# Patient Record
Sex: Male | Born: 1955
Health system: Southern US, Community
[De-identification: ages and names within clinical notes are randomized; demographics above are authoritative.]

## PROBLEM LIST (undated history)

## (undated) DIAGNOSIS — D369 Benign neoplasm, unspecified site: Secondary | ICD-10-CM

## (undated) DIAGNOSIS — I10 Essential (primary) hypertension: Secondary | ICD-10-CM

## (undated) DIAGNOSIS — Z8669 Personal history of other diseases of the nervous system and sense organs: Secondary | ICD-10-CM

## (undated) DIAGNOSIS — L259 Unspecified contact dermatitis, unspecified cause: Secondary | ICD-10-CM

## (undated) DIAGNOSIS — G4733 Obstructive sleep apnea (adult) (pediatric): Secondary | ICD-10-CM

## (undated) DIAGNOSIS — E739 Lactose intolerance, unspecified: Secondary | ICD-10-CM

## (undated) DIAGNOSIS — E538 Deficiency of other specified B group vitamins: Secondary | ICD-10-CM

## (undated) DIAGNOSIS — M5417 Radiculopathy, lumbosacral region: Secondary | ICD-10-CM

## (undated) DIAGNOSIS — Z9109 Other allergy status, other than to drugs and biological substances: Secondary | ICD-10-CM

## (undated) DIAGNOSIS — K760 Fatty (change of) liver, not elsewhere classified: Secondary | ICD-10-CM

## (undated) DIAGNOSIS — T4145XA Adverse effect of unspecified anesthetic, initial encounter: Secondary | ICD-10-CM

## (undated) DIAGNOSIS — H269 Unspecified cataract: Secondary | ICD-10-CM

## (undated) DIAGNOSIS — T8859XA Other complications of anesthesia, initial encounter: Secondary | ICD-10-CM

## (undated) DIAGNOSIS — I2510 Atherosclerotic heart disease of native coronary artery without angina pectoris: Secondary | ICD-10-CM

## (undated) DIAGNOSIS — R7303 Prediabetes: Secondary | ICD-10-CM

## (undated) DIAGNOSIS — I471 Supraventricular tachycardia: Secondary | ICD-10-CM

## (undated) DIAGNOSIS — J45909 Unspecified asthma, uncomplicated: Secondary | ICD-10-CM

## (undated) DIAGNOSIS — K5790 Diverticulosis of intestine, part unspecified, without perforation or abscess without bleeding: Secondary | ICD-10-CM

## (undated) DIAGNOSIS — E663 Overweight: Secondary | ICD-10-CM

## (undated) DIAGNOSIS — K225 Diverticulum of esophagus, acquired: Secondary | ICD-10-CM

## (undated) DIAGNOSIS — I714 Abdominal aortic aneurysm, without rupture: Secondary | ICD-10-CM

## (undated) DIAGNOSIS — M4800 Spinal stenosis, site unspecified: Secondary | ICD-10-CM

## (undated) DIAGNOSIS — J449 Chronic obstructive pulmonary disease, unspecified: Secondary | ICD-10-CM

## (undated) DIAGNOSIS — S8262XA Displaced fracture of lateral malleolus of left fibula, initial encounter for closed fracture: Secondary | ICD-10-CM

## (undated) DIAGNOSIS — Z789 Other specified health status: Secondary | ICD-10-CM

## (undated) DIAGNOSIS — C679 Malignant neoplasm of bladder, unspecified: Secondary | ICD-10-CM

## (undated) DIAGNOSIS — E118 Type 2 diabetes mellitus with unspecified complications: Secondary | ICD-10-CM

## (undated) DIAGNOSIS — R519 Headache, unspecified: Secondary | ICD-10-CM

## (undated) DIAGNOSIS — R51 Headache: Secondary | ICD-10-CM

## (undated) HISTORY — DX: Deficiency of other specified B group vitamins: E53.8

## (undated) HISTORY — DX: Type 2 diabetes mellitus with unspecified complications: E11.8

## (undated) HISTORY — DX: Diverticulum of esophagus, acquired: K22.5

## (undated) HISTORY — DX: Personal history of other diseases of the nervous system and sense organs: Z86.69

## (undated) HISTORY — DX: Displaced fracture of lateral malleolus of left fibula, initial encounter for closed fracture: S82.62XA

## (undated) HISTORY — DX: Unspecified asthma, uncomplicated: J45.909

## (undated) HISTORY — PX: VASECTOMY: SHX75

## (undated) HISTORY — DX: Unspecified contact dermatitis, unspecified cause: L25.9

## (undated) HISTORY — DX: Obstructive sleep apnea (adult) (pediatric): G47.33

## (undated) HISTORY — DX: Abdominal aortic aneurysm, without rupture: I71.4

## (undated) HISTORY — PX: CATARACT EXTRACTION W/ INTRAOCULAR LENS IMPLANT: SHX1309

## (undated) HISTORY — DX: Lactose intolerance, unspecified: E73.9

## (undated) HISTORY — DX: Other allergy status, other than to drugs and biological substances: Z91.09

## (undated) HISTORY — PX: EPIDURAL BLOCK INJECTION: SHX1516

## (undated) HISTORY — DX: Prediabetes: R73.03

## (undated) HISTORY — DX: Chronic obstructive pulmonary disease, unspecified: J44.9

## (undated) HISTORY — DX: Overweight: E66.3

## (undated) HISTORY — DX: Atherosclerotic heart disease of native coronary artery without angina pectoris: I25.10

## (undated) HISTORY — DX: Spinal stenosis, site unspecified: M48.00

## (undated) HISTORY — DX: Benign neoplasm, unspecified site: D36.9

## (undated) HISTORY — DX: Diverticulosis of intestine, part unspecified, without perforation or abscess without bleeding: K57.90

## (undated) HISTORY — DX: Supraventricular tachycardia: I47.1

## (undated) HISTORY — DX: Fatty (change of) liver, not elsewhere classified: K76.0

## (undated) HISTORY — DX: Radiculopathy, lumbosacral region: M54.17

## (undated) HISTORY — DX: Unspecified cataract: H26.9

## (undated) HISTORY — DX: Malignant neoplasm of bladder, unspecified: C67.9

## (undated) HISTORY — DX: Other specified health status: Z78.9

## (undated) HISTORY — PX: REPLACEMENT TOTAL HIP W/  RESURFACING IMPLANTS: SUR1222

---
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
## 2004-04-25 ENCOUNTER — Emergency Department: Payer: Self-pay | Admitting: Emergency Medicine

## 2008-07-26 ENCOUNTER — Ambulatory Visit: Payer: Self-pay | Admitting: Urology

## 2008-08-15 ENCOUNTER — Ambulatory Visit: Payer: Self-pay

## 2008-08-16 DIAGNOSIS — C679 Malignant neoplasm of bladder, unspecified: Secondary | ICD-10-CM

## 2008-08-16 HISTORY — PX: CYSTOSCOPY: SUR368

## 2008-08-16 HISTORY — DX: Malignant neoplasm of bladder, unspecified: C67.9

## 2009-05-15 ENCOUNTER — Encounter (INDEPENDENT_AMBULATORY_CARE_PROVIDER_SITE_OTHER): Payer: Self-pay | Admitting: *Deleted

## 2009-06-10 ENCOUNTER — Encounter (INDEPENDENT_AMBULATORY_CARE_PROVIDER_SITE_OTHER): Payer: Self-pay

## 2009-06-11 ENCOUNTER — Ambulatory Visit: Payer: Self-pay | Admitting: Gastroenterology

## 2009-06-11 DIAGNOSIS — D369 Benign neoplasm, unspecified site: Secondary | ICD-10-CM

## 2009-06-11 HISTORY — DX: Benign neoplasm, unspecified site: D36.9

## 2009-06-23 ENCOUNTER — Ambulatory Visit: Payer: Self-pay | Admitting: Gastroenterology

## 2009-06-25 ENCOUNTER — Encounter: Payer: Self-pay | Admitting: Gastroenterology

## 2011-01-15 ENCOUNTER — Other Ambulatory Visit: Payer: Self-pay | Admitting: Otolaryngology

## 2011-01-15 DIAGNOSIS — H9209 Otalgia, unspecified ear: Secondary | ICD-10-CM

## 2011-01-15 DIAGNOSIS — M542 Cervicalgia: Secondary | ICD-10-CM

## 2011-01-19 ENCOUNTER — Ambulatory Visit
Admission: RE | Admit: 2011-01-19 | Discharge: 2011-01-19 | Disposition: A | Discharge status: Home / Self Care | Payer: BC Managed Care – PPO | Source: Ambulatory Visit | Attending: Otolaryngology | Admitting: Otolaryngology

## 2011-01-19 DIAGNOSIS — H9209 Otalgia, unspecified ear: Secondary | ICD-10-CM

## 2011-01-19 DIAGNOSIS — M542 Cervicalgia: Secondary | ICD-10-CM

## 2011-01-19 MED ORDER — IOHEXOL 300 MG/ML  SOLN
75.0000 mL | Freq: Once | INTRAMUSCULAR | Status: AC | PRN
  Administered 2011-01-19: 75 mL via INTRAVENOUS

## 2011-02-03 ENCOUNTER — Other Ambulatory Visit: Payer: Self-pay | Admitting: Otolaryngology

## 2011-07-13 DIAGNOSIS — K5790 Diverticulosis of intestine, part unspecified, without perforation or abscess without bleeding: Secondary | ICD-10-CM

## 2011-07-13 HISTORY — DX: Diverticulosis of intestine, part unspecified, without perforation or abscess without bleeding: K57.90

## 2012-01-04 LAB — TSH: TSH: 1.591

## 2012-01-05 LAB — LIPID PANEL
Cholesterol, Total: 170
Direct LDL: 105
HDL: 37 mg/dL (ref 35–70)
Triglycerides: 141

## 2012-02-23 ENCOUNTER — Telehealth: Payer: Self-pay | Admitting: Gastroenterology

## 2012-02-23 NOTE — Telephone Encounter (Signed)
Pt has hemorrhoids and has been doing a lot of heavy lifting and now has BRB independent of stool.  No other GI symptoms.  Pt due for colon 06/2012.  Last colon 06/2009 Two colon polyps, both removed and sent to pathology (one was >1cm) 2) Moderate diverticulosis throughout colon, most significant in left colon 3) Otherwise normal examination No blood this morning last episode was last night.  Pt has been advised to do sitz baths as often as possible, use tucks pads, and prep H.  Pt was also advised to increase fiber and fluids.  He was given appt for 03/24/12 with Dr Christella Hartigan.  He will call if bleeding continues or worsens.  He will call if he develops dizziness,abd pain, or fever.  I will forward to Dr Christella Hartigan for further recommendations.

## 2012-02-23 NOTE — Telephone Encounter (Signed)
i agree, thanks 

## 2012-03-12 DIAGNOSIS — M5417 Radiculopathy, lumbosacral region: Secondary | ICD-10-CM

## 2012-03-12 HISTORY — DX: Radiculopathy, lumbosacral region: M54.17

## 2012-03-24 ENCOUNTER — Encounter: Payer: Self-pay | Admitting: Gastroenterology

## 2012-03-24 ENCOUNTER — Ambulatory Visit (INDEPENDENT_AMBULATORY_CARE_PROVIDER_SITE_OTHER): Payer: BC Managed Care – PPO | Admitting: Gastroenterology

## 2012-03-24 ENCOUNTER — Other Ambulatory Visit (INDEPENDENT_AMBULATORY_CARE_PROVIDER_SITE_OTHER): Payer: BC Managed Care – PPO

## 2012-03-24 ENCOUNTER — Inpatient Hospital Stay (HOSPITAL_COMMUNITY)
Admission: AD | Admit: 2012-03-24 | Discharge: 2012-03-26 | DRG: 111 | Disposition: A | Discharge status: Home / Self Care | Payer: BC Managed Care – PPO | Source: Ambulatory Visit | Attending: Internal Medicine | Admitting: Internal Medicine

## 2012-03-24 ENCOUNTER — Encounter (HOSPITAL_COMMUNITY): Payer: Self-pay | Admitting: Family Medicine

## 2012-03-24 ENCOUNTER — Ambulatory Visit (INDEPENDENT_AMBULATORY_CARE_PROVIDER_SITE_OTHER)
Admission: RE | Admit: 2012-03-24 | Discharge: 2012-03-24 | Disposition: A | Discharge status: Home / Self Care | Payer: BC Managed Care – PPO | Source: Ambulatory Visit | Attending: Gastroenterology | Admitting: Gastroenterology

## 2012-03-24 VITALS — BP 106/72 | HR 64 | Ht 70.0 in | Wt 186.0 lb

## 2012-03-24 DIAGNOSIS — I739 Peripheral vascular disease, unspecified: Secondary | ICD-10-CM | POA: Diagnosis present

## 2012-03-24 DIAGNOSIS — R109 Unspecified abdominal pain: Secondary | ICD-10-CM

## 2012-03-24 DIAGNOSIS — I719 Aortic aneurysm of unspecified site, without rupture: Secondary | ICD-10-CM

## 2012-03-24 DIAGNOSIS — Z8571 Personal history of Hodgkin lymphoma: Secondary | ICD-10-CM

## 2012-03-24 DIAGNOSIS — D369 Benign neoplasm, unspecified site: Secondary | ICD-10-CM

## 2012-03-24 DIAGNOSIS — K649 Unspecified hemorrhoids: Secondary | ICD-10-CM | POA: Diagnosis present

## 2012-03-24 DIAGNOSIS — K625 Hemorrhage of anus and rectum: Secondary | ICD-10-CM

## 2012-03-24 DIAGNOSIS — Z720 Tobacco use: Secondary | ICD-10-CM

## 2012-03-24 DIAGNOSIS — R634 Abnormal weight loss: Secondary | ICD-10-CM

## 2012-03-24 DIAGNOSIS — I714 Abdominal aortic aneurysm, without rupture, unspecified: Principal | ICD-10-CM | POA: Diagnosis present

## 2012-03-24 DIAGNOSIS — I10 Essential (primary) hypertension: Secondary | ICD-10-CM | POA: Diagnosis present

## 2012-03-24 DIAGNOSIS — Z87891 Personal history of nicotine dependence: Secondary | ICD-10-CM

## 2012-03-24 DIAGNOSIS — K5730 Diverticulosis of large intestine without perforation or abscess without bleeding: Secondary | ICD-10-CM | POA: Diagnosis present

## 2012-03-24 DIAGNOSIS — F17200 Nicotine dependence, unspecified, uncomplicated: Secondary | ICD-10-CM | POA: Diagnosis present

## 2012-03-24 DIAGNOSIS — Z8551 Personal history of malignant neoplasm of bladder: Secondary | ICD-10-CM

## 2012-03-24 DIAGNOSIS — R1013 Epigastric pain: Secondary | ICD-10-CM

## 2012-03-24 DIAGNOSIS — Z79899 Other long term (current) drug therapy: Secondary | ICD-10-CM

## 2012-03-24 HISTORY — DX: Essential (primary) hypertension: I10

## 2012-03-24 LAB — CBC WITH DIFFERENTIAL/PLATELET
Basophils Relative: 0.4 % (ref 0.0–3.0)
Eosinophils Absolute: 0.3 10*3/uL (ref 0.0–0.7)
Eosinophils Relative: 3.1 % (ref 0.0–5.0)
HCT: 47.4 % (ref 39.0–52.0)
Hemoglobin: 15.9 g/dL (ref 13.0–17.0)
Lymphs Abs: 2.8 10*3/uL (ref 0.7–4.0)
MCHC: 33.5 g/dL (ref 30.0–36.0)
MCV: 95.7 fl (ref 78.0–100.0)
Monocytes Absolute: 0.9 10*3/uL (ref 0.1–1.0)
Neutro Abs: 7.2 10*3/uL (ref 1.4–7.7)
RBC: 4.96 Mil/uL (ref 4.22–5.81)
WBC: 11.4 10*3/uL — ABNORMAL HIGH (ref 4.5–10.5)

## 2012-03-24 LAB — COMPREHENSIVE METABOLIC PANEL
AST: 17 U/L (ref 0–37)
Albumin: 4.1 g/dL (ref 3.5–5.2)
Alkaline Phosphatase: 82 U/L (ref 39–117)
BUN: 21 mg/dL (ref 6–23)
Creatinine, Ser: 0.7 mg/dL (ref 0.4–1.5)
Glucose, Bld: 97 mg/dL (ref 70–99)

## 2012-03-24 MED ORDER — ZOLPIDEM TARTRATE 5 MG PO TABS: 5.0000 mg | ORAL_TABLET | Freq: Every evening | ORAL | Status: DC | PRN

## 2012-03-24 MED ORDER — SODIUM CHLORIDE 0.9 % IJ SOLN
3.0000 mL | Freq: Two times a day (BID) | INTRAMUSCULAR | Status: DC
  Administered 2012-03-24 – 2012-03-26 (×3): 3 mL via INTRAVENOUS

## 2012-03-24 MED ORDER — HYDROCHLOROTHIAZIDE 25 MG PO TABS
25.0000 mg | ORAL_TABLET | Freq: Every day | ORAL | Status: DC
  Filled 2012-03-24 (×2): qty 1

## 2012-03-24 MED ORDER — ONDANSETRON HCL 4 MG/2ML IJ SOLN
4.0000 mg | Freq: Four times a day (QID) | INTRAMUSCULAR | Status: DC | PRN
  Administered 2012-03-26: 4 mg via INTRAVENOUS
  Filled 2012-03-24: qty 2

## 2012-03-24 MED ORDER — PANTOPRAZOLE SODIUM 40 MG PO TBEC
40.0000 mg | DELAYED_RELEASE_TABLET | Freq: Every day | ORAL | Status: DC
Start: 2012-03-25 — End: 2012-03-25
  Filled 2012-03-24: qty 1

## 2012-03-24 MED ORDER — SODIUM CHLORIDE 0.9 % IV SOLN
INTRAVENOUS | Status: DC
  Administered 2012-03-25 (×2): via INTRAVENOUS

## 2012-03-24 MED ORDER — SODIUM CHLORIDE 0.9 % IJ SOLN: 3.0000 mL | INTRAMUSCULAR | Status: DC | PRN

## 2012-03-24 MED ORDER — ALUM & MAG HYDROXIDE-SIMETH 200-200-20 MG/5ML PO SUSP
30.0000 mL | Freq: Four times a day (QID) | ORAL | Status: DC | PRN
  Administered 2012-03-25: 30 mL via ORAL
  Filled 2012-03-24: qty 30

## 2012-03-24 MED ORDER — SODIUM CHLORIDE 0.9 % IV SOLN: 250.0000 mL | INTRAVENOUS | Status: DC | PRN

## 2012-03-24 MED ORDER — SODIUM CHLORIDE 0.9 % IJ SOLN
3.0000 mL | Freq: Two times a day (BID) | INTRAMUSCULAR | Status: DC
  Administered 2012-03-24 – 2012-03-26 (×2): 3 mL via INTRAVENOUS

## 2012-03-24 MED ORDER — HYDROCODONE-ACETAMINOPHEN 5-325 MG PO TABS: 1.0000 | ORAL_TABLET | ORAL | Status: DC | PRN

## 2012-03-24 MED ORDER — DEXLANSOPRAZOLE 60 MG PO CPDR: 60.0000 mg | DELAYED_RELEASE_CAPSULE | Freq: Every day | ORAL | Status: DC

## 2012-03-24 MED ORDER — CEFAZOLIN SODIUM-DEXTROSE 2-3 GM-% IV SOLR
2.0000 g | INTRAVENOUS | Status: AC
  Administered 2012-03-25: 2 g via INTRAVENOUS
  Filled 2012-03-24: qty 50

## 2012-03-24 MED ORDER — ACETAMINOPHEN 650 MG RE SUPP: 650.0000 mg | Freq: Four times a day (QID) | RECTAL | Status: DC | PRN

## 2012-03-24 MED ORDER — ONDANSETRON HCL 4 MG PO TABS: 4.0000 mg | ORAL_TABLET | Freq: Four times a day (QID) | ORAL | Status: DC | PRN

## 2012-03-24 MED ORDER — MORPHINE SULFATE 2 MG/ML IJ SOLN: 1.0000 mg | INTRAMUSCULAR | Status: DC | PRN

## 2012-03-24 MED ORDER — LISINOPRIL 20 MG PO TABS
20.0000 mg | ORAL_TABLET | Freq: Every day | ORAL | Status: DC
  Filled 2012-03-24 (×2): qty 1

## 2012-03-24 MED ORDER — IOHEXOL 300 MG/ML  SOLN
100.0000 mL | Freq: Once | INTRAMUSCULAR | Status: AC | PRN
  Administered 2012-03-24: 100 mL via INTRAVENOUS

## 2012-03-24 MED ORDER — SENNOSIDES-DOCUSATE SODIUM 8.6-50 MG PO TABS
1.0000 | ORAL_TABLET | Freq: Every evening | ORAL | Status: DC | PRN
  Filled 2012-03-24: qty 1

## 2012-03-24 MED ORDER — LISINOPRIL-HYDROCHLOROTHIAZIDE 20-25 MG PO TABS: 1.0000 | ORAL_TABLET | Freq: Every day | ORAL | Status: DC

## 2012-03-24 MED ORDER — NICOTINE 14 MG/24HR TD PT24
14.0000 mg | MEDICATED_PATCH | Freq: Every day | TRANSDERMAL | Status: DC
  Filled 2012-03-24 (×3): qty 1

## 2012-03-24 MED ORDER — ACETAMINOPHEN 325 MG PO TABS: 650.0000 mg | ORAL_TABLET | Freq: Four times a day (QID) | ORAL | Status: DC | PRN

## 2012-03-24 NOTE — Progress Notes (Signed)
Review of pertinent gastrointestinal problems: 1. Adenomatous polyps removed colonoscopy 06/2009; one was 11mm, recommended repeat colonoscopy at 3 year interval  HPI: This is a  very pleasant 56 year old man who is here with his wife today. I last saw him about 2-1/2 years ago the time of a colonoscopy. See those results above.  He has hemorrhoids, externally.  These will bleeding periodically.  H  Has been having abdominal pains, epigastric to back and RUQ.  The pains interrupting sleep even. Has tried OTC H2 blocker, helped a bit but not very well.  Has lost 30 pounds in 2-3 months.    Started lisinopril 2-3 months ago.  Takes BCs 6-8 a day, for years but stopped a week ago.    Non etoh drinker.    Has not had GB surgery.   Past Medical History  Diagnosis Date  . Colon polyps   . Bladder cancer     Past Surgical History  Procedure Date  . Tonsillectomy and adenoidectomy   . Vasectomy   . Bladder cancer     Current Outpatient Prescriptions  Medication Sig Dispense Refill  . Aspirin-Salicylamide-Caffeine (BC HEADACHE POWDER PO) Take by mouth as needed.      [name] lisinopril-hydrochlorothiazide (PRINZIDE,ZESTORETIC) 20-25 MG per tablet Take 1 tablet by mouth daily.      . Multiple Vitamins-Minerals (CENTRUM SILVER ADULT 50+ PO) Take 1 capsule by mouth daily.      . Glucosamine-Chondroitin-Ca-D3 (TRIPLE FLEX 50+ PO) Take 1 capsule by mouth daily.        Allergies as of 03/24/2012 - Review Complete 03/24/2012  Allergen Reaction Noted  . Codeine  03/24/2012    Family History  Problem Relation Age of Onset  . Diabetes Mother   . Heart disease Mother   . COPD Mother   . Cancer Mother     Bladder  . Diabetes Father   . Heart disease Father   . Dementia Father     History   Social History  . Marital Status: Married    Spouse Name: N/A    Number of Children: 2  . Years of Education: N/A   Occupational History  . Self Employed    Social History Main Topics  .  Smoking status: Current Every Day Smoker  . Smokeless tobacco: Never Used  . Alcohol Use: Yes  . Drug Use: No  . Sexually Active: Not on file   Other Topics Concern  . Not on file   Social History Narrative  . No narrative on file      Physical Exam: BP 106/72  Pulse 64  Ht 5\' 10"  (1.778 m)  Wt 186 lb (84.369 kg)  BMI 26.69 kg/m2 Constitutional: generally well-appearing Psychiatric: alert and oriented x3 Abdomen: soft, nontender, nondistended, no obvious ascites, no peritoneal signs, normal bowel sounds     Assessment and plan: 56 y.o. male with minor rectal bleeding, significant abdominal pain that radiates to back, unintentional weight loss  I am most concerned about the abdominal pain and unintentional weight loss and the possibility that he has an underlying neoplasm. Other possibility is peptic ulcer disease given his very high NSAID use.   I am setting him up with an abdominal, pelvic CAT scan with IV and oral contrast. He will also get a basic set of labs today including a CBC and complete metabolic profile. I have given him samples of proton pump inhibitor in case this is simply peptic related. He'll take one pill once daily. If the  CT scan doesn't show a clear etiology of his abdominal pain, weight loss then I would proceed with EGD and at the same time a colonoscopy for polyp surveillance, minor rectal bleeding that really does sound hemorrhoidal.

## 2012-03-24 NOTE — Patient Instructions (Addendum)
One of your biggest health concerns is your smoking.  This increases your risk for most cancers and serious cardiovascular diseases such as strokes, heart attacks.  You should try your best to stop.  If you need assistance, please contact your PCP or Smoking Cessation Class at [facility] [phone]) or [facility] Quit-Line (1-800-QUIT-NOW). You will be set up for a CT scan of abdomen and pelvis with IV and oral contrast for abdominal pain, weight loss.   You have been scheduled for a CT scan of the abdomen and pelvis at [facility] ([address]---this is in the same building as Architectural technologist).   You are scheduled on TODAY at 430 pm. You should arrive 15 minutes prior to your appointment time for registration. Please follow the written instructions below on the day of your exam:  WARNING: IF YOU ARE ALLERGIC TO IODINE/X-RAY DYE, PLEASE NOTIFY RADIOLOGY IMMEDIATELY AT [phone]! YOU WILL BE GIVEN A 13 HOUR PREMEDICATION PREP.  1) Do not eat or drink anything after NOON (4 hours prior to your test) 2) You have been given 2 bottles of oral contrast to drink. The solution may taste better if refrigerated, but do NOT add ice or any other liquid to this solution. Shake well before drinking.    Drink 1 bottle of contrast @ 230 pm (2 hours prior to your exam)  Drink 1 bottle of contrast @ 330 pm  (1 hour prior to your exam)   The purpose of you drinking the oral contrast is to aid in the visualization of your intestinal tract. The contrast solution may cause some diarrhea. Before your exam is started, you will be given a small amount of fluid to drink. Depending on your individual set of symptoms, you may also receive an intravenous injection of x-ray contrast/dye. Plan on being at [facility] for 30 minutes or long, depending on the type of exam you are having performed.  If you have any questions regarding your exam or if you need to reschedule, you may call the CT  department at [phone] between the hours of 8:00 am and 5:00 pm, Monday-Friday.  ________________________________________________________________________  Bonita Quin will have labs checked today in the basement lab.  Please head down after you check out with the front desk  (cbc, cmet). If the CT scan is unrevealing then you will need EGD and at the same time would proceed with colonoscopy for rectal bleeding, polyp surveillance. Take tylenol 2 extra strength pills twice daily for headache and abdominal pains. Continue to avoid NSAIDs. Take one antiacid medicine once daily, samples given.  Best taken 20-30 min prior to a meal.

## 2012-03-24 NOTE — Consult Note (Signed)
[facility]  Consult Note  Patient name: Carl Lee MRN: [id number] DOB: 11/05/1955 Sex: male  Consulting Physician: Dr. [name]  Reason for Consult: No chief complaint on file.   HISTORY OF PRESENT ILLNESS:  This is a very pleasant 56-year-old gentleman that I was asked to evaluate for an abdominal aortic aneurysm. The patient states that he has been having abdominal pain which radiates to his back for the past several weeks. He has no exacerbating or alleviating factors. He is also had a 30 pound weight loss. He was sent for a CT scan earlier today to rule out the possibility of a underlying neoplasm given his symptoms. The CT scan revealed a 5.2 cm infrarenal abdominal aortic aneurysm without evidence of rupture. He was admitted to the hospital for further evaluation and management.  The patient is medically managed for his hypertension. He began taking an ACE inhibitor 2-3 months ago. He also has a history of bladder cancer. He continues to be a smoker, but is going to quit on his birthday in a few days. He has a grandfather that had a AAA.  Past Medical History   Diagnosis  Date   .  Colon polyps    .  Bladder cancer     Past Surgical History   Procedure  Date   .  Tonsillectomy and adenoidectomy    .  Vasectomy    .  Bladder cancer     History    Social History   .  Marital Status:  Married     Spouse Name:  N/A     Number of Children:  2   .  Years of Education:  N/A    Occupational History   .  Self Employed     Social History Main Topics   .  Smoking status:  Current Every Day Smoker   .  Smokeless tobacco:  Never Used   .  Alcohol Use:  Yes   .  Drug Use:  No   .  Sexually Active:  Not on file    Other Topics  Concern   .  Not on file    Social History Narrative   .  No narrative on file    Family History   Problem  Relation  Age of Onset   .  Diabetes  Mother    .  Heart disease  Mother    .  COPD  Mother    .  Cancer  Mother        Bladder    .  Diabetes  Father    .  Heart disease  Father    .  Dementia  Father     Allergies as of 03/24/2012 - Review Complete 03/24/2012   Allergen  Reaction  Noted   .  Codeine   03/24/2012    Current Facility-Administered Medications on File Prior to Encounter   Medication  Dose  Route  Frequency  Provider  Last Rate  Last Dose   .  iohexol (OMNIPAQUE) 300 MG/ML solution 100 mL  100 mL  Intravenous  Once PRN  Medication Radiologist, MD   100 mL at 03/24/12 1640    Current Outpatient Prescriptions on File Prior to Encounter   Medication  Sig  Dispense  Refill   .  Aspirin-Salicylamide-Caffeine (BC HEADACHE POWDER PO)  Take by mouth as needed.     .  dexlansoprazole (DEXILANT) 60 MG capsule  Take 1 capsule (  60 mg total) by mouth daily.  20 capsule  0   .  Glucosamine-Chondroitin-Ca-D3 (TRIPLE FLEX 50+ PO)  Take 1 capsule by mouth daily.     .  lisinopril-hydrochlorothiazide (PRINZIDE,ZESTORETIC) 20-25 MG per tablet  Take 1 tablet by mouth daily.     .  Multiple Vitamins-Minerals (CENTRUM SILVER ADULT 50+ PO)  Take 1 capsule by mouth daily.      REVIEW OF SYSTEMS:  Cardiovascular: No chest pain, chest pressure, palpitations, orthopnea, or dyspnea on exertion. No claudication or rest pain, No history of DVT or phlebitis.  Pulmonary: No productive cough, asthma or wheezing.  Neurologic: No weakness, paresthesias, aphasia, or amaurosis. No dizziness.  Hematologic: No bleeding problems or clotting disorders.  Musculoskeletal: No joint pain or joint swelling.  Gastrointestinal: Epigastric abdominal pain that radiates to his back and right upper quadrant. Periodic bleeding from external hemorrhoids  Genitourinary: No dysuria or hematuria.  Psychiatric:: No history of major depression.  Integumentary: No rashes or ulcers.  Constitutional: No fever or chills.  PHYSICAL EXAMINATION:  General: The patient appears their stated age. Vital signs are BP 150/89  Pulse 79  Temp 97.9  F (36.6 C) (Oral)  Resp 20  Ht 5' 10" (1.778 m)  Wt 184 lb 3.2 oz (83.553 kg)  BMI 26.43 kg/m2  SpO2 98%  Pulmonary: Respirations are non-labored  HEENT: No gross abnormalities  Abdomen: Soft and non-tender, but upon palpation of his aorta, he has significant pain  Musculoskeletal: There are no major deformities.  Neurologic: No focal weakness or paresthesias are detected,  Skin: There are no ulcer or rashes noted.  Psychiatric: The patient has normal affect.  Cardiovascular: There is a regular rate and rhythm without significant murmur appreciated. Palpable femoral pulses  Diagnostic Studies:  I have reviewed his CT scan which shows a 5.2 cm infrarenal abdominal aortic aneurysm  Assessment:  Likely Symptomatic abdominal aortic aneurysm  Plan:  I had a long discussion with the patient and family regarding his condition. While there is no way to know for sure that his pain is due to his aneurysm, given that he is very tender to palpation of his aneurysm, I have recommended endovascular repair to prevent rupture. I feel that this should be done urgently, and I have scheduled it for tomorrow morning. I discussed the risks which include but are not limited to bowel ischemia, lower extremity ischemia, bleeding, and cardio-pulmonary complications. I diagrammed out the repair, and all of their questions were answered. Total time spent reviewing the chart and CT scan and discussing it with the patient was > 90 min.    V. Wells Brabham IV, M.D.  [facility]  Office: [phone]  Pager: [phone]  

## 2012-03-24 NOTE — H&P (Signed)
PCP:   Raliegh Ip, MD   Chief Complaint:  Epigastric pain, abnormal CT abdomen  HPI: This is a 56 year old gentleman who has been having epigastric pain which radiates to her back for approximately 4 weeks. He denies any nausea, vomiting, or diarrhea. He does have some occasional bright red blood in the stool which has been attributed to hemorrhoids. Patient had a colonoscopy approximately 2-1/2 years ago. The patient does have frequent migraines and uses 6-8 BCG powder daily for the past 30 years. He reports approximately 30 pound weight loss over the past 3 months. The patient does have a history of bladder cancer, in remission, with a recent visits to his urologist. The patient saw his gastroenterologist Dr. Christella Hartigan who sent him for a CT abdomen and pelvis which revealed to A. aortic aneurysm, he was sent in for evaluation by the surgeon. History provided by the patient and family members at bedside.  Review of Systems:  The patient denies anorexia, fever, vision loss, decreased hearing, hoarseness, chest pain, syncope, dyspnea on exertion, peripheral edema, balance deficits, hemoptysis, melena, hematochezia, hematuria, incontinence, genital sores, muscle weakness, suspicious skin lesions, transient blindness, difficulty walking, depression, enlarged lymph nodes, angioedema, and breast masses.  Past Medical History: Past Medical History  Diagnosis Date  . Colon polyps   . Bladder cancer   . Hypertension    Past Surgical History  Procedure Date  . Tonsillectomy and adenoidectomy   . Vasectomy   . Bladder cancer     Medications: Prior to Admission medications   Medication Sig Start Date End Date Taking? Authorizing Provider  Aspirin-Salicylamide-Caffeine (BC HEADACHE POWDER PO) Take by mouth as needed.    Historical Provider, MD  dexlansoprazole (DEXILANT) 60 MG capsule Take 1 capsule (60 mg total) by mouth daily. 03/24/12 04/23/12  Rachael Fee, MD    Glucosamine-Chondroitin-Ca-D3 (TRIPLE FLEX 50+ PO) Take 1 capsule by mouth daily.    Historical Provider, MD  lisinopril-hydrochlorothiazide (PRINZIDE,ZESTORETIC) 20-25 MG per tablet Take 1 tablet by mouth daily.    Historical Provider, MD  Multiple Vitamins-Minerals (CENTRUM SILVER ADULT 50+ PO) Take 1 capsule by mouth daily.    Historical Provider, MD    Allergies:   Allergies  Allergen Reactions  . Codeine     Nightmares    Social History:  reports that he has been smoking.  He has never used smokeless tobacco. He reports that he drinks alcohol. He reports that he does not use illicit drugs.  Family History: Family History  Problem Relation Age of Onset  . Diabetes Mother   . Heart disease Mother   . COPD Mother   . Cancer Mother     Bladder  . Diabetes Father   . Heart disease Father   . Dementia Father     Physical Exam: Filed Vitals:   03/24/12 2002  BP: 150/89  Pulse: 79  Temp: 97.9 F (36.6 C)  TempSrc: Oral  Resp: 20  Height: 5\' 10"  (1.778 m)  Weight: 83.553 kg (184 lb 3.2 oz)  SpO2: 98%    General:  Alert and oriented times three, well developed and nourished, no acute distress Eyes: PERRLA, pink conjunctiva, no scleral icterus ENT: Moist oral mucosa, neck supple, no thyromegaly Lungs: clear to ascultation, no wheeze, no crackles, no use of accessory muscles Cardiovascular: regular rate and rhythm, no regurgitation, no gallops, no murmurs. No carotid bruits, no JVD Abdomen: soft, positive BS, positive tenderness to palpation epigastric region, non-distended, no organomegaly, not an acute abdomen GU:  not examined Neuro: CN II - XII grossly intact, sensation intact Musculoskeletal: strength 5/5 all extremities, no clubbing, cyanosis or edema Skin: no rash, no subcutaneous crepitation, no decubitus Psych: appropriate patient   Labs on Admission:   [facility] 03/24/12 1123  NA 137  K 3.8  CL 100  CO2 26  GLUCOSE 97  BUN 21  CREATININE 0.7   CALCIUM 9.4  MG --  PHOS --    Basename 03/24/12 1123  AST 17  ALT 11  ALKPHOS 82  BILITOT 0.9  PROT 7.0  ALBUMIN 4.1   No results found for this basename: LIPASE:2,AMYLASE:2 in the last 72 hours  Basename 03/24/12 1123  WBC 11.4*  NEUTROABS 7.2  HGB 15.9  HCT 47.4  MCV 95.7  PLT 323.0   No results found for this basename: CKTOTAL:3,CKMB:3,CKMBINDEX:3,TROPONINI:3 in the last 72 hours No components found with this basename: POCBNP:3 No results found for this basename: DDIMER:2 in the last 72 hours No results found for this basename: HGBA1C:2 in the last 72 hours No results found for this basename: CHOL:2,HDL:2,LDLCALC:2,TRIG:2,CHOLHDL:2,LDLDIRECT:2 in the last 72 hours No results found for this basename: TSH,T4TOTAL,FREET3,T3FREE,THYROIDAB in the last 72 hours No results found for this basename: VITAMINB12:2,FOLATE:2,FERRITIN:2,TIBC:2,IRON:2,RETICCTPCT:2 in the last 72 hours  Micro Results: No results found for this or any previous visit (from the past 240 hour(s)).   Radiological Exams on Admission: Ct Abdomen Pelvis W Contrast  03/24/2012  *RADIOLOGY REPORT*  Clinical Data: Abdominal pain.  30 pounds weight loss over past 2 months.  Lower GI bleeding.  Bladder carcinoma.  CT ABDOMEN AND PELVIS WITH CONTRAST  Technique:  Multidetector CT imaging of the abdomen and pelvis was performed following the standard protocol during bolus administration of intravenous contrast.  Contrast: OMNIPAQUE IOHEXOL 300 MG/ML  SOLN  Comparison: None.  Findings: The liver, gallbladder, pancreas, spleen, adrenal glands, and kidneys are normal in appearance, except for a tiny right renal cyst.  No evidence of hydronephrosis.  No soft tissue masses or lymphadenopathy identified within the abdomen or pelvis.  An infrarenal abdominal aortic aneurysm is seen which measures 5.2 cm in maximum diameter.  No evidence of aneurysm leak or rupture.  Severe diverticulosis is seen involving the descending  and sigmoid colon.  The distal colon is collapsed which makes evaluation of wall thickness difficult.  No definite peri colonic inflammatory changes identified.  No other inflammatory process or abnormal fluid collections are seen within the abdomen or pelvis.  No evidence of dilated bowel loops or hernia.  IMPRESSION:  1.  Severe sigmoid and descending colon diverticulosis.  No definite radiographic signs of diverticulitis or other acute findings. 2.  5.2 cm infrarenal abdominal aortic aneurysm.  No evidence of aneurysm leak or rupture.  Vascular surgery consultation is advised due to risk of increased risk of aneurysm rupture at this size.   Original Report Authenticated By: Danae Orleans, M.D.     Assessment/Plan Present on Admission:  . infrarenal Aortic aneurysm Admits to telemetry vascular surgeon Dr. Myra Gianotti consulted and aware N.p.o., IV fluid hydration Epigastric pain  Likely due to gastric ulcer. Patient is a PPI. Educated on using BCG powder  Consult GI in a.m. reposited EGD Adolph Pollack Tylenol when necessary pain Weight loss Tumor markers ordered, history of bladder cancer Tobacco abuse Nicotine patch ordered Hypertension Resume home medications Migraines For now will try Tylenol Diverticulosis with mild bright red blood per rectum Likely cause of patient's GI bleed, colonoscopy per GI.   Full code SCDs  for DVT prophylaxis    Matyas Baisley 03/24/2012, 9:22 PM

## 2012-03-25 ENCOUNTER — Inpatient Hospital Stay (HOSPITAL_COMMUNITY): Payer: BC Managed Care – PPO

## 2012-03-25 ENCOUNTER — Encounter (HOSPITAL_COMMUNITY): Payer: Self-pay | Admitting: Anesthesiology

## 2012-03-25 ENCOUNTER — Inpatient Hospital Stay (HOSPITAL_COMMUNITY): Payer: BC Managed Care – PPO | Admitting: Anesthesiology

## 2012-03-25 ENCOUNTER — Encounter (HOSPITAL_COMMUNITY)
Admission: AD | Disposition: A | Discharge status: Home / Self Care | Payer: Self-pay | Source: Ambulatory Visit | Attending: Internal Medicine

## 2012-03-25 DIAGNOSIS — I714 Abdominal aortic aneurysm, without rupture, unspecified: Secondary | ICD-10-CM

## 2012-03-25 HISTORY — PX: ABDOMINAL AORTIC ANEURYSM REPAIR: SUR1152

## 2012-03-25 HISTORY — DX: Abdominal aortic aneurysm, without rupture, unspecified: I71.40

## 2012-03-25 HISTORY — DX: Abdominal aortic aneurysm, without rupture: I71.4

## 2012-03-25 LAB — CBC
Hemoglobin: 14.8 g/dL (ref 13.0–17.0)
MCH: 32.3 pg (ref 26.0–34.0)
MCHC: 35.6 g/dL (ref 30.0–36.0)
MCV: 90.8 fL (ref 78.0–100.0)
Platelets: 220 10*3/uL (ref 150–400)
RBC: 4.58 MIL/uL (ref 4.22–5.81)
RBC: 4.16 MIL/uL — ABNORMAL LOW (ref 4.22–5.81)
RDW: 12.9 % (ref 11.5–15.5)
WBC: 10.2 10*3/uL (ref 4.0–10.5)

## 2012-03-25 LAB — COMPREHENSIVE METABOLIC PANEL
ALT: 5 U/L (ref 0–53)
AST: 11 U/L (ref 0–37)
Albumin: 3.5 g/dL (ref 3.5–5.2)
CO2: 29 mEq/L (ref 19–32)
Calcium: 9 mg/dL (ref 8.4–10.5)
Chloride: 102 mEq/L (ref 96–112)
Creatinine, Ser: 0.7 mg/dL (ref 0.50–1.35)
GFR calc non Af Amer: >90 mL/min (ref >90)
Sodium: 139 mEq/L (ref 135–145)

## 2012-03-25 LAB — PROTIME-INR
INR: 1.05 (ref 0.00–1.49)
Prothrombin Time: 13 seconds (ref 11.6–15.2)
Prothrombin Time: 13.9 seconds (ref 11.6–15.2)

## 2012-03-25 LAB — PREPARE RBC (CROSSMATCH)

## 2012-03-25 LAB — BASIC METABOLIC PANEL
Chloride: 106 mEq/L (ref 96–112)
GFR calc Af Amer: >90 mL/min (ref >90)
Potassium: 4.1 mEq/L (ref 3.5–5.1)

## 2012-03-25 LAB — SURGICAL PCR SCREEN
MRSA, PCR: POSITIVE — AB
Staphylococcus aureus: POSITIVE — AB

## 2012-03-25 LAB — ABO/RH: ABO/RH(D): A POS

## 2012-03-25 LAB — APTT: aPTT: 31 seconds (ref 24–37)

## 2012-03-25 LAB — PSA: PSA: 1.02 ng/mL (ref <=4.00)

## 2012-03-25 LAB — MAGNESIUM: Magnesium: 2 mg/dL (ref 1.5–2.5)

## 2012-03-25 LAB — CEA: CEA: 1.1 ng/mL (ref 0.0–5.0)

## 2012-03-25 SURGERY — INSERTION, ENDOVASCULAR STENT GRAFT, AORTA, ABDOMINAL
Anesthesia: General | Site: Abdomen | Wound class: Clean

## 2012-03-25 MED ORDER — PHENYLEPHRINE HCL 10 MG/ML IJ SOLN
10.0000 mg | INTRAVENOUS | Status: DC | PRN
  Administered 2012-03-25: 10 ug/min via INTRAVENOUS

## 2012-03-25 MED ORDER — SENNOSIDES-DOCUSATE SODIUM 8.6-50 MG PO TABS
1.0000 | ORAL_TABLET | Freq: Every evening | ORAL | Status: DC | PRN
  Filled 2012-03-25: qty 1

## 2012-03-25 MED ORDER — FENTANYL CITRATE 0.05 MG/ML IJ SOLN
INTRAMUSCULAR | Status: AC
  Administered 2012-03-25: 25 ug
  Filled 2012-03-25: qty 2

## 2012-03-25 MED ORDER — LACTATED RINGERS IV SOLN
INTRAVENOUS | Status: DC | PRN
  Administered 2012-03-25: 08:00:00 via INTRAVENOUS

## 2012-03-25 MED ORDER — SUFENTANIL CITRATE 50 MCG/ML IV SOLN
INTRAVENOUS | Status: DC | PRN
  Administered 2012-03-25: 10 ug via INTRAVENOUS
  Administered 2012-03-25: 5 ug via INTRAVENOUS
  Administered 2012-03-25: 15 ug via INTRAVENOUS
  Administered 2012-03-25 (×2): 10 ug via INTRAVENOUS

## 2012-03-25 MED ORDER — ALUM & MAG HYDROXIDE-SIMETH 200-200-20 MG/5ML PO SUSP: 15.0000 mL | ORAL | Status: DC | PRN

## 2012-03-25 MED ORDER — ACETAMINOPHEN 325 MG PO TABS
325.0000 mg | ORAL_TABLET | ORAL | Status: DC | PRN
  Administered 2012-03-26: 650 mg via ORAL
  Filled 2012-03-25: qty 2

## 2012-03-25 MED ORDER — PROPOFOL 10 MG/ML IV BOLUS
INTRAVENOUS | Status: DC | PRN
  Administered 2012-03-25: 130 mg via INTRAVENOUS

## 2012-03-25 MED ORDER — CHLORHEXIDINE GLUCONATE CLOTH 2 % EX PADS
6.0000 | MEDICATED_PAD | Freq: Every day | CUTANEOUS | Status: DC
  Administered 2012-03-25 – 2012-03-26 (×2): 6 via TOPICAL

## 2012-03-25 MED ORDER — PROTAMINE SULFATE 10 MG/ML IV SOLN
INTRAVENOUS | Status: DC | PRN
  Administered 2012-03-25: 50 mg via INTRAVENOUS

## 2012-03-25 MED ORDER — SODIUM CHLORIDE 0.9 % IV SOLN: 500.0000 mL | Freq: Once | INTRAVENOUS | Status: AC | PRN

## 2012-03-25 MED ORDER — ONDANSETRON HCL 4 MG/2ML IJ SOLN
INTRAMUSCULAR | Status: DC | PRN
  Administered 2012-03-25: 4 mg via INTRAVENOUS

## 2012-03-25 MED ORDER — SODIUM CHLORIDE 0.9 % IR SOLN
Status: DC | PRN
  Administered 2012-03-25: 11:00:00

## 2012-03-25 MED ORDER — OXYCODONE-ACETAMINOPHEN 5-325 MG PO TABS
1.0000 | ORAL_TABLET | ORAL | Status: DC | PRN
  Administered 2012-03-25: 2 via ORAL
  Administered 2012-03-26: 1 via ORAL
  Filled 2012-03-25: qty 2
  Filled 2012-03-25: qty 1

## 2012-03-25 MED ORDER — GUAIFENESIN-DM 100-10 MG/5ML PO SYRP: 15.0000 mL | ORAL_SOLUTION | ORAL | Status: DC | PRN

## 2012-03-25 MED ORDER — FENTANYL CITRATE 0.05 MG/ML IJ SOLN
25.0000 ug | INTRAMUSCULAR | Status: DC | PRN
  Administered 2012-03-26: 25 ug via INTRAVENOUS
  Filled 2012-03-25: qty 2

## 2012-03-25 MED ORDER — DOPAMINE-DEXTROSE 3.2-5 MG/ML-% IV SOLN: 3.0000 ug/kg/min | INTRAVENOUS | Status: DC

## 2012-03-25 MED ORDER — LACTATED RINGERS IV SOLN
INTRAVENOUS | Status: DC | PRN
  Administered 2012-03-25 (×2): via INTRAVENOUS

## 2012-03-25 MED ORDER — OXYCODONE HCL 5 MG/5ML PO SOLN: 5.0000 mg | Freq: Once | ORAL | Status: DC | PRN

## 2012-03-25 MED ORDER — BISACODYL 10 MG RE SUPP: 10.0000 mg | Freq: Every day | RECTAL | Status: DC | PRN

## 2012-03-25 MED ORDER — IODIXANOL 320 MG/ML IV SOLN
INTRAVENOUS | Status: DC | PRN
  Administered 2012-03-25: 151 mL via INTRAVENOUS

## 2012-03-25 MED ORDER — HYDROMORPHONE HCL PF 1 MG/ML IJ SOLN
0.2500 mg | INTRAMUSCULAR | Status: DC | PRN
  Administered 2012-03-25 (×4): 0.5 mg via INTRAVENOUS

## 2012-03-25 MED ORDER — MAGNESIUM SULFATE 40 MG/ML IJ SOLN
2.0000 g | Freq: Once | INTRAMUSCULAR | Status: AC | PRN
  Filled 2012-03-25: qty 50

## 2012-03-25 MED ORDER — ROCURONIUM BROMIDE 100 MG/10ML IV SOLN
INTRAVENOUS | Status: DC | PRN
  Administered 2012-03-25: 20 mg via INTRAVENOUS
  Administered 2012-03-25: 5 mg via INTRAVENOUS
  Administered 2012-03-25: 10 mg via INTRAVENOUS
  Administered 2012-03-25: 50 mg via INTRAVENOUS
  Administered 2012-03-25: 5 mg via INTRAVENOUS

## 2012-03-25 MED ORDER — DOCUSATE SODIUM 100 MG PO CAPS: 100.0000 mg | ORAL_CAPSULE | Freq: Every day | ORAL | Status: DC

## 2012-03-25 MED ORDER — PANTOPRAZOLE SODIUM 40 MG PO TBEC
40.0000 mg | DELAYED_RELEASE_TABLET | Freq: Every day | ORAL | Status: DC
  Administered 2012-03-25: 40 mg via ORAL
  Filled 2012-03-25: qty 1

## 2012-03-25 MED ORDER — FENTANYL BOLUS VIA INFUSION
25.0000 ug | INTRAVENOUS | Status: DC | PRN
Start: 2012-03-25 — End: 2012-03-25
  Filled 2012-03-25: qty 25

## 2012-03-25 MED ORDER — GLYCOPYRROLATE 0.2 MG/ML IJ SOLN
INTRAMUSCULAR | Status: DC | PRN
  Administered 2012-03-25: 0.2 mg via INTRAVENOUS
  Administered 2012-03-25: .2 mg via INTRAVENOUS
  Administered 2012-03-25: .6 mg via INTRAVENOUS

## 2012-03-25 MED ORDER — HYDRALAZINE HCL 20 MG/ML IJ SOLN: 10.0000 mg | INTRAMUSCULAR | Status: DC | PRN

## 2012-03-25 MED ORDER — HEPARIN SODIUM (PORCINE) 1000 UNIT/ML IJ SOLN
INTRAMUSCULAR | Status: DC | PRN
  Administered 2012-03-25: 1000 [IU] via INTRAVENOUS
  Administered 2012-03-25: 7000 [IU] via INTRAVENOUS

## 2012-03-25 MED ORDER — NEOSTIGMINE METHYLSULFATE 1 MG/ML IJ SOLN
INTRAMUSCULAR | Status: DC | PRN
  Administered 2012-03-25: 5 mg via INTRAVENOUS

## 2012-03-25 MED ORDER — ACETAMINOPHEN 650 MG RE SUPP: 325.0000 mg | RECTAL | Status: DC | PRN

## 2012-03-25 MED ORDER — MUPIROCIN 2 % EX OINT
1.0000 "application " | TOPICAL_OINTMENT | Freq: Two times a day (BID) | CUTANEOUS | Status: DC
  Administered 2012-03-25 – 2012-03-26 (×2): 1 via NASAL
  Filled 2012-03-25 (×2): qty 22

## 2012-03-25 MED ORDER — LABETALOL HCL 5 MG/ML IV SOLN: 10.0000 mg | INTRAVENOUS | Status: DC | PRN

## 2012-03-25 MED ORDER — FENTANYL CITRATE 0.05 MG/ML IJ SOLN
INTRAMUSCULAR | Status: DC | PRN
  Administered 2012-03-25: 100 ug via INTRAVENOUS

## 2012-03-25 MED ORDER — POTASSIUM CHLORIDE CRYS ER 20 MEQ PO TBCR: 20.0000 meq | EXTENDED_RELEASE_TABLET | Freq: Once | ORAL | Status: AC | PRN

## 2012-03-25 MED ORDER — MORPHINE SULFATE 2 MG/ML IJ SOLN
2.0000 mg | INTRAMUSCULAR | Status: DC | PRN
  Administered 2012-03-25 (×4): 2 mg via INTRAVENOUS
  Administered 2012-03-25 – 2012-03-26 (×4): 4 mg via INTRAVENOUS
  Filled 2012-03-25: qty 2
  Filled 2012-03-25: qty 1
  Filled 2012-03-25 (×5): qty 2
  Filled 2012-03-25: qty 1

## 2012-03-25 MED ORDER — OXYCODONE HCL 5 MG PO TABS: 5.0000 mg | ORAL_TABLET | Freq: Once | ORAL | Status: DC | PRN

## 2012-03-25 MED ORDER — DEXTROSE 5 % IV SOLN
1.5000 g | Freq: Two times a day (BID) | INTRAVENOUS | Status: AC
  Administered 2012-03-25 – 2012-03-26 (×2): 1.5 g via INTRAVENOUS
  Filled 2012-03-25 (×2): qty 1.5

## 2012-03-25 MED ORDER — METOPROLOL TARTRATE 1 MG/ML IV SOLN: 2.0000 mg | INTRAVENOUS | Status: DC | PRN

## 2012-03-25 MED ORDER — SODIUM CHLORIDE 0.9 % IV SOLN: INTRAVENOUS | Status: DC

## 2012-03-25 MED ORDER — LACTATED RINGERS IV SOLN
INTRAVENOUS | Status: DC | PRN
  Administered 2012-03-25 (×2): via INTRAVENOUS

## 2012-03-25 MED ORDER — MIDAZOLAM HCL 5 MG/5ML IJ SOLN
INTRAMUSCULAR | Status: DC | PRN
  Administered 2012-03-25: 2 mg via INTRAVENOUS

## 2012-03-25 MED ORDER — PHENOL 1.4 % MT LIQD: 1.0000 | OROMUCOSAL | Status: DC | PRN

## 2012-03-25 MED ORDER — ONDANSETRON HCL 4 MG/2ML IJ SOLN: 4.0000 mg | Freq: Four times a day (QID) | INTRAMUSCULAR | Status: DC | PRN

## 2012-03-25 MED ORDER — HYDROMORPHONE HCL PF 1 MG/ML IJ SOLN
INTRAMUSCULAR | Status: AC
  Filled 2012-03-25: qty 1

## 2012-03-25 SURGICAL SUPPLY — 79 items
BAG BANDED W/RUBBER/TAPE 36X54 (MISCELLANEOUS) ×2 IMPLANT
BAG DECANTER FOR FLEXI CONT (MISCELLANEOUS) IMPLANT
BAG ISOLATION DRAPE 18X18 (DRAPES) ×1 IMPLANT
BAG SNAP BAND KOVER 36X36 (MISCELLANEOUS) ×8 IMPLANT
BALLN CODA OCL 2-9.0-35-120-3 (BALLOONS) ×2
BALLOON COD OCL 2-9.0-35-120-3 (BALLOONS) ×1 IMPLANT
CANISTER SUCTION 2500CC (MISCELLANEOUS) ×2 IMPLANT
CATH BEACON 5.038 65CM KMP-01 (CATHETERS) ×2 IMPLANT
CATH OMNI FLUSH .035X70CM (CATHETERS) ×2 IMPLANT
CLIP TI MEDIUM 24 (CLIP) ×2 IMPLANT
CLIP TI WIDE RED SMALL 24 (CLIP) ×2 IMPLANT
CLOTH BEACON ORANGE TIMEOUT ST (SAFETY) ×2 IMPLANT
COVER DOME SNAP 22 D (MISCELLANEOUS) ×2 IMPLANT
COVER MAYO STAND STRL (DRAPES) ×2 IMPLANT
COVER SURGICAL LIGHT HANDLE (MISCELLANEOUS) ×2 IMPLANT
DERMABOND ADHESIVE PROPEN (GAUZE/BANDAGES/DRESSINGS) ×1
DERMABOND ADVANCED (GAUZE/BANDAGES/DRESSINGS) ×1
DERMABOND ADVANCED .7 DNX12 (GAUZE/BANDAGES/DRESSINGS) ×1 IMPLANT
DERMABOND ADVANCED .7 DNX6 (GAUZE/BANDAGES/DRESSINGS) ×1 IMPLANT
DEVICE CLOSURE PERCLS PRGLD 6F (VASCULAR PRODUCTS) ×4 IMPLANT
DEVICE TORQUE 50000 (MISCELLANEOUS) IMPLANT
DRAIN CHANNEL 10F 3/8 F FF (DRAIN) IMPLANT
DRAIN CHANNEL 10M FLAT 3/4 FLT (DRAIN) IMPLANT
DRAPE ISOLATION BAG 18X18 (DRAPES) ×1
DRAPE TABLE COVER HEAVY DUTY (DRAPES) ×2 IMPLANT
ELECT REM PT RETURN 9FT ADLT (ELECTROSURGICAL) ×4
ELECTRODE REM PT RTRN 9FT ADLT (ELECTROSURGICAL) ×2 IMPLANT
EVACUATOR 3/16  PVC DRAIN (DRAIN)
EVACUATOR 3/16 PVC DRAIN (DRAIN) IMPLANT
EVACUATOR SILICONE 100CC (DRAIN) IMPLANT
GLOVE BIOGEL PI IND STRL 7.5 (GLOVE) ×1 IMPLANT
GLOVE BIOGEL PI INDICATOR 7.5 (GLOVE) ×1
GLOVE SURG SS PI 7.5 STRL IVOR (GLOVE) ×4 IMPLANT
GOWN PREVENTION PLUS XXLARGE (GOWN DISPOSABLE) ×2 IMPLANT
GOWN STRL NON-REIN LRG LVL3 (GOWN DISPOSABLE) ×8 IMPLANT
GRAFT BALLN CATH 65CM (STENTS) IMPLANT
GRAFT FLEX Z TRAK TFFB-32-96 (Endovascular Graft) ×2 IMPLANT
GRAFT LEG ILIAC ZSLE-16-56-ZT (Endovascular Graft) ×2 IMPLANT
GRAFT LEG ILIAC ZSLE-20-56-ZT (Endovascular Graft) ×2 IMPLANT
GUIDING SHEATH PERFORM XLG 20F (SHEATH) ×2
HEMOSTAT SNOW SURGICEL 2X4 (HEMOSTASIS) IMPLANT
HEMOSTAT SURGICEL 2X14 (HEMOSTASIS) IMPLANT
INTRODUCER SET COOK 14FR (MISCELLANEOUS) ×2 IMPLANT
KIT BASIN OR (CUSTOM PROCEDURE TRAY) ×2 IMPLANT
KIT ROOM TURNOVER OR (KITS) ×2 IMPLANT
NEEDLE PERC 18GX7CM (NEEDLE) ×2 IMPLANT
NS IRRIG 1000ML POUR BTL (IV SOLUTION) ×2 IMPLANT
PACK AORTA (CUSTOM PROCEDURE TRAY) ×2 IMPLANT
PAD ARMBOARD 7.5X6 YLW CONV (MISCELLANEOUS) ×4 IMPLANT
PENCIL BUTTON HOLSTER BLD 10FT (ELECTRODE) IMPLANT
PERCLOSE PROGLIDE 6F (VASCULAR PRODUCTS) ×8
SHEATH AVANTI 11CM 8FR (MISCELLANEOUS) ×2 IMPLANT
SHEATH BRITE TIP 8FR 23CM (MISCELLANEOUS) ×4 IMPLANT
SHEATH GUIDING PERFORM XLG 20F (SHEATH) ×1 IMPLANT
STAPLER VISISTAT 35W (STAPLE) IMPLANT
STENT GRAFT BALLN CATH 65CM (STENTS)
STOPCOCK MORSE 400PSI 3WAY (MISCELLANEOUS) ×2 IMPLANT
SUT ETHILON 3 0 PS 1 (SUTURE) IMPLANT
SUT PROLENE 5 0 C 1 24 (SUTURE) IMPLANT
SUT SILK 3 0 (SUTURE) ×1
SUT SILK 3-0 18XBRD TIE 12 (SUTURE) ×1 IMPLANT
SUT VIC AB 2-0 CT1 36 (SUTURE) IMPLANT
SUT VIC AB 3-0 SH 27 (SUTURE)
SUT VIC AB 3-0 SH 27X BRD (SUTURE) IMPLANT
SUT VICRYL 4-0 PS2 18IN ABS (SUTURE) ×4 IMPLANT
SYR 20CC LL (SYRINGE) ×4 IMPLANT
SYR 30ML LL (SYRINGE) IMPLANT
SYR 5ML LL (SYRINGE) IMPLANT
SYR MEDRAD MARK V 150ML (SYRINGE) ×2 IMPLANT
SYRINGE 10CC LL (SYRINGE) ×6 IMPLANT
TOWEL OR 17X24 6PK STRL BLUE (TOWEL DISPOSABLE) ×4 IMPLANT
TOWEL OR 17X26 10 PK STRL BLUE (TOWEL DISPOSABLE) ×4 IMPLANT
TRAY FOLEY CATH 14FRSI W/METER (CATHETERS) ×2 IMPLANT
TUBING HIGH PRESSURE 120CM (CONNECTOR) ×4 IMPLANT
WATER STERILE IRR 1000ML POUR (IV SOLUTION) ×2 IMPLANT
WIRE AMPLATZ SS-J .035X260CM (WIRE) ×2 IMPLANT
WIRE BENTSON .035X145CM (WIRE) ×4 IMPLANT
WIRE LUNDERQUIST .035X180CM (WIRE) ×2 IMPLANT
WIRE STIFF LUNDERQUIST 260MM (WIRE) ×2 IMPLANT

## 2012-03-25 NOTE — Progress Notes (Signed)
Received pt from PACU, d/t repair and stent of AAA. PT has bilateral groin site incision that are clean dry and intact. Site are at a level zero. Pt's only complaint is pain in his scrotal area. Pt believes the pain is associated with his foley. Spoke with Dr. Brien Few about pt's stated pain, Dr. Brien Few then spoke directly with Dr. Myra Gianotti, who gave order to d/c foley.

## 2012-03-25 NOTE — Anesthesia Preprocedure Evaluation (Signed)
Anesthesia Evaluation  Patient identified by MRN, date of birth, ID band Patient awake    Reviewed: Allergy & Precautions, H&P , NPO status , Patient's Chart, lab work & pertinent test results  Airway Mallampati: II TM Distance: >3 FB Neck ROM: Full    Dental No notable dental hx. (+) Teeth Intact and Dental Advisory Given   Pulmonary neg pulmonary ROS,  breath sounds clear to auscultation  Pulmonary exam normal       Cardiovascular hypertension, On Medications + Peripheral Vascular Disease Rhythm:Regular Rate:Normal     Neuro/Psych negative neurological ROS  negative psych ROS   GI/Hepatic Neg liver ROS, Medicated and Controlled,  Endo/Other  negative endocrine ROS  Renal/GU negative Renal ROS  negative genitourinary   Musculoskeletal   Abdominal   Peds  Hematology negative hematology ROS (+)   Anesthesia Other Findings   Reproductive/Obstetrics negative OB ROS                           Anesthesia Physical Anesthesia Plan  ASA: III  Anesthesia Plan: General   Post-op Pain Management:    Induction: Intravenous  Airway Management Planned: Oral ETT  Additional Equipment: Arterial line and CVP  Intra-op Plan:   Post-operative Plan: Extubation in OR and Possible Post-op intubation/ventilation  Informed Consent: I have reviewed the patients History and Physical, chart, labs and discussed the procedure including the risks, benefits and alternatives for the proposed anesthesia with the patient or authorized representative who has indicated his/her understanding and acceptance.   Dental advisory given  Plan Discussed with: CRNA  Anesthesia Plan Comments:         Anesthesia Quick Evaluation

## 2012-03-25 NOTE — Addendum Note (Signed)
Addendum  created 03/25/12 1428 by Coralee Rud, CRNA   Modules edited:Anesthesia LDA

## 2012-03-25 NOTE — Progress Notes (Signed)
INITIAL ADULT NUTRITION ASSESSMENT Date: 03/25/2012   Time: 5:37 PM Reason for Assessment: MST  ASSESSMENT: Male 56 y.o.  Dx: abdominal pain  Hx:  Past Medical History  Diagnosis Date  . Colon polyps   . Bladder cancer   . Hypertension    Past Surgical History  Procedure Date  . Tonsillectomy and adenoidectomy   . Vasectomy   . Bladder cancer    Related Meds:  Scheduled Meds:   .  ceFAZolin (ANCEF) IV  2 g Intravenous On Call  . cefUROXime (ZINACEF)  IV  1.5 g Intravenous Q12H  . Chlorhexidine Gluconate Cloth  6 each Topical Q0600  . docusate sodium  100 mg Oral Daily  . hydrochlorothiazide  25 mg Oral Daily  . HYDROmorphone      . lisinopril  20 mg Oral Daily  . mupirocin ointment  1 application Nasal BID  . nicotine  14 mg Transdermal Daily  . pantoprazole  40 mg Oral Q1200  . sodium chloride  3 mL Intravenous Q12H  . sodium chloride  3 mL Intravenous Q12H  . DISCONTD: lisinopril-hydrochlorothiazide  1 tablet Oral Daily  . DISCONTD: pantoprazole  40 mg Oral Q1200   Continuous Infusions:   . sodium chloride 100 mL/hr at 03/25/12 1500  . sodium chloride    . DOPamine     PRN Meds:.sodium chloride, sodium chloride, acetaminophen, acetaminophen, alum & mag hydroxide-simeth, bisacodyl, guaiFENesin-dextromethorphan, hydrALAZINE, HYDROcodone-acetaminophen, labetalol, magnesium sulfate 1 - 4 g bolus IVPB, metoprolol, morphine injection, ondansetron (ZOFRAN) IV, ondansetron, oxyCODONE-acetaminophen, phenol, potassium chloride, senna-docusate, sodium chloride, zolpidem, DISCONTD: acetaminophen, DISCONTD: acetaminophen DISCONTD: alum & mag hydroxide-simeth, DISCONTD: heparin 6000 unit irrigation, DISCONTD:  HYDROmorphone (DILAUDID) injection, DISCONTD: iodixanol, DISCONTD:  morphine injection, DISCONTD: ondansetron, DISCONTD: oxyCODONE, DISCONTD: oxyCODONE, DISCONTD: senna-docusate   Ht: 5\' 9"  (175.3 cm)  Wt: 194 lb 12.8 oz (88.361 kg)  Ideal Wt: 72.7 kg % Ideal Wt:  121%  Usual Wt: 217 lbs % Usual Wt: 89.4%  Body mass index is 28.77 kg/(m^2).  Food/Nutrition Related Hx: 30 lbs wt loss in 1-2 months  Labs:  CMP     Component Value Date/Time   NA 139 03/25/2012 1209   K 4.1 03/25/2012 1209   CL 106 03/25/2012 1209   CO2 28 03/25/2012 1209   GLUCOSE 93 03/25/2012 1209   BUN 15 03/25/2012 1209   CREATININE 0.57 03/25/2012 1209   CALCIUM 8.3* 03/25/2012 1209   PROT 6.1 03/25/2012 0550   ALBUMIN 3.5 03/25/2012 0550   AST 11 03/25/2012 0550   ALT 5 03/25/2012 0550   ALKPHOS 87 03/25/2012 0550   BILITOT 0.4 03/25/2012 0550   GFRNONAA >90 03/25/2012 1209   GFRAA >90 03/25/2012 1209   Intake: sips Output:   Intake/Output Summary (Last 24 hours) at 03/25/12 1739 Last data filed at 03/25/12 1600  Gross per 24 hour  Intake   4180 ml  Output   1205 ml  Net   2975 ml   Last BM (9/13)  Diet Order: Clear Liquid  Supplements/Tube Feeding: none at this time  IVF:    sodium chloride Last Rate: 100 mL/hr at 03/25/12 1500  sodium chloride   DOPamine     Estimated Nutritional Needs:   Kcal: 2200-2500 kcal Protein: 88-105g protein Fluid: >2.2 L/day  Pt reports his usual wt as 217 lbs which he last weighed at a July physical.  Pt now weighs 194 lbs representing 10.5% loss in <2 months.  Pt states that the wt loss occurred prior to  the onset of abdominal pain.  He denies trying to lose wt, but attributed wt loss to working in the summer heat.   Pt denies concern for nutrition status stating that the wt loss was desirable and he would like to get down to 180 lbs.  RD discussed intentional vs. Unintentional wt loss and encouraged wt maintenance through acute illness.  Pt agrees stating 'I have lost muscle.'   Pt declines offer for nutrition supplements at this time due to decreased appetite and increased pain.  Will continue to monitor for diet adequacy and advancement.  Limited dietary recall obtained: Breakfast:  Toast, 2 eggs Lunch: Pack of  crackers Dinner:  Meat, vegetable sides, starch  Wife reports pt has been eating much less for about the last month, but did not notice initial wt loss. States that it recently became obvious when so much came off at one time.  Pt qualifies for severe malnutrition of chronic illness due to 10.5% wt loss in <3 months, and pt consuming <75% of estimated needs per wife's report and food recall.   NUTRITION DIAGNOSIS: Unintended wt loss  RELATED TO: abdominal pain  AS EVIDENCE BY: 30 lb wt loss in 1-2 months  MONITORING/EVALUATION(Goals): 1.  Food/Beverage; diet advancement with tolerance  EDUCATION NEEDS: -Education needs addressed with pt and wife  INTERVENTION: 1.  General healthful diet; pt declines nutrition supplements at this time. Discussed nutrition status and nutrition-related goals with pt and wife.  Continue to follow for appropriate interventions.   DOCUMENTATION CODES Per approved criteria  -Severe malnutrition in the context of chronic illness    Loyce Dys, MS RD LDN Clinical Inpatient Dietitian Pager: [phone] Weekend/After hours pager: [phone]

## 2012-03-25 NOTE — Progress Notes (Addendum)
[facility] PROGRESS NOTE  Carl Lee:[id number] DOB: Feb 04, 1956 DOA: 03/24/2012 PCP: Raliegh Ip, MD  Assessment/Plan: Active Problems:  Adenomatous polyps  Aortic aneurysm  Epigastric abdominal pain  Tobacco abuse  Hypertension  History of bladder cancer    1. Infrarenal Aortic aneurysm:   Patient presented with 4 weeks of abdominal pain, associated with weight loss. Imaging studies confirmed a 5.2 cm infrarenal AAA, which was tender on physical exam. Dr Durene Cal, provided vascular surgical consultation, and patent is status post endovascular stent graft today. Immediate post-operative condition is satisfactory. Manage per vascular surgeon's recommendations. Have discussed with Dr Myra Gianotti, and foley discontinued, in view of discomfort.  2. Epigastric pain:   This could well be due to his AAA, given radiation to back, however, against a background of prolonged NSAID use, peptic ulcer disease is also a consideration. Managing with PPI. Avoiding NSAID use. For GI consult, when stable, if still symptomatic.  3. Weight loss/Hematochezia:   Will probably benefit from endoscopic evaluation in due course. Tumor markers are pending. CT abdomen/pelvis, revealed severe diverticulosis. He does have a history of hemorrhoids, confirmed endoscopically, about 2 years or so, ago.  4. Tobacco abuse:   Counseled, and commenced on Nicotine patch.   5. Hypertension:  BP is controlled at this time.   6. Migraines:   For prn analgesics.  7. History of bladder cancer:  Reportedly in remission. Patient will continue to follow up with his primary urologist.    Code Status: Full Code.  Family Communication:  Disposition Plan: Per vascular surgery.    Brief narrative: This is a 56 year old gentleman,  With history of HTN, Migraine headaches, bladder cancer in remission on regular follow up with urologist, presenting with 4 weeks of epigastric pain, radiates to his back, as well as  approximately 30 pound weight loss over the past 3 months. He denies any nausea, vomiting, or diarrhea. He does have some occasional bright red blood in the stool which has been attributed to hemorrhoids. Patient had a colonoscopy approximately 2-1/2 years ago. He has utilized 6-8 BC powder daily for the past 30 years, for headaches. The patient saw his gastroenterologist Dr. Christella Hartigan who sent him for a CT abdomen and pelvis which revealed 5.2 cm infrarenal abdominal aortic aneurysm.  aortic aneurysm, he was sent in for evaluation by the surgeon.    Consultants:  Dr Durene Cal, vascular surgeon.   Procedures:  Abdominal/Pelvic CT scan.   Abdominal aortic endovascular stent graft , 03/25/12.  Antibiotics:  Cefuroxime 03/25/12>>>  HPI/Subjective: C/O Bladder discomfort and pain, from Foley catheter.   Objective: Vital signs in last 24 hours: Temp:  [97.7 F (36.5 C)-98.1 F (36.7 C)] 97.8 F (36.6 C) (09/14 1317) Pulse Rate:  [54-79] 60  (09/14 1331) Resp:  [7-21] 12  (09/14 1331) BP: (108-150)/(55-89) 125/55 mmHg (09/14 1322) SpO2:  [88 %-100 %] 98 % (09/14 1331) Arterial Line BP: (123-149)/(61-78) 129/66 mmHg (09/14 1331) Weight:  [83.553 kg (184 lb 3.2 oz)] 83.553 kg (184 lb 3.2 oz) (09/13 2002) Weight change:  Last BM Date: 03/24/12  Intake/Output from previous day:   Total I/O In: 3500 [I.V.:3500] Out: 405 [Urine:355; Blood:50]   Physical Exam: General: Not in acute pain, alert, communicative, fully oriented, not short of breath at rest. HEENT:  No clinical pallor, no jaundice, no conjunctival injection or discharge. Hydration is fair.  NECK:  Supple, JVP not seen, no carotid bruits, no palpable lymphadenopathy, no palpable goiter. CHEST:  Clinically clear to auscultation,  no wheezes, no crackles. HEART:  Sounds 1 and 2 heard, normal, regular, no murmurs. ABDOMEN:  Full, soft, still tender, no guarding, normal bowel sounds heard. GENITALIA:  Normal external  genitalia. Foley in situ, and urine output is good.  LOWER EXTREMITIES:  No pitting edema, palpable peripheral pulses. MUSCULOSKELETAL SYSTEM:  Generalized osteoarthritic changes, otherwise, normal. CENTRAL NERVOUS SYSTEM:  No focal neurologic deficit on gross examination.  Lab Results:  [facility] 03/25/12 1209 03/25/12 0550  WBC 10.2 12.0*  HGB 13.2 14.8  HCT 38.3* 41.6  PLT 220 301    Basename 03/25/12 1209 03/25/12 0550  NA 139 139  K 4.1 3.6  CL 106 102  CO2 28 29  GLUCOSE 93 105*  BUN 15 20  CREATININE 0.57 0.70  CALCIUM 8.3* 9.0   Recent Results (from the past 240 hour(s))  SURGICAL PCR SCREEN     Status: Abnormal   Collection Time   03/25/12 12:56 AM      Component Value Range Status Comment   MRSA, PCR POSITIVE (*) NEGATIVE Final    Staphylococcus aureus POSITIVE (*) NEGATIVE Final      Studies/Results: Ct Abdomen Pelvis W Contrast  03/24/2012  *RADIOLOGY REPORT*  Clinical Data: Abdominal pain.  30 pounds weight loss over past 2 months.  Lower GI bleeding.  Bladder carcinoma.  CT ABDOMEN AND PELVIS WITH CONTRAST  Technique:  Multidetector CT imaging of the abdomen and pelvis was performed following the standard protocol during bolus administration of intravenous contrast.  Contrast: OMNIPAQUE IOHEXOL 300 MG/ML  SOLN  Comparison: None.  Findings: The liver, gallbladder, pancreas, spleen, adrenal glands, and kidneys are normal in appearance, except for a tiny right renal cyst.  No evidence of hydronephrosis.  No soft tissue masses or lymphadenopathy identified within the abdomen or pelvis.  An infrarenal abdominal aortic aneurysm is seen which measures 5.2 cm in maximum diameter.  No evidence of aneurysm leak or rupture.  Severe diverticulosis is seen involving the descending and sigmoid colon.  The distal colon is collapsed which makes evaluation of wall thickness difficult.  No definite peri colonic inflammatory changes identified.  No other inflammatory process or  abnormal fluid collections are seen within the abdomen or pelvis.  No evidence of dilated bowel loops or hernia.  IMPRESSION:  1.  Severe sigmoid and descending colon diverticulosis.  No definite radiographic signs of diverticulitis or other acute findings. 2.  5.2 cm infrarenal abdominal aortic aneurysm.  No evidence of aneurysm leak or rupture.  Vascular surgery consultation is advised due to risk of increased risk of aneurysm rupture at this size.   Original Report Authenticated By: Danae Orleans, M.D.     Medications: Scheduled Meds:   .  ceFAZolin (ANCEF) IV  2 g Intravenous On Call  . cefUROXime (ZINACEF)  IV  1.5 g Intravenous Q12H  . Chlorhexidine Gluconate Cloth  6 each Topical Q0600  . docusate sodium  100 mg Oral Daily  . hydrochlorothiazide  25 mg Oral Daily  . HYDROmorphone      . lisinopril  20 mg Oral Daily  . mupirocin ointment  1 application Nasal BID  . nicotine  14 mg Transdermal Daily  . pantoprazole  40 mg Oral Q1200  . sodium chloride  3 mL Intravenous Q12H  . sodium chloride  3 mL Intravenous Q12H  . DISCONTD: lisinopril-hydrochlorothiazide  1 tablet Oral Daily  . DISCONTD: pantoprazole  40 mg Oral Q1200   Continuous Infusions:   . sodium chloride 100  mL/hr at 03/25/12 1401  . sodium chloride    . DOPamine     PRN Meds:.sodium chloride, sodium chloride, acetaminophen, acetaminophen, alum & mag hydroxide-simeth, bisacodyl, guaiFENesin-dextromethorphan, hydrALAZINE, HYDROcodone-acetaminophen, labetalol, magnesium sulfate 1 - 4 g bolus IVPB, metoprolol, morphine injection, morphine injection, ondansetron (ZOFRAN) IV, ondansetron, oxyCODONE-acetaminophen, phenol, potassium chloride, senna-docusate, senna-docusate, sodium chloride, zolpidem, DISCONTD: acetaminophen DISCONTD: acetaminophen, DISCONTD: alum & mag hydroxide-simeth, DISCONTD: heparin 6000 unit irrigation, DISCONTD:  HYDROmorphone (DILAUDID) injection, DISCONTD: iodixanol, DISCONTD: ondansetron, DISCONTD:  oxyCODONE, DISCONTD: oxyCODONE    LOS: 1 day   Nolyn Swab,CHRISTOPHER  [facility] Pager [phone]. If 8PM-8AM, please contact night-coverage at [email or web address], password [facility] 03/25/2012, 2:04 PM  LOS: 1 day

## 2012-03-25 NOTE — Progress Notes (Signed)
S/p EVAR C/o severe right groin pain radiating into back. Required i/o cath with out relief of pain  Right groin is soft Palpable pedal and femoral pulses  Suspect he has nerve irritation from the access.  Will treat with narcotics for now.  If he continues to have pain tomorrow and his renal function is stable, I will repeat the CT scan to rule out a dissection, which at this time I think is unlikely given his pulse exam and the  Final images from the case.  Durene Cal

## 2012-03-25 NOTE — H&P (Signed)
[facility] [place]  Consult Note  Patient name: Carl Lee MRN: [id number] DOB: [birth year] Sex: male  Consulting Physician: Dr. Christella Hartigan  Reason for Consult: No chief complaint on file.   HISTORY OF PRESENT ILLNESS:  This is a very pleasant 56 year old gentleman that I was asked to evaluate for an abdominal aortic aneurysm. The patient states that he has been having abdominal pain which radiates to his back for the past several weeks. He has no exacerbating or alleviating factors. He is also had a 30 pound weight loss. He was sent for a CT scan earlier today to rule out the possibility of a underlying neoplasm given his symptoms. The CT scan revealed a 5.2 cm infrarenal abdominal aortic aneurysm without evidence of rupture. He was admitted to the hospital for further evaluation and management.  The patient is medically managed for his hypertension. He began taking an ACE inhibitor 2-3 months ago. He also has a history of bladder cancer. He continues to be a smoker, but is going to quit on his birthday in a few days. He has a grandfather that had a AAA.  Past Medical History   Diagnosis  Date   .  Colon polyps    .  Bladder cancer     Past Surgical History   Procedure  Date   .  Tonsillectomy and adenoidectomy    .  Vasectomy    .  Bladder cancer     History    Social History   .  Marital Status:  Married     Spouse Name:  N/A     Number of Children:  2   .  Years of Education:  N/A    Occupational History   .  Self Employed     Social History Main Topics   .  Smoking status:  Current Every Day Smoker   .  Smokeless tobacco:  Never Used   .  Alcohol Use:  Yes   .  Drug Use:  No   .  Sexually Active:  Not on file    Other Topics  Concern   .  Not on file    Social History Narrative   .  No narrative on file    Family History   Problem  Relation  Age of Onset   .  Diabetes  Mother    .  Heart disease  Mother    .  COPD  Mother    .  Cancer  Mother        Bladder    .  Diabetes  Father    .  Heart disease  Father    .  Dementia  Father     Allergies as of 03/24/2012 - Review Complete 03/24/2012   Allergen  Reaction  Noted   .  Codeine   03/24/2012    Current Facility-Administered Medications on File Prior to Encounter   Medication  Dose  Route  Frequency  Provider  Last Rate  Last Dose   .  iohexol (OMNIPAQUE) 300 MG/ML solution 100 mL  100 mL  Intravenous  Once PRN  Medication Radiologist, MD   100 mL at 03/24/12 1640    Current Outpatient Prescriptions on File Prior to Encounter   Medication  Sig  Dispense  Refill   .  Aspirin-Salicylamide-Caffeine (BC HEADACHE POWDER PO)  Take by mouth as needed.     [name]  dexlansoprazole (DEXILANT) 60 MG capsule  Take 1 capsule (  60 mg total) by mouth daily.  20 capsule  0   .  Glucosamine-Chondroitin-Ca-D3 (TRIPLE FLEX 50+ PO)  Take 1 capsule by mouth daily.     [name]  lisinopril-hydrochlorothiazide (PRINZIDE,ZESTORETIC) 20-25 MG per tablet  Take 1 tablet by mouth daily.     .  Multiple Vitamins-Minerals (CENTRUM SILVER ADULT 50+ PO)  Take 1 capsule by mouth daily.      REVIEW OF SYSTEMS:  Cardiovascular: No chest pain, chest pressure, palpitations, orthopnea, or dyspnea on exertion. No claudication or rest pain, No history of DVT or phlebitis.  Pulmonary: No productive cough, asthma or wheezing.  Neurologic: No weakness, paresthesias, aphasia, or amaurosis. No dizziness.  Hematologic: No bleeding problems or clotting disorders.  Musculoskeletal: No joint pain or joint swelling.  Gastrointestinal: Epigastric abdominal pain that radiates to his back and right upper quadrant. Periodic bleeding from external hemorrhoids  Genitourinary: No dysuria or hematuria.  Psychiatric:: No history of major depression.  Integumentary: No rashes or ulcers.  Constitutional: No fever or chills.  PHYSICAL EXAMINATION:  General: The patient appears their stated age. Vital signs are BP 150/89  Pulse 79  Temp 97.9  F (36.6 C) (Oral)  Resp 20  Ht 5\' 10"  (1.778 m)  Wt 184 lb 3.2 oz (83.553 kg)  BMI 26.43 kg/m2  SpO2 98%  Pulmonary: Respirations are non-labored  HEENT: No gross abnormalities  Abdomen: Soft and non-tender, but upon palpation of his aorta, he has significant pain  Musculoskeletal: There are no major deformities.  Neurologic: No focal weakness or paresthesias are detected,  Skin: There are no ulcer or rashes noted.  Psychiatric: The patient has normal affect.  Cardiovascular: There is a regular rate and rhythm without significant murmur appreciated. Palpable femoral pulses  Diagnostic Studies:  I have reviewed his CT scan which shows a 5.2 cm infrarenal abdominal aortic aneurysm  Assessment:  Likely Symptomatic abdominal aortic aneurysm  Plan:  I had a long discussion with the patient and family regarding his condition. While there is no way to know for sure that his pain is due to his aneurysm, given that he is very tender to palpation of his aneurysm, I have recommended endovascular repair to prevent rupture. I feel that this should be done urgently, and I have scheduled it for tomorrow morning. I discussed the risks which include but are not limited to bowel ischemia, lower extremity ischemia, bleeding, and cardio-pulmonary complications. I diagrammed out the repair, and all of their questions were answered. Total time spent reviewing the chart and CT scan and discussing it with the patient was > 90 min.    Jorge Ny, M.D.  [facility] [place]  Office: [phone]  Pager: [phone]

## 2012-03-25 NOTE — Op Note (Signed)
[facility]  Patient name: Carl Lee MRN: [id number] DOB: 05-Dec-1955 Sex: male  03/24/2012 - 03/25/2012 Pre-operative Diagnosis: Symptomatic abdominal aortic aneurysm Post-operative diagnosis:  Same Surgeon:  Jorge Ny Assistants:  Narda Amber Procedure:   #1: Bilateral percutaneous ultrasound guided common femoral artery access   #2: Catheter in aorta x2   #3: Abdominal aortogram   #4: Endovascular repair of abdominal aortic aneurysm (2 docking limbs) Anesthesia:  Gen. Blood Loss:  See anesthesia record Specimens:  None  Findings:  Complete exclusion Devices used: Main body (primary right) Cook Zenith 32 x 96   Ipsilateral right Cook Zenith 20 x 56   Contralateral left Cook Zenith 16 x 56  Indications:  The patient reports several weeks history of abdominal pain with radiation to his back and right lower quadrant. He has been undergoing a GI workup. This led to a CT scan yesterday which showed a 5.2 cm infrarenal abdominal aortic aneurysm. By my physical exam, upon palpation of his aorta, he had significant pain. I therefore felt that he had a symptomatic aneurysm. I had an extensive discussion with his family. While I cannot guarantee that his pain is related to his aneurysm, with his physical exam I feel that the aneurysm needs to be fixed prior to proceeding with any additional workup. The risks and benefits of the procedure were discussed with the patient and his family. They wished to proceed.  Procedure:  The patient was identified in the holding area and taken to [facility] OR ROOM 16  The patient was then placed supine on the table. general anesthesia was administered.  The patient was prepped and draped in the usual sterile fashion.  A time out was called and antibiotics were administered.  Ultrasound was used to evaluate both common femoral arteries. They were widely patent without significant calcification. A digital ultrasound image was acquired  of both femoral arteries. I made a small skin incision with a #11 blade bilaterally. Both common femoral arteries were accessed under ultrasound guidance with an 18-gauge needle. An 035 wires were placed bilaterally. Pro-glide devices were deployed in the 11:00 and 1:00 position for pre-closure. The [place] sheaths were placed bilaterally. The patient was fully heparinized. I then placed a Omni flush catheter up the left side and performed an abdominal aortogram which delineated the location of the renal arteries. The image detector was placed in the appropriate orientation. Over a Lunderquist wire on the right side, the main body device was placed. This was a Marine scientist 32 x 96. Additional images were obtained to correctly identify the ostium of the lower right renal accessory artery. The device was then deployed down to the contralateral gate. The top Was then released. The contralateral gate was cannulated with a Kumpe catheter and a Benson wire. The Omni flush catheter was able to be freely rotated within the main body of the device, confirming successful cannulation. An Amplatz superstiff wire was then placed. The image detector was rotated to a right anterior oblique position and a retrograde injection was performed through the sheath, delineating the location of the left hypogastric artery. The contralateral left limb, a Cook Zenith 16 x 56 device was prepared on the back table. The 8 French sheath was removed and the left limb was inserted under direct vision. It was deployed landing at the level of the left hypogastric artery. The device was removed and a 14 French sheath was replaced. Next, the remaining portion of the ipsilateral  limb was deployed. The top Was retrieved and the delivery device was removed. A 20 French sheath was then inserted. The image detector was then rotated to a left anterior oblique position and a retrograde sheath injection was performed, locating the right hypogastric artery. The  ipsilateral extension, a Cook Zenith 20 x 56 device was prepared on the back table and then inserted through the sheath. It was deployed, landing at the level of the right hypogastric artery. Next, a Coda balloon was inserted to mold the proximal and distal portions of the graft as well as device overlap. A completion arteriogram was then performed which showed continued patency of the renal arteries. There was good flow down the left limb of the graft but I did not see good flow down the right. I pulled the pigtail catheter down and performed an additional arteriogram while aspirating on the sheath in the right external iliac artery. This showed good flow down the right side. I also performed a retrograde injection through the sheath and the left groin which confirmed patency of the left external iliac artery. At this point I was satisfied with our results. There was good exclusion of the aneurysm. I replaced the stiff wires with Teena Dunk wires. The sheaths were then sequentially removed while securing the pro-glide devices. Both groins were hemostatic. The patient's heparin was reversed with protamine. Once the groins were hemostatic the skin was closed with 4-0 Vicryl and Dermabond was placed. The patient continued to have palpable pedal pulses at the end of the case. There were no complications.   Disposition:  To PACU in stable condition.   Juleen China, M.D. [facility] [place] Office: [phone] Pager:  [phone]

## 2012-03-25 NOTE — Progress Notes (Signed)
Pt's surgical PCR screen came back positive for MRSA and Staph aureus. Orders placed for Bactroban ointment and CHG bath per protocol. Pt placed on orange contact isolation. Pt to receive first dose of Bactroban and first CHG bath this morning.  Alfonso Ellis, RN

## 2012-03-25 NOTE — Transfer of Care (Signed)
Immediate Anesthesia Transfer of Care Note  Patient: Carl Lee  Procedure(s) Performed: Procedure(s) (LRB) with comments: ABDOMINAL AORTIC ENDOVASCULAR STENT GRAFT (N/A) - Cook Repair  Patient Location: PACU  Anesthesia Type: General  Level of Consciousness: awake, alert  and oriented  Airway & Oxygen Therapy: Patient Spontanous Breathing, Patient connected to nasal cannula oxygen and Patient connected to face mask oxygen  Post-op Assessment: Report given to PACU RN, Post -op Vital signs reviewed and stable, Patient moving all extremities and Patient moving all extremities X 4  Post vital signs: Reviewed and stable  Complications: No apparent anesthesia complications

## 2012-03-25 NOTE — Anesthesia Postprocedure Evaluation (Signed)
  Anesthesia Post-op Note  Patient: Carl Lee  Procedure(s) Performed: Procedure(s) (LRB) with comments: ABDOMINAL AORTIC ENDOVASCULAR STENT GRAFT (N/A) - Cook Repair  Patient Location: PACU  Anesthesia Type: General  Level of Consciousness: awake  Airway and Oxygen Therapy: Patient Spontanous Breathing and Patient connected to nasal cannula oxygen  Post-op Pain: moderate  Post-op Assessment: Post-op Vital signs reviewed, Patient's Cardiovascular Status Stable, Respiratory Function Stable, Patent Airway and No signs of Nausea or vomiting  Post-op Vital Signs: Reviewed and stable  Complications: No apparent anesthesia complications

## 2012-03-25 NOTE — Addendum Note (Signed)
Addendum  created 03/25/12 1428 by Ashwika Freels, CRNA   Modules edited:Anesthesia LDA    

## 2012-03-25 NOTE — Progress Notes (Signed)
Pt continues to complain of increased pain in bilaterally groin pain with worse pain in the right side. Pain is un-relived by pain medications. Groin site remain at a level zero with bilateral pedal pulses palpable. Vital signs remain within defined limited with a slight increase in blood pressure. Bladder scan showed a total of 300. I&O cath produced a total of out. Dr. Myra Gianotti called and in to asses pt. New order received for 25mg  IV fentanyl.

## 2012-03-25 NOTE — Preoperative (Signed)
Beta Blockers   Reason not to administer Beta Blockers:Not Applicable 

## 2012-03-26 DIAGNOSIS — F17200 Nicotine dependence, unspecified, uncomplicated: Secondary | ICD-10-CM

## 2012-03-26 DIAGNOSIS — I10 Essential (primary) hypertension: Secondary | ICD-10-CM

## 2012-03-26 LAB — BASIC METABOLIC PANEL
BUN: 10 mg/dL (ref 6–23)
GFR calc Af Amer: >90 mL/min (ref >90)
GFR calc non Af Amer: >90 mL/min (ref >90)
Potassium: 3.7 mEq/L (ref 3.5–5.1)
Sodium: 138 mEq/L (ref 135–145)

## 2012-03-26 LAB — CBC
MCHC: 35.4 g/dL (ref 30.0–36.0)
Platelets: 232 10*3/uL (ref 150–400)
RDW: 13 % (ref 11.5–15.5)

## 2012-03-26 MED ORDER — WHITE PETROLATUM GEL
Status: AC
  Administered 2012-03-26: 01:00:00
  Filled 2012-03-26: qty 5

## 2012-03-26 MED ORDER — OXYCODONE-ACETAMINOPHEN 5-325 MG PO TABS: 1.0000 | ORAL_TABLET | ORAL | Status: AC | PRN

## 2012-03-26 NOTE — Progress Notes (Addendum)
[facility]  Post-op EVAR Date of Surgery: 03/24/2012 - 03/25/2012 Surgeon: Moishe Spice): Nada Libman, MD POD: 1 Day Post-Op Device:   History of Present Illness  Carl Lee is a 56 y.o. male who is s/p EVAR. The patient complains of back pain; denies abdominal pain; denies lower extremity pain.  He is Ambulating and taking PO well without nausea or vomiting. Pt. has voided with foley out  IMAGING: Ct Abdomen Pelvis W Contrast  03/24/2012  *RADIOLOGY REPORT*  Clinical Data: Abdominal pain.  30 pounds weight loss over past 2 months.  Lower GI bleeding.  Bladder carcinoma.  CT ABDOMEN AND PELVIS WITH CONTRAST  Technique:  Multidetector CT imaging of the abdomen and pelvis was performed following the standard protocol during bolus administration of intravenous contrast.  Contrast: OMNIPAQUE IOHEXOL 300 MG/ML  SOLN  Comparison: None.  Findings: The liver, gallbladder, pancreas, spleen, adrenal glands, and kidneys are normal in appearance, except for a tiny right renal cyst.  No evidence of hydronephrosis.  No soft tissue masses or lymphadenopathy identified within the abdomen or pelvis.  An infrarenal abdominal aortic aneurysm is seen which measures 5.2 cm in maximum diameter.  No evidence of aneurysm leak or rupture.  Severe diverticulosis is seen involving the descending and sigmoid colon.  The distal colon is collapsed which makes evaluation of wall thickness difficult.  No definite peri colonic inflammatory changes identified.  No other inflammatory process or abnormal fluid collections are seen within the abdomen or pelvis.  No evidence of dilated bowel loops or hernia.  IMPRESSION:  1.  Severe sigmoid and descending colon diverticulosis.  No definite radiographic signs of diverticulitis or other acute findings. 2.  5.2 cm infrarenal abdominal aortic aneurysm.  No evidence of aneurysm leak or rupture.  Vascular surgery consultation is advised due to risk of  increased risk of aneurysm rupture at this size.   Original Report Authenticated By: Danae Orleans, M.D.    Dg Chest Portable 1 View  03/25/2012  *RADIOLOGY REPORT*  Clinical Data: Stent graft placement  PORTABLE CHEST - 1 VIEW  Comparison: None.  Findings: Normal heart size.  Patchy densities are seen in the right perihilar region and right lung base.  Minimal subsegmental atelectasis at the left base.  Right IJ vein introducer sheath noted with its tip in the right innominate vein.  No pneumothorax and no pleural effusion.  IMPRESSION: Patchy airspace opacities in the right mid and lower lung zones. Follow-up studies until resolution are recommended.   Original Report Authenticated By: Donavan Burnet, M.D.    Dg Abd Portable 1v  03/25/2012  *RADIOLOGY REPORT*  Clinical Data: Stent graft placement  PORTABLE ABDOMEN - 1 VIEW  Comparison: 03/24/2012  Findings: Aortobi-iliac stent graft has been placed.  It extends from mid L1 to the S1 region.  No disproportionate dilatation of bowel.  Contrast is present throughout the colon.  No obvious free intraperitoneal gas.  IMPRESSION: Aortobi-iliac stent graft placement.  Nonobstructive bowel gas pattern.   Original Report Authenticated By: Donavan Burnet, M.D.     Significant Diagnostic Studies: CBC Lab Results  Component Value Date   WBC 21.1* 03/26/2012   HGB 12.8* 03/26/2012   HCT 36.2* 03/26/2012   MCV 92.3 03/26/2012   PLT 232 03/26/2012     BMET    Component Value Date/Time   NA 138 03/26/2012 0431   K 3.7 03/26/2012 0431   CL 102 03/26/2012 0431   CO2  27 03/26/2012 0431   GLUCOSE 121* 03/26/2012 0431   BUN 10 03/26/2012 0431   CREATININE 0.59 03/26/2012 0431   CALCIUM 8.4 03/26/2012 0431   GFRNONAA >90 03/26/2012 0431   GFRAA >90 03/26/2012 0431    COAG Lab Results  Component Value Date   INR 1.05 03/25/2012   INR 0.96 03/25/2012   No results found for this basename: PTT     I/O last 3 completed shifts: In: 6280 [P.O.:1080;  I.V.:5200] Out: 3605 [Urine:3555; Blood:50] Patient Vitals for the past 24 hrs:  Urine Occurrence  03/26/12 0129 1     Physical Examination  BP Readings from Last 3 Encounters:  03/26/12 117/65  03/26/12 117/65  03/24/12 106/72   Temp Readings from Last 3 Encounters:  03/26/12 99.3 F (37.4 C) Oral  03/26/12 99.3 F (37.4 C) Oral   SpO2 Readings from Last 3 Encounters:  03/26/12 92%  03/26/12 92%   Pulse Readings from Last 3 Encounters:  03/26/12 80  03/26/12 80  03/24/12 64    General: A&O x 3, WDWN male in NAD Gait: Normal Pulmonary: normal non-labored breathing  Cardiac: RRR Abdomen: soft, NT, NABS Bilateral groin wounds: clean, dry, intact, without hematoma Vascular Exam/Pulses:Palpable PT pulses bilateral  Extremities without ischemic changes, no Gangrene , no cellulitis; no open wounds;   Neurologic: A&O X 3; Appropriate Affect  Assessment: Carl Lee is a 56 y.o. male who is 1 Day Post-Op EVAR.  Pt is doing well with no complaints  Plan: Home  The importance of surveillance of the endograft was discussed with the patient  A CTA of abdomen and pelvis will be scheduled for one month to assess for endoleak.  The patient will follow up with [place] in one month with these studies. He has a history of nightmares with narcotic pain medication.  He is more comfortable this am than last night.  He wants to continue on the percocet for home.  SignedMosetta Pigeon [phone] 03/26/2012 7:52 AM.   Agree with the above Right groin pain is significantly improved. He no longer has tenderness over his aorta to deep palpation Almost likely discharge him later this afternoon, if his pain remains controlled on oral narcotics and he continues to be able to empty his bladder. I will check a postvoid residual after his next voiding. He'll followup with me in one month with a CT angiogram of the abdomen and pelvis [place] Deshun Sedivy

## 2012-03-26 NOTE — Progress Notes (Signed)
[facility] PROGRESS NOTE  KULLEN TOMASETTI WUJ:[id number] DOB: 1956/01/15 DOA: 03/24/2012 PCP: Raliegh Ip, MD  Assessment/Plan: Active Problems:  Adenomatous polyps  Aortic aneurysm  Epigastric abdominal pain  Tobacco abuse  Hypertension  History of bladder cancer    1. Infrarenal Aortic aneurysm:   Patient presented with 4 weeks of abdominal pain, associated with weight loss. Imaging studies confirmed a 5.2 cm infrarenal AAA, which was tender on physical exam. Dr Durene Cal, provided vascular surgical consultation, and patent is status post endovascular stent graft on 03/25/12. Stable on POD#1. Manage per Dr Myra Gianotti.  2. Epigastric pain:   This could well be due to his AAA, given radiation to back, however, against a background of prolonged NSAID use, peptic ulcer disease is also a consideration. Managing with PPI. Avoiding NSAID use. Now asymptomatic. To follow up with Dr Christella Hartigan, his primary gastroenterologist.  3. Weight loss/Hematochezia:   CT abdomen/pelvis, revealed severe diverticulosis, but no concerning lesions. Tumor markers, including AFP, CEA, PSA and Ca 27, 29, are all negative. He does have a history of hemorrhoids, confirmed endoscopically, about 2 years or so, ago, at which time he also had polypectomy. He is scheduled for repeat colonoscopy in 06/2013. Will defer timing of colonoscopy to Dr Christella Hartigan. .  4. Tobacco abuse:   Counseled.   5. Hypertension:  BP is controlled at this time.   6. Migraines:   Asymptomatic on  prn analgesics.  7. History of bladder cancer:  Reportedly in remission. Patient will continue to follow up with his primary urologist.    Code Status: Full Code.  Family Communication:  Disposition Plan: Per vascular surgery. Have discussed with Dr Myra Gianotti today. Patient is stable from medical viewpoint, and no further medical input is needed. Patient has been transferred to Dr Estanislado Spire service today, and he will determine disposition.     Brief narrative: This is a 56 year old gentleman,  With history of HTN, Migraine headaches, bladder cancer in remission on regular follow up with urologist, presenting with 4 weeks of epigastric pain, radiates to his back, as well as approximately 30 pound weight loss over the past 3 months. He denies any nausea, vomiting, or diarrhea. He does have some occasional bright red blood in the stool which has been attributed to hemorrhoids. Patient had a colonoscopy approximately 2-1/2 years ago. He has utilized 6-8 BC powder daily for the past 30 years, for headaches. The patient saw his gastroenterologist Dr. Christella Hartigan who sent him for a CT abdomen and pelvis which revealed 5.2 cm infrarenal abdominal aortic aneurysm.  aortic aneurysm, he was sent in for evaluation by the surgeon.    Consultants:  Dr Durene Cal, vascular surgeon.   Procedures:  Abdominal/Pelvic CT scan.   Abdominal aortic endovascular stent graft , 03/25/12.  Antibiotics:  Cefuroxime 03/25/12>>>  HPI/Subjective: C/O Bladder discomfort and pain, from Foley catheter.   Objective: Vital signs in last 24 hours: Temp:  [97.7 F (36.5 C)-99.3 F (37.4 C)] 99.3 F (37.4 C) (09/15 0700) Pulse Rate:  [54-90] 72  (09/15 0700) Resp:  [7-22] 17  (09/15 0700) BP: (105-148)/(55-76) 105/68 mmHg (09/15 0700) SpO2:  [88 %-100 %] 95 % (09/15 0700) Arterial Line BP: (98-149)/(47-78) 108/48 mmHg (09/15 0325) Weight:  [88.361 kg (194 lb 12.8 oz)] 88.361 kg (194 lb 12.8 oz) (09/14 1341) Weight change: 4.808 kg (10 lb 9.6 oz) Last BM Date: 03/24/12  Intake/Output from previous day: 09/14 0701 - 09/15 0700 In: 6280 [P.O.:1080; I.V.:5200] Out: 3605 [Urine:3555; Blood:50]  Physical Exam: General: Not in acute pain, alert, communicative, fully oriented, not short of breath at rest. HEENT:  No clinical pallor, no jaundice, no conjunctival injection or discharge. Hydration is fair.  NECK:  Supple, JVP not seen, no carotid  bruits, no palpable lymphadenopathy, no palpable goiter. CHEST:  Clinically clear to auscultation, no wheezes, no crackles. HEART:  Sounds 1 and 2 heard, normal, regular, no murmurs. ABDOMEN:  Full, soft, non-tender, no guarding, normal bowel sounds heard. GENITALIA:  Not examined.  LOWER EXTREMITIES:  No pitting edema, palpable peripheral pulses. MUSCULOSKELETAL SYSTEM:  Generalized osteoarthritic changes, otherwise, normal. CENTRAL NERVOUS SYSTEM:  No focal neurologic deficit on gross examination.  Lab Results:  Basename 03/26/12 0431 03/25/12 1209  WBC 21.1* 10.2  HGB 12.8* 13.2  HCT 36.2* 38.3*  PLT 232 220    Basename 03/26/12 0431 03/25/12 1209  NA 138 139  K 3.7 4.1  CL 102 106  CO2 27 28  GLUCOSE 121* 93  BUN 10 15  CREATININE 0.59 0.57  CALCIUM 8.4 8.3*   Recent Results (from the past 240 hour(s))  SURGICAL PCR SCREEN     Status: Abnormal   Collection Time   03/25/12 12:56 AM      Component Value Range Status Comment   MRSA, PCR POSITIVE (*) NEGATIVE Final    Staphylococcus aureus POSITIVE (*) NEGATIVE Final      Studies/Results: Ct Abdomen Pelvis W Contrast  03/24/2012  *RADIOLOGY REPORT*  Clinical Data: Abdominal pain.  30 pounds weight loss over past 2 months.  Lower GI bleeding.  Bladder carcinoma.  CT ABDOMEN AND PELVIS WITH CONTRAST  Technique:  Multidetector CT imaging of the abdomen and pelvis was performed following the standard protocol during bolus administration of intravenous contrast.  Contrast: OMNIPAQUE IOHEXOL 300 MG/ML  SOLN  Comparison: None.  Findings: The liver, gallbladder, pancreas, spleen, adrenal glands, and kidneys are normal in appearance, except for a tiny right renal cyst.  No evidence of hydronephrosis.  No soft tissue masses or lymphadenopathy identified within the abdomen or pelvis.  An infrarenal abdominal aortic aneurysm is seen which measures 5.2 cm in maximum diameter.  No evidence of aneurysm leak or rupture.  Severe  diverticulosis is seen involving the descending and sigmoid colon.  The distal colon is collapsed which makes evaluation of wall thickness difficult.  No definite peri colonic inflammatory changes identified.  No other inflammatory process or abnormal fluid collections are seen within the abdomen or pelvis.  No evidence of dilated bowel loops or hernia.  IMPRESSION:  1.  Severe sigmoid and descending colon diverticulosis.  No definite radiographic signs of diverticulitis or other acute findings. 2.  5.2 cm infrarenal abdominal aortic aneurysm.  No evidence of aneurysm leak or rupture.  Vascular surgery consultation is advised due to risk of increased risk of aneurysm rupture at this size.   Original Report Authenticated By: Danae Orleans, M.D.    Dg Chest Portable 1 View  03/25/2012  *RADIOLOGY REPORT*  Clinical Data: Stent graft placement  PORTABLE CHEST - 1 VIEW  Comparison: None.  Findings: Normal heart size.  Patchy densities are seen in the right perihilar region and right lung base.  Minimal subsegmental atelectasis at the left base.  Right IJ vein introducer sheath noted with its tip in the right innominate vein.  No pneumothorax and no pleural effusion.  IMPRESSION: Patchy airspace opacities in the right mid and lower lung zones. Follow-up studies until resolution are recommended.   Original Report  Authenticated By: Donavan Burnet, M.D.    Dg Abd Portable 1v  03/25/2012  *RADIOLOGY REPORT*  Clinical Data: Stent graft placement  PORTABLE ABDOMEN - 1 VIEW  Comparison: 03/24/2012  Findings: Aortobi-iliac stent graft has been placed.  It extends from mid L1 to the S1 region.  No disproportionate dilatation of bowel.  Contrast is present throughout the colon.  No obvious free intraperitoneal gas.  IMPRESSION: Aortobi-iliac stent graft placement.  Nonobstructive bowel gas pattern.   Original Report Authenticated By: Donavan Burnet, M.D.     Medications: Scheduled Meds:    . cefUROXime (ZINACEF)  IV   1.5 g Intravenous Q12H  . Chlorhexidine Gluconate Cloth  6 each Topical Q0600  . docusate sodium  100 mg Oral Daily  . fentaNYL      . fentaNYL      . hydrochlorothiazide  25 mg Oral Daily  . HYDROmorphone      . lisinopril  20 mg Oral Daily  . mupirocin ointment  1 application Nasal BID  . nicotine  14 mg Transdermal Daily  . pantoprazole  40 mg Oral Q1200  . sodium chloride  3 mL Intravenous Q12H  . sodium chloride  3 mL Intravenous Q12H  . white petrolatum      . DISCONTD: pantoprazole  40 mg Oral Q1200   Continuous Infusions:    . sodium chloride    . DOPamine    . DISCONTD: sodium chloride 100 mL/hr at 03/25/12 1800   PRN Meds:.sodium chloride, sodium chloride, acetaminophen, acetaminophen, alum & mag hydroxide-simeth, bisacodyl, fentaNYL, guaiFENesin-dextromethorphan, hydrALAZINE, HYDROcodone-acetaminophen, labetalol, magnesium sulfate 1 - 4 g bolus IVPB, metoprolol, morphine injection, ondansetron (ZOFRAN) IV, ondansetron, oxyCODONE-acetaminophen, phenol, potassium chloride, senna-docusate, sodium chloride, zolpidem, DISCONTD: acetaminophen, DISCONTD: acetaminophen DISCONTD: alum & mag hydroxide-simeth, DISCONTD: fentaNYL, DISCONTD: heparin 6000 unit irrigation, DISCONTD:  HYDROmorphone (DILAUDID) injection, DISCONTD: iodixanol, DISCONTD:  morphine injection, DISCONTD: ondansetron, DISCONTD: oxyCODONE, DISCONTD: oxyCODONE, DISCONTD: senna-docusate    LOS: 2 days   Gabi Mcfate,CHRISTOPHER  [facility] Pager [phone]. If 8PM-8AM, please contact night-coverage at [email or web address], password [facility] 03/26/2012, 10:33 AM  LOS: 2 days

## 2012-03-26 NOTE — Progress Notes (Signed)
MEDICATION RELATED CONSULT NOTE - INITIAL   Pharmacy Consult for Adjustment of antibiotics for renal function  Allergies  Allergen Reactions  . Codeine     Nightmares    Patient Measurements: Height: 5\' 9"  (175.3 cm) Weight: 194 lb 12.8 oz (88.361 kg) IBW/kg (Calculated) : 70.7   Vital Signs: Temp: 99.3 F (37.4 C) (09/15 0700) Temp src: Oral (09/15 0700) BP: 105/68 mmHg (09/15 0700) Pulse Rate: 72  (09/15 0700) Intake/Output from previous day: 09/14 0701 - 09/15 0700 In: 6280 [P.O.:1080; I.V.:5200] Out: 3605 [Urine:3555; Blood:50] Intake/Output from this shift:    Labs:  [facility] 03/26/12 0431 03/25/12 1209 03/25/12 0550 03/24/12 1123  WBC 21.1* 10.2 12.0* --  HGB 12.8* 13.2 14.8 --  HCT 36.2* 38.3* 41.6 --  PLT 232 220 301 --  APTT -- 31 -- --  CREATININE 0.59 0.57 0.70 --  LABCREA -- -- -- --  CREATININE 0.59 0.57 0.70 --  CREAT24HRUR -- -- -- --  MG -- 2.0 -- --  PHOS -- -- -- --  ALBUMIN -- -- 3.5 4.1  PROT -- -- 6.1 7.0  ALBUMIN -- -- 3.5 4.1  AST -- -- 11 17  ALT -- -- 5 11  ALKPHOS -- -- 87 82  BILITOT -- -- 0.4 0.9  BILIDIR -- -- -- --  IBILI -- -- -- --   Estimated Creatinine Clearance: 112.1 ml/min (by C-G formula based on Cr of 0.59).   Microbiology: Recent Results (from the past 720 hour(s))  SURGICAL PCR SCREEN     Status: Abnormal   Collection Time   03/25/12 12:56 AM      Component Value Range Status Comment   MRSA, PCR POSITIVE (*) NEGATIVE Final    Staphylococcus aureus POSITIVE (*) NEGATIVE Final     Medical History: Past Medical History  Diagnosis Date  . Colon polyps   . Bladder cancer   . Hypertension     Assessment: 46 YOM s/p AAA 9/14. Started on cefuroxime 1.5g IV Q12. SCr stable from yesterday to today (0.57> 0.59). Estimated CrCl >169mL/min. Slight drop in H/H today, but would suspect it is from acute blood loss.  Plan:  No need to adjust antibiotics. Current dose is appropriate for renal function. No limits on  outpatient antibiotics if discharged with a course of antibiotics.  Carl Lee 03/26/2012,10:20 AM

## 2012-03-26 NOTE — Discharge Summary (Signed)
[facility] Discharge Summary   Patient ID:  Carl Lee MRN: [id number] DOB/AGE: [birth year] 56 y.o.  Admit date: 03/24/2012 Discharge date: 03/26/2012 Date of Surgery: 03/24/2012 - 03/25/2012 Surgeon: Moishe Spice): Nada Libman, MD  Admission Diagnosis: AAA AAA  Discharge Diagnoses:  AAA AAA  Secondary Diagnoses: Past Medical History  Diagnosis Date  . Colon polyps   . Bladder cancer   . Hypertension     Procedure(s): ABDOMINAL AORTIC ENDOVASCULAR STENT GRAFT  Discharged Condition: good  HPI:   This is a very pleasant 56 year old gentleman that I was asked to evaluate for an abdominal aortic aneurysm. The patient states that he has been having abdominal pain which radiates to his back for the past several weeks. He has no exacerbating or alleviating factors. He is also had a 30 pound weight loss. He was sent for a CT scan earlier today to rule out the possibility of a underlying neoplasm given his symptoms. The CT scan revealed a 5.2 cm infrarenal abdominal aortic aneurysm without evidence of rupture. He was admitted to the hospital for further evaluation and management.  The patient is medically managed for his hypertension. He began taking an ACE inhibitor 2-3 months ago. He also has a history of bladder cancer. He continues to be a smoker, but is going to quit on his birthday in a few days. He has a grandfather that had a AAA.    Hospital Course:  Carl Lee is a 56 y.o. male is S/P Bilateral groin incisions Procedure(s): ABDOMINAL AORTIC ENDOVASCULAR STENT GRAFT Extubated: POD # 0 Post-op wounds clean, dry, intact or healing well Pt. Ambulating, voiding and taking PO diet without difficulty. Pt pain controlled with PO pain meds. Labs as below Complications: pain control issues.  Pain at groin area and in the back.    Consults:  Treatment Team:  Nada Libman, MD  Significant Diagnostic Studies: CBC Lab Results  Component Value Date   WBC 21.1* 03/26/2012   HGB 12.8* 03/26/2012   HCT 36.2* 03/26/2012   MCV 92.3 03/26/2012   PLT 232 03/26/2012    BMET    Component Value Date/Time   NA 138 03/26/2012 0431   K 3.7 03/26/2012 0431   CL 102 03/26/2012 0431   CO2 27 03/26/2012 0431   GLUCOSE 121* 03/26/2012 0431   BUN 10 03/26/2012 0431   CREATININE 0.59 03/26/2012 0431   CALCIUM 8.4 03/26/2012 0431   GFRNONAA >90 03/26/2012 0431   GFRAA >90 03/26/2012 0431   COAG Lab Results  Component Value Date   INR 1.05 03/25/2012   INR 0.96 03/25/2012     Disposition:  Discharge to :Home   [place], [place]  Home Medication Instructions [id number]   Printed on:03/26/12 0754  Medication Information                    lisinopril-hydrochlorothiazide (PRINZIDE,ZESTORETIC) 20-25 MG per tablet Take 1 tablet by mouth daily.           Glucosamine-Chondroitin-Ca-D3 (TRIPLE FLEX 50+ PO) Take 1 capsule by mouth daily.           Multiple Vitamins-Minerals (CENTRUM SILVER ADULT 50+ PO) Take 1 capsule by mouth daily.            Verbal and written Discharge instructions given to the patient. Wound care per Discharge AVS   Signed: Clinton Gallant [facility] 03/26/2012, 7:54 AM

## 2012-03-26 NOTE — Progress Notes (Signed)
Pt discharged home with wife, per MD order. Discharge instructions reviewed and all questions answered.

## 2012-03-27 LAB — TYPE AND SCREEN
Antibody Screen: NEGATIVE
Unit division: 0
Unit division: 0

## 2012-03-28 NOTE — OR Nursing (Signed)
Two supplies utilized intraoperatively not found in supply section.  Added both supplies to billing but listed as implant.  These items were not implanted 20Fr introducer and Lunderquist wire.  Changes made 03/28/2012 at 1950

## 2012-03-28 NOTE — Discharge Summary (Signed)
Agree.  The patients right groin pain has improved.  He is now voiding without a catheter.  He did have post void residuals of 300.  I have instructed him to call me if he has any problems voiding at home.  Durene Cal

## 2012-03-29 ENCOUNTER — Telehealth: Payer: Self-pay | Admitting: Surgery

## 2012-03-29 ENCOUNTER — Other Ambulatory Visit: Payer: Self-pay | Admitting: *Deleted

## 2012-03-29 ENCOUNTER — Telehealth: Payer: Self-pay

## 2012-03-29 DIAGNOSIS — Z48812 Encounter for surgical aftercare following surgery on the circulatory system: Secondary | ICD-10-CM

## 2012-03-29 DIAGNOSIS — I714 Abdominal aortic aneurysm, without rupture: Secondary | ICD-10-CM

## 2012-03-29 NOTE — Telephone Encounter (Signed)
Pt notified and letter sent.

## 2012-03-29 NOTE — Telephone Encounter (Signed)
Message copied by Phillips Odor on Wed Mar 29, 2012  5:12 PM ------      Message from: Shari Prows      Created: Wed Mar 29, 2012  3:51 PM      Regarding: triage phone call      Contact: [phone]       I called pt regarding his fu appt w/ [facility] and he asked for a nurse to call him. He has general questions regarding his recent surgery evar/aaa repair. Thanks, Drinda Butts

## 2012-03-29 NOTE — Telephone Encounter (Addendum)
Message copied by Shari Prows on Wed Mar 29, 2012  3:47 PM ------      Message from: Melene Plan      Created: Mon Mar 27, 2012 11:11 AM                   ----- Message -----         From: Nada Libman, MD         Sent: 03/25/2012   2:21 PM           To: Reuel Derby, Melene Plan, RN            03/25/2012, the patient had the following procedures:            Procedure:   #1: Bilateral percutaneous ultrasound guided common femoral artery access        #2: Catheter in aorta x2        #3: Abdominal aortogram        #4: Endovascular repair of abdominal aortic aneurysm (2 docking limbs)                  He will need a followup with me in one month with a CT angiogram of the abdomen and pelvis  The above pt is scheduled for a fu appt on 05/01/12 at 12:15pm w/ [facility] and a cta prior to that appt @ [facility] at 11am. He is aware of both appts and I also mailed an appt letter. awt

## 2012-03-29 NOTE — Telephone Encounter (Signed)
Message copied by Fredrich Birks on Wed Mar 29, 2012  3:52 PM ------      Message from: Melene Plan      Created: Mon Mar 27, 2012 11:11 AM                   ----- Message -----         From: Nada Libman, MD         Sent: 03/25/2012   2:21 PM           To: Reuel Derby, Melene Plan, RN            03/25/2012, the patient had the following procedures:            Procedure:   #1: Bilateral percutaneous ultrasound guided common femoral artery access        #2: Catheter in aorta x2        #3: Abdominal aortogram        #4: Endovascular repair of abdominal aortic aneurysm (2 docking limbs)                  He will need a followup with me in one month with a CT angiogram of the abdomen and pelvis

## 2012-03-29 NOTE — Telephone Encounter (Signed)
Phone call ret'd to pt.  States that he has a soreness/ pain in lower back on each side of spine, beneathe rib cage.  States he did not have this pain prior to surgery, and noticed this since getting home from hospital on Sunday.  Rates pain at "6" on 1-10 scale.  Also reports that his head is "sore to touch".  Denies fever/ chills/ nausea or vomiting.  Also states when he breathes he hurts in the lower stomach from center to the sides.  Denies shortness of breath.  States when he urinates, he starts stream like normal, but has difficulty emptying, stating it takes longer to empty.  Advised will discuss w/ MD in AM, and call pt. With recommendations.  Encour. to go to the ER if symptoms worsen tonight.  Agrees w/ plan.

## 2012-03-30 ENCOUNTER — Other Ambulatory Visit: Payer: Self-pay | Admitting: Vascular Surgery

## 2012-03-30 ENCOUNTER — Ambulatory Visit (INDEPENDENT_AMBULATORY_CARE_PROVIDER_SITE_OTHER): Payer: BC Managed Care – PPO | Admitting: Vascular Surgery

## 2012-03-30 ENCOUNTER — Encounter: Payer: Self-pay | Admitting: Vascular Surgery

## 2012-03-30 VITALS — BP 127/73 | HR 69 | Resp 16 | Ht 69.0 in | Wt 183.0 lb

## 2012-03-30 DIAGNOSIS — I714 Abdominal aortic aneurysm, without rupture: Secondary | ICD-10-CM

## 2012-03-30 LAB — URINALYSIS, ROUTINE W REFLEX MICROSCOPIC
Bilirubin Urine: NEGATIVE
Glucose, UA: NEGATIVE mg/dL
Hgb urine dipstick: NEGATIVE
Ketones, ur: NEGATIVE mg/dL
pH: 7 (ref 5.0–8.0)

## 2012-03-30 NOTE — Telephone Encounter (Signed)
Discussed pt's symptoms with Dr. Darrick Penna.  Rec'd v.o. To obtain UA, C & S  And schedule appt. Today for eval.  Pt. Advised of need to submit urine sample 1 hr. prior to office appt. At 1:45 pm.  Verb. Understanding.

## 2012-03-30 NOTE — Progress Notes (Addendum)
Patient is postoperative day #5 status post [facility] endovascular stent graft repair and abdominal aortic aneurysm. He states that since he was discharged from the hospital he has been having increasing pain bilateral flanks and inner thighs.  He states that he is unable to sleep at night anything that touches his skin makes him feel uncomfortable. He states that even his scalp feels tender. He has not been taking his pain medication because he states he does have nightmares. He denies any fever or chills. He states that the abdominal pain he had preoperatively has now resolved. A urinalysis was sent earlier today and is pending.  Physical exam: Filed Vitals:   03/30/12 1407  BP: 127/73  Pulse: 69  Resp: 16  Height: 5\' 9"  (1.753 m)  Weight: 183 lb (83.008 kg)  SpO2: 100%   2+ femoral pulses bilaterally without hematoma. There is some ecchymosis of the skin.  Abdomen is soft the patient complains of periumbilical and epigastric tenderness but he is easily distractible from this. There is no palpable mass. Lower extremities he has 2+ dorsalis pedis pulses bilaterally there is no significant lower extremity edema Chest is clear to auscultation bilaterally  Assessment: Diffuse aches and pains of unknown etiology post procedure. We will followup on the patient's urinalysis later today. He was switched from oxycodone to Vicodin. I'm not sure what the etiology of his aches and pains are this could potentially be inflammatory from the stent graft. However I believe expected management would be best for now. If his urinalysis is negative we will see how his symptoms progress before any further intervention is considered. I do not believe he has any evidence of infection at his groin puncture sites.  Plan: Followup urinalysis later today, Vicodin #40 dispensed, followup Dr. Myra Gianotti with his regular scheduled appointment  Fabienne Bruns, MD [facility] [place] Office:  [phone] Pager: [phone]   Urinalysis negative pt called and notified of results  Fabienne Bruns, MD [facility] [place] Office: [phone] Pager: [phone]

## 2012-04-11 ENCOUNTER — Telehealth: Payer: Self-pay | Admitting: Gastroenterology

## 2012-04-11 NOTE — Telephone Encounter (Signed)
Let him know I am happy to write a letter if needed.  I will need exact information about who to write it to, address information.

## 2012-04-11 NOTE — Telephone Encounter (Signed)
Pt was advised he would need to speak with the hospital regarding a letter for the admission and he would also like to thank Dr Christella Hartigan for all he did for him and that he is very grateful.

## 2012-04-11 NOTE — Telephone Encounter (Signed)
Pt will get the information together and call back

## 2012-04-25 ENCOUNTER — Telehealth: Payer: Self-pay | Admitting: Gastroenterology

## 2012-04-25 ENCOUNTER — Telehealth: Payer: Self-pay

## 2012-04-25 NOTE — Telephone Encounter (Signed)
Pt. Called with the following complaints; c/o tingling in left side and left leg, numbness from left thigh to knee, and pain/tenderness from left buttocks to left knee. Denies any swelling/ signs of inflammation.  Also, c/o pain across lower back.   Pt. states when he initially wakes up he feels fine, and then the above symptoms progress throughout the day.  States "I don't know if I am doing too much".  "I am very tired and my legs feel very tired."  Has appt. 10/21 with Dr. Myra Gianotti.  Will make him aware of pt's. symptoms.

## 2012-04-27 NOTE — Telephone Encounter (Signed)
RE: recommendations      Nada Libman, MD     Sent: Wed April 26, 2012  7:56 PM    To: Conley Simmonds Bitania Shankland, RN       Carl Lee    MRN: [id number] DOB: 09-22-1955    Pt Home: [id number]        Entered: [phone]            Message     Please move his appointment up to this Monday.  He needs a CTA abdomen and pelvis with bilateral lower extremity runoff prior

## 2012-04-28 ENCOUNTER — Encounter: Payer: Self-pay | Admitting: Surgery

## 2012-04-28 NOTE — Telephone Encounter (Signed)
Left message on machine to call back  

## 2012-05-01 ENCOUNTER — Ambulatory Visit
Admission: RE | Admit: 2012-05-01 | Discharge: 2012-05-01 | Disposition: A | Discharge status: Home / Self Care | Payer: BC Managed Care – PPO | Source: Ambulatory Visit | Attending: Surgery | Admitting: Surgery

## 2012-05-01 ENCOUNTER — Encounter: Payer: Self-pay | Admitting: Surgery

## 2012-05-01 ENCOUNTER — Ambulatory Visit (INDEPENDENT_AMBULATORY_CARE_PROVIDER_SITE_OTHER): Payer: BC Managed Care – PPO | Admitting: Surgery

## 2012-05-01 VITALS — BP 111/71 | HR 64 | Resp 16 | Ht 69.0 in | Wt 183.0 lb

## 2012-05-01 DIAGNOSIS — Z9889 Other specified postprocedural states: Secondary | ICD-10-CM | POA: Insufficient documentation

## 2012-05-01 DIAGNOSIS — I714 Abdominal aortic aneurysm, without rupture, unspecified: Secondary | ICD-10-CM

## 2012-05-01 DIAGNOSIS — Z48812 Encounter for surgical aftercare following surgery on the circulatory system: Secondary | ICD-10-CM

## 2012-05-01 MED ORDER — IOHEXOL 350 MG/ML SOLN
80.0000 mL | Freq: Once | INTRAVENOUS | Status: AC | PRN
  Administered 2012-05-01: 80 mL via INTRAVENOUS

## 2012-05-01 NOTE — Progress Notes (Signed)
The patient is back today for followup. He is status post endovascular repair of a symptomatic abdominal aortic aneurysm. This was done using a [employer] device. Date of surgery was 03/25/2012. The patient initially presented with abdominal pain to his GI doctor. A CT scan showed a large abdominal aortic aneurysm. On my examination he had a very tender aorta and therefore I decided to take him to the operating room for repair. This repair was uncomplicated. He came back to the office several days later with complaints of bilateral flank pain and hypersensitivity in both upper thighs as well as numbness in his thighs. He states that the hypersensitivity has gone but he is left with some numbness on each upper thigh and some pain in the lower back.  On examination he has palpable femoral pulses. Both incisions are well-healed.  I have reviewed his CT scan which showed successful exclusion of his aneurysm without evidence of endoleak.  I suspect he has had some form of I nerve contusion/injury from his access site. I suspect this will resolve however I told him he may experience permanent numbness in his thighs. The back pain could be from sitting on the table during his operation. I do not see any significant evidence of back problems on his CT scan. Hopefully his symptoms will continue to resolve over the next several months. I have cleared him to go back to work and have given him no restrictions. He will followup with me in 6 months with an abdominal ultrasound

## 2012-05-01 NOTE — Addendum Note (Signed)
Addended by: Sharee Pimple on: 05/01/2012 01:51 PM   Modules accepted: Orders

## 2012-05-01 NOTE — Telephone Encounter (Signed)
Unable to reach pt I will await a call from the pt

## 2012-05-01 NOTE — Telephone Encounter (Signed)
Left message on machine to call back  

## 2012-05-17 ENCOUNTER — Encounter: Payer: Self-pay | Admitting: Gastroenterology

## 2012-05-26 ENCOUNTER — Encounter: Payer: Self-pay | Admitting: Gastroenterology

## 2012-06-11 HISTORY — PX: COLONOSCOPY: SHX174

## 2012-06-19 ENCOUNTER — Telehealth: Payer: Self-pay | Admitting: *Deleted

## 2012-06-19 NOTE — Telephone Encounter (Signed)
Message copied by Inocente Salles on Mon Jun 19, 2012 10:06 AM ------      Message from: Rachael Fee      Created: Mon Jun 19, 2012  8:45 AM       He should be fine.  Anneurism was repaired endovasularly 2-3 months ago.                  ----- Message -----         From: Inocente Salles, RN         Sent: 06/19/2012   7:52 AM           To: Rachael Fee, MD            Dr. Christella Hartigan,      This pt is coming in for a PV tomorrow. His colonoscopy is 07-03-12.  I was getting his chart ready and noticed his recent hx of aneurysm surgery.  I know you are aware of this.  Is there any reason he should hold off on his colonoscopy or is it ok to proceed?  I just felt I should double check given his recent surgery.            Thank you,      Baxter Hire

## 2012-06-20 ENCOUNTER — Ambulatory Visit (AMBULATORY_SURGERY_CENTER): Payer: BC Managed Care – PPO

## 2012-06-20 VITALS — Ht 71.0 in | Wt 191.6 lb

## 2012-06-20 DIAGNOSIS — Z86010 Personal history of colonic polyps: Secondary | ICD-10-CM

## 2012-06-20 DIAGNOSIS — Z8551 Personal history of malignant neoplasm of bladder: Secondary | ICD-10-CM

## 2012-06-20 DIAGNOSIS — Z1211 Encounter for screening for malignant neoplasm of colon: Secondary | ICD-10-CM

## 2012-06-20 MED ORDER — MOVIPREP 100 G PO SOLR: ORAL | Status: DC

## 2012-07-03 ENCOUNTER — Ambulatory Visit (AMBULATORY_SURGERY_CENTER): Payer: BC Managed Care – PPO | Admitting: Gastroenterology

## 2012-07-03 ENCOUNTER — Encounter: Payer: Self-pay | Admitting: Gastroenterology

## 2012-07-03 VITALS — BP 118/65 | HR 71 | Temp 97.7°F | Resp 25 | Ht 71.0 in | Wt 191.0 lb

## 2012-07-03 DIAGNOSIS — Z8551 Personal history of malignant neoplasm of bladder: Secondary | ICD-10-CM

## 2012-07-03 DIAGNOSIS — Z1211 Encounter for screening for malignant neoplasm of colon: Secondary | ICD-10-CM

## 2012-07-03 DIAGNOSIS — D126 Benign neoplasm of colon, unspecified: Secondary | ICD-10-CM

## 2012-07-03 DIAGNOSIS — Z86010 Personal history of colonic polyps: Secondary | ICD-10-CM

## 2012-07-03 MED ORDER — SODIUM CHLORIDE 0.9 % IV SOLN: 500.0000 mL | INTRAVENOUS | Status: DC

## 2012-07-03 NOTE — Progress Notes (Signed)
Patient did not experience any of the following events: a burn prior to discharge; a fall within the facility; wrong site/side/patient/procedure/implant event; or a hospital transfer or hospital admission upon discharge from the facility. (G8907) Patient did not have preoperative order for IV antibiotic SSI prophylaxis. (G8918)  

## 2012-07-03 NOTE — Op Note (Signed)
[facility] [address] [place], [address]   COLONOSCOPY PROCEDURE REPORT  PATIENT: Carl Lee, Carl Lee.  MR#: [id number] BIRTHDATE: [birth year] , 56  yrs. old GENDER: Male ENDOSCOPIST: Rachael Fee, MD PROCEDURE DATE:  07/03/2012 PROCEDURE:   Colonoscopy with snare polypectomy ASA CLASS:   Class III INDICATIONS:Two adenomatous polyps 2010 (one was >1cm). MEDICATIONS: Fentanyl 50 mcg IV, Versed 5 mg IV, and These medications were titrated to patient response per physician's verbal order  DESCRIPTION OF PROCEDURE:   After the risks benefits and alternatives of the procedure were thoroughly explained, informed consent was obtained.  A digital rectal exam revealed no abnormalities of the rectum.   The LB CF-[id number]  endoscope was introduced through the anus and advanced to the cecum, which was identified by both the appendix and ileocecal valve. No adverse events experienced.   The quality of the prep was good, using MoviPrep  The instrument was then slowly withdrawn as the colon was fully examined.  COLON FINDINGS: One small polyp was found, removed and sent to pathology.  This was diminutive, located in ascending segment, removed with cold snare (path jar 1).  There were multiple diverticulum throughout left colon.The examination was otherwise normal.  Retroflexed views revealed no abnormalities. The time to cecum=2 minutes 52 seconds.  Withdrawal time=11 minutes 04 seconds. The scope was withdrawn and the procedure completed. COMPLICATIONS: There were no complications.  ENDOSCOPIC IMPRESSION: One small polyp was found, removed and sent to pathology. There were multiple diverticulum throughout left colon.The examination was otherwise normal.  RECOMMENDATIONS: Given your personal history of adenomatous (pre-cancerous) polyps, you will need a repeat colonoscopy in 5 years even if the polyp removed today is not adenomatous.  You will receive a letter  within 1-2 weeks with the results of your biopsy as well as final recommendations.  Please call my office if you have not received a letter after 3 weeks.   eSigned:  Rachael Fee, MD 07/03/2012 3:07 PM   cc: Adelfa Koh

## 2012-07-03 NOTE — Patient Instructions (Addendum)

## 2012-07-04 ENCOUNTER — Telehealth: Payer: Self-pay | Admitting: *Deleted

## 2012-07-04 NOTE — Telephone Encounter (Signed)
Left message on number given in admitting yesterday, ewm

## 2012-07-15 ENCOUNTER — Encounter: Payer: Self-pay | Admitting: Gastroenterology

## 2012-09-14 ENCOUNTER — Encounter: Payer: Self-pay | Admitting: Family Medicine

## 2012-09-14 ENCOUNTER — Ambulatory Visit (INDEPENDENT_AMBULATORY_CARE_PROVIDER_SITE_OTHER): Payer: BC Managed Care – PPO | Admitting: Family Medicine

## 2012-09-14 VITALS — BP 140/82 | HR 76 | Temp 98.0°F | Ht 71.0 in | Wt 194.0 lb

## 2012-09-14 DIAGNOSIS — M25552 Pain in left hip: Secondary | ICD-10-CM

## 2012-09-14 DIAGNOSIS — M1990 Unspecified osteoarthritis, unspecified site: Secondary | ICD-10-CM | POA: Insufficient documentation

## 2012-09-14 DIAGNOSIS — Z8551 Personal history of malignant neoplasm of bladder: Secondary | ICD-10-CM

## 2012-09-14 DIAGNOSIS — M129 Arthropathy, unspecified: Secondary | ICD-10-CM

## 2012-09-14 DIAGNOSIS — M5417 Radiculopathy, lumbosacral region: Secondary | ICD-10-CM | POA: Insufficient documentation

## 2012-09-14 DIAGNOSIS — G43909 Migraine, unspecified, not intractable, without status migrainosus: Secondary | ICD-10-CM | POA: Insufficient documentation

## 2012-09-14 DIAGNOSIS — M25559 Pain in unspecified hip: Secondary | ICD-10-CM

## 2012-09-14 DIAGNOSIS — I10 Essential (primary) hypertension: Secondary | ICD-10-CM

## 2012-09-14 DIAGNOSIS — Z8669 Personal history of other diseases of the nervous system and sense organs: Secondary | ICD-10-CM

## 2012-09-14 DIAGNOSIS — Z87891 Personal history of nicotine dependence: Secondary | ICD-10-CM

## 2012-09-14 DIAGNOSIS — I714 Abdominal aortic aneurysm, without rupture: Secondary | ICD-10-CM

## 2012-09-14 NOTE — Progress Notes (Signed)
Subjective:    Patient ID: Carl Lee, male    DOB: [birth year], 57 y.o.   MRN: [id number]  HPI CC: new pt to establish  Prior saw Dr. Vear Clock  HTN - over last year bp has been elevated.  Brings log of bp ranging 101-142/60-90.  Lisinopril/hctz 20/25 once daily started 1 year ago.  Occasional headache.  No low BP or dizziness.  H/o migraines in past - told has empty sella syndrome.  Decided against operation.  Headaches have actually improved.  No alleviating factors.  Prior was taking a lot of goody powders.  H/o AAA - s/p surgery 03/2012 (Dr. Myra Gianotti)   Bladder cancer - sees Dr. Orson Slick at [facility] - Q6 mo. Has hydrocele - followed by Dr. Orson Slick.  Left leg numbness that starts lateral thigh associated with lateral hip pain to touch.  No back pain.  No shooting pain down left leg.  No weakness.  No falls.  Worse with prolonged standing, better with rest.  3 yrs ago fell in tub and hit lateral hip.  No problems since then until recent surgery.  Preventative: Colonoscopy 06/2012 Due for CPE 01/2013.  Caffeine: 1 cup coffee/day Lives with wife, 1 dog grown children Occupation: Optician, dispensing - self employed Edu: 2 yrs college Activity: fishing, walking occasionally Diet: good water, fruits/vegetables daily  Medications and allergies reviewed and updated in chart.  Past histories reviewed and updated if relevant as below. Patient Active Problem List  Diagnosis  . Adenomatous polyps  . Aortic aneurysm  . Epigastric abdominal pain  . Tobacco abuse  . Hypertension  . History of bladder cancer  . Abdominal aneurysm without mention of rupture   Past Medical History  Diagnosis Date  . Colon polyps   . Bladder cancer 08/16/2008    Orson Slick)  . Hypertension   . AAA (abdominal aortic aneurysm) 03/25/12    s/p repair  . Hx of migraines   . Contact dermatitis     atypical Orson Aloe)  . Arthritis   . Childhood asthma   . Environmental allergies     improved as ages    Past Surgical History  Procedure Laterality Date  . Tonsillectomy and adenoidectomy  1973  . Vasectomy    . Bladder cancer      Orson Slick)  . Abdominal aortic aneurysm repair  03/25/12  . Colonoscopy  06/2012    hyperplastic polyp, diverticulosis (jacobs) rec rpt 5 yrs   History  Substance Use Topics  . Smoking status: Former Smoker -- 1.00 packs/day for 30 years    Types: Cigarettes    Quit date: 03/24/2012  . Smokeless tobacco: Never Used  . Alcohol Use: Yes     Comment: Rare   Family History  Problem Relation Age of Onset  . Diabetes Mother   . Arrhythmia Mother     pacemaker  . COPD Mother   . Cancer Mother     Bladder  . Diabetes Father   . CAD Father     CHF, MI  . Dementia Father   . Diabetes Sister   . Colon cancer Neg Hx   . Diabetes Brother    Allergies  Allergen Reactions  . Codeine Other (See Comments)    Nightmares   Current Outpatient Prescriptions on File Prior to Visit  Medication Sig Dispense Refill  . aspirin 81 MG tablet Take 81 mg by mouth daily.      [name] lisinopril-hydrochlorothiazide (PRINZIDE,ZESTORETIC) 20-25 MG per tablet Take 1 tablet by mouth daily.      [name]  Multiple Vitamins-Minerals (CENTRUM SILVER ADULT 50+ PO) Take 1 capsule by mouth daily.       No current facility-administered medications on file prior to visit.     Review of Systems  Constitutional: Negative for fever, chills, activity change, appetite change, fatigue and unexpected weight change.  HENT: Negative for hearing loss and neck pain.   Eyes: Negative for visual disturbance.  Respiratory: Negative for cough, chest tightness, shortness of breath and wheezing.   Gastrointestinal: Negative for nausea, vomiting, abdominal pain, diarrhea, constipation, blood in stool and abdominal distention.  Genitourinary: Negative for hematuria and difficulty urinating.  Musculoskeletal: Negative for myalgias and arthralgias.  Skin: Negative for rash.  Neurological: Positive for  headaches. Negative for dizziness, seizures and syncope.  Hematological: Does not bruise/bleed easily.  Psychiatric/Behavioral: Negative for dysphoric mood. The patient is not nervous/anxious.        Objective:   Physical Exam  Nursing note and vitals reviewed. Constitutional: He is oriented to person, place, and time. He appears well-developed and well-nourished. No distress.  HENT:  Head: Normocephalic and atraumatic.  Right Ear: Hearing, tympanic membrane, external ear and ear canal normal.  Left Ear: Hearing, tympanic membrane, external ear and ear canal normal.  Nose: Nose normal.  Mouth/Throat: Oropharynx is clear and moist. No oropharyngeal exudate.  Eyes: Conjunctivae and EOM are normal. Pupils are equal, round, and reactive to light. No scleral icterus.  Neck: Normal range of motion. Neck supple. Carotid bruit is not present. No thyromegaly present.  Cardiovascular: Normal rate, regular rhythm, normal heart sounds and intact distal pulses.   No murmur heard. Pulses:      Radial pulses are 2+ on the right side, and 2+ on the left side.  Pulmonary/Chest: Effort normal and breath sounds normal. No respiratory distress. He has no wheezes. He has no rales.  Abdominal: Soft. Bowel sounds are normal. He exhibits no distension and no mass. There is no tenderness. There is no rebound and no guarding.  Musculoskeletal: Normal range of motion. He exhibits no edema.  Tender to palpation at L GTB and inferior to joint  Lymphadenopathy:    He has no cervical adenopathy.  Neurological: He is alert and oriented to person, place, and time.  CN grossly intact, station and gait intact 5/5 strength BLE 2+ patellar DTRs   Skin: Skin is warm and dry. No rash noted.  Psychiatric: He has a normal mood and affect. His behavior is normal. Judgment and thought content normal.       Assessment & Plan:

## 2012-09-14 NOTE — Assessment & Plan Note (Signed)
Chronic, stable. Continue med. 

## 2012-09-14 NOTE — Assessment & Plan Note (Signed)
S/p repair Carl Lee.)

## 2012-09-14 NOTE — Assessment & Plan Note (Signed)
Encouraged continued abstinence. 

## 2012-09-14 NOTE — Assessment & Plan Note (Signed)
Followed by urology.   

## 2012-09-14 NOTE — Patient Instructions (Addendum)
Return in July for physical. Good to see you today.  Call us with questions. Low cholesterol diet handout provided.

## 2012-09-14 NOTE — Assessment & Plan Note (Signed)
Chronic.  Takes glucosamine and tylenol PRN

## 2012-09-14 NOTE — Assessment & Plan Note (Signed)
Strength intact, DTRs intact. Not consistent with meralgia. ?GTBursitis vs from remote trauma - consider xrays if persists vs referral for steroid injection. discussed stretching exercises.

## 2012-09-24 ENCOUNTER — Encounter: Payer: Self-pay | Admitting: Family Medicine

## 2012-10-03 ENCOUNTER — Encounter: Payer: Self-pay | Admitting: Family Medicine

## 2012-10-30 ENCOUNTER — Ambulatory Visit: Payer: BC Managed Care – PPO | Admitting: Surgery

## 2012-10-30 ENCOUNTER — Ambulatory Visit (INDEPENDENT_AMBULATORY_CARE_PROVIDER_SITE_OTHER): Payer: BC Managed Care – PPO | Admitting: Family Medicine

## 2012-10-30 ENCOUNTER — Encounter: Payer: Self-pay | Admitting: Family Medicine

## 2012-10-30 VITALS — BP 138/88 | HR 80 | Temp 98.1°F | Wt 191.8 lb

## 2012-10-30 DIAGNOSIS — M79609 Pain in unspecified limb: Secondary | ICD-10-CM

## 2012-10-30 DIAGNOSIS — M79605 Pain in left leg: Secondary | ICD-10-CM

## 2012-10-30 DIAGNOSIS — R1030 Lower abdominal pain, unspecified: Secondary | ICD-10-CM | POA: Insufficient documentation

## 2012-10-30 DIAGNOSIS — R109 Unspecified abdominal pain: Secondary | ICD-10-CM

## 2012-10-30 MED ORDER — NORCO 5-325 MG PO TABS: 1.0000 | ORAL_TABLET | Freq: Three times a day (TID) | ORAL | Status: DC | PRN

## 2012-10-30 MED ORDER — GABAPENTIN 100 MG PO CAPS: 100.0000 mg | ORAL_CAPSULE | Freq: Three times a day (TID) | ORAL | Status: DC

## 2012-10-30 MED ORDER — LISINOPRIL-HYDROCHLOROTHIAZIDE 20-25 MG PO TABS: 1.0000 | ORAL_TABLET | Freq: Every day | ORAL | Status: DC

## 2012-10-30 NOTE — Progress Notes (Signed)
Subjective:    Patient ID: Carl Lee, male    DOB: Mar 03, 1956, 57 y.o.   MRN: [id number]  HPI CC: left leg pain  Prior to aneurysm repair, had RLQ pain that radiated to right lower back.  This resolved after repair.  Started returning in last 3-4 weeks.  Denies BM changes.  Issues with L lower back pain and L leg pain since 03/2012.  Right after AAA repair surgery, started having left lower back and left leg pain/numbness.  Evaluated by [facility], told not due to surgery.  Endorses left lateral upper thigh burning that leads to numbness lower lateral thigh also associated with lateral hip pain to touch. Endorsing left lower back pain as well.  Keeping him up - unable to sleep 2/2 pain.  Occasional shooting pain from left hip to toes worse when prolonged standing.  Better with rest.  Weakness endorsed 2/2 pain.  No shooting pain down left leg. No inciting falls. 3 yrs ago fell in tub and hit lateral hip. No problems from then until recent surgery. Never skin rash.  No bowel/bladder incontinence, fevers/chills.  Doesn't like to take pain medication.  Cannot take hydrocodone generic - filler in tablets causes nightmares.  Does ok with brand norco 5/325. Unable to tolerate muscle relaxants.  Using E cig.  Quit smoking 03/2012.  H/o bladder cancer s/p surgery by Dr .Orson Slick.  Past Medical History  Diagnosis Date  . Bladder cancer 08/16/2008    Orson Slick)  . Hypertension   . AAA (abdominal aortic aneurysm) 03/25/12    s/p repair  . Hx of migraines   . Contact dermatitis     atypical Orson Aloe)  . Arthritis   . Childhood asthma   . Environmental allergies     improved as ages  . Diverticulosis 2013    by CT  . Adenomatous polyp 06/2009    Past Surgical History  Procedure Laterality Date  . Tonsillectomy and adenoidectomy  1973  . Vasectomy    . Bladder cancer      Orson Slick)  . Abdominal aortic aneurysm repair  03/25/12  . Colonoscopy  06/2012    hyperplastic polyp, diverticulosis  (jacobs) rec rpt 5 yrs    Review of Systems Per HPI    Objective:   Physical Exam  Nursing note and vitals reviewed. Constitutional: He is oriented to person, place, and time. He appears well-developed and well-nourished. No distress.  Abdominal: Soft. Bowel sounds are normal. He exhibits no distension and no mass. There is tenderness (RLQ, suprapubic, LLQ). There is guarding (mild). There is no rebound.  Musculoskeletal: He exhibits no edema.  No midline spine tenderness. Evident L paraspinous tightness but no pain Neg SLR bilaterally. Neg FABER No pain with palpation at SIJ, sciatic notch bilaterally Tender to palpation left lateral GTB.  Neurological: He is alert and oriented to person, place, and time. He has normal strength. A sensory deficit is present. He exhibits normal muscle tone. Coordination and gait normal.  Reflex Scores:      Patellar reflexes are 2+ on the right side and 2+ on the left side.      Achilles reflexes are 2+ on the right side and 2+ on the left side. Sensory loss one patch of skin left lateral upper thigh Able to heel/toe walk. 5/5 strength BLE  Skin: Skin is warm and dry. No rash noted.     Scattered residual small hyperpigmented marks on left upper lateral leg and one on left lateral side  Assessment & Plan:

## 2012-10-30 NOTE — Assessment & Plan Note (Signed)
R>L lower abd pain without associated BM changes or fever, persistent for last several weeks. Doubt diverticulitis.  Recommended blood work to further evaluate this.  Pt opts to wait for further evaluation of this after vascular appt.

## 2012-10-30 NOTE — Patient Instructions (Signed)
i've filled norco for you. Also start gabapentin 100mg  twice daily for 1 week then may increase to 3 times daily. I've refilled lisinopril. This could be post shingles pain, or coming from the spine.  We will await vascular surgery evaluation. Return in 3-4 weeks for follow up.

## 2012-10-30 NOTE — Assessment & Plan Note (Addendum)
With associated paresthesias, numbness and radiculopathy. Anticipate left lower lumbar radiculopathy vs post herpetic neuralgia given residual rash No weakness appreciated on exam. Discussed would want to obtain lumbar imaging today to eval for DDD and loss of disc space, eventually possible MRI. Pt desires to see vascular surgery first to eval abd aorta and ensure stable prior to pursuing further evaluation. Will treat with norco which pt tolerates well and start gabapentin for possible neuralgia component.  Discussed titration of gabapentin.

## 2012-11-10 ENCOUNTER — Encounter: Payer: Self-pay | Admitting: Surgery

## 2012-11-13 ENCOUNTER — Encounter (INDEPENDENT_AMBULATORY_CARE_PROVIDER_SITE_OTHER): Payer: BC Managed Care – PPO | Admitting: *Deleted

## 2012-11-13 ENCOUNTER — Ambulatory Visit (INDEPENDENT_AMBULATORY_CARE_PROVIDER_SITE_OTHER): Payer: BC Managed Care – PPO | Admitting: Surgery

## 2012-11-13 ENCOUNTER — Encounter: Payer: Self-pay | Admitting: Surgery

## 2012-11-13 VITALS — BP 110/77 | HR 63 | Resp 16 | Ht 71.0 in | Wt 191.0 lb

## 2012-11-13 DIAGNOSIS — I6529 Occlusion and stenosis of unspecified carotid artery: Secondary | ICD-10-CM

## 2012-11-13 DIAGNOSIS — M79609 Pain in unspecified limb: Secondary | ICD-10-CM

## 2012-11-13 DIAGNOSIS — Z48812 Encounter for surgical aftercare following surgery on the circulatory system: Secondary | ICD-10-CM

## 2012-11-13 DIAGNOSIS — I7411 Embolism and thrombosis of thoracic aorta: Secondary | ICD-10-CM

## 2012-11-13 DIAGNOSIS — I714 Abdominal aortic aneurysm, without rupture: Secondary | ICD-10-CM

## 2012-11-13 DIAGNOSIS — I6509 Occlusion and stenosis of unspecified vertebral artery: Secondary | ICD-10-CM

## 2012-11-13 NOTE — Addendum Note (Signed)
Addended by: Melodye Ped C on: 11/13/2012 10:56 AM   Modules accepted: Orders

## 2012-11-13 NOTE — Progress Notes (Signed)
Chief Complaint  Patient presents with  . AAA    6 month f/u     HISTORY OF PRESENT ILLNESS: The patient is back today for followup. On 03/24/2012, he underwent endovascular repair of a symptomatic 5.2 cm aneurysm. His postoperative CT scan showed a resolution of the aneurysm with no evidence of endoleak. He's back today for followup. He still complains of right lower quadrant pain which was similar to the pain he is experiencing around the time of his aneurysm repair, although it is not as severe. It was exacerbated today by his ultrasound exam. He also complains of some numbness on the lateral side of his left leg as well as pain going into his hip. He has been given a trial of Neurontin, but has not had a significant benefit.  Past Medical History  Diagnosis Date  . Reflux   . Chest pain   . Arthritis   . Joint pain   . Urination frequency   . Hay fever   . GERD (gastroesophageal reflux disease)     takes Zantac and Prilosec as needed  . Hemorrhoids   . History of colonic polyps   . Urinary frequency   . Nocturia   . Diabetes mellitus     takes Metformin bid  . AAA (abdominal aortic aneurysm)   . Carotid artery occlusion     Past Surgical History  Procedure Laterality Date  . Goiter removed  70's  . Goiter    . Thyroid surgery  1980    Goiter removed  . Joint replacement  2005    Left knee   . Joint replacement  2010    Left Hip  . Cataract surgery  2012    bilateral  . Colonoscopy    . Cholecystectomy  10/20/2011    Procedure: LAPAROSCOPIC CHOLECYSTECTOMY;  Surgeon: Clovis Pu. Cornett, MD;  Location: [facility] OR;  Service: General;  Laterality: N/A;    History   Social History  . Marital Status: Married    Spouse Name: N/A    Number of Children: N/A  . Years of Education: N/A   Occupational History  . Not on file.   Social History Main Topics  . Smoking status: Former Smoker    Types: Cigarettes    Quit date: 01/28/1981  . Smokeless tobacco: Not on file   . Alcohol Use: Yes     Comment: occasionally  . Drug Use: No  . Sexually Active: Not Currently   Other Topics Concern  . Not on file   Social History Narrative  . No narrative on file    Family History  Problem Relation Age of Onset  . Cancer Mother     throat  . Anesthesia problems Neg Hx   . Hypotension Neg Hx   . Malignant hyperthermia Neg Hx   . Pseudochol deficiency Neg Hx     Allergies as of 11/13/2012  . (No Known Allergies)    Current Outpatient Prescriptions on File Prior to Visit  Medication Sig Dispense Refill  . aspirin 325 MG tablet Take 325 mg by mouth daily.      . metFORMIN (GLUCOPHAGE) 500 MG tablet Take 500 mg by mouth 2 (two) times daily.       [name] omeprazole (PRILOSEC) 20 MG capsule Take 20 mg by mouth daily.      . ranitidine (ZANTAC) 150 MG capsule Take 150 mg by mouth daily as needed. For acid reflux  No current facility-administered medications on file prior to visit.     REVIEW OF SYSTEMS: Please see history of present illness, otherwise no changes  PHYSICAL EXAMINATION:   Vital signs are BP 164/88  Pulse 68  Ht 5\' 3"  (1.6 m)  Wt 167 lb 1.6 oz (75.796 kg)  BMI 29.61 kg/m2  SpO2 98% General: The patient appears their stated age. HEENT:  No gross abnormalities Pulmonary:  Non labored breathing Abdomen: Soft and non-tender Musculoskeletal: There are no major deformities. Neurologic: No focal weakness or paresthesias are detected, Skin: There are no ulcer or rashes noted. Psychiatric: The patient has normal affect. Cardiovascular: There is a regular rate and rhythm without significant murmur appreciated. No carotid bruit   Diagnostic Studies Ultrasound today shows no evidence of endoleak. The aneurysm has decreased in size. It measures 4.5 x 3.9 today  Assessment: Status post endovascular repair of a symptomatic abdominal aortic aneurysm Plan: The patient is going to have continued workup for his right lower quadrant pain by  his primary care physician. I do not feel that this has anything to do with his aneurysm repair. The numbness and pain in his left hip conceivably could be due to nerve injury at the time of his percutaneous access, however I would assume that this would resolve with time. Additionally this could be related to lower back degenerative disease versus worsening arthritis in his left hip.  From an aneurysm perspective the patient is doing very well. The aneurysm sac continues to decrease in size. I will schedule him to followup with me in one year. At that time I will also obtain carotid Doppler studies, as this was not done in the perioperative period  V. Charlena Cross, M.D. [facility] Office: [phone] Pager:  [phone]

## 2012-11-20 ENCOUNTER — Ambulatory Visit (INDEPENDENT_AMBULATORY_CARE_PROVIDER_SITE_OTHER): Payer: BC Managed Care – PPO | Admitting: Family Medicine

## 2012-11-20 ENCOUNTER — Ambulatory Visit (INDEPENDENT_AMBULATORY_CARE_PROVIDER_SITE_OTHER)
Admission: RE | Admit: 2012-11-20 | Discharge: 2012-11-20 | Disposition: A | Discharge status: Home / Self Care | Payer: BC Managed Care – PPO | Source: Ambulatory Visit | Attending: Family Medicine | Admitting: Family Medicine

## 2012-11-20 ENCOUNTER — Encounter: Payer: Self-pay | Admitting: Family Medicine

## 2012-11-20 DIAGNOSIS — M79605 Pain in left leg: Secondary | ICD-10-CM

## 2012-11-20 DIAGNOSIS — R109 Unspecified abdominal pain: Secondary | ICD-10-CM

## 2012-11-20 DIAGNOSIS — M79609 Pain in unspecified limb: Secondary | ICD-10-CM

## 2012-11-20 LAB — POCT URINALYSIS DIPSTICK
Blood, UA: NEGATIVE
Glucose, UA: NEGATIVE
Leukocytes, UA: NEGATIVE
Nitrite, UA: NEGATIVE
Urobilinogen, UA: 0.2

## 2012-11-20 LAB — CBC WITH DIFFERENTIAL/PLATELET
Basophils Absolute: 0 10*3/uL (ref 0.0–0.1)
Eosinophils Absolute: 0.3 10*3/uL (ref 0.0–0.7)
Lymphocytes Relative: 21.4 % (ref 12.0–46.0)
MCHC: 35.2 g/dL (ref 30.0–36.0)
Neutrophils Relative %: 67.8 % (ref 43.0–77.0)
RBC: 5.09 Mil/uL (ref 4.22–5.81)
RDW: 13.9 % (ref 11.5–14.6)

## 2012-11-20 LAB — COMPREHENSIVE METABOLIC PANEL
ALT: 10 U/L (ref 0–53)
AST: 17 U/L (ref 0–37)
Albumin: 4.1 g/dL (ref 3.5–5.2)
BUN: 22 mg/dL (ref 6–23)
Calcium: 9.2 mg/dL (ref 8.4–10.5)
Chloride: 101 mEq/L (ref 96–112)
Potassium: 4.5 mEq/L (ref 3.5–5.1)

## 2012-11-20 LAB — LIPASE: Lipase: 12 U/L (ref 11.0–59.0)

## 2012-11-20 MED ORDER — GABAPENTIN 300 MG PO CAPS: 300.0000 mg | ORAL_CAPSULE | Freq: Every day | ORAL | Status: DC

## 2012-11-20 NOTE — Assessment & Plan Note (Signed)
Anticipate L lumbar radiculopathy.  Check xray of lower spine today to eval for arthritis burden. Change gabapentin to 300mg  QHS as seems to be helping some, but pt forgets TID dosing. Pt agrees with plan.

## 2012-11-20 NOTE — Addendum Note (Signed)
Addended by: Sydell Axon C on: 11/20/2012 08:55 AM   Modules accepted: Orders

## 2012-11-20 NOTE — Patient Instructions (Addendum)
Change gabapentin to 300mg  at night. Let's check urine today as well as blood work and lower back xrays. We will call you with results.

## 2012-11-20 NOTE — Assessment & Plan Note (Addendum)
Not thought related to recent AAA endovascular repair. Will obtain blood work to further evaluate this pain - start with CMP, CBC, lipase and UA. Will call pt with results.

## 2012-11-20 NOTE — Progress Notes (Signed)
Subjective:    Patient ID: Carl Lee, male    DOB: Oct 28, 1955, 57 y.o.   MRN: [id number]  HPI CC: f/u L leg pain and abd pain  See prior note for details. From prior note: Endorses left lateral upper thigh burning that leads to numbness lower lateral thigh also associated with lateral hip pain to touch. Endorsing left lower back pain as well. Keeping him up - unable to sleep 2/2 pain. Occasional shooting pain from left hip to toes worse when prolonged standing. Better with rest. Weakness endorsed 2/2 pain.  No inciting falls or trauma. 3 yrs ago fell in tub and hit lateral hip. No problems from then until recent surgery. Leg pain started 03/2012. Never skin rash. No bowel/bladder incontinence, fevers/chills. Doesn't like to take pain medication. Cannot take hydrocodone generic - filler in tablets causes nightmares. Does ok with brand norco 5/325. Unable to tolerate muscle relaxants.  This pain was thought to be left lower lumbar radiculopathy vs post herpetic neuralgia given a residual rash that was present on left leg.  I started gabapentin for neuropathic pain component, which has helped him sleep but burning persists.  Noticing some somnolence on this med.  I also provided prescription for norco which he stopped taking 2/2 ineffective.  Also endorses continued R>L lower quadrant intermittent discomfort as well as bilateral lower back pain. Denies fevers/chills, nausea/vomiting, diarrhea.  Since last visit, saw Dr. Myra Gianotti, RLQ pain not thought AAA repair related.  Wt Readings from Last 3 Encounters:  11/20/12 192 lb 8 oz (87.317 kg)  11/13/12 191 lb (86.637 kg)  10/30/12 191 lb 12 oz (86.977 kg)  Body mass index is 26.86 kg/(m^2).  Quit smoking 03/2012.  Past Medical History  Diagnosis Date  . Bladder cancer 08/16/2008    Orson Slick)  . Hypertension   . AAA (abdominal aortic aneurysm) 03/25/12    s/p repair  . Hx of migraines   . Contact dermatitis     atypical Orson Aloe)  .  Arthritis   . Childhood asthma   . Environmental allergies     improved as ages  . Diverticulosis 2013    by CT  . Adenomatous polyp 06/2009   Past Surgical History  Procedure Laterality Date  . Tonsillectomy and adenoidectomy  1973  . Vasectomy    . Bladder cancer      Orson Slick)  . Abdominal aortic aneurysm repair  03/25/12  . Colonoscopy  06/2012    hyperplastic polyp, diverticulosis (jacobs) rec rpt 5 yrs  . Cholecystectomy  08/16/08    Bladder Cancer    Review of Systems Per HPI    Objective:   Physical Exam  Nursing note and vitals reviewed. Constitutional: He appears well-developed and well-nourished. No distress.  Neck: Normal range of motion. Neck supple.  Cardiovascular: Normal rate, regular rhythm, normal heart sounds and intact distal pulses.   No murmur heard. Pulmonary/Chest: Effort normal and breath sounds normal. No respiratory distress. He has no wheezes. He has no rales.  Abdominal: Soft. Normal appearance and bowel sounds are normal. He exhibits no distension and no mass. There is no hepatosplenomegaly. There is tenderness (mild) in the right upper quadrant and periumbilical area. There is no rigidity, no rebound, no guarding and negative Murphy's sign. No hernia.  Musculoskeletal:  No midline lower back pain or paraspinous mm tenderness. Neg SLR on left. Sore to palpation left lateral thigh.  Lymphadenopathy:    He has no cervical adenopathy.       Assessment &  Plan:

## 2012-11-21 ENCOUNTER — Other Ambulatory Visit: Payer: Self-pay | Admitting: *Deleted

## 2012-12-24 ENCOUNTER — Encounter: Payer: Self-pay | Admitting: Family Medicine

## 2012-12-24 ENCOUNTER — Other Ambulatory Visit: Payer: Self-pay | Admitting: Family Medicine

## 2012-12-24 DIAGNOSIS — I714 Abdominal aortic aneurysm, without rupture: Secondary | ICD-10-CM

## 2012-12-24 DIAGNOSIS — I10 Essential (primary) hypertension: Secondary | ICD-10-CM

## 2013-01-02 ENCOUNTER — Other Ambulatory Visit (INDEPENDENT_AMBULATORY_CARE_PROVIDER_SITE_OTHER): Payer: BC Managed Care – PPO

## 2013-01-02 DIAGNOSIS — I714 Abdominal aortic aneurysm, without rupture: Secondary | ICD-10-CM

## 2013-01-02 DIAGNOSIS — I10 Essential (primary) hypertension: Secondary | ICD-10-CM

## 2013-01-02 LAB — LIPID PANEL
Total CHOL/HDL Ratio: 5
Triglycerides: 104 mg/dL (ref 0.0–149.0)

## 2013-01-09 ENCOUNTER — Encounter: Payer: Self-pay | Admitting: Family Medicine

## 2013-01-09 ENCOUNTER — Ambulatory Visit (INDEPENDENT_AMBULATORY_CARE_PROVIDER_SITE_OTHER): Payer: BC Managed Care – PPO | Admitting: Family Medicine

## 2013-01-09 VITALS — BP 132/86 | HR 72 | Temp 98.2°F | Ht 71.0 in | Wt 193.5 lb

## 2013-01-09 DIAGNOSIS — M79605 Pain in left leg: Secondary | ICD-10-CM

## 2013-01-09 DIAGNOSIS — M79609 Pain in unspecified limb: Secondary | ICD-10-CM

## 2013-01-09 DIAGNOSIS — Z87891 Personal history of nicotine dependence: Secondary | ICD-10-CM

## 2013-01-09 DIAGNOSIS — E1169 Type 2 diabetes mellitus with other specified complication: Secondary | ICD-10-CM | POA: Insufficient documentation

## 2013-01-09 DIAGNOSIS — Z8551 Personal history of malignant neoplasm of bladder: Secondary | ICD-10-CM

## 2013-01-09 DIAGNOSIS — E785 Hyperlipidemia, unspecified: Secondary | ICD-10-CM

## 2013-01-09 DIAGNOSIS — I10 Essential (primary) hypertension: Secondary | ICD-10-CM

## 2013-01-09 DIAGNOSIS — Z0000 Encounter for general adult medical examination without abnormal findings: Secondary | ICD-10-CM

## 2013-01-09 NOTE — Assessment & Plan Note (Signed)
Preventative protocols reviewed and updated unless pt declined. Discussed healthy diet and lifestyle.  

## 2013-01-09 NOTE — Patient Instructions (Addendum)
Good to see you today, call us with questions. Let me know if worsening left leg pain or any weakness or loss of function. Continue gabapentin for now. Return as needed or in 1 year for follow up, sooner if leg pain worsening. Continue low cholesterol diet.  Get Dr. Estanislado Spire opinion about your cholesterol levels.

## 2013-01-09 NOTE — Assessment & Plan Note (Signed)
Chronic, stable 

## 2013-01-09 NOTE — Assessment & Plan Note (Signed)
Discussed low chol diet. Discussed possible need for even tighter control given h/o AAA with some continued plaque buildup on latest imaging.

## 2013-01-09 NOTE — Assessment & Plan Note (Signed)
L lumbar radiculopathy vs surgery related. Continue gabapentin. Pt declines further intervention currently.

## 2013-01-09 NOTE — Progress Notes (Signed)
Subjective:    Patient ID: Carl Lee, male    DOB: 1956-07-01, 57 y.o.   MRN: [id number]  HPI CC: CPE  L leg pain/numbness since 03/2013.  Numbness always present.  Pain in lateral leg as well as posterior calf.  No weakness.  Norco didn't help.  Gabapentin does help some.  No sedation with this medication.  H/o bladder cancer - sees uro yearly.  Has had PSA done by urology, not recently.  Smoking - 1/4 ppd.  Some days goes without smoking.  Preventative:  Colonoscopy 06/2012 hyperplastic polyp, diverticulosis (jacobs) rec rpt 5 yrs Prostate cancer screening - no fmhx.  Has had checked in past, normal.  Has had swelling of prostate in past, related to heavy lifting.  Declines screening currently, will reassess each year. [facility]. Flu shot - did not receive.  Caffeine: 1 cup coffee/day  Lives with wife, 1 dog  grown children  Occupation: Optician, dispensing - self employed  Edu: 2 yrs college  Activity: fishing, walking occasionally  Diet: good water, fruits/vegetables daily  Medications and allergies reviewed and updated in chart.  Past histories reviewed and updated if relevant as below. Patient Active Problem List   Diagnosis Date Noted  . Lower abdominal pain 10/30/2012  . Left leg pain 09/14/2012  . Hx of migraines   . Arthritis   . Abdominal aneurysm without mention of rupture 05/01/2012  . Adenomatous polyps 03/24/2012  . Ex-smoker 03/24/2012  . Hypertension 03/24/2012  . History of bladder cancer 03/24/2012   Past Medical History  Diagnosis Date  . Bladder cancer 08/16/2008    Orson Slick)  . Hypertension   . AAA (abdominal aortic aneurysm) 03/25/12    s/p endovascular repair  . Hx of migraines   . Contact dermatitis     atypical Orson Aloe)  . Arthritis   . Childhood asthma   . Environmental allergies     improved as ages  . Diverticulosis 2013    by CT  . Adenomatous polyp 06/2009   Past Surgical History  Procedure  Laterality Date  . Tonsillectomy and adenoidectomy  1973  . Vasectomy    . Bladder cancer      Orson Slick)  . Abdominal aortic aneurysm repair  03/25/12  . Colonoscopy  06/2012    hyperplastic polyp, diverticulosis (jacobs) rec rpt 5 yrs  . Cholecystectomy  08/16/08    Bladder Cancer   History  Substance Use Topics  . Smoking status: Current Every Day Smoker -- 0.25 packs/day for 30 years    Types: Cigarettes    Last Attempt to Quit: 03/24/2012  . Smokeless tobacco: Never Used  . Alcohol Use: Yes     Comment: Rare   Family History  Problem Relation Age of Onset  . Diabetes Mother   . Arrhythmia Mother     pacemaker  . Cancer Mother     Bladder  . COPD Mother   . Thyroid disease Mother   . Hyperlipidemia Mother   . Hypertension Mother   . Diabetes Father   . CAD Father     CHF, MI  . Dementia Father   . Diabetes Sister   . Colon cancer Neg Hx   . Diabetes Brother    Allergies  Allergen Reactions  . Codeine Other (See Comments)    Nightmares   Current Outpatient Prescriptions on File Prior to Visit  Medication Sig Dispense Refill  . acetaminophen (TYLENOL) 500 MG tablet Take 500 mg by mouth  every 6 (six) hours as needed for pain.      [name] aspirin 81 MG tablet Take 81 mg by mouth daily.      [name] gabapentin (NEURONTIN) 300 MG capsule Take 1 capsule (300 mg total) by mouth at bedtime.  30 capsule  6  . Glucosamine-Chondroit-Vit C-Mn (GLUCOSAMINE 1500 COMPLEX PO) Take 2 tablets by mouth daily.      [name] lisinopril-hydrochlorothiazide (PRINZIDE,ZESTORETIC) 20-25 MG per tablet Take 1 tablet by mouth daily.  90 tablet  3  . Multiple Vitamins-Minerals (CENTRUM SILVER ADULT 50+ PO) Take 1 capsule by mouth daily.       No current facility-administered medications on file prior to visit.   Review of Systems  Constitutional: Negative for fever, chills, activity change, appetite change, fatigue and unexpected weight change.  HENT: Negative for hearing loss and neck pain.   Eyes:  Negative for visual disturbance.  Respiratory: Negative for cough, chest tightness, shortness of breath and wheezing.   Cardiovascular: Negative for chest pain, palpitations and leg swelling.  Gastrointestinal: Negative for nausea, vomiting, abdominal pain, diarrhea, constipation, blood in stool and abdominal distention.  Genitourinary: Negative for hematuria and difficulty urinating.  Musculoskeletal: Negative for myalgias and arthralgias.  Skin: Negative for rash.  Neurological: Negative for dizziness, seizures, syncope and headaches.  Hematological: Negative for adenopathy. Does not bruise/bleed easily.  Psychiatric/Behavioral: Negative for dysphoric mood. The patient is not nervous/anxious.        Objective:   Physical Exam  Nursing note and vitals reviewed. Constitutional: He is oriented to person, place, and time. He appears well-developed and well-nourished. No distress.  HENT:  Head: Normocephalic and atraumatic.  Right Ear: External ear normal.  Left Ear: External ear normal.  Nose: Nose normal.  Mouth/Throat: Oropharynx is clear and moist. No oropharyngeal exudate.  Eyes: Conjunctivae and EOM are normal. Pupils are equal, round, and reactive to light. No scleral icterus.  Neck: Normal range of motion. Neck supple. Carotid bruit is not present. No thyromegaly present.  Cardiovascular: Normal rate, regular rhythm, normal heart sounds and intact distal pulses.   No murmur heard. Pulses:      Radial pulses are 2+ on the right side, and 2+ on the left side.  Pulmonary/Chest: Effort normal and breath sounds normal. No respiratory distress. He has no wheezes. He has no rales.  Abdominal: Soft. Bowel sounds are normal. He exhibits no distension and no mass. There is no tenderness. There is no rebound and no guarding.  Genitourinary:  deferred  Musculoskeletal: He exhibits no edema.  Lymphadenopathy:    He has no cervical adenopathy.  Neurological: He is alert and oriented to  person, place, and time.  CN grossly intact, station and gait intact  Skin: Skin is warm and dry. No rash noted.  Psychiatric: He has a normal mood and affect. His behavior is normal. Judgment and thought content normal.       Assessment & Plan:

## 2013-01-09 NOTE — Assessment & Plan Note (Signed)
Encouraged quit smoking.  Minimal EtOH.

## 2013-01-09 NOTE — Assessment & Plan Note (Signed)
Sees urology yearly.  

## 2013-04-17 ENCOUNTER — Encounter: Payer: Self-pay | Admitting: Family Medicine

## 2013-04-17 ENCOUNTER — Other Ambulatory Visit: Payer: Self-pay | Admitting: Family Medicine

## 2013-04-17 ENCOUNTER — Ambulatory Visit (INDEPENDENT_AMBULATORY_CARE_PROVIDER_SITE_OTHER): Payer: BC Managed Care – PPO | Admitting: Family Medicine

## 2013-04-17 VITALS — BP 118/76 | HR 68 | Temp 98.2°F | Wt 194.5 lb

## 2013-04-17 DIAGNOSIS — R21 Rash and other nonspecific skin eruption: Secondary | ICD-10-CM | POA: Insufficient documentation

## 2013-04-17 DIAGNOSIS — Z23 Encounter for immunization: Secondary | ICD-10-CM

## 2013-04-17 DIAGNOSIS — M79609 Pain in unspecified limb: Secondary | ICD-10-CM

## 2013-04-17 DIAGNOSIS — Z139 Encounter for screening, unspecified: Secondary | ICD-10-CM

## 2013-04-17 DIAGNOSIS — M79605 Pain in left leg: Secondary | ICD-10-CM

## 2013-04-17 DIAGNOSIS — R229 Localized swelling, mass and lump, unspecified: Secondary | ICD-10-CM

## 2013-04-17 MED ORDER — GABAPENTIN 300 MG PO CAPS: 300.0000 mg | ORAL_CAPSULE | Freq: Two times a day (BID) | ORAL | Status: DC

## 2013-04-17 MED ORDER — TRIAMCINOLONE ACETONIDE 0.1 % EX CREA: TOPICAL_CREAM | Freq: Two times a day (BID) | CUTANEOUS | Status: DC

## 2013-04-17 NOTE — Progress Notes (Signed)
  Subjective:    Patient ID: Carl Lee, male    DOB: 05-19-1956, 57 y.o.   MRN: [id number]  HPI CC: L leg pain  L leg pain/numbness since 03/2012 (a few weeks after AAA repair). Now pain stemming from buttock, radiates down posterior lateral leg to toes.  Toes ache as well.  No lower back pain.  Numbness always present left lateral thigh. No weakness. Norco didn't help. Gabapentin 300mg  daily does help some. No sedation with this medication.  Thought L lumbar radiculopathy vs surgery related (AAA repair).  Was taking gabapentin at night to help sleep, now with persistent leg pain. Denies inciting trauma, falls.  No fevers/chills, bowel/bladder incontinence.  Laying down relieves pain.  Prolonged standing and walking and sitting worsens pain.  Pain constant.  Was walking but pain worsened with this.  Sitting and driving worsens pain as well.  Now with bilat lower abd pain with standing. L lump on back of neck last week.  No uri sxs. Also noticed rash on buttock for the last month.  Stent placed in AAA - zenith endovascular graft ([employer])  Past Medical History  Diagnosis Date  . Bladder cancer 08/16/2008    [facility] Slick)  . Hypertension   . AAA (abdominal aortic aneurysm) 03/25/12    s/p endovascular repair  . Hx of migraines   . Contact dermatitis     atypical Orson Aloe)  . Arthritis   . Childhood asthma   . Environmental allergies     improved as ages  . Diverticulosis 2013    severe by CT and colonoscopy  . Adenomatous polyp 06/2009    Past Surgical History  Procedure Laterality Date  . Tonsillectomy and adenoidectomy  1973  . Vasectomy    . Bladder cancer      [facility] Slick)  . Abdominal aortic aneurysm repair  03/25/12  . Colonoscopy  06/2012    hyperplastic polyp, diverticulosis (jacobs) rec rpt 5 yrs  . Cystoscopy  08/16/08    Bladder Cancer    Review of Systems Per HPI    Objective:   Physical Exam  Nursing note and vitals reviewed. Constitutional: He  is oriented to person, place, and time. He appears well-developed and well-nourished. No distress.  Neck:  L occipital LAD  Musculoskeletal: He exhibits no edema.  No midline spine tenderness No parasapinous mm tenderness +SLR on left. Neg FABER No pain at GTB, SIJ or sciatic notch bilaterally  2+ DP on right  Diminished DP on left  Neurological: He is alert and oriented to person, place, and time. He has normal strength. A sensory deficit (diminished to temp sensation of distal L leg) is present. Coordination and gait normal.  Reflex Scores:      Patellar reflexes are 2+ on the right side and 3+ on the left side. 5/5 strength BLE Brisk patellar DTR on left compared to right Diminished achilles DTR bilaterally Able to heel and toe walk but with pain  Skin: Rash noted.  Rough papular rash left superior buttock, no erythema or pruritis       Assessment & Plan:

## 2013-04-17 NOTE — Patient Instructions (Addendum)
Flu shot today. May try higher dose of gabapentin (twice daily). Watch for persistent swelling at neck or new spots. May use steroid cream to rash on buttock. For leg pain - I want to obtain ankle brachial index of lower legs to evaluate for circulation problems. I also want to obtain an MRI of lower back to further evaluate this (may obtain nerve conduction study first depending on radiology recommendation).

## 2013-04-17 NOTE — Assessment & Plan Note (Signed)
Cyst vs LAD.  No evidence of viral URI or HEENT infection. Will monitor for now. Advised if new lumps or persistent lump past 3-4 weeks to notify me for further evaluation - would start with CBC.

## 2013-04-17 NOTE — Assessment & Plan Note (Addendum)
Longstanding pain with paresthesias (>1 yr) but no loss of function. Anticipate L lumbar radiculopathy in known lumbar DDD worst at L4/5 L5/S1 by recent CT scan. However, on exam there is diminished L DP , in known h/o vascular disease. I will obtain both ABIs BLE and lumbar MRI to evaluate for HNP and nerve root compression. If unable to obtain MRI 2/2 h/o AAA stent, will order NCS. In interim, increase gabapentin to 300mg  bid - discussed sedation precautions. Pt agrees with plan.

## 2013-04-17 NOTE — Addendum Note (Signed)
Addended by: Josph Macho A on: 04/17/2013 02:31 PM   Modules accepted: Orders

## 2013-04-17 NOTE — Assessment & Plan Note (Signed)
Eczema vs dry skin dermatitis - treat as steroid responsive dermatosis with TCI cream. Update if spreading or not resolving with treatment. Pt agrees with plan.

## 2013-04-20 ENCOUNTER — Ambulatory Visit
Admission: RE | Admit: 2013-04-20 | Discharge: 2013-04-20 | Disposition: A | Discharge status: Home / Self Care | Payer: BC Managed Care – PPO | Source: Ambulatory Visit | Attending: Family Medicine | Admitting: Family Medicine

## 2013-04-20 DIAGNOSIS — M79605 Pain in left leg: Secondary | ICD-10-CM

## 2013-04-20 DIAGNOSIS — Z139 Encounter for screening, unspecified: Secondary | ICD-10-CM

## 2013-04-22 ENCOUNTER — Encounter: Payer: Self-pay | Admitting: Family Medicine

## 2013-04-24 ENCOUNTER — Other Ambulatory Visit: Payer: Self-pay | Admitting: Family Medicine

## 2013-04-24 DIAGNOSIS — M79605 Pain in left leg: Secondary | ICD-10-CM

## 2013-04-26 ENCOUNTER — Encounter: Payer: Self-pay | Admitting: Family Medicine

## 2013-04-26 ENCOUNTER — Ambulatory Visit (INDEPENDENT_AMBULATORY_CARE_PROVIDER_SITE_OTHER): Payer: BC Managed Care – PPO | Admitting: Family Medicine

## 2013-04-26 VITALS — BP 130/80 | HR 76 | Temp 98.1°F | Wt 195.0 lb

## 2013-04-26 DIAGNOSIS — M5417 Radiculopathy, lumbosacral region: Secondary | ICD-10-CM

## 2013-04-26 DIAGNOSIS — IMO0002 Reserved for concepts with insufficient information to code with codable children: Secondary | ICD-10-CM

## 2013-04-26 NOTE — Assessment & Plan Note (Signed)
Reviewed results with patient. Will keep appt with spine specialist on Monday. Continue gabapentin 300mg  bid for now.

## 2013-04-26 NOTE — Progress Notes (Signed)
  Subjective:    Patient ID: Carl Lee, male    DOB: 11-Dec-1955, 57 y.o.   MRN: [id number]  HPI CC: discuss MRI  30yr h/o L lumbosacral radiculopathy with pain/numbness. Recent MRI showing multilevel DDD and bulging disc mainly with L S1 nerve root impingement thought attributed to L leg pain. Pt presents today to discuss results of MRI.  Has set up appointment with Dr. Eduard Clos for Monday.  Known h/o lumbar DDD worst at L4/5 and L5/S1 by last CT scan.  Stent placed in AAA - zenith endovascular graft ([employer])  On gabapentin 300mg  bid.  Review of Systems Per HPI    Objective:   Physical Exam deferred    Assessment & Plan:

## 2013-05-03 ENCOUNTER — Encounter: Payer: Self-pay | Admitting: Family Medicine

## 2013-05-12 HISTORY — PX: OTHER SURGICAL HISTORY: SHX169

## 2013-05-15 ENCOUNTER — Encounter (INDEPENDENT_AMBULATORY_CARE_PROVIDER_SITE_OTHER): Payer: BC Managed Care – PPO

## 2013-05-15 DIAGNOSIS — I739 Peripheral vascular disease, unspecified: Secondary | ICD-10-CM

## 2013-06-28 ENCOUNTER — Encounter: Payer: Self-pay | Admitting: Family Medicine

## 2013-07-12 DIAGNOSIS — Z789 Other specified health status: Secondary | ICD-10-CM

## 2013-07-12 HISTORY — DX: Other specified health status: Z78.9

## 2013-07-19 ENCOUNTER — Encounter: Payer: Self-pay | Admitting: Family Medicine

## 2013-08-10 ENCOUNTER — Other Ambulatory Visit: Payer: Self-pay | Admitting: Surgery

## 2013-08-10 DIAGNOSIS — I6529 Occlusion and stenosis of unspecified carotid artery: Secondary | ICD-10-CM

## 2013-08-29 ENCOUNTER — Ambulatory Visit (INDEPENDENT_AMBULATORY_CARE_PROVIDER_SITE_OTHER): Payer: BC Managed Care – PPO | Admitting: Family Medicine

## 2013-08-29 ENCOUNTER — Encounter: Payer: Self-pay | Admitting: Family Medicine

## 2013-08-29 ENCOUNTER — Ambulatory Visit (INDEPENDENT_AMBULATORY_CARE_PROVIDER_SITE_OTHER)
Admission: RE | Admit: 2013-08-29 | Discharge: 2013-08-29 | Disposition: A | Discharge status: Home / Self Care | Payer: BC Managed Care – PPO | Source: Ambulatory Visit | Attending: Family Medicine | Admitting: Family Medicine

## 2013-08-29 VITALS — BP 116/68 | HR 96 | Temp 98.3°F

## 2013-08-29 DIAGNOSIS — S8262XA Displaced fracture of lateral malleolus of left fibula, initial encounter for closed fracture: Secondary | ICD-10-CM | POA: Insufficient documentation

## 2013-08-29 DIAGNOSIS — S82409A Unspecified fracture of shaft of unspecified fibula, initial encounter for closed fracture: Secondary | ICD-10-CM

## 2013-08-29 DIAGNOSIS — S8990XA Unspecified injury of unspecified lower leg, initial encounter: Secondary | ICD-10-CM

## 2013-08-29 DIAGNOSIS — S99929A Unspecified injury of unspecified foot, initial encounter: Secondary | ICD-10-CM

## 2013-08-29 DIAGNOSIS — S99919A Unspecified injury of unspecified ankle, initial encounter: Secondary | ICD-10-CM

## 2013-08-29 DIAGNOSIS — S82402A Unspecified fracture of shaft of left fibula, initial encounter for closed fracture: Secondary | ICD-10-CM

## 2013-08-29 DIAGNOSIS — S99912A Unspecified injury of left ankle, initial encounter: Secondary | ICD-10-CM

## 2013-08-29 HISTORY — DX: Displaced fracture of lateral malleolus of left fibula, initial encounter for closed fracture: S82.62XA

## 2013-08-29 MED ORDER — HYDROCODONE-ACETAMINOPHEN 10-325 MG PO TABS: 1.0000 | ORAL_TABLET | Freq: Three times a day (TID) | ORAL | Status: DC | PRN

## 2013-08-29 NOTE — Assessment & Plan Note (Addendum)
Concern for fibular fracture vs high ankle sprain - xray of L ankle performed today. Xray - distal nondisplaced L fibular fracture. Will refer to ortho today. Norco for pain. Pt agrees with plan, declines crutches today.

## 2013-08-29 NOTE — Progress Notes (Signed)
Pre-visit discussion using our clinic review tool. No additional management support is needed unless otherwise documented below in the visit note.  

## 2013-08-29 NOTE — Progress Notes (Signed)
   BP 116/68  Pulse 96  Temp(Src) 98.3 F (36.8 C) (Oral)   CC: L ankle injury  Subjective:    Patient ID: Carl Lee, male    DOB: Nov 28, 1955, 58 y.o.   MRN: [id number]  HPI: Carl Lee is a 58 y.o. male presenting on 08/29/2013 with Ankle Injury  2d ago outside walking down stairs took a misstep and had ankle inversion injury.  Immediate pain but no popping.  Unable to bear weight.  H/o sprained ankles in the past.  This is the worst it's been.    Did use ankle brace but has been unable to replace because of swelling.  Some bruising.  Minimal ice.  Taking 2 tylenol and 2 aspirin and 2 norco.  Requests referral to new urologist - will call back when closer to due date.  Relevant past medical, surgical, family and social history reviewed and updated. Allergies and medications reviewed and updated. Current Outpatient Prescriptions on File Prior to Visit  Medication Sig  . acetaminophen (TYLENOL) 500 MG tablet Take 500 mg by mouth every 6 (six) hours as needed for pain.  [name] aspirin 81 MG tablet Take 81 mg by mouth daily.  . Glucosamine-Chondroit-Vit C-Mn (GLUCOSAMINE 1500 COMPLEX PO) Take 2 tablets by mouth daily.  [name] lisinopril-hydrochlorothiazide (PRINZIDE,ZESTORETIC) 20-25 MG per tablet Take 1 tablet by mouth daily.  . Multiple Vitamins-Minerals (CENTRUM SILVER ADULT 50+ PO) Take 1 capsule by mouth daily.  [name] gabapentin (NEURONTIN) 300 MG capsule Take 1 capsule (300 mg total) by mouth 2 (two) times daily.  [name] triamcinolone cream (KENALOG) 0.1 % Apply topically 2 (two) times daily. Apply to AA.   No current facility-administered medications on file prior to visit.    Review of Systems Per HPI unless specifically indicated above    Objective:    BP 116/68  Pulse 96  Temp(Src) 98.3 F (36.8 C) (Oral)  Physical Exam  Vitals reviewed. Constitutional: He appears well-developed and well-nourished. No distress.  Wheeled in today in wheelchair, unable to bear weight on left foot    Musculoskeletal: He exhibits edema (marked edema to mid calf of left foot).  2+ DP bilaterally Intact to sensation. Tender to palpation at left posterior lateral malleolus as well as tender to squeeze test down leg, and point tenderness at distal left fibula.  Skin: Skin is warm and dry. Bruising (left lateral lower foot) noted. No rash noted.       Assessment & Plan:   Problem List Items Addressed This Visit   Left ankle injury - Primary     Concern for fibular fracture vs high ankle sprain - xray of L ankle performed today. Xray - distal nondisplaced L fibular fracture. Will refer to ortho today. Norco for pain.    Relevant Orders      DG Ankle Complete Left       Follow up plan: Return if symptoms worsen or fail to improve.

## 2013-08-29 NOTE — Patient Instructions (Signed)
You have distal fibula fracture of left foot. I'd like to get orthopedist involved. Pass by Marion's office to set this up.

## 2013-08-29 NOTE — Addendum Note (Signed)
Addended by: [name] on: 08/29/2013 01:44 PM   Modules accepted: Orders

## 2013-09-17 ENCOUNTER — Encounter: Payer: Self-pay | Admitting: Family Medicine

## 2013-10-26 ENCOUNTER — Telehealth: Payer: Self-pay | Admitting: Surgery

## 2013-10-26 NOTE — Telephone Encounter (Signed)
Carl Lee will keep the appointment for the carotid and the EVAR follow-up.

## 2013-10-26 NOTE — Telephone Encounter (Signed)
Message copied by Rufina Falco on Fri Oct 26, 2013  2:38 PM ------      Message from: Serafina Mitchell      Created: Fri Oct 26, 2013  2:16 PM      Regarding: RE: Patient questioning "need" for test       I would recommend it, however, I will leave that up to the patient.  It is pretty much standard of care.  With his deductable, it seams like with surgery, this will be met, so I would think now would be the time to do it.  Let me know what he wants to do      ----- Message -----         From: Rufina Falco         Sent: 10/26/2013  11:49 AM           To: Serafina Mitchell, MD      Subject: RE: Patient questioning "need" for test                  No difference.   Same deductible, same co-insurance.                  ----- Message -----         From: Serafina Mitchell, MD         Sent: 10/25/2013   7:26 AM           To: Gardiner Fanti Reaves      Subject: RE: Patient questioning "need" for test                  Would that be different if we did it while he was in the hospital?      ----- Message -----         From: Rufina Falco         Sent: 10/24/2013  10:31 AM           To: Serafina Mitchell, MD      Subject: Patient questioning "need" for test                      Mr. Bunch is scheduled for EVAR and Carotid duplex 05/04.  He is questioning the need for the carotid.  If he understood you correctly we were scanning the carotid due to his history of AAA.   He has a large deductible $5,000 so only wants the test if "necessary".            Please advise.            Rip Harbour                   ------

## 2013-11-09 ENCOUNTER — Encounter: Payer: Self-pay | Admitting: Surgery

## 2013-11-12 ENCOUNTER — Ambulatory Visit (HOSPITAL_COMMUNITY)
Admission: RE | Admit: 2013-11-12 | Discharge: 2013-11-12 | Disposition: A | Discharge status: Home / Self Care | Payer: BC Managed Care – PPO | Source: Ambulatory Visit | Attending: Surgery | Admitting: Surgery

## 2013-11-12 ENCOUNTER — Encounter: Payer: Self-pay | Admitting: Surgery

## 2013-11-12 ENCOUNTER — Ambulatory Visit (INDEPENDENT_AMBULATORY_CARE_PROVIDER_SITE_OTHER): Payer: BC Managed Care – PPO | Admitting: Surgery

## 2013-11-12 VITALS — BP 105/68 | HR 61 | Ht 71.0 in | Wt 198.0 lb

## 2013-11-12 DIAGNOSIS — I714 Abdominal aortic aneurysm, without rupture, unspecified: Secondary | ICD-10-CM

## 2013-11-12 DIAGNOSIS — I6529 Occlusion and stenosis of unspecified carotid artery: Secondary | ICD-10-CM | POA: Insufficient documentation

## 2013-11-12 DIAGNOSIS — M7989 Other specified soft tissue disorders: Secondary | ICD-10-CM | POA: Insufficient documentation

## 2013-11-12 DIAGNOSIS — M79609 Pain in unspecified limb: Secondary | ICD-10-CM

## 2013-11-12 DIAGNOSIS — I6509 Occlusion and stenosis of unspecified vertebral artery: Secondary | ICD-10-CM

## 2013-11-12 NOTE — Addendum Note (Signed)
Addended by: Mena Goes on: 11/12/2013 12:10 PM   Modules accepted: Orders

## 2013-11-12 NOTE — Progress Notes (Signed)
Patient name: Carl Lee MRN: [id number] DOB: 1955/09/08 Sex: male     Chief Complaint  Patient presents with  . Re-evaluation    1 year f/u EVAR and carotid duplex     HISTORY OF PRESENT ILLNESS: The patient is back today for followup. He is status post endovascular repair of a symptomatic abdominal aortic aneurysm. This was done using a [employer] device. Date of surgery was 03/25/2012. The patient initially presented with abdominal pain to his GI doctor. A CT scan showed a large abdominal aortic aneurysm. On my examination he had a very tender aorta and therefore I decided to take him to the operating room for repair. This repair was uncomplicated. He came back to the office several days later with complaints of bilateral flank pain and hypersensitivity in both upper thighs as well as numbness in his thighs.  He continues to have back pain.  He has had injections which have helped the pain in his left hip however he still has numbness.  He denies any more abdominal pain. He has been very fatigued lately.   Past Medical History  Diagnosis Date  . Bladder cancer 08/16/2008    [facility]ell now [place])  . Hypertension   . AAA (abdominal aortic aneurysm) 03/25/12    s/p endovascular repair  . Hx of migraines   . Contact dermatitis     atypical [name])  . Arthritis   . Childhood asthma   . Environmental allergies     improved as ages  . Diverticulosis 2013    severe by CT and colonoscopy  . Adenomatous polyp 06/2009  . Lumbosacral radiculopathy at S1 03/2012    left, with spinal stenosis (MRI 04/2013) improved with TF ESI L5/S1 and S1/2 [name])    Past Surgical History  Procedure Laterality Date  . Tonsillectomy and adenoidectomy  1973  . Vasectomy    . Bladder cancer      [facility] Spell)  . Abdominal aortic aneurysm repair  03/25/12  . Colonoscopy  06/2012    hyperplastic polyp, diverticulosis (jacobs) rec rpt 5 yrs  . Cystoscopy  08/16/08    Bladder Cancer  . Esi   04/2013, 06/2013    L L5S1, S12 transforaminal ESI (Dr.  [name])  . Abi  05/2013    WNL, L TBI low at 0.66    History   Social History  . Marital Status: Married    Spouse Name: N/A    Number of Children: 2  . Years of Education: N/A   Occupational History  . Self Employed    Social History Main Topics  . Smoking status: Current Every Day Smoker -- 0.50 packs/day for 30 years    Types: Cigarettes  . Smokeless tobacco: Never Used     Comment: pt states that he is trying to quit  . Alcohol Use: Yes     Comment: Rare  . Drug Use: No  . Sexual Activity: Not on file   Other Topics Concern  . Not on file   Social History Narrative   Caffeine: 1 cup coffee/day   Lives with wife, 1 dog   grown children   Occupation: Therapist, sports - self employed   Edu: 2 yrs college   Activity: fishing, walking occasionally   Diet: good water, fruits/vegetables daily    Family History  Problem Relation Age of Onset  . Diabetes Mother   . Arrhythmia Mother     pacemaker  . Cancer Mother  Bladder  . COPD Mother   . Thyroid disease Mother   . Hyperlipidemia Mother   . Hypertension Mother   . Diabetes Father   . CAD Father 63    CHF, MI  . Dementia Father   . Diabetes Sister   . Colon cancer Neg Hx   . Diabetes Brother   . Hypertension Brother     Allergies as of 11/12/2013 - Review Complete 11/12/2013  Allergen Reaction Noted  . Codeine Other (See Comments) 03/24/2012    Current Outpatient Prescriptions on File Prior to Visit  Medication Sig Dispense Refill  . acetaminophen (TYLENOL) 500 MG tablet Take 500 mg by mouth every 6 (six) hours as needed for pain.      [name] aspirin 81 MG tablet Take 81 mg by mouth daily.      . Glucosamine-Chondroit-Vit C-Mn (GLUCOSAMINE 1500 COMPLEX PO) Take 2 tablets by mouth daily.      [name] lisinopril-hydrochlorothiazide (PRINZIDE,ZESTORETIC) 20-25 MG per tablet Take 1 tablet by mouth daily.  90 tablet  3  . Multiple  Vitamins-Minerals (CENTRUM SILVER ADULT 50+ PO) Take 1 capsule by mouth daily.      [name] gabapentin (NEURONTIN) 300 MG capsule Take 1 capsule (300 mg total) by mouth 2 (two) times daily.  180 capsule  1  . HYDROcodone-acetaminophen (NORCO) 10-325 MG per tablet Take 1 tablet by mouth every 8 (eight) hours as needed.  20 tablet  0  . triamcinolone cream (KENALOG) 0.1 % Apply topically 2 (two) times daily. Apply to AA.  30 g  0   No current facility-administered medications on file prior to visit.     REVIEW OF SYSTEMS: Please see history of present illness, otherwise all systems negative  PHYSICAL EXAMINATION:   Vital signs are BP 105/68  Pulse 61  Ht 5\' 11"  (1.803 m)  Wt 198 lb (89.812 kg)  BMI 27.63 kg/m2  SpO2 99% General: The patient appears their stated age. HEENT:  No gross abnormalities Pulmonary:  Non labored breathing Abdomen: Soft and non-tender Musculoskeletal: There are no major deformities. Neurologic: No focal weakness or paresthesias are detected, Skin: There are no ulcer or rashes noted. Psychiatric: The patient has normal affect. Cardiovascular: There is a regular rate and rhythm without significant murmur appreciated.  No carotid bruit   Diagnostic Studies Carotid ultrasound: Less than 40% bilateral carotid stenosis  Abdominal ultrasound: Stable 4.5 cm aneurysm without evidence of endoleak  Assessment: Status post emergent endovascular aneurysm repair Plan: The patient is doing very well.  There is no evidence of a sac expansion on ultrasound.  He does not have an endoleak.  I will follow him with yearly surveillance.  Eldridge Abrahams, M.D. [facility] Office: [phone] Pager:  [phone]

## 2014-01-09 ENCOUNTER — Ambulatory Visit: Payer: Self-pay | Admitting: Urology

## 2014-01-09 LAB — BASIC METABOLIC PANEL
Anion Gap: 7 (ref 7–16)
BUN: 19 mg/dL — ABNORMAL HIGH (ref 7–18)
CALCIUM: 8.6 mg/dL (ref 8.5–10.1)
CO2: 27 mmol/L (ref 21–32)
Chloride: 104 mmol/L (ref 98–107)
Creatinine: 0.68 mg/dL (ref 0.60–1.30)
EGFR (African American): >60
EGFR (Non-African Amer.): >60
Glucose: 83 mg/dL (ref 65–99)
OSMOLALITY: 277 (ref 275–301)
POTASSIUM: 3.7 mmol/L (ref 3.5–5.1)
SODIUM: 138 mmol/L (ref 136–145)

## 2014-01-09 LAB — CBC
HCT: 46.1 % (ref 40.0–52.0)
HGB: 15.3 g/dL (ref 13.0–18.0)
MCH: 31.8 pg (ref 26.0–34.0)
MCHC: 33.2 g/dL (ref 32.0–36.0)
MCV: 96 fL (ref 80–100)
PLATELETS: 273 10*3/uL (ref 150–440)
RBC: 4.83 10*6/uL (ref 4.40–5.90)
RDW: 13.5 % (ref 11.5–14.5)
WBC: 10.9 10*3/uL — AB (ref 3.8–10.6)

## 2014-01-14 ENCOUNTER — Telehealth: Payer: Self-pay | Admitting: Family Medicine

## 2014-01-14 DIAGNOSIS — R9431 Abnormal electrocardiogram [ECG] [EKG]: Secondary | ICD-10-CM

## 2014-01-14 DIAGNOSIS — Z01818 Encounter for other preprocedural examination: Secondary | ICD-10-CM

## 2014-01-14 NOTE — Telephone Encounter (Signed)
Spoke with patient this morning to schedule a cardiology referral.  Pt stated that he did not want to pay $50.00 for a specialist co-pay when he did not need to see a cardiologist for previous surgery.  Spoke with Miquel Dunn at [facility], they do need pre-op surgical clearance and they will defer to what PCP states is needed.    Pt would also like to have additional information on diagnosis he received from [facility] visit, would like to speak with CMA or Dr. Danise Mina.  Best number to reach pt is [phone]

## 2014-01-14 NOTE — Telephone Encounter (Signed)
Placed on schedule.

## 2014-01-14 NOTE — Telephone Encounter (Signed)
Received request for clearance by urology for upcoming bladder tumor laser ablation. EKG done at urology - ?old septal MI. fmhx CAD, personal h/o AAA repair, HTN, mild dyslipidemia.  Will refer to cardiology for clearance. Order placed. plz notify Neva Seat at urology [phone]) and see if they want [place] to notify pt Placed request from urology in Kim's box.

## 2014-01-14 NOTE — Telephone Encounter (Signed)
Per Barbera Setters- she spoke with patient and he refused cardiology referral for clearance.

## 2014-01-14 NOTE — Telephone Encounter (Signed)
Spoke with patient. He will come in tomorrow for rpt EKG.  plz place on schedule at 2:45pm

## 2014-01-15 ENCOUNTER — Telehealth: Payer: Self-pay | Admitting: Family Medicine

## 2014-01-15 ENCOUNTER — Ambulatory Visit (INDEPENDENT_AMBULATORY_CARE_PROVIDER_SITE_OTHER): Payer: BC Managed Care – PPO | Admitting: Family Medicine

## 2014-01-15 ENCOUNTER — Encounter: Payer: Self-pay | Admitting: Family Medicine

## 2014-01-15 ENCOUNTER — Ambulatory Visit (INDEPENDENT_AMBULATORY_CARE_PROVIDER_SITE_OTHER)
Admission: RE | Admit: 2014-01-15 | Discharge: 2014-01-15 | Disposition: A | Discharge status: Home / Self Care | Payer: BC Managed Care – PPO | Source: Ambulatory Visit | Attending: Family Medicine | Admitting: Family Medicine

## 2014-01-15 VITALS — BP 122/68 | HR 87 | Temp 98.1°F | Wt 196.0 lb

## 2014-01-15 DIAGNOSIS — R9431 Abnormal electrocardiogram [ECG] [EKG]: Secondary | ICD-10-CM | POA: Insufficient documentation

## 2014-01-15 DIAGNOSIS — E785 Hyperlipidemia, unspecified: Secondary | ICD-10-CM

## 2014-01-15 DIAGNOSIS — F17200 Nicotine dependence, unspecified, uncomplicated: Secondary | ICD-10-CM

## 2014-01-15 DIAGNOSIS — Z01810 Encounter for preprocedural cardiovascular examination: Secondary | ICD-10-CM

## 2014-01-15 DIAGNOSIS — C679 Malignant neoplasm of bladder, unspecified: Secondary | ICD-10-CM

## 2014-01-15 NOTE — Telephone Encounter (Signed)
Relevant patient education assigned to patient using Emmi. ° °

## 2014-01-15 NOTE — Progress Notes (Signed)
Pre visit review using our clinic review tool, if applicable. No additional management support is needed unless otherwise documented below in the visit note. 

## 2014-01-15 NOTE — Assessment & Plan Note (Signed)
Will refer to cards for further evaluation of possible old septal MI in setting of upcoming bladder ablation surgery with GETA. Pt agrees with plan. Requests referral to Dr. Percival Spanish who helped take care of his mother.

## 2014-01-15 NOTE — Assessment & Plan Note (Signed)
Mild off meds. 

## 2014-01-15 NOTE — Assessment & Plan Note (Signed)
Had established with Dr. Elnoria Howard after Dr. Eliberto Ivory retired, does not want to have surgical procedure at [facility] , requests referral to establish with [facility] - placed today.

## 2014-01-15 NOTE — Patient Instructions (Addendum)
I'd like you to see [facility] for second opinion and to establish care I'd also like to refer you to Dr. Percival Spanish for preop evaluation prior to bladder surgery. Good to see you today, call us with quesitons. Check xray today in preparation for upcoming surgery.

## 2014-01-15 NOTE — Assessment & Plan Note (Signed)
Has restarted smoking - encouraged cessation. Coarse breath sounds - check CXR in prep for bladder surgery under GETA.

## 2014-01-15 NOTE — Assessment & Plan Note (Signed)
Low risk procedure - ablation for bladder cancer. Abnormal EKG, along with risk factors of smoker, mild HLD, HTN, h/o AAA, mild fmhx CAD. Check CXR in longtime smoker with coarse breath sounds - clear on my read, will await radiology eval. Will refer to cards for further evaluation/risk stratification. Pt agrees with plan.

## 2014-01-15 NOTE — Progress Notes (Signed)
BP 122/68  Pulse 87  Temp(Src) 98.1 F (36.7 C) (Oral)  Wt 196 lb (88.905 kg)  SpO2 97%   CC: preop eval  Subjective:    Patient ID: Carl Lee, male    DOB: 1955-11-07, 58 y.o.   MRN: [id number]  HPI: Carl Lee is a 58 y.o. male presenting on 01/15/2014 for Pre-op Exam   Known bladder cancer pending laser ablation next week. Dr. Eliberto Ivory retired now seeing Dr. Elnoria Howard. Not confortable with surgery at [facility] - [facility] for [facility] opinion and to establish care for bladder cancer.  Anesthesia preop with abnormal EKG - ?Q waves septally.  Presents today for rpt EKG and further evaluation. Initially declined cardiology eval. Has had surgeries in past under gen anesthesia and tolerated this well - except for prolonged fatigue after GETA.  H/o AAA repair and mild carotid stenosis (<40%) followed by [facility], current smoker, h/o mild HLD off meds, HTN (well controlled on lisinopril hctz), fmhx CAD. On aspirin 81mg  daily. Denies recent chest pain, tightness, dyspnea. States able to walk up a flight of stairs.  Stays congested with cough Wt Readings from Last 3 Encounters:  01/15/14 196 lb (88.905 kg)  11/12/13 198 lb (89.812 kg)  04/26/13 195 lb (88.451 kg)  Body mass index is 27.35 kg/(m^2).   Past Medical History  Diagnosis Date  . Bladder cancer 08/16/2008    [facility]nst Spell now [place])  . Hypertension   . AAA (abdominal aortic aneurysm) 03/25/12    s/p endovascular repair  . Hx of migraines   . Contact dermatitis     atypical [name])  . Arthritis   . Childhood asthma   . Environmental allergies     improved as ages  . Diverticulosis 2013    severe by CT and colonoscopy  . Adenomatous polyp 06/2009  . Lumbosacral radiculopathy at S1 03/2012    left, with spinal stenosis (MRI 04/2013) improved with TF ESI L5/S1 and S1/2 [name])    Past Surgical History  Procedure Laterality Date  . Tonsillectomy and adenoidectomy  1973  . Vasectomy    .  Bladder cancer      [facility]nst Spell)  . Abdominal aortic aneurysm repair  03/25/12  . Colonoscopy  06/2012    hyperplastic polyp, diverticulosis (jacobs) rec rpt 5 yrs  . Cystoscopy  08/16/08    Bladder Cancer  . Esi  04/2013, 06/2013    L L5S1, S12 transforaminal ESI (Dr.  [name])  . Abi  05/2013    WNL, L TBI low at 0.66   Family History  Problem Relation Age of Onset  . Diabetes Mother   . Arrhythmia Mother     pacemaker  . Cancer Mother     Bladder  . COPD Mother   . Thyroid disease Mother   . Hyperlipidemia Mother   . Hypertension Mother   . Diabetes Father   . CAD Father 11    CHF, MI  . Dementia Father   . Diabetes Sister   . Colon cancer Neg Hx   . Diabetes Brother   . Hypertension Brother     Relevant past medical, surgical, family and social history reviewed and updated as indicated.  Allergies and medications reviewed and updated. Current Outpatient Prescriptions on File Prior to Visit  Medication Sig  . acetaminophen (TYLENOL) 500 MG tablet Take 500 mg by mouth every 6 (six) hours as needed for pain.  [name] aspirin 81 MG tablet Take  81 mg by mouth daily.  . Glucosamine-Chondroit-Vit C-Mn (GLUCOSAMINE 1500 COMPLEX PO) Take 2 tablets by mouth daily.  [name] HYDROcodone-acetaminophen (NORCO) 10-325 MG per tablet Take 1 tablet by mouth every 8 (eight) hours as needed.  [name] lisinopril-hydrochlorothiazide (PRINZIDE,ZESTORETIC) 20-25 MG per tablet Take 1 tablet by mouth daily.  . Multiple Vitamins-Minerals (CENTRUM SILVER ADULT 50+ PO) Take 1 capsule by mouth daily.   No current facility-administered medications on file prior to visit.    Review of Systems Per HPI unless specifically indicated above    Objective:    BP 122/68  Pulse 87  Temp(Src) 98.1 F (36.7 C) (Oral)  Wt 196 lb (88.905 kg)  SpO2 97%  Physical Exam  Nursing note and vitals reviewed. Constitutional: He appears well-developed and well-nourished. No distress.  HENT:  Mouth/Throat: Oropharynx is  clear and moist. No oropharyngeal exudate.  Cardiovascular: Normal rate, regular rhythm, normal heart sounds and intact distal pulses.   No murmur heard. Pulmonary/Chest: Effort normal and breath sounds normal. No respiratory distress. He has no wheezes. He has no rales.  coarse  Musculoskeletal: He exhibits no edema.   Results for orders placed in visit on 01/02/13  LIPID PANEL      Result Value Ref Range   Cholesterol 186  0 - 200 mg/dL   Triglycerides 104.0  0.0 - 149.0 mg/dL   HDL 38.60 (*) >39.00 mg/dL   VLDL 20.8  0.0 - 40.0 mg/dL   LDL Cholesterol 127 (*) 0 - 99 mg/dL   Total CHOL/HDL Ratio 5        Assessment & Plan:   Problem List Items Addressed This Visit   Smoker     Has restarted smoking - encouraged cessation. Coarse breath sounds - check CXR in prep for bladder surgery under GETA.    Pre-operative cardiovascular examination - Primary     Low risk procedure - ablation for bladder cancer. Abnormal EKG, along with risk factors of smoker, mild HLD, HTN, h/o AAA, mild fmhx CAD. Check CXR in longtime smoker with coarse breath sounds - clear on my read, will await radiology eval. Will refer to cards for further evaluation/risk stratification. Pt agrees with plan.    Relevant Orders      EKG 12-Lead (Completed)      DG Chest 2 View   Dyslipidemia     Mild off meds.    Bladder cancer     Had established with Dr. Elnoria Howard after Dr. Eliberto Ivory retired, does not want to have surgical procedure at [facility] , requests referral to [facility] with [facility] - placed today.    Relevant Orders      Ambulatory referral to Urology   Abnormal EKG     Will refer to cards for further evaluation of possible old septal MI in setting of upcoming bladder ablation surgery with GETA. Pt agrees with plan. Requests referral to Dr. Percival Spanish who helped take care of his mother.    Relevant Orders      Ambulatory referral to Cardiology       Follow up plan: Return as needed.

## 2014-01-21 ENCOUNTER — Encounter: Payer: Self-pay | Admitting: Cardiovascular Disease

## 2014-01-21 ENCOUNTER — Ambulatory Visit (INDEPENDENT_AMBULATORY_CARE_PROVIDER_SITE_OTHER): Payer: BC Managed Care – PPO | Admitting: Cardiovascular Disease

## 2014-01-21 VITALS — BP 116/76 | HR 79 | Ht 71.0 in | Wt 197.8 lb

## 2014-01-21 DIAGNOSIS — I714 Abdominal aortic aneurysm, without rupture, unspecified: Secondary | ICD-10-CM

## 2014-01-21 DIAGNOSIS — I10 Essential (primary) hypertension: Secondary | ICD-10-CM

## 2014-01-21 DIAGNOSIS — E785 Hyperlipidemia, unspecified: Secondary | ICD-10-CM

## 2014-01-21 DIAGNOSIS — Z01818 Encounter for other preprocedural examination: Secondary | ICD-10-CM

## 2014-01-21 DIAGNOSIS — R9431 Abnormal electrocardiogram [ECG] [EKG]: Secondary | ICD-10-CM

## 2014-01-21 NOTE — Assessment & Plan Note (Signed)
Cholesterol is at goal.  Continue current dose of statin and diet Rx.  No myalgias or side effects.  F/U  LFT's in 6 months. Lab Results  Component Value Date   LDLCALC 127* 01/02/2013

## 2014-01-21 NOTE — Assessment & Plan Note (Signed)
Well controlled.  Continue current medications and low sodium Dash type diet.    

## 2014-01-21 NOTE — Progress Notes (Signed)
Patient ID: Carl Lee, male   DOB: Feb 06, 1956, 58 y.o.   MRN: [id number]  Carl Lee is a 58 y.o. Male referred by Dr Eulah Pont for preop clearance Known bladder cancer pending laser ablation next week. Anesthesia preop with abnormal EKG - ?Q waves septally.  Has had surgeries in past under gen anesthesia and tolerated this well - except for prolonged fatigue after GETA. H/o AAA repair and mild carotid stenosis (<40%) followed by [facility]VS, current smoker, h/o mild HLD off meds, HTN (well controlled on lisinopril hctz), fmhx CAD. On aspirin 81mg  daily. Denies recent chest pain, tightness, dyspnea. States able to walk up a flight of stairs  AAA repair was not open:  endovascular repair of a symptomatic abdominal aortic aneurysm. This was done using a [employer] device. Date of surgery was 03/25/2012.  Duplex 5/14 with residual aneurysm sac 4.4 cm followed by Dr [name] no endoleak.  No chest pain or claudication Still smoking  Never had a stress test     ROS: Denies fever, malais, weight loss, blurry vision, decreased visual acuity, cough, sputum, SOB, hemoptysis, pleuritic pain, palpitaitons, heartburn, abdominal pain, melena, lower extremity edema, claudication, or rash.  All other systems reviewed and negative   General: Affect appropriate Healthy:  appears stated age 53: normal Neck supple with no adenopathy JVP normal no bruits no thyromegaly Lungs clear with no wheezing and good diaphragmatic motion Heart:  S1/S2 no murmur,rub, gallop or click PMI normal Abdomen: benighn, BS positve, no tenderness, no AAA no bruit.  No HSM or HJR Distal pulses intact with no bruits No edema Neuro non-focal Skin warm and dry No muscular weakness  Medications Current Outpatient Prescriptions  Medication Sig Dispense Refill  . acetaminophen (TYLENOL) 500 MG tablet Take 500 mg by mouth every 6 (six) hours as needed for pain.      [name] aspirin 81 MG tablet Take 81 mg by mouth daily.      .  Glucosamine-Chondroit-Vit C-Mn (GLUCOSAMINE 1500 COMPLEX PO) Take 2 tablets by mouth daily.      [name] HYDROcodone-acetaminophen (NORCO) 10-325 MG per tablet Take 1 tablet by mouth every 8 (eight) hours as needed.  20 tablet  0  . lisinopril-hydrochlorothiazide (PRINZIDE,ZESTORETIC) 20-25 MG per tablet Take 1 tablet by mouth daily.  90 tablet  3  . Multiple Vitamins-Minerals (CENTRUM SILVER ADULT 50+ PO) Take 1 capsule by mouth daily.       No current facility-administered medications for this visit.    Allergies Codeine  Family History: Family History  Problem Relation Age of Onset  . Diabetes Mother   . Arrhythmia Mother     pacemaker  . Cancer Mother     Bladder  . COPD Mother   . Thyroid disease Mother   . Hyperlipidemia Mother   . Hypertension Mother   . Diabetes Father   . CAD Father 55    CHF, MI  . Dementia Father   . Diabetes Sister   . Colon cancer Neg Hx   . Diabetes Brother   . Hypertension Brother     Social History: History   Social History  . Marital Status: Married    Spouse Name: N/A    Number of Children: 2  . Years of Education: N/A   Occupational History  . Self Employed    Social History Main Topics  . Smoking status: Current Every Day Smoker -- 0.50 packs/day for 30 years    Types: Cigarettes  . Smokeless tobacco: Never Used  Comment: pt states that he is trying to quit  . Alcohol Use: Yes     Comment: Rare  . Drug Use: No  . Sexual Activity: Not on file   Other Topics Concern  . Not on file   Social History Narrative   Caffeine: 1 cup coffee/day   Lives with wife, 1 dog   grown children   Occupation: Therapist, sports - self employed   Edu: 2 yrs college   Activity: fishing, walking occasionally   Diet: good water, fruits/vegetables daily    Electrocardiogram: 7/15  SR rate 89  Qlead 2 and V12  ? Inferior perinfarct block and QRS/T wave dissociation in leads V12  Assessment and Plan

## 2014-01-21 NOTE — Assessment & Plan Note (Signed)
No palpable mass f/u duplex surveillance with Dr [name]

## 2014-01-21 NOTE — Patient Instructions (Addendum)
Your physician wants you to follow-up in:   YEAR WITH  DR [name] You will receive a reminder letter in the mail two months in advance. If you don't receive a letter, please call our office to schedule the follow-up appointment. Your physician recommends that you continue on your current medications as directed. Please refer to the Current Medication list given to you today.  Your physician has requested that you have en exercise stress myoview. For further information please visit [email or web address]. Please follow instruction sheet, as given.  

## 2014-01-21 NOTE — Assessment & Plan Note (Signed)
Pre-op  Smoker with known vascular disease in two areas carotid and AAA.  Abnormal ECG with possible old silent inferior posterior MI Needs stress myovue to clear for surgery  Continue ASA

## 2014-01-24 ENCOUNTER — Encounter: Payer: Self-pay | Admitting: Family Medicine

## 2014-01-28 ENCOUNTER — Ambulatory Visit (HOSPITAL_COMMUNITY): Payer: BC Managed Care – PPO | Attending: Cardiovascular Disease | Admitting: Radiology

## 2014-01-28 VITALS — BP 109/75 | HR 58 | Ht 71.0 in | Wt 194.0 lb

## 2014-01-28 DIAGNOSIS — R51 Headache: Secondary | ICD-10-CM

## 2014-01-28 DIAGNOSIS — Z01818 Encounter for other preprocedural examination: Secondary | ICD-10-CM

## 2014-01-28 DIAGNOSIS — I739 Peripheral vascular disease, unspecified: Secondary | ICD-10-CM | POA: Insufficient documentation

## 2014-01-28 DIAGNOSIS — Z8249 Family history of ischemic heart disease and other diseases of the circulatory system: Secondary | ICD-10-CM | POA: Insufficient documentation

## 2014-01-28 DIAGNOSIS — I10 Essential (primary) hypertension: Secondary | ICD-10-CM | POA: Insufficient documentation

## 2014-01-28 DIAGNOSIS — R9431 Abnormal electrocardiogram [ECG] [EKG]: Secondary | ICD-10-CM

## 2014-01-28 DIAGNOSIS — F17200 Nicotine dependence, unspecified, uncomplicated: Secondary | ICD-10-CM | POA: Insufficient documentation

## 2014-01-28 MED ORDER — REGADENOSON 0.4 MG/5ML IV SOLN
0.4000 mg | Freq: Once | INTRAVENOUS | Status: AC
  Administered 2014-01-28: 0.4 mg via INTRAVENOUS

## 2014-01-28 MED ORDER — AMINOPHYLLINE 25 MG/ML IV SOLN
75.0000 mg | Freq: Once | INTRAVENOUS | Status: AC
  Administered 2014-01-28: 75 mg via INTRAVENOUS

## 2014-01-28 MED ORDER — TECHNETIUM TC 99M SESTAMIBI GENERIC - CARDIOLITE
30.0000 | Freq: Once | INTRAVENOUS | Status: AC | PRN
  Administered 2014-01-28: 30 via INTRAVENOUS

## 2014-01-28 MED ORDER — TECHNETIUM TC 99M SESTAMIBI GENERIC - CARDIOLITE
10.0000 | Freq: Once | INTRAVENOUS | Status: AC | PRN
  Administered 2014-01-28: 10 via INTRAVENOUS

## 2014-01-28 NOTE — Progress Notes (Signed)
[facility] [address] [place], [place] [address] [phone]    Cardiology Nuclear Med Study  Carl Lee is a 58 y.o. male     MRN : [id number]     DOB: 09-Jan-1956  Procedure Date: 01/28/2014  Nuclear Med Background Indication for Stress Test:  Evaluation for Ischemia, Pending Surgical Clearance for Bladder Cancer with Dr. Rana Snare, and Abnormal KNL:[id number] Q waves History:  No prior known history of CAD Cardiac Risk Factors: Carotid Disease, Family History - CAD, Hypertension, Lipids, PVD and Smoker  Symptoms:  No symptoms   Nuclear Pre-Procedure Caffeine/Decaff Intake:  None NPO After: 7:00pm   Lungs:  Min. Expiratory Wheezing O2 Sat: 95% on room air. IV 0.9% NS with Angio Cath:  20g  IV Site: R Hand  IV Started by:  Matilde Haymaker, RN  Chest Size (in):  44 Cup Size: n/a  Height: 5\' 11"  (1.803 m)  Weight:  194 lb (87.998 kg)  BMI:  Body mass index is 27.07 kg/(m^2). Tech Comments:  n/a    Nuclear Med Study 1 or 2 day study: 1 day  Stress Test Type:  Lexiscan  Reading MD: n/a  Order Authorizing Provider:  Verlin Dike  Resting Radionuclide: Technetium 55m Sestamibi  Resting Radionuclide Dose: 11.0 mCi   Stress Radionuclide:  Technetium 50m Sestamibi  Stress Radionuclide Dose: 33.0 mCi           Stress Protocol Rest HR: 58 Stress HR: 95  Rest BP: 109/75 Stress BP: 124/84  Exercise Time (min): n/a METS: n/a   Predicted Max HR: 161 bpm % Max HR: 59.01 bpm Rate Pressure Product: 11780   Dose of Adenosine (mg):  n/a Dose of Lexiscan: 0.4 mg  Dose of Atropine (mg): n/a Dose of Dobutamine: n/a mcg/kg/min (at max HR)  Stress Test Technologist: Irven Baltimore, RN  Nuclear Technologist:  Vedia Pereyra, CNMT     Rest Procedure:  Myocardial perfusion imaging was performed at rest 45 minutes following the intravenous administration of Technetium 3m Sestamibi. Rest ECG: NSR with septal infarct  Stress Procedure:  The patient  received IV Lexiscan 0.4 mg over 15-seconds.  Technetium 16m Sestamibi injected at 30-seconds. The patient complained of SOB, Headache, but denied Chest Pain with Lexiscan. Quantitative spect images were obtained after a 45 minute delay. Aminophylline 75 mg IVP given post recovery at 0916 due to persistent headache with quick resolution of symptoms. Stress ECG: There are scattered PVCs.  QPS Raw Data Images: Diaphragmatic attenuation Stress Images:  large defect involving the entire infero and anteroseptum and mid and distal anterior wall and distal inferior wall and apex Rest Images:  large defect involving the entire infero and anteroseptum and mid and distal anterior wall and distal inferior wall and apex Subtraction (SDS):  There is a fixed defect that is most consistent with a previous infarction. Transient Ischemic Dilatation (Normal <1.22):  1.27 Lung/Heart Ratio (Normal <0.45):  0.39  Quantitative Gated Spect Images QGS EDV:  95 ml QGS ESV:  49 ml  Impression Exercise Capacity:  Lexiscan with no exercise. BP Response:  Normal blood pressure response. Clinical Symptoms:  There is dyspnea. ECG Impression:  No significant ST segment change suggestive of ischemia. Comparison with Prior Nuclear Study: No previous nuclear study performed  Overall Impression:  High risk stress nuclear study large fixed defect involving the entire inferior and anteroseptum and mid and distal anterior wall and distal inferior wall and apex with no ischemia.  LV  Ejection Fraction: 49%.  LV Wall Motion:  Mild LV dysfunction   Signed: Fransico Him,  MD [facility]

## 2014-01-29 ENCOUNTER — Telehealth: Payer: Self-pay | Admitting: *Deleted

## 2014-01-29 DIAGNOSIS — Z01818 Encounter for other preprocedural examination: Secondary | ICD-10-CM

## 2014-01-29 NOTE — Telephone Encounter (Signed)
lmtcb about nuclear results.  (Dr. [name] would like pt to have a cath Thurs or Friday for Abnormal myoview)  Cath lab has availability for Friday at 0730 & 10:30   I have not scheduled pt.  Will forward to [place]. I left message on pt mobile phone to call & ask for Dr. [name] nurse Altha Harm.  Horton Chin RN

## 2014-01-30 ENCOUNTER — Encounter (HOSPITAL_COMMUNITY): Payer: Self-pay | Admitting: Pharmacy Technician

## 2014-01-30 ENCOUNTER — Encounter: Payer: Self-pay | Admitting: *Deleted

## 2014-01-30 ENCOUNTER — Other Ambulatory Visit: Payer: Self-pay | Admitting: Cardiovascular Disease

## 2014-01-30 ENCOUNTER — Other Ambulatory Visit (INDEPENDENT_AMBULATORY_CARE_PROVIDER_SITE_OTHER): Payer: BC Managed Care – PPO

## 2014-01-30 ENCOUNTER — Telehealth: Payer: Self-pay | Admitting: Cardiovascular Disease

## 2014-01-30 DIAGNOSIS — I2584 Coronary atherosclerosis due to calcified coronary lesion: Principal | ICD-10-CM

## 2014-01-30 DIAGNOSIS — I2510 Atherosclerotic heart disease of native coronary artery without angina pectoris: Secondary | ICD-10-CM

## 2014-01-30 DIAGNOSIS — Z01818 Encounter for other preprocedural examination: Secondary | ICD-10-CM

## 2014-01-30 LAB — CBC WITH DIFFERENTIAL/PLATELET
Basophils Absolute: 0.1 10*3/uL (ref 0.0–0.1)
Basophils Relative: 0.6 % (ref 0.0–3.0)
EOS ABS: 0.4 10*3/uL (ref 0.0–0.7)
EOS PCT: 3.8 % (ref 0.0–5.0)
HEMATOCRIT: 45.8 % (ref 39.0–52.0)
HEMOGLOBIN: 15.5 g/dL (ref 13.0–17.0)
Lymphocytes Relative: 23 % (ref 12.0–46.0)
Lymphs Abs: 2.3 10*3/uL (ref 0.7–4.0)
MCHC: 33.8 g/dL (ref 30.0–36.0)
MCV: 95 fl (ref 78.0–100.0)
MONO ABS: 0.8 10*3/uL (ref 0.1–1.0)
Monocytes Relative: 8 % (ref 3.0–12.0)
NEUTROS ABS: 6.5 10*3/uL (ref 1.4–7.7)
Neutrophils Relative %: 64.6 % (ref 43.0–77.0)
Platelets: 309 10*3/uL (ref 150.0–400.0)
RBC: 4.82 Mil/uL (ref 4.22–5.81)
RDW: 13.3 % (ref 11.5–15.5)
WBC: 10.1 10*3/uL (ref 4.0–10.5)

## 2014-01-30 LAB — BASIC METABOLIC PANEL
BUN: 19 mg/dL (ref 6–23)
CO2: 29 mEq/L (ref 19–32)
CREATININE: 0.8 mg/dL (ref 0.4–1.5)
Calcium: 9.2 mg/dL (ref 8.4–10.5)
Chloride: 103 mEq/L (ref 96–112)
GFR: 100.5 mL/min (ref >60.00)
Glucose, Bld: 100 mg/dL — ABNORMAL HIGH (ref 70–99)
POTASSIUM: 3.5 meq/L (ref 3.5–5.1)
Sodium: 139 mEq/L (ref 135–145)

## 2014-01-30 LAB — PROTIME-INR
INR: 1 ratio (ref 0.8–1.0)
Prothrombin Time: 10.7 s (ref 9.6–13.1)

## 2014-01-30 NOTE — Telephone Encounter (Signed)
PT'S WIFE   NOTIFIED   THAT   PT  HAD  STENT  REPAIR  TO  AAA  WANTED  DR [name] TO  KNOW  INFORMED  WAS  MENTIONED  IN  LAST OFFICE  NOTE .Adonis Housekeeper

## 2014-01-30 NOTE — Telephone Encounter (Signed)
New message    patient calling stating does Dr. [name]- Dr. Glynda Jaeger  knows he has a stent. Schedule cath on  7/24 @ 5:30 am

## 2014-01-30 NOTE — Telephone Encounter (Signed)
LMTCB ./CY 

## 2014-01-30 NOTE — Telephone Encounter (Signed)
Pt  AWARE  CATH SCHEDULED  FOR   02-01-14 AT  7:30  WITH  DR MCALHANY./CY

## 2014-02-01 ENCOUNTER — Ambulatory Visit (HOSPITAL_COMMUNITY)
Admission: RE | Admit: 2014-02-01 | Discharge: 2014-02-01 | Disposition: A | Discharge status: Home / Self Care | Payer: BC Managed Care – PPO | Source: Ambulatory Visit | Attending: Cardiovascular Disease | Admitting: Cardiovascular Disease

## 2014-02-01 ENCOUNTER — Encounter (HOSPITAL_COMMUNITY)
Admission: RE | Disposition: A | Discharge status: Home / Self Care | Payer: Self-pay | Source: Ambulatory Visit | Attending: Cardiovascular Disease

## 2014-02-01 DIAGNOSIS — I2510 Atherosclerotic heart disease of native coronary artery without angina pectoris: Secondary | ICD-10-CM | POA: Insufficient documentation

## 2014-02-01 DIAGNOSIS — F1010 Alcohol abuse, uncomplicated: Secondary | ICD-10-CM | POA: Insufficient documentation

## 2014-02-01 DIAGNOSIS — Z7982 Long term (current) use of aspirin: Secondary | ICD-10-CM | POA: Insufficient documentation

## 2014-02-01 DIAGNOSIS — I2582 Chronic total occlusion of coronary artery: Secondary | ICD-10-CM | POA: Insufficient documentation

## 2014-02-01 DIAGNOSIS — Z9889 Other specified postprocedural states: Secondary | ICD-10-CM | POA: Insufficient documentation

## 2014-02-01 DIAGNOSIS — Z8249 Family history of ischemic heart disease and other diseases of the circulatory system: Secondary | ICD-10-CM | POA: Insufficient documentation

## 2014-02-01 DIAGNOSIS — I10 Essential (primary) hypertension: Secondary | ICD-10-CM | POA: Insufficient documentation

## 2014-02-01 DIAGNOSIS — F17200 Nicotine dependence, unspecified, uncomplicated: Secondary | ICD-10-CM | POA: Insufficient documentation

## 2014-02-01 DIAGNOSIS — Z8052 Family history of malignant neoplasm of bladder: Secondary | ICD-10-CM | POA: Insufficient documentation

## 2014-02-01 DIAGNOSIS — I2584 Coronary atherosclerosis due to calcified coronary lesion: Secondary | ICD-10-CM

## 2014-02-01 DIAGNOSIS — I6529 Occlusion and stenosis of unspecified carotid artery: Secondary | ICD-10-CM | POA: Insufficient documentation

## 2014-02-01 DIAGNOSIS — C679 Malignant neoplasm of bladder, unspecified: Secondary | ICD-10-CM | POA: Insufficient documentation

## 2014-02-01 DIAGNOSIS — E785 Hyperlipidemia, unspecified: Secondary | ICD-10-CM | POA: Insufficient documentation

## 2014-02-01 HISTORY — PX: LEFT HEART CATHETERIZATION WITH CORONARY ANGIOGRAM: SHX5451

## 2014-02-01 SURGERY — LEFT HEART CATHETERIZATION WITH CORONARY ANGIOGRAM
Anesthesia: LOCAL

## 2014-02-01 MED ORDER — HEPARIN (PORCINE) IN NACL 2-0.9 UNIT/ML-% IJ SOLN
INTRAMUSCULAR | Status: AC
  Filled 2014-02-01: qty 1000

## 2014-02-01 MED ORDER — SODIUM CHLORIDE 0.9 % IJ SOLN: 3.0000 mL | Freq: Two times a day (BID) | INTRAMUSCULAR | Status: DC

## 2014-02-01 MED ORDER — NITROGLYCERIN 1 MG/10 ML FOR IR/CATH LAB
INTRA_ARTERIAL | Status: AC
  Filled 2014-02-01: qty 10

## 2014-02-01 MED ORDER — ASPIRIN 81 MG PO CHEW
CHEWABLE_TABLET | ORAL | Status: AC
  Filled 2014-02-01: qty 1

## 2014-02-01 MED ORDER — HEPARIN SODIUM (PORCINE) 1000 UNIT/ML IJ SOLN
INTRAMUSCULAR | Status: AC
Start: 2014-02-01 — End: 2014-02-01
  Filled 2014-02-01: qty 1

## 2014-02-01 MED ORDER — LIDOCAINE HCL (PF) 1 % IJ SOLN
INTRAMUSCULAR | Status: AC
  Filled 2014-02-01: qty 30

## 2014-02-01 MED ORDER — FENTANYL CITRATE 0.05 MG/ML IJ SOLN
INTRAMUSCULAR | Status: AC
  Filled 2014-02-01: qty 2

## 2014-02-01 MED ORDER — MIDAZOLAM HCL 2 MG/2ML IJ SOLN
INTRAMUSCULAR | Status: AC
  Filled 2014-02-01: qty 2

## 2014-02-01 MED ORDER — SODIUM CHLORIDE 0.9 % IJ SOLN: 3.0000 mL | INTRAMUSCULAR | Status: DC | PRN

## 2014-02-01 MED ORDER — SODIUM CHLORIDE 0.9 % IV SOLN: INTRAVENOUS | Status: AC

## 2014-02-01 MED ORDER — VERAPAMIL HCL 2.5 MG/ML IV SOLN
INTRAVENOUS | Status: AC
  Filled 2014-02-01: qty 2

## 2014-02-01 MED ORDER — SODIUM CHLORIDE 0.9 % IV SOLN
250.0000 mL | INTRAVENOUS | Status: DC | PRN
  Administered 2014-02-01: 1000 mL via INTRAVENOUS

## 2014-02-01 MED ORDER — ASPIRIN 81 MG PO CHEW
81.0000 mg | CHEWABLE_TABLET | ORAL | Status: AC
  Administered 2014-02-01: 81 mg via ORAL

## 2014-02-01 NOTE — Discharge Instructions (Signed)
He will resume home meds. He will be contacted by Dr. Kyla Balzarine team to arrange appt with CT surgery and f/u with Dr. [name]. cdm  Radial Site Care  Refer to this sheet in the next few weeks. These instructions provide you with information on caring for yourself after your procedure. Your caregiver may also give you more specific instructions. Your treatment has been planned according to current medical practices, but problems sometimes occur. Call your caregiver if you have any problems or questions after your procedure. HOME CARE INSTRUCTIONS  You may shower the day after the procedure.Remove the bandage (dressing) and gently wash the site with plain soap and water.Gently pat the site dry.  Do not apply powder or lotion to the site.  Do not submerge the affected site in water for 3 to 5 days.  Inspect the site at least twice daily.  Do not flex or bend the affected arm for 24 hours.  No lifting over 5 pounds (2.3 kg) for 5 days after your procedure.  Do not drive home if you are discharged the same day of the procedure. Have someone else drive you.  You may drive 24 hours after the procedure unless otherwise instructed by your caregiver.  Do not operate machinery or power tools for 24 hours.  A responsible adult should be with you for the first 24 hours after you arrive home. What to expect:  Any bruising will usually fade within 1 to 2 weeks.  Blood that collects in the tissue (hematoma) may be painful to the touch. It should usually decrease in size and tenderness within 1 to 2 weeks. SEEK IMMEDIATE MEDICAL CARE IF:  You have unusual pain at the radial site.  You have redness, warmth, swelling, or pain at the radial site.  You have drainage (other than a small amount of blood on the dressing).  You have chills.  You have a fever or persistent symptoms for more than 72 hours.  You have a fever and your symptoms suddenly get worse.  Your arm becomes pale, cool, tingly,  or numb.  You have heavy bleeding from the site. Hold pressure on the site. Document Released: 07/31/2010 Document Revised: 09/20/2011 Document Reviewed: 07/31/2010 [facility] Patient Information 2015 [place], [place]. This information is not intended to replace advice given to you by your health care provider. Make sure you discuss any questions you have with your health care provider.

## 2014-02-01 NOTE — CV Procedure (Signed)
      Cardiac Catheterization Operative Report  Carl Lee [id number] 7/24/20157:42 AM [name], MD  Procedure Performed:  1. Left Heart Catheterization 2. Selective Coronary Angiography 3. Left ventricular angiogram  Operator: Lauree Chandler, MD  Arterial access site:  Right radial artery.   Indication: 58 yo male with history of AAA, tobacco abuse, HTN with bladder cancer recurrence and plans for laser ablation in the office. EKG and stress myoview abnormal. Cardiac cath to exclude CAD.                                       Procedure Details: The risks, benefits, complications, treatment options, and expected outcomes were discussed with the patient. The patient and/or family concurred with the proposed plan, giving informed consent. The patient was brought to the cath lab after IV hydration was begun and oral premedication was given. The patient was further sedated with Versed and fentanyl. The right wrist was assessed with an Allens test which was positive. The right wrist was prepped and draped in a sterile fashion. 1% lidocaine was used for local anesthesia. Using the modified Seldinger access technique, a 5 French sheath was placed in the right radial artery. 3 mg Verapamil was given through the sheath. 4500 units IV heparin was given. Standard diagnostic catheters were used to perform selective coronary angiography. A pigtail catheter was used to perform a left ventricular angiogram. The sheath was removed from the right radial artery and a Terumo hemostasis band was applied at the arteriotomy site on the right wrist.   There were no immediate complications. The patient was taken to the recovery area in stable condition.   Hemodynamic Findings: Central aortic pressure: 113/67 Left ventricular pressure: 120/8/16  Angiographic Findings:  Left main: No obstructive disease.   Left Anterior Descending Artery: Large caliber vessel that courses to the apex. The  proximal vessel has diffuse 30% stenosis followed by 100% occlusion after a large septal perforating branch. The mid and distal vessel fills from left to left collaterals.   Circumflex Artery: Large co-dominant vessel with three moderate caliber obtuse marginal branches. The proximal vessel has a focal 80% stenosis followed by an aneurysmal segment. The mid vessel has a 50% stenosis just before the takeoff of the first obtuse marginal branch. All three of the marginal branches are patent with minimal plaque disease.   Right Coronary Artery: Small to moderate caliber co-dominant vessel with 100% proximal occlusion. There is a moderate caliber RV marginal branch that arises proximal to the occlusion of the proximal vessel. There appears to be filling of the small caliber distal RCA from right to right and left to right collaterals.   Left Ventricular Angiogram: LVEF=50% with subtle hypokinesis of the anterior wall with abnormal appearance at the LV apex, possible LV apical aneurysm.   Impression: 1. Triple vessel CAD with chronic total occlusion of the LAD and RCA, high grade disease in the proximal Circumflex.  2. Low normal LV systolic function  Recommendations: He is having no active chest pain or signs of unstable angina. Would proceed with the office based laser bladder procedure. I have discussed with Dr. [name]. Will refer to see CT surgery as an outpatient for CABG.        Complications:  None. The patient tolerated the procedure well.

## 2014-02-01 NOTE — H&P (View-Only) (Signed)
Patient ID: Carl Lee, male   DOB: 28-Feb-1956, 58 y.o.   MRN: [id number]  EDU ON is a 58 y.o. Male referred by Dr Eulah Pont for preop clearance Known bladder cancer pending laser ablation next week. Anesthesia preop with abnormal EKG - ?Q waves septally.  Has had surgeries in past under gen anesthesia and tolerated this well - except for prolonged fatigue after GETA. H/o AAA repair and mild carotid stenosis (<40%) followed by [facility], current smoker, h/o mild HLD off meds, HTN (well controlled on lisinopril hctz), fmhx CAD. On aspirin 81mg  daily. Denies recent chest pain, tightness, dyspnea. States able to walk up a flight of stairs  AAA repair was not open:  endovascular repair of a symptomatic abdominal aortic aneurysm. This was done using a [employer] device. Date of surgery was 03/25/2012.  Duplex 5/14 with residual aneurysm sac 4.4 cm followed by Dr [name] no endoleak.  No chest pain or claudication Still smoking  Never had a stress test     ROS: Denies fever, malais, weight loss, blurry vision, decreased visual acuity, cough, sputum, SOB, hemoptysis, pleuritic pain, palpitaitons, heartburn, abdominal pain, melena, lower extremity edema, claudication, or rash.  All other systems reviewed and negative   General: Affect appropriate Healthy:  appears stated age 9: normal Neck supple with no adenopathy JVP normal no bruits no thyromegaly Lungs clear with no wheezing and good diaphragmatic motion Heart:  S1/S2 no murmur,rub, gallop or click PMI normal Abdomen: benighn, BS positve, no tenderness, no AAA no bruit.  No HSM or HJR Distal pulses intact with no bruits No edema Neuro non-focal Skin warm and dry No muscular weakness  Medications Current Outpatient Prescriptions  Medication Sig Dispense Refill  . acetaminophen (TYLENOL) 500 MG tablet Take 500 mg by mouth every 6 (six) hours as needed for pain.      [name] aspirin 81 MG tablet Take 81 mg by mouth daily.      .  Glucosamine-Chondroit-Vit C-Mn (GLUCOSAMINE 1500 COMPLEX PO) Take 2 tablets by mouth daily.      [name] HYDROcodone-acetaminophen (NORCO) 10-325 MG per tablet Take 1 tablet by mouth every 8 (eight) hours as needed.  20 tablet  0  . lisinopril-hydrochlorothiazide (PRINZIDE,ZESTORETIC) 20-25 MG per tablet Take 1 tablet by mouth daily.  90 tablet  3  . Multiple Vitamins-Minerals (CENTRUM SILVER ADULT 50+ PO) Take 1 capsule by mouth daily.       No current facility-administered medications for this visit.    Allergies Codeine  Family History: Family History  Problem Relation Age of Onset  . Diabetes Mother   . Arrhythmia Mother     pacemaker  . Cancer Mother     Bladder  . COPD Mother   . Thyroid disease Mother   . Hyperlipidemia Mother   . Hypertension Mother   . Diabetes Father   . CAD Father 46    CHF, MI  . Dementia Father   . Diabetes Sister   . Colon cancer Neg Hx   . Diabetes Brother   . Hypertension Brother     Social History: History   Social History  . Marital Status: Married    Spouse Name: N/A    Number of Children: 2  . Years of Education: N/A   Occupational History  . Self Employed    Social History Main Topics  . Smoking status: Current Every Day Smoker -- 0.50 packs/day for 30 years    Types: Cigarettes  . Smokeless tobacco: Never Used  Comment: pt states that he is trying to quit  . Alcohol Use: Yes     Comment: Rare  . Drug Use: No  . Sexual Activity: Not on file   Other Topics Concern  . Not on file   Social History Narrative   Caffeine: 1 cup coffee/day   Lives with wife, 1 dog   grown children   Occupation: Therapist, sports - self employed   Edu: 2 yrs college   Activity: fishing, walking occasionally   Diet: good water, fruits/vegetables daily    Electrocardiogram: 7/15  SR rate 89  Qlead 2 and V12  ? Inferior perinfarct block and QRS/T wave dissociation in leads V12  Assessment and Plan

## 2014-02-01 NOTE — Interval H&P Note (Signed)
History and Physical Interval Note:  02/01/2014 7:33 AM  Carl Lee  has presented today for cardiac cath with the diagnosis of abnormal stress myoview, EKG. The various methods of treatment have been discussed with the patient and family. After consideration of risks, benefits and other options for treatment, the patient has consented to  Procedure(s): LEFT HEART CATHETERIZATION WITH CORONARY ANGIOGRAM (N/A) as a surgical intervention .  The patient's history has been reviewed, patient examined, no change in status, stable for surgery.  I have reviewed the patient's chart and labs.  Questions were answered to the patient's satisfaction.    Cath Lab Visit (complete for each Cath Lab visit)  Clinical Evaluation Leading to the Procedure:   ACS: No.  Non-ACS:    Anginal Classification: No Symptoms  Anti-ischemic medical therapy: No Therapy  Non-Invasive Test Results: High-risk stress test findings: cardiac mortality >3%/year  Prior CABG: No previous CABG         Redell Bhandari

## 2014-02-04 ENCOUNTER — Telehealth: Payer: Self-pay | Admitting: Cardiovascular Disease

## 2014-02-04 ENCOUNTER — Telehealth: Payer: Self-pay

## 2014-02-04 DIAGNOSIS — I2510 Atherosclerotic heart disease of native coronary artery without angina pectoris: Secondary | ICD-10-CM

## 2014-02-04 MED ORDER — LORAZEPAM 0.5 MG PO TABS: 0.5000 mg | ORAL_TABLET | Freq: Two times a day (BID) | ORAL | Status: DC | PRN

## 2014-02-04 NOTE — Telephone Encounter (Signed)
Pt left v/m; pt saw cardiologist and pt had heart cath; pt has 2 arteries 100 % blocked and 1 artery is 80 % blocked. Pt said Dr [name] is out of office on vacation and pt said Dr [name] office is not sure what is next thing for pt to do. Pt was advised from cardiology office that pt is at very high risk and not to do anything; pt is very worried and pt said anxiety is going to give pt a heart attack. Pt wants to know what Dr Danise Mina would advise. Pt wants to know if he should see another cardiologist or what to do. Pt request cb. Pt said he has cancer of the bladder and does not know if he can proceed with surgery with these new findings of a heart condition.

## 2014-02-04 NOTE — Telephone Encounter (Signed)
Follow up  ° ° ° °Returning call back to nurse  °

## 2014-02-04 NOTE — Telephone Encounter (Signed)
New message      Pt had a heart cath and they found blockage.  He said he will need to have surgery.  He is very anxious.  Can someone call him today and let him know if it is ok for him to "do stuff" or is it so serious that he should limit activities until he sees the surgeon.

## 2014-02-04 NOTE — Telephone Encounter (Signed)
Thank you, Dr. Angelena Form!  Dr. Cyndia Bent is on vacation all week. Dr. [name] is available this week.

## 2014-02-04 NOTE — Telephone Encounter (Signed)
Patient called with question regarding his activity status, in light of his Cath results from Friday July 24th.  Patient states other than mild SOB at times, he feels "well enough" to get a few things done around the house and yard. He is wondering if that is okay, although he is experiencing increased anxiety due to the information he received on Friday regarding his blockages and need for CABG. Dr. Mare Ferrari reviewed the Cath report and patient's overall history. Dr. Mare Ferrari advises that the patient does NOT do any significant activity at this time until he is contacted by his heart surgeon and gets an activity clearance from him, as his triple vessel disease is high risk and high grade with two complete occlusions.   Called patient back to inform him of Dr. Sherryl Barters advisement. Patient states he is frustrated because he knows that Dr. [name] is out of office on vacation through sometime next week. He does not want to wait to know who his CT surgeon will be because he wants an appointment and activity status update as soon as possible. Patient states that Dr. Angelena Form advised him that someone would call him Monday (today), July 27th, and he has not heard from anyone today. No referral noted in chart at this time. Routed to Dr. Angelena Form and Dr. [name].

## 2014-02-04 NOTE — Telephone Encounter (Signed)
Called patient to provide update that referral to CT surgeon remains pending. Patient states he is having anxiety - "not sure what it is but think it is just anxiety". Chest mild-to-moderate tight. Denies other cardiac symptoms at this time. Notified Dr. Angelena Form regarding need for CT Surgery referral. Dr. Angelena Form ordered CT Surgery referral for [facility] - Dr. Cyndia Bent, 1st choice. Called regarding referral. Dr. Cyndia Bent on vacation this week. Dr. Blase Mess can see patient this week. Office will call back on 02/05/14 with day/time for appointment this week with Dr. [name].   Called patient to relay information and news that Dr. Leonarda Salon office will set up appointment for this week to schedule surgery. He will be notified 02/05/14 with day/time. Patient verbalized appreciation and gratitude for arranging follow up today. He will await call in the morning for further news. Discussed his concerns and questions. Advised should he experience any worsening symptoms or acute CP to call 911 and seek emergent care from Emergency Department. Education provided related to s/s of myocardial infarction, such as SOB, CP, dizziness, jaw/back pain, etc.  Patient verbalized understanding and agreement with current treatment plan.

## 2014-02-04 NOTE — Telephone Encounter (Signed)
Referral can be made to Dr. Cyndia Bent. cdm

## 2014-02-04 NOTE — Telephone Encounter (Signed)
Spoke with patient, discussed concerns. Support provided. Pt will hopefully have appt this week with CT surgeon. Significant anxiety - will call in ativan 0.5 mg prn, discussed sedation precautions, discussed temporary course

## 2014-02-05 ENCOUNTER — Encounter: Payer: Self-pay | Admitting: Thoracic Surgery (Cardiothoracic Vascular Surgery)

## 2014-02-05 ENCOUNTER — Other Ambulatory Visit: Payer: Self-pay | Admitting: *Deleted

## 2014-02-05 ENCOUNTER — Institutional Professional Consult (permissible substitution) (INDEPENDENT_AMBULATORY_CARE_PROVIDER_SITE_OTHER): Payer: BC Managed Care – PPO | Admitting: Thoracic Surgery (Cardiothoracic Vascular Surgery)

## 2014-02-05 VITALS — BP 115/78 | HR 92 | Resp 20 | Ht 71.0 in | Wt 195.0 lb

## 2014-02-05 DIAGNOSIS — I2510 Atherosclerotic heart disease of native coronary artery without angina pectoris: Secondary | ICD-10-CM

## 2014-02-05 DIAGNOSIS — I25111 Atherosclerotic heart disease of native coronary artery with angina pectoris with documented spasm: Secondary | ICD-10-CM

## 2014-02-05 NOTE — Pre-Procedure Instructions (Signed)
Carl Lee  02/05/2014   Your procedure is scheduled on:  Thurs, July 30 @ 8:00 AM  Report to [facility]  at 5:30 AM.  Call this number if you have problems the morning of surgery: [phone]   Remember:   Do not eat food or drink liquids after midnight.   Take these medicines the morning of surgery with A SIP OF WATER: Ativan(Lorazepam-if needed)             No Goody's,BC's,Aleve,Ibuprofen,Fish Oil,or any Herbal Medications   Do not wear jewelry  Do not wear lotions, powders, or colognes.   Men may shave face and neck.  Do not bring valuables to the hospital.  [facility] is not responsible                  for any belongings or valuables.               Contacts, dentures or bridgework may not be worn into surgery.  Leave suitcase in the car. After surgery it may be brought to your room.  For patients admitted to the hospital, discharge time is determined by your                treatment team.                Special Instructions:  [place] - Preparing for Surgery  Before surgery, you can play an important role.  Because skin is not sterile, your skin needs to be as free of germs as possible.  You can reduce the number of germs on you skin by washing with CHG (chlorahexidine gluconate) soap before surgery.  CHG is an antiseptic cleaner which kills germs and bonds with the skin to continue killing germs even after washing.  Please DO NOT use if you have an allergy to CHG or antibacterial soaps.  If your skin becomes reddened/irritated stop using the CHG and inform your nurse when you arrive at Short Stay.  Do not shave (including legs and underarms) for at least 48 hours prior to the first CHG shower.  You may shave your face.  Please follow these instructions carefully:   1.  Shower with CHG Soap the night before surgery and the                                morning of Surgery.  2.  If you choose to wash your hair, wash your hair first as usual with your        normal shampoo.  3.  After you shampoo, rinse your hair and body thoroughly to remove the                      Shampoo.  4.  Use CHG as you would any other liquid soap.  You can apply chg directly       to the skin and wash gently with scrungie or a clean washcloth.  5.  Apply the CHG Soap to your body ONLY FROM THE NECK DOWN.        Do not use on open wounds or open sores.  Avoid contact with your eyes,       ears, mouth and genitals (private parts).  Wash genitals (private parts)       with your normal soap.  6.  Wash thoroughly, paying special attention to the area where your  surgery        will be performed.  7.  Thoroughly rinse your body with warm water from the neck down.  8.  DO NOT shower/wash with your normal soap after using and rinsing off       the CHG Soap.  9.  Pat yourself dry with a clean towel.            10.  Wear clean pajamas.            11.  Place clean sheets on your bed the night of your first shower and do not        sleep with pets.  Day of Surgery  Do not apply any lotions/deoderants the morning of surgery.  Please wear clean clothes to the hospital/surgery center.     Please read over the following fact sheets that you were given: Pain Booklet, Coughing and Deep Breathing, Blood Transfusion Information, MRSA Information and Surgical Site Infection Prevention

## 2014-02-05 NOTE — Telephone Encounter (Signed)
Rec'd call from [facility] at Dr. Leonarda Salon office (CT surgery). Patient to be seen today at 12:30 pm. Patient has been notified.  Called patient to assess if there is anything further he needs assistance with at this time. Patient confirmed appointment today at 12:30 pm. Patient verbalized appreciation for ensuring he was referred yesterday so that surgery could be expedited. Patient will call us back for any concerns or further needs.

## 2014-02-05 NOTE — Progress Notes (Signed)
PCP is [name], MD Referring Provider is Josue Hector, MD  Chief Complaint  Patient presents with  . Coronary Artery Disease    Surgical eval, Cardiac Cath 02/01/14    HPI: Mr. Wohler is a 58 year old gentleman sent for consultation regarding severe three-vessel coronary disease.  Mr. Whidbee is a 58 year old gentleman with a history of hyperlipidemia, hypercholesterolemia, tobacco abuse and a positive family history for coronary disease. He also has a history of bladder cancer. He had a transurethral resection in 2010. He recently was diagnosed with a recurrence. During his preoperative evaluation he was noted to have an abnormal EKG. A repeat EKG showed the same Q waves in V1 and 2. He was referred for cardiology evaluation. Given his history of abdominal aortic aneurysm and carotid disease and multiple cardiac risk factors a stress Myoview was done. The Myoview showed a large fixed defect involving the entire inferior and anteroseptum and mid and distal anterior wall and distal inferior wall and apex with no ischemia. He was referred for cardiac catheterization.  Dr. Angelena Form performed cardiac catheterization on 02/01/2014. That revealed total occlusion of his LAD and right coronary and an 80% stenosis and a circumflex giving rise to 3 large obtuse marginal branches. Ejection fraction was 45-50%.  Mr. Quant denies any chest pain, pressure or tightness with exertion. He has been experiencing some pain in his right jaw over the past couple of months. This is not consistently exertional and is unclear if this represents an anginal equivalent. 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    Past Medical History  Diagnosis Date  . Bladder cancer 08/16/2008    [facility] then [place] now [place])  . Hypertension   . AAA (abdominal aortic aneurysm) 03/25/12    s/p endovascular repair  . Hx of  migraines   . Contact dermatitis     atypical [name])  . Arthritis   . Childhood asthma   . Environmental allergies     improved as ages  . Diverticulosis 2013    severe by CT and colonoscopy  . Adenomatous polyp 06/2009  . Lumbosacral radiculopathy at S1 03/2012    left, with spinal stenosis (MRI 04/2013) improved with TF ESI L5/S1 and S1/2 [name])    Past Surgical History  Procedure Laterality Date  . Tonsillectomy and adenoidectomy  1973  . Vasectomy    . Bladder cancer      [facility])  . Abdominal aortic aneurysm repair  03/25/12  . Colonoscopy  06/2012    hyperplastic polyp, diverticulosis (jacobs) rec rpt 5 yrs  . Cystoscopy  08/16/08    Bladder Cancer  . Esi  04/2013, 06/2013    L L5S1, S12 transforaminal ESI (Dr.  [name])  . Abi  05/2013    WNL, L TBI low at 0.66    Family History  Problem Relation Age of Onset  . Diabetes Mother   . Arrhythmia Mother     pacemaker  . Cancer Mother     Bladder  . COPD Mother   . Thyroid disease Mother   . Hyperlipidemia Mother   . Hypertension Mother   . Diabetes Father   . CAD Father 53    CHF, MI  . Dementia Father   . Diabetes Sister   . Colon cancer Neg Hx   . Diabetes Brother   . Hypertension Brother  Social History History  Substance Use Topics  . Smoking status: Current Every Day Smoker -- 0.50 packs/day for 30 years    Types: Cigarettes  . Smokeless tobacco: Never Used     Comment: pt states that he is trying to quit  . Alcohol Use: Yes     Comment: Rare    Current Outpatient Prescriptions  Medication Sig Dispense Refill  . acetaminophen (TYLENOL) 500 MG tablet Take 500 mg by mouth every 6 (six) hours as needed for pain.      [name] aspirin 81 MG tablet Take 81 mg by mouth daily.      . Glucosamine-Chondroit-Vit C-Mn (GLUCOSAMINE 1500 COMPLEX PO) Take 2 tablets by mouth daily.      [name] lisinopril-hydrochlorothiazide (PRINZIDE,ZESTORETIC) 20-25 MG per tablet Take 1 tablet by mouth daily.  90  tablet  3  . LORazepam (ATIVAN) 0.5 MG tablet Take 1 tablet (0.5 mg total) by mouth 2 (two) times daily as needed for anxiety.  20 tablet  0  . Multiple Vitamins-Minerals (CENTRUM SILVER ADULT 50+ PO) Take 1 capsule by mouth daily.       No current facility-administered medications for this visit.    Allergies  Allergen Reactions  . Codeine Other (See Comments)    Nightmares    Review of Systems  Constitutional: Positive for fatigue. Negative for unexpected weight change.  Respiratory: Positive for cough. Negative for wheezing.   Cardiovascular:       Jaw pain- not consistently exertional  Genitourinary:       Bladder tumor, kidney stones  Musculoskeletal: Positive for arthralgias.  Skin: Positive for rash.  Psychiatric/Behavioral: The patient is nervous/anxious.   All other systems reviewed and are negative.   BP 115/78  Pulse 92  Resp 20  Ht 5\' 11"  (1.803 m)  Wt 195 lb (88.451 kg)  BMI 27.21 kg/m2  SpO2 95% Physical Exam  Vitals reviewed. Constitutional: He is oriented to person, place, and time. He appears well-developed and well-nourished. No distress.  HENT:  Head: Normocephalic and atraumatic.  Eyes: EOM are normal. Pupils are equal, round, and reactive to light.  Neck: Neck supple. No thyromegaly present.  No carotid bruit  Cardiovascular: Normal rate, regular rhythm, normal heart sounds and intact distal pulses.  Exam reveals no friction rub.   No murmur heard. Pulmonary/Chest: Effort normal and breath sounds normal. He has no wheezes. He has no rales.  Abdominal: Soft. There is no tenderness.  Musculoskeletal: He exhibits no edema.  Lymphadenopathy:    He has no cervical adenopathy.  Neurological: He is alert and oriented to person, place, and time. No cranial nerve deficit.  Skin: Skin is warm and dry.     Diagnostic Tests: Cardiac catheterization Hemodynamic Findings:  Central aortic pressure: 113/67  Left ventricular pressure: 120/8/16   Angiographic Findings:  Left main: No obstructive disease.  Left Anterior Descending Artery: Large caliber vessel that courses to the apex. The proximal vessel has diffuse 30% stenosis followed by 100% occlusion after a large septal perforating branch. The mid and distal vessel fills from left to left collaterals.  Circumflex Artery: Large co-dominant vessel with three moderate caliber obtuse marginal branches. The proximal vessel has a focal 80% stenosis followed by an aneurysmal segment. The mid vessel has a 50% stenosis just before the takeoff of the first obtuse marginal branch. All three of the marginal branches are patent with minimal plaque disease.  Right Coronary Artery: Small to moderate caliber co-dominant vessel with 100% proximal occlusion. There  is a moderate caliber RV marginal branch that arises proximal to the occlusion of the proximal vessel. There appears to be filling of the small caliber distal RCA from right to right and left to right collaterals.  Left Ventricular Angiogram: LVEF=50% with subtle hypokinesis of the anterior wall with abnormal appearance at the LV apex, possible LV apical aneurysm.  Impression:  1. Triple vessel CAD with chronic total occlusion of the LAD and RCA, high grade disease in the proximal Circumflex.  2. Low normal LV systolic function  Recommendations: He is having no active chest pain or signs of unstable angina. Would proceed with the office based laser bladder procedure. I have discussed with Dr. [name]. Will refer to see CT surgery as an outpatient for CABG.    Impression: 58 year old gentleman with multiple cardiac risk factors who has critical three-vessel disease and moderate left ventricular dysfunction. Coronary bypass grafting is indicated for survival benefit. Release of symptoms is a little less certain in his case, since he is not having classic anginal symptoms.  I discussed the general nature of the procedure, the need for general  anesthesia, and the incisions to be used with Mr and Mrs Nichols. We discussed the expected hospital stay, overall recovery and short and long term outcomes. We discussed the indications, risks, benefits, and alternatives. They understand the risks include, but are not limited to death, stroke, MI, DVT/PE, bleeding, possible need for transfusion, infections, cardiac arrhythmias, and other organ system dysfunction including respiratory, renal, or GI complications.   He wishes to proceed as soon as possible.  Plan:  Coronary artery bypass grafting with bilateral internal mammary arteries and endoscopic vein harvest on Thursday, July 30

## 2014-02-06 ENCOUNTER — Encounter (HOSPITAL_COMMUNITY)
Admission: RE | Admit: 2014-02-06 | Discharge: 2014-02-06 | Disposition: A | Discharge status: Home / Self Care | Payer: BC Managed Care – PPO | Source: Ambulatory Visit | Attending: Thoracic Surgery (Cardiothoracic Vascular Surgery) | Admitting: Thoracic Surgery (Cardiothoracic Vascular Surgery)

## 2014-02-06 ENCOUNTER — Ambulatory Visit (HOSPITAL_COMMUNITY)
Admission: RE | Admit: 2014-02-06 | Discharge: 2014-02-06 | Disposition: A | Discharge status: Home / Self Care | Payer: BC Managed Care – PPO | Source: Ambulatory Visit | Attending: Thoracic Surgery (Cardiothoracic Vascular Surgery) | Admitting: Thoracic Surgery (Cardiothoracic Vascular Surgery)

## 2014-02-06 ENCOUNTER — Other Ambulatory Visit: Payer: Self-pay

## 2014-02-06 ENCOUNTER — Encounter (HOSPITAL_COMMUNITY): Payer: Self-pay

## 2014-02-06 VITALS — BP 120/79 | HR 77 | Temp 98.2°F | Resp 20 | Ht 71.0 in | Wt 194.7 lb

## 2014-02-06 DIAGNOSIS — I209 Angina pectoris, unspecified: Secondary | ICD-10-CM | POA: Insufficient documentation

## 2014-02-06 DIAGNOSIS — Z01810 Encounter for preprocedural cardiovascular examination: Secondary | ICD-10-CM

## 2014-02-06 DIAGNOSIS — I25111 Atherosclerotic heart disease of native coronary artery with angina pectoris with documented spasm: Secondary | ICD-10-CM

## 2014-02-06 DIAGNOSIS — I2510 Atherosclerotic heart disease of native coronary artery without angina pectoris: Secondary | ICD-10-CM | POA: Insufficient documentation

## 2014-02-06 HISTORY — DX: Adverse effect of unspecified anesthetic, initial encounter: T41.45XA

## 2014-02-06 HISTORY — DX: Other complications of anesthesia, initial encounter: T88.59XA

## 2014-02-06 HISTORY — DX: Atherosclerotic heart disease of native coronary artery without angina pectoris: I25.10

## 2014-02-06 LAB — BLOOD GAS, ARTERIAL
Acid-Base Excess: 2.3 mmol/L — ABNORMAL HIGH (ref 0.0–2.0)
Bicarbonate: 26.2 mEq/L — ABNORMAL HIGH (ref 20.0–24.0)
Drawn by: 24485
FIO2: 0.21 %
O2 Saturation: 94.8 %
Patient temperature: 98.6
TCO2: 27.4 mmol/L (ref 0–100)
pCO2 arterial: 40.2 mmHg (ref 35.0–45.0)
pH, Arterial: 7.43 (ref 7.350–7.450)
pO2, Arterial: 68.7 mmHg — ABNORMAL LOW (ref 80.0–100.0)

## 2014-02-06 LAB — COMPREHENSIVE METABOLIC PANEL
ALBUMIN: 4.1 g/dL (ref 3.5–5.2)
ALK PHOS: 82 U/L (ref 39–117)
ALT: 8 U/L (ref 0–53)
AST: 15 U/L (ref 0–37)
Anion gap: 16 — ABNORMAL HIGH (ref 5–15)
BILIRUBIN TOTAL: 0.5 mg/dL (ref 0.3–1.2)
BUN: 18 mg/dL (ref 6–23)
CHLORIDE: 99 meq/L (ref 96–112)
CO2: 28 meq/L (ref 19–32)
CREATININE: 0.78 mg/dL (ref 0.50–1.35)
Calcium: 9.8 mg/dL (ref 8.4–10.5)
GFR calc Af Amer: >90 mL/min (ref >90)
Glucose, Bld: 88 mg/dL (ref 70–99)
POTASSIUM: 3.6 meq/L — AB (ref 3.7–5.3)
Sodium: 143 mEq/L (ref 137–147)
Total Protein: 7.1 g/dL (ref 6.0–8.3)

## 2014-02-06 LAB — PULMONARY FUNCTION TEST
FEF 25-75 PRE: 1.06 L/s
FEF 25-75 Post: 0.76 L/sec
FEF2575-%Change-Post: -28 %
FEF2575-%PRED-POST: 24 %
FEF2575-%PRED-PRE: 34 %
FEV1-%Change-Post: 0 %
FEV1-%Pred-Post: 56 %
FEV1-%Pred-Pre: 55 %
FEV1-Post: 2.1 L
FEV1-Pre: 2.08 L
FEV1FVC-%Change-Post: 1 %
FEV1FVC-%Pred-Pre: 82 %
FEV6-%Change-Post: -1 %
FEV6-%PRED-POST: 68 %
FEV6-%Pred-Pre: 69 %
FEV6-POST: 3.22 L
FEV6-Pre: 3.26 L
FEV6FVC-%CHANGE-POST: 0 %
FEV6FVC-%PRED-PRE: 101 %
FEV6FVC-%Pred-Post: 100 %
FVC-%CHANGE-POST: 0 %
FVC-%PRED-PRE: 67 %
FVC-%Pred-Post: 67 %
FVC-POST: 3.32 L
FVC-Pre: 3.34 L
Post FEV1/FVC ratio: 63 %
Post FEV6/FVC ratio: 97 %
Pre FEV1/FVC ratio: 62 %
Pre FEV6/FVC Ratio: 98 %
RV % pred: 120 %
RV: 2.75 L
TLC % PRED: 88 %
TLC: 6.35 L

## 2014-02-06 LAB — CBC
HEMATOCRIT: 46.1 % (ref 39.0–52.0)
Hemoglobin: 16 g/dL (ref 13.0–17.0)
MCH: 32.4 pg (ref 26.0–34.0)
MCHC: 34.7 g/dL (ref 30.0–36.0)
MCV: 93.3 fL (ref 78.0–100.0)
Platelets: 350 10*3/uL (ref 150–400)
RBC: 4.94 MIL/uL (ref 4.22–5.81)
RDW: 13.4 % (ref 11.5–15.5)
WBC: 12.5 10*3/uL — AB (ref 4.0–10.5)

## 2014-02-06 LAB — URINE MICROSCOPIC-ADD ON

## 2014-02-06 LAB — URINALYSIS, ROUTINE W REFLEX MICROSCOPIC
Bilirubin Urine: NEGATIVE
GLUCOSE, UA: NEGATIVE mg/dL
Hgb urine dipstick: NEGATIVE
KETONES UR: NEGATIVE mg/dL
NITRITE: NEGATIVE
PROTEIN: NEGATIVE mg/dL
Specific Gravity, Urine: 1.022 (ref 1.005–1.030)
UROBILINOGEN UA: 1 mg/dL (ref 0.0–1.0)
pH: 5 (ref 5.0–8.0)

## 2014-02-06 LAB — TYPE AND SCREEN
ABO/RH(D): A POS
Antibody Screen: NEGATIVE

## 2014-02-06 LAB — HEMOGLOBIN A1C
Hgb A1c MFr Bld: 5.8 % — ABNORMAL HIGH (ref <5.7)
Mean Plasma Glucose: 120 mg/dL — ABNORMAL HIGH (ref <117)

## 2014-02-06 LAB — PROTIME-INR
INR: 0.9 (ref 0.00–1.49)
Prothrombin Time: 12.2 seconds (ref 11.6–15.2)

## 2014-02-06 LAB — SURGICAL PCR SCREEN
MRSA, PCR: POSITIVE — AB
Staphylococcus aureus: POSITIVE — AB

## 2014-02-06 LAB — APTT: aPTT: 30 seconds (ref 24–37)

## 2014-02-06 MED ORDER — CHLORHEXIDINE GLUCONATE 4 % EX LIQD
30.0000 mL | CUTANEOUS | Status: DC
  Filled 2014-02-06: qty 30

## 2014-02-06 MED ORDER — VANCOMYCIN HCL 10 G IV SOLR
1250.0000 mg | INTRAVENOUS | Status: AC
  Administered 2014-02-07: 1250 mg via INTRAVENOUS
  Filled 2014-02-06: qty 1250

## 2014-02-06 MED ORDER — PLASMA-LYTE 148 IV SOLN
INTRAVENOUS | Status: AC
  Administered 2014-02-07: 09:00:00
  Filled 2014-02-06: qty 2.5

## 2014-02-06 MED ORDER — SODIUM CHLORIDE 0.9 % IV SOLN
INTRAVENOUS | Status: DC
  Filled 2014-02-06: qty 40

## 2014-02-06 MED ORDER — SODIUM CHLORIDE 0.9 % IV SOLN
INTRAVENOUS | Status: DC
  Filled 2014-02-06: qty 1

## 2014-02-06 MED ORDER — METOPROLOL TARTRATE 12.5 MG HALF TABLET
12.5000 mg | ORAL_TABLET | Freq: Once | ORAL | Status: AC
  Administered 2014-02-07: 12.5 mg via ORAL

## 2014-02-06 MED ORDER — DEXTROSE 5 % IV SOLN
750.0000 mg | INTRAVENOUS | Status: DC
  Filled 2014-02-06 (×2): qty 750

## 2014-02-06 MED ORDER — DEXTROSE 5 % IV SOLN
1.5000 g | INTRAVENOUS | Status: AC
  Administered 2014-02-07: .75 g via INTRAVENOUS
  Administered 2014-02-07: 1.5 g via INTRAVENOUS
  Filled 2014-02-06: qty 1.5

## 2014-02-06 MED ORDER — ALBUTEROL SULFATE (2.5 MG/3ML) 0.083% IN NEBU
2.5000 mg | INHALATION_SOLUTION | Freq: Once | RESPIRATORY_TRACT | Status: AC
  Administered 2014-02-06: 2.5 mg via RESPIRATORY_TRACT

## 2014-02-06 MED ORDER — EPINEPHRINE HCL 1 MG/ML IJ SOLN
0.5000 ug/min | INTRAVENOUS | Status: DC
  Filled 2014-02-06: qty 4

## 2014-02-06 MED ORDER — DOPAMINE-DEXTROSE 3.2-5 MG/ML-% IV SOLN
2.0000 ug/kg/min | INTRAVENOUS | Status: DC
  Filled 2014-02-06: qty 250

## 2014-02-06 MED ORDER — POTASSIUM CHLORIDE 2 MEQ/ML IV SOLN
80.0000 meq | INTRAVENOUS | Status: DC
  Filled 2014-02-06: qty 40

## 2014-02-06 MED ORDER — SODIUM CHLORIDE 0.9 % IV SOLN
INTRAVENOUS | Status: DC
  Filled 2014-02-06: qty 30

## 2014-02-06 MED ORDER — NITROGLYCERIN IN D5W 200-5 MCG/ML-% IV SOLN
2.0000 ug/min | INTRAVENOUS | Status: DC
  Filled 2014-02-06: qty 250

## 2014-02-06 MED ORDER — PHENYLEPHRINE HCL 10 MG/ML IJ SOLN
30.0000 ug/min | INTRAVENOUS | Status: DC
  Filled 2014-02-06: qty 2

## 2014-02-06 MED ORDER — DEXMEDETOMIDINE HCL IN NACL 400 MCG/100ML IV SOLN
0.1000 ug/kg/h | INTRAVENOUS | Status: DC
  Filled 2014-02-06: qty 100

## 2014-02-06 MED ORDER — MAGNESIUM SULFATE 50 % IJ SOLN
40.0000 meq | INTRAMUSCULAR | Status: DC
Start: 2014-02-07 — End: 2014-02-07
  Filled 2014-02-06: qty 10

## 2014-02-06 NOTE — Progress Notes (Signed)
VASCULAR LAB PRELIMINARY  PRELIMINARY  PRELIMINARY  PRELIMINARY  Pre-op Cardiac Surgery  Carotid Findings:  Bilateral:  1-39% ICA stenosis.  Vertebral artery flow is antegrade.     Upper Extremity Right Left  Brachial Pressures 114 triphasic 104 triphasic  Radial Waveforms triphasic triphasic  Ulnar Waveforms triphasic triphasic  Palmar Arch (Allen's Test) * **   Findings:  *Rigtht:  Doppler waveforms obliterate with ulnar and remain normal with radial compressions.  **Left:  Doppler waveforms remain normal with ulnar and radial compressions.    Lower  Extremity Right Left  Dorsalis Pedis    Anterior Tibial    Posterior Tibial    Ankle/Brachial Indices      Findings:  Palpable pedal pulses x 4.   Komal Stangelo, RVT 02/06/2014, 12:41 PM

## 2014-02-07 ENCOUNTER — Inpatient Hospital Stay (HOSPITAL_COMMUNITY): Payer: BC Managed Care – PPO

## 2014-02-07 ENCOUNTER — Encounter (HOSPITAL_COMMUNITY)
Admission: RE | Disposition: A | Discharge status: Home / Self Care | Payer: BC Managed Care – PPO | Source: Ambulatory Visit | Attending: Thoracic Surgery (Cardiothoracic Vascular Surgery)

## 2014-02-07 ENCOUNTER — Encounter (HOSPITAL_COMMUNITY): Payer: BC Managed Care – PPO | Admitting: Certified Registered Nurse Anesthetist

## 2014-02-07 ENCOUNTER — Encounter (HOSPITAL_COMMUNITY): Payer: Self-pay | Admitting: Certified Registered"

## 2014-02-07 ENCOUNTER — Encounter (HOSPITAL_COMMUNITY): Payer: BC Managed Care – PPO | Admitting: Certified Registered"

## 2014-02-07 ENCOUNTER — Inpatient Hospital Stay (HOSPITAL_COMMUNITY): Payer: BC Managed Care – PPO | Admitting: Certified Registered"

## 2014-02-07 ENCOUNTER — Inpatient Hospital Stay (HOSPITAL_COMMUNITY)
Admission: RE | Admit: 2014-02-07 | Discharge: 2014-02-13 | DRG: 235 | Disposition: A | Discharge status: Home / Self Care | Payer: BC Managed Care – PPO | Source: Ambulatory Visit | Attending: Thoracic Surgery (Cardiothoracic Vascular Surgery) | Admitting: Thoracic Surgery (Cardiothoracic Vascular Surgery)

## 2014-02-07 ENCOUNTER — Inpatient Hospital Stay (HOSPITAL_COMMUNITY): Payer: BC Managed Care – PPO | Admitting: Certified Registered Nurse Anesthetist

## 2014-02-07 DIAGNOSIS — R229 Localized swelling, mass and lump, unspecified: Secondary | ICD-10-CM

## 2014-02-07 DIAGNOSIS — E8779 Other fluid overload: Secondary | ICD-10-CM | POA: Diagnosis present

## 2014-02-07 DIAGNOSIS — R7309 Other abnormal glucose: Secondary | ICD-10-CM | POA: Diagnosis present

## 2014-02-07 DIAGNOSIS — I25111 Atherosclerotic heart disease of native coronary artery with angina pectoris with documented spasm: Secondary | ICD-10-CM

## 2014-02-07 DIAGNOSIS — T380X5A Adverse effect of glucocorticoids and synthetic analogues, initial encounter: Secondary | ICD-10-CM | POA: Diagnosis present

## 2014-02-07 DIAGNOSIS — Z87891 Personal history of nicotine dependence: Secondary | ICD-10-CM | POA: Diagnosis present

## 2014-02-07 DIAGNOSIS — J441 Chronic obstructive pulmonary disease with (acute) exacerbation: Secondary | ICD-10-CM | POA: Diagnosis not present

## 2014-02-07 DIAGNOSIS — I2582 Chronic total occlusion of coronary artery: Secondary | ICD-10-CM | POA: Diagnosis present

## 2014-02-07 DIAGNOSIS — Z885 Allergy status to narcotic agent status: Secondary | ICD-10-CM | POA: Diagnosis not present

## 2014-02-07 DIAGNOSIS — Y9210 Unspecified residential institution as the place of occurrence of the external cause: Secondary | ICD-10-CM | POA: Diagnosis present

## 2014-02-07 DIAGNOSIS — I6509 Occlusion and stenosis of unspecified vertebral artery: Secondary | ICD-10-CM

## 2014-02-07 DIAGNOSIS — Z7982 Long term (current) use of aspirin: Secondary | ICD-10-CM | POA: Diagnosis not present

## 2014-02-07 DIAGNOSIS — I10 Essential (primary) hypertension: Secondary | ICD-10-CM | POA: Diagnosis present

## 2014-02-07 DIAGNOSIS — I200 Unstable angina: Secondary | ICD-10-CM

## 2014-02-07 DIAGNOSIS — M5417 Radiculopathy, lumbosacral region: Secondary | ICD-10-CM

## 2014-02-07 DIAGNOSIS — Z8249 Family history of ischemic heart disease and other diseases of the circulatory system: Secondary | ICD-10-CM

## 2014-02-07 DIAGNOSIS — Z79899 Other long term (current) drug therapy: Secondary | ICD-10-CM | POA: Diagnosis not present

## 2014-02-07 DIAGNOSIS — I739 Peripheral vascular disease, unspecified: Secondary | ICD-10-CM | POA: Diagnosis present

## 2014-02-07 DIAGNOSIS — F17200 Nicotine dependence, unspecified, uncomplicated: Secondary | ICD-10-CM | POA: Diagnosis present

## 2014-02-07 DIAGNOSIS — D369 Benign neoplasm, unspecified site: Secondary | ICD-10-CM

## 2014-02-07 DIAGNOSIS — E785 Hyperlipidemia, unspecified: Secondary | ICD-10-CM | POA: Diagnosis present

## 2014-02-07 DIAGNOSIS — M1990 Unspecified osteoarthritis, unspecified site: Secondary | ICD-10-CM

## 2014-02-07 DIAGNOSIS — R739 Hyperglycemia, unspecified: Secondary | ICD-10-CM

## 2014-02-07 DIAGNOSIS — Z95828 Presence of other vascular implants and grafts: Secondary | ICD-10-CM

## 2014-02-07 DIAGNOSIS — J90 Pleural effusion, not elsewhere classified: Secondary | ICD-10-CM | POA: Diagnosis present

## 2014-02-07 DIAGNOSIS — E7800 Pure hypercholesterolemia, unspecified: Secondary | ICD-10-CM | POA: Diagnosis present

## 2014-02-07 DIAGNOSIS — I2510 Atherosclerotic heart disease of native coronary artery without angina pectoris: Principal | ICD-10-CM | POA: Diagnosis present

## 2014-02-07 DIAGNOSIS — Z9889 Other specified postprocedural states: Secondary | ICD-10-CM | POA: Diagnosis present

## 2014-02-07 DIAGNOSIS — R1030 Lower abdominal pain, unspecified: Secondary | ICD-10-CM

## 2014-02-07 DIAGNOSIS — C679 Malignant neoplasm of bladder, unspecified: Secondary | ICD-10-CM | POA: Diagnosis present

## 2014-02-07 DIAGNOSIS — F411 Generalized anxiety disorder: Secondary | ICD-10-CM | POA: Diagnosis not present

## 2014-02-07 DIAGNOSIS — R9431 Abnormal electrocardiogram [ECG] [EKG]: Secondary | ICD-10-CM

## 2014-02-07 DIAGNOSIS — I714 Abdominal aortic aneurysm, without rupture, unspecified: Secondary | ICD-10-CM

## 2014-02-07 DIAGNOSIS — D62 Acute posthemorrhagic anemia: Secondary | ICD-10-CM | POA: Diagnosis not present

## 2014-02-07 DIAGNOSIS — Z8551 Personal history of malignant neoplasm of bladder: Secondary | ICD-10-CM | POA: Diagnosis present

## 2014-02-07 DIAGNOSIS — J9620 Acute and chronic respiratory failure, unspecified whether with hypoxia or hypercapnia: Secondary | ICD-10-CM | POA: Diagnosis present

## 2014-02-07 DIAGNOSIS — Z951 Presence of aortocoronary bypass graft: Secondary | ICD-10-CM

## 2014-02-07 DIAGNOSIS — Z8669 Personal history of other diseases of the nervous system and sense organs: Secondary | ICD-10-CM

## 2014-02-07 DIAGNOSIS — R21 Rash and other nonspecific skin eruption: Secondary | ICD-10-CM

## 2014-02-07 DIAGNOSIS — Z01810 Encounter for preprocedural cardiovascular examination: Secondary | ICD-10-CM

## 2014-02-07 DIAGNOSIS — Z0000 Encounter for general adult medical examination without abnormal findings: Secondary | ICD-10-CM

## 2014-02-07 DIAGNOSIS — J189 Pneumonia, unspecified organism: Secondary | ICD-10-CM

## 2014-02-07 HISTORY — PX: INTRAOPERATIVE TRANSESOPHAGEAL ECHOCARDIOGRAM: SHX5062

## 2014-02-07 HISTORY — PX: CORONARY ARTERY BYPASS GRAFT: SHX141

## 2014-02-07 LAB — POCT I-STAT 3, ART BLOOD GAS (G3+)
ACID-BASE DEFICIT: 1 mmol/L (ref 0.0–2.0)
ACID-BASE DEFICIT: 2 mmol/L (ref 0.0–2.0)
ACID-BASE DEFICIT: 3 mmol/L — AB (ref 0.0–2.0)
ACID-BASE EXCESS: 1 mmol/L (ref 0.0–2.0)
Acid-Base Excess: 2 mmol/L (ref 0.0–2.0)
Acid-base deficit: 2 mmol/L (ref 0.0–2.0)
Acid-base deficit: 3 mmol/L — ABNORMAL HIGH (ref 0.0–2.0)
BICARBONATE: 24.6 meq/L — AB (ref 20.0–24.0)
BICARBONATE: 23.4 meq/L (ref 20.0–24.0)
BICARBONATE: 26.7 meq/L — AB (ref 20.0–24.0)
BICARBONATE: 23.2 meq/L (ref 20.0–24.0)
Bicarbonate: 27.4 mEq/L — ABNORMAL HIGH (ref 20.0–24.0)
Bicarbonate: 23.9 mEq/L (ref 20.0–24.0)
Bicarbonate: 25.9 mEq/L — ABNORMAL HIGH (ref 20.0–24.0)
O2 SAT: 94 %
O2 Saturation: 100 %
O2 Saturation: 100 %
O2 Saturation: 76 %
O2 Saturation: 97 %
O2 Saturation: 95 %
O2 Saturation: 84 %
PCO2 ART: 52.6 mmHg — AB (ref 35.0–45.0)
PCO2 ART: 47.6 mmHg — AB (ref 35.0–45.0)
PH ART: 7.38 (ref 7.350–7.450)
PH ART: 7.298 — AB (ref 7.350–7.450)
PH ART: 7.299 — AB (ref 7.350–7.450)
PO2 ART: 78 mmHg — AB (ref 80.0–100.0)
PO2 ART: 57 mmHg — AB (ref 80.0–100.0)
Patient temperature: 35.9
Patient temperature: 37
Patient temperature: 37.7
TCO2: 29 mmol/L (ref 0–100)
TCO2: 25 mmol/L (ref 0–100)
TCO2: 26 mmol/L (ref 0–100)
TCO2: 25 mmol/L (ref 0–100)
TCO2: 28 mmol/L (ref 0–100)
TCO2: 27 mmol/L (ref 0–100)
TCO2: 25 mmol/L (ref 0–100)
pCO2 arterial: 46.4 mmHg — ABNORMAL HIGH (ref 35.0–45.0)
pCO2 arterial: 36 mmHg (ref 35.0–45.0)
pCO2 arterial: 43.1 mmHg (ref 35.0–45.0)
pCO2 arterial: 47.8 mmHg — ABNORMAL HIGH (ref 35.0–45.0)
pCO2 arterial: 53.1 mmHg — ABNORMAL HIGH (ref 35.0–45.0)
pH, Arterial: 7.352 (ref 7.350–7.450)
pH, Arterial: 7.442 (ref 7.350–7.450)
pH, Arterial: 7.308 — ABNORMAL LOW (ref 7.350–7.450)
pH, Arterial: 7.296 — ABNORMAL LOW (ref 7.350–7.450)
pO2, Arterial: 417 mmHg — ABNORMAL HIGH (ref 80.0–100.0)
pO2, Arterial: 344 mmHg — ABNORMAL HIGH (ref 80.0–100.0)
pO2, Arterial: 43 mmHg — ABNORMAL LOW (ref 80.0–100.0)
pO2, Arterial: 100 mmHg (ref 80.0–100.0)
pO2, Arterial: 77 mmHg — ABNORMAL LOW (ref 80.0–100.0)

## 2014-02-07 LAB — POCT I-STAT, CHEM 8
BUN: 24 mg/dL — AB (ref 6–23)
BUN: 22 mg/dL (ref 6–23)
BUN: 16 mg/dL (ref 6–23)
BUN: 20 mg/dL (ref 6–23)
BUN: 20 mg/dL (ref 6–23)
BUN: 17 mg/dL (ref 6–23)
CALCIUM ION: 1.19 mmol/L (ref 1.12–1.23)
CALCIUM ION: 1.01 mmol/L — AB (ref 1.12–1.23)
CHLORIDE: 101 meq/L (ref 96–112)
CHLORIDE: 101 meq/L (ref 96–112)
CHLORIDE: 101 meq/L (ref 96–112)
CHLORIDE: 98 meq/L (ref 96–112)
CREATININE: 0.5 mg/dL (ref 0.50–1.35)
Calcium, Ion: 1.2 mmol/L (ref 1.12–1.23)
Calcium, Ion: 0.85 mmol/L — ABNORMAL LOW (ref 1.12–1.23)
Calcium, Ion: 1.01 mmol/L — ABNORMAL LOW (ref 1.12–1.23)
Calcium, Ion: 1.16 mmol/L (ref 1.12–1.23)
Chloride: 102 mEq/L (ref 96–112)
Chloride: 111 mEq/L (ref 96–112)
Creatinine, Ser: 0.5 mg/dL (ref 0.50–1.35)
Creatinine, Ser: 0.3 mg/dL — ABNORMAL LOW (ref 0.50–1.35)
Creatinine, Ser: 0.4 mg/dL — ABNORMAL LOW (ref 0.50–1.35)
Creatinine, Ser: 0.6 mg/dL (ref 0.50–1.35)
Creatinine, Ser: 0.6 mg/dL (ref 0.50–1.35)
GLUCOSE: 102 mg/dL — AB (ref 70–99)
Glucose, Bld: 103 mg/dL — ABNORMAL HIGH (ref 70–99)
Glucose, Bld: 81 mg/dL (ref 70–99)
Glucose, Bld: 125 mg/dL — ABNORMAL HIGH (ref 70–99)
Glucose, Bld: 123 mg/dL — ABNORMAL HIGH (ref 70–99)
Glucose, Bld: 165 mg/dL — ABNORMAL HIGH (ref 70–99)
HCT: 29 % — ABNORMAL LOW (ref 39.0–52.0)
HCT: 30 % — ABNORMAL LOW (ref 39.0–52.0)
HCT: 33 % — ABNORMAL LOW (ref 39.0–52.0)
HCT: 40 % (ref 39.0–52.0)
HEMATOCRIT: 41 % (ref 39.0–52.0)
HEMATOCRIT: 40 % (ref 39.0–52.0)
HEMOGLOBIN: 13.6 g/dL (ref 13.0–17.0)
Hemoglobin: 13.9 g/dL (ref 13.0–17.0)
Hemoglobin: 9.9 g/dL — ABNORMAL LOW (ref 13.0–17.0)
Hemoglobin: 10.2 g/dL — ABNORMAL LOW (ref 13.0–17.0)
Hemoglobin: 11.2 g/dL — ABNORMAL LOW (ref 13.0–17.0)
Hemoglobin: 13.6 g/dL (ref 13.0–17.0)
POTASSIUM: 3.9 meq/L (ref 3.7–5.3)
POTASSIUM: 4.1 meq/L (ref 3.7–5.3)
POTASSIUM: 4.2 meq/L (ref 3.7–5.3)
Potassium: 4.1 mEq/L (ref 3.7–5.3)
Potassium: 4 mEq/L (ref 3.7–5.3)
Potassium: 4.3 mEq/L (ref 3.7–5.3)
SODIUM: 138 meq/L (ref 137–147)
SODIUM: 138 meq/L (ref 137–147)
Sodium: 139 mEq/L (ref 137–147)
Sodium: 137 mEq/L (ref 137–147)
Sodium: 133 mEq/L — ABNORMAL LOW (ref 137–147)
Sodium: 133 mEq/L — ABNORMAL LOW (ref 137–147)
TCO2: 26 mmol/L (ref 0–100)
TCO2: 27 mmol/L (ref 0–100)
TCO2: 23 mmol/L (ref 0–100)
TCO2: 23 mmol/L (ref 0–100)
TCO2: 24 mmol/L (ref 0–100)
TCO2: 24 mmol/L (ref 0–100)

## 2014-02-07 LAB — CBC
HCT: 37.7 % — ABNORMAL LOW (ref 39.0–52.0)
HEMATOCRIT: 38.3 % — AB (ref 39.0–52.0)
HEMOGLOBIN: 13.1 g/dL (ref 13.0–17.0)
Hemoglobin: 12.7 g/dL — ABNORMAL LOW (ref 13.0–17.0)
MCH: 31.5 pg (ref 26.0–34.0)
MCH: 32.5 pg (ref 26.0–34.0)
MCHC: 33.7 g/dL (ref 30.0–36.0)
MCHC: 34.2 g/dL (ref 30.0–36.0)
MCV: 93.5 fL (ref 78.0–100.0)
MCV: 95 fL (ref 78.0–100.0)
Platelets: 196 10*3/uL (ref 150–400)
Platelets: 195 10*3/uL (ref 150–400)
RBC: 4.03 MIL/uL — ABNORMAL LOW (ref 4.22–5.81)
RBC: 4.03 MIL/uL — AB (ref 4.22–5.81)
RDW: 13.2 % (ref 11.5–15.5)
RDW: 13.2 % (ref 11.5–15.5)
WBC: 19.8 10*3/uL — AB (ref 4.0–10.5)
WBC: 21.2 10*3/uL — AB (ref 4.0–10.5)

## 2014-02-07 LAB — HEMOGLOBIN AND HEMATOCRIT, BLOOD
HCT: 31.6 % — ABNORMAL LOW (ref 39.0–52.0)
HEMOGLOBIN: 11 g/dL — AB (ref 13.0–17.0)

## 2014-02-07 LAB — GLUCOSE, CAPILLARY
GLUCOSE-CAPILLARY: 155 mg/dL — AB (ref 70–99)
Glucose-Capillary: 99 mg/dL (ref 70–99)
Glucose-Capillary: 97 mg/dL (ref 70–99)
Glucose-Capillary: 132 mg/dL — ABNORMAL HIGH (ref 70–99)
Glucose-Capillary: 142 mg/dL — ABNORMAL HIGH (ref 70–99)
Glucose-Capillary: 160 mg/dL — ABNORMAL HIGH (ref 70–99)

## 2014-02-07 LAB — CREATININE, SERUM: Creatinine, Ser: 0.56 mg/dL (ref 0.50–1.35)

## 2014-02-07 LAB — MAGNESIUM: Magnesium: 2.8 mg/dL — ABNORMAL HIGH (ref 1.5–2.5)

## 2014-02-07 LAB — POCT I-STAT 4, (NA,K, GLUC, HGB,HCT)
Glucose, Bld: 115 mg/dL — ABNORMAL HIGH (ref 70–99)
HCT: 38 % — ABNORMAL LOW (ref 39.0–52.0)
Hemoglobin: 12.9 g/dL — ABNORMAL LOW (ref 13.0–17.0)
Potassium: 4 mEq/L (ref 3.7–5.3)
SODIUM: 136 meq/L — AB (ref 137–147)

## 2014-02-07 LAB — POCT I-STAT GLUCOSE
Glucose, Bld: 103 mg/dL — ABNORMAL HIGH (ref 70–99)
Operator id: 156951

## 2014-02-07 LAB — PROTIME-INR
INR: 1.23 (ref 0.00–1.49)
Prothrombin Time: 15.5 seconds — ABNORMAL HIGH (ref 11.6–15.2)

## 2014-02-07 LAB — APTT: APTT: 31 s (ref 24–37)

## 2014-02-07 LAB — PLATELET COUNT: PLATELETS: 192 10*3/uL (ref 150–400)

## 2014-02-07 SURGERY — CORONARY ARTERY BYPASS GRAFTING (CABG)
Anesthesia: General | Site: Chest

## 2014-02-07 MED ORDER — SODIUM CHLORIDE 0.45 % IV SOLN
INTRAVENOUS | Status: DC
  Administered 2014-02-07: 14:00:00 via INTRAVENOUS

## 2014-02-07 MED ORDER — SODIUM CHLORIDE 0.9 % IJ SOLN: 3.0000 mL | INTRAMUSCULAR | Status: DC | PRN

## 2014-02-07 MED ORDER — ARTIFICIAL TEARS OP OINT
TOPICAL_OINTMENT | OPHTHALMIC | Status: AC
  Filled 2014-02-07: qty 3.5

## 2014-02-07 MED ORDER — DEXTROSE 5 % IV SOLN
20.0000 mg | INTRAVENOUS | Status: DC | PRN
  Administered 2014-02-07: 80 ug/min via INTRAVENOUS

## 2014-02-07 MED ORDER — SODIUM BICARBONATE 8.4 % IV SOLN
50.0000 meq | Freq: Once | INTRAVENOUS | Status: AC
Start: 2014-02-07 — End: 2014-02-07
  Administered 2014-02-07: 50 meq via INTRAVENOUS
  Filled 2014-02-07: qty 50

## 2014-02-07 MED ORDER — VECURONIUM BROMIDE 10 MG IV SOLR
INTRAVENOUS | Status: AC
  Filled 2014-02-07: qty 10

## 2014-02-07 MED ORDER — LACTATED RINGERS IV SOLN
INTRAVENOUS | Status: DC | PRN
  Administered 2014-02-07 (×2): via INTRAVENOUS

## 2014-02-07 MED ORDER — LORAZEPAM 2 MG/ML IJ SOLN
INTRAMUSCULAR | Status: AC
  Administered 2014-02-07: 0.5 mg via INTRAVENOUS
  Filled 2014-02-07: qty 1

## 2014-02-07 MED ORDER — SODIUM CHLORIDE 0.9 % IV SOLN
10.0000 g | INTRAVENOUS | Status: DC | PRN
  Administered 2014-02-07: 5 g/h via INTRAVENOUS

## 2014-02-07 MED ORDER — HEMOSTATIC AGENTS (NO CHARGE) OPTIME
TOPICAL | Status: DC | PRN
  Administered 2014-02-07: 1 via TOPICAL

## 2014-02-07 MED ORDER — METOPROLOL TARTRATE 25 MG/10 ML ORAL SUSPENSION
12.5000 mg | Freq: Two times a day (BID) | ORAL | Status: DC
  Filled 2014-02-07 (×5): qty 5

## 2014-02-07 MED ORDER — PROTAMINE SULFATE 10 MG/ML IV SOLN
INTRAVENOUS | Status: DC | PRN
  Administered 2014-02-07: 190 mg via INTRAVENOUS

## 2014-02-07 MED ORDER — METOPROLOL TARTRATE 1 MG/ML IV SOLN: 2.5000 mg | INTRAVENOUS | Status: DC | PRN

## 2014-02-07 MED ORDER — MORPHINE SULFATE 2 MG/ML IJ SOLN
2.0000 mg | INTRAMUSCULAR | Status: DC | PRN
  Administered 2014-02-07 (×2): 2 mg via INTRAVENOUS
  Administered 2014-02-07: 5 mg via INTRAVENOUS
  Administered 2014-02-07 (×3): 2 mg via INTRAVENOUS
  Administered 2014-02-07 – 2014-02-08 (×6): 4 mg via INTRAVENOUS
  Administered 2014-02-08: 2 mg via INTRAVENOUS
  Administered 2014-02-08 (×2): 4 mg via INTRAVENOUS
  Filled 2014-02-07 (×2): qty 1
  Filled 2014-02-07 (×10): qty 2
  Filled 2014-02-07: qty 1
  Filled 2014-02-07: qty 2

## 2014-02-07 MED ORDER — SUCCINYLCHOLINE CHLORIDE 20 MG/ML IJ SOLN
INTRAMUSCULAR | Status: AC
  Filled 2014-02-07: qty 1

## 2014-02-07 MED ORDER — SUFENTANIL CITRATE 50 MCG/ML IV SOLN
INTRAVENOUS | Status: DC | PRN
  Administered 2014-02-07: 20 ug via INTRAVENOUS
  Administered 2014-02-07: 30 ug via INTRAVENOUS
  Administered 2014-02-07: 10 ug via INTRAVENOUS
  Administered 2014-02-07: 20 ug via INTRAVENOUS
  Administered 2014-02-07: 50 ug via INTRAVENOUS
  Administered 2014-02-07: 30 ug via INTRAVENOUS
  Administered 2014-02-07: 40 ug via INTRAVENOUS
  Administered 2014-02-07: 20 ug via INTRAVENOUS

## 2014-02-07 MED ORDER — HEPARIN SODIUM (PORCINE) 1000 UNIT/ML IJ SOLN
INTRAMUSCULAR | Status: DC | PRN
  Administered 2014-02-07: 26000 [IU] via INTRAVENOUS
  Administered 2014-02-07: 5000 [IU] via INTRAVENOUS

## 2014-02-07 MED ORDER — INSULIN ASPART 100 UNIT/ML ~~LOC~~ SOLN
0.0000 [IU] | SUBCUTANEOUS | Status: DC
  Administered 2014-02-07 – 2014-02-08 (×2): 2 [IU] via SUBCUTANEOUS
  Administered 2014-02-08: 4 [IU] via SUBCUTANEOUS

## 2014-02-07 MED ORDER — ARTIFICIAL TEARS OP OINT
TOPICAL_OINTMENT | OPHTHALMIC | Status: DC | PRN
  Administered 2014-02-07: 1 via OPHTHALMIC

## 2014-02-07 MED ORDER — SUFENTANIL CITRATE 250 MCG/5ML IV SOLN
INTRAVENOUS | Status: AC
  Filled 2014-02-07: qty 5

## 2014-02-07 MED ORDER — NITROGLYCERIN IN D5W 200-5 MCG/ML-% IV SOLN: 0.0000 ug/min | INTRAVENOUS | Status: DC

## 2014-02-07 MED ORDER — PHENYLEPHRINE HCL 10 MG/ML IJ SOLN
0.0000 ug/min | INTRAVENOUS | Status: DC
  Administered 2014-02-07: 20 ug/min via INTRAVENOUS
  Administered 2014-02-07: 40 ug/min via INTRAVENOUS
  Filled 2014-02-07 (×2): qty 2

## 2014-02-07 MED ORDER — PROPOFOL 10 MG/ML IV BOLUS
INTRAVENOUS | Status: AC
  Filled 2014-02-07: qty 20

## 2014-02-07 MED ORDER — SODIUM BICARBONATE 4.2 % IV SOLN
25.0000 meq | Freq: Once | INTRAVENOUS | Status: AC
  Filled 2014-02-07: qty 50

## 2014-02-07 MED ORDER — SUCCINYLCHOLINE CHLORIDE 20 MG/ML IJ SOLN
INTRAMUSCULAR | Status: AC
Start: 2014-02-07 — End: 2014-02-07
  Filled 2014-02-07: qty 1

## 2014-02-07 MED ORDER — ACETAMINOPHEN 160 MG/5ML PO SOLN
1000.0000 mg | Freq: Four times a day (QID) | ORAL | Status: AC
  Administered 2014-02-07: 1000 mg
  Filled 2014-02-07: qty 40.6

## 2014-02-07 MED ORDER — ACETAMINOPHEN 500 MG PO TABS
1000.0000 mg | ORAL_TABLET | Freq: Four times a day (QID) | ORAL | Status: AC
  Administered 2014-02-08 – 2014-02-12 (×12): 1000 mg via ORAL
  Filled 2014-02-07 (×18): qty 2

## 2014-02-07 MED ORDER — 0.9 % SODIUM CHLORIDE (POUR BTL) OPTIME
TOPICAL | Status: DC | PRN
  Administered 2014-02-07: 1000 mL

## 2014-02-07 MED ORDER — CHLORHEXIDINE GLUCONATE CLOTH 2 % EX PADS
6.0000 | MEDICATED_PAD | Freq: Every day | CUTANEOUS | Status: DC
  Administered 2014-02-08 – 2014-02-10 (×2): 6 via TOPICAL

## 2014-02-07 MED ORDER — BISACODYL 10 MG RE SUPP: 10.0000 mg | Freq: Every day | RECTAL | Status: DC

## 2014-02-07 MED ORDER — NITROGLYCERIN IN D5W 200-5 MCG/ML-% IV SOLN
INTRAVENOUS | Status: DC | PRN
  Administered 2014-02-07: 5 ug/min via INTRAVENOUS

## 2014-02-07 MED ORDER — PROPOFOL 10 MG/ML IV BOLUS
INTRAVENOUS | Status: DC | PRN
  Administered 2014-02-07: 30 mg via INTRAVENOUS

## 2014-02-07 MED ORDER — ALBUTEROL SULFATE (2.5 MG/3ML) 0.083% IN NEBU
2.5000 mg | INHALATION_SOLUTION | RESPIRATORY_TRACT | Status: DC | PRN
Start: 2014-02-07 — End: 2014-02-09
  Administered 2014-02-07: 5 mg via RESPIRATORY_TRACT
  Administered 2014-02-07 – 2014-02-09 (×3): 2.5 mg via RESPIRATORY_TRACT
  Filled 2014-02-07 (×5): qty 3

## 2014-02-07 MED ORDER — MIDAZOLAM HCL 10 MG/2ML IJ SOLN
INTRAMUSCULAR | Status: AC
  Filled 2014-02-07: qty 2

## 2014-02-07 MED ORDER — LORAZEPAM 2 MG/ML IJ SOLN
0.5000 mg | Freq: Four times a day (QID) | INTRAMUSCULAR | Status: DC | PRN
  Administered 2014-02-07 – 2014-02-12 (×6): 0.5 mg via INTRAVENOUS
  Filled 2014-02-07 (×6): qty 1

## 2014-02-07 MED ORDER — MIDAZOLAM HCL 2 MG/2ML IJ SOLN
INTRAMUSCULAR | Status: AC
  Filled 2014-02-07: qty 2

## 2014-02-07 MED ORDER — PROPOFOL 10 MG/ML IV BOLUS
INTRAVENOUS | Status: DC | PRN
  Administered 2014-02-07: 50 mg via INTRAVENOUS

## 2014-02-07 MED ORDER — MORPHINE SULFATE 2 MG/ML IJ SOLN
1.0000 mg | INTRAMUSCULAR | Status: AC | PRN
  Filled 2014-02-07: qty 1

## 2014-02-07 MED ORDER — ACETAMINOPHEN 160 MG/5ML PO SOLN: 650.0000 mg | Freq: Once | ORAL | Status: AC

## 2014-02-07 MED ORDER — MUPIROCIN 2 % EX OINT
1.0000 "application " | TOPICAL_OINTMENT | Freq: Two times a day (BID) | CUTANEOUS | Status: AC
  Administered 2014-02-08 – 2014-02-12 (×9): 1 via NASAL
  Filled 2014-02-07 (×2): qty 22

## 2014-02-07 MED ORDER — VANCOMYCIN HCL IN DEXTROSE 1-5 GM/200ML-% IV SOLN
1000.0000 mg | Freq: Once | INTRAVENOUS | Status: AC
  Administered 2014-02-07: 1000 mg via INTRAVENOUS
  Filled 2014-02-07: qty 200

## 2014-02-07 MED ORDER — MIDAZOLAM HCL 2 MG/2ML IJ SOLN
2.0000 mg | INTRAMUSCULAR | Status: DC | PRN
  Administered 2014-02-07 – 2014-02-08 (×5): 2 mg via INTRAVENOUS
  Filled 2014-02-07 (×5): qty 2

## 2014-02-07 MED ORDER — BUDESONIDE-FORMOTEROL FUMARATE 160-4.5 MCG/ACT IN AERO
2.0000 | INHALATION_SPRAY | Freq: Two times a day (BID) | RESPIRATORY_TRACT | Status: DC
  Administered 2014-02-08 – 2014-02-09 (×2): 2 via RESPIRATORY_TRACT
  Filled 2014-02-07: qty 6

## 2014-02-07 MED ORDER — DOCUSATE SODIUM 100 MG PO CAPS
200.0000 mg | ORAL_CAPSULE | Freq: Every day | ORAL | Status: DC
  Administered 2014-02-08 – 2014-02-13 (×6): 200 mg via ORAL
  Filled 2014-02-07 (×6): qty 2

## 2014-02-07 MED ORDER — PANTOPRAZOLE SODIUM 40 MG PO TBEC
40.0000 mg | DELAYED_RELEASE_TABLET | Freq: Every day | ORAL | Status: DC
  Administered 2014-02-09 – 2014-02-12 (×4): 40 mg via ORAL
  Filled 2014-02-07 (×5): qty 1

## 2014-02-07 MED ORDER — ASPIRIN 81 MG PO CHEW
324.0000 mg | CHEWABLE_TABLET | Freq: Every day | ORAL | Status: DC
Start: 2014-02-08 — End: 2014-02-13
  Administered 2014-02-09: 324 mg
  Filled 2014-02-07: qty 4

## 2014-02-07 MED ORDER — OXYCODONE HCL 5 MG PO TABS
5.0000 mg | ORAL_TABLET | ORAL | Status: DC | PRN
  Administered 2014-02-08: 10 mg via ORAL
  Administered 2014-02-08: 5 mg via ORAL
  Administered 2014-02-09: 10 mg via ORAL
  Administered 2014-02-09: 5 mg via ORAL
  Administered 2014-02-09 – 2014-02-11 (×7): 10 mg via ORAL
  Administered 2014-02-12: 5 mg via ORAL
  Administered 2014-02-13: 10 mg via ORAL
  Filled 2014-02-07 (×4): qty 2
  Filled 2014-02-07: qty 1
  Filled 2014-02-07 (×3): qty 2
  Filled 2014-02-07: qty 1
  Filled 2014-02-07: qty 2
  Filled 2014-02-07: qty 1
  Filled 2014-02-07 (×2): qty 2

## 2014-02-07 MED ORDER — SUCCINYLCHOLINE CHLORIDE 20 MG/ML IJ SOLN
INTRAMUSCULAR | Status: DC | PRN
  Administered 2014-02-07: 100 mg via INTRAVENOUS

## 2014-02-07 MED ORDER — FAMOTIDINE IN NACL 20-0.9 MG/50ML-% IV SOLN
20.0000 mg | Freq: Two times a day (BID) | INTRAVENOUS | Status: AC
  Administered 2014-02-07 (×2): 20 mg via INTRAVENOUS
  Filled 2014-02-07: qty 50

## 2014-02-07 MED ORDER — LORAZEPAM 0.5 MG PO TABS: 0.5000 mg | ORAL_TABLET | Freq: Two times a day (BID) | ORAL | Status: DC | PRN

## 2014-02-07 MED ORDER — SODIUM CHLORIDE 0.9 % IV SOLN: INTRAVENOUS | Status: DC

## 2014-02-07 MED ORDER — ASPIRIN EC 325 MG PO TBEC
325.0000 mg | DELAYED_RELEASE_TABLET | Freq: Every day | ORAL | Status: DC
  Administered 2014-02-08 – 2014-02-13 (×5): 325 mg via ORAL
  Filled 2014-02-07 (×6): qty 1

## 2014-02-07 MED ORDER — VECURONIUM BROMIDE 10 MG IV SOLR
INTRAVENOUS | Status: DC | PRN
  Administered 2014-02-07: 4 mg via INTRAVENOUS
  Administered 2014-02-07 (×2): 3 mg via INTRAVENOUS
  Administered 2014-02-07: 10 mg via INTRAVENOUS
  Administered 2014-02-07: 3 mg via INTRAVENOUS

## 2014-02-07 MED ORDER — METOPROLOL TARTRATE 12.5 MG HALF TABLET
12.5000 mg | ORAL_TABLET | Freq: Two times a day (BID) | ORAL | Status: DC
  Administered 2014-02-08: 12.5 mg via ORAL
  Filled 2014-02-07 (×5): qty 1

## 2014-02-07 MED ORDER — SODIUM CHLORIDE 0.9 % IV SOLN: 250.0000 mL | INTRAVENOUS | Status: DC

## 2014-02-07 MED ORDER — MIDAZOLAM HCL 5 MG/5ML IJ SOLN
INTRAMUSCULAR | Status: DC | PRN
  Administered 2014-02-07: 2 mg via INTRAVENOUS
  Administered 2014-02-07: 3 mg via INTRAVENOUS
  Administered 2014-02-07: 2 mg via INTRAVENOUS
  Administered 2014-02-07: 3 mg via INTRAVENOUS
  Administered 2014-02-07: 4 mg via INTRAVENOUS

## 2014-02-07 MED ORDER — ALBUMIN HUMAN 5 % IV SOLN
250.0000 mL | INTRAVENOUS | Status: AC | PRN
  Administered 2014-02-07: 250 mL via INTRAVENOUS

## 2014-02-07 MED ORDER — LACTATED RINGERS IV SOLN
INTRAVENOUS | Status: DC
  Administered 2014-02-07: 14:00:00 via INTRAVENOUS

## 2014-02-07 MED ORDER — DEXTROSE 5 % IV SOLN
1.5000 g | Freq: Two times a day (BID) | INTRAVENOUS | Status: AC
  Administered 2014-02-07 – 2014-02-09 (×4): 1.5 g via INTRAVENOUS
  Filled 2014-02-07 (×4): qty 1.5

## 2014-02-07 MED ORDER — LIDOCAINE HCL (CARDIAC) 20 MG/ML IV SOLN
INTRAVENOUS | Status: AC
  Filled 2014-02-07: qty 5

## 2014-02-07 MED ORDER — METHYLPREDNISOLONE SODIUM SUCC 125 MG IJ SOLR
125.0000 mg | Freq: Once | INTRAMUSCULAR | Status: AC
  Administered 2014-02-07: 125 mg via INTRAVENOUS
  Filled 2014-02-07 (×2): qty 2

## 2014-02-07 MED ORDER — POTASSIUM CHLORIDE 10 MEQ/50ML IV SOLN: 10.0000 meq | INTRAVENOUS | Status: AC

## 2014-02-07 MED ORDER — LACTATED RINGERS IV SOLN
INTRAVENOUS | Status: DC | PRN
  Administered 2014-02-07: 07:00:00 via INTRAVENOUS

## 2014-02-07 MED ORDER — SODIUM CHLORIDE 0.9 % IV SOLN
100.0000 [IU] | INTRAVENOUS | Status: DC | PRN
  Administered 2014-02-07: 1.3 [IU]/h via INTRAVENOUS

## 2014-02-07 MED ORDER — METOPROLOL TARTRATE 12.5 MG HALF TABLET
ORAL_TABLET | ORAL | Status: AC
  Filled 2014-02-07: qty 1

## 2014-02-07 MED ORDER — MAGNESIUM SULFATE 4000MG/100ML IJ SOLN
4.0000 g | Freq: Once | INTRAMUSCULAR | Status: AC
  Administered 2014-02-07: 4 g via INTRAVENOUS
  Filled 2014-02-07: qty 100

## 2014-02-07 MED ORDER — DEXMEDETOMIDINE HCL IN NACL 200 MCG/50ML IV SOLN
0.1000 ug/kg/h | INTRAVENOUS | Status: DC
  Administered 2014-02-07 – 2014-02-08 (×3): 0.7 ug/kg/h via INTRAVENOUS
  Filled 2014-02-07 (×4): qty 50

## 2014-02-07 MED ORDER — SODIUM CHLORIDE 0.9 % IV SOLN
INTRAVENOUS | Status: DC
  Filled 2014-02-07: qty 1

## 2014-02-07 MED ORDER — ONDANSETRON HCL 4 MG/2ML IJ SOLN: 4.0000 mg | Freq: Four times a day (QID) | INTRAMUSCULAR | Status: DC | PRN

## 2014-02-07 MED ORDER — SODIUM CHLORIDE 0.9 % IJ SOLN
3.0000 mL | Freq: Two times a day (BID) | INTRAMUSCULAR | Status: DC
  Administered 2014-02-08 – 2014-02-11 (×7): 3 mL via INTRAVENOUS

## 2014-02-07 MED ORDER — ACETAMINOPHEN 650 MG RE SUPP
650.0000 mg | Freq: Once | RECTAL | Status: AC
  Administered 2014-02-07: 650 mg via RECTAL

## 2014-02-07 MED ORDER — BISACODYL 5 MG PO TBEC
10.0000 mg | DELAYED_RELEASE_TABLET | Freq: Every day | ORAL | Status: DC
  Administered 2014-02-08 – 2014-02-11 (×4): 10 mg via ORAL
  Filled 2014-02-07 (×4): qty 2

## 2014-02-07 MED ORDER — INSULIN REGULAR BOLUS VIA INFUSION
0.0000 [IU] | Freq: Three times a day (TID) | INTRAVENOUS | Status: DC
  Filled 2014-02-07: qty 10

## 2014-02-07 MED ORDER — LACTATED RINGERS IV SOLN: 500.0000 mL | Freq: Once | INTRAVENOUS | Status: AC | PRN

## 2014-02-07 MED ORDER — DEXMEDETOMIDINE HCL IN NACL 200 MCG/50ML IV SOLN
INTRAVENOUS | Status: DC | PRN
  Administered 2014-02-07: 0.2 ug/kg/h via INTRAVENOUS

## 2014-02-07 SURGICAL SUPPLY — 83 items
ATTRACTOMAT 16X20 MAGNETIC DRP (DRAPES) ×4 IMPLANT
BAG DECANTER FOR FLEXI CONT (MISCELLANEOUS) ×4 IMPLANT
BANDAGE ELASTIC 4 VELCRO ST LF (GAUZE/BANDAGES/DRESSINGS) ×4 IMPLANT
BANDAGE ELASTIC 6 VELCRO ST LF (GAUZE/BANDAGES/DRESSINGS) ×4 IMPLANT
BANDAGE GAUZE ELAST BULKY 4 IN (GAUZE/BANDAGES/DRESSINGS) ×4 IMPLANT
BASKET HEART  (ORDER IN 25'S) (MISCELLANEOUS) ×1
BASKET HEART (ORDER IN 25'S) (MISCELLANEOUS) ×1
BASKET HEART (ORDER IN 25S) (MISCELLANEOUS) ×2 IMPLANT
BLADE STERNUM SYSTEM 6 (BLADE) ×4 IMPLANT
CANISTER SUCTION 2500CC (MISCELLANEOUS) ×4 IMPLANT
CANNULA EZ GLIDE AORTIC 21FR (CANNULA) ×4 IMPLANT
CARDIAC SUCTION (MISCELLANEOUS) ×4 IMPLANT
CATH CPB KIT HENDRICKSON (MISCELLANEOUS) ×4 IMPLANT
CATH ROBINSON RED A/P 18FR (CATHETERS) ×4 IMPLANT
CATH THORACIC 36FR (CATHETERS) ×4 IMPLANT
CATH THORACIC 36FR RT ANG (CATHETERS) ×4 IMPLANT
CLIP TI MEDIUM 24 (CLIP) IMPLANT
CLIP TI WIDE RED SMALL 24 (CLIP) ×12 IMPLANT
CONT SPECI 4OZ STER CLIK (MISCELLANEOUS) ×4 IMPLANT
COVER SURGICAL LIGHT HANDLE (MISCELLANEOUS) ×4 IMPLANT
CRADLE DONUT ADULT HEAD (MISCELLANEOUS) ×4 IMPLANT
DRAPE CARDIOVASCULAR INCISE (DRAPES) ×2
DRAPE SLUSH/WARMER DISC (DRAPES) ×4 IMPLANT
DRAPE SRG 135X102X78XABS (DRAPES) ×2 IMPLANT
DRSG COVADERM 4X14 (GAUZE/BANDAGES/DRESSINGS) ×4 IMPLANT
ELECT REM PT RETURN 9FT ADLT (ELECTROSURGICAL) ×8
ELECTRODE REM PT RTRN 9FT ADLT (ELECTROSURGICAL) ×4 IMPLANT
GLOVE EUDERMIC 7 POWDERFREE (GLOVE) ×12 IMPLANT
GOWN STRL REUS W/ TWL LRG LVL3 (GOWN DISPOSABLE) ×8 IMPLANT
GOWN STRL REUS W/ TWL XL LVL3 (GOWN DISPOSABLE) ×4 IMPLANT
GOWN STRL REUS W/TWL LRG LVL3 (GOWN DISPOSABLE) ×8
GOWN STRL REUS W/TWL XL LVL3 (GOWN DISPOSABLE) ×4
HEMOSTAT POWDER SURGIFOAM 1G (HEMOSTASIS) ×12 IMPLANT
HEMOSTAT SURGICEL 2X14 (HEMOSTASIS) ×4 IMPLANT
INSERT FOGARTY XLG (MISCELLANEOUS) IMPLANT
KIT BASIN OR (CUSTOM PROCEDURE TRAY) ×4 IMPLANT
KIT ROOM TURNOVER OR (KITS) ×4 IMPLANT
KIT SUCTION CATH 14FR (SUCTIONS) ×8 IMPLANT
KIT VASOVIEW W/TROCAR VH 2000 (KITS) ×4 IMPLANT
MARKER GRAFT CORONARY BYPASS (MISCELLANEOUS) ×12 IMPLANT
NS IRRIG 1000ML POUR BTL (IV SOLUTION) ×20 IMPLANT
PACK OPEN HEART (CUSTOM PROCEDURE TRAY) ×4 IMPLANT
PAD ARMBOARD 7.5X6 YLW CONV (MISCELLANEOUS) ×8 IMPLANT
PAD ELECT DEFIB RADIOL ZOLL (MISCELLANEOUS) ×4 IMPLANT
PENCIL BUTTON HOLSTER BLD 10FT (ELECTRODE) ×4 IMPLANT
PUNCH AORTIC ROTATE 4.0MM (MISCELLANEOUS) IMPLANT
PUNCH AORTIC ROTATE 4.5MM 8IN (MISCELLANEOUS) ×4 IMPLANT
PUNCH AORTIC ROTATE 5MM 8IN (MISCELLANEOUS) IMPLANT
SET CARDIOPLEGIA MPS 5001102 (MISCELLANEOUS) ×4 IMPLANT
SPONGE GAUZE 4X4 12PLY (GAUZE/BANDAGES/DRESSINGS) ×8 IMPLANT
SPONGE GAUZE 4X4 12PLY STER LF (GAUZE/BANDAGES/DRESSINGS) ×8 IMPLANT
SPONGE LAP 4X18 X RAY DECT (DISPOSABLE) ×4 IMPLANT
SUT BONE WAX W31G (SUTURE) ×4 IMPLANT
SUT ETHIBOND NAB MH 2-0 36IN (SUTURE) ×4 IMPLANT
SUT MNCRL AB 4-0 PS2 18 (SUTURE) IMPLANT
SUT PROLENE 3 0 SH DA (SUTURE) ×4 IMPLANT
SUT PROLENE 4 0 RB 1 (SUTURE) ×2
SUT PROLENE 4 0 SH DA (SUTURE) IMPLANT
SUT PROLENE 4-0 RB1 .5 CRCL 36 (SUTURE) ×2 IMPLANT
SUT PROLENE 6 0 C 1 30 (SUTURE) ×8 IMPLANT
SUT PROLENE 7 0 BV1 MDA (SUTURE) ×4 IMPLANT
SUT PROLENE 8 0 BV175 6 (SUTURE) ×12 IMPLANT
SUT STEEL 6MS V (SUTURE) ×4 IMPLANT
SUT STEEL STERNAL CCS#1 18IN (SUTURE) IMPLANT
SUT STEEL SZ 6 DBL 3X14 BALL (SUTURE) ×4 IMPLANT
SUT VIC AB 1 CTX 36 (SUTURE) ×4
SUT VIC AB 1 CTX36XBRD ANBCTR (SUTURE) ×4 IMPLANT
SUT VIC AB 2-0 CT1 27 (SUTURE) ×2
SUT VIC AB 2-0 CT1 TAPERPNT 27 (SUTURE) ×2 IMPLANT
SUT VIC AB 2-0 CTX 27 (SUTURE) IMPLANT
SUT VIC AB 3-0 SH 27 (SUTURE)
SUT VIC AB 3-0 SH 27X BRD (SUTURE) IMPLANT
SUT VIC AB 3-0 X1 27 (SUTURE) ×4 IMPLANT
SUT VICRYL 4-0 PS2 18IN ABS (SUTURE) IMPLANT
SUTURE E-PAK OPEN HEART (SUTURE) ×4 IMPLANT
SYSTEM SAHARA CHEST DRAIN ATS (WOUND CARE) ×4 IMPLANT
TOWEL OR 17X24 6PK STRL BLUE (TOWEL DISPOSABLE) ×8 IMPLANT
TOWEL OR 17X26 10 PK STRL BLUE (TOWEL DISPOSABLE) ×8 IMPLANT
TRAY FOLEY IC TEMP SENS 16FR (CATHETERS) ×4 IMPLANT
TUBE FEEDING 8FR 16IN STR KANG (MISCELLANEOUS) ×4 IMPLANT
TUBING INSUFFLATION 10FT LAP (TUBING) ×4 IMPLANT
UNDERPAD 30X30 INCONTINENT (UNDERPADS AND DIAPERS) ×4 IMPLANT
WATER STERILE IRR 1000ML POUR (IV SOLUTION) ×8 IMPLANT

## 2014-02-07 NOTE — Progress Notes (Signed)
Has been stable since reintubated He is still wheezing Will give solumedrol to see if it helps

## 2014-02-07 NOTE — Anesthesia Preprocedure Evaluation (Addendum)
Anesthesia Evaluation  Patient identified by MRN, date of birth, ID band Patient awake    Reviewed: Allergy & Precautions, H&P , NPO status , Patient's Chart, lab work & pertinent test results, reviewed documented beta blocker date and time   Airway Mallampati: III TM Distance: >3 FB Neck ROM: Full    Dental no notable dental hx. (+) Teeth Intact, Dental Advisory Given   Pulmonary neg pulmonary ROS, asthma , former smoker,  breath sounds clear to auscultation  Pulmonary exam normal       Cardiovascular hypertension, On Medications + CAD and + Peripheral Vascular Disease Rhythm:Regular Rate:Normal     Neuro/Psych  Headaches, negative psych ROS   GI/Hepatic negative GI ROS, Neg liver ROS,   Endo/Other  negative endocrine ROS  Renal/GU negative Renal ROS  negative genitourinary   Musculoskeletal   Abdominal   Peds  Hematology negative hematology ROS (+)   Anesthesia Other Findings   Reproductive/Obstetrics negative OB ROS                         Anesthesia Physical Anesthesia Plan  ASA: IV  Anesthesia Plan: General   Post-op Pain Management:    Induction: Intravenous  Airway Management Planned: Oral ETT  Additional Equipment: Arterial line, PA Cath, CVP, TEE and Ultrasound Guidance Line Placement  Intra-op Plan:   Post-operative Plan: Post-operative intubation/ventilation  Informed Consent: I have reviewed the patients History and Physical, chart, labs and discussed the procedure including the risks, benefits and alternatives for the proposed anesthesia with the patient or authorized representative who has indicated his/her understanding and acceptance.   Dental advisory given  Plan Discussed with: CRNA, Anesthesiologist and Surgeon  Anesthesia Plan Comments:        Anesthesia Quick Evaluation

## 2014-02-07 NOTE — Progress Notes (Signed)
Utilization Review Completed.Donne Anon T7/30/2015

## 2014-02-07 NOTE — H&P (View-Only) (Signed)
PCP is [name], MD Referring Provider is Josue Hector, MD  Chief Complaint  Patient presents with  . Coronary Artery Disease    Surgical eval, Cardiac Cath 02/01/14    HPI: Carl Lee is a 58 year old gentleman sent for consultation regarding severe three-vessel coronary disease.  Carl Lee is a 58 year old gentleman with a history of hyperlipidemia, hypercholesterolemia, tobacco abuse and a positive family history for coronary disease. He also has a history of bladder cancer. He had a transurethral resection in 2010. He recently was diagnosed with a recurrence. During his preoperative evaluation he was noted to have an abnormal EKG. A repeat EKG showed the same Q waves in V1 and 2. He was referred for cardiology evaluation. Given his history of abdominal aortic aneurysm and carotid disease and multiple cardiac risk factors a stress Myoview was done. The Myoview showed a large fixed defect involving the entire inferior and anteroseptum and mid and distal anterior wall and distal inferior wall and apex with no ischemia. He was referred for cardiac catheterization.  Dr. Angelena Form performed cardiac catheterization on 02/01/2014. That revealed total occlusion of his LAD and right coronary and an 80% stenosis and a circumflex giving rise to 3 large obtuse marginal branches. Ejection fraction was 45-50%.  Carl Lee denies any chest pain, pressure or tightness with exertion. He has been experiencing some pain in his right jaw over the past couple of months. This is not consistently exertional and is unclear if this represents an anginal equivalent. 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    Past Medical History  Diagnosis Date  . Bladder cancer 08/16/2008    [facility] then [place] now [place])  . Hypertension   . AAA (abdominal aortic aneurysm) 03/25/12    s/p endovascular repair  . Hx of  migraines   . Contact dermatitis     atypical [name])  . Arthritis   . Childhood asthma   . Environmental allergies     improved as ages  . Diverticulosis 2013    severe by CT and colonoscopy  . Adenomatous polyp 06/2009  . Lumbosacral radiculopathy at S1 03/2012    left, with spinal stenosis (MRI 04/2013) improved with TF ESI L5/S1 and S1/2 [name])    Past Surgical History  Procedure Laterality Date  . Tonsillectomy and adenoidectomy  1973  . Vasectomy    . Bladder cancer      [facility])  . Abdominal aortic aneurysm repair  03/25/12  . Colonoscopy  06/2012    hyperplastic polyp, diverticulosis (jacobs) rec rpt 5 yrs  . Cystoscopy  08/16/08    Bladder Cancer  . Esi  04/2013, 06/2013    L L5S1, S12 transforaminal ESI (Dr.  [name])  . Abi  05/2013    WNL, L TBI low at 0.66    Family History  Problem Relation Age of Onset  . Diabetes Mother   . Arrhythmia Mother     pacemaker  . Cancer Mother     Bladder  . COPD Mother   . Thyroid disease Mother   . Hyperlipidemia Mother   . Hypertension Mother   . Diabetes Father   . CAD Father 66    CHF, MI  . Dementia Father   . Diabetes Sister   . Colon cancer Neg Hx   . Diabetes Brother   . Hypertension Brother  Social History History  Substance Use Topics  . Smoking status: Current Every Day Smoker -- 0.50 packs/day for 30 years    Types: Cigarettes  . Smokeless tobacco: Never Used     Comment: pt states that he is trying to quit  . Alcohol Use: Yes     Comment: Rare    Current Outpatient Prescriptions  Medication Sig Dispense Refill  . acetaminophen (TYLENOL) 500 MG tablet Take 500 mg by mouth every 6 (six) hours as needed for pain.      [name] aspirin 81 MG tablet Take 81 mg by mouth daily.      . Glucosamine-Chondroit-Vit C-Mn (GLUCOSAMINE 1500 COMPLEX PO) Take 2 tablets by mouth daily.      [name] lisinopril-hydrochlorothiazide (PRINZIDE,ZESTORETIC) 20-25 MG per tablet Take 1 tablet by mouth daily.  90  tablet  3  . LORazepam (ATIVAN) 0.5 MG tablet Take 1 tablet (0.5 mg total) by mouth 2 (two) times daily as needed for anxiety.  20 tablet  0  . Multiple Vitamins-Minerals (CENTRUM SILVER ADULT 50+ PO) Take 1 capsule by mouth daily.       No current facility-administered medications for this visit.    Allergies  Allergen Reactions  . Codeine Other (See Comments)    Nightmares    Review of Systems  Constitutional: Positive for fatigue. Negative for unexpected weight change.  Respiratory: Positive for cough. Negative for wheezing.   Cardiovascular:       Jaw pain- not consistently exertional  Genitourinary:       Bladder tumor, kidney stones  Musculoskeletal: Positive for arthralgias.  Skin: Positive for rash.  Psychiatric/Behavioral: The patient is nervous/anxious.   All other systems reviewed and are negative.   BP 115/78  Pulse 92  Resp 20  Ht 5\' 11"  (1.803 m)  Wt 195 lb (88.451 kg)  BMI 27.21 kg/m2  SpO2 95% Physical Exam  Vitals reviewed. Constitutional: He is oriented to person, place, and time. He appears well-developed and well-nourished. No distress.  HENT:  Head: Normocephalic and atraumatic.  Eyes: EOM are normal. Pupils are equal, round, and reactive to light.  Neck: Neck supple. No thyromegaly present.  No carotid bruit  Cardiovascular: Normal rate, regular rhythm, normal heart sounds and intact distal pulses.  Exam reveals no friction rub.   No murmur heard. Pulmonary/Chest: Effort normal and breath sounds normal. He has no wheezes. He has no rales.  Abdominal: Soft. There is no tenderness.  Musculoskeletal: He exhibits no edema.  Lymphadenopathy:    He has no cervical adenopathy.  Neurological: He is alert and oriented to person, place, and time. No cranial nerve deficit.  Skin: Skin is warm and dry.     Diagnostic Tests: Cardiac catheterization Hemodynamic Findings:  Central aortic pressure: 113/67  Left ventricular pressure: 120/8/16   Angiographic Findings:  Left main: No obstructive disease.  Left Anterior Descending Artery: Large caliber vessel that courses to the apex. The proximal vessel has diffuse 30% stenosis followed by 100% occlusion after a large septal perforating branch. The mid and distal vessel fills from left to left collaterals.  Circumflex Artery: Large co-dominant vessel with three moderate caliber obtuse marginal branches. The proximal vessel has a focal 80% stenosis followed by an aneurysmal segment. The mid vessel has a 50% stenosis just before the takeoff of the first obtuse marginal branch. All three of the marginal branches are patent with minimal plaque disease.  Right Coronary Artery: Small to moderate caliber co-dominant vessel with 100% proximal occlusion. There  is a moderate caliber RV marginal branch that arises proximal to the occlusion of the proximal vessel. There appears to be filling of the small caliber distal RCA from right to right and left to right collaterals.  Left Ventricular Angiogram: LVEF=50% with subtle hypokinesis of the anterior wall with abnormal appearance at the LV apex, possible LV apical aneurysm.  Impression:  1. Triple vessel CAD with chronic total occlusion of the LAD and RCA, high grade disease in the proximal Circumflex.  2. Low normal LV systolic function  Recommendations: He is having no active chest pain or signs of unstable angina. Would proceed with the office based laser bladder procedure. I have discussed with Dr. Johnsie Lee. Will refer to see CT surgery as an outpatient for CABG.    Impression: 58 year old gentleman with multiple cardiac risk factors who has critical three-vessel disease and moderate left ventricular dysfunction. Coronary bypass grafting is indicated for survival benefit. Release of symptoms is a little less certain in his case, since he is not having classic anginal symptoms.  I discussed the general nature of the procedure, the need for general  anesthesia, and the incisions to be used with Carl Lee. We discussed the expected hospital stay, overall recovery and short and long term outcomes. We discussed the indications, risks, benefits, and alternatives. They understand the risks include, but are not limited to death, stroke, MI, DVT/PE, bleeding, possible need for transfusion, infections, cardiac arrhythmias, and other organ system dysfunction including respiratory, renal, or GI complications.   He wishes to proceed as soon as possible.  Plan:  Coronary artery bypass grafting with bilateral internal mammary arteries and endoscopic vein harvest on Thursday, July 30

## 2014-02-07 NOTE — Op Note (Signed)
NAMELOUKAS, ANTONSON NO.:  [id number]  MEDICAL RECORD NO.:  [id number]  LOCATION:  2S07C                        FACILITY:  [facility]  PHYSICIAN:  Revonda Standard. [name], M.D.DATE OF BIRTH:  11-13-1955  DATE OF PROCEDURE:  02/07/2014 DATE OF DISCHARGE:                              OPERATIVE REPORT   PREOPERATIVE DIAGNOSIS:  Severe 3-vessel coronary disease.  POSTOPERATIVE DIAGNOSIS:  Severe 3-vessel coronary disease.  PROCEDURE:  Median sternotomy, extracorporeal circulation, coronary artery bypass grafting x3 (left internal mammary artery to LAD, right internal mammary artery to obtuse marginal via the transverse sinus, saphenous vein graft to posterior descending), endoscopic vein harvest, right thigh.  SURGEON:  Revonda Standard. [name], M.D.  ASSISTANTS:  John Giovanni, P.A.-C.  ANESTHESIA:  General.  FINDINGS:  LAD 1.5 mm, fair quality target.  Posterior descending 1 mm, poor-quality target.  OM 2 good quality target.  Mammaries and saphenous vein good quality.  Transesophageal echocardiography revealed good left ventricular function with no significant valvular pathology.  CLINICAL NOTE:  Mr. Vanauken is a 58 year old gentleman with multiple cardiac risk factors.  He recently was diagnosed with a recurrent bladder tumor.  During his preoperative evaluation, he was noted to have an abnormal EKG.  A stress Myoview was done which showed a large fixed defect involving the inferior and anteroseptal walls, mid and distal anterior wall, and distal inferior wall and apex.  He underwent cardiac catheterization, which revealed total occlusion of his LAD and right coronary and an 80% stenosis in the large circumflex giving rise to 3 large obtuse marginal branches.  Ejection fraction was approximately 50%.  He was advised to undergo coronary artery bypass grafting for survival benefit.  The indications, risks, benefits, and alternatives were discussed in detail with the  patient.  He understood and accepted the risks and agreed to proceed.  OPERATIVE NOTE:  Mr. Starace was brought to the preoperative holding area on February 07, 2014, there Anesthesia placed a Swan-Ganz catheter and arterial blood pressure monitoring line.  Intravenous antibiotics were administered.  He was taken to the operating room, anesthetized, and intubated.  A Foley catheter was placed.  Transesophageal echocardiography was performed.  It revealed overall relatively well- preserved left ventricular function with an EF of approximately 50%, and no valvular pathology.  A median sternotomy was performed.  Simultaneously, an incision was made in the medial aspect of the right leg at the level of the knee.  The greater saphenous vein was harvested endoscopically from the right thigh.  The left internal mammary artery was harvested using standard technique.  5000 units of heparin was administered prior to dividing the distal end of the mammary artery.  The mammary artery was a good quality vessel with excellent flow when divided distally.  Next, the right internal mammary artery was harvested, again using standard technique.  It likewise turned out to be 1.5 mm good quality conduit with excellent flow when divided distally.  The remainder of the full heparin dose was given.  The pericardium was opened after confirming adequate anticoagulation with ACT measurement.  The aorta was cannulated via concentric 2-0 Ethibond pledgeted pursestring sutures.  A dual-stage venous cannula was  placed via a pursestring suture in the right atrial appendage. Cardiopulmonary bypass was instituted, and the patient was cooled to 32 degrees Celsius.  The coronary arteries were inspected and anastomotic sites were chosen.  Posterior descending was very small.  It was elected to graft this vessel as there was total occlusion of the right coronary. The conduits were inspected and cut to length.  The right  mammary did reach the second obtuse marginal target via the transverse sinus without tension.  Two fascial releases were performed on the right mammary pedicle to ensure that there was no tension on the graft.  A foam pad was placed in the pericardium to insulate the heart and protect the left phrenic nerve.  A temperature probe was placed in myocardial septum.  A cardioplegia cannula was placed in the ascending aorta.  The aorta was cross clamped.  The left ventricle was emptied via aortic root vent.  Cardiac arrest then was achieved with a combination of cold antegrade blood cardioplegia and topical iced saline. 1 L cardioplegia was administered.  There was a rapid diastolic arrest and myocardial septal cooling to 10 degrees Celsius. The distal anastomoses were performed.  First, a reversed saphenous vein graft was placed end-to-side to the posterior descending branch of the right coronary, this was a 1 mm poor quality target.  The vein was good quality.  It was anastomosed end-to- side with running 7-0 Prolene suture.  The probe did pass from the anastomosis proximally and distally without resistance at the completion of the anastomosis.  Cardioplegia was administered down the graft, and there was satisfactory flow and good hemostasis.  Next, the right internal mammary artery was brought through an incision on the right side of the pericardium and then through the transverse sinus.  The distal end was beveled.  It was anastomosed end-to-side to obtuse marginal 2. OM2 was a 1.5 mm good quality target.  The disease in the circumflex was proximal to the takeoff of the 3 obtuse marginal branches, none of which had significant disease.  The right mammary to OM 2 anastomosis was performed with a running 8-0 Prolene suture in an end-to-side fashion.  At the completion of the anastomosis, the bulldog clamp was briefly removed.  There was good flash in the distal vessel and  good hemostasis.  The bulldog clamp was replaced and the mammary pedicle was tacked to the epicardial surface of the heart with 6-0 Prolene sutures.  Additional cardioplegia was administered down the aortic root and the vein graft.  Next, the left internal mammary artery was brought through a window in the pericardium.  The distal end was beveled.  It was then anastomosed end-to-side to the distal LAD.  The LAD was a diffusely diseased vessel but a 1.5 mm probe did pass proximally as well as distally to the apex.  An end-to-side anastomosis was performed with a running 8- 0 Prolene suture.  At the completion the mammary to LAD anastomosis, the bulldog clamp was briefly removed.  Rapid septal rewarming was noted.  The bulldog clamp was replaced, and the mammary pedicle was tacked to the epicardial surface of the heart with 6-0 Prolene sutures.  The vein graft was cut to length.  The cardioplegia cannula was removed from the ascending aorta.  The proximal vein graft anastomosis was performed to a 4.5 mm punch aortotomy with running 6-0 Prolene suture. At the completion of the proximal anastomosis, the patient was placed in Trendelenburg position.  Lidocaine was administered.  The  aortic root was de-aired.  The bulldog clamps were removed from both mammary arteries.  After de-airing the aortic root, the aortic crossclamp was removed.  Total crossclamp time was 72 minutes.  The patient initially fibrillated and required single defibrillation with 10 joules and then was in sinus rhythm thereafter.  While rewarming was completed, all proximal and distal anastomoses were inspected for hemostasis.  Epicardial pacing wires were placed on the right ventricle and right atrium.  When the patient had reached a core temperature of 37 degrees Celsius, he was weaned from cardiopulmonary bypass on the first attempt without difficulty.  The total bypass time was 180 minutes.  The initial cardiac index  was greater than 2 liters/minute/meter squared.  The patient remained hemodynamically stable throughout the postbypass period.  Transesophageal echocardiography was essentially unchanged, with the possible exception of some improved wall motion in the myocardial septum.  A test dose protamine was administered and was well tolerated.  The atrial and aortic cannulae were removed.  The remainder of protamine was administered without incident.  Chest was irrigated with warm saline. Hemostasis was achieved.  The pericardium was reapproximated over the aortic cannulation site as well as base of the heart with interrupted 3- 0 silk sutures.  Bilateral pleural tubes and a single mediastinal chest tube were placed through separate subcostal incisions and secured with #1 silk sutures.  The sternum was closed with a combination of single and double heavy gauge stainless steel wires.  The patient tolerated the sternal closure well hemodynamically without any significant changes. The pectoralis fascia, subcutaneous tissue, and skin were closed in standard fashion.  All sponge, needle, and instrument counts were correct at the end of the procedure.  The patient was taken from the operating room to the surgical intensive care unit, intubated and in good condition.     Revonda Standard [name], M.D.     SCH/MEDQ  D:  02/07/2014  T:  02/07/2014  Job:  [id number]

## 2014-02-07 NOTE — Transfer of Care (Signed)
Immediate Anesthesia Transfer of Care Note  Patient: Carl Lee  Procedure(s) Performed: Procedure(s) with comments: CORONARY ARTERY BYPASS GRAFTING (CABG) (N/A) - CABG X 3, BILATERAL LIMA, EVH INTRAOPERATIVE TRANSESOPHAGEAL ECHOCARDIOGRAM (N/A)  Patient Location: ICU  Anesthesia Type:General  Level of Consciousness: unresponsive and Patient remains intubated per anesthesia plan  Airway & Oxygen Therapy: Patient remains intubated per anesthesia plan and Patient placed on Ventilator (see vital sign flow sheet for setting)  Post-op Assessment: Report given to PACU RN and Post -op Vital signs reviewed and stable  Post vital signs: Reviewed and stable  Complications: No apparent anesthesia complications

## 2014-02-07 NOTE — Anesthesia Procedure Notes (Addendum)
Procedure Name: Intubation Date/Time: 02/07/2014 8:06 AM Performed by: Melina Copa, Mckinsey Keagle R Pre-anesthesia Checklist: Patient identified, Emergency Drugs available, Suction available, Patient being monitored and Timeout performed Patient Re-evaluated:Patient Re-evaluated prior to inductionOxygen Delivery Method: Circle system utilized Preoxygenation: Pre-oxygenation with 100% oxygen Intubation Type: IV induction Ventilation: Mask ventilation without difficulty Laryngoscope Size: Mac and 4 Grade View: Grade II Tube type: Oral Tube size: 8.5 mm Number of attempts: 1 Airway Equipment and Method: Stylet Placement Confirmation: ETT inserted through vocal cords under direct vision,  positive ETCO2 and breath sounds checked- equal and bilateral Secured at: 22 cm Tube secured with: Tape Dental Injury: Teeth and Oropharynx as per pre-operative assessment

## 2014-02-07 NOTE — Progress Notes (Signed)
Carl Lee was extubated earlier this afternoon with a marginal blood gas but acceptable mechanics and being alert and cooperative.  Since extubation he has gotten progressively more hypoxic and has been unable to cooperate with attempts to improve his breathing. He is currently on BIPAP and sats are 97% but he has poor mechanics and rhonchi and wheezing bilaterally. In addition his PA pressures have gone up considerably. He needs to be re-intubated for progressive acute on chronic respiratory failure.  I have called anesthesia to intubate him now semi-electively before we get into more distress.

## 2014-02-07 NOTE — Progress Notes (Signed)
CT surgery p.m. Rounds  Status post multivessel CABG with bilateral IMA Patient extubated postoperatively but required reintubation for thick secretions, tachypnea, and hypercapnia Currently resting comfortably on ventilator Bilateral coarse breath sounds-bronchodilators ordered We'll leave on ventilator until early a.m. to attempt to wean from vent again P.m. labs reviewed

## 2014-02-07 NOTE — Procedures (Signed)
Extubation Procedure Note  Patient Details:   Name: Carl Lee DOB: 02-Mar-1956 MRN: [id number]   Pt extubated to 4L [place] per protocol, VS WNL, pt able to vocalize, RT to monitor.    Evaluation  O2 sats: stable throughout Complications: No apparent complications Patient did tolerate procedure well. Bilateral Breath Sounds: Diminished Suctioning: Airway Yes  Jetty Peeks 02/07/2014, 4:17 PM

## 2014-02-07 NOTE — Interval H&P Note (Signed)
History and Physical Interval Note:  02/07/2014 7:46 AM  Carl Lee  has presented today for surgery, with the diagnosis of CAD  The various methods of treatment have been discussed with the patient and family. After consideration of risks, benefits and other options for treatment, the patient has consented to  Procedure(s) with comments: CORONARY ARTERY BYPASS GRAFTING (CABG) (N/A) - CABG X 3, BILATERAL LIMA, EVH INTRAOPERATIVE TRANSESOPHAGEAL ECHOCARDIOGRAM (N/A) as a surgical intervention .  The patient's history has been reviewed, patient examined, no change in status, stable for surgery.  I have reviewed the patient's chart and labs.  Questions were answered to the patient's satisfaction.     [name] C

## 2014-02-07 NOTE — Anesthesia Postprocedure Evaluation (Signed)
  Anesthesia Post-op Note  Patient: Carl Lee  Procedure(s) Performed: Procedure(s) with comments: CORONARY ARTERY BYPASS GRAFTING (CABG) (N/A) - CABG X 3, BILATERAL LIMA, EVH INTRAOPERATIVE TRANSESOPHAGEAL ECHOCARDIOGRAM (N/A)  Patient Location: ICU  Anesthesia Type:General  Level of Consciousness: sedated and unresponsive  Airway and Oxygen Therapy: Patient remains intubated and on ventilator  Post-op Pain: none  Post-op Assessment: Post-op Vital signs reviewed, Patient's Cardiovascular Status Stable and Respiratory Function Stable  Post-op Vital Signs: Reviewed  Filed Vitals:   02/07/14 1345  BP: 105/48  Pulse: 92  Temp:   Resp: 12    Complications: No apparent anesthesia complications

## 2014-02-07 NOTE — Brief Op Note (Addendum)
      [address]       [place] [address]             [phone]     02/07/2014  12:23 PM  PATIENT:  Carl Lee  58 y.o. male  PRE-OPERATIVE DIAGNOSIS:  CAD  POST-OPERATIVE DIAGNOSIS:CAD  PROCEDURE:  Procedure(s): CORONARY ARTERY BYPASS GRAFTING (CABG)X3   LIMA-LAD  RIMA-OM2  SVG-RPD EVH RIGHT THIGH INTRAOPERATIVE TRANSESOPHAGEAL ECHOCARDIOGRAM  SURGEON:  Surgeon(s): [name], MD  PHYSICIAN ASSISTANT: WAYNE GOLD PA-C  ANESTHESIA:   general  PATIENT CONDITION:  ICU - intubated and hemodynamically stable.  PRE-OPERATIVE WEIGHT: 72IO  COMPLICATIONS: NO KNOWN  XC= 72 min CPB= 108 min  PDA small poor quality target OM2 good quality LAD diffusely diseased fair quality

## 2014-02-08 ENCOUNTER — Encounter (HOSPITAL_COMMUNITY): Payer: Self-pay | Admitting: Thoracic Surgery (Cardiothoracic Vascular Surgery)

## 2014-02-08 ENCOUNTER — Inpatient Hospital Stay (HOSPITAL_COMMUNITY): Payer: BC Managed Care – PPO

## 2014-02-08 DIAGNOSIS — I209 Angina pectoris, unspecified: Secondary | ICD-10-CM

## 2014-02-08 DIAGNOSIS — I200 Unstable angina: Secondary | ICD-10-CM

## 2014-02-08 DIAGNOSIS — I2510 Atherosclerotic heart disease of native coronary artery without angina pectoris: Principal | ICD-10-CM

## 2014-02-08 LAB — BASIC METABOLIC PANEL
ANION GAP: 13 (ref 5–15)
BUN: 19 mg/dL (ref 6–23)
CALCIUM: 7.8 mg/dL — AB (ref 8.4–10.5)
CO2: 23 meq/L (ref 19–32)
CREATININE: 0.53 mg/dL (ref 0.50–1.35)
Chloride: 102 mEq/L (ref 96–112)
GFR calc non Af Amer: >90 mL/min (ref >90)
Glucose, Bld: 167 mg/dL — ABNORMAL HIGH (ref 70–99)
Potassium: 4 mEq/L (ref 3.7–5.3)
Sodium: 138 mEq/L (ref 137–147)

## 2014-02-08 LAB — GLUCOSE, CAPILLARY
GLUCOSE-CAPILLARY: 117 mg/dL — AB (ref 70–99)
GLUCOSE-CAPILLARY: 128 mg/dL — AB (ref 70–99)
GLUCOSE-CAPILLARY: 140 mg/dL — AB (ref 70–99)
GLUCOSE-CAPILLARY: 166 mg/dL — AB (ref 70–99)
GLUCOSE-CAPILLARY: 167 mg/dL — AB (ref 70–99)
Glucose-Capillary: 122 mg/dL — ABNORMAL HIGH (ref 70–99)

## 2014-02-08 LAB — POCT I-STAT 3, ART BLOOD GAS (G3+)
Bicarbonate: 24.7 mEq/L — ABNORMAL HIGH (ref 20.0–24.0)
Bicarbonate: 25.3 mEq/L — ABNORMAL HIGH (ref 20.0–24.0)
O2 SAT: 92 %
O2 SAT: 89 %
PH ART: 7.399 (ref 7.350–7.450)
Patient temperature: 37.2
TCO2: 26 mmol/L (ref 0–100)
TCO2: 27 mmol/L (ref 0–100)
pCO2 arterial: 41.6 mmHg (ref 35.0–45.0)
pCO2 arterial: 41 mmHg (ref 35.0–45.0)
pH, Arterial: 7.385 (ref 7.350–7.450)
pO2, Arterial: 66 mmHg — ABNORMAL LOW (ref 80.0–100.0)
pO2, Arterial: 58 mmHg — ABNORMAL LOW (ref 80.0–100.0)

## 2014-02-08 LAB — CBC
HCT: 38.1 % — ABNORMAL LOW (ref 39.0–52.0)
Hemoglobin: 12.8 g/dL — ABNORMAL LOW (ref 13.0–17.0)
MCH: 31.6 pg (ref 26.0–34.0)
MCHC: 33.6 g/dL (ref 30.0–36.0)
MCV: 94.1 fL (ref 78.0–100.0)
Platelets: 203 10*3/uL (ref 150–400)
RBC: 4.05 MIL/uL — ABNORMAL LOW (ref 4.22–5.81)
RDW: 13.3 % (ref 11.5–15.5)
WBC: 20.2 10*3/uL — ABNORMAL HIGH (ref 4.0–10.5)

## 2014-02-08 LAB — MAGNESIUM: Magnesium: 2.4 mg/dL (ref 1.5–2.5)

## 2014-02-08 MED ORDER — INSULIN DETEMIR 100 UNIT/ML ~~LOC~~ SOLN
10.0000 [IU] | Freq: Every day | SUBCUTANEOUS | Status: DC
Start: 2014-02-09 — End: 2014-02-11
  Administered 2014-02-09 – 2014-02-11 (×3): 10 [IU] via SUBCUTANEOUS
  Filled 2014-02-08 (×3): qty 0.1

## 2014-02-08 MED ORDER — FUROSEMIDE 10 MG/ML IJ SOLN
20.0000 mg | Freq: Two times a day (BID) | INTRAMUSCULAR | Status: DC
  Administered 2014-02-08 – 2014-02-10 (×5): 20 mg via INTRAVENOUS
  Filled 2014-02-08 (×8): qty 2

## 2014-02-08 MED ORDER — INSULIN DETEMIR 100 UNIT/ML ~~LOC~~ SOLN
20.0000 [IU] | Freq: Once | SUBCUTANEOUS | Status: AC
  Administered 2014-02-08: 20 [IU] via SUBCUTANEOUS
  Filled 2014-02-08: qty 0.2

## 2014-02-08 MED ORDER — METHYLPREDNISOLONE SODIUM SUCC 125 MG IJ SOLR
60.0000 mg | Freq: Two times a day (BID) | INTRAMUSCULAR | Status: DC
  Administered 2014-02-08 – 2014-02-09 (×3): 60 mg via INTRAVENOUS
  Filled 2014-02-08: qty 2
  Filled 2014-02-08 (×4): qty 0.96

## 2014-02-08 MED ORDER — ENOXAPARIN SODIUM 40 MG/0.4ML ~~LOC~~ SOLN
40.0000 mg | Freq: Every day | SUBCUTANEOUS | Status: DC
  Administered 2014-02-08 – 2014-02-12 (×5): 40 mg via SUBCUTANEOUS
  Filled 2014-02-08 (×6): qty 0.4

## 2014-02-08 MED ORDER — INSULIN ASPART 100 UNIT/ML ~~LOC~~ SOLN
0.0000 [IU] | SUBCUTANEOUS | Status: DC
  Administered 2014-02-08 (×3): 2 [IU] via SUBCUTANEOUS
  Administered 2014-02-09: 4 [IU] via SUBCUTANEOUS
  Administered 2014-02-09 (×2): 2 [IU] via SUBCUTANEOUS

## 2014-02-08 MED ORDER — ALBUMIN HUMAN 5 % IV SOLN
12.5000 g | Freq: Once | INTRAVENOUS | Status: AC
  Administered 2014-02-08: 12.5 g via INTRAVENOUS
  Filled 2014-02-08: qty 250

## 2014-02-08 MED FILL — Lidocaine HCl IV Inj 20 MG/ML: INTRAVENOUS | Qty: 5 | Status: AC

## 2014-02-08 MED FILL — Magnesium Sulfate Inj 50%: INTRAMUSCULAR | Qty: 20 | Status: AC

## 2014-02-08 MED FILL — Heparin Sodium (Porcine) Inj 1000 Unit/ML: INTRAMUSCULAR | Qty: 30 | Status: AC

## 2014-02-08 MED FILL — Sodium Bicarbonate IV Soln 8.4%: INTRAVENOUS | Qty: 50 | Status: AC

## 2014-02-08 MED FILL — Dexmedetomidine HCl IV Soln 200 MCG/2ML: INTRAVENOUS | Qty: 2 | Status: AC

## 2014-02-08 MED FILL — Heparin Sodium (Porcine) Inj 1000 Unit/ML: INTRAMUSCULAR | Qty: 10 | Status: AC

## 2014-02-08 MED FILL — Sodium Chloride IV Soln 0.9%: INTRAVENOUS | Qty: 2000 | Status: AC

## 2014-02-08 MED FILL — Mannitol IV Soln 20%: INTRAVENOUS | Qty: 500 | Status: AC

## 2014-02-08 MED FILL — Electrolyte-R (PH 7.4) Solution: INTRAVENOUS | Qty: 3000 | Status: AC

## 2014-02-08 NOTE — Progress Notes (Signed)
Extubated earlier today  He continues to have a lot of wheezing  BP 112/68  Pulse 98  Temp(Src) 97.7 F (36.5 C) (Oral)  Resp 20  Ht 5\' 11"  (1.803 m)  Wt 209 lb 14.1 oz (95.2 kg)  BMI 29.29 kg/m2  SpO2 96%   Intake/Output Summary (Last 24 hours) at 02/08/14 1842 Last data filed at 02/08/14 1700  Gross per 24 hour  Intake 1763.26 ml  Output   2265 ml  Net -501.74 ml    Good diuresis  PM labs pending  Will continue nebs and give solumedrol BID for a couple of days to see if we can improve his pulmonary status

## 2014-02-08 NOTE — Procedures (Signed)
Extubation Procedure Note  Patient Details:   Name: Carl Lee DOB: [birth year] MRN: [id number]   Airway Documentation:  Airway 8 mm (Active)  Secured at (cm) 23 cm 02/08/2014  8:16 AM  Measured From Lips 02/08/2014  8:16 AM  Acacia Villas 02/08/2014  8:16 AM  Secured By [employer] 02/08/2014  8:16 AM  Tube Holder Repositioned Yes 02/08/2014  7:51 AM  Cuff Pressure (cm H2O) 24 cm H2O 02/08/2014  7:51 AM  Site Condition Dry 02/08/2014  8:16 AM    Evaluation  O2 sats: stable throughout Complications: No apparent complications Patient did tolerate procedure well. Bilateral Breath Sounds: Clear;Diminished Suctioning: Oral;Airway Yes  Patient extubated to 6L [place].  Sats currently 96%.  Vitals normal.  Positive cuff leak test.  NIF of -20, VC of 1000.  IS performed.  RT will continue to monitor.  Philomena Doheny 02/08/2014, 10:13 AM

## 2014-02-08 NOTE — Progress Notes (Signed)
     SUBJECTIVE: Intubated.   BP 90/58  Pulse 90  Temp(Src) 99.1 F (37.3 C) (Core (Comment))  Resp 23  Ht 5\' 11"  (1.803 m)  Wt 209 lb 14.1 oz (95.2 kg)  BMI 29.29 kg/m2  SpO2 92%  Intake/Output Summary (Last 24 hours) at 02/08/14 0957 Last data filed at 02/08/14 0700  Gross per 24 hour  Intake 4589.16 ml  Output   2940 ml  Net 1649.16 ml    PHYSICAL EXAM General: Well developed, well nourished, Intubated.  Lungs: Mechanical BS bilaterally with no wheezes or rhonci noted.  Heart: RRR with no murmurs noted. Abdomen: Bowel sounds are present. Soft, non-tender.  Extremities: No lower extremity edema.   LABS: Basic Metabolic Panel:  Recent Labs  02/06/14 1502  02/07/14 2000 02/07/14 2054 02/08/14 0500  NA 143  < >  --  133* 138  K 3.6*  < >  --  4.3 4.0  CL 99  < >  --  111 102  CO2 28  --   --   --  23  GLUCOSE 88  < >  --  165* 167*  BUN 18  < >  --  17 19  CREATININE 0.78  < > 0.56 0.60 0.53  CALCIUM 9.8  --   --   --  7.8*  MG  --   --  2.8*  --  2.4  < > = values in this interval not displayed. CBC:  Recent Labs  02/07/14 2000 02/07/14 2054 02/08/14 0500  WBC 21.2*  --  20.2*  HGB 13.1 13.6 12.8*  HCT 38.3* 40.0 38.1*  MCV 95.0  --  94.1  PLT 195  --  203   Cardiac Enzymes: No results found for this basename: CKTOTAL, CKMB, CKMBINDEX, TROPONINI,  in the last 72 hours Fasting Lipid Panel: No results found for this basename: CHOL, HDL, LDLCALC, TRIG, CHOLHDL, LDLDIRECT,  in the last 72 hours  Current Meds: . acetaminophen  1,000 mg Oral 4 times per day   Or  . acetaminophen (TYLENOL) oral liquid 160 mg/5 mL  1,000 mg Per Tube 4 times per day  . aspirin EC  325 mg Oral Daily   Or  . aspirin  324 mg Per Tube Daily  . bisacodyl  10 mg Oral Daily   Or  . bisacodyl  10 mg Rectal Daily  . budesonide-formoterol  2 puff Inhalation BID  . cefUROXime (ZINACEF)  IV  1.5 g Intravenous Q12H  . Chlorhexidine Gluconate Cloth  6 each Topical Q0600  .  docusate sodium  200 mg Oral Daily  . enoxaparin (LOVENOX) injection  40 mg Subcutaneous QHS  . furosemide  20 mg Intravenous BID  . insulin aspart  0-24 Units Subcutaneous 6 times per day  . [START ON 02/09/2014] insulin detemir  10 Units Subcutaneous Daily  . insulin detemir  20 Units Subcutaneous Once  . metoprolol tartrate  12.5 mg Oral BID   Or  . metoprolol tartrate  12.5 mg Per Tube BID  . mupirocin ointment  1 application Nasal BID  . [START ON 02/09/2014] pantoprazole  40 mg Oral Daily  . sodium chloride  3 mL Intravenous Q12H   ASSESSMENT AND PLAN:  1. CAD/Unstable angina: POD#1 3V CABG. Doing well. Hemodynamically stable. Reintubated yesterday due to wheezing. Plans to extubate today. Appreciate the care of the surgical team.   Carl Lee  7/31/20159:57 AM

## 2014-02-08 NOTE — Progress Notes (Signed)
1 Day Post-Op Procedure(s) (LRB): CORONARY ARTERY BYPASS GRAFTING (CABG) (N/A) INTRAOPERATIVE TRANSESOPHAGEAL ECHOCARDIOGRAM (N/A) Subjective: Intubated but awake and following commands  Objective: Vital signs in last 24 hours: Temp:  [96.6 F (35.9 C)-101.7 F (38.7 C)] 99.1 F (37.3 C) (07/31 0700) Pulse Rate:  [72-92] 90 (07/31 0700) Cardiac Rhythm:  [-] Atrial paced (07/31 0600) Resp:  [12-32] 14 (07/31 0700) BP: (82-119)/(48-93) 102/68 mmHg (07/31 0700) SpO2:  [91 %-100 %] 96 % (07/31 0700) Arterial Line BP: (68-135)/(39-92) 114/65 mmHg (07/31 0700) FiO2 (%):  [40 %-50 %] 40 % (07/31 0600) Weight:  [209 lb 14.1 oz (95.2 kg)] 209 lb 14.1 oz (95.2 kg) (07/31 0500)  Hemodynamic parameters for last 24 hours: PAP: (29-61)/(10-37) 31/18 mmHg CO:  [3.8 L/min-5.9 L/min] 5.3 L/min CI:  [1.8 L/min/m2-2.9 L/min/m2] 2.5 L/min/m2  Intake/Output from previous day: 07/30 0701 - 07/31 0700 In: 4589.2 [I.V.:3519.2; Blood:200; NG/GT:120; IV Piggyback:750] Out: 2940 [Urine:1980; Blood:700; Chest Tube:260] Intake/Output this shift:    General appearance: alert and cooperative Heart: regular rate and rhythm Lungs: clear to auscultation bilaterally Abdomen: normal findings: soft, non-tender  Lab Results:  Recent Labs  02/07/14 2000 02/07/14 2054 02/08/14 0500  WBC 21.2*  --  20.2*  HGB 13.1 13.6 12.8*  HCT 38.3* 40.0 38.1*  PLT 195  --  203   BMET:  Recent Labs  02/06/14 1502  02/07/14 2054 02/08/14 0500  NA 143  < > 133* 138  K 3.6*  < > 4.3 4.0  CL 99  < > 111 102  CO2 28  --   --  23  GLUCOSE 88  < > 165* 167*  BUN 18  < > 17 19  CREATININE 0.78  < > 0.60 0.53  CALCIUM 9.8  --   --  7.8*  < > = values in this interval not displayed.  PT/INR:  Recent Labs  02/07/14 1300  LABPROT 15.5*  INR 1.23   ABG    Component Value Date/Time   PHART 7.385 02/08/2014 0504   HCO3 24.7* 02/08/2014 0504   TCO2 26 02/08/2014 0504   ACIDBASEDEF 3.0* 02/07/2014 1656   O2SAT  92.0 02/08/2014 0504   CBG (last 3)   Recent Labs  02/07/14 2004 02/07/14 2338 02/08/14 0348  GLUCAP 160* 155* 166*    Assessment/Plan: S/P Procedure(s) (LRB): CORONARY ARTERY BYPASS GRAFTING (CABG) (N/A) INTRAOPERATIVE TRANSESOPHAGEAL ECHOCARDIOGRAM (N/A) POD # 1looks good this AM- overall picture c/w SIRS type reaction with bronchospasm, leukocytosis and hyperglycemia  CV- stable, good index, normal PA pressures- dc swan  ECG normal  Beta blocker, statin, ASA  RESP- reintubated yesterday for acute on chronic respiratory failure- severe wheezing and ineffective mechanics on BIPAP- lungs sound clear this morning- wean to extubate Continue nebs  RENAL- creatinine normal - lytes OK- diurese for volume overload  ENDO- CBG elevated, no history of DM- transition off insulin gtt  Anemia secondary to ABL- mild  SCD + enoxaparin for DVT prophylaxis  DC CT and OOB after extubation     LOS: 1 day    [name],STEVEN C 02/08/2014

## 2014-02-09 ENCOUNTER — Inpatient Hospital Stay (HOSPITAL_COMMUNITY): Payer: BC Managed Care – PPO

## 2014-02-09 DIAGNOSIS — J441 Chronic obstructive pulmonary disease with (acute) exacerbation: Secondary | ICD-10-CM

## 2014-02-09 DIAGNOSIS — J90 Pleural effusion, not elsewhere classified: Secondary | ICD-10-CM

## 2014-02-09 DIAGNOSIS — R7309 Other abnormal glucose: Secondary | ICD-10-CM

## 2014-02-09 LAB — GLUCOSE, CAPILLARY
GLUCOSE-CAPILLARY: 195 mg/dL — AB (ref 70–99)
GLUCOSE-CAPILLARY: 145 mg/dL — AB (ref 70–99)
GLUCOSE-CAPILLARY: 209 mg/dL — AB (ref 70–99)

## 2014-02-09 LAB — CBC
HEMATOCRIT: 32.3 % — AB (ref 39.0–52.0)
HEMOGLOBIN: 10.7 g/dL — AB (ref 13.0–17.0)
MCH: 31.1 pg (ref 26.0–34.0)
MCHC: 33.1 g/dL (ref 30.0–36.0)
MCV: 93.9 fL (ref 78.0–100.0)
Platelets: 192 10*3/uL (ref 150–400)
RBC: 3.44 MIL/uL — AB (ref 4.22–5.81)
RDW: 13.3 % (ref 11.5–15.5)
WBC: 22.1 10*3/uL — ABNORMAL HIGH (ref 4.0–10.5)

## 2014-02-09 LAB — BASIC METABOLIC PANEL
Anion gap: 10 (ref 5–15)
BUN: 20 mg/dL (ref 6–23)
CHLORIDE: 99 meq/L (ref 96–112)
CO2: 29 mEq/L (ref 19–32)
Calcium: 8.1 mg/dL — ABNORMAL LOW (ref 8.4–10.5)
Creatinine, Ser: 0.5 mg/dL (ref 0.50–1.35)
GFR calc non Af Amer: >90 mL/min (ref >90)
GLUCOSE: 152 mg/dL — AB (ref 70–99)
POTASSIUM: 4.7 meq/L (ref 3.7–5.3)
Sodium: 138 mEq/L (ref 137–147)

## 2014-02-09 MED ORDER — METOPROLOL TARTRATE 25 MG/10 ML ORAL SUSPENSION
25.0000 mg | Freq: Two times a day (BID) | ORAL | Status: DC
  Filled 2014-02-09 (×10): qty 10

## 2014-02-09 MED ORDER — DEXTROSE 5 % IV SOLN
1.0000 g | Freq: Three times a day (TID) | INTRAVENOUS | Status: DC
  Administered 2014-02-09 – 2014-02-11 (×7): 1 g via INTRAVENOUS
  Filled 2014-02-09 (×9): qty 1

## 2014-02-09 MED ORDER — METOPROLOL TARTRATE 25 MG PO TABS
25.0000 mg | ORAL_TABLET | Freq: Two times a day (BID) | ORAL | Status: DC
  Administered 2014-02-09 – 2014-02-13 (×9): 25 mg via ORAL
  Filled 2014-02-09 (×10): qty 1

## 2014-02-09 MED ORDER — BUDESONIDE 0.25 MG/2ML IN SUSP
0.2500 mg | Freq: Four times a day (QID) | RESPIRATORY_TRACT | Status: DC
  Administered 2014-02-09: 0.25 mg via RESPIRATORY_TRACT
  Filled 2014-02-09 (×4): qty 2

## 2014-02-09 MED ORDER — IPRATROPIUM BROMIDE 0.02 % IN SOLN
0.5000 mg | Freq: Four times a day (QID) | RESPIRATORY_TRACT | Status: DC
  Administered 2014-02-09 – 2014-02-10 (×3): 0.5 mg via RESPIRATORY_TRACT
  Filled 2014-02-09 (×4): qty 2.5

## 2014-02-09 MED ORDER — BUDESONIDE 0.25 MG/2ML IN SUSP
0.5000 mg | Freq: Two times a day (BID) | RESPIRATORY_TRACT | Status: DC
  Administered 2014-02-09: 0.5 mg via RESPIRATORY_TRACT
  Filled 2014-02-09 (×4): qty 4

## 2014-02-09 MED ORDER — GUAIFENESIN ER 600 MG PO TB12
1200.0000 mg | ORAL_TABLET | Freq: Two times a day (BID) | ORAL | Status: DC
  Administered 2014-02-09 – 2014-02-13 (×9): 1200 mg via ORAL
  Filled 2014-02-09 (×10): qty 2

## 2014-02-09 MED ORDER — ALBUTEROL SULFATE (2.5 MG/3ML) 0.083% IN NEBU
2.5000 mg | INHALATION_SOLUTION | Freq: Four times a day (QID) | RESPIRATORY_TRACT | Status: DC
  Administered 2014-02-09 – 2014-02-10 (×3): 2.5 mg via RESPIRATORY_TRACT
  Filled 2014-02-09 (×4): qty 3

## 2014-02-09 MED ORDER — VANCOMYCIN HCL IN DEXTROSE 1-5 GM/200ML-% IV SOLN
1000.0000 mg | Freq: Three times a day (TID) | INTRAVENOUS | Status: DC
  Administered 2014-02-09 – 2014-02-11 (×7): 1000 mg via INTRAVENOUS
  Filled 2014-02-09 (×8): qty 200

## 2014-02-09 MED ORDER — CEFTAZIDIME 1 G IJ SOLR
1.0000 g | Freq: Two times a day (BID) | INTRAMUSCULAR | Status: DC
  Filled 2014-02-09 (×2): qty 1

## 2014-02-09 NOTE — Progress Notes (Signed)
2 Days Post-Op Procedure(s) (LRB): CORONARY ARTERY BYPASS GRAFTING (CABG) (N/A) INTRAOPERATIVE TRANSESOPHAGEAL ECHOCARDIOGRAM (N/A) Subjective: Feels better today but still with frequent cough  Objective: Vital signs in last 24 hours: Temp:  [97.7 F (36.5 C)-99.5 F (37.5 C)] 97.9 F (36.6 C) (08/01 0750) Pulse Rate:  [90-105] 94 (08/01 0800) Cardiac Rhythm:  [-] Normal sinus rhythm (08/01 0800) Resp:  [14-26] 20 (08/01 0800) BP: (81-133)/(50-80) 118/79 mmHg (08/01 0800) SpO2:  [90 %-99 %] 96 % (08/01 0822) Arterial Line BP: (82-122)/(49-117) 117/57 mmHg (07/31 1700) Weight:  [201 lb 1 oz (91.2 kg)] 201 lb 1 oz (91.2 kg) (08/01 0600)  Hemodynamic parameters for last 24 hours: PAP: (28-41)/(15-20) 37/18 mmHg  Intake/Output from previous day: 07/31 0701 - 08/01 0700 In: 616.6 [I.V.:516.6; IV Piggyback:100] Out: 3035 [Urine:3035] Intake/Output this shift: Total I/O In: 20 [I.V.:20] Out: 350 [Urine:350]  General appearance: alert, cooperative and no distress Neurologic: intact Heart: tachy, regular Lungs: rhonchi bilaterally, wheezes bilaterally and diminished in bases Wound: clean and dry  Lab Results:  Recent Labs  02/08/14 0500 02/09/14 0410  WBC 20.2* 22.1*  HGB 12.8* 10.7*  HCT 38.1* 32.3*  PLT 203 192   BMET:  Recent Labs  02/08/14 0500 02/09/14 0410  NA 138 138  K 4.0 4.7  CL 102 99  CO2 23 29  GLUCOSE 167* 152*  BUN 19 20  CREATININE 0.53 0.50  CALCIUM 7.8* 8.1*    PT/INR:  Recent Labs  02/07/14 1300  LABPROT 15.5*  INR 1.23   ABG    Component Value Date/Time   PHART 7.399 02/08/2014 0914   HCO3 25.3* 02/08/2014 0914   TCO2 27 02/08/2014 0914   ACIDBASEDEF 3.0* 02/07/2014 1656   O2SAT 89.0 02/08/2014 0914   CBG (last 3)   Recent Labs  02/08/14 2333 02/09/14 0342 02/09/14 0744  GLUCAP 128* 195* 145*    Assessment/Plan: S/P Procedure(s) (LRB): CORONARY ARTERY BYPASS GRAFTING (CABG) (N/A) INTRAOPERATIVE TRANSESOPHAGEAL  ECHOCARDIOGRAM (N/A)  CV- stable  RESP_ primary issue- acute on chronic respiratory failure  CXR shows atelectasis, possible pneumonia  Will continue steroids another 24 hours  Continue nebs, IS, add flutter and mucinex  Start vanco and fortaz empirically for presumed pneumonia  RENAL_ creatinine OK, continue diuresis  ENDO- CBG elevated, continue SSI  Enoxaparin and SCD for DVT prophylaxis  OOB and ambulate   LOS: 2 days    Christopher Glasscock C 02/09/2014

## 2014-02-09 NOTE — Progress Notes (Signed)
ANTIBIOTIC CONSULT NOTE - INITIAL  Pharmacy Consult for vancomycin Indication: pneumonia  Allergies  Allergen Reactions  . Codeine Other (See Comments)    Nightmares    Patient Measurements: Height: 5\' 11"  (180.3 cm) Weight: 201 lb 1 oz (91.2 kg) IBW/kg (Calculated) : 75.3   Vital Signs: Temp: 97.9 F (36.6 C) (08/01 0750) Temp src: Oral (08/01 0750) BP: 118/79 mmHg (08/01 0800) Pulse Rate: 94 (08/01 0800) Intake/Output from previous day: 07/31 0701 - 08/01 0700 In: 616.6 [I.V.:516.6; IV Piggyback:100] Out: 3035 [Urine:3035] Intake/Output from this shift: Total I/O In: 20 [I.V.:20] Out: 350 [Urine:350]  Labs:  Recent Labs  02/07/14 2000 02/07/14 2054 02/08/14 0500 02/09/14 0410  WBC 21.2*  --  20.2* 22.1*  HGB 13.1 13.6 12.8* 10.7*  PLT 195  --  203 192  CREATININE 0.56 0.60 0.53 0.50   Estimated Creatinine Clearance: 114.9 ml/min (by C-G formula based on Cr of 0.5). No results found for this basename: VANCOTROUGH, Corlis Leak, VANCORANDOM, GENTTROUGH, GENTPEAK, GENTRANDOM, TOBRATROUGH, TOBRAPEAK, TOBRARND, AMIKACINPEAK, AMIKACINTROU, AMIKACIN,  in the last 72 hours   Microbiology: Recent Results (from the past 720 hour(s))  SURGICAL PCR SCREEN     Status: Abnormal   Collection Time    02/06/14  2:36 PM      Result Value Ref Range Status   MRSA, PCR POSITIVE (*) NEGATIVE Final   Staphylococcus aureus POSITIVE (*) NEGATIVE Final   Comment:            The Xpert SA Assay (FDA     approved for NASAL specimens     in patients over 109 years of age),     is one component of     a comprehensive surveillance     program.  Test performance has     been validated by [employer] for patients greater     than or equal to 75 year old.     It is not intended     to diagnose infection nor to     guide or monitor treatment.    Medical History: Past Medical History  Diagnosis Date  . Bladder cancer 08/16/2008    [facility] [place] now [place])  .  Hypertension   . AAA (abdominal aortic aneurysm) 03/25/12    s/p endovascular repair  . Hx of migraines   . Contact dermatitis     atypical [name])  . Childhood asthma   . Environmental allergies     improved as ages  . Diverticulosis 2013    severe by CT and colonoscopy  . Adenomatous polyp 06/2009  . Lumbosacral radiculopathy at S1 03/2012    left, with spinal stenosis (MRI 04/2013) improved with TF ESI L5/S1 and S1/2 ([facility])  . Complication of anesthesia     last anesth. pt. reports that he had lack of energy for a yr. plus  . Coronary artery disease   . Headache(784.0)     on & off- none since AAA repair   . Arthritis     spinal stenosis, "bulging disc"- S1, hsa been followed by [facility]      Assessment: 58 yo M POD#2 CABG to start vanc/ceftaz for PNA.  CXR: atelectasis, possible PNA.  WBC 22.1, elevated due to solumedrol, AF, creat 0.5, wt 91 kg.  vanc 7/30 x 2 doses perioperatively zinacef 7/30-8/1 vanc 8/1>> ceftaz 8/1>>   Goal of Therapy:  Vancomycin trough level 15-20 mcg/ml  Plan:  -vancomycin 1000 mg IV q8h -increase ceftazidime  from 1 gm q12h to 1 gm q8h -f/u renal fxn, wbc, temp, culture data, CXR, clinical course -steady-state vancomycin trough as needed Eudelia Bunch, Pharm.D. [phone] 02/09/2014 9:06 AM

## 2014-02-09 NOTE — Progress Notes (Signed)
Subjective:  Extubated yesterday and sitting up in chair, left-sided chest wall soreness, very minimal shortness of breath  Objective:  Vital Signs in the last 24 hours: BP 118/79  Pulse 94  Temp(Src) 98.4 F (36.9 C) (Oral)  Resp 20  Ht 5\' 11"  (1.803 m)  Wt 91.2 kg (201 lb 1 oz)  BMI 28.05 kg/m2  SpO2 96%  Physical Exam: Pleasant white male in no acute distress Lungs: Reduced breath sounds Cardiac:  Regular rhythm, normal S1 and S2, no S3 Extremities:  1-2+ edema  Intake/Output from previous day: 07/31 0701 - 08/01 0700 In: 616.6 [I.V.:516.6; IV Piggyback:100] Out: 3035 [Urine:3035] Weight Filed Weights   02/07/14 0557 02/08/14 0500 02/09/14 0600  Weight: 87.998 kg (194 lb) 95.2 kg (209 lb 14.1 oz) 91.2 kg (201 lb 1 oz)    Lab Results: Basic Metabolic Panel:  Recent Labs  02/08/14 0500 02/09/14 0410  NA 138 138  K 4.0 4.7  CL 102 99  CO2 23 29  GLUCOSE 167* 152*  BUN 19 20  CREATININE 0.53 0.50    CBC:  Recent Labs  02/08/14 0500 02/09/14 0410  WBC 20.2* 22.1*  HGB 12.8* 10.7*  HCT 38.1* 32.3*  MCV 94.1 93.9  PLT 203 192   Telemetry: Sinus rhythm  Assessment/Plan:  1. Doing well post bypass at this point in time. Very minimal shortness of breath. Not currently wheezing 2. COPD 3. Previous coronary bypass grafting      W. Doristine Church  MD [facility]  02/09/2014, 11:43 AM

## 2014-02-09 NOTE — Consult Note (Signed)
PULMONARY / CRITICAL CARE MEDICINE   Name: Carl Lee MRN: [id number] DOB: 07-21-1955    ADMISSION DATE:  02/07/2014 CONSULTATION DATE:  8/1  REFERRING MD :  CVTS  CHIEF COMPLAINT:  Wheezes  STUDIES:    SIGNIFICANT EVENTS: 7/30 cabg x 3   HISTORY OF PRESENT ILLNESS:   58 yo smoker (age 13 -64) with 3 vessel CAD who underwent CABG x3 per CVTS on 7/30. He was extubated and is sitting up in chair. + for wheezing and some concern for pna by c x r(suspect more due to atx than infection)elevated wbc. He has been placed on systemic steroids and abx for this presume infection. PCCM has been tasked with pulmonary optimization as of 8/1. PMH - hyperlipidemia, hypercholesterolemia ,He has a hx of bladder ca with Reoccurrence., pre-op myoview positive, cath showed LAD total occlusion, RCA 80% stenosis. 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    PAST MEDICAL HISTORY :  Past Medical History  Diagnosis Date  . Bladder cancer 08/16/2008    [facility] then [place] now [place])  . Hypertension   . AAA (abdominal aortic aneurysm) 03/25/12    s/p endovascular repair  . Hx of migraines   . Contact dermatitis     atypical [name])  . Childhood asthma   . Environmental allergies     improved as ages  . Diverticulosis 2013    severe by CT and colonoscopy  . Adenomatous polyp 06/2009  . Lumbosacral radiculopathy at S1 03/2012    left, with spinal stenosis (MRI 04/2013) improved with TF ESI L5/S1 and S1/2 ([facility])  . Complication of anesthesia     last anesth. pt. reports that he had lack of energy for a yr. plus  . Coronary artery disease   . Headache(784.0)     on & off- none since AAA repair   . Arthritis     spinal stenosis, "bulging disc"- S1, hsa been followed by [facility]    Past Surgical History  Procedure Laterality Date  . Tonsillectomy and adenoidectomy  1973  . Vasectomy    . Bladder cancer      [facility])  .  Abdominal aortic aneurysm repair  03/25/12  . Colonoscopy  06/2012    hyperplastic polyp, diverticulosis ([facility]) rec rpt 5 yrs  . Cystoscopy  08/16/08    Bladder Cancer  . Esi  04/2013, 06/2013    L L5S1, S12 transforaminal ESI (Dr.  [name])  . Abi  05/2013    WNL, L TBI low at 0.66  . Coronary artery bypass graft N/A 02/07/2014    Procedure: CORONARY ARTERY BYPASS GRAFTING (CABG);  Surgeon: [name], MD;  Location: [facility];  Service: Open Heart Surgery;  Laterality: N/A;  CABG X 3, BILATERAL LIMA, EVH  . Intraoperative transesophageal echocardiogram N/A 02/07/2014    Procedure: INTRAOPERATIVE TRANSESOPHAGEAL ECHOCARDIOGRAM;  Surgeon: [name], MD;  Location: [facility];  Service: Open Heart Surgery;  Laterality: N/A;   Prior to Admission medications   Medication Sig Start Date End Date Taking? Authorizing Provider  acetaminophen (TYLENOL) 500 MG tablet Take 500 mg by mouth every 6 (six) hours as needed for pain.   Yes Historical Provider, MD  aspirin 81 MG tablet Take 81 mg by mouth daily.   Yes Historical Provider, MD  Glucosamine-Chondroit-Vit C-Mn (GLUCOSAMINE 1500 COMPLEX PO) Take 2 tablets by mouth daily.   Yes Historical Provider, MD  lisinopril-hydrochlorothiazide Reita May)  20-25 MG per tablet Take 1 tablet by mouth daily. 10/30/12  Yes [name], MD  LORazepam (ATIVAN) 0.5 MG tablet Take 1 tablet (0.5 mg total) by mouth 2 (two) times daily as needed for anxiety. 02/04/14  Yes [name], MD  Multiple Vitamins-Minerals (CENTRUM SILVER ADULT 50+ PO) Take 1 capsule by mouth daily.   Yes Historical Provider, MD   Allergies  Allergen Reactions  . Codeine Other (See Comments)    Nightmares    FAMILY HISTORY:  Family History  Problem Relation Age of Onset  . Diabetes Mother   . Arrhythmia Mother     pacemaker  . Cancer Mother     Bladder  . COPD Mother   . Thyroid disease Mother   . Hyperlipidemia Mother   . Hypertension Mother    . Diabetes Father   . CAD Father 81    CHF, MI  . Dementia Father   . Diabetes Sister   . Colon cancer Neg Hx   . Diabetes Brother   . Hypertension Brother    SOCIAL HISTORY:  reports that he quit smoking 4 days ago. His smoking use included Cigarettes. He smoked 0.00 packs per day for 30 years. He has never used smokeless tobacco. He reports that he drinks alcohol. He reports that he does not use illicit drugs.  REVIEW OF SYSTEMS:   Constitutional: negative for anorexia, fevers and sweats  Eyes: negative for irritation, redness and visual disturbance  Ears, nose, mouth, throat, and face: negative for earaches, epistaxis, nasal congestion and sore throat  Respiratory: negative for cough, sputum and wheezing  Cardiovascular: negative for  lower extremity edema, orthopnea, palpitations and syncope  Gastrointestinal: negative for abdominal pain, constipation, diarrhea, melena, nausea and vomiting  Genitourinary:negative for dysuria, frequency and hematuria  Hematologic/lymphatic: negative for bleeding, easy bruising and lymphadenopathy  Musculoskeletal:negative for arthralgias, muscle weakness and stiff joints  Neurological: negative for coordination problems, gait problems, headaches and weakness  Endocrine: negative for diabetic symptoms including polydipsia, polyuria and weight loss    SUBJECTIVE:   VITAL SIGNS: Temp:  [97.7 F (36.5 C)-99.1 F (37.3 C)] 97.9 F (36.6 C) (08/01 0750) Pulse Rate:  [90-105] 94 (08/01 0800) Resp:  [14-25] 20 (08/01 0800) BP: (81-133)/(50-80) 118/79 mmHg (08/01 0800) SpO2:  [90 %-99 %] 96 % (08/01 0822) Arterial Line BP: (82-122)/(49-117) 117/57 mmHg (07/31 1700) Weight:  [201 lb 1 oz (91.2 kg)] 201 lb 1 oz (91.2 kg) (08/01 0600) HEMODYNAMICS: PAP: (37)/(18) 37/18 mmHg VENTILATOR SETTINGS:   INTAKE / OUTPUT:  Intake/Output Summary (Last 24 hours) at 02/09/14 1057 Last data filed at 02/09/14 0933  Gross per 24 hour  Intake  817.5 ml   Output   3410 ml  Net -2592.5 ml    PHYSICAL EXAMINATION: General:  WNWDWM NAD sitting in chair Neuro:  Intact HEENT:  No JVD/LAN rt i j cordis Cardiovascular:  HS RRR Lungs:  Decreased bs bases, mild wheeze and basilar crackles Abdomen:  Soft + bs Musculoskeletal:  intact Skin:  Warm and dry  LABS:  CBC  Recent Labs Lab 02/07/14 2000 02/07/14 2054 02/08/14 0500 02/09/14 0410  WBC 21.2*  --  20.2* 22.1*  HGB 13.1 13.6 12.8* 10.7*  HCT 38.3* 40.0 38.1* 32.3*  PLT 195  --  203 192   Coag's  Recent Labs Lab 02/06/14 1502 02/07/14 1300  APTT 30 31  INR 0.90 1.23   BMET  Recent Labs Lab 02/06/14 1502  02/07/14 2054 02/08/14 0500 02/09/14 0410  NA 143  < > 133* 138 138  K 3.6*  < > 4.3 4.0 4.7  CL 99  < > 111 102 99  CO2 28  --   --  23 29  BUN 18  < > 17 19 20   CREATININE 0.78  < > 0.60 0.53 0.50  GLUCOSE 88  < > 165* 167* 152*  < > = values in this interval not displayed. Electrolytes  Recent Labs Lab 02/06/14 1502 02/07/14 2000 02/08/14 0500 02/09/14 0410  CALCIUM 9.8  --  7.8* 8.1*  MG  --  2.8* 2.4  --    Sepsis Markers No results found for this basename: LATICACIDVEN, PROCALCITON, O2SATVEN,  in the last 168 hours ABG  Recent Labs Lab 02/07/14 1656 02/08/14 0504 02/08/14 0914  PHART 7.299* 7.385 7.399  PCO2ART 47.6* 41.6 41.0  PO2ART 57.0* 66.0* 58.0*   Liver Enzymes  Recent Labs Lab 02/06/14 1502  AST 15  ALT 8  ALKPHOS 82  BILITOT 0.5  ALBUMIN 4.1   Cardiac Enzymes No results found for this basename: TROPONINI, PROBNP,  in the last 168 hours Glucose  Recent Labs Lab 02/08/14 1145 02/08/14 1554 02/08/14 1946 02/08/14 2333 02/09/14 0342 02/09/14 0744  GLUCAP 117* 122* 140* 128* 195* 145*    Imaging Dg Chest Portable 1 View In Am  02/08/2014   CLINICAL DATA:  Coronary artery bypass.  EXAM: PORTABLE CHEST - 1 VIEW  COMPARISON:  02/07/2014 .  FINDINGS: Endotracheal tube, NG tube, mediastinal drainage catheter,  bilateral chest tubes, Swan-Ganz catheter in stable position. Mediastinum and hilar structures are stable. Stable cardiomegaly with normal pulmonary vascularity. Basilar atelectasis and/or infiltrates and small pleural effusions. No pneumothorax.  IMPRESSION: 1. Stable line and tube positions. Bilateral chest tubes are unchanged. No pneumothorax. 2. Prior CABG.  Heart size stable. 3. Bibasilar pulmonary infiltrates and pleural effusions. Pulmonary infiltrates may be related to pulmonary edema and/or pneumonia.   Electronically Signed   By: Marcello Moores  Register   On: 02/08/2014 07:42   CXR-small Bl effusions  ASSESSMENT / PLAN:  PULMONARY OETT*7/30>>7/31 A: Pot CABG with normal extubation COPD as confirmed by PFT's with moderate obstruction. Post cabg atx BL effusions P:   Pulmicort , ct duonebs q 6h Solumedrol 60 q 12h I suspect lasix will help most Pulmonary toilet O2 as needed Mobilize Flutter valve   CARDIOVASCULAR CVL 7/30 rt i j cordis>> A: CAD post CABG x 3 7/30 P:  Per cvts    INFECTIOUS A:   Presumed pna P:    Abx: ceftazime, start date8/1, day 0/x Abx: *vanc, start date8/1, day 0/x   ENDOCRINE A:   Steroid induced hyperglycemia   P:   Decrease steroids in 24hsteroids SSI for glucose >160    TODAY'S SUMMARY: 58 yo former smoker (age 6 -29) with 3 vessel CAD who underwent CABG x3 per CVTS on 7/30. Would treat as COPD flare, but suspect effusions playing a role & diuresis will help  Richardson Landry Minor ACNP [facility] P[facility] Pager [phone] till 3 pm If no answer page [phone]  I have personally obtained a history, examined the patient, evaluated laboratory and imaging results, formulated the assessment and plan and placed orders.  Rigoberto Noel MD 02/09/2014, 10:58 AM

## 2014-02-09 NOTE — Progress Notes (Signed)
Up in chair  Still with some incisional pain  BP 111/70  Pulse 96  Temp(Src) 97.5 F (36.4 C) (Oral)  Resp 20  Ht 5\' 11"  (1.803 m)  Wt 201 lb 1 oz (91.2 kg)  BMI 28.05 kg/m2  SpO2 94%   Intake/Output Summary (Last 24 hours) at 02/09/14 1738 Last data filed at 02/09/14 1714  Gross per 24 hour  Intake    960 ml  Output   3755 ml  Net  -2795 ml    Continues to diurese well

## 2014-02-10 ENCOUNTER — Inpatient Hospital Stay (HOSPITAL_COMMUNITY): Payer: BC Managed Care – PPO

## 2014-02-10 LAB — GLUCOSE, CAPILLARY
GLUCOSE-CAPILLARY: 149 mg/dL — AB (ref 70–99)
Glucose-Capillary: 126 mg/dL — ABNORMAL HIGH (ref 70–99)
Glucose-Capillary: 146 mg/dL — ABNORMAL HIGH (ref 70–99)
Glucose-Capillary: 137 mg/dL — ABNORMAL HIGH (ref 70–99)

## 2014-02-10 LAB — BASIC METABOLIC PANEL
Anion gap: 13 (ref 5–15)
BUN: 15 mg/dL (ref 6–23)
CO2: 28 mEq/L (ref 19–32)
CREATININE: 0.52 mg/dL (ref 0.50–1.35)
Calcium: 8.4 mg/dL (ref 8.4–10.5)
Chloride: 97 mEq/L (ref 96–112)
GFR calc Af Amer: >90 mL/min (ref >90)
Glucose, Bld: 169 mg/dL — ABNORMAL HIGH (ref 70–99)
Potassium: 4 mEq/L (ref 3.7–5.3)
SODIUM: 138 meq/L (ref 137–147)

## 2014-02-10 LAB — CBC
HCT: 32.6 % — ABNORMAL LOW (ref 39.0–52.0)
Hemoglobin: 10.9 g/dL — ABNORMAL LOW (ref 13.0–17.0)
MCH: 31.2 pg (ref 26.0–34.0)
MCHC: 33.4 g/dL (ref 30.0–36.0)
MCV: 93.4 fL (ref 78.0–100.0)
PLATELETS: 239 10*3/uL (ref 150–400)
RBC: 3.49 MIL/uL — ABNORMAL LOW (ref 4.22–5.81)
RDW: 13.1 % (ref 11.5–15.5)
WBC: 23.2 10*3/uL — ABNORMAL HIGH (ref 4.0–10.5)

## 2014-02-10 MED ORDER — IPRATROPIUM BROMIDE 0.02 % IN SOLN
0.5000 mg | Freq: Four times a day (QID) | RESPIRATORY_TRACT | Status: DC
  Administered 2014-02-10 – 2014-02-11 (×5): 0.5 mg via RESPIRATORY_TRACT
  Filled 2014-02-10 (×4): qty 2.5

## 2014-02-10 MED ORDER — FUROSEMIDE 40 MG PO TABS
40.0000 mg | ORAL_TABLET | Freq: Every day | ORAL | Status: DC
  Administered 2014-02-10 – 2014-02-13 (×4): 40 mg via ORAL
  Filled 2014-02-10 (×5): qty 1

## 2014-02-10 MED ORDER — DEXTROSE 5 % IV SOLN
2.0000 ug/min | INTRAVENOUS | Status: DC
  Filled 2014-02-10: qty 4

## 2014-02-10 MED ORDER — INSULIN ASPART 100 UNIT/ML ~~LOC~~ SOLN
0.0000 [IU] | Freq: Three times a day (TID) | SUBCUTANEOUS | Status: DC
  Administered 2014-02-10 – 2014-02-12 (×3): 2 [IU] via SUBCUTANEOUS

## 2014-02-10 MED ORDER — ARFORMOTEROL TARTRATE 15 MCG/2ML IN NEBU
15.0000 ug | INHALATION_SOLUTION | Freq: Two times a day (BID) | RESPIRATORY_TRACT | Status: DC
  Administered 2014-02-10 – 2014-02-12 (×5): 15 ug via RESPIRATORY_TRACT
  Filled 2014-02-10 (×8): qty 2

## 2014-02-10 MED ORDER — VASOPRESSIN 20 UNIT/ML IJ SOLN
0.0300 [IU]/min | INTRAVENOUS | Status: DC
  Filled 2014-02-10: qty 2.5

## 2014-02-10 MED ORDER — METHYLPREDNISOLONE SODIUM SUCC 40 MG IJ SOLR
40.0000 mg | Freq: Two times a day (BID) | INTRAMUSCULAR | Status: AC
  Administered 2014-02-10: 40 mg via INTRAVENOUS
  Filled 2014-02-10 (×2): qty 1

## 2014-02-10 MED ORDER — POTASSIUM CHLORIDE CRYS ER 20 MEQ PO TBCR
20.0000 meq | EXTENDED_RELEASE_TABLET | Freq: Two times a day (BID) | ORAL | Status: DC
  Administered 2014-02-10 – 2014-02-13 (×7): 20 meq via ORAL
  Filled 2014-02-10 (×8): qty 1

## 2014-02-10 MED ORDER — SODIUM CHLORIDE 0.9 % IV BOLUS (SEPSIS): 1000.0000 mL | Freq: Once | INTRAVENOUS | Status: DC

## 2014-02-10 MED ORDER — BUDESONIDE 0.5 MG/2ML IN SUSP
0.5000 mg | Freq: Two times a day (BID) | RESPIRATORY_TRACT | Status: DC
  Administered 2014-02-10 – 2014-02-12 (×5): 0.5 mg via RESPIRATORY_TRACT
  Filled 2014-02-10 (×7): qty 2

## 2014-02-10 NOTE — Progress Notes (Signed)
Feels well this PM  BP 134/76  Pulse 91  Temp(Src) 97.7 F (36.5 C) (Oral)  Resp 21  Ht 5\' 11"  (1.803 m)  Wt 198 lb 3.1 oz (89.9 kg)  BMI 27.65 kg/m2  SpO2 93%   Intake/Output Summary (Last 24 hours) at 02/10/14 2005 Last data filed at 02/10/14 1900  Gross per 24 hour  Intake   1190 ml  Output   3425 ml  Net  -2235 ml    Continue respiratory treatments

## 2014-02-10 NOTE — Progress Notes (Signed)
3 Days Post-Op Procedure(s) (LRB): CORONARY ARTERY BYPASS GRAFTING (CABG) (N/A) INTRAOPERATIVE TRANSESOPHAGEAL ECHOCARDIOGRAM (N/A) Subjective: Feels better this AM, but did have an episode of severe anxiety last PM Ambulated a small amount yesterday  Objective: Vital signs in last 24 hours: Temp:  [97.5 F (36.4 C)-98.4 F (36.9 C)] 97.8 F (36.6 C) (08/02 0810) Pulse Rate:  [80-104] 82 (08/02 0700) Cardiac Rhythm:  [-] Normal sinus rhythm (08/02 0700) Resp:  [18-31] 19 (08/02 0700) BP: (102-138)/(60-90) 119/79 mmHg (08/02 0700) SpO2:  [88 %-100 %] 94 % (08/02 0700) Weight:  [198 lb 3.1 oz (89.9 kg)] 198 lb 3.1 oz (89.9 kg) (08/02 0600)  Hemodynamic parameters for last 24 hours:    Intake/Output from previous day: 08/01 0701 - 08/02 0700 In: 1160 [I.V.:410; IV Piggyback:750] Out: 4225 [Urine:4225] Intake/Output this shift: Total I/O In: 20 [I.V.:20] Out: 100 [Urine:100]  General appearance: alert, cooperative and no distress Neurologic: intact Heart: regular rate and rhythm Lungs: wheezes faint bilaterally Wound: clean and dry  Lab Results:  Recent Labs  02/09/14 0410 02/10/14 0310  WBC 22.1* 23.2*  HGB 10.7* 10.9*  HCT 32.3* 32.6*  PLT 192 239   BMET:  Recent Labs  02/09/14 0410 02/10/14 0310  NA 138 138  K 4.7 4.0  CL 99 97  CO2 29 28  GLUCOSE 152* 169*  BUN 20 15  CREATININE 0.50 0.52  CALCIUM 8.1* 8.4    PT/INR:  Recent Labs  02/07/14 1300  LABPROT 15.5*  INR 1.23   ABG    Component Value Date/Time   PHART 7.399 02/08/2014 0914   HCO3 25.3* 02/08/2014 0914   TCO2 27 02/08/2014 0914   ACIDBASEDEF 3.0* 02/07/2014 1656   O2SAT 89.0 02/08/2014 0914   CBG (last 3)   Recent Labs  02/09/14 0744 02/09/14 1133 02/10/14 0757  GLUCAP 145* 209* 149*    Assessment/Plan: S/P Procedure(s) (LRB): CORONARY ARTERY BYPASS GRAFTING (CABG) (N/A) INTRAOPERATIVE TRANSESOPHAGEAL ECHOCARDIOGRAM (N/A) POD # 3 Continues to improve  CV-  stable  RESP- remains primary issue  CXR shows improved bibasilar atelectasis  Bronchodilators adjusted by Pulmonary  RENAL- diuresed well with IV lasix- will change to PO  ENDO- CBG mildly elevated, should improve now that he's off IV steroids  Increase mobility  DC foley   LOS: 3 days    Lauretta Sallas C 02/10/2014

## 2014-02-10 NOTE — Progress Notes (Signed)
PULMONARY / CRITICAL CARE MEDICINE   Name: Carl Lee MRN: [id number] DOB: 10-May-1956    ADMISSION DATE:  02/07/2014 CONSULTATION DATE:  8/1  REFERRING MD :  CVTS  CHIEF COMPLAINT:  Wheezes  STUDIES:    SIGNIFICANT EVENTS: 7/30 cabg x 3   HISTORY OF PRESENT ILLNESS:   58 yo smoker (age 58 -1) (age 58 -1) with 3 vessel CAD who underwent CABG x3 per CVTS on 7/30. He was extubated and is sitting up in chair. + for wheezing and some concern for pna by c x r(suspect more due to atx than infection)elevated wbc. He has been placed on systemic steroids and abx for this presume infection. PCCM has been tasked with pulmonary optimization as of 8/1. PMH - hyperlipidemia, hypercholesterolemia ,He has a hx of bladder ca with Reoccurrence., pre-op myoview positive, cath showed LAD total occlusion, RCA 80% stenosis. 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     SUBJECTIVE: Episode of sudden onset dyspnea after neb Rx @ 2am Subsided with ativan Nebs make him anxious 'always has'  oob this am, on 5L [place] Denies CP  VITAL SIGNS: Temp:  [97.5 F (36.4 C)-98.4 F (36.9 C)] 98 F (36.7 C) (08/02 0410) Pulse Rate:  [80-104] 82 (08/02 0700) Resp:  [14-31] 19 (08/02 0700) BP: (102-138)/(60-90) 119/79 mmHg (08/02 0700) SpO2:  [88 %-100 %] 94 % (08/02 0700) Weight:  [89.9 kg (198 lb 3.1 oz)] 89.9 kg (198 lb 3.1 oz) (08/02 0600) HEMODYNAMICS:   VENTILATOR SETTINGS:   INTAKE / OUTPUT:  Intake/Output Summary (Last 24 hours) at 02/10/14 0721 Last data filed at 02/10/14 0700  Gross per 24 hour  Intake   1160 ml  Output   4225 ml  Net  -3065 ml    PHYSICAL EXAMINATION: General:  WNWDWM NAD sitting in chair Neuro:  Intact HEENT:  No JVD/LAN rt i j cordis Cardiovascular:  HS RRR Lungs:  Decreased bs bases, nowheeze and basilar crackles Abdomen:  Soft + bs Musculoskeletal:  intact Skin:  Warm and dry  LABS:  CBC  Recent Labs Lab  02/08/14 0500 02/09/14 0410 02/10/14 0310  WBC 20.2* 22.1* 23.2*  HGB 12.8* 10.7* 10.9*  HCT 38.1* 32.3* 32.6*  PLT 203 192 239   Coag's  Recent Labs Lab 02/06/14 1502 02/07/14 1300  APTT 30 31  INR 0.90 1.23   BMET  Recent Labs Lab 02/08/14 0500 02/09/14 0410 02/10/14 0310  NA 138 138 138  K 4.0 4.7 4.0  CL 102 99 97  CO2 23 29 28   BUN 19 20 15   CREATININE 0.53 0.50 0.52  GLUCOSE 167* 152* 169*   Electrolytes  Recent Labs Lab 02/07/14 2000 02/08/14 0500 02/09/14 0410 02/10/14 0310  CALCIUM  --  7.8* 8.1* 8.4  MG 2.8* 2.4  --   --    Sepsis Markers No results found for this basename: LATICACIDVEN, PROCALCITON, O2SATVEN,  in the last 168 hours ABG  Recent Labs Lab 02/07/14 1656 02/08/14 0504 02/08/14 0914  PHART 7.299* 7.385 7.399  PCO2ART 47.6* 41.6 41.0  PO2ART 57.0* 66.0* 58.0*   Liver Enzymes  Recent Labs Lab 02/06/14 1502  AST 15  ALT 8  ALKPHOS 82  BILITOT 0.5  ALBUMIN 4.1   Cardiac Enzymes No results found for this basename: TROPONINI, PROBNP,  in the last 168 hours Glucose  Recent Labs Lab 02/08/14 1554 02/08/14 1946 02/08/14 2333 02/09/14 0342 02/09/14 0744 02/09/14 [place]  122* 140* 128* 195* 145* 209*    Imaging Dg Chest Port 1 View  02/09/2014   CLINICAL DATA:  Postop cardiac surgery.  EXAM: PORTABLE CHEST - 1 VIEW  COMPARISON:  02/08/2014  FINDINGS: Endotracheal and enteric tubes have been removed. Chest tubes and mediastinal drain have been removed. Swan-Ganz catheter has been removed. Right jugular introducer sheath terminates over the upper SVC. Sequelae of prior CABG are again identified. Cardiac silhouette is partially obscured but appears grossly unchanged in size. The lungs are hypoinflated with hazy opacities in both lung bases. There are small bilateral pleural effusions. No pneumothorax is identified.  IMPRESSION: 1. Interval removal of lines and tubes as above. 2. Bibasilar lung opacities, likely  atelectasis, with small bilateral pleural effusions. No pneumothorax.   Electronically Signed   By: Logan Bores   On: 02/09/2014 08:03   CXR-small Bl effusions  ASSESSMENT / PLAN:  PULMONARY OETT*7/30>>7/31 A: Pot CABG with normal extubation COPD as confirmed by PFT's with moderate obstruction. Post cabg atx BL effusions P:   Pulmicort 0.5 bid  Add brovana bid Dc albuterol, ct atrovent q 6h - can be changed to spiriva on discharge Drop Solumedrol 40 q 12h Ct diuresis Pulmonary toilet O2 as needed Mobilize Flutter valve   CARDIOVASCULAR CVL 7/30 rt i j cordis>> A: CAD post CABG x 3 7/30 P:  Per cvts Ct lasix - good response    INFECTIOUS A:   Presumed pna P:    Abx: ceftazime, start date8/1 Abx: *vanc, start date8/1 Simplify in 24h    ENDOCRINE A:   Steroid induced hyperglycemia   P:   Decrease steroids in 24h SSI for glucose >160    TODAY'S SUMMARY: 58 yo former smoker (age 60 -18) (age 60 -18) with 3 vessel CAD who underwent CABG x3 per CVTS on 7/30. Would treat as COPD flare, but suspect effusions playing a role & diuresis will help remains to be seen whether he will need short term oxygen on discharge, although expect him to do well long term.    I have personally obtained a history, examined the patient, evaluated laboratory and imaging results, formulated the assessment and plan and placed orders.  Rigoberto Noel MD 02/10/2014, 7:21 AM

## 2014-02-11 ENCOUNTER — Inpatient Hospital Stay (HOSPITAL_COMMUNITY): Payer: BC Managed Care – PPO

## 2014-02-11 DIAGNOSIS — J189 Pneumonia, unspecified organism: Secondary | ICD-10-CM

## 2014-02-11 LAB — BASIC METABOLIC PANEL
Anion gap: 10 (ref 5–15)
BUN: 27 mg/dL — AB (ref 6–23)
CHLORIDE: 102 meq/L (ref 96–112)
CO2: 30 mEq/L (ref 19–32)
Calcium: 8.2 mg/dL — ABNORMAL LOW (ref 8.4–10.5)
Creatinine, Ser: 0.59 mg/dL (ref 0.50–1.35)
GFR calc Af Amer: >90 mL/min (ref >90)
GFR calc non Af Amer: >90 mL/min (ref >90)
GLUCOSE: 96 mg/dL (ref 70–99)
Potassium: 3.9 mEq/L (ref 3.7–5.3)
Sodium: 142 mEq/L (ref 137–147)

## 2014-02-11 LAB — CBC
HEMATOCRIT: 32 % — AB (ref 39.0–52.0)
HEMOGLOBIN: 10.8 g/dL — AB (ref 13.0–17.0)
MCH: 31.9 pg (ref 26.0–34.0)
MCHC: 33.8 g/dL (ref 30.0–36.0)
MCV: 94.4 fL (ref 78.0–100.0)
Platelets: 264 10*3/uL (ref 150–400)
RBC: 3.39 MIL/uL — ABNORMAL LOW (ref 4.22–5.81)
RDW: 13.3 % (ref 11.5–15.5)
WBC: 17.1 10*3/uL — AB (ref 4.0–10.5)

## 2014-02-11 LAB — GLUCOSE, CAPILLARY
GLUCOSE-CAPILLARY: 82 mg/dL (ref 70–99)
Glucose-Capillary: 94 mg/dL (ref 70–99)
Glucose-Capillary: 124 mg/dL — ABNORMAL HIGH (ref 70–99)
Glucose-Capillary: 137 mg/dL — ABNORMAL HIGH (ref 70–99)

## 2014-02-11 MED ORDER — ALUM & MAG HYDROXIDE-SIMETH 200-200-20 MG/5ML PO SUSP: 15.0000 mL | ORAL | Status: DC | PRN

## 2014-02-11 MED ORDER — TRAMADOL HCL 50 MG PO TABS
50.0000 mg | ORAL_TABLET | ORAL | Status: DC | PRN
  Administered 2014-02-11: 100 mg via ORAL
  Filled 2014-02-11: qty 2

## 2014-02-11 MED ORDER — LISINOPRIL 20 MG PO TABS
20.0000 mg | ORAL_TABLET | Freq: Every day | ORAL | Status: DC
  Administered 2014-02-11 – 2014-02-13 (×3): 20 mg via ORAL
  Filled 2014-02-11 (×3): qty 1

## 2014-02-11 MED ORDER — SODIUM CHLORIDE 0.9 % IJ SOLN: 3.0000 mL | INTRAMUSCULAR | Status: DC | PRN

## 2014-02-11 MED ORDER — PREDNISONE 20 MG PO TABS
30.0000 mg | ORAL_TABLET | Freq: Every day | ORAL | Status: DC
  Administered 2014-02-11 – 2014-02-12 (×2): 30 mg via ORAL
  Filled 2014-02-11 (×3): qty 1

## 2014-02-11 MED ORDER — IPRATROPIUM BROMIDE 0.02 % IN SOLN
0.5000 mg | Freq: Four times a day (QID) | RESPIRATORY_TRACT | Status: DC
  Administered 2014-02-11 – 2014-02-12 (×3): 0.5 mg via RESPIRATORY_TRACT
  Filled 2014-02-11 (×4): qty 2.5

## 2014-02-11 MED ORDER — MAGNESIUM HYDROXIDE 400 MG/5ML PO SUSP: 30.0000 mL | Freq: Every day | ORAL | Status: DC | PRN

## 2014-02-11 MED ORDER — HYDROCHLOROTHIAZIDE 25 MG PO TABS
25.0000 mg | ORAL_TABLET | Freq: Every day | ORAL | Status: DC
  Administered 2014-02-11 – 2014-02-13 (×3): 25 mg via ORAL
  Filled 2014-02-11 (×3): qty 1

## 2014-02-11 MED ORDER — SODIUM CHLORIDE 0.9 % IJ SOLN
3.0000 mL | Freq: Two times a day (BID) | INTRAMUSCULAR | Status: DC
  Administered 2014-02-11 – 2014-02-13 (×5): 3 mL via INTRAVENOUS

## 2014-02-11 MED ORDER — LISINOPRIL-HYDROCHLOROTHIAZIDE 20-25 MG PO TABS: 1.0000 | ORAL_TABLET | Freq: Every day | ORAL | Status: DC

## 2014-02-11 MED ORDER — SODIUM CHLORIDE 0.9 % IV SOLN: 250.0000 mL | INTRAVENOUS | Status: DC | PRN

## 2014-02-11 MED ORDER — MOVING RIGHT ALONG BOOK
Freq: Once | Status: AC
  Administered 2014-02-11: 12:00:00
  Filled 2014-02-11: qty 1

## 2014-02-11 MED ORDER — LEVOFLOXACIN 750 MG PO TABS
750.0000 mg | ORAL_TABLET | Freq: Every day | ORAL | Status: DC
  Administered 2014-02-12 – 2014-02-13 (×2): 750 mg via ORAL
  Filled 2014-02-11 (×2): qty 1

## 2014-02-11 NOTE — Progress Notes (Signed)
Pt transferred to 2w38 from 2s; pt alert and oriented; pt assisted to chair; VSS; pt denies pain at this time; O2 down to 3L [place]; sats 97% 4L upon entering room; will cont. To monitor.

## 2014-02-11 NOTE — Progress Notes (Signed)
R IJ central line d/c at this time; manual pressure applied; dressing applied; bedrest until 1630; will cont. To monitor.

## 2014-02-11 NOTE — Progress Notes (Signed)
CARDIAC REHAB PHASE I   PRE:  Rate/Rhythm: 74 SR    BP: sitting 100/67    SaO2: 95 3L  MODE:  Ambulation: 550 ft   POST:  Rate/Rhythm: 87 SR    BP: sitting 113/75     SaO2: 94 2L  Pt very willing to walk. Used 2L and SaO2 maintained 92-94 2l. Used RW, assist x1 for equipment. Had to stop to cough x3, congested cough, unproductive. C/o significant pain with coughing. To bed after walk for nap. This was third walk today. Will f/u am. [phone]   Darrick Meigs CES, ACSM 02/11/2014 3:34 PM

## 2014-02-11 NOTE — Progress Notes (Signed)
PULMONARY / CRITICAL CARE MEDICINE   Name: Carl Lee MRN: [id number] DOB: Apr 13, 1956    ADMISSION DATE:  02/07/2014 CONSULTATION DATE:  8/1  REFERRING MD :  CVTS  CHIEF COMPLAINT:  Wheezes  STUDIES:    SIGNIFICANT EVENTS: 7/30 cabg x 3   HISTORY OF PRESENT ILLNESS:   58 yo smoker (age 58 -33) with 3 vessel CAD who underwent CABG x3 per CVTS on 7/30. He was extubated and is sitting up in chair. + for wheezing and some concern for pna by c x r(suspect more due to atx than infection)elevated wbc. He has been placed on systemic steroids and abx for this presume infection. PCCM has been tasked with pulmonary optimization as of 8/1. PMH - hyperlipidemia, hypercholesterolemia ,He has a hx of bladder ca with Reoccurrence., pre-op myoview positive, cath showed LAD total occlusion, RCA 80% stenosis. 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     SUBJECTIVE:  Feels OK today, breathing improving, cough persists but better  VITAL SIGNS: Temp:  [97.3 F (36.3 C)-98 F (36.7 C)] 97.3 F (36.3 C) (08/03 1215) Pulse Rate:  [65-95] 75 (08/03 1228) Resp:  [14-24] 14 (08/03 1228) BP: (96-141)/(53-99) 114/75 mmHg (08/03 1215) SpO2:  [91 %-98 %] 96 % (08/03 1228) Weight:  [89.2 kg (196 lb 10.4 oz)] 89.2 kg (196 lb 10.4 oz) (08/03 0600) HEMODYNAMICS:   VENTILATOR SETTINGS:   INTAKE / OUTPUT:  Intake/Output Summary (Last 24 hours) at 02/11/14 1350 Last data filed at 02/11/14 1348  Gross per 24 hour  Intake   1600 ml  Output   2475 ml  Net   -875 ml    PHYSICAL EXAMINATION: General:  Sitting up in chair, comfortable HEENT: NCAT, PERRL PULM: wheezing bilaterally, dminished bases CV: RRR, no mgr Ab: BS+, soft, nontender Ext: no edema, warm  LABS:  CBC  Recent Labs Lab 02/09/14 0410 02/10/14 0310 02/11/14 0435  WBC 22.1* 23.2* 17.1*  HGB 10.7* 10.9* 10.8*  HCT 32.3* 32.6* 32.0*  PLT 192 239 264    Coag's  Recent Labs Lab 02/06/14 1502 02/07/14 1300  APTT 30 31  INR 0.90 1.23   BMET  Recent Labs Lab 02/09/14 0410 02/10/14 0310 02/11/14 0435  NA 138 138 142  K 4.7 4.0 3.9  CL 99 97 102  CO2 29 28 30   BUN 20 15 27*  CREATININE 0.50 0.52 0.59  GLUCOSE 152* 169* 96   Electrolytes  Recent Labs Lab 02/07/14 2000 02/08/14 0500 02/09/14 0410 02/10/14 0310 02/11/14 0435  CALCIUM  --  7.8* 8.1* 8.4 8.2*  MG 2.8* 2.4  --   --   --    Sepsis Markers No results found for this basename: LATICACIDVEN, PROCALCITON, O2SATVEN,  in the last 168 hours ABG  Recent Labs Lab 02/07/14 1656 02/08/14 0504 02/08/14 0914  PHART 7.299* 7.385 7.399  PCO2ART 47.6* 41.6 41.0  PO2ART 57.0* 66.0* 58.0*   Liver Enzymes  Recent Labs Lab 02/06/14 1502  AST 15  ALT 8  ALKPHOS 82  BILITOT 0.5  ALBUMIN 4.1   Cardiac Enzymes No results found for this basename: TROPONINI, PROBNP,  in the last 168 hours Glucose  Recent Labs Lab 02/10/14 0757 02/10/14 1234 02/10/14 1550 02/10/14 2129 02/11/14 0749 02/11/14 1211  GLUCAP 149* 126* 146* 137* 82 94    Imaging Dg Chest Port 1 View  02/10/2014   CLINICAL DATA:  Heavy wheezing started during breathing treatment.  EXAM: PORTABLE CHEST - 1 VIEW  COMPARISON:  02/09/2014  FINDINGS: Postoperative changes in the mediastinum. Right central venous catheter with tip over the upper SVC region. No pneumothorax. Shallow inspiration. Atelectasis or infiltration demonstrated in both lung bases. No significant change since previous study.  IMPRESSION: Shallow inspiration with atelectasis or infiltration in the lung bases, unchanged.   Electronically Signed   By: Lucienne Capers M.D.   On: 02/10/2014 03:36   CXR-small Bl effusions  ASSESSMENT / PLAN:  PULMONARY OETT*7/30>>7/31 A: Pot CABG with normal extubation COPD as confirmed by PFT's with moderate obstruction. Post cabg atx BL effusions P:   Continue pulmicort/brovana today >  switch to [facility] tomorrow Prednisone today ipratropium q6h Ct diuresis Pulmonary toilet O2 as needed Mobilize Flutter valve   CARDIOVASCULAR CVL 7/30 rt i j cordis>> A: CAD post CABG x 3 7/30 P:  Per cvts Ct lasix - good response    INFECTIOUS A:   Presumed pna > improving P:    D/c IV ABX Start Levaquin   ENDOCRINE A:   Steroid induced hyperglycemia   P:   Decrease steroids in 24h SSI for glucose >160    TODAY'S SUMMARY: 58 yo former smoker (age 58 -7) with 3 vessel CAD who underwent CABG x3 per CVTS on 7/30. Would treat as COPD flare, but suspect effusions playing a role & diuresis will help remains to be seen whether he will need short term oxygen on discharge, although expect him to do well long term.    I have personally obtained a history, examined the patient, evaluated laboratory and imaging results, formulated the assessment and plan and placed orders.  Roselie Awkward, MD [place] [facility] Pager: [phone] Cell: [phone] If no response, call [phone]

## 2014-02-11 NOTE — Progress Notes (Addendum)
4 Days Post-Op Procedure(s) (LRB): CORONARY ARTERY BYPASS GRAFTING (CABG) (N/A) INTRAOPERATIVE TRANSESOPHAGEAL ECHOCARDIOGRAM (N/A) Subjective: Some pain after coughing  Objective: Vital signs in last 24 hours: Temp:  [97.3 F (36.3 C)-98 F (36.7 C)] 97.3 F (36.3 C) (08/03 0403) Pulse Rate:  [65-102] 78 (08/03 0700) Cardiac Rhythm:  [-] Normal sinus rhythm (08/03 0400) Resp:  [13-24] 20 (08/03 0700) BP: (96-143)/(53-99) 132/83 mmHg (08/03 0700) SpO2:  [91 %-98 %] 96 % (08/03 0700) Weight:  [196 lb 10.4 oz (89.2 kg)] 196 lb 10.4 oz (89.2 kg) (08/03 0600)  Hemodynamic parameters for last 24 hours:    Intake/Output from previous day: 08/02 0701 - 08/03 0700 In: 1390 [P.O.:240; I.V.:400; IV Piggyback:750] Out: 3125 [Urine:3125] Intake/Output this shift:    General appearance: alert and no distress Neurologic: intact Heart: regular rate and rhythm Lungs: wheezes faint bilaterally Wound: serous drainage from right leg incision  Lab Results:  Recent Labs  02/10/14 0310 02/11/14 0435  WBC 23.2* 17.1*  HGB 10.9* 10.8*  HCT 32.6* 32.0*  PLT 239 264   BMET:  Recent Labs  02/10/14 0310 02/11/14 0435  NA 138 142  K 4.0 3.9  CL 97 102  CO2 28 30  GLUCOSE 169* 96  BUN 15 27*  CREATININE 0.52 0.59  CALCIUM 8.4 8.2*    PT/INR: No results found for this basename: LABPROT, INR,  in the last 72 hours ABG    Component Value Date/Time   PHART 7.399 02/08/2014 0914   HCO3 25.3* 02/08/2014 0914   TCO2 27 02/08/2014 0914   ACIDBASEDEF 3.0* 02/07/2014 1656   O2SAT 89.0 02/08/2014 0914   CBG (last 3)   Recent Labs  02/10/14 1234 02/10/14 1550 02/10/14 2129  GLUCAP 126* 146* 137*    Assessment/Plan: S/P Procedure(s) (LRB): CORONARY ARTERY BYPASS GRAFTING (CABG) (N/A) INTRAOPERATIVE TRANSESOPHAGEAL ECHOCARDIOGRAM (N/A) Plan for transfer to step-down: see transfer orders CV- stable  RESP- pulmonary status continues to slowly improve  RENAL- continues to diurese  well  CBG well controlled  ID- day 3 Iv vancomycin and fortaz for presumed pneumonia  Ambulation improving  Transfer to 2000   LOS: 4 days    [name],STEVEN C 02/11/2014

## 2014-02-11 NOTE — Care Management Note (Signed)
    Page 1 of 1   02/11/2014     2:31:37 PM CARE MANAGEMENT NOTE 02/11/2014  Patient:  Carl Lee, Carl Lee   Account Number:  [id number]  Date Initiated:  02/11/2014  Documentation initiated by:  Geanette Buonocore  Subjective/Objective Assessment:   s/p CABG; lives with spouse     Action/Plan:   Anticipated DC Date:     Anticipated DC Plan:        DC Planning Services  CM consult      Choice offered to / List presented to:             Status of service:   Medicare Important Message given?   (If response is "NO", the following Medicare IM given date fields will be blank) Date Medicare IM given:   Medicare IM given by:   Date Additional Medicare IM given:   Additional Medicare IM given by:    Discharge Disposition:    Per UR Regulation:  Reviewed for med. necessity/level of care/duration of stay  If discussed at Long Length of Stay Meetings, dates discussed:    Comments:  02/11/14 Rosalia MSN BSN CCM Met with pt and sister @ bedside.  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  States his income has been drastically cut because of his inability to work and his wife has never worked outside the home. Discussed SS disability and MCD applications, sister states she will assist pt.

## 2014-02-12 ENCOUNTER — Inpatient Hospital Stay (HOSPITAL_COMMUNITY): Payer: BC Managed Care – PPO

## 2014-02-12 ENCOUNTER — Telehealth: Payer: Self-pay | Admitting: *Deleted

## 2014-02-12 LAB — GLUCOSE, CAPILLARY
GLUCOSE-CAPILLARY: 122 mg/dL — AB (ref 70–99)
GLUCOSE-CAPILLARY: 132 mg/dL — AB (ref 70–99)
Glucose-Capillary: 108 mg/dL — ABNORMAL HIGH (ref 70–99)
Glucose-Capillary: 127 mg/dL — ABNORMAL HIGH (ref 70–99)

## 2014-02-12 LAB — BASIC METABOLIC PANEL
Anion gap: 12 (ref 5–15)
BUN: 23 mg/dL (ref 6–23)
CO2: 30 meq/L (ref 19–32)
CREATININE: 0.61 mg/dL (ref 0.50–1.35)
Calcium: 8.6 mg/dL (ref 8.4–10.5)
Chloride: 99 mEq/L (ref 96–112)
GFR calc Af Amer: >90 mL/min (ref >90)
GLUCOSE: 121 mg/dL — AB (ref 70–99)
Potassium: 4.7 mEq/L (ref 3.7–5.3)
SODIUM: 141 meq/L (ref 137–147)

## 2014-02-12 LAB — CBC
HCT: 36.7 % — ABNORMAL LOW (ref 39.0–52.0)
HEMOGLOBIN: 12.3 g/dL — AB (ref 13.0–17.0)
MCH: 31.4 pg (ref 26.0–34.0)
MCHC: 33.5 g/dL (ref 30.0–36.0)
MCV: 93.6 fL (ref 78.0–100.0)
Platelets: 330 10*3/uL (ref 150–400)
RBC: 3.92 MIL/uL — AB (ref 4.22–5.81)
RDW: 13.1 % (ref 11.5–15.5)
WBC: 15.5 10*3/uL — ABNORMAL HIGH (ref 4.0–10.5)

## 2014-02-12 MED ORDER — MOMETASONE FURO-FORMOTEROL FUM 100-5 MCG/ACT IN AERO
2.0000 | INHALATION_SPRAY | Freq: Two times a day (BID) | RESPIRATORY_TRACT | Status: DC
  Administered 2014-02-12 – 2014-02-13 (×2): 2 via RESPIRATORY_TRACT
  Filled 2014-02-12: qty 8.8

## 2014-02-12 MED ORDER — LEVALBUTEROL TARTRATE 45 MCG/ACT IN AERO: 6.0000 | INHALATION_SPRAY | RESPIRATORY_TRACT | Status: DC | PRN

## 2014-02-12 MED ORDER — LEVALBUTEROL HCL 1.25 MG/0.5ML IN NEBU
1.2500 mg | INHALATION_SOLUTION | RESPIRATORY_TRACT | Status: DC | PRN
  Filled 2014-02-12: qty 0.5

## 2014-02-12 MED ORDER — PREDNISONE 20 MG PO TABS
20.0000 mg | ORAL_TABLET | Freq: Every day | ORAL | Status: DC
  Administered 2014-02-13: 20 mg via ORAL
  Filled 2014-02-12 (×2): qty 1

## 2014-02-12 NOTE — Progress Notes (Addendum)
[address]       [place] [address]             [phone]      5 Days Post-Op Procedure(s) (LRB): CORONARY ARTERY BYPASS GRAFTING (CABG) (N/A) INTRAOPERATIVE TRANSESOPHAGEAL ECHOCARDIOGRAM (N/A) Subjective: Feeling well, not SOB, minimal cough/sputum, ambulating well  Objective: Vital signs in last 24 hours: Temp:  [97.3 F (36.3 C)-97.7 F (36.5 C)] 97.3 F (36.3 C) (08/04 0510) Pulse Rate:  [65-91] 65 (08/04 0510) Cardiac Rhythm:  [-] Normal sinus rhythm (08/03 2324) Resp:  [14-19] 18 (08/04 0510) BP: (109-129)/(58-80) 111/80 mmHg (08/04 0510) SpO2:  [94 %-97 %] 94 % (08/04 0510) Weight:  [191 lb 9.3 oz (86.9 kg)] 191 lb 9.3 oz (86.9 kg) (08/04 0145)  Hemodynamic parameters for last 24 hours:    Intake/Output from previous day: 08/03 0701 - 08/04 0700 In: 760 [P.O.:480; I.V.:30; IV Piggyback:250] Out: 2600 [Urine:2600] Intake/Output this shift:    General appearance: alert, cooperative and no distress Heart: regular rate and rhythm Lungs: clear to auscultation bilaterally Abdomen: benign Extremities: no edema Wound: incis healing well  Lab Results:  Recent Labs  02/11/14 0435 02/12/14 0500  WBC 17.1* 15.5*  HGB 10.8* 12.3*  HCT 32.0* 36.7*  PLT 264 330   BMET:  Recent Labs  02/11/14 0435 02/12/14 0500  NA 142 141  K 3.9 4.7  CL 102 99  CO2 30 30  GLUCOSE 96 121*  BUN 27* 23  CREATININE 0.59 0.61  CALCIUM 8.2* 8.6    PT/INR: No results found for this basename: LABPROT, INR,  in the last 72 hours ABG    Component Value Date/Time   PHART 7.399 02/08/2014 0914   HCO3 25.3* 02/08/2014 0914   TCO2 27 02/08/2014 0914   ACIDBASEDEF 3.0* 02/07/2014 1656   O2SAT 89.0 02/08/2014 0914   CBG (last 3)   Recent Labs  02/11/14 1633 02/11/14 2101 02/12/14 0613  GLUCAP 124* 137* 108*   Scheduled Meds: . acetaminophen  1,000 mg Oral 4 times per day   Or  . acetaminophen (TYLENOL) oral liquid 160 mg/5 mL  1,000 mg Per Tube  4 times per day  . arformoterol  15 mcg Nebulization BID  . aspirin EC  325 mg Oral Daily   Or  . aspirin  324 mg Per Tube Daily  . bisacodyl  10 mg Oral Daily   Or  . bisacodyl  10 mg Rectal Daily  . budesonide (PULMICORT) nebulizer solution  0.5 mg Nebulization BID  . docusate sodium  200 mg Oral Daily  . enoxaparin (LOVENOX) injection  40 mg Subcutaneous QHS  . furosemide  40 mg Oral Daily  . guaiFENesin  1,200 mg Oral BID  . hydrochlorothiazide  25 mg Oral Daily  . insulin aspart  0-15 Units Subcutaneous TID WC  . ipratropium  0.5 mg Nebulization Q6H  . levofloxacin  750 mg Oral Daily  . lisinopril  20 mg Oral Daily  . metoprolol tartrate  25 mg Oral BID   Or  . metoprolol tartrate  25 mg Per Tube BID  . mupirocin ointment  1 application Nasal BID  . pantoprazole  40 mg Oral Daily  . potassium chloride  20 mEq Oral BID  . predniSONE  30 mg Oral Q breakfast  . sodium chloride  3 mL Intravenous Q12H   Continuous Infusions:  PRN Meds:.sodium chloride, alum & mag hydroxide-simeth, LORazepam, magnesium hydroxide, ondansetron (ZOFRAN) IV, oxyCODONE, sodium chloride,  traMADol  Dg Chest 2 View  02/12/2014   CLINICAL DATA:  Shortness of breath.  EXAM: CHEST  2 VIEW  COMPARISON:  02/11/2014.02/07/2014.  FINDINGS: Interim removal of NG tube and right IJ sheath. Mediastinum and hilar structures are normal. Prior CABG. Pulmonary vascularity normal. Mild bibasilar atelectasis and small pleural effusions are present. No pneumothorax. No acute osseus abnormality.  IMPRESSION: 1. Interim removal of NG tube and right IJ sheath. 2. Mild bibasilar atelectasis and small pleural effusions. 3. Prior CABG.  Heart size and pulmonary vascularity normal.   Electronically Signed   By: Hunker   On: 02/12/2014 08:08   Dg Chest Port 1 View  02/11/2014   CLINICAL DATA:  Post CABG.  EXAM: PORTABLE CHEST - 1 VIEW  COMPARISON:  02/10/2014.  FINDINGS: Right central line tip proximal superior vena cava  level.  Structure overlying the right lateral aspect of the trachea appears to be outside lead rather than the nasogastric tube or endotracheal tube.  Post CABG.  Heart size top-normal.  Bilateral pleural effusions/basal atelectasis. Limited for excluding basilar infiltrate or mass.  Mild central pulmonary vascular prominence.  No gross pneumothorax.  Left acromioclavicular joint degenerative changes.  IMPRESSION: Post CABG. Heart size top-normal.  Bilateral pleural effusions/basal atelectasis. Limited for excluding basilar infiltrate or mass.   Electronically Signed   By: Chauncey Cruel M.D.   On: 02/11/2014 07:04    Assessment/Plan: S/P Procedure(s) (LRB): CORONARY ARTERY BYPASS GRAFTING (CABG) (N/A) INTRAOPERATIVE TRANSESOPHAGEAL ECHOCARDIOGRAM (N/A)  1 doing well 2 pulm status cons to improve- cont to wean O2, ? Taper prednisone 3 hemodyn stable, occ brady to 50's 4 labs stable   LOS: 5 days    GOLD,WAYNE E 02/12/2014  Continues to improve. Looks much better today, down to 1 L [place] Will plan to taper off prednisone slowly

## 2014-02-12 NOTE — Discharge Summary (Signed)
Physician Discharge Summary  Patient ID: ESAIAS CLEAVENGER MRN: [id number] DOB/AGE: [birth year] 58 y.o.  Admit date: 02/07/2014 Discharge date: 02/12/2014  Admission Diagnoses: Severe coronary artery disease   HPI:  Mr. Malone is a 58 year old gentleman with a history of hyperlipidemia, hypercholesterolemia, tobacco abuse and a positive family history for coronary disease. He also has a history of bladder cancer. He had a transurethral resection in 2010. He recently was diagnosed with a recurrence. During his preoperative evaluation he was noted to have an abnormal EKG. A repeat EKG showed the same Q waves in V1 and 2. He was referred for cardiology evaluation. Given his history of abdominal aortic aneurysm and carotid disease and multiple cardiac risk factors a stress Myoview was done. The Myoview showed a large fixed defect involving the entire inferior and anteroseptum and mid and distal anterior wall and distal inferior wall and apex with no ischemia. He was referred for cardiac catheterization.  Dr. Angelena Form performed cardiac catheterization on 02/01/2014. That revealed total occlusion of his LAD and right coronary and an 80% stenosis in a left circumflex giving rise to 3 large obtuse marginal branches. Ejection fraction was 45-50%.    Past Medical History   Diagnosis  Date   .  Bladder cancer  08/16/2008     [facility] then [place] now [place])   .  Hypertension    .  AAA (abdominal aortic aneurysm)  03/25/12     s/p endovascular repair   .  Hx of migraines    .  Contact dermatitis      atypical [name])   .  Arthritis    .  Childhood asthma    .  Environmental allergies      improved as ages   .  Diverticulosis  2013     severe by CT and colonoscopy   .  Adenomatous polyp  06/2009   .  Lumbosacral radiculopathy at S1  03/2012     left, with spinal stenosis (MRI 04/2013) improved with TF ESI L5/S1 and S1/2 [name])    Past Surgical History   Procedure  Laterality  Date   .   Tonsillectomy and adenoidectomy   1973   .  Vasectomy     .  Bladder cancer       [facility])   .  Abdominal aortic aneurysm repair   03/25/12   .  Colonoscopy   06/2012     hyperplastic polyp, diverticulosis ([facility]) rec rpt 5 yrs   .  Cystoscopy   08/16/08     Bladder Cancer   .  Esi   04/2013, 06/2013     L L5S1, S12 transforaminal ESI (Dr. [name])   .  Abi   05/2013     WNL, L TBI low at 0.66    Family History   Problem  Relation  Age of Onset   .  Diabetes  Mother    .  Arrhythmia  Mother      pacemaker   .  Cancer  Mother      Bladder   .  COPD  Mother    .  Thyroid disease  Mother    .  Hyperlipidemia  Mother    .  Hypertension  Mother    .  Diabetes  Father    .  CAD  Father  58     CHF, MI   .  Dementia  Father    .  Diabetes  Sister    .  Colon cancer  Neg Hx    .  Diabetes  Brother    .  Hypertension  Brother     Social History  History   Substance Use Topics   .  Smoking status:  Current Every Day Smoker -- 0.50 packs/day for 30 years     Types:  Cigarettes   .  Smokeless tobacco:  Never Used      Comment: pt states that he is trying to quit   .  Alcohol Use:  Yes      Comment: Rare    Current Outpatient Prescriptions   Medication  Sig  Dispense  Refill   .  acetaminophen (TYLENOL) 500 MG tablet  Take 500 mg by mouth every 6 (six) hours as needed for pain.     [name]  aspirin 81 MG tablet  Take 81 mg by mouth daily.     .  Glucosamine-Chondroit-Vit C-Mn (GLUCOSAMINE 1500 COMPLEX PO)  Take 2 tablets by mouth daily.     [name]  lisinopril-hydrochlorothiazide (PRINZIDE,ZESTORETIC) 20-25 MG per tablet  Take 1 tablet by mouth daily.  90 tablet  3   .  LORazepam (ATIVAN) 0.5 MG tablet  Take 1 tablet (0.5 mg total) by mouth 2 (two) times daily as needed for anxiety.  20 tablet  0   .  Multiple Vitamins-Minerals (CENTRUM SILVER ADULT 50+ PO)  Take 1 capsule by mouth daily.      No current facility-administered medications for this visit.    Allergies   Allergen   Reactions   .  Codeine  Other (See Comments)     Nightmares     Discharge Diagnoses:  Active Problems:   Smoker   Hypertension   Bladder cancer   Abdominal aneurysm without mention of rupture   S/P CABG x 3   Unstable angina Post op COPD exacerbation/possible pneumonia   Discharged Condition: good   Hospital Course:   Dr. Angelena Form performed cardiac catheterization on 02/01/2014. That revealed total occlusion of his LAD and right coronary and an 80% stenosis and a circumflex giving rise to 3 large obtuse marginal branches. Ejection fraction was 45-50%. Surgical consultation was obtained with Modesto Charon M.D. who evaluated the studies and agree with recommendations to proceed with surgical revascularization. He was taken to the operating room on 02/07/2014 for  coronary bypass grafting x3. He tolerated the procedure well was taken to the surgical intensive care he in stable condition. He was initially extubated by protocol but did require reintubation to 2 and increased secretions, tachypnea, and hypercapnia. Additionally, pulmonary medicine consultation was obtained to assist with management. He required aggressive pulmonary toilet. Nebulizers, and steroids. He showed excellent recovery in this regard. All routine lines, monitors and drainage devices have been discontinued in the standard fashion. He is tolerating gradually increasing activities using standard protocols. Incisions are healing well without evidence of infection. He has remained in sinus rhythm. He has only a mild postoperative anemia. His overall status is felt to be tentatively stable for discharge in the next 24-48 hours pending ongoing reevaluation of his condition.  Consults: pulmonary/intensive care  Significant Diagnostic Studies: Cardiac catheterization   Treatments: DATE OF PROCEDURE: 02/07/2014  DATE OF DISCHARGE:  OPERATIVE REPORT  PREOPERATIVE DIAGNOSIS: Severe 3-vessel coronary disease.  POSTOPERATIVE  DIAGNOSIS: Severe 3-vessel coronary disease.  PROCEDURE: Median sternotomy, extracorporeal circulation, coronary  artery bypass grafting x3 (left internal mammary artery to LAD, right  internal mammary artery to obtuse marginal to the transverse sinus,  saphenous  vein graft to posterior descending), endoscopic vein harvest,  right thigh.  SURGEON: Revonda Standard. [name], M.D.  ASSISTANTS: John Giovanni, P.A.-C.  ANESTHESIA: General.  FINDINGS: LAD 1.5 mm, fair quality target. Posterior descending 1 mm,  poor-quality target. OM 2 good quality target. Mammaries and saphenous  vein good quality. Transesophageal echocardiography revealed good left  ventricular function with no significant valvular pathology  Discharge Exam: Blood pressure 111/80, pulse 65, temperature 97.3 F (36.3 C), temperature source Oral, resp. rate 18, height 5\' 11"  (1.803 m), weight 191 lb 9.3 oz (86.9 kg), SpO2 94.00%.  General appearance: alert, cooperative and no distress  Heart: regular rate and rhythm  Lungs: clear to auscultation bilaterally  Abdomen: benign  Extremities: no edema  Wound: incis healing well    Disposition: 01-Home or Self Care   Medications at time of discharge:   Medication List    STOP taking these medications       acetaminophen 500 MG tablet  Commonly known as:  TYLENOL     aspirin 81 MG tablet  Replaced by:  aspirin 325 MG EC tablet      TAKE these medications       aspirin 325 MG EC tablet  Take 1 tablet (325 mg total) by mouth daily.     atorvastatin 20 MG tablet  Commonly known as:  LIPITOR  Take 1 tablet (20 mg total) by mouth daily at 6 PM.  Start taking on:  02/14/2014     CENTRUM SILVER ADULT 50+ PO  Take 1 capsule by mouth daily.     GLUCOSAMINE 1500 COMPLEX PO  Take 2 tablets by mouth daily.     levofloxacin 750 MG tablet  Commonly known as:  LEVAQUIN  Take 1 tablet (750 mg total) by mouth daily.     lisinopril-hydrochlorothiazide 20-25 MG per tablet   Commonly known as:  PRINZIDE,ZESTORETIC  Take 1 tablet by mouth daily.     LORazepam 0.5 MG tablet  Commonly known as:  ATIVAN  Take 1 tablet (0.5 mg total) by mouth 2 (two) times daily as needed for anxiety.     metoprolol tartrate 25 MG tablet  Commonly known as:  LOPRESSOR  Take 1 tablet (25 mg total) by mouth 2 (two) times daily.     mometasone-formoterol 100-5 MCG/ACT Aero  Commonly known as:  DULERA  Inhale 2 puffs into the lungs 2 (two) times daily.     oxyCODONE 5 MG immediate release tablet  Commonly known as:  Oxy IR/ROXICODONE  Take 1-2 tablets (5-10 mg total) by mouth every 4 (four) hours as needed for moderate pain.     predniSONE 5 MG tablet  Commonly known as:  DELTASONE  Take 20 mg for 3 days;  followed by 10 mg for 3 days; followed by 5 mg for 3 days; then stop        Followup:The patient has been discharged on: Follow-up Information   Follow up with [name], MD. (03/19/2014 at 11:45 AM to see the surgeon. Please obtain a chest x-ray at [facility] 1 hour prior to this appointment. [facility] is located in the same office complex.)    Specialty:  Cardiothoracic Surgery   Contact information:   [address] [place] [address] [phone]       Follow up with Richardson Dopp, PA-C. (03/06/2014 11:50 AM to see the cardiology PA)    Specialty:  Physician Assistant   Contact information:   [address] [place] [address] [phone]       1.Beta Blocker:  Yes [ y  ]                              No   [   ]                              If No, reason:  2.Ace Inhibitor/ARB: Yes Blue.Reese   ]                                     No  [    ]                                     If No, reason:  3.Statin:   Yes [ y  ]                  No  [   ]                  If No, reason:  4.Ecasa:  Yes  [ y  ]                  No   [   ]                  If No, reason:   Signed: GOLD,WAYNE E 02/12/2014,  8:47 AM

## 2014-02-12 NOTE — Telephone Encounter (Signed)
Called and spoke with pts spouse and she stated that the pt is still in the hospital at this time and she will call back once he is d/c and make his appt with BQ for [place].  Nothing further is needed.

## 2014-02-12 NOTE — Telephone Encounter (Signed)
Message copied by Inge Rise on Tue Feb 12, 2014  2:02 PM ------      Message from: Juanito Doom      Created: Tue Feb 12, 2014 12:31 PM       Hi,            Please arrange f/u with either me or [facility] in [place] in 4-6 weeks            Thanks      B ------

## 2014-02-12 NOTE — Discharge Instructions (Signed)
Endoscopic Saphenous Vein Harvesting Care After Refer to this sheet in the next few weeks. These instructions provide you with information on caring for yourself after your procedure. Your health care provider may also give you more specific instructions. Your treatment has been planned according to current medical practices, but problems sometimes occur. Call your health care provider if you have any problems or questions after your procedure. HOME CARE INSTRUCTIONS Medicine  Take whatever pain medicine your surgeon prescribes. Follow the directions carefully. Do not take over-the-counter pain medicine unless your surgeon says it is okay. Some pain medicine can cause bleeding problems for several weeks after surgery.  Follow your surgeon's instructions about driving. You will probably not be permitted to drive after heart surgery.  Take any medicines your surgeon prescribes. Any medicines you took before your heart surgery should be checked with your health care provider before you start taking them again. Wound care  If your surgeon has prescribed an elastic bandage or stocking, ask how long you should wear it.  Check the area around your surgical cuts (incisions) whenever your bandages (dressings) are changed. Look for any redness or swelling.  You will need to return to have the stitches (sutures) or staples taken out. Ask your surgeon when to do that.  Ask your surgeon when you can shower or bathe. Activity  Try to keep your legs raised when you are sitting.  Do any exercises your health care providers have given you. These may include deep breathing exercises, coughing, walking, or other exercises. SEEK MEDICAL CARE IF:  You have any questions about your medicines.  You have more leg pain, especially if your pain medicine stops working.  New or growing bruises develop on your leg.  Your leg swells, feels tight, or becomes red.  You have numbness in your leg. SEEK IMMEDIATE  MEDICAL CARE IF:  Your pain gets much worse.  Blood or fluid leaks from any of the incisions.  Your incisions become warm, swollen, or red.  You have chest pain.  You have trouble breathing.  You have a fever.  You have more pain near your leg incision. MAKE SURE YOU:  Understand these instructions.  Will watch your condition.  Will get help right away if you are not doing well or get worse. Document Released: 03/10/2011 Document Revised: 07/03/2013 Document Reviewed: 03/10/2011 [facility] Patient Information 2015 [place], [place]. This information is not intended to replace advice given to you by your health care provider. Make sure you discuss any questions you have with your health care provider. Coronary Artery Bypass Grafting  Coronary artery bypass grafting (CABG) is a surgery that is done when arteries of the heart have become narrow or blocked with a fatty, waxy buildup. These arteries supply the heart with oxygen and nutrients it needs to pump blood to your body. During CABG, a section of blood vessel from another part of the body is taken and placed where there is narrowing or blocking. BEFORE THE PROCEDURE  Take all medicines as told by your doctor. You may be asked to start new medicines or stop others. Do not stop medicines or take different amounts on your own.  Do not eat or drink anything after midnight on the night before the surgery or as told by your doctor. Ask your doctor if it is okay to take a sip of water with any needed medicines. PROCEDURE  There are two ways of performing this surgery. Traditional open surgery  You will be given medicine  to make you sleep through the surgery (general anesthetic).  Once you are sleeping, a cut (incision) is made down the front of the chest through your breastbone. Your breastbone is spread open so your heart can be seen.  You are then placed on a heart-lung bypass machine. This machine gives oxygen to your blood while the  heart is being worked on.  Your heart is then stopped for a short amount of time. This is so Engineer, production can do the next steps.  Part of a leg vein may be taken and used to go around (bypass) the blocked heart arteries. Sometimes parts of an artery from inside your chest wall or from your arm are used.  When the bypass is done, you are taken off the machine.  Your heart is started again. It will start pumping as it once did.  The sac around your heart is closed.  Your chest is closed with stitches or staples.  You will have tubes in your chest. These tubes are connected to a suction device. This device will drain fluid and puff up your lungs again. Minimally invasive surgery This surgery is done from a cut that is made on the left side of your chest. If needed, your surgeon may not have to slow or stop your heart. AFTER THE PROCEDURE  You will be taken to a recovery area to be watched.  You may wake up with a tube in your throat that helps you breathe. You may be connected to a breathing machine. You will not be able to talk when the tube is in. The tube is taken out when it is safe.  You may be groggy and have some pain. You will be given medicine to help the pain. Document Released: 07/03/2013 Document Reviewed: 07/03/2013 [facility] Patient Information 2015 [place], [place]. This information is not intended to replace advice given to you by your health care provider. Make sure you discuss any questions you have with your health care provider. Coronary Artery Bypass Grafting, Care After These instructions give you information on caring for yourself after your procedure. Your doctor may also give you more specific instructions. Call your doctor if you have any problems or questions after your procedure.  HOME CARE  Only take medicine as told by your doctor. Take medicines exactly as told. Do not stop taking medicines or start any new medicines without talking to your doctor  first.  Take your pulse as told by your doctor.  Do deep breathing as told by your doctor. Use your breathing device (incentive spirometer), if given, to practice deep breathing several times a day. Support your chest with a pillow or your arms when you take deep breaths or cough.  Keep the area clean, dry, and protected where the surgery cuts (incisions) were made. Remove bandages (dressings) only as told by your doctor. If strips were applied to surgical area, do not take them off. They fall off on their own.  Check the surgery area daily for puffiness (swelling), redness, or leaking fluid.  If surgery cuts were made in your legs:  Avoid crossing your legs.  Avoid sitting for long periods of time. Change positions every 30 minutes.  Raise your legs when you are sitting. Place them on pillows.  Wear stockings that help keep blood clots from forming in your legs (compression stockings).  Only take sponge baths until your doctor says it is okay to take showers. Pat the surgery area dry. Do not rub the surgery  area with a washcloth or towel. Do not bathe, swim, or use a hot tub until your doctor says it is okay.  Eat foods that are high in fiber. These include raw fruits and vegetables, whole grains, beans, and nuts. Choose lean meats. Avoid canned, processed, and fried foods.  Drink enough fluids to keep your pee (urine) clear or pale yellow.  Weigh yourself every day.  Rest and limit activity as told by your doctor. You may be told to:  Stop any activity if you have chest pain, shortness of breath, changes in heartbeat, or dizziness. Get help right away if this happens.  Move around often for short amounts of time or take short walks as told by your doctor. Gradually become more active. You may need help to strengthen your muscles and build endurance.  Avoid lifting, pushing, or pulling anything heavier than 10 pounds (4.5 kg) for at least 6 weeks after surgery.  Do not drive  until your doctor says it is okay.  Ask your doctor when you can go back to work.  Ask your doctor when you can begin sexual activity again.  Follow up with your doctor as told. GET HELP IF:  You have puffiness, redness, more pain, or fluid draining from the incision site.  You have a fever.  You have puffiness in your ankles or legs.  You have pain in your legs.  You gain 2 or more pounds (0.9 kg) a day.  You feel sick to your stomach (nauseous) or throw up (vomit).  You have watery poop (diarrhea). GET HELP RIGHT AWAY IF:  You have chest pain that goes to your jaw or arms.  You have shortness of breath.  You have a fast or irregular heartbeat.  You notice a "clicking" in your breastbone when you move.  You have numbness or weakness in your arms or legs.  You feel dizzy or light-headed. MAKE SURE YOU:  Understand these instructions.  Will watch your condition.  Will get help right away if you are not doing well or get worse. Document Released: 07/03/2013 Document Reviewed: 07/03/2013 [facility] Patient Information 2015 [place], [place]. This information is not intended to replace advice given to you by your health care provider. Make sure you discuss any questions you have with your health care provider.

## 2014-02-12 NOTE — Progress Notes (Signed)
PULMONARY / CRITICAL CARE MEDICINE   Name: Carl Lee MRN: [id number] DOB: 10/27/1955    ADMISSION DATE:  02/07/2014 CONSULTATION DATE:  8/1  REFERRING MD :  CVTS  CHIEF COMPLAINT:  Wheezes  STUDIES:    SIGNIFICANT EVENTS: 7/30 - CABG x3    BRIEF Hx:   58 y/o smoker (age 58 - 4) with 3 vessel CAD who underwent CABG x3 per CVTS on 7/30.  Patient developed post op wheezing and concern for PNA.  PCCM consulted for evaluation.    PMH - hyperlipidemia, hypercholesterolemia,He has a hx of bladder ca with Reoccurrence., pre-op myoview positive, cath showed LAD total occlusion, RCA 80% stenosis.  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     SUBJECTIVE:  Pt denies shortness of breath. Reports mild L chest pain with ambulation.  Minimal sputum production.   VITAL SIGNS: Temp:  [97.3 F (36.3 C)-97.7 F (36.5 C)] 97.3 F (36.3 C) (08/04 0510) Pulse Rate:  [65-78] 65 (08/04 0510) Resp:  [14-18] 18 (08/04 0510) BP: (110-114)/(75-80) 111/80 mmHg (08/04 0510) SpO2:  [94 %-97 %] 94 % (08/04 0510) Weight:  [191 lb 9.3 oz (86.9 kg)] 191 lb 9.3 oz (86.9 kg) (08/04 0145)  INTAKE / OUTPUT:  Intake/Output Summary (Last 24 hours) at 02/12/14 1040 Last data filed at 02/12/14 0853  Gross per 24 hour  Intake    650 ml  Output   2100 ml  Net  -1450 ml    PHYSICAL EXAMINATION: General: wdwn male in NAD, standing at sink HEENT: NCAT, PERRL PULM: resp's even/non-labored, lungs bilaterally clear, no wheezing CV: RRR, no mgr Ab: BS+, soft, nontender Ext: no edema, warm  LABS:  CBC  Recent Labs Lab 02/10/14 0310 02/11/14 0435 02/12/14 0500  WBC 23.2* 17.1* 15.5*  HGB 10.9* 10.8* 12.3*  HCT 32.6* 32.0* 36.7*  PLT 239 264 330   Coag's  Recent Labs Lab 02/06/14 1502 02/07/14 1300  APTT 30 31  INR 0.90 1.23   BMET  Recent Labs Lab 02/10/14 0310 02/11/14 0435 02/12/14 0500  NA 138 142 141  K 4.0 3.9 4.7   CL 97 102 99  CO2 28 30 30   BUN 15 27* 23  CREATININE 0.52 0.59 0.61  GLUCOSE 169* 96 121*   Electrolytes  Recent Labs Lab 02/07/14 2000 02/08/14 0500  02/10/14 0310 02/11/14 0435 02/12/14 0500  CALCIUM  --  7.8*  < > 8.4 8.2* 8.6  MG 2.8* 2.4  --   --   --   --   < > = values in this interval not displayed.  ABG  Recent Labs Lab 02/07/14 1656 02/08/14 0504 02/08/14 0914  PHART 7.299* 7.385 7.399  PCO2ART 47.6* 41.6 41.0  PO2ART 57.0* 66.0* 58.0*   Liver Enzymes  Recent Labs Lab 02/06/14 1502  AST 15  ALT 8  ALKPHOS 82  BILITOT 0.5  ALBUMIN 4.1   Glucose  Recent Labs Lab 02/10/14 2129 02/11/14 0749 02/11/14 1211 02/11/14 1633 02/11/14 2101 02/12/14 0613  GLUCAP 137* 82 94 124* 137* 108*    Imaging Dg Chest Port 1 View  02/11/2014   CLINICAL DATA:  Post CABG.  EXAM: PORTABLE CHEST - 1 VIEW  COMPARISON:  02/10/2014.  FINDINGS: Right central line tip proximal superior vena cava level.  Structure overlying the right lateral aspect of the trachea appears to be outside lead rather than the nasogastric tube or endotracheal tube.  Post CABG.  Heart size top-normal.  Bilateral pleural effusions/basal atelectasis. Limited for excluding basilar infiltrate or mass.  Mild central pulmonary vascular prominence.  No gross pneumothorax.  Left acromioclavicular joint degenerative changes.  IMPRESSION: Post CABG. Heart size top-normal.  Bilateral pleural effusions/basal atelectasis. Limited for excluding basilar infiltrate or mass.   Electronically Signed   By: Chauncey Cruel M.D.   On: 02/11/2014 07:04    ASSESSMENT / PLAN:  PULMONARY OETT 7/30>>7/31 A: Pot CABG with normal extubation COPD as confirmed by PFT's with moderate obstruction. Post cabg atx BL effusions Presumed PNA P:   Transition to dulera 2 puff bid 100/5 at home Xopenex inhaler at discharge 2 puff q4-6 hr prn dyspnea Prednisone, taper to off over one week Lasix 40 mg QD Pulmonary toilet Wean O2  to off for sats > 92% Assess ambulatory desaturations prior to discharge Mobilize Flutter valve ABX as below   CARDIOVASCULAR CVL 7/30 R i j cordis>>out A: CAD post CABG x 3 7/30 P:  Per cvts Ct lasix - good response   INFECTIOUS A:   Presumed pna > improving P:   Oral Levaquin, total 7 days abx   ENDOCRINE A:   Steroid induced hyperglycemia   P:   Decrease steroids to 20 mg on 8/5 SSI for glucose >160   TODAY'S SUMMARY: 58 y/o former smoker (age 58 -50) with 3 vessel CAD who underwent CABG x3 per CVTS on 60/30. Treat COPD flare, but suspect effusions playing a role & diuresis will help remains to be seen whether he will need short term oxygen on discharge, although expect him to do well long term.  Mobilize.    Noe Gens, NP-C [facility] Pgr: [phone] or [phone]    I have personally obtained a history, examined the patient, evaluated laboratory and imaging results, formulated the assessment and plan and placed orders.    Attending:  I have seen and examined the patient with nurse practitioner/resident and agree with the note above.   Off O2, doing well  Home regimen: Dulera 2 puff bid, xopenex prn, taper off prednisone as above Will arrange f/u with [place] in [facility] to sign off, call if needed  Roselie Awkward, MD [facility] Pager: [phone] Cell: [phone] If no response, call [phone]

## 2014-02-12 NOTE — Telephone Encounter (Signed)
lmomtcb x1 for pt 

## 2014-02-12 NOTE — Telephone Encounter (Signed)
OK, she specifically requested to see one of the two of [place], so just follow up with her for a definitive date

## 2014-02-12 NOTE — Progress Notes (Signed)
CARDIAC REHAB PHASE I   PRE:  Rate/Rhythm: 83 SR  BP:  Supine:   Sitting: 128/78  Standing:    SaO2: 91-93 %RA  MODE:  Ambulation: 890 ft   POST:  Rate/Rhythm: 87 SR  BP:  Supine:   Sitting: 110/70  Standing:    SaO2: 90-93%RA 1028-1110 Pt walked 890 ft with rolling walker and little asst. Gait steady. No DOE. Talked during walk without SOB. Left off oxygen in room. Encouraged IS and flutter valve.   Graylon Good, RN BSN  02/12/2014 11:05 AM

## 2014-02-12 NOTE — Telephone Encounter (Signed)
Pt's spouse returned call.  States she was returning the call because she thought something was wrong with the pt.  Pt's spouse states she is not ready to schedule an appointment with Dr. [name] or Dr. Stevenson Clinch in [place] at this time.  Will cb to schedule this appt.  Carl Lee

## 2014-02-13 ENCOUNTER — Inpatient Hospital Stay (HOSPITAL_COMMUNITY): Payer: BC Managed Care – PPO

## 2014-02-13 LAB — BASIC METABOLIC PANEL
ANION GAP: 14 (ref 5–15)
BUN: 32 mg/dL — ABNORMAL HIGH (ref 6–23)
CALCIUM: 8.8 mg/dL (ref 8.4–10.5)
CO2: 28 mEq/L (ref 19–32)
Chloride: 96 mEq/L (ref 96–112)
Creatinine, Ser: 0.82 mg/dL (ref 0.50–1.35)
GFR calc Af Amer: >90 mL/min (ref >90)
Glucose, Bld: 156 mg/dL — ABNORMAL HIGH (ref 70–99)
Potassium: 3.9 mEq/L (ref 3.7–5.3)
SODIUM: 138 meq/L (ref 137–147)

## 2014-02-13 LAB — GLUCOSE, CAPILLARY
GLUCOSE-CAPILLARY: 91 mg/dL (ref 70–99)
Glucose-Capillary: 133 mg/dL — ABNORMAL HIGH (ref 70–99)

## 2014-02-13 LAB — MAGNESIUM: MAGNESIUM: 2.1 mg/dL (ref 1.5–2.5)

## 2014-02-13 MED ORDER — ATORVASTATIN CALCIUM 20 MG PO TABS: 20.0000 mg | ORAL_TABLET | Freq: Every day | ORAL | Status: DC

## 2014-02-13 MED ORDER — METOPROLOL TARTRATE 25 MG PO TABS: 25.0000 mg | ORAL_TABLET | Freq: Two times a day (BID) | ORAL | Status: DC

## 2014-02-13 MED ORDER — ASPIRIN 325 MG PO TBEC: 325.0000 mg | DELAYED_RELEASE_TABLET | Freq: Every day | ORAL | Status: DC

## 2014-02-13 MED ORDER — MOMETASONE FURO-FORMOTEROL FUM 100-5 MCG/ACT IN AERO: 2.0000 | INHALATION_SPRAY | Freq: Two times a day (BID) | RESPIRATORY_TRACT | Status: DC

## 2014-02-13 MED ORDER — OXYCODONE HCL 5 MG PO TABS: 5.0000 mg | ORAL_TABLET | ORAL | Status: DC | PRN

## 2014-02-13 MED ORDER — PREDNISONE 5 MG PO TABS: ORAL_TABLET | ORAL | Status: DC

## 2014-02-13 MED ORDER — LEVOFLOXACIN 750 MG PO TABS: 750.0000 mg | ORAL_TABLET | Freq: Every day | ORAL | Status: DC

## 2014-02-13 NOTE — Progress Notes (Signed)
[id number] Pt has walked independently so I did not walk. Education completed with pt who voiced understanding. Discussed tobacco cessation and pt stated he plans to quit cold [place] which he has done before. Gave smoking cessation handouts. Encouraged pt to watch carbs as HGBA1C slightly elevated at 5.8. Discussed CRP 2 and pt gave permission for referral to [place] program. Pt does not need walker for home use.  Encouraged IS and flutter valve.Graylon Good RN BSN 02/13/2014 10:46 AM

## 2014-02-13 NOTE — Progress Notes (Signed)
[address]       [place] [address]             [phone]      6 Days Post-Op Procedure(s) (LRB): CORONARY ARTERY BYPASS GRAFTING (CABG) (N/A) INTRAOPERATIVE TRANSESOPHAGEAL ECHOCARDIOGRAM (N/A) Subjective: conts to feel well  Objective: Vital signs in last 24 hours: Temp:  [97.6 F (36.4 C)-97.9 F (36.6 C)] 97.6 F (36.4 C) (08/05 0359) Pulse Rate:  [72-89] 72 (08/05 0359) Cardiac Rhythm:  [-] Normal sinus rhythm (08/04 1950) Resp:  [18] 18 (08/05 0359) BP: (94-106)/(63-72) 94/63 mmHg (08/05 0359) SpO2:  [93 %-96 %] 96 % (08/05 0359) Weight:  [188 lb 0.8 oz (85.3 kg)] 188 lb 0.8 oz (85.3 kg) (08/05 0359)  Hemodynamic parameters for last 24 hours:    Intake/Output from previous day: 08/04 0701 - 08/05 0700 In: 480 [P.O.:480] Out: 400 [Urine:400] Intake/Output this shift:    General appearance: alert, cooperative and no distress Heart: regular rate and rhythm Lungs: clear to auscultation bilaterally Abdomen: benign Extremities: no edema Wound: incis healing well  Lab Results:  Recent Labs  02/11/14 0435 02/12/14 0500  WBC 17.1* 15.5*  HGB 10.8* 12.3*  HCT 32.0* 36.7*  PLT 264 330   BMET:  Recent Labs  02/11/14 0435 02/12/14 0500  NA 142 141  K 3.9 4.7  CL 102 99  CO2 30 30  GLUCOSE 96 121*  BUN 27* 23  CREATININE 0.59 0.61  CALCIUM 8.2* 8.6    PT/INR: No results found for this basename: LABPROT, INR,  in the last 72 hours ABG    Component Value Date/Time   PHART 7.399 02/08/2014 0914   HCO3 25.3* 02/08/2014 0914   TCO2 27 02/08/2014 0914   ACIDBASEDEF 3.0* 02/07/2014 1656   O2SAT 89.0 02/08/2014 0914   CBG (last 3)   Recent Labs  02/12/14 1620 02/12/14 2230 02/13/14 0600  GLUCAP 122* 132* 91   Scheduled Meds: . aspirin EC  325 mg Oral Daily   Or  . aspirin  324 mg Per Tube Daily  . bisacodyl  10 mg Oral Daily   Or  . bisacodyl  10 mg Rectal Daily  . docusate sodium  200 mg Oral Daily  . enoxaparin  (LOVENOX) injection  40 mg Subcutaneous QHS  . furosemide  40 mg Oral Daily  . guaiFENesin  1,200 mg Oral BID  . hydrochlorothiazide  25 mg Oral Daily  . insulin aspart  0-15 Units Subcutaneous TID WC  . levofloxacin  750 mg Oral Daily  . lisinopril  20 mg Oral Daily  . metoprolol tartrate  25 mg Oral BID   Or  . metoprolol tartrate  25 mg Per Tube BID  . mometasone-formoterol  2 puff Inhalation BID  . pantoprazole  40 mg Oral Daily  . potassium chloride  20 mEq Oral BID  . predniSONE  20 mg Oral Q breakfast  . sodium chloride  3 mL Intravenous Q12H   Continuous Infusions:  PRN Meds:.sodium chloride, alum & mag hydroxide-simeth, levalbuterol, LORazepam, magnesium hydroxide, ondansetron (ZOFRAN) IV, oxyCODONE, sodium chloride, traMADol  Dg Chest 2 View  02/13/2014   CLINICAL DATA:  Short of breath  EXAM: CHEST  2 VIEW  COMPARISON:  Yesterday  FINDINGS: Small bilateral pleural effusions. Normal heart size. Upper lungs clear. Basilar atelectasis.  IMPRESSION: Stable small pleural effusions.   Electronically Signed   By: Maryclare Bean M.D.   On: 02/13/2014 07:20   Dg Chest  2 View  02/12/2014   CLINICAL DATA:  Shortness of breath.  EXAM: CHEST  2 VIEW  COMPARISON:  02/11/2014.02/07/2014.  FINDINGS: Interim removal of NG tube and right IJ sheath. Mediastinum and hilar structures are normal. Prior CABG. Pulmonary vascularity normal. Mild bibasilar atelectasis and small pleural effusions are present. No pneumothorax. No acute osseus abnormality.  IMPRESSION: 1. Interim removal of NG tube and right IJ sheath. 2. Mild bibasilar atelectasis and small pleural effusions. 3. Prior CABG.  Heart size and pulmonary vascularity normal.   Electronically Signed   By: Marcello Moores  Register   On: 02/12/2014 08:08    Assessment/Plan: S/P Procedure(s) (LRB): CORONARY ARTERY BYPASS GRAFTING (CABG) (N/A) INTRAOPERATIVE TRANSESOPHAGEAL ECHOCARDIOGRAM (N/A)  1 conts to progress nicely, off O2, no SOB 2 cont gentle diuresis  with small effus 3 d/c epw's.  4 had a couple short runs of vtach- recheck K+/MG++ 5 poss d/c later today or tomorrow    LOS: 6 days    Armany Mano E 02/13/2014

## 2014-02-13 NOTE — Progress Notes (Signed)
Patient had 6 beat run of v-tach. Patient asymptomatic. Will continue to monitor. Call bell within reach.  Domingo Dimes RN

## 2014-02-13 NOTE — Progress Notes (Signed)
02/12/2014 10:50 PM Progress note Patient ambulated in the hallway with walker and RN at about 600 feet.  The patient tolerated ambulating well with no shortness of breathe or pain.  Will continue to encourage to increase ambulation as tolerated. Lupita Dawn

## 2014-02-14 ENCOUNTER — Telehealth: Payer: Self-pay | Admitting: Pulmonary Disease

## 2014-02-14 LAB — GLUCOSE, CAPILLARY: Glucose-Capillary: 96 mg/dL (ref 70–99)

## 2014-02-14 MED ORDER — LEVALBUTEROL TARTRATE 45 MCG/ACT IN AERO: 1.0000 | INHALATION_SPRAY | Freq: Four times a day (QID) | RESPIRATORY_TRACT | Status: DC | PRN

## 2014-02-14 NOTE — Telephone Encounter (Signed)
LMTCB

## 2014-02-14 NOTE — Telephone Encounter (Signed)
Called and spoke to pt. Pt stated after being d/c'ed from the hospital pt was provided an rx for his [facility] but not a rescue inhaler. Per the progress note on 02/12/14 by BQ:   Home regimen: Dulera 2 puff bid, xopenex prn, taper off prednisone as above  Will arrange f/u with [place] in [place] pt of the xopenex hfa as his rescue inhaler and to only use this as needed. Pharmacy verified. Rx sent. Pt aware of his HFU.

## 2014-02-19 ENCOUNTER — Ambulatory Visit (INDEPENDENT_AMBULATORY_CARE_PROVIDER_SITE_OTHER): Payer: Self-pay | Admitting: Physician Assistant

## 2014-02-19 VITALS — BP 100/63 | HR 61

## 2014-02-19 DIAGNOSIS — L02419 Cutaneous abscess of limb, unspecified: Secondary | ICD-10-CM

## 2014-02-19 DIAGNOSIS — L03119 Cellulitis of unspecified part of limb: Principal | ICD-10-CM

## 2014-02-19 MED ORDER — SULFAMETHOXAZOLE-TMP DS 800-160 MG PO TABS: 1.0000 | ORAL_TABLET | Freq: Two times a day (BID) | ORAL | Status: DC

## 2014-02-19 MED ORDER — NYSTATIN 100000 UNIT/ML MT SUSP: 5.0000 mL | Freq: Four times a day (QID) | OROMUCOSAL | Status: DC

## 2014-02-19 NOTE — Progress Notes (Signed)
  HPI:  [name] is S/P CABG performed on 02/07/2014.  He presents to the office today with concerns regarding drainage from his Endovein harvest site.  The patient states that his EVH site has been draining a constant amount of clear serous fluid.  He states its odorless and he denies fevers.  The patient also admits to some dysphagia.  He states several days ago there was white plagues on his tongue and he took probiotics which helped resolve some of this.    Current Outpatient Prescriptions  Medication Sig Dispense Refill  . aspirin EC 325 MG EC tablet Take 1 tablet (325 mg total) by mouth daily.      [name] atorvastatin (LIPITOR) 20 MG tablet Take 1 tablet (20 mg total) by mouth daily at 6 PM.  30 tablet  1  . Glucosamine-Chondroit-Vit C-Mn (GLUCOSAMINE 1500 COMPLEX PO) Take 2 tablets by mouth daily.      [name] levalbuterol (XOPENEX HFA) 45 MCG/ACT inhaler Inhale 1-2 puffs into the lungs every 6 (six) hours as needed for wheezing.  1 Inhaler  12  . levofloxacin (LEVAQUIN) 750 MG tablet Take 1 tablet (750 mg total) by mouth daily.  8 tablet  0  . lisinopril-hydrochlorothiazide (PRINZIDE,ZESTORETIC) 20-25 MG per tablet Take 1 tablet by mouth daily.  90 tablet  3  . LORazepam (ATIVAN) 0.5 MG tablet Take 1 tablet (0.5 mg total) by mouth 2 (two) times daily as needed for anxiety.  20 tablet  0  . metoprolol tartrate (LOPRESSOR) 25 MG tablet Take 1 tablet (25 mg total) by mouth 2 (two) times daily.  60 tablet  1  . mometasone-formoterol (DULERA) 100-5 MCG/ACT AERO Inhale 2 puffs into the lungs 2 (two) times daily.  13 g  1  . Multiple Vitamins-Minerals (CENTRUM SILVER ADULT 50+ PO) Take 1 capsule by mouth daily.      [name] oxyCODONE (OXY IR/ROXICODONE) 5 MG immediate release tablet Take 1-2 tablets (5-10 mg total) by mouth every 4 (four) hours as needed for moderate pain.  50 tablet  0  . predniSONE (DELTASONE) 5 MG tablet Take 20 mg for 3 days;  followed by 10 mg for 3 days; followed by 5 mg for 3 days; then stop   21 tablet  0   No current facility-administered medications for this visit.    Physical Exam:  BP 100/63  Pulse 61  SpO2 96%  Gen: no apparent distress ENT: no evidence of white plagues in mouth currently, discomfort is with swallowing Heart: RRR Lungs: CTA bilaterally Skin: EVH site distal end of incision is erythematous, small area of induration present, clear fluid present.  No evidence of seroma present  A/P:  1. Possible Early Cellulitis in his right EVH site, patient was treated with Levaquin in the hospital for a lung infection, will give a 7 day course of Bactrim 2. ? Thrush- will give Nystatin swish and swallow 2. RTC in 2 weeks for wound check, patient is to contact our office sooner should the wound progress

## 2014-02-24 ENCOUNTER — Telehealth: Payer: Self-pay | Admitting: Thoracic Surgery (Cardiothoracic Vascular Surgery)

## 2014-02-24 NOTE — Telephone Encounter (Signed)
Mr. Jay called to report low BP. 70-80,40-50, most recent 75/45, HR 67. He gets dizzy when he first stands up.   I advised him to stop lisinopril/HCTZ.   He will also hold metoprolol tonight but will resume it tomorrow AM  I advised him to call our office tomorrow AM and come by to have BP checked and make sure there are no other issues.  If his dizziness gets worse or any other symptoms become apparent he should go to the ED

## 2014-02-25 ENCOUNTER — Ambulatory Visit (INDEPENDENT_AMBULATORY_CARE_PROVIDER_SITE_OTHER): Payer: Self-pay | Admitting: Physician Assistant

## 2014-02-25 VITALS — BP 109/75 | HR 67 | Ht 71.0 in | Wt 188.0 lb

## 2014-02-25 DIAGNOSIS — I9589 Other hypotension: Secondary | ICD-10-CM

## 2014-02-25 DIAGNOSIS — I952 Hypotension due to drugs: Secondary | ICD-10-CM

## 2014-02-25 DIAGNOSIS — T50904A Poisoning by unspecified drugs, medicaments and biological substances, undetermined, initial encounter: Secondary | ICD-10-CM

## 2014-02-25 MED ORDER — OXYCODONE HCL 5 MG PO TABS: 5.0000 mg | ORAL_TABLET | ORAL | Status: DC | PRN

## 2014-02-25 NOTE — Progress Notes (Signed)
HPI:  Carl Lee presents today for blood pressure check.  He was last evaluated in our office last week on 02/19/2014.  During that visit his primary concern was serous drainage from his [facility] site.  H was given empiric ABX to treat possible early cellulitis and instructed to follow up as previously scheduled.  However, over the weekend the patient became hypotensive with SBP ranging in the 70s and contacted our answering service.  They directed him to Dr. [name] who recommended the patient stop taking his Lisinopril/HCTZ combination.  He was also instructed to hold Metoprolol and restart on Sunday.  Currently, the patient states he feels a little bit better.  He continues to feel fatigued.  He has kept track of his blood pressure which have been running in the upper 90s to low 100s.  His heart rate has been in the 60s.  His RLE EVH site is no longer draining.  The area of induration has resolved and the tunnel is no longer hard.  He continues to have some dysphagia which has also improved.  I encouraged the patient to give this a little bit more time and if at his next visit this is still bothersome we can refer him to ENT or GI for evaluation.  Finally he complains of some left sided chest discomfort.  He states he was awoken from sleep and found to be on his left side.  I explained to the patient the importance of trying to sleep on his back and to observed sternal precautions for a minimum of 6 weeks from date of surgery.   Current Outpatient Prescriptions  Medication Sig Dispense Refill  . aspirin EC 325 MG EC tablet Take 1 tablet (325 mg total) by mouth daily.      [name] atorvastatin (LIPITOR) 20 MG tablet Take 1 tablet (20 mg total) by mouth daily at 6 PM.  30 tablet  1  . Glucosamine-Chondroit-Vit C-Mn (GLUCOSAMINE 1500 COMPLEX PO) Take 2 tablets by mouth daily.      [name] levalbuterol (XOPENEX HFA) 45 MCG/ACT inhaler Inhale 1-2 puffs into the lungs every 6 (six) hours as needed for wheezing.  1  Inhaler  12  . LORazepam (ATIVAN) 0.5 MG tablet Take 1 tablet (0.5 mg total) by mouth 2 (two) times daily as needed for anxiety.  20 tablet  0  . metoprolol tartrate (LOPRESSOR) 25 MG tablet Take 1 tablet (25 mg total) by mouth 2 (two) times daily.  60 tablet  1  . mometasone-formoterol (DULERA) 100-5 MCG/ACT AERO Inhale 2 puffs into the lungs 2 (two) times daily.  13 g  1  . Multiple Vitamins-Minerals (CENTRUM SILVER ADULT 50+ PO) Take 1 capsule by mouth daily.      [name] nystatin (MYCOSTATIN) 100000 UNIT/ML suspension Take 5 mLs (500,000 Units total) by mouth 4 (four) times daily.  60 mL  0  . oxyCODONE (OXY IR/ROXICODONE) 5 MG immediate release tablet Take 1-2 tablets (5-10 mg total) by mouth every 4 (four) hours as needed for moderate pain.  50 tablet  0  . predniSONE (DELTASONE) 5 MG tablet Take 20 mg for 3 days;  followed by 10 mg for 3 days; followed by 5 mg for 3 days; then stop  21 tablet  0  . sulfamethoxazole-trimethoprim (BACTRIM DS) 800-160 MG per tablet Take 1 tablet by mouth 2 (two) times daily. For 7 Days  14 tablet  0   No current facility-administered medications for this visit.    Physical Exam:  BP  109/75  Pulse 67  Ht 5\' 11"  (1.803 m)  Wt 188 lb (85.276 kg)  BMI 26.23 kg/m2  SpO2 95%  Gen: no apparent distress Heart: RRR Lungs:  CTA bilaterally Skin: RLE improved, no further drainage present, induration resolved  A/P:  1. Hypotension- improved with stopping of Lisinopril/HCTZ combination, however patients pressure is still borderline and associated with Bradycardia.  Therefore we will decrease his Lopressor to 12.5 mg BID.  He is scheduled to see Cardiology next week 2. RLE Cellulitis- resolved, will complete course of Bactrim 3. Left sided chest discomfort- reinforced sternal precautions, instructed patient to try short course of Ibuprofen 4. RTC as scheduled 9/8, however should patient continue to feel poorly and pressure and HR don't improve he was instructed to  contact our office sooner

## 2014-03-02 ENCOUNTER — Telehealth: Payer: Self-pay | Admitting: Physician Assistant

## 2014-03-02 NOTE — Telephone Encounter (Signed)
    Carl Lee recently had CABG 01/2014 and is followed by Dr. Sammuel Hines. Was having dizziness and hypotension and his lisinopril/HCTZ and metoprolol were held. He was then placed back on metoprolol at 1/2 his dose and eventually resumed on his normal dose of 25mg  BID. He has been continuing to hold lisinopril/HCTZ. He now calls with elevated BPs. Last reading 144/90. I advised him to resume his lisinopril with close watch on his BP. He has a follow up appt with Richardson Dopp PA-C in office next week. He knows to call back or come in if he has further issues with hypotension or dizziness. Also complains of further weeping from vein harvest site. Was previously on Abx. I have advised him to call [facility] regarding this issue.   Perry Mount PA-C  [facility]

## 2014-03-04 ENCOUNTER — Other Ambulatory Visit: Payer: Self-pay | Admitting: Family Medicine

## 2014-03-04 ENCOUNTER — Other Ambulatory Visit: Payer: Self-pay

## 2014-03-04 ENCOUNTER — Other Ambulatory Visit: Payer: Self-pay | Admitting: Physician Assistant

## 2014-03-04 MED ORDER — LISINOPRIL-HYDROCHLOROTHIAZIDE 20-25 MG PO TABS: 1.0000 | ORAL_TABLET | Freq: Every day | ORAL | Status: DC

## 2014-03-04 NOTE — Telephone Encounter (Signed)
See phone note from 03/02/2014.  Pt was advised to slowly restart both lopressor and then combo med due to elevated bp readings. Now back on previous doses of lisinopril hctz 20/25 once daily and lopressor 25mg  bid.

## 2014-03-04 NOTE — Telephone Encounter (Signed)
plz notify sent in. Is he also taking lopressor?

## 2014-03-04 NOTE — Telephone Encounter (Signed)
Reviewed chart. He saw [facility] last week and notes indicate he was hypotensive and Lisinopril/HCTZ was to be DC'd. Notes also indicate that Lopressor was decreased to 12.5 bid. Arbie Cookey, I see him this week.  Can we call him to see what he is taking, exactly? Thanks Richardson Dopp, PA-C   03/04/2014 5:19 PM

## 2014-03-04 NOTE — Telephone Encounter (Signed)
Pt had open heart surgery 3 weeks ago; states cardiologist started pt on lopressor; pt still taking lisinopril HCTZ but pt took last lisinopril HCTZ on 03/03/14 and his BP today is 150/85; pt does not want to contact cardiologist and request refill from Dr Darnell Level. Lisinopril HCTZ was on historical med list with notation discontinued by provider but pt states he has been taking daily. Pt request cb. [facility].

## 2014-03-04 NOTE — Telephone Encounter (Signed)
Noted. Lisinopril hctz 20/25mg  daily refilled. Pt also taking lopressor 25mg  bid. He has f/u with Richardson Dopp on Wed. Will route to him as fyi.

## 2014-03-04 NOTE — Telephone Encounter (Signed)
Patient notified. He is taking lopressor.

## 2014-03-05 NOTE — Telephone Encounter (Signed)
Got it.  Thanks! Richardson Dopp, PA-C   03/05/2014 7:59 AM

## 2014-03-06 ENCOUNTER — Ambulatory Visit (INDEPENDENT_AMBULATORY_CARE_PROVIDER_SITE_OTHER): Payer: BC Managed Care – PPO | Admitting: Physician Assistant

## 2014-03-06 ENCOUNTER — Encounter: Payer: Self-pay | Admitting: Physician Assistant

## 2014-03-06 VITALS — BP 118/68 | HR 52 | Ht 71.0 in | Wt 196.0 lb

## 2014-03-06 DIAGNOSIS — I2510 Atherosclerotic heart disease of native coronary artery without angina pectoris: Secondary | ICD-10-CM

## 2014-03-06 DIAGNOSIS — I714 Abdominal aortic aneurysm, without rupture, unspecified: Secondary | ICD-10-CM

## 2014-03-06 DIAGNOSIS — I10 Essential (primary) hypertension: Secondary | ICD-10-CM

## 2014-03-06 DIAGNOSIS — C679 Malignant neoplasm of bladder, unspecified: Secondary | ICD-10-CM

## 2014-03-06 DIAGNOSIS — E785 Hyperlipidemia, unspecified: Secondary | ICD-10-CM

## 2014-03-06 DIAGNOSIS — I6509 Occlusion and stenosis of unspecified vertebral artery: Secondary | ICD-10-CM

## 2014-03-06 DIAGNOSIS — Z951 Presence of aortocoronary bypass graft: Secondary | ICD-10-CM

## 2014-03-06 DIAGNOSIS — F17200 Nicotine dependence, unspecified, uncomplicated: Secondary | ICD-10-CM

## 2014-03-06 LAB — BASIC METABOLIC PANEL WITH GFR
BUN: 20 mg/dL (ref 6–23)
CO2: 27 meq/L (ref 19–32)
Calcium: 9.3 mg/dL (ref 8.4–10.5)
Chloride: 102 meq/L (ref 96–112)
Creatinine, Ser: 0.7 mg/dL (ref 0.4–1.5)
GFR: 120.3 mL/min
Glucose, Bld: 56 mg/dL — ABNORMAL LOW (ref 70–99)
Potassium: 4 meq/L (ref 3.5–5.1)
Sodium: 139 meq/L (ref 135–145)

## 2014-03-06 MED ORDER — METOPROLOL SUCCINATE ER 25 MG PO TB24: 25.0000 mg | ORAL_TABLET | Freq: Every day | ORAL | Status: DC

## 2014-03-06 NOTE — Patient Instructions (Addendum)
LAB WORK TODAY; BMET  DECREASE TOPROL TO 25 MG DAILY  PER SCOTT WEAVER, PAC FOR YOU TO FOLLOW UP WITH YOUR EYE DOCTOR   Your physician recommends that you schedule a follow-up appointment in: 04/17/14 11:30 WITH DR. [name]  FASTING LIPID AND LIVER PANEL 04/15/14

## 2014-03-06 NOTE — Progress Notes (Signed)
Cardiology Office Note    Date:  03/06/2014   ID:  Carl Lee, DOB [birth year], MRN [id number]  PCP:  [name], MD  Cardiologist:  Dr. [name]      History of Present Illness: Carl Lee is a 58 y.o. male with a hx of recurrent bladder CA, mild carotid stenosis followed by [facility], AAA s/p endovascular repair (9/13 Dr. [name]), tobacco abuse, HL, HTN.  He was seen for pre-op clearance by Dr. [name] and set up for a stress myoview.  This was high risk with large scar burden.  Cardiac cath was arranged and demonstrated 3 v CAD with total occlusion of the LAD and RCA with low normal LVF.  He was referred for CABG.  He was admitted 7/30-8/4 for CABG with Dr. [name] (L-LAD, RIMA-OM, S-PDA).  Post op course was c/b increased pulmonary secretions requiring reintubation.  He remained in NSR.  He was seen by [facility] 8/11 with drainage from SVG harvest site treated with antibiotics.  His BP was running low and his Lisinopril/HCTZ was DC'd and his beta blocker was reduced.  He called in with elevated BPs after this and his Lisinopril/HCTZ was resumed.    He returns for FU.  He is doing well.  BPs at home are better now.  He continues to have significant chest soreness.  This is slowly improving.  He feels fatigued.  He denies significant dyspnea.  He is walking 30 minutes a day.  He denies orthopnea, PND, edema.  He denies syncope.  He has noted worsening visual acuity since his surgery.  This is bilateral.  He denies visual field loss, amaurosis fugax, unilateral weakness, difficulty with speech.  His appetite is poor.  Still notes a sore throat and hoarseness from intubation.     Studies:  - LHC (7/15):  pLAD 30 then 100 (L-L collats), pCFX 80, mCFX 50, pRCA 100 (R-R and L-R collats), EF 50%, ant HK and poss LV apical aneurysm  - Nuclear (7/15):  Inferior and anteroseptal, mid and dist anterior and dist inferior and apical fixed defect, no ischemia, EF 49%; High Risk  - Carotid US  (7/15):  Bilateral ICA 1-39%   Recent Labs/Images: 02/06/2014: ALT 8  02/12/2014: Hemoglobin 12.3*  02/13/2014: Creatinine 0.82; Potassium 3.9   Dg Chest 2 View  02/06/2014   CLINICAL DATA:  Preop cardiac surgery  EXAM: CHEST  2 VIEW  COMPARISON:  01/15/2014  FINDINGS: There is mild elevation of the left diaphragm. There is no focal parenchymal opacity, pleural effusion, or pneumothorax. The heart and mediastinal contours are unremarkable. The osseous structures are unremarkable.  IMPRESSION: No active cardiopulmonary disease.   Electronically Signed   By: Kathreen Devoid   On: 02/06/2014 16:18   Dg Chest Port 1 View  02/07/2014   CLINICAL DATA:  Re intubated.  Check tube placement.  EXAM: PORTABLE CHEST - 1 VIEW  COMPARISON:  02/07/2014  FINDINGS: Endotracheal tube is in place with tip approximately 3.9 cm above carina. Swan-Ganz catheter tip overlies the level of the right lower lobe pulmonary artery. Bilateral chest tubes are in place. Mediastinal drains are present. There is bilateral lower lobe opacity left greater than right. Bilateral small effusions are noted. No pneumothorax.  IMPRESSION: Endotracheal tube approximately 3.9 cm above carina.   Electronically Signed   By: Shon Hale M.D.   On: 02/07/2014 19:41   Dg Chest Portable 1 View  02/07/2014   CLINICAL DATA:  Postop.  EXAM: PORTABLE  CHEST - 1 VIEW  COMPARISON:  02/06/2014.  FINDINGS: Endotracheal tube tip 5.5 cm above the carina.  Right-sided Swan-Ganz catheter tip pulmonary outflow tract level.  Mediastinal drains and bilateral chest tubes are in place. No gross pneumothorax.  Hazy appearance left base suggestive of pleural fluid and/or atelectasis.  Pulmonary vascular congestion.  Nasogastric tube courses below the diaphragm. Tip is not included on the present exam.  IMPRESSION: Endotracheal tube tip 5.5 cm above the carina.  Right-sided Swan-Ganz catheter tip pulmonary outflow tract level.  Mediastinal drains and bilateral chest tubes are  in place. No gross pneumothorax.  Hazy appearance left base suggestive of pleural fluid and/or atelectasis.  Pulmonary vascular congestion.   Electronically Signed   By: Chauncey Cruel M.D.   On: 02/07/2014 14:08     Wt Readings from Last 3 Encounters:  03/06/14 196 lb (88.905 kg)  02/25/14 188 lb (85.276 kg)  02/13/14 188 lb 0.8 oz (85.3 kg)     Past Medical History  Diagnosis Date  . Bladder cancer 08/16/2008    [facility] then [place] now [place])  . Hypertension   . AAA (abdominal aortic aneurysm) 03/25/12    s/p endovascular repair  . Hx of migraines   . Contact dermatitis     atypical [name])  . Childhood asthma   . Environmental allergies     improved as ages  . Diverticulosis 2013    severe by CT and colonoscopy  . Adenomatous polyp 06/2009  . Lumbosacral radiculopathy at S1 03/2012    left, with spinal stenosis (MRI 04/2013) improved with TF ESI L5/S1 and S1/2 ([facility])  . Complication of anesthesia     last anesth. pt. reports that he had lack of energy for a yr. plus  . Coronary artery disease   . Headache(784.0)     on & off- none since AAA repair   . Arthritis     spinal stenosis, "bulging disc"- S1, hsa been followed by [facility]     Current Outpatient Prescriptions  Medication Sig Dispense Refill  . aspirin EC 325 MG EC tablet Take 1 tablet (325 mg total) by mouth daily.      [name] atorvastatin (LIPITOR) 20 MG tablet Take 1 tablet (20 mg total) by mouth daily at 6 PM.  30 tablet  1  . Glucosamine-Chondroit-Vit C-Mn (GLUCOSAMINE 1500 COMPLEX PO) Take 2 tablets by mouth daily.      [name] levalbuterol (XOPENEX HFA) 45 MCG/ACT inhaler Inhale 1-2 puffs into the lungs every 6 (six) hours as needed for wheezing.  1 Inhaler  12  . lisinopril-hydrochlorothiazide (PRINZIDE,ZESTORETIC) 20-25 MG per tablet TAKE 1 TABLET EVERY MORNING  90 tablet  0  . LORazepam (ATIVAN) 0.5 MG tablet Take 1 tablet (0.5 mg total) by mouth 2 (two) times daily as needed for anxiety.  20 tablet  0  .  metoprolol succinate (TOPROL-XL) 25 MG 24 hr tablet Take 25 mg by mouth 2 (two) times daily.       . mometasone-formoterol (DULERA) 100-5 MCG/ACT AERO Inhale 2 puffs into the lungs 2 (two) times daily.  13 g  1  . Multiple Vitamins-Minerals (CENTRUM SILVER ADULT 50+ PO) Take 1 capsule by mouth daily.      [name] oxyCODONE (OXY IR/ROXICODONE) 5 MG immediate release tablet Take 1-2 tablets (5-10 mg total) by mouth every 4 (four) hours as needed for moderate pain.  30 tablet  0   No current facility-administered medications for this visit.     Allergies:  Codeine   Social History:  The patient  reports that he quit smoking about 4 weeks ago. His smoking use included Cigarettes. He smoked 0.00 packs per day for 30 years. He has never used smokeless tobacco. He reports that he drinks alcohol. He reports that he does not use illicit drugs.   Family History:  The patient's family history includes Arrhythmia in his mother; CAD (age of onset: 70) in his father; COPD in his mother; Cancer in his mother; Dementia in his father; Diabetes in his brother, father, mother, and sister; Heart attack in his father and maternal grandmother; Hyperlipidemia in his mother; Hypertension in his brother and mother; Thyroid disease in his mother. There is no history of Colon cancer.   ROS:  Please see the history of present illness.      All other systems reviewed and negative.   PHYSICAL EXAM: VS:  BP 118/68  Pulse 52  Ht 5\' 11"  (1.803 m)  Wt 196 lb (88.905 kg)  BMI 27.35 kg/m2 Well nourished, well developed, in no acute distress HEENT: normal Neck: no JVD Cardiac:  normal S1, S2; RRR; no murmur Chest: median sternotomy well healed without erythema or discharge Lungs:  clear to auscultation bilaterally, no wheezing, rhonchi or rales Abd: soft, nontender, no hepatomegaly Ext: no edemaEVH site on R leg well healed without erythema or drainage Skin: warm and dry Neuro:  CNs 2-12 intact, no focal abnormalities  noted  EKG:  Sinus brady, HR 52, normal axis, septal Q waves, NSSTTW changes     ASSESSMENT AND PLAN:  Atherosclerosis of native coronary artery of native heart without angina pectoris S/P CABG x 3:  Making good progress after recent CABG.  He is still sore and feels fatigued.  He is also struggling with poor appetite.  His HR is slow and he may be fatigued from this.  I will decrease his Toprol-XL to 25 mg QD.  I have encouraged him to drink Ensure or Boost with his meals to add calories.  His activity level is good.  I have encouraged him to FU with cardiac rehab to start the program.  I have reassured him that his chest soreness will gradually improve.  He will see Dr. [name] 9/8.  Malignant neoplasm of urinary bladder, unspecified site:  He is scheduled for a transurethral approach to his bladder tumor late next month.   Essential hypertension:  Controlled.  Check BMET today.  Dyslipidemia:  Continue statin.  Will need to arrange FU lipids and LFTs before next OV.  Smoker:  He has quit smoking.  Occlusion and stenosis of vertebral artery without mention of cerebral infarction:  He is followed by [facility].  Abdominal aneurysm without mention of rupture:  He is followed by [facility].  Decreased Visual Acuity:  I have encouraged him to FU with his optometrist.  Symptoms are unilateral and not suggestive of an acute event.  Disposition:  FU with Dr. [name] in 6 weeks.   Signed, Versie Starks, MHS 03/06/2014 12:13 PM    [facility] [place], [place], [place]  [address] Phone: [phone]; Fax: [phone]

## 2014-03-07 ENCOUNTER — Telehealth: Payer: Self-pay

## 2014-03-07 ENCOUNTER — Telehealth: Payer: Self-pay | Admitting: *Deleted

## 2014-03-07 NOTE — Telephone Encounter (Signed)
Noted. If persistent dizziness/lightheadedness or any hypoglycemic sxs like shaking or sweating or passing out rec f/u with me.  If starts feeling better with time out from surgery ok to monitor for now.

## 2014-03-07 NOTE — Telephone Encounter (Signed)
pt's wife notified about pt's lab results. Wife aware glucose low at 56. States pt has fm hx of DM. Per Brynda Rim.  try drinking boost/ensure as discussed yesterday @ OV.  Wife vebalized understanding to Plan of Care.

## 2014-03-07 NOTE — Telephone Encounter (Signed)
Pt left v/m; pt was advised by cardiology office that lab results from 03/06/14 glucose was 56; pt request Dr Darnell Level to review his chart from cardiology appt on 03/06/14. pt wants to know if he should f/u with Dr Darnell Level or what to do? Pt request cb.

## 2014-03-07 NOTE — Telephone Encounter (Signed)
Patient notified. He said he does feel bad with some lightheadness, but can't really tell if it's coming from his surgery or if it's due to his sugar. I advised him to eat regular meals and he said he doesn't have much appetite. So I told him to the best he could. I went ahead and scheduled an appt with you for next week and advised to call or go to [facility] if anything changed in the meantime. He verbalized understanding.

## 2014-03-12 ENCOUNTER — Encounter: Payer: Self-pay | Admitting: Family Medicine

## 2014-03-12 ENCOUNTER — Ambulatory Visit: Payer: BC Managed Care – PPO | Admitting: Thoracic Surgery (Cardiothoracic Vascular Surgery)

## 2014-03-12 ENCOUNTER — Ambulatory Visit (INDEPENDENT_AMBULATORY_CARE_PROVIDER_SITE_OTHER): Payer: BC Managed Care – PPO | Admitting: Family Medicine

## 2014-03-12 VITALS — BP 104/62 | HR 68 | Temp 98.3°F | Wt 193.5 lb

## 2014-03-12 DIAGNOSIS — I10 Essential (primary) hypertension: Secondary | ICD-10-CM

## 2014-03-12 DIAGNOSIS — R42 Dizziness and giddiness: Secondary | ICD-10-CM

## 2014-03-12 DIAGNOSIS — I2510 Atherosclerotic heart disease of native coronary artery without angina pectoris: Secondary | ICD-10-CM

## 2014-03-12 DIAGNOSIS — Z87891 Personal history of nicotine dependence: Secondary | ICD-10-CM

## 2014-03-12 DIAGNOSIS — C679 Malignant neoplasm of bladder, unspecified: Secondary | ICD-10-CM

## 2014-03-12 MED ORDER — HYDROCHLOROTHIAZIDE 12.5 MG PO CAPS: 12.5000 mg | ORAL_CAPSULE | Freq: Every day | ORAL | Status: DC

## 2014-03-12 MED ORDER — LISINOPRIL 20 MG PO TABS: 20.0000 mg | ORAL_TABLET | Freq: Every day | ORAL | Status: DC

## 2014-03-12 NOTE — Patient Instructions (Addendum)
I think this lightheadedness is coming from low blood pressures. Continue toprol xl 25mg  daily, we will split lisinopril/hctz to individual components. Continue lisinopril 20mg  daily, and start lower hctz dose of 12.5mg  daily. May take all in am. Eat 3 meals a day to avoid low sugars. Good to see you today,call us with quesitons. Return in 3 months for physical.

## 2014-03-12 NOTE — Assessment & Plan Note (Signed)
Med changes as per below - new regimen will be hctz 12.5mg  daily, lisinopril 20mg  daily, and Toprol XL 25mg  daily. All in am.

## 2014-03-12 NOTE — Assessment & Plan Note (Signed)
Quit smoking 01/2014 prior to CABG.

## 2014-03-12 NOTE — Progress Notes (Signed)
Pre visit review using our clinic review tool, if applicable. No additional management support is needed unless otherwise documented below in the visit note. 

## 2014-03-12 NOTE — Assessment & Plan Note (Addendum)
Upcoming rpt ablation surgery late 03/2014.

## 2014-03-12 NOTE — Progress Notes (Signed)
BP 104/62  Pulse 68  Temp(Src) 98.3 F (36.8 C) (Oral)  Wt 193 lb 8 oz (87.771 kg)   CC: dizziness  Subjective:    Patient ID: Carl Lee, male    DOB: 15-Sep-1955, 58 y.o.   MRN: [id number]  HPI: Carl Lee is a 58 y.o. male presenting on 03/12/2014 for Hypoglycemia and Dizziness   Recent CABG. CORONARY ARTERY BYPASS GRAFT Laterality: N/A Date: 02/07/2014 Procedure: CORONARY ARTERY BYPASS GRAFTING (CABG); Surgeon: [name], MD; Location: [facility]; Service: Open Heart Surgery; Laterality: N/A; CABG X 3, BILATERAL LIMA, EVH  Since then, having increased dizzy spells. Initially with trouble regulating blood pressures so ACEI/HCTZ and toprol XL were held then slowly restarted. Most recently toprol was decreased to 25mg  daily. Has f/u appt with CVTS 03/19/2014.  Brings log of bp: 89-118/60-70, HR 58-70s.  Currently on lisinopril hctz 20/25 once daily and metoprolol succ 25mg  daily.   Recent BMP found cbg low at 56 (fasting sugar). Wonders about hypoglycemia - fmhx DM. Lab Results  Component Value Date   HGBA1C 5.8* 02/06/2014  Did need insulin while in hospital. Off this since discharged.   Persistent episodes of dizziness - described as tunnel vision with lightheadedness. Tends to happen every few days more when standing up.  No vertigo, presyncope.   BP Readings from Last 3 Encounters:  03/12/14 104/62  03/06/14 118/68  02/25/14 109/75   Wt Readings from Last 3 Encounters:  03/12/14 193 lb 8 oz (87.771 kg)  03/06/14 196 lb (88.905 kg)  02/25/14 188 lb (85.276 kg)  Body mass index is 27 kg/(m^2).   Relevant past medical, surgical, family and social history reviewed and updated as indicated.  Allergies and medications reviewed and updated. Current Outpatient Prescriptions on File Prior to Visit  Medication Sig  . aspirin EC 325 MG EC tablet Take 1 tablet (325 mg total) by mouth daily.  [name] atorvastatin (LIPITOR) 20 MG tablet Take 1 tablet (20 mg total) by mouth daily  at 6 PM.  . Glucosamine-Chondroit-Vit C-Mn (GLUCOSAMINE 1500 COMPLEX PO) Take 2 tablets by mouth daily.  [name] levalbuterol (XOPENEX HFA) 45 MCG/ACT inhaler Inhale 1-2 puffs into the lungs every 6 (six) hours as needed for wheezing.  . metoprolol succinate (TOPROL-XL) 25 MG 24 hr tablet Take 1 tablet (25 mg total) by mouth daily.  . mometasone-formoterol (DULERA) 100-5 MCG/ACT AERO Inhale 2 puffs into the lungs 2 (two) times daily.  . Multiple Vitamins-Minerals (CENTRUM SILVER ADULT 50+ PO) Take 1 capsule by mouth daily.  [name] LORazepam (ATIVAN) 0.5 MG tablet Take 1 tablet (0.5 mg total) by mouth 2 (two) times daily as needed for anxiety.  [name] oxyCODONE (OXY IR/ROXICODONE) 5 MG immediate release tablet Take 1-2 tablets (5-10 mg total) by mouth every 4 (four) hours as needed for moderate pain.   No current facility-administered medications on file prior to visit.    Review of Systems Per HPI unless specifically indicated above    Objective:    BP 104/62  Pulse 68  Temp(Src) 98.3 F (36.8 C) (Oral)  Wt 193 lb 8 oz (87.771 kg)  Physical Exam  Nursing note and vitals reviewed. Constitutional: He appears well-developed and well-nourished. No distress.  HENT:  Mouth/Throat: Oropharynx is clear and moist. No oropharyngeal exudate.  Cardiovascular: Normal rate, normal heart sounds and intact distal pulses.   No murmur heard. Pulmonary/Chest: Effort normal and breath sounds normal. No respiratory distress. He has no wheezes. He has no rales.  Midline  thoracic chest scar  Musculoskeletal: He exhibits no edema.   Results for orders placed in visit on 84/21/03  BASIC METABOLIC PANEL      Result Value Ref Range   Sodium 139  135 - 145 mEq/L   Potassium 4.0  3.5 - 5.1 mEq/L   Chloride 102  96 - 112 mEq/L   CO2 27  19 - 32 mEq/L   Glucose, Bld 56 (*) 70 - 99 mg/dL   BUN 20  6 - 23 mg/dL   Creatinine, Ser 0.7  0.4 - 1.5 mg/dL   Calcium 9.3  8.4 - 10.5 mg/dL   GFR 120.30  >60.00 mL/min    Orthostatics negative in office today.    Assessment & Plan:   Problem List Items Addressed This Visit   Lightheadedness - Primary     Anticipate majority of this issue is from overtreatment of hypertension leading to relative hypotensive episodes. Prior to surgery bp consistently 120/80, now has had several sbp <100 - with resulting malaise and dizziness. Will continue toprol XL 25mg  daily and will split ACEI/hctz to individual components, continue lisinopril 20mg  daily, decrease hctz to 12.5mg  daily. Pt agrees with plan, will continue to monitor bp regularly and notify me with any questions or concerns. Has f/u planned with cardiologist in 5 wks.    Hypertension     Med changes as per below - new regimen will be hctz 12.5mg  daily, lisinopril 20mg  daily, and Toprol XL 25mg  daily. All in am.    Relevant Medications      lisinopril (PRINIVIL,ZESTRIL) tablet      hydrochlorothiazide (MICROZIDE) 12.5 MG capsule   Ex-smoker     Quit smoking 01/2014 prior to CABG.    Coronary atherosclerosis of native coronary artery     Continue aspirin 325mg , statin, ACEI, and B blocker.    Relevant Medications      lisinopril (PRINIVIL,ZESTRIL) tablet      hydrochlorothiazide (MICROZIDE) 12.5 MG capsule   Bladder cancer     Upcoming rpt ablation surgery late 03/2014.        Follow up plan: Return in about 3 months (around 06/11/2014), or as needed, for annual exam, prior fasting for blood work.

## 2014-03-12 NOTE — Assessment & Plan Note (Signed)
Continue aspirin 325mg , statin, ACEI, and B blocker.

## 2014-03-12 NOTE — Assessment & Plan Note (Signed)
Anticipate majority of this issue is from overtreatment of hypertension leading to relative hypotensive episodes. Prior to surgery bp consistently 120/80, now has had several sbp <100 - with resulting malaise and dizziness. Will continue toprol XL 25mg  daily and will split ACEI/hctz to individual components, continue lisinopril 20mg  daily, decrease hctz to 12.5mg  daily. Pt agrees with plan, will continue to monitor bp regularly and notify me with any questions or concerns. Has f/u planned with cardiologist in 5 wks.

## 2014-03-13 ENCOUNTER — Other Ambulatory Visit: Payer: Self-pay | Admitting: Thoracic Surgery (Cardiothoracic Vascular Surgery)

## 2014-03-13 DIAGNOSIS — I25110 Atherosclerotic heart disease of native coronary artery with unstable angina pectoris: Secondary | ICD-10-CM

## 2014-03-19 ENCOUNTER — Ambulatory Visit (INDEPENDENT_AMBULATORY_CARE_PROVIDER_SITE_OTHER): Payer: Self-pay | Admitting: Thoracic Surgery (Cardiothoracic Vascular Surgery)

## 2014-03-19 ENCOUNTER — Encounter: Payer: Self-pay | Admitting: Thoracic Surgery (Cardiothoracic Vascular Surgery)

## 2014-03-19 ENCOUNTER — Ambulatory Visit
Admission: RE | Admit: 2014-03-19 | Discharge: 2014-03-19 | Disposition: A | Discharge status: Home / Self Care | Payer: BC Managed Care – PPO | Source: Ambulatory Visit | Attending: Thoracic Surgery (Cardiothoracic Vascular Surgery) | Admitting: Thoracic Surgery (Cardiothoracic Vascular Surgery)

## 2014-03-19 VITALS — BP 99/71 | HR 70 | Resp 16 | Ht 71.0 in | Wt 193.0 lb

## 2014-03-19 DIAGNOSIS — I25110 Atherosclerotic heart disease of native coronary artery with unstable angina pectoris: Secondary | ICD-10-CM

## 2014-03-19 DIAGNOSIS — Z951 Presence of aortocoronary bypass graft: Secondary | ICD-10-CM

## 2014-03-19 DIAGNOSIS — I2510 Atherosclerotic heart disease of native coronary artery without angina pectoris: Secondary | ICD-10-CM

## 2014-03-19 MED ORDER — CEPHALEXIN 500 MG PO CAPS: 500.0000 mg | ORAL_CAPSULE | Freq: Three times a day (TID) | ORAL | Status: DC

## 2014-03-19 NOTE — Patient Instructions (Signed)
Stop hydrochlorothiazide   Hold Dulera inhaler until after you see Dr. [name]  We will arrange an appointment with speech pathology to evaluate your swallowing issues  Take the Keflex as directed  You may begin driving with precautions as discussed

## 2014-03-19 NOTE — Progress Notes (Signed)
HPI:  Mr. Sheerin returns today for a scheduled postoperative followup visit.  He is a 58 year old gentleman who was noted to have an abnormal EKG on a preoperative evaluation prior to having bladder surgery. He had a stress test which was abnormal and that led to cardiac catheterization, which revealed severe three-vessel coronary disease.  He underwent coronary bypass grafting x3 with bilateral mammaries on July 30. He was extubated early postoperatively, but had severe wheezing and respiratory distress and had to be reintubated that evening. He was subsequently extubated the following day. He continued to have some issues with wheezing. He was discharged on dulera and Xopenex inhalers.  Since discharge has been having problems with lightheadedness. His medications have been adjusted on multiple occasions. His blood pressure has remained low and he continues to feel lightheaded. He has not had any dizziness or presyncope.  He complains of difficulty swallowing his pills, a metallic taste of food, and sometimes coughing when he swallows.  He has some pain along the costal sternal border bilaterally. He is not taking any narcotics for that.  He complained of redness at the lower portion of the sternal incision which started over the weekend. He says that has improved on its own and looks better today.  He is anxious to increase his activities.  He is scheduled to have a bladder tumor removed at the end of this month.  Past Medical History  Diagnosis Date  . Bladder cancer 08/16/2008    [facility] [place] now [place])  . Hypertension   . AAA (abdominal aortic aneurysm) 03/25/12    s/p endovascular repair  . Hx of migraines   . Contact dermatitis     atypical [name])  . Childhood asthma   . Environmental allergies     improved as ages  . Diverticulosis 2013    severe by CT and colonoscopy  . Adenomatous polyp 06/2009  . Lumbosacral radiculopathy at S1 03/2012    left, with spinal  stenosis (MRI 04/2013) improved with TF ESI L5/S1 and S1/2 ([facility])  . Spinal stenosis     S1  . Coronary artery disease       Current Outpatient Prescriptions  Medication Sig Dispense Refill  . aspirin EC 325 MG EC tablet Take 1 tablet (325 mg total) by mouth daily.      [name] atorvastatin (LIPITOR) 20 MG tablet Take 1 tablet (20 mg total) by mouth daily at 6 PM.  30 tablet  1  . Glucosamine-Chondroit-Vit C-Mn (GLUCOSAMINE 1500 COMPLEX PO) Take 2 tablets by mouth daily.      [name] levalbuterol (XOPENEX HFA) 45 MCG/ACT inhaler Inhale 1-2 puffs into the lungs every 6 (six) hours as needed for wheezing.  1 Inhaler  12  . lisinopril (PRINIVIL,ZESTRIL) 20 MG tablet Take 1 tablet (20 mg total) by mouth daily.  30 tablet  3  . metoprolol succinate (TOPROL-XL) 25 MG 24 hr tablet Take 1 tablet (25 mg total) by mouth daily.  90 tablet  3  . mometasone-formoterol (DULERA) 100-5 MCG/ACT AERO Inhale 2 puffs into the lungs 2 (two) times daily.  13 g  1  . Multiple Vitamins-Minerals (CENTRUM SILVER ADULT 50+ PO) Take 1 capsule by mouth daily.      . cephALEXin (KEFLEX) 500 MG capsule Take 1 capsule (500 mg total) by mouth 3 (three) times daily.  21 capsule  0   No current facility-administered medications for this visit.    Physical Exam BP 99/71  Pulse 70  Resp  16  Ht 5\' 11"  (1.803 m)  Wt 193 lb (87.544 kg)  BMI 26.93 kg/m2  SpO1 52% 58 year old man in no acute distress Well-developed and well-nourished HEENT unremarkable No focal neurological deficits Lungs clear with equal breath sounds bilaterally Cardiac regular rate and rhythm normal S1 and S2 Sternum stable, incision clean and dry, mild erythema inferiorly Leg incisions healing well, no edema  Diagnostic Tests: CHEST 2 VIEW  COMPARISON: 02/13/2014  FINDINGS:  Stable changes from CABG surgery. Cardiac silhouette is normal in  size and configuration. No mediastinal or hilar masses. No evidence  of adenopathy.  Mild right lung  base subsegmental atelectasis. Lungs are otherwise  clear. No residual pleural effusions. No pneumothorax.  Bony thorax is demineralized but intact.  Upper end of an aortic stent is evident, stable.  IMPRESSION:  No acute cardiopulmonary disease.  Electronically Signed  By: Lajean Manes M.D.  On: 03/19/2014 11:34   Impression: 58 year old gentleman who is now almost 6 weeks post coronary bypass grafting x3. Overall he is doing well but he does have multiple complaints. His primary complaint has been lightheadedness. His blood pressure medication been adjusted on multiple occasions, but he remains lightheaded and his blood pressure remains low. I recommended that he stop the hydrochlorothiazide altogether. Continue lisinopril and Toprol-XL.  I reassured and is not uncommon for food taste funny and not have much of an appetite. That should improve with time. I am more concerned about his inability to swallow pills and coughing. I am going to set him up with a speech pathology consultation to evaluate his swallowing and rule out aspiration. P. think some of this may be related to the inhaler so these taking. He is not having to use a Xopenex at all. So we go ahead and stop the [facility] and see if that makes a difference. He is to see Dr. [name] next week and they can determine if he needs to be back on that long-term.  I am not sure what to make of his redness at his sternal incision. He does have very mild erythema there. I am not sure what would've caused it and then started to resolve spontaneously. I think to be on the safe side we should treat him with antibiotics.  He may begin driving, appropriate precautions were discussed.  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 It will be at least 3 months before he could go back to that.   Plan: May begin rehabilitation.  Speech pathology consult to assess for possible  aspiration.  Discontinue hydrochlorothiazide.  Stop the dulera inhaler until he is seen by Dr. [name] next week  Keflex 500 mg by mouth 3 times a day for 7 days  Return in one week to check sternal incision.  He may proceed with surgery for bladder tumor as scheduled on September 25

## 2014-03-20 ENCOUNTER — Other Ambulatory Visit: Payer: Self-pay | Admitting: *Deleted

## 2014-03-20 DIAGNOSIS — R198 Other specified symptoms and signs involving the digestive system and abdomen: Secondary | ICD-10-CM

## 2014-03-21 ENCOUNTER — Other Ambulatory Visit (HOSPITAL_COMMUNITY): Payer: Self-pay | Admitting: Thoracic Surgery (Cardiothoracic Vascular Surgery)

## 2014-03-21 DIAGNOSIS — R1310 Dysphagia, unspecified: Secondary | ICD-10-CM

## 2014-03-26 ENCOUNTER — Ambulatory Visit (INDEPENDENT_AMBULATORY_CARE_PROVIDER_SITE_OTHER): Payer: Self-pay | Admitting: Thoracic Surgery (Cardiothoracic Vascular Surgery)

## 2014-03-26 ENCOUNTER — Ambulatory Visit (INDEPENDENT_AMBULATORY_CARE_PROVIDER_SITE_OTHER): Payer: BC Managed Care – PPO | Admitting: Pulmonary Disease

## 2014-03-26 ENCOUNTER — Encounter: Payer: Self-pay | Admitting: Thoracic Surgery (Cardiothoracic Vascular Surgery)

## 2014-03-26 ENCOUNTER — Encounter: Payer: Self-pay | Admitting: Pulmonary Disease

## 2014-03-26 VITALS — BP 114/79 | HR 61 | Resp 16 | Ht 71.0 in | Wt 193.0 lb

## 2014-03-26 VITALS — BP 142/86 | HR 59 | Ht 71.0 in | Wt 196.0 lb

## 2014-03-26 DIAGNOSIS — R198 Other specified symptoms and signs involving the digestive system and abdomen: Secondary | ICD-10-CM

## 2014-03-26 DIAGNOSIS — Z951 Presence of aortocoronary bypass graft: Secondary | ICD-10-CM

## 2014-03-26 DIAGNOSIS — Z87891 Personal history of nicotine dependence: Secondary | ICD-10-CM

## 2014-03-26 DIAGNOSIS — J449 Chronic obstructive pulmonary disease, unspecified: Secondary | ICD-10-CM | POA: Insufficient documentation

## 2014-03-26 DIAGNOSIS — R1310 Dysphagia, unspecified: Secondary | ICD-10-CM

## 2014-03-26 DIAGNOSIS — I2510 Atherosclerotic heart disease of native coronary artery without angina pectoris: Secondary | ICD-10-CM

## 2014-03-26 MED ORDER — TRAMADOL HCL 50 MG PO TABS: 50.0000 mg | ORAL_TABLET | Freq: Four times a day (QID) | ORAL | Status: DC | PRN

## 2014-03-26 NOTE — Assessment & Plan Note (Signed)
Carl Lee has gold grade B COPD. He had an exacerbation while he was hospitalized for his coronary artery bypass grafting but since then he has done well. He stopped taking the [facility] over a week ago and he is feeling okay. Because he never really had symptoms of shortness of breath before his surgery I think that his COPD is actually more mild than his pulmonary function testing would suggest.  The most important thing for him at this point is to avoid infection. Specifically I mean for him to get a flu shot and pneumonia shot regularly.  Plan: -Use Xopenex as needed for shortness of breath -Exercise regularly -Flu shot and pneumonia shot (he prefers to check with Dr. [name] about that this afternoon) -Followup with me in 6 months

## 2014-03-26 NOTE — Progress Notes (Signed)
Subjective:    Patient ID: Carl Lee, male    DOB: 20-Apr-1956, 58 y.o.   MRN: [id number]  Synopsis: Mr. sivils had a CABG in 2015 and pulmonary critical care medicine was consulted for a COPD exacerbation. July 2015 lung function testing showed a ratio 62%, FEV1 of 2.10 L (56% predicted), total lung capacity 6.35 L (80% predicted). He smoked for many years and quit smoking in 2015.  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  He feels like his throat is closed up some and he has a hard time swallowing pills.  He feels like food and saliva are building up in his throat.  He is scheduled to have a modified barium swallow.    His blood pressure has been labile lately and his [facility] Mannan was recently held for concern that this was contributing.  His breathing hasn't changed since then.  He has not needed his rescue inhaler.    His breathing is doing OK.    Past Medical History  Diagnosis Date  . Bladder cancer 08/16/2008    [facility] then [place] now [place])  . Hypertension   . AAA (abdominal aortic aneurysm) 03/25/12    s/p endovascular repair  . Hx of migraines   . Contact dermatitis     atypical [name])  . Childhood asthma   . Environmental allergies     improved as ages  . Diverticulosis 2013    severe by CT and colonoscopy  . Adenomatous polyp 06/2009  . Lumbosacral radiculopathy at S1 03/2012    left, with spinal stenosis (MRI 04/2013) improved with TF ESI L5/S1 and S1/2 ([facility])  . Spinal stenosis     S1  . Coronary artery disease      Review of Systems  Constitutional: Positive for fatigue. Negative for fever and chills.  HENT: Negative for postnasal drip, rhinorrhea and sinus pressure.   Respiratory: Positive for cough. Negative for shortness of breath and wheezing.   Cardiovascular: Negative for chest pain, palpitations and leg swelling.       Objective:   Physical Exam Filed Vitals:   03/26/14 1018    BP: 142/86  Pulse: 59  Height: 5\' 11"  (1.803 m)  Weight: 196 lb (88.905 kg)  SpO2: 96%   RA  Gen: well appearing, no acute distress HEENT: NCAT, EOMi, OP clear,  PULM: CTA B CV: RRR, no mgr, no JVD AB: BS+, soft, nontender, Ext: warm, no edema, no clubbing, no cyanosis Derm: no rash or skin breakdown Neuro: A&Ox4, CN II-XII intact, MAEW     Assessment & Plan:   COPD, moderate Carlson has gold grade B COPD. He had an exacerbation while he was hospitalized for his coronary artery bypass grafting but since then he has done well. He stopped taking the [facility] over a week ago and he is feeling okay. Because he never really had symptoms of shortness of breath before his surgery I think that his COPD is actually more mild than his pulmonary function testing would suggest.  The most important thing for him at this point is to avoid infection. Specifically I mean for him to get a flu shot and pneumonia shot regularly.  Plan: -Use Xopenex as needed for shortness of breath -Exercise regularly -Flu shot and pneumonia shot (he prefers to check with Dr. [name] about that this afternoon) -Followup with me in 6 months  Ex-smoker 30 pack year smoking history, quit in July  2015.  He and I will discuss low-dose lung cancer screening on our next visit.    Updated Medication List Outpatient Encounter Prescriptions as of 03/26/2014  Medication Sig  . aspirin EC 325 MG EC tablet Take 1 tablet (325 mg total) by mouth daily.  [name] atorvastatin (LIPITOR) 20 MG tablet Take 1 tablet (20 mg total) by mouth daily at 6 PM.  . Glucosamine-Chondroit-Vit C-Mn (GLUCOSAMINE 1500 COMPLEX PO) Take 2 tablets by mouth daily.  [name] levalbuterol (XOPENEX HFA) 45 MCG/ACT inhaler Inhale 1-2 puffs into the lungs every 6 (six) hours as needed for wheezing.  [name] lisinopril (PRINIVIL,ZESTRIL) 20 MG tablet Take 1 tablet (20 mg total) by mouth daily.  . metoprolol succinate (TOPROL-XL) 25 MG 24 hr tablet Take 1 tablet (25 mg  total) by mouth daily.  . Multiple Vitamins-Minerals (CENTRUM SILVER ADULT 50+ PO) Take 1 capsule by mouth daily.  . [DISCONTINUED] cephALEXin (KEFLEX) 500 MG capsule Take 1 capsule (500 mg total) by mouth 3 (three) times daily.  . [DISCONTINUED] mometasone-formoterol (DULERA) 100-5 MCG/ACT AERO Inhale 2 puffs into the lungs 2 (two) times daily.

## 2014-03-26 NOTE — Assessment & Plan Note (Signed)
30 pack year smoking history, quit in July 2015.  He and I will discuss low-dose lung cancer screening on our next visit.

## 2014-03-26 NOTE — Patient Instructions (Signed)
Ask Dr. [name] about changing lisinopril to Losartan Get a flu shot and a pneumonia shot after you see Dr. [name] Take Xopenex 2 puffs every 4-6 hours as needed for shortness of breath We will see you back in 6 months or sooner if needed

## 2014-03-26 NOTE — Progress Notes (Signed)
HPI:  Mr. Cozort returns today for a scheduled follow up visit  03/19/2104 He is a 58 year old gentleman who was noted to have an abnormal EKG on a preoperative evaluation prior to having bladder surgery. He had a stress test which was abnormal and that led to cardiac catheterization, which revealed severe three-vessel coronary disease.   He underwent coronary bypass grafting x3 with bilateral mammaries on July 30. He was extubated early postoperatively, but had severe wheezing and respiratory distress and had to be reintubated that evening. He was subsequently extubated the following day. He continued to have some issues with wheezing. He was discharged on dulera and Xopenex inhalers.   Since discharge has been having problems with lightheadedness. His medications have been adjusted on multiple occasions. His blood pressure has remained low and he continues to feel lightheaded. He has not had any dizziness or presyncope.   He complains of difficulty swallowing his pills, a metallic taste of food, and sometimes coughing when he swallows.  03/26/2014  Mr. Burgueno says that he has not noted any significant improvement in his swallowing. He is scheduled to have a barium swallow Thursday and then will see speech pathology. He says that he had another episode of lightheadedness in the interim since his last visit. He says his blood pressures gone up since he stopped hydrochlorothiazide. He is extremely concerned about redness around his sternal incision. He's developed a new area of redness on his lower abdomen.  Past Medical History  Diagnosis Date  . Bladder cancer 08/16/2008    [facility] then [place] now [place])  . Hypertension   . AAA (abdominal aortic aneurysm) 03/25/12    s/p endovascular repair  . Hx of migraines   . Contact dermatitis     atypical [name])  . Childhood asthma   . Environmental allergies     improved as ages  . Diverticulosis 2013    severe by CT and colonoscopy  .  Adenomatous polyp 06/2009  . Lumbosacral radiculopathy at S1 03/2012    left, with spinal stenosis (MRI 04/2013) improved with TF ESI L5/S1 and S1/2 ([facility])  . Spinal stenosis     S1  . Coronary artery disease      Current Outpatient Prescriptions  Medication Sig Dispense Refill  . aspirin EC 325 MG EC tablet Take 1 tablet (325 mg total) by mouth daily.      [name] atorvastatin (LIPITOR) 20 MG tablet Take 1 tablet (20 mg total) by mouth daily at 6 PM.  30 tablet  1  . Glucosamine-Chondroit-Vit C-Mn (GLUCOSAMINE 1500 COMPLEX PO) Take 2 tablets by mouth daily.      [name] levalbuterol (XOPENEX HFA) 45 MCG/ACT inhaler Inhale 1-2 puffs into the lungs every 6 (six) hours as needed for wheezing.  1 Inhaler  12  . lisinopril (PRINIVIL,ZESTRIL) 20 MG tablet Take 1 tablet (20 mg total) by mouth daily.  30 tablet  3  . metoprolol succinate (TOPROL-XL) 25 MG 24 hr tablet Take 1 tablet (25 mg total) by mouth daily.  90 tablet  3  . Multiple Vitamins-Minerals (CENTRUM SILVER ADULT 50+ PO) Take 1 capsule by mouth daily.      . traMADol (ULTRAM) 50 MG tablet Take 1 tablet (50 mg total) by mouth every 6 (six) hours as needed.  40 tablet  0   No current facility-administered medications for this visit.    Physical Exam BP 114/79  Pulse 61  Resp 16  Ht 5\' 11"  (1.803 m)  Wt 193  lb (87.544 kg)  BMI 26.93 kg/m2  SpO49 41% 58 year old man anxious, but in no acute distress Alert and oriented x3 no focal deficits ENT-throat clear Cardiac regular rate and rhythm normal S1 and S2 Lungs clear equal breath sounds bilaterally, no wheezing Sternum stable, sternal incision clean dry and intact, wound healing well without sign of infection Maculopapular rash below umbilicus  Diagnostic Tests: none  Impression: 58 year old gentleman who is about 6 weeks out from coronary bypass grafting. He has multiple concerns.  1. He complains of redness at the sternal incision. On exam the wound appears to be healing  normally there is no sign of infection.  2. New rash below the umbilicus on his abdomen. This is a dry skin rash. It is relatively localized. If not so localized I would suspect a drug rash. Recommended they apply hydrocortisone cream to the area.  3. Lightheadedness. He has had additional episodes since his last visit a week ago. Stopping the hydrochlorothiazide does not seem to have improved the problem.  4. Hypertension. He's been checking his blood pressure at home. He had a reading of 159/97 this morning and is extremely concerned about that. I was concerned about isolated high blood pressure readings. His blood pressure was fine the office today but due to his anxiety over the issue we will restart his hydrochlorothiazide.  5. Swallowing difficulty persists. He is to have a barium swallow on Thursday and we'll see speech pathology after that. There is no evidence of thrush on his exam.  6. The question whether the lisinopril could be contributing to his swallowing issues. I think it is unlikely, because he had been on that for about 2 years. Recommended no change in at this time.   Plan: Return in 3 weeks

## 2014-03-28 ENCOUNTER — Ambulatory Visit (HOSPITAL_COMMUNITY)
Admission: RE | Admit: 2014-03-28 | Discharge: 2014-03-28 | Disposition: A | Discharge status: Home / Self Care | Payer: BC Managed Care – PPO | Source: Ambulatory Visit | Attending: Thoracic Surgery (Cardiothoracic Vascular Surgery) | Admitting: Thoracic Surgery (Cardiothoracic Vascular Surgery)

## 2014-03-28 DIAGNOSIS — I10 Essential (primary) hypertension: Secondary | ICD-10-CM | POA: Insufficient documentation

## 2014-03-28 DIAGNOSIS — R1310 Dysphagia, unspecified: Secondary | ICD-10-CM

## 2014-03-28 DIAGNOSIS — R062 Wheezing: Secondary | ICD-10-CM | POA: Diagnosis not present

## 2014-03-28 DIAGNOSIS — I2510 Atherosclerotic heart disease of native coronary artery without angina pectoris: Secondary | ICD-10-CM | POA: Diagnosis not present

## 2014-03-28 DIAGNOSIS — Z8551 Personal history of malignant neoplasm of bladder: Secondary | ICD-10-CM | POA: Diagnosis not present

## 2014-03-28 DIAGNOSIS — K5730 Diverticulosis of large intestine without perforation or abscess without bleeding: Secondary | ICD-10-CM | POA: Diagnosis not present

## 2014-03-28 DIAGNOSIS — IMO0002 Reserved for concepts with insufficient information to code with codable children: Secondary | ICD-10-CM | POA: Diagnosis not present

## 2014-03-28 DIAGNOSIS — R198 Other specified symptoms and signs involving the digestive system and abdomen: Secondary | ICD-10-CM

## 2014-03-28 DIAGNOSIS — Z951 Presence of aortocoronary bypass graft: Secondary | ICD-10-CM | POA: Insufficient documentation

## 2014-03-28 NOTE — Procedures (Signed)
Objective Swallowing Evaluation: Modified Barium Swallowing Study  Patient Details  Name: Carl Lee MRN: [id number] Date of Birth: 1955-07-21  Today's Date: 03/28/2014 Time: 1200-1222 SLP Time Calculation (min): 22 min  Past Medical History:  Past Medical History  Diagnosis Date  . Bladder cancer 08/16/2008    [facility] then [place] now [place])  . Hypertension   . AAA (abdominal aortic aneurysm) 03/25/12    s/p endovascular repair  . Hx of migraines   . Contact dermatitis     atypical [name])  . Childhood asthma   . Environmental allergies     improved as ages  . Diverticulosis 2013    severe by CT and colonoscopy  . Adenomatous polyp 06/2009  . Lumbosacral radiculopathy at S1 03/2012    left, with spinal stenosis (MRI 04/2013) improved with TF ESI L5/S1 and S1/2 ([place])  . Spinal stenosis     S1  . Coronary artery disease    Past Surgical History:  Past Surgical History  Procedure Laterality Date  . Tonsillectomy and adenoidectomy  1973  . Vasectomy    . Bladder cancer      [facility])  . Abdominal aortic aneurysm repair  03/25/12  . Colonoscopy  06/2012    hyperplastic polyp, diverticulosis ([facility]) rec rpt 5 yrs  . Cystoscopy  08/16/08    Bladder Cancer  . Esi  04/2013, 06/2013    L L5S1, S12 transforaminal ESI (Dr.  [name])  . Abi  05/2013    WNL, L TBI low at 0.66  . Coronary artery bypass graft N/A 02/07/2014    Procedure: CORONARY ARTERY BYPASS GRAFTING (CABG);  Surgeon: [name], MD;  Location: [facility];  Service: Open Heart Surgery;  Laterality: N/A;  CABG X 3, BILATERAL LIMA, [facility]  . Intraoperative transesophageal echocardiogram N/A 02/07/2014    Procedure: INTRAOPERATIVE TRANSESOPHAGEAL ECHOCARDIOGRAM;  Surgeon: [name], MD;  Location: [facility];  Service: Open Heart Surgery;  Laterality: N/A;   HPI:  58 year old gentleman who is about 6 weeks out from coronary bypass grafting July 30. He was extubated early postoperatively,  but had severe wheezing and respiratory distress and had to be reintubated that evening. He was subsequently extubated the following day. He continued to have some issues with wheezing. He has reported difficulty swallowing, particularly pills, following d/c from hospital. He also reports expectoration of mucous. Pt had a recent CXR that was clear.      Assessment / Plan / Recommendation Clinical Impression  Dysphagia Diagnosis: Mild cervical esophageal phase dysphagia;Suspected primary esophageal dysphagia Clinical impression: Pt presents with a grossly normal oral and oropharyngeal swallow. He does have a mild cervical esophageal dysphagia with a hypertensive UES with a small Zenker's Diverticulum, diagnosed by Dr. Garald Balding during the study. Due to hypertension and residuals in the Zenkers, there is trace backflow to the pyriforms post swallow. Head turns, tilts and tucks were not effective in preventing or removing residuals in Zenker's. No penetration or aspiration occurred though pt does have frequent hard coughing following swallowing. He also expectorated a small quantity of mucous.   Given multiple signs concerning for a primary esophageal dysphagia (coughing without aspiration, mucous, belching, hypertensive UES often caused by reflux), suggest referral to gastroenterologist for further assessment. Pt may also need eventual referral to ENT to address intervention with Zenker's diverticulum. No further need for SLP f/u, pt may continue a regular diet with thin liquids with consults for medical management as needed.     Treatment Recommendation  No treatment recommended at this time    Diet Recommendation Regular;Thin liquid   Liquid Administration via: Cup;Straw Medication Administration: Crushed with puree (to prevent pill from lodging in UES/Zenker's) Supervision: Patient able to self feed Postural Changes and/or Swallow Maneuvers: Seated upright 90 degrees;Upright 30-60 min after meal     Other  Recommendations Recommended Consults: Consider GI evaluation;Consider ENT evaluation Oral Care Recommendations: Patient independent with oral care   Follow Up Recommendations  None    Frequency and Duration        Pertinent Vitals/Pain NA    SLP Swallow Goals     General HPI: 58 year old gentleman who is about 6 weeks out from coronary bypass grafting July 30. He was extubated early postoperatively, but had severe wheezing and respiratory distress and had to be reintubated that evening. He was subsequently extubated the following day. He continued to have some issues with wheezing. He has reported difficulty swallowing, particularly pills, following d/c from hospital. He also reports expectoration of mucous. Pt had a recent CXR that was clear.  Type of Study: Modified Barium Swallowing Study Reason for Referral: Objectively evaluate swallowing function Diet Prior to this Study: Regular;Thin liquids Temperature Spikes Noted: N/A Respiratory Status: Room air History of Recent Intubation: Yes Length of Intubations (days):  (multiple intubations, 6 weeks ago) Behavior/Cognition: Alert;Cooperative;Pleasant mood Oral Cavity - Dentition: Adequate natural dentition Oral Motor / Sensory Function: Within functional limits Self-Feeding Abilities: Able to feed self Patient Positioning: Upright in chair Baseline Vocal Quality: Clear Volitional Cough: Strong Volitional Swallow: Able to elicit Anatomy: Within functional limits Pharyngeal Secretions: Not observed secondary MBS    Reason for Referral Objectively evaluate swallowing function   Oral Phase Oral Preparation/Oral Phase Oral Phase: WFL   Pharyngeal Phase Pharyngeal Phase Pharyngeal Phase: Within functional limits  Cervical Esophageal Phase    GO    Cervical Esophageal Phase Cervical Esophageal Phase: Impaired Cervical Esophageal Phase - Comment Cervical Esophageal Comment: see impression  statement    Functional Assessment Tool Used: clinical judgement Functional Limitations: Swallowing Swallow Current Status (K2706): At least 1 percent but less than 20 percent impaired, limited or restricted Swallow Goal Status [phone]): At least 1 percent but less than 20 percent impaired, limited or restricted Swallow Discharge Status [phone]): At least 1 percent but less than 20 percent impaired, limited or restricted   [facility], [place] CCC-SLP [phone]  Lynann Beaver 03/28/2014, 1:32 PM

## 2014-04-02 ENCOUNTER — Ambulatory Visit: Payer: BC Managed Care – PPO

## 2014-04-09 ENCOUNTER — Ambulatory Visit: Payer: BC Managed Care – PPO | Admitting: Thoracic Surgery (Cardiothoracic Vascular Surgery)

## 2014-04-11 ENCOUNTER — Encounter (HOSPITAL_COMMUNITY)
Admission: RE | Admit: 2014-04-11 | Discharge: 2014-04-11 | Disposition: A | Discharge status: Home / Self Care | Payer: BC Managed Care – PPO | Source: Ambulatory Visit | Attending: Cardiovascular Disease | Admitting: Cardiovascular Disease

## 2014-04-11 ENCOUNTER — Telehealth (HOSPITAL_COMMUNITY): Payer: Self-pay | Admitting: *Deleted

## 2014-04-11 DIAGNOSIS — C679 Malignant neoplasm of bladder, unspecified: Secondary | ICD-10-CM | POA: Insufficient documentation

## 2014-04-11 DIAGNOSIS — I739 Peripheral vascular disease, unspecified: Secondary | ICD-10-CM | POA: Insufficient documentation

## 2014-04-11 DIAGNOSIS — Z7982 Long term (current) use of aspirin: Secondary | ICD-10-CM | POA: Insufficient documentation

## 2014-04-11 DIAGNOSIS — E785 Hyperlipidemia, unspecified: Secondary | ICD-10-CM | POA: Insufficient documentation

## 2014-04-11 DIAGNOSIS — J449 Chronic obstructive pulmonary disease, unspecified: Secondary | ICD-10-CM | POA: Insufficient documentation

## 2014-04-11 DIAGNOSIS — J9610 Chronic respiratory failure, unspecified whether with hypoxia or hypercapnia: Secondary | ICD-10-CM | POA: Insufficient documentation

## 2014-04-11 DIAGNOSIS — Z951 Presence of aortocoronary bypass graft: Secondary | ICD-10-CM | POA: Insufficient documentation

## 2014-04-11 DIAGNOSIS — E780 Pure hypercholesterolemia: Secondary | ICD-10-CM | POA: Insufficient documentation

## 2014-04-11 DIAGNOSIS — I10 Essential (primary) hypertension: Secondary | ICD-10-CM | POA: Insufficient documentation

## 2014-04-11 DIAGNOSIS — I2582 Chronic total occlusion of coronary artery: Secondary | ICD-10-CM | POA: Insufficient documentation

## 2014-04-11 DIAGNOSIS — Z5189 Encounter for other specified aftercare: Secondary | ICD-10-CM | POA: Insufficient documentation

## 2014-04-11 DIAGNOSIS — Z8249 Family history of ischemic heart disease and other diseases of the circulatory system: Secondary | ICD-10-CM | POA: Insufficient documentation

## 2014-04-11 DIAGNOSIS — F419 Anxiety disorder, unspecified: Secondary | ICD-10-CM | POA: Insufficient documentation

## 2014-04-11 DIAGNOSIS — I200 Unstable angina: Secondary | ICD-10-CM | POA: Insufficient documentation

## 2014-04-11 DIAGNOSIS — Z885 Allergy status to narcotic agent status: Secondary | ICD-10-CM | POA: Insufficient documentation

## 2014-04-11 DIAGNOSIS — Z79899 Other long term (current) drug therapy: Secondary | ICD-10-CM | POA: Insufficient documentation

## 2014-04-11 DIAGNOSIS — I2510 Atherosclerotic heart disease of native coronary artery without angina pectoris: Secondary | ICD-10-CM | POA: Insufficient documentation

## 2014-04-11 DIAGNOSIS — F17200 Nicotine dependence, unspecified, uncomplicated: Secondary | ICD-10-CM | POA: Insufficient documentation

## 2014-04-11 NOTE — Progress Notes (Signed)
Cardiac Rehab Medication Review by a Pharmacist  Does the patient  feel that his/her medications are working for him/her?  No BP is still not controlled very well. Sometimes it is high and other times it is low.  Has the patient been experiencing any side effects to the medications prescribed?  no  Does the patient measure his/her own blood pressure?  yes Usually in the morning and afternoon.   Does the patient have any problems obtaining medications due to transportation or finances?   no  Understanding of regimen: good Understanding of indications: fair Potential of compliance: good  Pharmacist comments: Pt reports being compliant with all of his medications and taking them as prescribed. Pt has a good understanding of his medications. Pt does report spending a lot of money out of pocket this year and may need assistance soon if he is not able to get back to work.  Reginia Naas 04/11/2014 8:45 AM

## 2014-04-11 NOTE — Telephone Encounter (Signed)
Message copied by Rowe Pavy on Thu Apr 11, 2014  3:06 PM ------      Message from: Josue Hector      Created: Thu Apr 11, 2014  2:50 PM      Regarding: RE: AAA BP parameters       That would be fine as I do not feel there are really any different limitations for him            ----- Message -----         From: Rowe Pavy, RN         Sent: 04/11/2014   1:55 PM           To: Josue Hector, MD      Subject: RE: AAA BP parameters                                    We did get the okay from [facility] to participate in rehab. However we also needed bp parameters, equipment selection, and hand weight limitations.  Generally we receive this input from the referring and ongoing contact provider during his participation in rehab.  If you prefer I can send the form to be completed by [facility].      Your thoughts?      Thanks      Mikya Don      ----- Message -----         From: Josue Hector, MD         Sent: 04/11/2014   1:37 PM           To: Rowe Pavy, RN      Subject: RE: AAA BP parameters                                    Do not hold start of rehab she is fine to participate Would seem that it would be more appropriate for the vascular person in this case Dr [name] to sign off on participtating with the AAA since they follow it            ----- Message -----         From: Rowe Pavy, RN         Sent: 04/11/2014  11:08 AM           To: Josue Hector, MD      Subject: AAA BP parameters                                        Dr. [name],            Pt in today for orientation today s/p CABG x 3 lima 7/30.  Pt with history of AAA repair in 2013 with remaining 4.5 aneurysm. Pt is followed by Dr. [name] and was last seen in office on 11/12/13 - stable (plan to see annually).  I understand you are in the office today. I have sent AAA parameters by fax to your office for completion.  We will hold the start of exercise.            Thanks so much for your input  Carl Lee                   ------

## 2014-04-12 ENCOUNTER — Other Ambulatory Visit: Payer: Self-pay | Admitting: Thoracic Surgery (Cardiothoracic Vascular Surgery)

## 2014-04-12 DIAGNOSIS — I25119 Atherosclerotic heart disease of native coronary artery with unspecified angina pectoris: Secondary | ICD-10-CM

## 2014-04-15 ENCOUNTER — Encounter (HOSPITAL_COMMUNITY): Admission: RE | Admit: 2014-04-15 | Payer: BC Managed Care – PPO | Source: Ambulatory Visit

## 2014-04-15 ENCOUNTER — Other Ambulatory Visit (INDEPENDENT_AMBULATORY_CARE_PROVIDER_SITE_OTHER): Payer: BC Managed Care – PPO | Admitting: *Deleted

## 2014-04-15 DIAGNOSIS — I2510 Atherosclerotic heart disease of native coronary artery without angina pectoris: Secondary | ICD-10-CM

## 2014-04-15 DIAGNOSIS — E785 Hyperlipidemia, unspecified: Secondary | ICD-10-CM

## 2014-04-15 LAB — LIPID PANEL
Cholesterol: 140 mg/dL (ref 0–200)
HDL: 40.7 mg/dL (ref >39.00)
LDL Cholesterol: 66 mg/dL (ref 0–99)
NONHDL: 99.3
TRIGLYCERIDES: 168 mg/dL — AB (ref 0.0–149.0)
Total CHOL/HDL Ratio: 3
VLDL: 33.6 mg/dL (ref 0.0–40.0)

## 2014-04-15 LAB — HEPATIC FUNCTION PANEL
ALK PHOS: 85 U/L (ref 39–117)
ALT: 10 U/L (ref 0–53)
AST: 18 U/L (ref 0–37)
Albumin: 4 g/dL (ref 3.5–5.2)
BILIRUBIN TOTAL: 0.7 mg/dL (ref 0.2–1.2)
Bilirubin, Direct: 0.1 mg/dL (ref 0.0–0.3)
Total Protein: 7.1 g/dL (ref 6.0–8.3)

## 2014-04-16 ENCOUNTER — Other Ambulatory Visit: Payer: Self-pay | Admitting: Thoracic Surgery (Cardiothoracic Vascular Surgery)

## 2014-04-16 ENCOUNTER — Telehealth: Payer: Self-pay | Admitting: *Deleted

## 2014-04-16 ENCOUNTER — Ambulatory Visit (INDEPENDENT_AMBULATORY_CARE_PROVIDER_SITE_OTHER): Payer: Self-pay | Admitting: Thoracic Surgery (Cardiothoracic Vascular Surgery)

## 2014-04-16 ENCOUNTER — Ambulatory Visit
Admission: RE | Admit: 2014-04-16 | Discharge: 2014-04-16 | Disposition: A | Discharge status: Home / Self Care | Payer: BC Managed Care – PPO | Source: Ambulatory Visit | Attending: Thoracic Surgery (Cardiothoracic Vascular Surgery) | Admitting: Thoracic Surgery (Cardiothoracic Vascular Surgery)

## 2014-04-16 ENCOUNTER — Encounter: Payer: Self-pay | Admitting: Thoracic Surgery (Cardiothoracic Vascular Surgery)

## 2014-04-16 VITALS — BP 125/82 | HR 71 | Ht 69.0 in | Wt 195.0 lb

## 2014-04-16 DIAGNOSIS — Z951 Presence of aortocoronary bypass graft: Secondary | ICD-10-CM

## 2014-04-16 DIAGNOSIS — I25119 Atherosclerotic heart disease of native coronary artery with unspecified angina pectoris: Secondary | ICD-10-CM

## 2014-04-16 NOTE — Telephone Encounter (Signed)
pt's wife notified about lab results with verbal understanding, confirmed appt 10/7 w/Dr. [name].

## 2014-04-16 NOTE — Progress Notes (Signed)
HPI:  Mr. Nahar returns today for a scheduled followup visit.  He is a 58 year old gentleman who was noted to have an abnormal EKG on a preoperative evaluation prior to having bladder surgery. He had a stress test which was abnormal and that led to cardiac catheterization, which revealed severe three-vessel coronary disease.   He underwent coronary bypass grafting x3 with bilateral mammaries on July 30. He was extubated early postoperatively, but had severe wheezing and respiratory distress and had to be reintubated that evening. He was subsequently extubated the following day. He continued to have some issues with wheezing. He was discharged on dulera and Xopenex inhalers.   I saw him on 9/8 and then again on 9/15. He had multiple complaints including redness around his sternal incision, lightheadedness, difficulty swallowing, a rash on his abdomen, and incisional pain.  We did a barium swallow on him. It showed some primary esophageal dysphasia and a small Zenker's diverticulum. He was treated with Keflex at one point for his sternal incision. The redness has resolved for the most part. Dr. [name] has been managing his COPD. He did have his bladder surgery.  His pain has improved. He does still have some incisional pain but does not take any narcotics. He says primarily it is when he lays flat on his back that he notices the pain. He says that he can work all day but is very fatigued at the end of the day.  Past Medical History  Diagnosis Date  . Bladder cancer 08/16/2008    [facility] then [place] now [place])  . Hypertension   . AAA (abdominal aortic aneurysm) 03/25/12    s/p endovascular repair  . Hx of migraines   . Contact dermatitis     atypical [name])  . Childhood asthma   . Environmental allergies     improved as ages  . Diverticulosis 2013    severe by CT and colonoscopy  . Adenomatous polyp 06/2009  . Lumbosacral radiculopathy at S1 03/2012    left, with spinal stenosis  (MRI 04/2013) improved with TF ESI L5/S1 and S1/2 ([facility])  . Spinal stenosis     S1  . Coronary artery disease        Current Outpatient Prescriptions  Medication Sig Dispense Refill  . aspirin EC 325 MG EC tablet Take 1 tablet (325 mg total) by mouth daily.      [name] atorvastatin (LIPITOR) 20 MG tablet Take 1 tablet (20 mg total) by mouth daily at 6 PM.  30 tablet  1  . Glucosamine-Chondroit-Vit C-Mn (GLUCOSAMINE 1500 COMPLEX PO) Take 2 tablets by mouth daily.      . hydrochlorothiazide (MICROZIDE) 12.5 MG capsule Take 12.5 mg by mouth daily.      [name] levalbuterol (XOPENEX HFA) 45 MCG/ACT inhaler Inhale 1-2 puffs into the lungs every 6 (six) hours as needed for wheezing.  1 Inhaler  12  . lisinopril (PRINIVIL,ZESTRIL) 20 MG tablet Take 1 tablet (20 mg total) by mouth daily.  30 tablet  3  . metoprolol succinate (TOPROL-XL) 25 MG 24 hr tablet Take 1 tablet (25 mg total) by mouth daily.  90 tablet  3  . Multiple Vitamins-Minerals (CENTRUM SILVER ADULT 50+ PO) Take 1 capsule by mouth daily.      . DULERA 100-5 MCG/ACT AERO       . traMADol (ULTRAM) 50 MG tablet Take 1 tablet (50 mg total) by mouth every 6 (six) hours as needed.  40 tablet  0   No  current facility-administered medications for this visit.    Physical Exam BP 125/82  Pulse 71  Ht 5\' 9"  (1.753 m)  Wt 195 lb (88.451 kg)  BMI 28.78 kg/m2  SpO5 60% 58 year old man in no acute distress Alert and oriented x3 with no focal neurologic deficits Sternal incision well-healed, sternum stable Lungs clear with equal breath sounds bilaterally No peripheral edema  Diagnostic Tests: CHEST 2 VIEW  COMPARISON: 03/19/2014  FINDINGS:  Sternotomy wires are unchanged. Lungs are adequately inflated  without consolidation or effusion. There is flattening of the  hemidiaphragms on the lateral film. Cardiomediastinal silhouette and  remainder of the exam is unchanged.  IMPRESSION:  No active cardiopulmonary disease.   Electronically Signed  By: Marin Olp M.D.  On: 04/16/2014 10:20  Assessment / Plan / Recommendation  Clinical Impression  Dysphagia Diagnosis: Mild cervical esophageal phase dysphagia;Suspected primary esophageal dysphagia  Clinical impression: Pt presents with a grossly normal oral and oropharyngeal swallow. He does have a mild cervical esophageal dysphagia with a hypertensive UES with a small Zenker's Diverticulum, diagnosed by Dr. Garald Balding during the study. Due to hypertension and residuals in the Zenkers, there is trace backflow to the pyriforms post swallow. Head turns, tilts and tucks were not effective in preventing or removing residuals in Zenker's. No penetration or aspiration occurred though pt does have frequent hard coughing following swallowing. He also expectorated a small quantity of mucous.  Given multiple signs concerning for a primary esophageal dysphagia (coughing without aspiration, mucous, belching, hypertensive UES often caused by reflux), suggest referral to gastroenterologist for further assessment. Pt may also need eventual referral to ENT to address intervention with Zenker's diverticulum. No further need for SLP f/u, pt may continue a regular diet with thin liquids with consults for medical management as needed.   Treatment Recommendation  No treatment recommended at this time   Diet Recommendation  Regular;Thin liquid  Liquid Administration via: Cup;Straw  Medication Administration: Crushed with puree (to prevent pill from lodging in UES/Zenker's)  Supervision: Patient able to self feed  Postural Changes and/or Swallow Maneuvers: Seated upright 90 degrees;Upright 30-60 min after meal   Other Recommendations  Recommended Consults: Consider GI evaluation;Consider ENT evaluation  Oral Care Recommendations: Patient independent with oral care   Follow Up Recommendations  None   Frequency and Duration     Pertinent Vitals/Pain  NA     Impression: 58 year old man who  is now about 2 months out from coronary bypass grafting x3. He is recovering slowly. He still has some incisional pain but is no longer requiring narcotics. His exercise tolerance is good, but he does get fatigued and tired during the course of the day. That is not surprising given his back-to-back surgical procedures.  He was having issues with swallowing. He has some upper esophageal sphincter issues and a small Zenker's diverticulum. He has seen Dr. Ardis Hughs from gastroenterology in the past. We'll have him get set up to followup with him.  He says that his lightheadedness is improved. He is complaining of having headaches in the morning and his blood pressure is elevated the mornings and low at night. He has an appointment with Dr. [name] tomorrow, so I did not make any changes to his medical regimen.  At this point his activities are unrestricted from my standpoint. I think he is fine to start cardiac rehabilitation. Apparently he is waiting on some blood pressure parameters from Dr. [name] before he can be cleared.  Plan:  I will be happy to see  Mr. Samples back at any time in the future if I can be of any further assistance with his care.

## 2014-04-17 ENCOUNTER — Encounter (HOSPITAL_COMMUNITY): Payer: Self-pay

## 2014-04-17 ENCOUNTER — Encounter: Payer: Self-pay | Admitting: Cardiovascular Disease

## 2014-04-17 ENCOUNTER — Encounter: Payer: Self-pay | Admitting: *Deleted

## 2014-04-17 ENCOUNTER — Ambulatory Visit (INDEPENDENT_AMBULATORY_CARE_PROVIDER_SITE_OTHER): Payer: BC Managed Care – PPO | Admitting: Cardiovascular Disease

## 2014-04-17 ENCOUNTER — Encounter (HOSPITAL_COMMUNITY)
Admission: RE | Admit: 2014-04-17 | Discharge: 2014-04-17 | Disposition: A | Discharge status: Home / Self Care | Payer: BC Managed Care – PPO | Source: Ambulatory Visit | Attending: Cardiovascular Disease | Admitting: Cardiovascular Disease

## 2014-04-17 ENCOUNTER — Telehealth: Payer: Self-pay | Admitting: Gastroenterology

## 2014-04-17 VITALS — BP 130/74 | HR 75 | Ht 71.0 in | Wt 197.1 lb

## 2014-04-17 DIAGNOSIS — C679 Malignant neoplasm of bladder, unspecified: Secondary | ICD-10-CM | POA: Diagnosis not present

## 2014-04-17 DIAGNOSIS — J449 Chronic obstructive pulmonary disease, unspecified: Secondary | ICD-10-CM

## 2014-04-17 DIAGNOSIS — F419 Anxiety disorder, unspecified: Secondary | ICD-10-CM | POA: Diagnosis not present

## 2014-04-17 DIAGNOSIS — F17200 Nicotine dependence, unspecified, uncomplicated: Secondary | ICD-10-CM | POA: Diagnosis not present

## 2014-04-17 DIAGNOSIS — E780 Pure hypercholesterolemia: Secondary | ICD-10-CM | POA: Diagnosis not present

## 2014-04-17 DIAGNOSIS — I2510 Atherosclerotic heart disease of native coronary artery without angina pectoris: Secondary | ICD-10-CM | POA: Diagnosis not present

## 2014-04-17 DIAGNOSIS — J9610 Chronic respiratory failure, unspecified whether with hypoxia or hypercapnia: Secondary | ICD-10-CM | POA: Diagnosis not present

## 2014-04-17 DIAGNOSIS — Z951 Presence of aortocoronary bypass graft: Secondary | ICD-10-CM | POA: Diagnosis not present

## 2014-04-17 DIAGNOSIS — I10 Essential (primary) hypertension: Secondary | ICD-10-CM

## 2014-04-17 DIAGNOSIS — I739 Peripheral vascular disease, unspecified: Secondary | ICD-10-CM | POA: Diagnosis not present

## 2014-04-17 DIAGNOSIS — Z95828 Presence of other vascular implants and grafts: Secondary | ICD-10-CM

## 2014-04-17 DIAGNOSIS — Z885 Allergy status to narcotic agent status: Secondary | ICD-10-CM | POA: Diagnosis not present

## 2014-04-17 DIAGNOSIS — Z79899 Other long term (current) drug therapy: Secondary | ICD-10-CM | POA: Diagnosis not present

## 2014-04-17 DIAGNOSIS — Z5189 Encounter for other specified aftercare: Secondary | ICD-10-CM | POA: Diagnosis not present

## 2014-04-17 DIAGNOSIS — I200 Unstable angina: Secondary | ICD-10-CM | POA: Diagnosis not present

## 2014-04-17 DIAGNOSIS — I2582 Chronic total occlusion of coronary artery: Secondary | ICD-10-CM | POA: Diagnosis not present

## 2014-04-17 DIAGNOSIS — Z7982 Long term (current) use of aspirin: Secondary | ICD-10-CM | POA: Diagnosis not present

## 2014-04-17 DIAGNOSIS — E785 Hyperlipidemia, unspecified: Secondary | ICD-10-CM

## 2014-04-17 DIAGNOSIS — Z8249 Family history of ischemic heart disease and other diseases of the circulatory system: Secondary | ICD-10-CM | POA: Diagnosis not present

## 2014-04-17 LAB — GLUCOSE, CAPILLARY
GLUCOSE-CAPILLARY: 93 mg/dL (ref 70–99)
Glucose-Capillary: 100 mg/dL — ABNORMAL HIGH (ref 70–99)

## 2014-04-17 NOTE — Progress Notes (Addendum)
Pt started cardiac rehab today.  Pt tolerated light exercise without difficulty.  VSS,telemetry-NSR.  Asymptomatic. PHQ-0.  Pt reports significant stressors with death of both parents, inlaws and sister in law within past 5 years.  He feels this has been difficult for himself and his wife and he is concerned about his wife's transition from these losses. Pt is frustrated with the physical changes and role changes since his surgery. Pt offered emotional support, reassurance and referral to counselor as needed.  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  At pt request, vocational rehab packet given to pt.  Pt reports he stopped smoking 02/05/2014.  Pt states this has not been difficult for him and he denies strong urges or cravings. Pt states he is motivated to maintain cessation for his health.  Pt instructed to call [phone], offered emotional support and reassurance.   Otherwise demonstrates good coping skills and positive outlook. Pt cardiac rehab goals are to feel good again and back to his pre op strength.  Pt enjoys fishing and target shooting.   Pt oriented to exercise equipment and routine.  Understanding verbalized.

## 2014-04-17 NOTE — Patient Instructions (Signed)
Your physician wants you to follow-up in:   3 MONTHS WITH  DR [name]  You will receive a reminder letter in the mail two months in advance. If you don't receive a letter, please call our office to schedule the follow-up appointment. Your physician recommends that you continue on your current medications as directed. Please refer to the Current Medication list given to you today.  

## 2014-04-17 NOTE — Assessment & Plan Note (Signed)
F/U US per [facility]  No palpable mass residual aneurysm sac 4.4 cm 5/15

## 2014-04-17 NOTE — Progress Notes (Signed)
Patient ID: Carl Lee, male   DOB: January 16, 1956, 58 y.o.   MRN: [id number] [name] returns today for a scheduled followup visit.  He is a 58 year old gentleman who was noted to have an abnormal EKG on a preoperative evaluation prior to having bladder surgery. He had a stress test which was abnormal and that led to cardiac catheterization, which revealed severe three-vessel coronary disease.  He underwent coronary bypass grafting x3 with bilateral mammaries on July 30. He was extubated early postoperatively, but had severe wheezing and respiratory distress and had to be reintubated that evening. He was subsequently extubated the following day. He continued to have some issues with wheezing. He was discharged on dulera and Xopenex inhalers.   Also has history of AAA surgery in 2013 [facility]  [place] 5/15 with residual 4.4 cm AAA sac      ROS: Denies fever, malais, weight loss, blurry vision, decreased visual acuity, cough, sputum, SOB, hemoptysis, pleuritic pain, palpitaitons, heartburn, abdominal pain, melena, lower extremity edema, claudication, or rash.  All other systems reviewed and negative  General: Affect appropriate Healthy:  appears stated age 73: normal Neck supple with no adenopathy JVP normal no bruits no thyromegaly Lungs clear with no wheezing and good diaphragmatic motion Heart:  S1/S2 no murmur, no rub, gallop or click PMI normal Abdomen: benighn, BS positve, no tenderness, no AAA no bruit.  No HSM or HJR Distal pulses intact with no bruits No edema Neuro non-focal Skin warm and dry No muscular weakness   Current Outpatient Prescriptions  Medication Sig Dispense Refill  . aspirin EC 325 MG EC tablet Take 1 tablet (325 mg total) by mouth daily.      [name] atorvastatin (LIPITOR) 20 MG tablet Take 1 tablet (20 mg total) by mouth daily at 6 PM.  30 tablet  1  . DULERA 100-5 MCG/ACT AERO       . Glucosamine-Chondroit-Vit C-Mn (GLUCOSAMINE 1500 COMPLEX PO) Take 2 tablets by mouth  daily.      . hydrochlorothiazide (MICROZIDE) 12.5 MG capsule Take 12.5 mg by mouth daily.      [name] levalbuterol (XOPENEX HFA) 45 MCG/ACT inhaler Inhale 1-2 puffs into the lungs every 6 (six) hours as needed for wheezing.  1 Inhaler  12  . lisinopril (PRINIVIL,ZESTRIL) 20 MG tablet Take 1 tablet (20 mg total) by mouth daily.  30 tablet  3  . metoprolol succinate (TOPROL-XL) 25 MG 24 hr tablet Take 1 tablet (25 mg total) by mouth daily.  90 tablet  3  . Multiple Vitamins-Minerals (CENTRUM SILVER ADULT 50+ PO) Take 1 capsule by mouth daily.      . traMADol (ULTRAM) 50 MG tablet Take 1 tablet (50 mg total) by mouth every 6 (six) hours as needed.  40 tablet  0   No current facility-administered medications for this visit.    Allergies  Codeine  Electrocardiogram:  SR rate 53 normal   Assessment and Plan

## 2014-04-17 NOTE — Assessment & Plan Note (Signed)
F/U pulmonary NO active wheezing continue inhalers

## 2014-04-17 NOTE — Assessment & Plan Note (Signed)
Stable with no angina and good activity level.  Continue medical Rx Going to cardiac rehab now

## 2014-04-17 NOTE — Telephone Encounter (Signed)
The pt had a swallowing study ordered by Dr [name] that showed Zenkers diverticulum and he would like the pt seen ASAP.  Sch with Janett Billow for 04/19/14.  Jenny Reichmann will call the pt.

## 2014-04-17 NOTE — Assessment & Plan Note (Signed)
Cholesterol is at goal.  Continue current dose of statin and diet Rx.  No myalgias or side effects.  F/U  LFT's in 6 months. Lab Results  Component Value Date   LDLCALC 66 04/15/2014

## 2014-04-17 NOTE — Assessment & Plan Note (Signed)
Primary issue is high BP when he awakes in am Try to take beta blocker at night when he goes to sleep

## 2014-04-19 ENCOUNTER — Encounter: Payer: Self-pay | Admitting: Gastroenterology

## 2014-04-19 ENCOUNTER — Ambulatory Visit (INDEPENDENT_AMBULATORY_CARE_PROVIDER_SITE_OTHER): Payer: BC Managed Care – PPO | Admitting: Gastroenterology

## 2014-04-19 ENCOUNTER — Encounter (HOSPITAL_COMMUNITY)
Admission: RE | Admit: 2014-04-19 | Discharge: 2014-04-19 | Disposition: A | Discharge status: Home / Self Care | Payer: BC Managed Care – PPO | Source: Ambulatory Visit | Attending: Cardiovascular Disease | Admitting: Cardiovascular Disease

## 2014-04-19 VITALS — BP 112/76 | HR 88 | Ht 68.5 in | Wt 197.1 lb

## 2014-04-19 DIAGNOSIS — K225 Diverticulum of esophagus, acquired: Secondary | ICD-10-CM

## 2014-04-19 DIAGNOSIS — R1310 Dysphagia, unspecified: Secondary | ICD-10-CM | POA: Insufficient documentation

## 2014-04-19 DIAGNOSIS — Z5189 Encounter for other specified aftercare: Secondary | ICD-10-CM | POA: Diagnosis not present

## 2014-04-19 NOTE — Patient Instructions (Signed)
You have been scheduled for a Barium Esophogram at [facility] (1st floor of the hospital) on 04-25-2014 at 1030am. Please arrive 15 minutes prior to your appointment for registration. Make certain not to have anything to eat or drink 6 hours prior to your test. If you need to reschedule for any reason, please contact radiology at [phone] to do so. __________________________________________________________________ A barium swallow is an examination that concentrates on views of the esophagus. This tends to be a double contrast exam (barium and two liquids which, when combined, create a gas to distend the wall of the oesophagus) or single contrast (non-ionic iodine based). The study is usually tailored to your symptoms so a good history is essential. Attention is paid during the study to the form, structure and configuration of the esophagus, looking for functional disorders (such as aspiration, dysphagia, achalasia, motility and reflux) EXAMINATION You may be asked to change into a gown, depending on the type of swallow being performed. A radiologist and radiographer will perform the procedure. The radiologist will advise you of the type of contrast selected for your procedure and direct you during the exam. You will be asked to stand, sit or lie in several different positions and to hold a small amount of fluid in your mouth before being asked to swallow while the imaging is performed .In some instances you may be asked to swallow barium coated marshmallows to assess the motility of a solid food bolus. The exam can be recorded as a digital or video fluoroscopy procedure. POST PROCEDURE It will take 1-2 days for the barium to pass through your system. To facilitate this, it is important, unless otherwise directed, to increase your fluids for the next 24-48hrs and to resume your normal diet.  This test typically takes about 30 minutes to  perform. __________________________________________________________________________________

## 2014-04-19 NOTE — Progress Notes (Signed)
     04/19/2014 Carl Lee [id number] 11/16/1955   History of Present Illness:  This is a 58 year old male who has a past medical history of bladder cancer, hypertension, AAA with endovascular repair in September 2013, and coronary artery disease with recent CABG on 02/07/2014. He just started cardiac rehabilitation this week. He states that since having his bypass surgery he has been having some problems swallowing. He says prior to the surgery he never had any issues, but now pills seem to be getting hung up in his throat/upper esophagus. Some food will get hung up there as well. Does not feel like food actually getting stuck in his lower in his chest/esophagus. He says he has been having problems with coughing up a lot more phlegm and has also been having some reflux more frequently compared to prior to having surgery. Also coughs up pills and small pieces of food at times.  He had a modified barium swallow study by speech pathology, which revealed mild cervical esophageal phase dysphagia and suspected primary esophageal dysphagia. They also noted a small Zenker's diverticulum.  He was referred here for further evaluation.  He is known to Dr. Ardis Hughs for previous colonoscopies. His last colonoscopy was in December 2013.  At that time he had one small polyp that was removed and was a hyperplastic polyp, however, it was recommended that he have repeat colonoscopy in 5 years due to previous tubular adenomas. He also has diverticulosis in the left colon.  Current Medications, Allergies, Past Medical History, Past Surgical History, Family History and Social History were reviewed in [employer] record.   Physical Exam: BP 112/76  Pulse 88  Ht 5' 8.5" (1.74 m)  Wt 197 lb 2 oz (89.415 kg)  BMI 29.53 kg/m2 General: Well developed white male in no acute distress Head: Normocephalic and atraumatic Eyes:  Sclerae anicteric, conjunctiva pink  Ears: Normal auditory acuity Lungs:  Clear throughout to auscultation Heart: Regular rate and rhythm Abdomen: Soft, non-distended.  Normal bowel sounds.  Non-tender. Musculoskeletal: Symmetrical with no gross deformities  Extremities: No edema  Neurological: Alert oriented x 4, grossly non-focal Psychological:  Alert and cooperative. Normal mood and affect  Assessment and Recommendations: -Dysphagia:  Suspect that his symptoms are mostly due to his Zenker's diverticulum.  He is just two months out from his CABG and just started cardiac rehab.  We will start by ordering a barium esophagram with tablet to check for stricture, etc that could be amenable to EGD before scheduling a procedure.  If only has a Zenker's then he may need eventual referral for repair.

## 2014-04-22 ENCOUNTER — Encounter (HOSPITAL_COMMUNITY)
Admission: RE | Admit: 2014-04-22 | Discharge: 2014-04-22 | Disposition: A | Discharge status: Home / Self Care | Payer: BC Managed Care – PPO | Source: Ambulatory Visit | Attending: Cardiovascular Disease | Admitting: Cardiovascular Disease

## 2014-04-22 ENCOUNTER — Other Ambulatory Visit: Payer: Self-pay | Admitting: Thoracic Surgery (Cardiothoracic Vascular Surgery)

## 2014-04-22 DIAGNOSIS — Z5189 Encounter for other specified aftercare: Secondary | ICD-10-CM | POA: Diagnosis not present

## 2014-04-22 NOTE — Progress Notes (Signed)
I agree with the note, plan above 

## 2014-04-23 ENCOUNTER — Other Ambulatory Visit: Payer: Self-pay | Admitting: *Deleted

## 2014-04-23 MED ORDER — ATORVASTATIN CALCIUM 20 MG PO TABS: 20.0000 mg | ORAL_TABLET | Freq: Every day | ORAL | Status: DC

## 2014-04-24 ENCOUNTER — Encounter (HOSPITAL_COMMUNITY)
Admission: RE | Admit: 2014-04-24 | Discharge: 2014-04-24 | Disposition: A | Discharge status: Home / Self Care | Payer: BC Managed Care – PPO | Source: Ambulatory Visit | Attending: Cardiovascular Disease | Admitting: Cardiovascular Disease

## 2014-04-24 DIAGNOSIS — Z5189 Encounter for other specified aftercare: Secondary | ICD-10-CM | POA: Diagnosis not present

## 2014-04-24 NOTE — Progress Notes (Signed)
I have reviewed home exercise with Nicole Kindred. The patient was advised to walk 2-4 days per week outside of CRP II for 30-45 minutes continuously.  Pt will also complete one additional day of hand weights outside of CRP II.  Progression of exercise prescription was discussed.  Reviewed THR, pulse, RPE, sign and symptoms and when to call 911 or MD.  Pt voiced understanding. 0815  Archie Endo, MS, ACSM RCEP 04/24/2014 2:22 PM

## 2014-04-25 ENCOUNTER — Ambulatory Visit (HOSPITAL_COMMUNITY)
Admission: RE | Admit: 2014-04-25 | Discharge: 2014-04-25 | Disposition: A | Discharge status: Home / Self Care | Payer: BC Managed Care – PPO | Source: Ambulatory Visit | Attending: Gastroenterology | Admitting: Gastroenterology

## 2014-04-25 DIAGNOSIS — R1310 Dysphagia, unspecified: Secondary | ICD-10-CM

## 2014-04-26 ENCOUNTER — Other Ambulatory Visit: Payer: Self-pay | Admitting: *Deleted

## 2014-04-26 ENCOUNTER — Encounter (HOSPITAL_COMMUNITY): Admission: RE | Admit: 2014-04-26 | Payer: BC Managed Care – PPO | Source: Ambulatory Visit

## 2014-04-26 ENCOUNTER — Telehealth (HOSPITAL_COMMUNITY): Payer: Self-pay | Admitting: Cardiac Rehabilitation

## 2014-04-26 DIAGNOSIS — K225 Diverticulum of esophagus, acquired: Secondary | ICD-10-CM

## 2014-04-26 NOTE — Telephone Encounter (Signed)
Message copied by Lowell Guitar on Fri Apr 26, 2014  7:49 AM ------      Message from: Serafina Mitchell      Created: Mon Apr 22, 2014  9:14 AM      Regarding: RE: Cardiac Rehab       The patient's aneurysm has been repaired.  There are no blood pressure restrictions from my perspective.  There are no activity restrictions.  He can use weights without weight restriction      ----- Message -----         From: Lowell Guitar, RN         Sent: 04/16/2014   9:52 AM           To: Serafina Mitchell, MD      Subject: Cardiac Rehab                                            Dear Dr. [name],            Pt is scheduled to begin cardiac rehab and is very anxious to get started.  I know you have already cleared him for participation, however,  Dr. [name] requested we consult with you regarding blood pressure parameters and specific equipment limitations for pt with stable 4.5cm AAA.              1) Please indicate resting and exercise BP parameters.              2)Are there any specific exercise activity or equipment restrictions?              3)What are the maximum treadmilll speed and incline parameters?              4)Is it appropriate for him to use hand weights and what is maximum pound allowed?              Thank you,      Andi Hence, RN, BSN      Cardiac Pulmonary Rehab             ------

## 2014-04-29 ENCOUNTER — Encounter (HOSPITAL_COMMUNITY)
Admission: RE | Admit: 2014-04-29 | Discharge: 2014-04-29 | Disposition: A | Discharge status: Home / Self Care | Payer: BC Managed Care – PPO | Source: Ambulatory Visit | Attending: Cardiovascular Disease | Admitting: Cardiovascular Disease

## 2014-04-29 ENCOUNTER — Encounter: Payer: Self-pay | Admitting: Cardiovascular Disease

## 2014-04-29 DIAGNOSIS — Z5189 Encounter for other specified aftercare: Secondary | ICD-10-CM | POA: Diagnosis not present

## 2014-04-30 ENCOUNTER — Telehealth (HOSPITAL_COMMUNITY): Payer: Self-pay | Admitting: Cardiac Rehabilitation

## 2014-04-30 NOTE — Telephone Encounter (Signed)
Message copied by Lowell Guitar on Tue Apr 30, 2014  2:01 PM ------      Message from: Serafina Mitchell      Created: Tue Apr 09, 2014  9:17 PM      Regarding: RE: Cardiac Rehab        Clear.  No restrictions      ----- Message -----         From: Lowell Guitar, RN         Sent: 04/09/2014   3:36 PM           To: Serafina Mitchell, MD      Subject: Cardiac Rehab                                            Dear Dr. [name],            Pt is scheduled to begin cardiac rehab following his CABG 02/07/14.  He has a stable 4.5cm AAA s/p endograft repair 03/2012.        Is he cleared to exercise at cardiac rehab?        Are there any activity restrictions?        What are his BP parameters?              Thank you,      Andi Hence, RN, BSN      Cardiac Pulmonary Rehab             ------

## 2014-05-01 ENCOUNTER — Encounter (HOSPITAL_COMMUNITY)
Admission: RE | Admit: 2014-05-01 | Discharge: 2014-05-01 | Disposition: A | Discharge status: Home / Self Care | Payer: BC Managed Care – PPO | Source: Ambulatory Visit | Attending: Cardiovascular Disease | Admitting: Cardiovascular Disease

## 2014-05-01 DIAGNOSIS — Z5189 Encounter for other specified aftercare: Secondary | ICD-10-CM | POA: Diagnosis not present

## 2014-05-01 NOTE — Progress Notes (Signed)
Pt arrived at cardiac rehab c/o "not feeling well" today.  Pt reports he did not rest well due to surgical incision pain.  This discomfort is relieved with activity and worse at rest. Pt reports he took ibuprofen yesterday for the discomfort.  Pt able to exercise without pain however did report mild episode of lightheadedness.  Pt did not eat breakfast this am. Pt BP-150/98 with exercise down to 120/80 with rest.  Pt given peanut butter cracker and gatorade with relief of lightheadedness.  Recheck BP-120/80 sitting, 130/84 standing.  Pt able to continue exercising without difficulty.  Pt advised to contact surgeon if surgical pain persists or worsens.  Understanding verbalized.

## 2014-05-03 ENCOUNTER — Encounter (HOSPITAL_COMMUNITY)
Admission: RE | Admit: 2014-05-03 | Discharge: 2014-05-03 | Disposition: A | Discharge status: Home / Self Care | Payer: BC Managed Care – PPO | Source: Ambulatory Visit | Attending: Cardiovascular Disease | Admitting: Cardiovascular Disease

## 2014-05-03 DIAGNOSIS — Z5189 Encounter for other specified aftercare: Secondary | ICD-10-CM | POA: Diagnosis not present

## 2014-05-06 ENCOUNTER — Institutional Professional Consult (permissible substitution) (INDEPENDENT_AMBULATORY_CARE_PROVIDER_SITE_OTHER): Payer: Self-pay | Admitting: Cardiothoracic Surgery

## 2014-05-06 ENCOUNTER — Encounter (HOSPITAL_COMMUNITY)
Admission: RE | Admit: 2014-05-06 | Discharge: 2014-05-06 | Disposition: A | Discharge status: Home / Self Care | Payer: BC Managed Care – PPO | Source: Ambulatory Visit | Attending: Cardiovascular Disease | Admitting: Cardiovascular Disease

## 2014-05-06 ENCOUNTER — Encounter: Payer: Self-pay | Admitting: Cardiothoracic Surgery

## 2014-05-06 VITALS — BP 116/81 | HR 77 | Ht 68.0 in | Wt 197.0 lb

## 2014-05-06 DIAGNOSIS — Z951 Presence of aortocoronary bypass graft: Secondary | ICD-10-CM

## 2014-05-06 DIAGNOSIS — Z5189 Encounter for other specified aftercare: Secondary | ICD-10-CM | POA: Diagnosis not present

## 2014-05-06 NOTE — Progress Notes (Signed)
[address]       [place] [address]             [phone]                    Aaron J Ruby [place] Medical Record #[id number] Date of Birth: 04/30/1956  Referring: [name], MD Primary Care: [name], MD  Chief Complaint:    Chief Complaint  Patient presents with  . NEW THORACIC    ZENKER DIVERTICULUM    History of Present Illness:    Carl Lee 58 y.o. male is seen in the office  today for "Zenker's diverticulum". The patient gives a history of having heart surgery July 30 approximate 3 months ago. At the time of surgery had a transesophageal echo probe placed,  postoperative course was complicated by early extubation requiring reintubation the first postop night. At the time of discharge she was actively being treated for COPD, postop wheezing. He notes initially after surgery he had trouble swallowing pills, however over the past 3 months this is improved. Patient has not been smoking for 3 months. He had a swallow evaluation and then a upper GI, suggesting upper esophageal sphincter dysfunction and Zenker's diverticulum-see x-ray reports He has had no manometrics or endoscopy performed.  The patient notes that his swallowing ability has improved over the past 3 months, he's taking pills without difficulty. He does have a slight cough with some phlegm production. Denies any aspiration symptoms, especially of any retained food.      Current Activity/ Functional Status:  Patient is independent with mobility/ambulation, transfers, ADL's, IADL's.   Zubrod Score: At the time of surgery this patient's most appropriate activity status/level should be described as: [x]     0    Normal activity, no symptoms []     1    Restricted in physical strenuous activity but ambulatory, able to do out light work []     2    Ambulatory and capable of self care, unable to do work activities, up and about               >50 % of waking hours                               []     3    Only limited self care, in bed greater than 50% of waking hours []     4    Completely disabled, no self care, confined to bed or chair []     5    Moribund   Past Medical History  Diagnosis Date  . Bladder cancer 08/16/2008    [facility] [place] [facility] [place])  . Hypertension   . AAA (abdominal aortic aneurysm) 03/25/12    s/p endovascular repair  . Hx of migraines   . Contact dermatitis     atypical [name])  . Childhood asthma   . Environmental allergies     improved as ages  . Diverticulosis 2013    severe by CT and colonoscopy  . Adenomatous polyp 06/2009  . Lumbosacral radiculopathy at S1 03/2012    left, with spinal stenosis (MRI 04/2013) improved with TF ESI L5/S1 and S1/2 ([facility] [place])  . Spinal stenosis     S1  . Coronary artery disease   . Zenker diverticulum     Past Surgical History  Procedure Laterality Date  . Tonsillectomy and  adenoidectomy  1973  . Vasectomy    . Bladder cancer      ([place]) x 2  . Abdominal aortic aneurysm repair  03/25/12  . Colonoscopy  06/2012    hyperplastic polyp, diverticulosis ([facility]) rec rpt 5 yrs  . Cystoscopy  08/16/08    Bladder Cancer  . Esi  04/2013, 06/2013    L L5S1, S12 transforaminal ESI (Dr.  [name])  . Abi  05/2013    WNL, L TBI low at 0.66  . Coronary artery bypass graft N/A 02/07/2014    Procedure: CORONARY ARTERY BYPASS GRAFTING (CABG);  Surgeon: [name], MD;  Location: [facility];  Service: Open Heart Surgery;  Laterality: N/A;  CABG X 3, BILATERAL LIMA, EVH  . Intraoperative transesophageal echocardiogram N/A 02/07/2014    Procedure: INTRAOPERATIVE TRANSESOPHAGEAL ECHOCARDIOGRAM;  Surgeon: [name], MD;  Location: [facility];  Service: Open Heart Surgery;  Laterality: N/A;    Family History  Problem Relation Age of Onset  . Diabetes Mother   . Arrhythmia Mother     pacemaker  . Cancer Mother     Bladder  . COPD Mother   . Thyroid disease Mother   .  Hyperlipidemia Mother   . Hypertension Mother   . Diabetes Father   . CAD Father 1    CHF, MI  . Dementia Father   . Diabetes Sister   . Colon cancer Neg Hx   . Diabetes Brother   . Hypertension Brother   . Heart attack Maternal Grandmother   . Heart attack Father     History   Social History  . Marital Status: Married    Spouse Name: N/A    Number of Children: 2  . Years of Education: N/A   Occupational History  . Self Employed    Social History Main Topics  . Smoking status: Former Smoker -- 1.00 packs/day for 40 years    Types: Cigarettes    Quit date: 02/05/2014  . Smokeless tobacco: Never Used  . Alcohol Use: Yes     Comment: Rare- states 1 oz. per week  . Drug Use: No  . Sexual Activity: Not on file   Other Topics Concern  . Not on file   Social History Narrative   Caffeine: 1 cup coffee/day   Lives with wife, 1 dog   grown children   Occupation: Therapist, sports - self employed   Edu: 2 yrs college   Activity: fishing, walking occasionally   Diet: good water, fruits/vegetables daily    History  Smoking status  . Former Smoker -- 1.00 packs/day for 40 years  . Types: Cigarettes  . Quit date: 02/05/2014  Smokeless tobacco  . Never Used    History  Alcohol Use  . Yes    Comment: Rare- states 1 oz. per week     Allergies  Allergen Reactions  . Codeine Other (See Comments)    Nightmares    Current Outpatient Prescriptions  Medication Sig Dispense Refill  . aspirin EC 325 MG EC tablet Take 1 tablet (325 mg total) by mouth daily.      [name] atorvastatin (LIPITOR) 20 MG tablet Take 1 tablet (20 mg total) by mouth daily at 6 PM.  90 tablet  1  . DULERA 100-5 MCG/ACT AERO       . Glucosamine-Chondroit-Vit C-Mn (GLUCOSAMINE 1500 COMPLEX PO) Take 2 tablets by mouth daily.      . hydrochlorothiazide (MICROZIDE) 12.5 MG capsule Take 12.5 mg  by mouth daily.      [name] levalbuterol (XOPENEX HFA) 45 MCG/ACT inhaler Inhale 1-2 puffs into the lungs every  6 (six) hours as needed for wheezing.  1 Inhaler  12  . lisinopril (PRINIVIL,ZESTRIL) 20 MG tablet Take 1 tablet (20 mg total) by mouth daily.  30 tablet  3  . metoprolol succinate (TOPROL-XL) 25 MG 24 hr tablet Take 1 tablet (25 mg total) by mouth daily.  90 tablet  3  . Multiple Vitamins-Minerals (CENTRUM SILVER ADULT 50+ PO) Take 1 capsule by mouth daily.      . traMADol (ULTRAM) 50 MG tablet Take 1 tablet (50 mg total) by mouth every 6 (six) hours as needed.  40 tablet  0   No current facility-administered medications for this visit.     Review of Systems:     Cardiac Review of Systems: Y or N  Chest Pain [  n  ]  Resting SOB [n   ] Exertional SOB  [n  ]  Orthopnea [ n ]   Pedal Edema [n   ]    Palpitations Florencio.Farrier ] Syncope  [ n ]   Presyncope [ n  ]  General Review of Systems: [Y] = yes [  ]=no Constitional: recent weight change [ n ];  Wt loss over the last 3 months [   ] anorexia [  ]; fatigue [  ]; nausea [  ]; night sweats [  ]; fever [  ]; or chills [  ];          Dental: poor dentition[  ]; Last Dentist visit:   Eye : blurred vision [  ]; diplopia [   ]; vision changes [  ];  Amaurosis fugax[  ]; Resp: cough [  ];  wheezing[y  ];  hemoptysis[  ]; shortness of breath[  ]; paroxysmal nocturnal dyspnea[  ]; dyspnea on exertion[  ]; or orthopnea[  ];  GI:  gallstones[n  ], vomiting[ n ];  dysphagia[ y ]; melena[  ];  hematochezia [  ]; heartburn[  ];   Hx of  Colonoscopy[ y ]; GU: kidney stones [  ]; hematuria[  ];   dysuria [  ];  nocturia[  ];  history of     obstruction [  ]; urinary frequency [  ]             Skin: rash, swelling[  ];, hair loss[  ];  peripheral edema[  ];  or itching[  ]; Musculosketetal: myalgias[  ];  joint swelling[  ];  joint erythema[  ];  joint pain[  ];  back pain[  ];  Heme/Lymph: bruising[  ];  bleeding[  ];  anemia[  ];  Neuro: TIA[  ];  headaches[  ];  stroke[  ];  vertigo[  ];  seizures[  ];   paresthesias[  ];  difficulty walking[  ];  Psych:depression[   ]; anxiety[  ];  Endocrine: diabetes[  ];  thyroid dysfunction[  ];  Immunizations: Flu up to date [ Y ]; Pneumococcal up to date Jazmín.Cullens  ];  Other:  Physical Exam: BP 116/81  Pulse 77  Ht 5\' 8"  (1.727 m)  Wt 197 lb (89.359 kg)  BMI 29.96 kg/m2  SpO2 96%  PHYSICAL EXAMINATION:  General appearance: alert, cooperative and appears older than stated age Neurologic: intact Heart: regular rate and rhythm, S1, S2 normal, no murmur, click, rub or gallop Lungs: clear to auscultation bilaterally Abdomen: soft, non-tender;  bowel sounds normal; no masses,  no organomegaly Extremities: extremities normal, atraumatic, no cyanosis or edema and Homans sign is negative, no sign of DVT Wound: Sternal incision is well-healed   Diagnostic Studies & Laboratory data:     Recent Radiology Findings:  Dg Chest 2 View  04/16/2014   CLINICAL DATA:  Coronary artery disease. COPD and hypertension. Former smoker.  EXAM: CHEST  2 VIEW  COMPARISON:  03/19/2014  FINDINGS: Sternotomy wires are unchanged. Lungs are adequately inflated without consolidation or effusion. There is flattening of the hemidiaphragms on the lateral film. Cardiomediastinal silhouette and remainder of the exam is unchanged.  IMPRESSION: No active cardiopulmonary disease.   Electronically Signed   By: Marin Olp M.D.   On: 04/16/2014 10:20   Dg Esophagus  04/25/2014   CLINICAL DATA:  Difficulty swallowing pills and food. Choking sensation mild eating. Reflux. Cough.  EXAM: ESOPHOGRAM / BARIUM SWALLOW / BARIUM TABLET STUDY  TECHNIQUE: Combined double contrast and single contrast examination performed using effervescent crystals, thick barium liquid, and thin barium liquid. The patient was observed with fluoroscopy swallowing a 60mm barium sulphate tablet.  FLUOROSCOPY TIME:  1 min, 26 seconds  COMPARISON:  None.  FINDINGS: Pharyngeal phase of swallowing reveals a small Zenker' s diverticulum as on image 9 series 2.  Occasional tertiary  contractions noted in the esophagus. Primary peristaltic waves in the esophagus uninterrupted on 3 out of 4 swallows, within normal limits.  No esophageal ulceration or significant constriction identified. A 13 mm barium tablet passed briskly to the stomach, but the 80 listed the patient has feelings of globus sensation for up to 30 seconds afterwards.  IMPRESSION: 1. Small Zenker's diverticulum. 2. The patient experienced globus sensation after swallowing the barium pill, even though the barium pill passed briskly into the stomach. No esophageal stricture identified.   Electronically Signed   By: Sherryl Barters M.D.   On: 04/25/2014 14:13        Recent Lab Findings: Lab Results  Component Value Date   WBC 15.5* 02/12/2014   HGB 12.3* 02/12/2014   HCT 36.7* 02/12/2014   PLT 330 02/12/2014   GLUCOSE 56* 03/06/2014   CHOL 140 04/15/2014   TRIG 168.0* 04/15/2014   HDL 40.70 04/15/2014   LDLDIRECT 105 01/05/2012   LDLCALC 66 04/15/2014   ALT 10 04/15/2014   AST 18 04/15/2014   NA 139 03/06/2014   K 4.0 03/06/2014   CL 102 03/06/2014   CREATININE 0.7 03/06/2014   BUN 20 03/06/2014   CO2 27 03/06/2014   TSH 1.591 01/04/2012   INR 1.23 02/07/2014   HGBA1C 5.8* 02/06/2014      Assessment / Plan:  I reviewed the films and seen the patient and discussed his symptoms. Overall he notes symptoms of difficulty swallowing or improving over the past month. The patient has not had any functional esophageal manometric studies nor endoscopy. Before entertaining any surgical intervention this would be necessary. The radiographic findings do appear consistent with a Zenker's, and alternatives to explanation could be esophageal injury associated with the patient's esophageal ultrasound and multiple intubations. With the patient's symptoms improving at this point I discussed with him returning in 3-4 weeks to reevaluate his symptoms and then consider referral for manometric studies and endoscopy. Patient is agreeable with this.       I spent 40 minutes counseling the patient face to face. The total time spent in the appointment was 55 minutes.  Grace Isaac MD  [address] [place] [address] Office [phone]   Beeper [phone]  05/06/2014 4:03 PM

## 2014-05-08 ENCOUNTER — Encounter (HOSPITAL_COMMUNITY)
Admission: RE | Admit: 2014-05-08 | Discharge: 2014-05-08 | Disposition: A | Discharge status: Home / Self Care | Payer: BC Managed Care – PPO | Source: Ambulatory Visit | Attending: Cardiovascular Disease | Admitting: Cardiovascular Disease

## 2014-05-08 DIAGNOSIS — Z5189 Encounter for other specified aftercare: Secondary | ICD-10-CM | POA: Diagnosis not present

## 2014-05-10 ENCOUNTER — Encounter (HOSPITAL_COMMUNITY)
Admission: RE | Admit: 2014-05-10 | Discharge: 2014-05-10 | Disposition: A | Discharge status: Home / Self Care | Payer: BC Managed Care – PPO | Source: Ambulatory Visit | Attending: Cardiovascular Disease | Admitting: Cardiovascular Disease

## 2014-05-10 ENCOUNTER — Encounter: Payer: Self-pay | Admitting: Family Medicine

## 2014-05-10 DIAGNOSIS — Z5189 Encounter for other specified aftercare: Secondary | ICD-10-CM | POA: Diagnosis not present

## 2014-05-10 NOTE — Progress Notes (Signed)
Carl Lee 58 y.o. male Nutrition Note Spoke with pt. Nutrition Survey reviewed with pt. Pt is following Step 1 of the Therapeutic Lifestyle Changes diet. Pt c/o decreased appetite/taste sensation. Pt states his appetite decreased 2 years ago when he had his aneurysm surgery and "hasn't been the same since." Pt eats breakfast and lunch and eats "some Lance crackers for dinner." Pt wt is down 10 lb from reported pre-surgery wt of 205 lb. Pt wt appears stable over the past 2 months according to EMR. Pt wants to maintain his wt. Pt is pre-diabetic according to his most recent A1c. Pt states he is aware of pre-diabetes dx and monitors his A1c closely with his PCP. Pt expressed understanding of the information reviewed. Pt aware of nutrition education classes offered. Vitals - 1 value per visit 05/06/2014 04/19/2014 04/17/2014 04/16/2014  Weight (lb) 197 197.13 197.12 195   Vitals - 1 value per visit 04/11/2014 03/26/2014 03/26/2014 03/19/2014 03/12/2014  Weight (lb) 195.55 193 196 193 193.5   Vitals - 1 value per visit 03/06/2014 02/25/2014 02/19/2014 02/13/2014 02/07/2014  Weight (lb) 196 188  188.05    Vitals - 1 value per visit 02/07/2014 02/06/2014 02/05/2014 02/01/2014  Weight (lb)  194.7 195 195   Vitals - 1 value per visit 01/28/2014 01/21/2014 01/15/2014 11/12/2013 08/29/2013  Weight (lb) 194 197.8 196 198    Nutrition Diagnosis   Food-and nutrition-related knowledge deficit related to lack of exposure to information as related to diagnosis of: ? CVD ? Pre-DM (A1c 5.8) ?  Nutrition Intervention   Benefits of adopting Therapeutic Lifestyle Changes discussed when Medficts reviewed.   Pt to attend the Portion Distortion class   Pt to attend the  ? Nutrition I class                     ? Nutrition II class   Pt given handouts for: ? Nutrition I class ? Nutrition II class   Continue client-centered nutrition education by RD, as part of interdisciplinary care.  Goal(s)   Pt to identify and limit food sources of  saturated fat, trans fat, and cholesterol  Monitor and Evaluate progress toward nutrition goal with team.   Derek Mound, M.Ed, RD, LDN, CDE 05/10/2014 9:48 AM

## 2014-05-13 ENCOUNTER — Encounter (HOSPITAL_COMMUNITY)
Admission: RE | Admit: 2014-05-13 | Discharge: 2014-05-13 | Disposition: A | Discharge status: Home / Self Care | Payer: BC Managed Care – PPO | Source: Ambulatory Visit | Attending: Cardiovascular Disease | Admitting: Cardiovascular Disease

## 2014-05-13 DIAGNOSIS — Z7982 Long term (current) use of aspirin: Secondary | ICD-10-CM | POA: Insufficient documentation

## 2014-05-13 DIAGNOSIS — J449 Chronic obstructive pulmonary disease, unspecified: Secondary | ICD-10-CM | POA: Insufficient documentation

## 2014-05-13 DIAGNOSIS — J9610 Chronic respiratory failure, unspecified whether with hypoxia or hypercapnia: Secondary | ICD-10-CM | POA: Insufficient documentation

## 2014-05-13 DIAGNOSIS — Z79899 Other long term (current) drug therapy: Secondary | ICD-10-CM | POA: Insufficient documentation

## 2014-05-13 DIAGNOSIS — Z885 Allergy status to narcotic agent status: Secondary | ICD-10-CM | POA: Diagnosis not present

## 2014-05-13 DIAGNOSIS — I200 Unstable angina: Secondary | ICD-10-CM | POA: Insufficient documentation

## 2014-05-13 DIAGNOSIS — I2510 Atherosclerotic heart disease of native coronary artery without angina pectoris: Secondary | ICD-10-CM | POA: Diagnosis not present

## 2014-05-13 DIAGNOSIS — C679 Malignant neoplasm of bladder, unspecified: Secondary | ICD-10-CM | POA: Diagnosis not present

## 2014-05-13 DIAGNOSIS — Z8249 Family history of ischemic heart disease and other diseases of the circulatory system: Secondary | ICD-10-CM | POA: Diagnosis not present

## 2014-05-13 DIAGNOSIS — F17200 Nicotine dependence, unspecified, uncomplicated: Secondary | ICD-10-CM | POA: Insufficient documentation

## 2014-05-13 DIAGNOSIS — F419 Anxiety disorder, unspecified: Secondary | ICD-10-CM | POA: Insufficient documentation

## 2014-05-13 DIAGNOSIS — E785 Hyperlipidemia, unspecified: Secondary | ICD-10-CM | POA: Diagnosis not present

## 2014-05-13 DIAGNOSIS — I10 Essential (primary) hypertension: Secondary | ICD-10-CM | POA: Diagnosis not present

## 2014-05-13 DIAGNOSIS — I739 Peripheral vascular disease, unspecified: Secondary | ICD-10-CM | POA: Insufficient documentation

## 2014-05-13 DIAGNOSIS — I2582 Chronic total occlusion of coronary artery: Secondary | ICD-10-CM | POA: Diagnosis not present

## 2014-05-13 DIAGNOSIS — Z5189 Encounter for other specified aftercare: Secondary | ICD-10-CM | POA: Diagnosis not present

## 2014-05-13 DIAGNOSIS — Z951 Presence of aortocoronary bypass graft: Secondary | ICD-10-CM | POA: Diagnosis not present

## 2014-05-13 DIAGNOSIS — E780 Pure hypercholesterolemia: Secondary | ICD-10-CM | POA: Diagnosis not present

## 2014-05-14 ENCOUNTER — Telehealth: Payer: Self-pay | Admitting: *Deleted

## 2014-05-14 NOTE — Telephone Encounter (Addendum)
PER  DR [name] PT  NEEDS TO INCREASE  LISINOPRIL TO  40 MG    EVERY DAY  PT  AWARE  WILL TRY  INCREASE   PT  TO CALL NEXT WEEK  FOR UPDATE  IF TOLERATES IN CREASE DOSE  WILL NEED TO CALL  IN  NEW  SCRIPT .Carl Lee

## 2014-05-15 ENCOUNTER — Encounter (HOSPITAL_COMMUNITY)
Admission: RE | Admit: 2014-05-15 | Discharge: 2014-05-15 | Disposition: A | Discharge status: Home / Self Care | Payer: BC Managed Care – PPO | Source: Ambulatory Visit | Attending: Cardiovascular Disease | Admitting: Cardiovascular Disease

## 2014-05-15 DIAGNOSIS — Z5189 Encounter for other specified aftercare: Secondary | ICD-10-CM | POA: Diagnosis not present

## 2014-05-17 ENCOUNTER — Encounter (HOSPITAL_COMMUNITY)
Admission: RE | Admit: 2014-05-17 | Discharge: 2014-05-17 | Disposition: A | Discharge status: Home / Self Care | Payer: BC Managed Care – PPO | Source: Ambulatory Visit | Attending: Cardiovascular Disease | Admitting: Cardiovascular Disease

## 2014-05-17 DIAGNOSIS — Z5189 Encounter for other specified aftercare: Secondary | ICD-10-CM | POA: Diagnosis not present

## 2014-05-20 ENCOUNTER — Encounter: Payer: Self-pay | Admitting: Cardiovascular Disease

## 2014-05-20 ENCOUNTER — Encounter (HOSPITAL_COMMUNITY)
Admission: RE | Admit: 2014-05-20 | Discharge: 2014-05-20 | Disposition: A | Discharge status: Home / Self Care | Payer: BC Managed Care – PPO | Source: Ambulatory Visit | Attending: Cardiovascular Disease | Admitting: Cardiovascular Disease

## 2014-05-20 DIAGNOSIS — Z5189 Encounter for other specified aftercare: Secondary | ICD-10-CM | POA: Diagnosis not present

## 2014-05-22 ENCOUNTER — Encounter (HOSPITAL_COMMUNITY)
Admission: RE | Admit: 2014-05-22 | Discharge: 2014-05-22 | Disposition: A | Discharge status: Home / Self Care | Payer: BC Managed Care – PPO | Source: Ambulatory Visit | Attending: Cardiovascular Disease | Admitting: Cardiovascular Disease

## 2014-05-22 DIAGNOSIS — Z5189 Encounter for other specified aftercare: Secondary | ICD-10-CM | POA: Diagnosis not present

## 2014-05-24 ENCOUNTER — Encounter (HOSPITAL_COMMUNITY)
Admission: RE | Admit: 2014-05-24 | Discharge: 2014-05-24 | Disposition: A | Discharge status: Home / Self Care | Payer: BC Managed Care – PPO | Source: Ambulatory Visit | Attending: Cardiovascular Disease | Admitting: Cardiovascular Disease

## 2014-05-24 ENCOUNTER — Encounter: Payer: Self-pay | Admitting: Family Medicine

## 2014-05-24 DIAGNOSIS — Z5189 Encounter for other specified aftercare: Secondary | ICD-10-CM | POA: Diagnosis not present

## 2014-05-27 ENCOUNTER — Telehealth: Payer: Self-pay | Admitting: Cardiovascular Disease

## 2014-05-27 ENCOUNTER — Encounter (HOSPITAL_COMMUNITY)
Admission: RE | Admit: 2014-05-27 | Discharge: 2014-05-27 | Disposition: A | Discharge status: Home / Self Care | Payer: BC Managed Care – PPO | Source: Ambulatory Visit | Attending: Cardiovascular Disease | Admitting: Cardiovascular Disease

## 2014-05-27 DIAGNOSIS — Z5189 Encounter for other specified aftercare: Secondary | ICD-10-CM | POA: Diagnosis not present

## 2014-05-27 NOTE — Telephone Encounter (Signed)
I dont understand we would not have increased ACE if BP was running Low??

## 2014-05-27 NOTE — Telephone Encounter (Signed)
New  Message:  Pt is calling in stating that he needs a refill on his Lisinopril called in to the [facility] on [address]. But he states that his BP is still running low even after Dr. [name] increased his dosage. He voiced concerns. Please call  Thanks

## 2014-05-27 NOTE — Telephone Encounter (Signed)
SPOKE WITH  PT'S  WIFE ,  PT  CONTINUES   TO  HAVE LOW B/P   READINGS  FROM THE LAST  COUPLE OF  DAYS    RANGING 91-97/60-64 SINCE  INCREASING  LISINOPRIL TO   40 MG  EVERY DAY WILL FORWARD TO DR [name] FOR REVIEW .Adonis Housekeeper

## 2014-05-28 ENCOUNTER — Telehealth: Payer: Self-pay | Admitting: Cardiovascular Disease

## 2014-05-28 MED ORDER — LISINOPRIL 20 MG PO TABS: 20.0000 mg | ORAL_TABLET | Freq: Every day | ORAL | Status: DC

## 2014-05-28 NOTE — Telephone Encounter (Signed)
New message           Pt has question about medication

## 2014-05-28 NOTE — Telephone Encounter (Signed)
SE OTHER  PHONE  NOTE .Adonis Housekeeper

## 2014-05-28 NOTE — Telephone Encounter (Signed)
SPOKE  WITH   PT   RE  B/P ,  COMPLAINING  B/P   CONT  TO  RUN LOW    NO ENERGY   INSTRUCTED   PT  TO  GO BACK  TO LISINOPRIL  20 MG  EVERY DAY   AND  TO  CALL  IN 7-10  DAYS   WITH  B/P READINGS  PT  VERBALIZED  UNDERSTANDING WILL FORWARD TO DR  [name] FOR  REVIEW

## 2014-05-29 ENCOUNTER — Encounter (HOSPITAL_COMMUNITY)
Admission: RE | Admit: 2014-05-29 | Discharge: 2014-05-29 | Disposition: A | Discharge status: Home / Self Care | Payer: BC Managed Care – PPO | Source: Ambulatory Visit | Attending: Cardiovascular Disease | Admitting: Cardiovascular Disease

## 2014-05-29 DIAGNOSIS — Z5189 Encounter for other specified aftercare: Secondary | ICD-10-CM | POA: Diagnosis not present

## 2014-05-29 LAB — GLUCOSE, CAPILLARY: Glucose-Capillary: 88 mg/dL (ref 70–99)

## 2014-05-29 NOTE — Progress Notes (Signed)
Pt  C/o dizziness and fatigue at cardiac rehab today.  Pt felt dizzy after walking on treadmill.  Pt reports this is same type dizziness that he has been feeling at home.  Pt symptoms relieved with sitting and drinking gatorade.  Pt BP-130/80. No arrhythmias noted, normal sinus rhythm.  Pt states he decreased lisinopril to 20mg  daily at Dr. Kyla Balzarine recommendation in response to hypotension, dizziness and fatigue. However pt is concerned because he needs to take naps each morning in addition to going to bed between 8-9 pm nightly.  Pt has kept BP log at home, pt encouraged to journal his symptoms on this log with the blood pressure readings.  Pt has appt tomorrow with Dr. Servando Snare, pt instructed to discuss these symptoms at that visit.  Appt also made with Richardson Dopp, PA 06/10/14 @12noon , Dr. Kyla Balzarine office to discuss.  In addition, pt has upcoming physical with PCP 06/21/14.  Will continue to monitor.

## 2014-05-30 ENCOUNTER — Encounter: Payer: Self-pay | Admitting: Cardiothoracic Surgery

## 2014-05-30 ENCOUNTER — Ambulatory Visit (INDEPENDENT_AMBULATORY_CARE_PROVIDER_SITE_OTHER): Payer: BC Managed Care – PPO | Admitting: Cardiothoracic Surgery

## 2014-05-30 VITALS — BP 122/79 | HR 63 | Resp 16 | Ht 67.0 in | Wt 197.0 lb

## 2014-05-30 DIAGNOSIS — R198 Other specified symptoms and signs involving the digestive system and abdomen: Secondary | ICD-10-CM

## 2014-05-30 DIAGNOSIS — Z951 Presence of aortocoronary bypass graft: Secondary | ICD-10-CM

## 2014-05-30 NOTE — Progress Notes (Signed)
[address]       [place] [address]             [phone]                    Carl Lee Medical Record #[id number] Date of Birth: 09/15/1955  Referring: [name], MD Primary Care: [name], MD  Chief Complaint:    Cough and sore throat- Zenkers   History of Present Illness:    Carl Lee 58 y.o. male is seen in the office  today for "Zenker's diverticulum". The patient gives a history of having heart surgery February 07 2014 by Dr [name]. At the time of surgery had a transesophageal echo probe placed,  postoperative course was complicated by early extubation requiring reintubation the first postop night. At the time of discharge she was actively being treated for COPD, postop wheezing. He notes initially after surgery he had trouble swallowing pills, however over the past 3 months this is improved. Patient has not been smoking for 3 months. He had a swallow evaluation and then a upper GI, suggesting upper esophageal sphincter dysfunction and Zenker's diverticulum-see x-ray reports He has had no manometrics or endoscopy performed.  The patient notes that his swallowing ability has improved over the past 3 months, he's taking pills without difficulty. Patient notes his bp has been up and down, Cardiology changed dose of ACE but still has dry cough.    Current Activity/ Functional Status:  Patient is independent with mobility/ambulation, transfers, ADL's, IADL's.   Zubrod Score: At the time of surgery this patient's most appropriate activity status/level should be described as: [x]     0    Normal activity, no symptoms []     1    Restricted in physical strenuous activity but ambulatory, able to do out light work []     2    Ambulatory and capable of self care, unable to do work activities, up and about               >50 % of waking hours                              []     3    Only limited self care, in bed greater than 50% of  waking hours []     4    Completely disabled, no self care, confined to bed or chair []     5    Moribund   Past Medical History  Diagnosis Date  . Bladder cancer 08/16/2008    [facility] then [place] now [place])  . Hypertension   . AAA (abdominal aortic aneurysm) 03/25/12    s/p endovascular repair  . Hx of migraines   . Contact dermatitis     atypical [name])  . Childhood asthma   . Environmental allergies     improved as ages  . Diverticulosis 2013    severe by CT and colonoscopy  . Adenomatous polyp 06/2009  . Lumbosacral radiculopathy at S1 03/2012    left, with spinal stenosis (MRI 04/2013) improved with TF ESI L5/S1 and S1/2 ([facility])  . Spinal stenosis     LS1 nerve root impingement from bulging disc  . Coronary artery disease   . Zenker diverticulum     Past Surgical History  Procedure Laterality Date  . Tonsillectomy and adenoidectomy  1973  . Vasectomy    .  Bladder cancer      ([place]) x 2  . Abdominal aortic aneurysm repair  03/25/12  . Colonoscopy  06/2012    hyperplastic polyp, diverticulosis ([facility]) rec rpt 5 yrs  . Cystoscopy  08/16/08    Bladder Cancer  . Esi  04/2013, 06/2013    L L5S1, S12 transforaminal ESI (Dr.  [name])  . Abi  05/2013    WNL, L TBI low at 0.66  . Coronary artery bypass graft N/A 02/07/2014    Procedure: CORONARY ARTERY BYPASS GRAFTING (CABG);  Surgeon: [name], MD;  Location: [facility];  Service: Open Heart Surgery;  Laterality: N/A;  CABG X 3, BILATERAL LIMA, EVH  . Intraoperative transesophageal echocardiogram N/A 02/07/2014    Procedure: INTRAOPERATIVE TRANSESOPHAGEAL ECHOCARDIOGRAM;  Surgeon: [name], MD;  Location: [facility];  Service: Open Heart Surgery;  Laterality: N/A;  . Esi Left 04/2014, 05/2014    L5/S1; S1/2, rpt    Family History  Problem Relation Age of Onset  . Diabetes Mother   . Arrhythmia Mother     pacemaker  . Cancer Mother     Bladder  . COPD Mother   . Thyroid disease  Mother   . Hyperlipidemia Mother   . Hypertension Mother   . Diabetes Father   . CAD Father 91    CHF, MI  . Dementia Father   . Diabetes Sister   . Colon cancer Neg Hx   . Diabetes Brother   . Hypertension Brother   . Heart attack Maternal Grandmother   . Heart attack Father     History   Social History  . Marital Status: Married    Spouse Name: N/A    Number of Children: 2  . Years of Education: N/A   Occupational History  . Self Employed    Social History Main Topics  . Smoking status: Former Smoker -- 1.00 packs/day for 40 years    Types: Cigarettes    Quit date: 02/05/2014  . Smokeless tobacco: Never Used  . Alcohol Use: Yes     Comment: Rare- states 1 oz. per week  . Drug Use: No  . Sexual Activity: Not on file   Other Topics Concern  . Not on file   Social History Narrative   Caffeine: 1 cup coffee/day   Lives with wife, 1 dog   grown children   Occupation: Therapist, sports - self employed   Edu: 2 yrs college   Activity: fishing, walking occasionally   Diet: good water, fruits/vegetables daily    History  Smoking status  . Former Smoker -- 1.00 packs/day for 40 years  . Types: Cigarettes  . Quit date: 02/05/2014  Smokeless tobacco  . Never Used    History  Alcohol Use  . Yes    Comment: Rare- states 1 oz. per week     Allergies  Allergen Reactions  . Codeine Other (See Comments)    Nightmares    Current Outpatient Prescriptions  Medication Sig Dispense Refill  . aspirin EC 325 MG EC tablet Take 1 tablet (325 mg total) by mouth daily.    [name] atorvastatin (LIPITOR) 20 MG tablet Take 1 tablet (20 mg total) by mouth daily at 6 PM. 90 tablet 1  . DULERA 100-5 MCG/ACT AERO     . Glucosamine-Chondroit-Vit C-Mn (GLUCOSAMINE 1500 COMPLEX PO) Take 2 tablets by mouth daily.    . hydrochlorothiazide (MICROZIDE) 12.5 MG capsule Take 12.5 mg by mouth daily.    [name]  levalbuterol (XOPENEX HFA) 45 MCG/ACT inhaler Inhale 1-2 puffs into the lungs  every 6 (six) hours as needed for wheezing. 1 Inhaler 12  . lisinopril (PRINIVIL,ZESTRIL) 20 MG tablet Take 1 tablet (20 mg total) by mouth daily. 90 tablet 3  . metoprolol succinate (TOPROL-XL) 25 MG 24 hr tablet Take 1 tablet (25 mg total) by mouth daily. 90 tablet 3  . Multiple Vitamins-Minerals (CENTRUM SILVER ADULT 50+ PO) Take 1 capsule by mouth daily.     No current facility-administered medications for this visit.     Review of Systems:     Cardiac Review of Systems: Y or N  Chest Pain [  n  ]  Resting SOB [n   ] Exertional SOB  [n  ]  Orthopnea [ n ]   Pedal Edema [n   ]    Palpitations Florencio.Farrier ] Syncope  [ n ]   Presyncope [ n  ]  General Review of Systems: [Y] = yes [  ]=no Constitional: recent weight change [ n ];  Wt loss over the last 3 months [   ] anorexia [  ]; fatigue [  ]; nausea [  ]; night sweats [  ]; fever [  ]; or chills [  ];          Dental: poor dentition[  ]; Last Dentist visit:   Eye : blurred vision [  ]; diplopia [   ]; vision changes [  ];  Amaurosis fugax[  ]; Resp: cough [  ];  wheezing[y  ];  hemoptysis[  ]; shortness of breath[  ]; paroxysmal nocturnal dyspnea[  ]; dyspnea on exertion[  ]; or orthopnea[  ];  GI:  gallstones[n  ], vomiting[ n ];  dysphagia[ y ]; melena[  ];  hematochezia [  ]; heartburn[  ];   Hx of  Colonoscopy[ y ]; GU: kidney stones [  ]; hematuria[  ];   dysuria [  ];  nocturia[  ];  history of     obstruction [  ]; urinary frequency [  ]             Skin: rash, swelling[  ];, hair loss[  ];  peripheral edema[  ];  or itching[  ]; Musculosketetal: myalgias[  ];  joint swelling[  ];  joint erythema[  ];  joint pain[  ];  back pain[  ];  Heme/Lymph: bruising[  ];  bleeding[  ];  anemia[  ];  Neuro: TIA[  ];  headaches[  ];  stroke[  ];  vertigo[  ];  seizures[  ];   paresthesias[  ];  difficulty walking[  ];  Psych:depression[  ]; anxiety[  ];  Endocrine: diabetes[  ];  thyroid dysfunction[  ];  Immunizations: Flu up to date [ Y ];  Pneumococcal up to date Jazmín.Cullens  ];  Other:  Physical Exam: BP 122/79 mmHg  Pulse 63  Resp 16  Ht 5\' 7"  (1.702 m)  Wt 197 lb (89.359 kg)  BMI 30.85 kg/m2  SpO2 98%  PHYSICAL EXAMINATION:  General appearance: alert, cooperative and appears older than stated age Neurologic: intact Heart: regular rate and rhythm, S1, S2 normal, no murmur, click, rub or gallop Lungs: clear to auscultation bilaterally Abdomen: soft, non-tender; bowel sounds normal; no masses,  no organomegaly Extremities: extremities normal, atraumatic, no cyanosis or edema and Homans sign is negative, no sign of DVT Wound: Sternal incision is well-healed Patient has no cervical or supraclavicular adenopathy no neck  tenderness, on digital exam of the mouth there are no palpable masses appreciated on the tongue, visual exam of the oral cavity reveals no abnormality  Diagnostic Studies & Laboratory data:     Recent Radiology Findings:  Dg Chest 2 View  04/16/2014   CLINICAL DATA:  Coronary artery disease. COPD and hypertension. Former smoker.  EXAM: CHEST  2 VIEW  COMPARISON:  03/19/2014  FINDINGS: Sternotomy wires are unchanged. Lungs are adequately inflated without consolidation or effusion. There is flattening of the hemidiaphragms on the lateral film. Cardiomediastinal silhouette and remainder of the exam is unchanged.  IMPRESSION: No active cardiopulmonary disease.   Electronically Signed   By: Marin Olp M.D.   On: 04/16/2014 10:20   Dg Esophagus  04/25/2014   CLINICAL DATA:  Difficulty swallowing pills and food. Choking sensation mild eating. Reflux. Cough.  EXAM: ESOPHOGRAM / BARIUM SWALLOW / BARIUM TABLET STUDY  TECHNIQUE: Combined double contrast and single contrast examination performed using effervescent crystals, thick barium liquid, and thin barium liquid. The patient was observed with fluoroscopy swallowing a 73mm barium sulphate tablet.  FLUOROSCOPY TIME:  1 min, 26 seconds  COMPARISON:  None.  FINDINGS:  Pharyngeal phase of swallowing reveals a small Zenker' s diverticulum as on image 9 series 2.  Occasional tertiary contractions noted in the esophagus. Primary peristaltic waves in the esophagus uninterrupted on 3 out of 4 swallows, within normal limits.  No esophageal ulceration or significant constriction identified. A 13 mm barium tablet passed briskly to the stomach, but the 80 listed the patient has feelings of globus sensation for up to 30 seconds afterwards.  IMPRESSION: 1. Small Zenker's diverticulum. 2. The patient experienced globus sensation after swallowing the barium pill, even though the barium pill passed briskly into the stomach. No esophageal stricture identified.   Electronically Signed   By: Sherryl Barters M.D.   On: 04/25/2014 14:13        Recent Lab Findings: Lab Results  Component Value Date   WBC 15.5* 02/12/2014   HGB 12.3* 02/12/2014   HCT 36.7* 02/12/2014   PLT 330 02/12/2014   GLUCOSE 56* 03/06/2014   CHOL 140 04/15/2014   TRIG 168.0* 04/15/2014   HDL 40.70 04/15/2014   LDLDIRECT 105 01/05/2012   LDLCALC 66 04/15/2014   ALT 10 04/15/2014   AST 18 04/15/2014   NA 139 03/06/2014   K 4.0 03/06/2014   CL 102 03/06/2014   CREATININE 0.7 03/06/2014   BUN 20 03/06/2014   CO2 27 03/06/2014   TSH 1.591 01/04/2012   INR 1.23 02/07/2014   HGBA1C 5.8* 02/06/2014      Assessment / Plan:  Patient with probable upper esophageal sphincter dysfunction and small Zenker's diverticulum Cough possibly related to lisinopril I offered the patient to proceed with evaluation of this esophageal problems with upper GI endoscopy, ENT referral and esophageal manometrics. He notes that the symptoms are better than they had been and he prefers not to proceed with this now. He will return in 3 months or sooner if symptoms worsen He is in contact with his cardiologist about adjusting his blood pressure medication   Grace Isaac MD      [address] [place] [address] Office [phone]   Beeper [phone]  05/30/2014 1:09 PM

## 2014-05-31 ENCOUNTER — Encounter (HOSPITAL_COMMUNITY)
Admission: RE | Admit: 2014-05-31 | Discharge: 2014-05-31 | Disposition: A | Discharge status: Home / Self Care | Payer: BC Managed Care – PPO | Source: Ambulatory Visit | Attending: Cardiovascular Disease | Admitting: Cardiovascular Disease

## 2014-05-31 DIAGNOSIS — Z5189 Encounter for other specified aftercare: Secondary | ICD-10-CM | POA: Diagnosis not present

## 2014-06-03 ENCOUNTER — Encounter (HOSPITAL_COMMUNITY)
Admission: RE | Admit: 2014-06-03 | Discharge: 2014-06-03 | Disposition: A | Discharge status: Home / Self Care | Payer: BC Managed Care – PPO | Source: Ambulatory Visit | Attending: Cardiovascular Disease | Admitting: Cardiovascular Disease

## 2014-06-03 ENCOUNTER — Encounter: Payer: Self-pay | Admitting: Cardiovascular Disease

## 2014-06-03 ENCOUNTER — Telehealth: Payer: Self-pay

## 2014-06-03 DIAGNOSIS — Z95828 Presence of other vascular implants and grafts: Secondary | ICD-10-CM

## 2014-06-03 DIAGNOSIS — Z5189 Encounter for other specified aftercare: Secondary | ICD-10-CM | POA: Diagnosis not present

## 2014-06-03 DIAGNOSIS — R1030 Lower abdominal pain, unspecified: Secondary | ICD-10-CM

## 2014-06-03 DIAGNOSIS — M549 Dorsalgia, unspecified: Secondary | ICD-10-CM

## 2014-06-03 NOTE — Telephone Encounter (Signed)
Phone call from pt.  Reported he has been having abdominal and back discomfort, intermittently, and voiced concern that "the aneurysm had not shrunk in size" at the last evaluation, and requesting to schedule another appt. with Dr. [name].  Reported that the pain is in a region approx. size of palm of hand with fingers extended from mid back down toward the tailbone.  Stated it covers approx 6-8 " area.  Reported the pain in the back is similar to that which he had prior to the stent graft repair of his aneurysm.  Also stated he gets a full feeling every time he eats something; "it can be as small as a pack of crackers."  Reported the past 2-3 mos. he has been more fatigued.  Reported intermittent dizziness.  Stated his BP medication has been adjusted by the cardiologist.  Reported BP's of 89/52, 90/60, and 104/58.  Stated he doesn't think the dizziness is related to the low BP, or medication change, since the last medication change was 2 weeks ago.   Discussed with Dr. [name].  Rec'd new order to schedule for CTA Abdomen and Pelvis to evaluate the Stent Graft Repair of the aneurysm.  Notified pt. of the recommendation.  Agreed with plan.

## 2014-06-04 NOTE — Telephone Encounter (Signed)
I spoke with Carl Lee to notify of appointment at [facility] on 06/05/14. He will arrive at 8:30am for blood work to check his BUN and Creatinine. His scan will start at 9:30. He is aware that he needs to be NPO for 4 hours prior to exam.  I have sent the request for authorization to [facility] via [employer].   I have made Radiology aware to call with results.

## 2014-06-05 ENCOUNTER — Ambulatory Visit (HOSPITAL_COMMUNITY)
Admission: RE | Admit: 2014-06-05 | Discharge: 2014-06-05 | Disposition: A | Discharge status: Home / Self Care | Payer: BC Managed Care – PPO | Source: Ambulatory Visit | Attending: Surgery | Admitting: Surgery

## 2014-06-05 ENCOUNTER — Encounter (HOSPITAL_COMMUNITY)
Admission: RE | Admit: 2014-06-05 | Discharge: 2014-06-05 | Disposition: A | Discharge status: Home / Self Care | Payer: BC Managed Care – PPO | Source: Ambulatory Visit | Attending: Cardiovascular Disease | Admitting: Cardiovascular Disease

## 2014-06-05 DIAGNOSIS — K5730 Diverticulosis of large intestine without perforation or abscess without bleeding: Secondary | ICD-10-CM | POA: Insufficient documentation

## 2014-06-05 DIAGNOSIS — M549 Dorsalgia, unspecified: Secondary | ICD-10-CM

## 2014-06-05 DIAGNOSIS — M4697 Unspecified inflammatory spondylopathy, lumbosacral region: Secondary | ICD-10-CM | POA: Diagnosis not present

## 2014-06-05 DIAGNOSIS — I10 Essential (primary) hypertension: Secondary | ICD-10-CM | POA: Diagnosis present

## 2014-06-05 DIAGNOSIS — Z95828 Presence of other vascular implants and grafts: Secondary | ICD-10-CM

## 2014-06-05 DIAGNOSIS — K5710 Diverticulosis of small intestine without perforation or abscess without bleeding: Secondary | ICD-10-CM | POA: Diagnosis not present

## 2014-06-05 DIAGNOSIS — M5137 Other intervertebral disc degeneration, lumbosacral region: Secondary | ICD-10-CM | POA: Insufficient documentation

## 2014-06-05 DIAGNOSIS — R109 Unspecified abdominal pain: Secondary | ICD-10-CM | POA: Diagnosis present

## 2014-06-05 DIAGNOSIS — N281 Cyst of kidney, acquired: Secondary | ICD-10-CM | POA: Diagnosis not present

## 2014-06-05 DIAGNOSIS — M4696 Unspecified inflammatory spondylopathy, lumbar region: Secondary | ICD-10-CM | POA: Diagnosis not present

## 2014-06-05 DIAGNOSIS — R5383 Other fatigue: Secondary | ICD-10-CM | POA: Diagnosis present

## 2014-06-05 DIAGNOSIS — R1030 Lower abdominal pain, unspecified: Secondary | ICD-10-CM

## 2014-06-05 DIAGNOSIS — I2510 Atherosclerotic heart disease of native coronary artery without angina pectoris: Secondary | ICD-10-CM | POA: Diagnosis not present

## 2014-06-05 DIAGNOSIS — I714 Abdominal aortic aneurysm, without rupture: Secondary | ICD-10-CM | POA: Diagnosis not present

## 2014-06-05 DIAGNOSIS — Z5189 Encounter for other specified aftercare: Secondary | ICD-10-CM | POA: Diagnosis not present

## 2014-06-05 LAB — POCT I-STAT CREATININE: Creatinine, Ser: 0.8 mg/dL (ref 0.50–1.35)

## 2014-06-05 MED ORDER — IOHEXOL 350 MG/ML SOLN
100.0000 mL | Freq: Once | INTRAVENOUS | Status: AC | PRN
  Administered 2014-06-05: 100 mL via INTRAVENOUS

## 2014-06-08 ENCOUNTER — Other Ambulatory Visit: Payer: Self-pay | Admitting: Family Medicine

## 2014-06-08 DIAGNOSIS — E118 Type 2 diabetes mellitus with unspecified complications: Secondary | ICD-10-CM | POA: Insufficient documentation

## 2014-06-08 DIAGNOSIS — I10 Essential (primary) hypertension: Secondary | ICD-10-CM

## 2014-06-08 DIAGNOSIS — R7303 Prediabetes: Secondary | ICD-10-CM | POA: Insufficient documentation

## 2014-06-08 HISTORY — DX: Type 2 diabetes mellitus with unspecified complications: E11.8

## 2014-06-09 ENCOUNTER — Encounter: Payer: Self-pay | Admitting: Family Medicine

## 2014-06-10 ENCOUNTER — Encounter: Payer: Self-pay | Admitting: Physician Assistant

## 2014-06-10 ENCOUNTER — Ambulatory Visit (INDEPENDENT_AMBULATORY_CARE_PROVIDER_SITE_OTHER): Payer: BC Managed Care – PPO | Admitting: Physician Assistant

## 2014-06-10 ENCOUNTER — Encounter (HOSPITAL_COMMUNITY)
Admission: RE | Admit: 2014-06-10 | Discharge: 2014-06-10 | Disposition: A | Discharge status: Home / Self Care | Payer: BC Managed Care – PPO | Source: Ambulatory Visit | Attending: Cardiovascular Disease | Admitting: Cardiovascular Disease

## 2014-06-10 ENCOUNTER — Telehealth: Payer: Self-pay | Admitting: *Deleted

## 2014-06-10 VITALS — BP 109/70 | HR 87 | Ht 67.0 in | Wt 199.0 lb

## 2014-06-10 DIAGNOSIS — R5383 Other fatigue: Secondary | ICD-10-CM

## 2014-06-10 DIAGNOSIS — J449 Chronic obstructive pulmonary disease, unspecified: Secondary | ICD-10-CM

## 2014-06-10 DIAGNOSIS — E785 Hyperlipidemia, unspecified: Secondary | ICD-10-CM

## 2014-06-10 DIAGNOSIS — Z8679 Personal history of other diseases of the circulatory system: Secondary | ICD-10-CM

## 2014-06-10 DIAGNOSIS — Z5189 Encounter for other specified aftercare: Secondary | ICD-10-CM | POA: Diagnosis not present

## 2014-06-10 DIAGNOSIS — I10 Essential (primary) hypertension: Secondary | ICD-10-CM

## 2014-06-10 DIAGNOSIS — Z95828 Presence of other vascular implants and grafts: Secondary | ICD-10-CM

## 2014-06-10 DIAGNOSIS — R079 Chest pain, unspecified: Secondary | ICD-10-CM

## 2014-06-10 DIAGNOSIS — I2510 Atherosclerotic heart disease of native coronary artery without angina pectoris: Secondary | ICD-10-CM

## 2014-06-10 DIAGNOSIS — R0683 Snoring: Secondary | ICD-10-CM

## 2014-06-10 DIAGNOSIS — R002 Palpitations: Secondary | ICD-10-CM

## 2014-06-10 DIAGNOSIS — K225 Diverticulum of esophagus, acquired: Secondary | ICD-10-CM

## 2014-06-10 LAB — CBC WITH DIFFERENTIAL/PLATELET
BASOS ABS: 0.1 10*3/uL (ref 0.0–0.1)
Basophils Relative: 0.5 % (ref 0.0–3.0)
Eosinophils Absolute: 0.4 10*3/uL (ref 0.0–0.7)
Eosinophils Relative: 3 % (ref 0.0–5.0)
HCT: 47.7 % (ref 39.0–52.0)
HEMOGLOBIN: 15.7 g/dL (ref 13.0–17.0)
LYMPHS ABS: 2.2 10*3/uL (ref 0.7–4.0)
Lymphocytes Relative: 18.2 % (ref 12.0–46.0)
MCHC: 33 g/dL (ref 30.0–36.0)
MCV: 91.4 fl (ref 78.0–100.0)
MONO ABS: 0.8 10*3/uL (ref 0.1–1.0)
Monocytes Relative: 6.3 % (ref 3.0–12.0)
NEUTROS ABS: 8.8 10*3/uL — AB (ref 1.4–7.7)
Neutrophils Relative %: 72 % (ref 43.0–77.0)
PLATELETS: 270 10*3/uL (ref 150.0–400.0)
RBC: 5.22 Mil/uL (ref 4.22–5.81)
RDW: 14.6 % (ref 11.5–15.5)
WBC: 12.2 10*3/uL — ABNORMAL HIGH (ref 4.0–10.5)

## 2014-06-10 LAB — BASIC METABOLIC PANEL
BUN: 22 mg/dL (ref 6–23)
CHLORIDE: 100 meq/L (ref 96–112)
CO2: 27 meq/L (ref 19–32)
Calcium: 8.9 mg/dL (ref 8.4–10.5)
Creatinine, Ser: 0.8 mg/dL (ref 0.4–1.5)
GFR: 112.83 mL/min (ref >60.00)
GLUCOSE: 92 mg/dL (ref 70–99)
Potassium: 3.7 mEq/L (ref 3.5–5.1)
SODIUM: 136 meq/L (ref 135–145)

## 2014-06-10 MED ORDER — FAMOTIDINE 20 MG PO TABS: 20.0000 mg | ORAL_TABLET | Freq: Two times a day (BID) | ORAL | Status: DC

## 2014-06-10 MED ORDER — METOPROLOL SUCCINATE ER 25 MG PO TB24: 12.5000 mg | ORAL_TABLET | Freq: Every day | ORAL | Status: DC

## 2014-06-10 NOTE — Patient Instructions (Addendum)
LAB WORK TODAY; BMET, CBC W/DIFF  Your physician has recommended that you wear a 48 HOUR holter monitor. Holter monitors are medical devices that record the heart's electrical activity. Doctors most often use these monitors to diagnose arrhythmias. Arrhythmias are problems with the speed or rhythm of the heartbeat. The monitor is a small, portable device. You can wear one while you do your normal daily activities. This is usually used to diagnose what is causing palpitations/syncope (passing out).  Your physician has recommended that you have a SPLIT NIGHT sleep study. This test records several body functions during sleep, including: brain activity, eye movement, oxygen and carbon dioxide blood levels, heart rate and rhythm, breathing rate and rhythm, the flow of air through your mouth and nose, snoring, body muscle movements, and chest and belly movement.  Your physician recommends that you schedule a follow-up appointment in: 1 MONTH WITH DR. [name]  START OTC PEPCID 20 MG 1 TABLET TWICE DAILY  STOP HCTZ  DECREASE TOPROL TO 12.5 MG DAILY  WE WILL LET CARDIAC REHAB KNOW THAT IT IS OK TO LET YOUR BP GO TO 160/90 WITH EXERCISE

## 2014-06-10 NOTE — Telephone Encounter (Signed)
both pt and his wife have been notified about lab results and advised to f/u w/PCP due to WBC still elevated. Both verbalized understanding to results and Plan of Care.

## 2014-06-10 NOTE — Progress Notes (Signed)
Cardiology Office Note   Date:  06/10/2014   ID:  [name], DOB March 22, 1956, MRN [id number]  PCP:  [name], MD  Cardiologist:  Dr. [name]     History of Present Illness: Carl Lee is a 58 y.o. male with a hx of recurrent bladder CA, mild carotid stenosis followed by VVS, AAA s/p endovascular repair (9/13 Dr. [name]), tobacco abuse, HL, HTN. He was evaluated for pre-op clearance with a stress myoview in July 2015. This was high risk with large scar burden and LHC was arranged.  This demonstrated 3 v CAD and he underwent CABG with Dr. [name] (L-LAD, RIMA-OM, S-PDA). Post op course was c/b increased pulmonary secretions requiring reintubation.   He has had difficulty swallowing since his CABG.  A barium swallow showed some primary esophageal dysphagia and a small Zenker's diverticulum.  He has been evaluated by Dr. Servando Snare.  Last seen by Dr. [name] in 04/2014.    Patient returns today with several complaints. 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 He has tried to go back to work recently. He is not lifting that much. However, he has noted some chest discomfort. This is worse with positional changes as well as with palpation. He also notes low blood pressures. He is symptomatic with this. He is lightheaded and feels he loses his balance. He describes some symptoms that are suspicious for near syncope. He brings in a list of his blood pressures. He's had readings as low as 88/57. He denies syncope. He denies significant change in his baseline dyspnea. Overall, he tells me that he just does not feel well. Of note, his blood pressure machine at home often alerts him with an irregular heartbeat.  He often feels a skipped beat. He had some abdominal and back pain recently. His vascular surgeon obtained an abdominal and pelvic CTA. This demonstrated a stable AAA repair without endoleak.  Studies:  - LHC (7/15): pLAD 30 then 100  (L-L collats), pCFX 80, mCFX 50, pRCA 100 (R-R and L-R collats), EF 50%, ant HK and poss LV apical aneurysm - Nuclear (7/15): Inferior and anteroseptal, mid and dist anterior and dist inferior and apical fixed defect, no ischemia, EF 49%; High Risk - Carotid US (7/15): Bilateral ICA 1-39%   Recent Labs: 02/12/2014: Hemoglobin 12.3* 03/06/2014: BUN 20; Potassium 4.0; Sodium 139 04/15/2014: ALT 10; LDL (calc) 66 06/05/2014: Creatinine 0.80    Recent Radiology: Ct Angio Abd/pel W/ And/or W/o  06/05/2014   IMPRESSION: VASCULAR  1. Successful endovascular abdominal aortic aneurysm repair with near total resolution of the previously identified and now excluded aneurysm sac. Today, the sac measures 4.5 x 3.5 cm compared to 4.8 x 4.9 cm previously. No evidence of endoleak or other complicating feature. 2. Developing mild aneurysmal dilatation of the right common iliac artery in the region of the right iliac limb landing zone. The common iliac artery measures 2.2 cm today compared to 1.7 cm previously. 3. Mild atherosclerotic vascular disease as described above without evidence of significant stenosis. NON VASCULAR  1. Colonic diverticulosis without evidence of active diverticulitis. 2. Large duodenum diverticula arise from the horizontal segment of the duodenum. Given the patient's clinical history, consider clinical evaluation for signs and symptoms of bacterial overgrowth. 3. Stable small renal cortical scarring in the interpolar left kidney. 4. Additional ancillary findings as above without significant interval change. Signed,  Criselda Peaches, MD  [facility]  [facility]  Radiology   Electronically Signed   By: Jacqulynn Cadet M.D.   On: 06/05/2014 10:14      Wt Readings from Last 3 Encounters:  06/10/14 199 lb (90.266 kg)  05/30/14 197 lb (89.359 kg)  05/06/14 197 lb (89.359 kg)     Past Medical History  Diagnosis Date  . Bladder cancer 08/16/2008     [facility] then [place] now [place])  . Hypertension   . AAA (abdominal aortic aneurysm) 03/25/12    s/p endovascular repair  . Hx of migraines   . Contact dermatitis     atypical [name])  . Childhood asthma   . Environmental allergies     improved as ages  . Diverticulosis 2013    severe by CT and colonoscopy  . Adenomatous polyp 06/2009  . Lumbosacral radiculopathy at S1 03/2012    left, with spinal stenosis (MRI 04/2013) improved with TF ESI L5/S1 and S1/2 ([facility] [place])  . Spinal stenosis     LS1 nerve root impingement from bulging disc  . Coronary artery disease   . Zenker diverticulum   . Resistance to clopidogrel 2015    drug metabolism panel run - asked to scan    Current Outpatient Prescriptions  Medication Sig Dispense Refill  . aspirin EC 325 MG EC tablet Take 1 tablet (325 mg total) by mouth daily.    [name] atorvastatin (LIPITOR) 20 MG tablet Take 1 tablet (20 mg total) by mouth daily at 6 PM. 90 tablet 1  . DULERA 100-5 MCG/ACT AERO Inhale 2 puffs into the lungs as needed.     . Glucosamine-Chondroit-Vit C-Mn (GLUCOSAMINE 1500 COMPLEX PO) Take 2 tablets by mouth daily.    . hydrochlorothiazide (MICROZIDE) 12.5 MG capsule Take 12.5 mg by mouth daily.    [name] levalbuterol (XOPENEX HFA) 45 MCG/ACT inhaler Inhale 1-2 puffs into the lungs every 6 (six) hours as needed for wheezing. 1 Inhaler 12  . lisinopril (PRINIVIL,ZESTRIL) 20 MG tablet Take 1 tablet (20 mg total) by mouth daily. 90 tablet 3  . metoprolol succinate (TOPROL-XL) 25 MG 24 hr tablet Take 1 tablet (25 mg total) by mouth daily. 90 tablet 3  . Multiple Vitamins-Minerals (CENTRUM SILVER ADULT 50+ PO) Take 1 capsule by mouth daily.     No current facility-administered medications for this visit.     Allergies:   Codeine   Social History:  The patient  reports that he quit smoking about 4 months ago. His smoking use included Cigarettes. He has a 40 pack-year smoking history. He has never used smokeless tobacco. He  reports that he drinks alcohol. He reports that he does not use illicit drugs.   Family History:  The patient's family history includes Arrhythmia in his mother; CAD (age of onset: 23) in his father; COPD in his mother; Cancer in his mother; Dementia in his father; Diabetes in his brother, father, mother, and sister; Heart attack in his father and maternal grandmother; Hyperlipidemia in his mother; Hypertension in his brother and mother; Thyroid disease in his mother. There is no history of Colon cancer.    ROS:  Please see the history of present illness.   His wife notes that he does snore. She has witnessed apneic episodes. He does admit to fatigue and daytime hypersomnolence.   All other systems reviewed and negative.    PHYSICAL EXAM: VS:  BP 109/70 mmHg  Pulse 87  Ht 5\' 7"  (1.702 m)  Wt 199 lb (90.266 kg)  BMI 31.16 kg/m2 Well nourished,  well developed, in no acute distress HEENT: normal Neck: no JVD Cardiac:  normal S1, S2;  RRR; no murmur   Chest:  Somewhat tender to palp.  No grinding sensation with manipulation of his sternum Lungs:   clear to auscultation bilaterally, no wheezing, rhonchi or rales Abd: soft, nontender, no hepatomegaly Ext: no edema Skin: warm and dry Neuro:  CNs 2-12 intact, no focal abnormalities noted  EKG:  NSR, HR 87, normal axis, septal Q waves, no significant change compared to prior tracing      ASSESSMENT AND PLAN:  1.  Essential hypertension:  BP has been running low.  I believe he is symptomatic from this.    -  Decrease Toprol-XL to 12.5 mg QD.    -  DC HCTZ    -  I will alert Cardiac Rehab to allow his BP to go higher during exercise (he tells me that he has to stop when his BP is 120/90).  He can go to 160/90.    -  Check BMET, CBC today.   2.  Chest pain, unspecified chest pain type:  This is atypical.  I believe this is MSK.  I have asked him to slowly try to get back into work.  I reviewed his case with Dr. [name].  We do not believe  that he requires stress testing at this time. 3.  Snoring:  It sounds like he has sleep apnea.  This may be contributing to some of his symptoms.    -  Arrange split night sleep study. 4.  Other fatigue:  May be related to OSA or his beta blocker.    -  Arrange sleep study as noted.    -  Reduce BB dose as noted.  We may need to DC the Toprol-XL eventually.    -  BMET, CBC today. 5.  Palpitations:  Etiology not clear.     -  Holter monitor - 48 hour    -  Check BMET. 6.  Coronary artery disease:  Continue aspirin, statin, beta blocker. 7.  Dyslipidemia:   Continue statin.  8.  History of repair of aneurysm of abdominal aorta using endovascular stent graft:  FU with VVS as planned.  9.  COPD, moderate:  FU with pulmonary.  10. Zenker's diverticulum:  FU with Dr. Servando Snare as planned.   Disposition:   FU with Dr. [name] 1 month.    Signed, Versie Starks, MHS 06/10/2014 1:38 PM    [facility] [address], [place], [place]  [address] Phone: [phone]; Fax: [phone]

## 2014-06-12 ENCOUNTER — Encounter (HOSPITAL_COMMUNITY)
Admission: RE | Admit: 2014-06-12 | Discharge: 2014-06-12 | Disposition: A | Discharge status: Home / Self Care | Payer: BC Managed Care – PPO | Source: Ambulatory Visit | Attending: Cardiovascular Disease | Admitting: Cardiovascular Disease

## 2014-06-12 ENCOUNTER — Encounter (INDEPENDENT_AMBULATORY_CARE_PROVIDER_SITE_OTHER): Payer: BC Managed Care – PPO

## 2014-06-12 ENCOUNTER — Encounter: Payer: Self-pay | Admitting: *Deleted

## 2014-06-12 DIAGNOSIS — I10 Essential (primary) hypertension: Secondary | ICD-10-CM | POA: Diagnosis not present

## 2014-06-12 DIAGNOSIS — J9610 Chronic respiratory failure, unspecified whether with hypoxia or hypercapnia: Secondary | ICD-10-CM | POA: Insufficient documentation

## 2014-06-12 DIAGNOSIS — I2582 Chronic total occlusion of coronary artery: Secondary | ICD-10-CM | POA: Diagnosis not present

## 2014-06-12 DIAGNOSIS — I2510 Atherosclerotic heart disease of native coronary artery without angina pectoris: Secondary | ICD-10-CM | POA: Insufficient documentation

## 2014-06-12 DIAGNOSIS — J449 Chronic obstructive pulmonary disease, unspecified: Secondary | ICD-10-CM | POA: Insufficient documentation

## 2014-06-12 DIAGNOSIS — I200 Unstable angina: Secondary | ICD-10-CM | POA: Insufficient documentation

## 2014-06-12 DIAGNOSIS — I739 Peripheral vascular disease, unspecified: Secondary | ICD-10-CM | POA: Diagnosis not present

## 2014-06-12 DIAGNOSIS — Z885 Allergy status to narcotic agent status: Secondary | ICD-10-CM | POA: Insufficient documentation

## 2014-06-12 DIAGNOSIS — Z7982 Long term (current) use of aspirin: Secondary | ICD-10-CM | POA: Insufficient documentation

## 2014-06-12 DIAGNOSIS — Z8249 Family history of ischemic heart disease and other diseases of the circulatory system: Secondary | ICD-10-CM | POA: Diagnosis not present

## 2014-06-12 DIAGNOSIS — R002 Palpitations: Secondary | ICD-10-CM

## 2014-06-12 DIAGNOSIS — E780 Pure hypercholesterolemia: Secondary | ICD-10-CM | POA: Diagnosis not present

## 2014-06-12 DIAGNOSIS — Z79899 Other long term (current) drug therapy: Secondary | ICD-10-CM | POA: Diagnosis not present

## 2014-06-12 DIAGNOSIS — Z951 Presence of aortocoronary bypass graft: Secondary | ICD-10-CM | POA: Diagnosis not present

## 2014-06-12 DIAGNOSIS — C679 Malignant neoplasm of bladder, unspecified: Secondary | ICD-10-CM | POA: Insufficient documentation

## 2014-06-12 DIAGNOSIS — F419 Anxiety disorder, unspecified: Secondary | ICD-10-CM | POA: Insufficient documentation

## 2014-06-12 DIAGNOSIS — F17200 Nicotine dependence, unspecified, uncomplicated: Secondary | ICD-10-CM | POA: Diagnosis not present

## 2014-06-12 DIAGNOSIS — Z5189 Encounter for other specified aftercare: Secondary | ICD-10-CM | POA: Insufficient documentation

## 2014-06-12 DIAGNOSIS — E785 Hyperlipidemia, unspecified: Secondary | ICD-10-CM | POA: Diagnosis not present

## 2014-06-12 NOTE — Progress Notes (Signed)
Patient ID: Carl Lee, male   DOB: November 01, 1955, 58 y.o.   MRN: [id number] [employer] 48 hour holter monitor applied to patient.

## 2014-06-13 ENCOUNTER — Other Ambulatory Visit (INDEPENDENT_AMBULATORY_CARE_PROVIDER_SITE_OTHER): Payer: BC Managed Care – PPO

## 2014-06-13 ENCOUNTER — Telehealth: Payer: Self-pay | Admitting: *Deleted

## 2014-06-13 DIAGNOSIS — R7989 Other specified abnormal findings of blood chemistry: Secondary | ICD-10-CM

## 2014-06-13 DIAGNOSIS — R7309 Other abnormal glucose: Secondary | ICD-10-CM

## 2014-06-13 DIAGNOSIS — R7303 Prediabetes: Secondary | ICD-10-CM

## 2014-06-13 DIAGNOSIS — I10 Essential (primary) hypertension: Secondary | ICD-10-CM

## 2014-06-13 LAB — CBC WITH DIFFERENTIAL/PLATELET
Basophils Absolute: 0 10*3/uL (ref 0.0–0.1)
Basophils Relative: 0.3 % (ref 0.0–3.0)
EOS PCT: 3.6 % (ref 0.0–5.0)
Eosinophils Absolute: 0.4 10*3/uL (ref 0.0–0.7)
HEMATOCRIT: 47.2 % (ref 39.0–52.0)
Hemoglobin: 15.5 g/dL (ref 13.0–17.0)
LYMPHS ABS: 2.2 10*3/uL (ref 0.7–4.0)
Lymphocytes Relative: 21.9 % (ref 12.0–46.0)
MCHC: 32.8 g/dL (ref 30.0–36.0)
MCV: 92.7 fl (ref 78.0–100.0)
MONOS PCT: 6.8 % (ref 3.0–12.0)
Monocytes Absolute: 0.7 10*3/uL (ref 0.1–1.0)
Neutro Abs: 6.9 10*3/uL (ref 1.4–7.7)
Neutrophils Relative %: 67.4 % (ref 43.0–77.0)
Platelets: 263 10*3/uL (ref 150.0–400.0)
RBC: 5.09 Mil/uL (ref 4.22–5.81)
RDW: 14.9 % (ref 11.5–15.5)
WBC: 10.2 10*3/uL (ref 4.0–10.5)

## 2014-06-13 LAB — MICROALBUMIN / CREATININE URINE RATIO
Creatinine,U: 122.4 mg/dL
Microalb Creat Ratio: 0.7 mg/g (ref 0.0–30.0)
Microalb, Ur: 0.8 mg/dL (ref 0.0–1.9)

## 2014-06-13 LAB — HEMOGLOBIN A1C: HEMOGLOBIN A1C: 6.8 % — AB (ref 4.6–6.5)

## 2014-06-13 NOTE — Telephone Encounter (Signed)
Pt had labs today for an a1c, and micro/album. He wanted to know if he needed a repeat cbc, since his wbc was elevated when checked by cardio on Mon. They wanted him to f/u with you re: elevated wbc, so he has an appt with you on 12/7 for that, then a cpx with you on 12/10. Do you want to repeat the cbc, or add any other labs to blood drawn today?

## 2014-06-13 NOTE — Telephone Encounter (Signed)
Sent a request to add cbc

## 2014-06-13 NOTE — Telephone Encounter (Signed)
Yes let's recheck this - plz add CBC to blood in lab indication leukocytosis.

## 2014-06-14 ENCOUNTER — Encounter (HOSPITAL_COMMUNITY)
Admission: RE | Admit: 2014-06-14 | Discharge: 2014-06-14 | Disposition: A | Discharge status: Home / Self Care | Payer: BC Managed Care – PPO | Source: Ambulatory Visit | Attending: Cardiovascular Disease | Admitting: Cardiovascular Disease

## 2014-06-14 DIAGNOSIS — Z5189 Encounter for other specified aftercare: Secondary | ICD-10-CM | POA: Diagnosis not present

## 2014-06-17 ENCOUNTER — Encounter (HOSPITAL_COMMUNITY)
Admission: RE | Admit: 2014-06-17 | Discharge: 2014-06-17 | Disposition: A | Discharge status: Home / Self Care | Payer: BC Managed Care – PPO | Source: Ambulatory Visit | Attending: Cardiovascular Disease | Admitting: Cardiovascular Disease

## 2014-06-17 ENCOUNTER — Ambulatory Visit (INDEPENDENT_AMBULATORY_CARE_PROVIDER_SITE_OTHER): Payer: BC Managed Care – PPO | Admitting: Family Medicine

## 2014-06-17 ENCOUNTER — Encounter: Payer: Self-pay | Admitting: Family Medicine

## 2014-06-17 VITALS — BP 116/68 | HR 68 | Temp 98.2°F | Wt 200.8 lb

## 2014-06-17 DIAGNOSIS — R21 Rash and other nonspecific skin eruption: Secondary | ICD-10-CM

## 2014-06-17 DIAGNOSIS — E118 Type 2 diabetes mellitus with unspecified complications: Secondary | ICD-10-CM

## 2014-06-17 DIAGNOSIS — R42 Dizziness and giddiness: Secondary | ICD-10-CM

## 2014-06-17 DIAGNOSIS — Z5189 Encounter for other specified aftercare: Secondary | ICD-10-CM | POA: Diagnosis not present

## 2014-06-17 DIAGNOSIS — R079 Chest pain, unspecified: Secondary | ICD-10-CM

## 2014-06-17 DIAGNOSIS — I10 Essential (primary) hypertension: Secondary | ICD-10-CM

## 2014-06-17 DIAGNOSIS — Z23 Encounter for immunization: Secondary | ICD-10-CM

## 2014-06-17 DIAGNOSIS — Z87891 Personal history of nicotine dependence: Secondary | ICD-10-CM

## 2014-06-17 LAB — TSH: TSH: 1.5 u[IU]/mL (ref 0.35–4.50)

## 2014-06-17 LAB — T4, FREE: Free T4: 0.92 ng/dL (ref 0.60–1.60)

## 2014-06-17 MED ORDER — ONETOUCH ULTRA SYSTEM W/DEVICE KIT: 1.0000 | PACK | Freq: Once | Status: DC

## 2014-06-17 MED ORDER — FAMOTIDINE 20 MG PO TABS: 20.0000 mg | ORAL_TABLET | Freq: Two times a day (BID) | ORAL | Status: DC

## 2014-06-17 MED ORDER — GLUCOSE BLOOD VI STRP: ORAL_STRIP | Status: DC

## 2014-06-17 NOTE — Assessment & Plan Note (Signed)
Presumed from hypotension however we are at limit to further lower antihypertensive doses 2/2 hypertension during PT. Continue current regimen. I did suggest he check cbg next lightheaded episode.

## 2014-06-17 NOTE — Assessment & Plan Note (Addendum)
Reviewed new dx - currently diet controlled. Reviewed diet changes to keep diabetes under control. Glucose meter sent to pharmacy Discussed importance of regularly scheduled meals

## 2014-06-17 NOTE — Patient Instructions (Addendum)
Flu and pneumonia shot today. Blood work today - to check thyroid. We will see you Friday for physical. Glucose meter sent in to use as needed Good to see you today, call us with questions.

## 2014-06-17 NOTE — Assessment & Plan Note (Signed)
Now stable. Some improvement in fatigue noted with decreased doses.

## 2014-06-17 NOTE — Addendum Note (Signed)
Addended by: Royann Shivers A on: 06/17/2014 12:23 PM   Modules accepted: Orders

## 2014-06-17 NOTE — Assessment & Plan Note (Signed)
Quit July 2015. Encouraged continued abstinence. Will need to stop lifesavers 2/2 sugar. Suggested sugar free hard candy

## 2014-06-17 NOTE — Progress Notes (Signed)
BP 116/68 mmHg  Pulse 68  Temp(Src) 98.2 F (36.8 C) (Oral)  Wt 200 lb 12 oz (91.06 kg)   CC: f/u abnormal CBC  Subjective:    Patient ID: Carl Lee, male    DOB: Sep 09, 1955, 58 y.o.   MRN: [id number]  HPI: Carl Lee is a 58 y.o. male presenting on 06/17/2014 for elevated WBC's   Recently found to have elevated WBC to 12.5 by cardiology, normal on repeat.   S/p CABG 01/2014. Recently say cardiology, med changes reviewed. Holter monitor done recently, no results yet. Persistent chest discomfort. Does not think from over exertion. Staying fatigued and lightheaded (tipsy, not dizzy). Attributes this to low blood pressures. Thinks he's doing some better after med changes. Worse fatigue after PT.  DM - new diagnosis based on A1c of 6.8%. Previous prediabetes dx. Regularly does not check sugars. Compliant with antihyperglycemic regimen which includes: diet controlled.  Denies paresthesias. Pneumovax today. LDL 66.  Endorses some heat intolerance. PT says balance is off. Denies diarrhea/constipation, hair changes.  Skin rash - pruritic and tender described as rough patches throughout trunk, lower legs.   Quit smoking - increased sugared lifesavers recently however.  Relevant past medical, surgical, family and social history reviewed and updated as indicated. Interim medical history since our last visit reviewed. Allergies and medications reviewed and updated.  Current Outpatient Prescriptions on File Prior to Visit  Medication Sig  . aspirin EC 325 MG EC tablet Take 1 tablet (325 mg total) by mouth daily.  [name] atorvastatin (LIPITOR) 20 MG tablet Take 1 tablet (20 mg total) by mouth daily at 6 PM.  . DULERA 100-5 MCG/ACT AERO Inhale 2 puffs into the lungs as needed.   . Glucosamine-Chondroit-Vit C-Mn (GLUCOSAMINE 1500 COMPLEX PO) Take 2 tablets by mouth daily.  [name] levalbuterol (XOPENEX HFA) 45 MCG/ACT inhaler Inhale 1-2 puffs into the lungs every 6 (six) hours as needed for wheezing.    [name] lisinopril (PRINIVIL,ZESTRIL) 20 MG tablet Take 1 tablet (20 mg total) by mouth daily.  . metoprolol succinate (TOPROL-XL) 25 MG 24 hr tablet Take 0.5 tablets (12.5 mg total) by mouth daily.  . Multiple Vitamins-Minerals (CENTRUM SILVER ADULT 50+ PO) Take 1 capsule by mouth daily.   No current facility-administered medications on file prior to visit.   Lab Results  Component Value Date   TSH 1.591 01/04/2012    Review of Systems Per HPI unless specifically indicated above     Objective:    BP 116/68 mmHg  Pulse 68  Temp(Src) 98.2 F (36.8 C) (Oral)  Wt 200 lb 12 oz (91.06 kg)  Wt Readings from Last 3 Encounters:  06/17/14 200 lb 12 oz (91.06 kg)  06/10/14 199 lb (90.266 kg)  05/30/14 197 lb (89.359 kg)    Physical Exam  Constitutional: He appears well-developed and well-nourished. No distress.  HENT:  Mouth/Throat: Oropharynx is clear and moist. No oropharyngeal exudate.  Eyes: Conjunctivae and EOM are normal. Pupils are equal, round, and reactive to light. No scleral icterus.  Neck: Normal range of motion. Neck supple. No thyromegaly present.  Cardiovascular: Normal rate, regular rhythm, normal heart sounds and intact distal pulses.   No murmur heard. Pulmonary/Chest: Effort normal and breath sounds normal. No respiratory distress. He has no wheezes. He has no rales.  Musculoskeletal: He exhibits no edema.  Lymphadenopathy:    He has no cervical adenopathy.  Skin: Skin is warm and dry. Rash noted.  Rough papular rash on R anterior thigh  and trunk  Psychiatric: He has a normal mood and affect.  Nursing note and vitals reviewed.  Results for orders placed or performed in visit on 06/13/14  CBC with Differential  Result Value Ref Range   WBC 10.2 4.0 - 10.5 K/uL   RBC 5.09 4.22 - 5.81 Mil/uL   Hemoglobin 15.5 13.0 - 17.0 g/dL   HCT 47.2 39.0 - 52.0 %   MCV 92.7 78.0 - 100.0 fl   MCHC 32.8 30.0 - 36.0 g/dL   RDW 14.9 11.5 - 15.5 %   Platelets 263.0 150.0 - 400.0  K/uL   Neutrophils Relative % 67.4 43.0 - 77.0 %   Lymphocytes Relative 21.9 12.0 - 46.0 %   Monocytes Relative 6.8 3.0 - 12.0 %   Eosinophils Relative 3.6 0.0 - 5.0 %   Basophils Relative 0.3 0.0 - 3.0 %   Neutro Abs 6.9 1.4 - 7.7 K/uL   Lymphs Abs 2.2 0.7 - 4.0 K/uL   Monocytes Absolute 0.7 0.1 - 1.0 K/uL   Eosinophils Absolute 0.4 0.0 - 0.7 K/uL   Basophils Absolute 0.0 0.0 - 0.1 K/uL      Assessment & Plan:   Problem List Items Addressed This Visit    Skin rash    Check TSH to r/o thyroid disease dermatitis.    Relevant Orders      T4, free      TSH   Lightheadedness - Primary    Presumed from hypotension however we are at limit to further lower antihypertensive doses 2/2 hypertension during PT. Continue current regimen. I did suggest he check cbg next lightheaded episode.    Relevant Orders      T4, free      TSH   Hypertension    Now stable. Some improvement in fatigue noted with decreased doses.    Ex-smoker    Quit July 2015. Encouraged continued abstinence. Will need to stop lifesavers 2/2 sugar. Suggested sugar free hard candy    Diabetes mellitus type 2, controlled, with complications    Reviewed new dx - currently diet controlled. Reviewed diet changes to keep diabetes under control. Glucose meter sent to pharmacy Discussed importance of regularly scheduled meals     Other Visit Diagnoses    Chest pain, unspecified chest pain type        Relevant Medications       famotidine (PEPCID) tablet        Follow up plan: Return for keep appointment.[name]

## 2014-06-17 NOTE — Assessment & Plan Note (Signed)
Check TSH to r/o thyroid disease dermatitis.

## 2014-06-17 NOTE — Progress Notes (Signed)
Pre visit review using our clinic review tool, if applicable. No additional management support is needed unless otherwise documented below in the visit note. 

## 2014-06-18 ENCOUNTER — Telehealth: Payer: Self-pay | Admitting: Family Medicine

## 2014-06-18 ENCOUNTER — Telehealth: Payer: Self-pay

## 2014-06-18 MED ORDER — ONETOUCH DELICA LANCETS 33G MISC: Status: DC

## 2014-06-18 NOTE — Telephone Encounter (Signed)
emmi emailed °

## 2014-06-18 NOTE — Telephone Encounter (Signed)
Pt left v/m requesting refill lancets to [facility]; spoke with Vanita Panda at [facility] and she advised one touch delica 08Q lancets.

## 2014-06-19 ENCOUNTER — Encounter (HOSPITAL_COMMUNITY): Payer: BC Managed Care – PPO

## 2014-06-20 ENCOUNTER — Ambulatory Visit (INDEPENDENT_AMBULATORY_CARE_PROVIDER_SITE_OTHER): Payer: BC Managed Care – PPO | Admitting: Family Medicine

## 2014-06-20 ENCOUNTER — Telehealth: Payer: Self-pay | Admitting: Family Medicine

## 2014-06-20 ENCOUNTER — Encounter (HOSPITAL_COMMUNITY): Payer: Self-pay | Admitting: Cardiovascular Disease

## 2014-06-20 ENCOUNTER — Telehealth: Payer: Self-pay | Admitting: *Deleted

## 2014-06-20 ENCOUNTER — Ambulatory Visit (INDEPENDENT_AMBULATORY_CARE_PROVIDER_SITE_OTHER)
Admission: RE | Admit: 2014-06-20 | Discharge: 2014-06-20 | Disposition: A | Discharge status: Home / Self Care | Payer: BC Managed Care – PPO | Source: Ambulatory Visit | Attending: Family Medicine | Admitting: Family Medicine

## 2014-06-20 VITALS — BP 108/60 | HR 76 | Temp 98.1°F | Wt 200.2 lb

## 2014-06-20 DIAGNOSIS — R519 Headache, unspecified: Secondary | ICD-10-CM

## 2014-06-20 DIAGNOSIS — R51 Headache: Secondary | ICD-10-CM

## 2014-06-20 DIAGNOSIS — R42 Dizziness and giddiness: Secondary | ICD-10-CM

## 2014-06-20 MED ORDER — OXYCODONE-ACETAMINOPHEN 5-325 MG PO TABS: 1.0000 | ORAL_TABLET | Freq: Three times a day (TID) | ORAL | Status: DC | PRN

## 2014-06-20 MED ORDER — HYDROCODONE-ACETAMINOPHEN 5-325 MG PO TABS: 1.0000 | ORAL_TABLET | Freq: Three times a day (TID) | ORAL | Status: DC | PRN

## 2014-06-20 MED ORDER — CYCLOBENZAPRINE HCL 5 MG PO TABS: 5.0000 mg | ORAL_TABLET | Freq: Three times a day (TID) | ORAL | Status: DC | PRN

## 2014-06-20 NOTE — Telephone Encounter (Signed)
Pt called states he is throwing up and has a really bad headache. He is also really weak. He does not know if this is due to the flu and phenomena vaccine he received the other day. He would like to be see today, if he cannot be worked in he said he would have to go to and UC. Please advise.  [phone]

## 2014-06-20 NOTE — Telephone Encounter (Signed)
lmtcb RE  HOLTER RESULTS  SR PAC'S PVC'S

## 2014-06-20 NOTE — Telephone Encounter (Signed)
Patient scheduled with the understanding to go to UC if worsening prior to appt.

## 2014-06-20 NOTE — Telephone Encounter (Signed)
plz schedule today at 1:15pm. If feeling worse in interim, rec he seek urgent care/ER care.

## 2014-06-20 NOTE — Assessment & Plan Note (Signed)
Ongoing headache for last 2 weeks, associated with dizziness and emesis. In h/o migraines may be recurrence, however current headache is different from prior migraines - more sudden in onset and more persistent.  Pt also endorses possible empty sella syndrome diagnosed at age 20s.  Will obtain head CT w/o contrast to further evaluate acute persistent headache - help r/o space occupying lesion. Provided with flexeril 5mg  prn for abortive migraine therapy, as well as hydrocodone for breakthrough pain

## 2014-06-20 NOTE — Progress Notes (Signed)
BP 108/60 mmHg  Pulse 76  Temp(Src) 98.1 F (36.7 C) (Oral)  Wt 200 lb 4 oz (90.833 kg)   CC: headache, vomiting  Subjective:    Patient ID: Carl Lee, male    DOB: Jun 18, 1956, 58 y.o.   MRN: [id number]  HPI: Carl Lee is a 58 y.o. male presenting on 06/20/2014 for Emesis and Headache   Not feeling well after he was seen here on Monday this week 06/17/2014. At that time, given influenza and pneumovax vaccinations.  Dull nagging headache over last 1-2 weeks. Persistent. Headache described as frontal bilateral constant ache, some throbbing. 10/10 at its worse. Currently 5/10 pain. Had bad headache last night. Took extra aspirin 347m dose at 1am then tramadol at 2am. Trouble sleeping 2/2 headache. Vomited x1 (NBNB) after he woke up at 5:30am. Later vomited again. Took oxycodone x1 and fell back asleep. Best relief from narcotic but feels headache slowly returning. No nausea with emesis. + photo/phonophobia. No aura. + neck stiffness over last 2-3 years, no change and previously attributed to cervical arthritis.  Denies fevers/chills. No significant confusion. No urine incontinence. No inciting trauma/injury. Stays dizzy/lighteaded previously attributed to blood pressure meds.  cbg 89, bp 135/90 this morning during headache.  H/o migraines, that completely resolved after aneurysm surgery.  Has been told has empty sella found on CT scan at age [age]s  Lab Results  Component Value Date   CREATININE 0.8 06/10/2014    Relevant past medical, surgical, family and social history reviewed and updated as indicated. Interim medical history since our last visit reviewed. Allergies and medications reviewed and updated.  Current Outpatient Prescriptions on File Prior to Visit  Medication Sig  . aspirin EC 325 MG EC tablet Take 1 tablet (325 mg total) by mouth daily.  .Marland Kitchenatorvastatin (LIPITOR) 20 MG tablet Take 1 tablet (20 mg total) by mouth daily at 6 PM.  . Blood Glucose Monitoring Suppl  (ONE TOUCH ULTRA SYSTEM KIT) W/DEVICE KIT 1 kit by Does not apply route once.  . DULERA 100-5 MCG/ACT AERO Inhale 2 puffs into the lungs as needed.   . famotidine (PEPCID) 20 MG tablet Take 1 tablet (20 mg total) by mouth 2 (two) times daily.  . Glucosamine-Chondroit-Vit C-Mn (GLUCOSAMINE 1500 COMPLEX PO) Take 2 tablets by mouth daily.  .Marland Kitchenglucose blood test strip check once daily and as needed  . levalbuterol (XOPENEX HFA) 45 MCG/ACT inhaler Inhale 1-2 puffs into the lungs every 6 (six) hours as needed for wheezing.  .Marland Kitchenlisinopril (PRINIVIL,ZESTRIL) 20 MG tablet Take 1 tablet (20 mg total) by mouth daily.  . metoprolol succinate (TOPROL-XL) 25 MG 24 hr tablet Take 0.5 tablets (12.5 mg total) by mouth daily.  . Multiple Vitamins-Minerals (CENTRUM SILVER ADULT 50+ PO) Take 1 capsule by mouth daily.  .Glory RosebushDELICA LANCETS 350PMISC Check blood sugar once a day and as directed. Dx. E11.8   No current facility-administered medications on file prior to visit.   Past Medical History  Diagnosis Date  . [place]adder [place]/16/[place]    [place]  . Hypertension   . AAA (abdominal aortic aneurysm) 03/25/12    s/p endovascular repair  . Hx of migraines   . Contact dermatitis     atypical ([name]  . Childhood asthma   . Environmental allergies     improved as ages  . Diverticulosis 2013    severe by CT and colonoscopy  . Adenomatous polyp 06/2009  . Lumbosacral  radiculopathy at S1 03/2012    left, with spinal stenosis (MRI 04/2013) improved with TF ESI L5/S1 and S1/2 ([facility] [place])  . Spinal stenosis     LS1 nerve root impingement from bulging disc  . Coronary artery disease   . Zenker diverticulum   . Resistance to clopidogrel 2015    drug metabolism panel run - asked to scan    Past Surgical History  Procedure Laterality Date  . Tonsillectomy and adenoidectomy  1973  . Vasectomy    . Bladder cancer      ([place]) x 2  . Abdominal aortic aneurysm repair   03/25/12  . Colonoscopy  06/2012    hyperplastic polyp, diverticulosis ([facility]) rec rpt 5 yrs  . Cystoscopy  08/16/08    Bladder Cancer  . Esi  04/2013, 06/2013    L L5S1, S12 transforaminal ESI (Dr.  [name])  . Abi  05/2013    WNL, L TBI low at 0.66  . Coronary artery bypass graft N/A 02/07/2014    Procedure: CORONARY ARTERY BYPASS GRAFTING (CABG);  Surgeon: [name], MD;  Location: [facility];  Service: Open Heart Surgery;  Laterality: N/A;  CABG X 3, BILATERAL LIMA, EVH  . Intraoperative transesophageal echocardiogram N/A 02/07/2014    Procedure: INTRAOPERATIVE TRANSESOPHAGEAL ECHOCARDIOGRAM;  Surgeon: [name], MD;  Location: [facility];  Service: Open Heart Surgery;  Laterality: N/A;  . Esi Left 04/2014, 05/2014    L5/S1; S1/2, rpt  . Left heart catheterization with coronary angiogram N/A 02/01/2014    Procedure: LEFT HEART CATHETERIZATION WITH CORONARY ANGIOGRAM;  Surgeon: [name], MD;  Location[facility] C[facility];  Service: Cardiovascular;  Laterality: N/A;   Review of Systems Per HPI unless specifically indicated above     Objective:    BP 108/60 mmHg  Pulse 76  Temp(Src) 98.1 F (36.7 C) (Oral)  Wt 200 lb 4 oz (90.833 kg)  Wt Readings from Last 3 Encounters:  06/20/14 200 lb 4 oz (90.833 kg)  06/17/14 200 lb 12 oz (91.06 kg)  06/10/14 199 lb (90.266 kg)    Physical Exam  Constitutional: He is oriented to person, place, and time. He appears well-developed and well-nourished. No distress.  HENT:  Head: Normocephalic and atraumatic.  Mouth/Throat: Oropharynx is clear and moist. No oropharyngeal exudate.  Eyes: Conjunctivae and EOM are normal. Pupils are equal, round, and reactive to light. No scleral icterus.  Neck: Normal range of motion. Neck supple.  Musculoskeletal:  FROM neck, tight/tender at left trapezius  Neurological: He is alert and oriented to person, place, and time. No cranial nerve deficit or sensory deficit. He exhibits normal  muscle tone. Coordination normal.  CN 2-12 intact Slightly diminished speed FTN testing bilaterally Station and gait intact  Psychiatric: He has a normal mood and affect.  Nursing note and vitals reviewed.  Results for orders placed or performed in visit on 06/17/14  T4, free  Result Value Ref Range   Free T4 0.92 0.60 - 1.60 ng/dL  TSH  Result Value Ref Range   TSH 1.50 0.35 - 4.50 uIU/mL      Assessment & Plan:   Problem List Items Addressed This Visit    Lightheadedness   Relevant Orders      CT Head Wo Contrast   Acute intractable headache - Primary    Ongoing headache for last 2 weeks, associated with dizziness and emesis. In h/o migraines may be recurrence, however current headache is different from prior migraines -  more sudden in onset and more persistent.  Pt also endorses possible empty sella syndrome diagnosed at age 83s.  Will obtain head CT w/o contrast to further evaluate acute persistent headache - help r/o space occupying lesion. Provided with flexeril 16m prn for abortive migraine therapy, as well as hydrocodone for breakthrough pain    Relevant Medications      cyclobenzaprine (FLEXERIL) tablet      HYDROcodone-acetaminophen (NORCO/VICODIN) 5-325 MG per tablet   Other Relevant Orders      CT Head Wo Contrast       Follow up plan: Return if symptoms worsen or fail to improve.

## 2014-06-20 NOTE — Progress Notes (Signed)
Pre visit review using our clinic review tool, if applicable. No additional management support is needed unless otherwise documented below in the visit note. 

## 2014-06-20 NOTE — Patient Instructions (Addendum)
CT scan - pass by Marion's office. This may be migraine - treat with flexeril 5mg  1/2 to 1 tablet as needed at onset of migraine. Call us if headache returning despite flexeril. Stop tramadol. Ok to use hydrocodone sparingly for headache. If severe or any fever, please seek urgent care. Push fluids and rest.

## 2014-06-21 ENCOUNTER — Encounter: Payer: Self-pay | Admitting: Family Medicine

## 2014-06-21 ENCOUNTER — Other Ambulatory Visit: Payer: Self-pay | Admitting: Family Medicine

## 2014-06-21 ENCOUNTER — Ambulatory Visit (INDEPENDENT_AMBULATORY_CARE_PROVIDER_SITE_OTHER): Payer: BC Managed Care – PPO | Admitting: Family Medicine

## 2014-06-21 ENCOUNTER — Encounter (HOSPITAL_COMMUNITY)
Admission: RE | Admit: 2014-06-21 | Discharge: 2014-06-21 | Disposition: A | Discharge status: Home / Self Care | Payer: BC Managed Care – PPO | Source: Ambulatory Visit | Attending: Cardiovascular Disease | Admitting: Cardiovascular Disease

## 2014-06-21 VITALS — BP 122/76 | HR 96 | Temp 98.1°F | Ht 71.0 in | Wt 199.5 lb

## 2014-06-21 DIAGNOSIS — E118 Type 2 diabetes mellitus with unspecified complications: Secondary | ICD-10-CM

## 2014-06-21 DIAGNOSIS — E785 Hyperlipidemia, unspecified: Secondary | ICD-10-CM

## 2014-06-21 DIAGNOSIS — R42 Dizziness and giddiness: Secondary | ICD-10-CM

## 2014-06-21 DIAGNOSIS — Z5189 Encounter for other specified aftercare: Secondary | ICD-10-CM | POA: Diagnosis not present

## 2014-06-21 DIAGNOSIS — R51 Headache: Secondary | ICD-10-CM

## 2014-06-21 DIAGNOSIS — Z0000 Encounter for general adult medical examination without abnormal findings: Secondary | ICD-10-CM

## 2014-06-21 DIAGNOSIS — R5382 Chronic fatigue, unspecified: Secondary | ICD-10-CM

## 2014-06-21 DIAGNOSIS — I10 Essential (primary) hypertension: Secondary | ICD-10-CM

## 2014-06-21 DIAGNOSIS — R519 Headache, unspecified: Secondary | ICD-10-CM

## 2014-06-21 LAB — VITAMIN B12: Vitamin B-12: 274 pg/mL (ref 211–911)

## 2014-06-21 LAB — FOLATE: Folate: >24.6 ng/mL (ref >5.9)

## 2014-06-21 MED ORDER — VITAMIN B-12 1000 MCG PO TABS: 1000.0000 ug | ORAL_TABLET | Freq: Every day | ORAL | Status: DC

## 2014-06-21 NOTE — Assessment & Plan Note (Signed)
Chronic, stable 

## 2014-06-21 NOTE — Addendum Note (Signed)
Addended by: [name] on: 06/21/2014 12:38 PM   Modules accepted: Level of Service, SmartSet

## 2014-06-21 NOTE — Assessment & Plan Note (Signed)
Handout provided to patient on diabetic diet per his request.

## 2014-06-21 NOTE — Progress Notes (Addendum)
BP 122/76 mmHg  Pulse 96  Temp(Src) 98.1 F (36.7 C) (Oral)  Ht _0  (1.803 m)  Wt 199 lb 8 oz (90.493 kg)  BMI 27.84 kg/m2   CC: CPE  Subjective:    Patient ID: Carl Lee, male    DOB: 06-09-1956, 58 y.o.   MRN: [id number]  HPI: KNOX CERVI is a 58 y.o. male presenting on 06/21/2014 for Annual Exam   Complicated past year - see prior notes for details. Bladder cancer surgery.   Recent headache with malaise, dizziness, emesis - CT head yesterday unrevealing. Anticipate migraine related. Has not tried flexeril or hydrocodone. Had cardiac rehab this morning. Stays lightheaded and imbalanced. denies foot paresthesias.   Preventative: Colonoscopy 06/2012 hyperplastic polyp, diverticulosis Ardis [facility]) rec rpt 5 yrs Prostate cancer screening - no fmhx. checked by Uro. Flu shot - 06/17/2014 Tdap 12/2012 Pneumovax 06/2014 Seat belt use discussed Sunscreen use discussed, no changing moles  Caffeine: 1 cup coffee/day  Lives with wife, 1 dog  grown children  Occupation: Therapist, sports - self employed  Edu: 2 yrs college  Activity: fishing, walking occasionally  Diet: good water, fruits/vegetables daily  Relevant past medical, surgical, family and social history reviewed and updated as indicated. Interim medical history since our last visit reviewed. Allergies and medications reviewed and updated.  Current Outpatient Prescriptions on File Prior to Visit  Medication Sig  . aspirin EC 325 MG EC tablet Take 1 tablet (325 mg total) by mouth daily.  [name] atorvastatin (LIPITOR) 20 MG tablet Take 1 tablet (20 mg total) by mouth daily at 6 PM.  . Blood Glucose Monitoring Suppl (ONE TOUCH ULTRA SYSTEM KIT) W/DEVICE KIT 1 kit by Does not apply route once.  . cyclobenzaprine (FLEXERIL) 5 MG tablet Take 1 tablet (5 mg total) by mouth 3 (three) times daily as needed (migraine).  . DULERA 100-5 MCG/ACT AERO Inhale 2 puffs into the lungs as needed.   . famotidine (PEPCID) 20 MG  tablet Take 1 tablet (20 mg total) by mouth 2 (two) times daily.  . Glucosamine-Chondroit-Vit C-Mn (GLUCOSAMINE 1500 COMPLEX PO) Take 2 tablets by mouth daily.  [name] glucose blood test strip check once daily and as needed  . HYDROcodone-acetaminophen (NORCO/VICODIN) 5-325 MG per tablet Take 1 tablet by mouth every 8 (eight) hours as needed for severe pain.  [name] levalbuterol (XOPENEX HFA) 45 MCG/ACT inhaler Inhale 1-2 puffs into the lungs every 6 (six) hours as needed for wheezing.  [name] lisinopril (PRINIVIL,ZESTRIL) 20 MG tablet Take 1 tablet (20 mg total) by mouth daily.  . metoprolol succinate (TOPROL-XL) 25 MG 24 hr tablet Take 0.5 tablets (12.5 mg total) by mouth daily.  . Multiple Vitamins-Minerals (CENTRUM SILVER ADULT 50+ PO) Take 1 capsule by mouth daily.  Glory Rosebush DELICA LANCETS 92H MISC Check blood sugar once a day and as directed. Dx. E11.8   No current facility-administered medications on file prior to visit.    Review of Systems  Constitutional: Positive for appetite change (decreased). Negative for fever, chills, activity change, fatigue and unexpected weight change.  HENT: Negative for hearing loss.   Eyes: Negative for visual disturbance.  Respiratory: Negative for cough, chest tightness, shortness of breath and wheezing.   Cardiovascular: Positive for chest pain. Negative for palpitations and leg swelling.  Gastrointestinal: Negative for nausea, vomiting, abdominal pain, diarrhea, constipation, blood in stool and abdominal distention.  Genitourinary: Negative for hematuria and difficulty urinating.  Musculoskeletal: Negative for myalgias, arthralgias and neck pain.  Skin: Negative  for rash.  Neurological: Positive for dizziness, tremors, light-headedness and headaches. Negative for seizures, syncope and numbness.  Hematological: Negative for adenopathy. Does not bruise/bleed easily.  Psychiatric/Behavioral: Negative for dysphoric mood. The patient is not nervous/anxious.    Per  HPI unless specifically indicated above     Objective:    BP 122/76 mmHg  Pulse 96  Temp(Src) 98.1 F (36.7 C) (Oral)  Ht $R'5\' 11"'OO$  (1.803 m)  Wt 199 lb 8 oz (90.493 kg)  BMI 27.84 kg/m2  Wt Readings from Last 3 Encounters:  06/21/14 199 lb 8 oz (90.493 kg)  06/20/14 200 lb 4 oz (90.833 kg)  06/17/14 200 lb 12 oz (91.06 kg)    Physical Exam  Constitutional: He is oriented to person, place, and time. He appears well-developed and well-nourished. No distress.  HENT:  Head: Normocephalic and atraumatic.  Right Ear: Hearing, tympanic membrane, external ear and ear canal normal.  Left Ear: Hearing, tympanic membrane, external ear and ear canal normal.  Nose: Nose normal.  Mouth/Throat: Uvula is midline, oropharynx is clear and moist and mucous membranes are normal. No oropharyngeal exudate, posterior oropharyngeal edema or posterior oropharyngeal erythema.  Eyes: Conjunctivae and EOM are normal. Pupils are equal, round, and reactive to light. No scleral icterus.  Neck: Normal range of motion. Neck supple. No thyromegaly present.  Cardiovascular: Normal rate, regular rhythm, normal heart sounds and intact distal pulses.   No murmur heard. Pulses:      Radial pulses are 2+ on the right side, and 2+ on the left side.  Pulmonary/Chest: Effort normal and breath sounds normal. No respiratory distress. He has no wheezes. He has no rales.  Abdominal: Soft. Bowel sounds are normal. He exhibits no distension and no mass. There is no tenderness. There is no rebound and no guarding.  Genitourinary:  GU per urology  Musculoskeletal: Normal range of motion. He exhibits no edema.  Lymphadenopathy:    He has no cervical adenopathy.  Neurological: He is alert and oriented to person, place, and time.  CN grossly intact, station and gait intact  Skin: Skin is warm and dry. No rash noted.  Psychiatric: He has a normal mood and affect. His behavior is normal. Judgment and thought content normal.    Nursing note and vitals reviewed.  Results for orders placed or performed in visit on 06/17/14  T4, free  Result Value Ref Range   Free T4 0.92 0.60 - 1.60 ng/dL  TSH  Result Value Ref Range   TSH 1.50 0.35 - 4.50 uIU/mL   Lab Results  Component Value Date   HGBA1C 6.8* 06/13/2014      Assessment & Plan:   Problem List Items Addressed This Visit    Lightheadedness    Longstanding associated with fatigue and dysequilibrium but denies paresthesias, anticipate multifactorial. Started after 3v CABG Check B12 and folate today.    Relevant Orders      Vitamin B12      Folate   Hypertension    Chronic, stable.    Healthcare maintenance - Primary    Preventative protocols reviewed and updated unless pt declined. Discussed healthy diet and lifestyle.     Headache    Improving. Reassuring head CT yesterday. Try flexeril abortively for presumed migraine.    Dyslipidemia    Reviewed labwork with patient. Continue statin.    Diabetes mellitus type 2, controlled, with complications    Handout provided to patient on diabetic diet per his request.     Other  Visit Diagnoses    Chronic fatigue        Relevant Orders       Vitamin B12       Folate        Follow up plan: Return in about 3 months (around 09/20/2014), or as needed, for follow up visit.

## 2014-06-21 NOTE — Assessment & Plan Note (Signed)
Reviewed labwork with patient. Continue statin.

## 2014-06-21 NOTE — Patient Instructions (Addendum)
Consider using cane.  Consider vit b12 and vit D. Check levels today. Good to see you today, call us with questions. Return as needed or in 3 months for follow up  Diabetes Mellitus and Food It is important for you to manage your blood sugar (glucose) level. Your blood glucose level can be greatly affected by what you eat. Eating healthier foods in the appropriate amounts throughout the day at about the same time each day will help you control your blood glucose level. It can also help slow or prevent worsening of your diabetes mellitus. Healthy eating may even help you improve the level of your blood pressure and reach or maintain a healthy weight.  HOW CAN FOOD AFFECT ME? Carbohydrates Carbohydrates affect your blood glucose level more than any other type of food. Your dietitian will help you determine how many carbohydrates to eat at each meal and teach you how to count carbohydrates. Counting carbohydrates is important to keep your blood glucose at a healthy level, especially if you are using insulin or taking certain medicines for diabetes mellitus. Alcohol Alcohol can cause sudden decreases in blood glucose (hypoglycemia), especially if you use insulin or take certain medicines for diabetes mellitus. Hypoglycemia can be a life-threatening condition. Symptoms of hypoglycemia (sleepiness, dizziness, and disorientation) are similar to symptoms of having too much alcohol.  If your health care provider has given you approval to drink alcohol, do so in moderation and use the following guidelines:  Women should not have more than one drink per day, and men should not have more than two drinks per day. One drink is equal to:  12 oz of beer.  5 oz of wine.  1 oz of hard liquor.  Do not drink on an empty stomach.  Keep yourself hydrated. Have water, diet soda, or unsweetened iced tea.  Regular soda, juice, and other mixers might contain a lot of carbohydrates and should be counted. WHAT  FOODS ARE NOT RECOMMENDED? As you make food choices, it is important to remember that all foods are not the same. Some foods have fewer nutrients per serving than other foods, even though they might have the same number of calories or carbohydrates. It is difficult to get your body what it needs when you eat foods with fewer nutrients. Examples of foods that you should avoid that are high in calories and carbohydrates but low in nutrients include:  Trans fats (most processed foods list trans fats on the Nutrition Facts label).  Regular soda.  Juice.  Candy.  Sweets, such as cake, pie, doughnuts, and cookies.  Fried foods. WHAT FOODS CAN I EAT? Have nutrient-rich foods, which will nourish your body and keep you healthy. The food you should eat also will depend on several factors, including:  The calories you need.  The medicines you take.  Your weight.  Your blood glucose level.  Your blood pressure level.  Your cholesterol level. You also should eat a variety of foods, including:  Protein, such as meat, poultry, fish, tofu, nuts, and seeds (lean animal proteins are best).  Fruits.  Vegetables.  Dairy products, such as milk, cheese, and yogurt (low fat is best).  Breads, grains, pasta, cereal, rice, and beans.  Fats such as olive oil, trans fat-free margarine, canola oil, avocado, and olives. DOES EVERYONE WITH DIABETES MELLITUS HAVE THE SAME MEAL PLAN? Because every person with diabetes mellitus is different, there is not one meal plan that works for everyone. It is very important that you meet  with a dietitian who will help you create a meal plan that is just right for you. Document Released: 03/25/2005 Document Revised: 07/03/2013 Document Reviewed: 05/25/2013 [facility] Patient Information 2015 [place], [place]. This information is not intended to replace advice given to you by your health care provider. Make sure you discuss any questions you have with your health care  provider.

## 2014-06-21 NOTE — Assessment & Plan Note (Signed)
Improving. Reassuring head CT yesterday. Try flexeril abortively for presumed migraine.

## 2014-06-21 NOTE — Assessment & Plan Note (Signed)
Preventative protocols reviewed and updated unless pt declined. Discussed healthy diet and lifestyle.  

## 2014-06-21 NOTE — Progress Notes (Signed)
Pre visit review using our clinic review tool, if applicable. No additional management support is needed unless otherwise documented below in the visit note. 

## 2014-06-21 NOTE — Assessment & Plan Note (Signed)
Longstanding associated with fatigue and dysequilibrium but denies paresthesias, anticipate multifactorial. Started after 3v CABG Check B12 and folate today.

## 2014-06-24 ENCOUNTER — Encounter (HOSPITAL_COMMUNITY)
Admission: RE | Admit: 2014-06-24 | Discharge: 2014-06-24 | Disposition: A | Discharge status: Home / Self Care | Payer: BC Managed Care – PPO | Source: Ambulatory Visit | Attending: Cardiovascular Disease | Admitting: Cardiovascular Disease

## 2014-06-24 DIAGNOSIS — Z5189 Encounter for other specified aftercare: Secondary | ICD-10-CM | POA: Diagnosis not present

## 2014-06-26 ENCOUNTER — Telehealth: Payer: Self-pay | Admitting: *Deleted

## 2014-06-26 ENCOUNTER — Encounter (HOSPITAL_COMMUNITY)
Admission: RE | Admit: 2014-06-26 | Discharge: 2014-06-26 | Disposition: A | Discharge status: Home / Self Care | Payer: BC Managed Care – PPO | Source: Ambulatory Visit | Attending: Cardiovascular Disease | Admitting: Cardiovascular Disease

## 2014-06-26 DIAGNOSIS — Z5189 Encounter for other specified aftercare: Secondary | ICD-10-CM | POA: Diagnosis not present

## 2014-06-26 NOTE — Telephone Encounter (Signed)
PT'S WIFE  AWARE OF  MONITOR RESULTS .Carl Lee

## 2014-06-26 NOTE — Telephone Encounter (Signed)
PT  CALLED  IS  AWARE OF  MONITOR  RESULTS  ALSO   IS COMPLAINING   WITH  FATIGUE  LIGHTHEADEDNESS  AND  B/P  OF  170/105  WHILE  EXERCISING AT [facility]

## 2014-06-28 ENCOUNTER — Telehealth: Payer: Self-pay | Admitting: Cardiovascular Disease

## 2014-06-28 ENCOUNTER — Telehealth: Payer: Self-pay | Admitting: Family Medicine

## 2014-06-28 ENCOUNTER — Encounter: Payer: Self-pay | Admitting: Family Medicine

## 2014-06-28 ENCOUNTER — Encounter (HOSPITAL_COMMUNITY)
Admission: RE | Admit: 2014-06-28 | Discharge: 2014-06-28 | Disposition: A | Discharge status: Home / Self Care | Payer: BC Managed Care – PPO | Source: Ambulatory Visit | Attending: Cardiovascular Disease | Admitting: Cardiovascular Disease

## 2014-06-28 ENCOUNTER — Ambulatory Visit (INDEPENDENT_AMBULATORY_CARE_PROVIDER_SITE_OTHER): Payer: BC Managed Care – PPO | Admitting: Family Medicine

## 2014-06-28 VITALS — BP 144/82 | HR 86 | Temp 98.2°F | Ht 71.0 in | Wt 199.8 lb

## 2014-06-28 DIAGNOSIS — Z951 Presence of aortocoronary bypass graft: Secondary | ICD-10-CM

## 2014-06-28 DIAGNOSIS — R42 Dizziness and giddiness: Secondary | ICD-10-CM

## 2014-06-28 DIAGNOSIS — Z5189 Encounter for other specified aftercare: Secondary | ICD-10-CM | POA: Diagnosis not present

## 2014-06-28 DIAGNOSIS — R519 Headache, unspecified: Secondary | ICD-10-CM

## 2014-06-28 DIAGNOSIS — E538 Deficiency of other specified B group vitamins: Secondary | ICD-10-CM

## 2014-06-28 DIAGNOSIS — R51 Headache: Secondary | ICD-10-CM

## 2014-06-28 DIAGNOSIS — I10 Essential (primary) hypertension: Secondary | ICD-10-CM

## 2014-06-28 LAB — VITAMIN B12: VITAMIN B 12: 324 pg/mL (ref 211–911)

## 2014-06-28 MED ORDER — CYANOCOBALAMIN 1000 MCG/ML IJ SOLN
1000.0000 ug | Freq: Once | INTRAMUSCULAR | Status: AC
  Administered 2014-06-28: 1000 ug via INTRAMUSCULAR

## 2014-06-28 NOTE — Telephone Encounter (Signed)
May schedule today at 1:30pm. Alternatively we can set him up with neurology to eval persistent dizziness but may be a few weeks before we can get him in with them.

## 2014-06-28 NOTE — Telephone Encounter (Signed)
Patient called and said he was at Cardiac Rehab and his blood pressure went up to 175/110 and when he was resting it was 135/90. Patient says he's lightheaded,shaky and he has a headache. Patient left a message for Dr.[name] about his blood pressure medication, but said he would call our office to see if he could get an appointment with Dr.Gutierrez.  Our schedule is full.  Please advise.

## 2014-06-28 NOTE — Patient Instructions (Addendum)
Blood work today. b12 shot today.  I will check with Dr [name] about adding hydrochlorothiazide 12.5mg  to your regimen.  If persistent imbalance, let us know for referral to neurologist.

## 2014-06-28 NOTE — Telephone Encounter (Signed)
DISCUSSED WITH DR [name] PT  NEEDS  TO  F/U  WITH PMD  PER DR [name]./CY   LEFT VOICE MAIL THAT  PT  NEEDS TO  F/U  WITH  PMD  PER  DR [name]./CY

## 2014-06-28 NOTE — Progress Notes (Signed)
Ok to start HCTZ during day but I really don't think his dizzyness is related to BP or heart His BP always seems fine when at our office and at rest and would not be too aggressive  At lowering it

## 2014-06-28 NOTE — Progress Notes (Signed)
BP 144/82 mmHg  Pulse 86  Temp(Src) 98.2 F (36.8 C) (Oral)  Ht _0  (1.803 m)  Wt 199 lb 12.8 oz (90.629 kg)  BMI 27.88 kg/m2  SpO2 98%   CC: blood pressure concerns  Subjective:    Patient ID: [name], male    DOB: 1955-07-19, 58 y.o.   MRN: [id number]  HPI: DESIREE FLEMING is a 58 y.o. male presenting on 06/28/2014 for No chief complaint on file.   See recent phone note and recent office notes - persistent dizziness described as imbalance and lightheadedness. This week at cardiac rehab bp up to 175/110 (Wed) after using stepper machine, at rest 135/90. Unable to use stationary bicycle. Feels imbalance, lightheaded, shaky, headache with these elevated readings. Lightheadedness started after 3v CABG 01/2014. Persistent fatigue. At home am blood pressures running 135-140/90s. Only has 3 wks of therapy left, has been told he cannot exercise when BP >160/90. Denies paresthesias.  Recent head CT obtained for intractable headache with nausea and dizziness was overall unrevealing. HA thought migraine related. Flexeril was started for migraine abortive therapy which has helped.   Pt getting frustrated with fluctuating blood pressures esp during cardiac rehab. Currently takes lisinopril 48m daily in am and toprol xl 12.590mat night time. hctz 12.78m94mas discontinued 06/10/2014, he has noted several pound weight gain since then. bp has been creeping up over last several weeks.  Denies vision changes, SOB, leg swelling. Recent B12 level - 274 (06/21/2014). Has not started B12 tablets yet. BP Readings from Last 3 Encounters:  06/28/14 144/82  06/21/14 122/76  06/20/14 108/60    Relevant past medical, surgical, family and social history reviewed and updated as indicated. Interim medical history since our last visit reviewed. Allergies and medications reviewed and updated. Current Outpatient Prescriptions on File Prior to Visit  Medication Sig  . aspirin EC 325 MG EC tablet Take 1  tablet (325 mg total) by mouth daily.  . aMarland Kitchenorvastatin (LIPITOR) 20 MG tablet Take 1 tablet (20 mg total) by mouth daily at 6 PM.  . Blood Glucose Monitoring Suppl (ONE TOUCH ULTRA SYSTEM KIT) W/DEVICE KIT 1 kit by Does not apply route once.  . cyclobenzaprine (FLEXERIL) 5 MG tablet Take 1 tablet (5 mg total) by mouth 3 (three) times daily as needed (migraine).  . famotidine (PEPCID) 20 MG tablet Take 1 tablet (20 mg total) by mouth 2 (two) times daily.  . Glucosamine-Chondroit-Vit C-Mn (GLUCOSAMINE 1500 COMPLEX PO) Take 2 tablets by mouth daily.  . gMarland Kitchenucose blood test strip check once daily and as needed  . lisinopril (PRINIVIL,ZESTRIL) 20 MG tablet Take 1 tablet (20 mg total) by mouth daily.  . metoprolol succinate (TOPROL-XL) 25 MG 24 hr tablet Take 0.5 tablets (12.5 mg total) by mouth daily.  . Multiple Vitamins-Minerals (CENTRUM SILVER ADULT 50+ PO) Take 1 capsule by mouth daily.  . OGlory RosebushLICA LANCETS [id number] Check blood sugar once a day and as directed. Dx. E11.8  . DULERA 100-5 MCG/ACT AERO Inhale 2 puffs into the lungs as needed.   . HMarland KitchenDROcodone-acetaminophen (NORCO/VICODIN) 5-325 MG per tablet Take 1 tablet by mouth every 8 (eight) hours as needed for severe pain. (Patient not taking: Reported on 06/28/2014)  . levalbuterol (XOPENEX HFA) 45 MCG/ACT inhaler Inhale 1-2 puffs into the lungs every 6 (six) hours as needed for wheezing. (Patient not taking: Reported on 06/28/2014)  . vitamin B-12 (CYANOCOBALAMIN) 1000 MCG tablet Take 1 tablet (1,000 mcg total) by mouth daily. (Patient  not taking: Reported on 06/28/2014)   No current facility-administered medications on file prior to visit.   Past Medical History  Diagnosis Date  . Bladder cancer 08/16/2008    [facility] [place] now [place])  . Hypertension   . AAA (abdominal aortic aneurysm) 03/25/12    s/p endovascular repair  . Hx of migraines   . Contact dermatitis     atypical [name])  . Childhood asthma   . Environmental  allergies     improved as ages  . Diverticulosis 2013    severe by CT and colonoscopy  . Adenomatous polyp 06/2009  . Lumbosacral radiculopathy at S1 03/2012    left, with spinal stenosis (MRI 04/2013) improved with TF ESI L5/S1 and S1/2 ([facility])  . Spinal stenosis     LS1 nerve root impingement from bulging disc  . Coronary artery disease   . [place]ker diverticulum   . Resistance to clopidogrel 2015    drug metabolism panel run - asked to scan    Past Surgical History  Procedure Laterality Date  . Tonsillectomy and adenoidectomy  1973  . Vasectomy    . Bladder cancer      ([place]) x 2  . Abdominal aortic aneurysm repair  03/25/12  . Colonoscopy  06/2012    hyperplastic polyp, diverticulosis ([facility]) rec rpt 5 yrs  . Cystoscopy  08/16/08    Bladder Cancer  . Esi  04/2013, 06/2013    L L5S1, S12 transforaminal ESI (Dr.  [name])  . Abi  05/2013    WNL, L TBI low at 0.66  . Coronary artery bypass graft N/A 02/07/2014    Procedure: CORONARY ARTERY BYPASS GRAFTING (CABG);  Surgeon: [name], MD;  Location: [facility];  Service: Open Heart Surgery;  Laterality: N/A;  CABG X 3, BILATERAL LIMA, EVH  . Intraoperative transesophageal echocardiogram N/A 02/07/2014    Procedure: INTRAOPERATIVE TRANSESOPHAGEAL ECHOCARDIOGRAM;  Surgeon: [name], MD;  Location: [facility];  Service: Open Heart Surgery;  Laterality: N/A;  . Esi Left 04/2014, 05/2014    L5/S1; S1/2, rpt  . Left heart catheterization with coronary angiogram N/A 02/01/2014    Procedure: LEFT HEART CATHETERIZATION WITH CORONARY ANGIOGRAM;  Surgeon: [name], MD;  Location: [facility] CATH LAB;  Service: Cardiovascular;  Laterality: N/A;   Review of Systems Per HPI unless specifically indicated above     Objective:    BP 144/82 mmHg  Pulse 86  Temp(Src) 98.2 F (36.8 C) (Oral)  Ht _0  (1.803 m)  Wt 199 lb 12.8 oz (90.629 kg)  BMI 27.88 kg/m2  SpO2 98%  Wt Readings from Last 3 Encounters:    06/28/14 199 lb 12.8 oz (90.629 kg)  06/21/14 199 lb 8 oz (90.493 kg)  06/20/14 200 lb 4 oz (90.833 kg)    Physical Exam  Constitutional: He appears well-developed and well-nourished. No distress.  Cardiovascular: Normal rate, regular rhythm, normal heart sounds and intact distal pulses.   No murmur heard. Pulmonary/Chest: Effort normal and breath sounds normal. No respiratory distress. He has no wheezes. He has no rales.  Musculoskeletal: He exhibits no edema.  Psychiatric: He has a normal mood and affect.  Nursing note and vitals reviewed.  Results for orders placed or performed in visit on 06/21/14  Vitamin B12  Result Value Ref Range   Vitamin B-12 274 211 - 911 pg/mL  Folate  Result Value Ref Range   Folate >24.6 >5.9 ng/mL      Assessment &  Plan:   Problem List Items Addressed This Visit    S/P CABG x 3   Lightheadedness    Persistent lightheadedness with imbalance. b12 low normal last check - will recheck today along with confirmatory testing MMA/HC. B12 1063mg IM today. Discussed possible referral to neurology for further evaluation, pt will let uKoreaknow if not improving with B12 supplementation.    Hypertension - Primary    Main issue is elevated blood pressure readings throughout day as well as high readings during cardiac rehab to 170s/110s - unable to exercise as he would like during rehab. Fatigue improved initially with decrease in toprol xl dose several months ago but still persists. I do think we now need additional blood pressure coverage during the day and have suggested he restart hctz 12.559monce daily in am. I will also send today's note to Dr NiJohnsie Cancelo ensure he agrees with this change.     Relevant Medications      hydrochlorothiazide (MICROZIDE) 12.5 MG capsule   Headache    ?related to artificial sweetener which they have now stopped     Other Visit Diagnoses    Low vitamin B12 level        Relevant Orders       Vitamin B12       Methylmalonic  Acid, Serum       Homocysteine        Follow up plan: Return if symptoms worsen or fail to improve.

## 2014-06-28 NOTE — Telephone Encounter (Signed)
[place] was in [place] and will turn around and come back for an appt at 1:30

## 2014-06-28 NOTE — Telephone Encounter (Signed)
FYI PT STATES LAST TIME HE WAS HERE DR [name] TOLD HIM HE WANTED TO BE THE ONE IN CHARGE OF CHANGING PT'S BP MEDS, SO IF THIS IS BP RELATED AND MEDS NEED TO BE CHANGED, DR [name] NEEDS TO DO IT, BUT IN THE MEANTIME WILL CALL PCP AND SEE IF HE CAN MAKE AN APPT, PLS ADVISE

## 2014-06-28 NOTE — Assessment & Plan Note (Signed)
?  related to artificial sweetener which they have now stopped

## 2014-06-28 NOTE — Addendum Note (Signed)
Addended by: Marchia Bond on: 06/28/2014 03:37 PM   Modules accepted: Orders

## 2014-06-28 NOTE — Assessment & Plan Note (Addendum)
Main issue is elevated blood pressure readings throughout day as well as high readings during cardiac rehab to 170s/110s - unable to exercise as he would like during rehab. Fatigue improved initially with decrease in toprol xl dose several months ago but still persists. I do think we now need additional blood pressure coverage during the day and have suggested he restart hctz 12.5mg  once daily in am. I will also send today's note to Dr [name] to ensure he agrees with this change.

## 2014-06-28 NOTE — Telephone Encounter (Signed)
New problem:    Please ask for Joann RN when calling.  Rn is calling about pt's  BP.

## 2014-06-28 NOTE — Assessment & Plan Note (Signed)
Persistent lightheadedness with imbalance. b12 low normal last check - will recheck today along with confirmatory testing MMA/HC. B12 1060mcg IM today. Discussed possible referral to neurology for further evaluation, pt will let us know if not improving with B12 supplementation.

## 2014-06-28 NOTE — Addendum Note (Signed)
Addended by: Lurlean Nanny on: 06/28/2014 04:32 PM   Modules accepted: Orders

## 2014-06-28 NOTE — Telephone Encounter (Signed)
LEFT VOICE  MAIL FOR  JOANN THAT   WE SPOKE  WITH PT   RE  S/S  AND PER  DR [name] NEEDS TO   F/U PMD .Adonis Housekeeper

## 2014-06-28 NOTE — Telephone Encounter (Signed)
Carl Lee with cardiac rehab  rtn call to christine pls call [phone], have them interrupt her if she is busy

## 2014-06-28 NOTE — Progress Notes (Signed)
Pre visit review using our clinic review tool, if applicable. No additional management support is needed unless otherwise documented below in the visit note. 

## 2014-06-29 ENCOUNTER — Telehealth: Payer: Self-pay | Admitting: Family Medicine

## 2014-06-29 LAB — HOMOCYSTEINE: Homocysteine: 9.6 umol/L (ref 4.0–15.4)

## 2014-06-29 NOTE — Telephone Encounter (Signed)
plz notify we touched base with cardiology - ok to restart hctz 12.5mg  daily.  Monitor bp and notify us with effect - want to avoid low bp as well.

## 2014-07-01 ENCOUNTER — Encounter (HOSPITAL_COMMUNITY)
Admission: RE | Admit: 2014-07-01 | Discharge: 2014-07-01 | Disposition: A | Discharge status: Home / Self Care | Payer: BC Managed Care – PPO | Source: Ambulatory Visit | Attending: Cardiovascular Disease | Admitting: Cardiovascular Disease

## 2014-07-01 DIAGNOSIS — Z5189 Encounter for other specified aftercare: Secondary | ICD-10-CM | POA: Diagnosis not present

## 2014-07-01 LAB — METHYLMALONIC ACID, SERUM: Methylmalonic Acid, Quant: 96 nmol/L (ref 87–318)

## 2014-07-01 NOTE — Telephone Encounter (Signed)
RECIEVED  VOICE MAIL   ONCE  AGAIN   PT  IS  AWARE  THAT NEEDS TO  F/U WITH PMD .Carl Lee

## 2014-07-01 NOTE — Telephone Encounter (Signed)
Patient notified and verbalized understanding. 

## 2014-07-03 ENCOUNTER — Encounter (HOSPITAL_COMMUNITY)
Admission: RE | Admit: 2014-07-03 | Discharge: 2014-07-03 | Disposition: A | Discharge status: Home / Self Care | Payer: BC Managed Care – PPO | Source: Ambulatory Visit | Attending: Cardiovascular Disease | Admitting: Cardiovascular Disease

## 2014-07-03 DIAGNOSIS — Z5189 Encounter for other specified aftercare: Secondary | ICD-10-CM | POA: Diagnosis not present

## 2014-07-08 ENCOUNTER — Other Ambulatory Visit: Payer: Self-pay | Admitting: Family Medicine

## 2014-07-08 ENCOUNTER — Encounter (HOSPITAL_COMMUNITY): Payer: BC Managed Care – PPO

## 2014-07-10 ENCOUNTER — Encounter: Payer: Self-pay | Admitting: Cardiovascular Disease

## 2014-07-10 ENCOUNTER — Ambulatory Visit (INDEPENDENT_AMBULATORY_CARE_PROVIDER_SITE_OTHER): Payer: BC Managed Care – PPO | Admitting: Cardiovascular Disease

## 2014-07-10 ENCOUNTER — Encounter (HOSPITAL_COMMUNITY): Payer: BC Managed Care – PPO

## 2014-07-10 ENCOUNTER — Encounter (HOSPITAL_COMMUNITY)
Admission: RE | Admit: 2014-07-10 | Discharge: 2014-07-10 | Disposition: A | Discharge status: Home / Self Care | Payer: BC Managed Care – PPO | Source: Ambulatory Visit | Attending: Cardiovascular Disease | Admitting: Cardiovascular Disease

## 2014-07-10 VITALS — BP 130/108 | HR 94 | Ht 69.0 in | Wt 200.1 lb

## 2014-07-10 DIAGNOSIS — Z8669 Personal history of other diseases of the nervous system and sense organs: Secondary | ICD-10-CM

## 2014-07-10 DIAGNOSIS — Z5189 Encounter for other specified aftercare: Secondary | ICD-10-CM | POA: Diagnosis not present

## 2014-07-10 DIAGNOSIS — I10 Essential (primary) hypertension: Secondary | ICD-10-CM

## 2014-07-10 MED ORDER — ATENOLOL 25 MG PO TABS: 25.0000 mg | ORAL_TABLET | Freq: Every day | ORAL | Status: DC

## 2014-07-10 NOTE — Progress Notes (Signed)
Patient ID: Carl Lee, male   DOB: 02-20-1956, 58 y.o.   MRN: [id number]    Cardiology Office Note   Date:  07/10/2014   ID:  UTAH DELAUDER, DOB 08/16/1955, MRN [id number]  PCP:  [name], MD  Cardiologist:  Dr. [name]     History of Present Illness: Carl Lee is a 58 y.o. male with a hx of recurrent bladder CA, mild carotid stenosis followed by [facility], AAA s/p endovascular repair (9/13 Dr. [name]), tobacco abuse, HL, HTN. He was evaluated for pre-op clearance with a stress myoview in July 2015. This was high risk with large scar burden and LHC was arranged.  This demonstrated 3 v CAD and he underwent CABG with Dr. [name] (L-LAD, RIMA-OM, S-PDA). Post op course was c/b increased pulmonary secretions requiring reintubation.   He has had difficulty swallowing since his CABG.  A barium swallow showed some primary esophageal dysphagia and a small Zenker's diverticulum.  He has been evaluated by Dr. Servando Snare.  CABG 02/07/14 PROCEDURE: Median sternotomy, extracorporeal circulation, coronary artery bypass grafting x3 (left internal mammary artery to LAD, right internal mammary artery to obtuse marginal via the transverse sinus, saphenous vein graft to posterior descending), endoscopic vein harvest, right thigh.  Has had labile BP at rehab  Seen by primary and diuretic added in am Also dizzyness and headache ? Migraine   Studies:  - LHC (7/15): pLAD 30 then 100 (L-L collats), pCFX 80, mCFX 50, pRCA 100 (R-R and L-R collats), EF 50%, ant HK and poss LV apical aneurysm - Nuclear (7/15): Inferior and anteroseptal, mid and dist anterior and dist inferior and apical fixed defect, no ischemia, EF 49%; High Risk - Carotid US (7/15): Bilateral ICA 1-39%   Recent Labs: 04/15/2014: ALT 10; LDL (calc) 66 06/10/2014: BUN 22; Creatinine 0.8; Potassium 3.7; Sodium 136 06/13/2014: Hemoglobin 15.5 06/17/2014: TSH 1.50    Recent Radiology: Ct Angio Abd/pel W/ And/or W/o   06/05/2014   IMPRESSION: VASCULAR  1. Successful endovascular abdominal aortic aneurysm repair with near total resolution of the previously identified and now excluded aneurysm sac. Today, the sac measures 4.5 x 3.5 cm compared to 4.8 x 4.9 cm previously. No evidence of endoleak or other complicating feature. 2. Developing mild aneurysmal dilatation of the right common iliac artery in the region of the right iliac limb landing zone. The common iliac artery measures 2.2 cm today compared to 1.7 cm previously. 3. Mild atherosclerotic vascular disease as described above without evidence of significant stenosis. NON VASCULAR  1. Colonic diverticulosis without evidence of active diverticulitis. 2. Large duodenum diverticula arise from the horizontal segment of the duodenum. Given the patient's clinical history, consider clinical evaluation for signs and symptoms of bacterial overgrowth. 3. Stable small renal cortical scarring in the interpolar left kidney. 4. Additional ancillary findings as above without significant interval change. Signed,  Carl Peaches, MD  [facility]  [facility]   Electronically Signed   By: Carl Lee M.D.   On: 06/05/2014 10:14      Wt Readings from Last 3 Encounters:  07/10/14 90.774 kg (200 lb 1.9 oz)  06/28/14 90.629 kg (199 lb 12.8 oz)  06/21/14 90.493 kg (199 lb 8 oz)     Past Medical History  Diagnosis Date  . Bladder cancer 08/16/2008    [facility] then [place] now [place])  . Hypertension   . AAA (abdominal aortic aneurysm) 03/25/12    s/p endovascular repair  . Hx  of migraines   . Contact dermatitis     atypical [name])  . Childhood asthma   . Environmental allergies     improved as ages  . Diverticulosis 2013    severe by CT and colonoscopy  . Adenomatous polyp 06/2009  . Lumbosacral radiculopathy at S1 03/2012    left, with spinal stenosis (MRI 04/2013) improved with TF ESI L5/S1 and S1/2 ([place])  . Spinal stenosis     LS1 nerve root impingement from bulging disc  . Coronary artery disease   . Zenker diverticulum   . Resistance to clopidogrel 2015    drug metabolism panel run - asked to scan    Current Outpatient Prescriptions  Medication Sig Dispense Refill  . aspirin EC 325 MG EC tablet Take 1 tablet (325 mg total) by mouth daily.    [name] atorvastatin (LIPITOR) 20 MG tablet Take 1 tablet (20 mg total) by mouth daily at 6 PM. 90 tablet 1  . Blood Glucose Monitoring Suppl (ONE TOUCH ULTRA SYSTEM KIT) W/DEVICE KIT 1 kit by Does not apply route once. 1 each 0  . cyclobenzaprine (FLEXERIL) 5 MG tablet Take 1 tablet (5 mg total) by mouth 3 (three) times daily as needed (migraine). 30 tablet 1  . Diclofenac Sodium POWD   0  . DULERA 100-5 MCG/ACT AERO Inhale 2 puffs into the lungs as needed.     . famotidine (PEPCID) 20 MG tablet Take 1 tablet (20 mg total) by mouth 2 (two) times daily. 180 tablet 3  . Glucosamine-Chondroit-Vit C-Mn (GLUCOSAMINE 1500 COMPLEX PO) Take 2 tablets by mouth daily.    [name] glucose blood test strip check once daily and as needed 100 each 3  . hydrochlorothiazide (MICROZIDE) 12.5 MG capsule TAKE 1 CAPSULE (12.5 MG TOTAL) BY MOUTH DAILY. 30 capsule 6  . HYDROcodone-acetaminophen (NORCO/VICODIN) 5-325 MG per tablet Take 1 tablet by mouth every 8 (eight) hours as needed for severe pain. 20 tablet 0  . levalbuterol (XOPENEX HFA) 45 MCG/ACT inhaler Inhale 1-2 puffs into the lungs every 6 (six) hours as needed for wheezing. 1 Inhaler 12  . lisinopril (PRINIVIL,ZESTRIL) 20 MG tablet Take 1 tablet (20 mg total) by mouth daily. 90 tablet 3  . metoprolol succinate (TOPROL-XL) 25 MG 24 hr tablet Take 0.5 tablets (12.5 mg total) by mouth daily.    . Multiple Vitamins-Minerals (CENTRUM SILVER ADULT 50+ PO) Take 1 capsule by mouth daily.    Glory Rosebush DELICA LANCETS 08X MISC Check blood sugar once a day and as directed. Dx. E11.8 100 each 3  . vitamin B-12 (CYANOCOBALAMIN)  1000 MCG tablet Take 1 tablet (1,000 mcg total) by mouth daily.     No current facility-administered medications for this visit.     Allergies:   Codeine   Social History:  The patient  reports that he quit smoking about 5 months ago. His smoking use included Cigarettes. He has a 40 pack-year smoking history. He has never used smokeless tobacco. He reports that he drinks alcohol. He reports that he does not use illicit drugs.   Family History:  The patient's family history includes Arrhythmia in his mother; CAD (age of onset: 64) in his father; COPD in his mother; Cancer in his mother; Dementia in his father; Diabetes in his brother, father, mother, and sister; Heart attack in his father and maternal grandmother; Hyperlipidemia in his mother; Hypertension in his brother and mother; Thyroid disease in his mother. There is no history of Colon cancer.  ROS:  Please see the history of present illness.   His wife notes that he does snore. She has witnessed apneic episodes. He does admit to fatigue and daytime hypersomnolence.   All other systems reviewed and negative.    PHYSICAL EXAM: VS:  BP 130/108 mmHg  Pulse 94  Ht _0  (1.753 m)  Wt 90.774 kg (200 lb 1.9 oz)  BMI 29.54 kg/m2 Well nourished, well developed, in no acute distress HEENT: normal Neck: no JVD Cardiac:  normal S1, S2;  RRR; no murmur   Chest:  Somewhat tender to palp.  No grinding sensation with manipulation of his sternum Lungs:   clear to auscultation bilaterally, no wheezing, rhonchi or rales Abd: soft, nontender, no hepatomegaly Ext: no edema Skin: warm and dry Neuro:  CNs 2-12 intact, no focal abnormalities noted     ROS: Denies fever, malais, weight loss, blurry vision, decreased visual acuity, cough, sputum, SOB, hemoptysis, pleuritic pain, palpitaitons, heartburn, abdominal pain, melena, lower extremity edema, claudication, or rash.  All other systems reviewed and negative  General: Affect  appropriate Healthy:  appears stated age 47: normal Neck supple with no adenopathy JVP normal no bruits no thyromegaly Lungs clear with no wheezing and good diaphragmatic motion Heart:  S1/S2 no murmur, no rub, gallop or click PMI normal Abdomen: benighn, BS positve, no tenderness, no AAA no bruit.  No HSM or HJR Distal pulses intact with no bruits No edema Neuro non-focal Skin warm and dry No muscular weakness   Current Outpatient Prescriptions  Medication Sig Dispense Refill  . aspirin EC 325 MG EC tablet Take 1 tablet (325 mg total) by mouth daily.    [name] atorvastatin (LIPITOR) 20 MG tablet Take 1 tablet (20 mg total) by mouth daily at 6 PM. 90 tablet 1  . Blood Glucose Monitoring Suppl (ONE TOUCH ULTRA SYSTEM KIT) W/DEVICE KIT 1 kit by Does not apply route once. 1 each 0  . cyclobenzaprine (FLEXERIL) 5 MG tablet Take 1 tablet (5 mg total) by mouth 3 (three) times daily as needed (migraine). 30 tablet 1  . Diclofenac Sodium POWD   0  . DULERA 100-5 MCG/ACT AERO Inhale 2 puffs into the lungs as needed.     . famotidine (PEPCID) 20 MG tablet Take 1 tablet (20 mg total) by mouth 2 (two) times daily. 180 tablet 3  . Glucosamine-Chondroit-Vit C-Mn (GLUCOSAMINE 1500 COMPLEX PO) Take 2 tablets by mouth daily.    [name] glucose blood test strip check once daily and as needed 100 each 3  . hydrochlorothiazide (MICROZIDE) 12.5 MG capsule TAKE 1 CAPSULE (12.5 MG TOTAL) BY MOUTH DAILY. 30 capsule 6  . HYDROcodone-acetaminophen (NORCO/VICODIN) 5-325 MG per tablet Take 1 tablet by mouth every 8 (eight) hours as needed for severe pain. 20 tablet 0  . levalbuterol (XOPENEX HFA) 45 MCG/ACT inhaler Inhale 1-2 puffs into the lungs every 6 (six) hours as needed for wheezing. 1 Inhaler 12  . lisinopril (PRINIVIL,ZESTRIL) 20 MG tablet Take 1 tablet (20 mg total) by mouth daily. 90 tablet 3  . metoprolol succinate (TOPROL-XL) 25 MG 24 hr tablet Take 0.5 tablets (12.5 mg total) by mouth daily.    . Multiple  Vitamins-Minerals (CENTRUM SILVER ADULT 50+ PO) Take 1 capsule by mouth daily.    Glory Rosebush DELICA LANCETS 71I MISC Check blood sugar once a day and as directed. Dx. E11.8 100 each 3  . vitamin B-12 (CYANOCOBALAMIN) 1000 MCG tablet Take 1 tablet (1,000 mcg total) by mouth daily.  No current facility-administered medications for this visit.    Allergies  Codeine   EKG:  NSR, HR 87, normal axis, septal Q waves, no significant change compared to prior tracing    Assessment and Plan

## 2014-07-10 NOTE — Assessment & Plan Note (Signed)
Lightheadedness and migraines f/u neuro not related to heart

## 2014-07-10 NOTE — Assessment & Plan Note (Signed)
Stable with no angina and good activity level.  Continue medical Rx  

## 2014-07-10 NOTE — Patient Instructions (Signed)
Your physician wants you to follow-up in:   [place] will receive a reminder letter in the mail two months in advance. If you don't receive a letter, please call our office to schedule the follow-up appointment. Your physician has recommended you make the following change in your medication: STOP  METOPROLOL AND  START  ATENOLOL  25 MG  EVERY DAY

## 2014-07-10 NOTE — Assessment & Plan Note (Signed)
Labile don't think this is related to dizzyness Given fatigue and headache will try atenolol instead of metoprolol

## 2014-07-13 ENCOUNTER — Encounter: Payer: Self-pay | Admitting: Family Medicine

## 2014-07-15 ENCOUNTER — Encounter (HOSPITAL_COMMUNITY)
Admission: RE | Admit: 2014-07-15 | Discharge: 2014-07-15 | Disposition: A | Discharge status: Home / Self Care | Payer: BLUE CROSS/BLUE SHIELD | Source: Ambulatory Visit | Attending: Cardiovascular Disease | Admitting: Cardiovascular Disease

## 2014-07-15 ENCOUNTER — Encounter (HOSPITAL_COMMUNITY): Payer: BLUE CROSS/BLUE SHIELD

## 2014-07-15 ENCOUNTER — Telehealth (HOSPITAL_COMMUNITY): Payer: Self-pay | Admitting: Cardiac Rehabilitation

## 2014-07-15 DIAGNOSIS — I739 Peripheral vascular disease, unspecified: Secondary | ICD-10-CM | POA: Diagnosis not present

## 2014-07-15 DIAGNOSIS — F17200 Nicotine dependence, unspecified, uncomplicated: Secondary | ICD-10-CM | POA: Insufficient documentation

## 2014-07-15 DIAGNOSIS — J449 Chronic obstructive pulmonary disease, unspecified: Secondary | ICD-10-CM | POA: Diagnosis not present

## 2014-07-15 DIAGNOSIS — Z8249 Family history of ischemic heart disease and other diseases of the circulatory system: Secondary | ICD-10-CM | POA: Diagnosis not present

## 2014-07-15 DIAGNOSIS — F419 Anxiety disorder, unspecified: Secondary | ICD-10-CM | POA: Insufficient documentation

## 2014-07-15 DIAGNOSIS — Z7982 Long term (current) use of aspirin: Secondary | ICD-10-CM | POA: Diagnosis not present

## 2014-07-15 DIAGNOSIS — C679 Malignant neoplasm of bladder, unspecified: Secondary | ICD-10-CM | POA: Diagnosis not present

## 2014-07-15 DIAGNOSIS — E785 Hyperlipidemia, unspecified: Secondary | ICD-10-CM | POA: Insufficient documentation

## 2014-07-15 DIAGNOSIS — I10 Essential (primary) hypertension: Secondary | ICD-10-CM | POA: Insufficient documentation

## 2014-07-15 DIAGNOSIS — E780 Pure hypercholesterolemia: Secondary | ICD-10-CM | POA: Insufficient documentation

## 2014-07-15 DIAGNOSIS — I2510 Atherosclerotic heart disease of native coronary artery without angina pectoris: Secondary | ICD-10-CM | POA: Insufficient documentation

## 2014-07-15 DIAGNOSIS — J9610 Chronic respiratory failure, unspecified whether with hypoxia or hypercapnia: Secondary | ICD-10-CM | POA: Insufficient documentation

## 2014-07-15 DIAGNOSIS — I2582 Chronic total occlusion of coronary artery: Secondary | ICD-10-CM | POA: Diagnosis not present

## 2014-07-15 DIAGNOSIS — Z951 Presence of aortocoronary bypass graft: Secondary | ICD-10-CM | POA: Diagnosis not present

## 2014-07-15 DIAGNOSIS — Z5189 Encounter for other specified aftercare: Secondary | ICD-10-CM | POA: Diagnosis not present

## 2014-07-15 DIAGNOSIS — Z79899 Other long term (current) drug therapy: Secondary | ICD-10-CM | POA: Insufficient documentation

## 2014-07-15 DIAGNOSIS — I200 Unstable angina: Secondary | ICD-10-CM | POA: Insufficient documentation

## 2014-07-15 DIAGNOSIS — Z885 Allergy status to narcotic agent status: Secondary | ICD-10-CM | POA: Diagnosis not present

## 2014-07-15 NOTE — Telephone Encounter (Signed)
-----   Message from [name], MD sent at 06/26/2014 12:30 PM EST ----- Regarding: RE: Dizziness at cardiac rehab Thanks for the update. ----- Message -----    From: Lowell Guitar, RN    Sent: 06/26/2014  10:11 AM      To: [name], MD Subject: Dizziness at cardiac rehab                     Dear Dr. Danise Mina,  Matador continues to c/o dizziness at cardiac rehab which is persistent on daily basis at home and at rehab. VSS at rest and exercise.     Pt feels dizziness increases with walking associated with feeling of imbalance and gait disturbance,often pt pulls to right when walking track.  I know he has discussed this with you and several other physicians without determined cause.  Just wanted to make you aware it is persistent.    Thank you, Andi Hence, RN, BSN Cardiac Pulmonary Rehab

## 2014-07-16 ENCOUNTER — Telehealth: Payer: Self-pay | Admitting: Family Medicine

## 2014-07-16 DIAGNOSIS — R42 Dizziness and giddiness: Secondary | ICD-10-CM

## 2014-07-16 DIAGNOSIS — R51 Headache: Principal | ICD-10-CM

## 2014-07-16 DIAGNOSIS — Z8669 Personal history of other diseases of the nervous system and sense organs: Secondary | ICD-10-CM

## 2014-07-16 DIAGNOSIS — R519 Headache, unspecified: Secondary | ICD-10-CM

## 2014-07-16 NOTE — Telephone Encounter (Signed)
Pt has checked with cardiologist and it is ok and would like to move forward with Neurology referral.  Pt prefers to go to [place]

## 2014-07-16 NOTE — Telephone Encounter (Signed)
Referral placed.

## 2014-07-17 ENCOUNTER — Ambulatory Visit (INDEPENDENT_AMBULATORY_CARE_PROVIDER_SITE_OTHER): Payer: BLUE CROSS/BLUE SHIELD | Admitting: Neurology

## 2014-07-17 ENCOUNTER — Encounter: Payer: Self-pay | Admitting: Neurology

## 2014-07-17 ENCOUNTER — Encounter (HOSPITAL_COMMUNITY): Payer: BLUE CROSS/BLUE SHIELD

## 2014-07-17 VITALS — BP 110/68 | HR 58 | Ht 69.0 in | Wt 201.1 lb

## 2014-07-17 DIAGNOSIS — R278 Other lack of coordination: Secondary | ICD-10-CM

## 2014-07-17 DIAGNOSIS — G44209 Tension-type headache, unspecified, not intractable: Secondary | ICD-10-CM

## 2014-07-17 DIAGNOSIS — Z8669 Personal history of other diseases of the nervous system and sense organs: Secondary | ICD-10-CM

## 2014-07-17 DIAGNOSIS — G609 Hereditary and idiopathic neuropathy, unspecified: Secondary | ICD-10-CM

## 2014-07-17 DIAGNOSIS — R2681 Unsteadiness on feet: Secondary | ICD-10-CM

## 2014-07-17 NOTE — Patient Instructions (Addendum)
1.  Check blood work 2.  EMG of the legs 3.  Encouraged to start using a cane for support 4.  Continue vitamin B12 1039mcg daily 5.  Agree with sleep evaluation 6.  Return to clinic in 103-months   [facility]  Preventing Falls in the [place] are common, often dreaded events in the lives of older people. Aside from the obvious injuries and even death that may result, falls can cause wide-ranging consequences including loss of independence, mental decline, decreased activity, and mobility. Younger people are also at risk of falling, especially those with chronic illnesses and fatigue.  Ways to reduce the risk for falling:  * Examine diet and medications. Warm foods and alcohol dilate blood vessels, which can lead to dizziness when standing. Sleep aids, antidepressants, and pain medications can also increase the likelihood of a fall.  * Get a vison exam. Poor vision, cataracts, and glaucoma increase the chances of falling.  * Check foot gear. Shoes should fit snugly and have a sturdy, nonskid sole and broad, low heel.  * Participate in a physician-approved exercise program to build and maintain muscle strength and improve balance and coordination.  * Increase vitamin D intake. Vitamin D improves muscle strength and increases the amount of calcium the body is able to absorb and deposit in bones.  How to prevent falls from common hazards:  * Floors - Remove all loose wires, cords, and throw rugs. Minimize clutter. Make sure rugs are anchored and smooth. Keep furniture in its usual place.  * Chairs - Use chairs with straight backs, armrests, and firm seats. Add firm cushions to existing pieces to add height.  * Bathroom - Install grab bars and non-skid tape in the tub or shower. Use a bathtub transfer bench or a shower chair with a back support. Use an elevated toilet seat and/or safety rails to assist standing from a low surface. Do not use towel racks or bathroom tissue holders to help you  stand.  * Lighting - Make sure halls, stairways, and entrances are well-lit. Install a night light in your bathroom or hallway. Make sure there is a light switch at the top and bottom of the staircase. Turn lights on if you get up in the middle of the night. Make sure lamps or light switches are within reach of the bed if you have to get up during the night.  * Kitchen - Install non-skid rubber mats near the sink and stove. Clean spills immediately. Store frequently used utensils, pots, and pans between waist and eye level. This helps prevent reaching and bending. Sit when getting things out of the lower cupboards.  * Living room / [place] furniture with wide spaces in between, giving enough room to move around. Establish a route through the living room that gives you something to hold onto as you walk.  * Stairs - Make sure treads, rails, and rugs are secure. Install a rail on both sides of the stairs. If stairs are a threat, it might be helpful to arrange most of your activities on the lower level to reduce the number of times you must climb the stairs.  * Entrances and doorways - Install metal handles on the walls adjacent to the doorknobs of all doors to make it more secure as you travel through the doorway.  Tips for maintaining balance:  * Keep at least one hand free at all times Try using a backpack or fanny pack to hold things  rather than carrying them in your hands. Never carry objects in both hands when walking as this interferes with keeping your balance.  * Attempt to swing both arms from front to back while walking. This might require a conscious effort if Parkinson's disease has diminished your movement. It will, however, help you to maintain balance and posture, and reduce fatigue.  * Consciously lift your feet off the ground when walking. Shuffling and dragging of the feet is a common culprit in losing your balance.  * When trying to navigate turns, use a "U" technique of facing  forward and making a wide turn, rather than pivoting sharply.  * Try to stand with your feet shoulder-length apart. When your feet are close together for any length of time, you increase your risk of losing your balance and falling.  * Do one thing at a time. Do not try to walk and accomplish another task, such as reading or looking around. The decrease in your automatic reflexes complicates motor function, so the less distraction, the better.  * Do not wear rubber or gripping soled shoes, they might "catch" on the floor and cause tripping.  * Move slowly when changing positions. Use deliberate, concentrated movements and, if needed, use a grab bar or walking aid. Count fifteen (15) seconds after standing to begin walking.  * If balance is a continuous problem, you might want to consider a walking aid such as a cane, walking stick, or walker. Once you have mastered walking with help, you may be ready to try it again on your own.  This information is provided by [facility] and is not intended to replace the medical advice of your physician or other health care providers. Please consult your physician or other health care providers for advice regarding your specific medical condition.

## 2014-07-17 NOTE — Progress Notes (Signed)
[facility] Clinic Note - Initial Visit   Date: 07/17/2014  Carl Lee MRN: [id number] DOB: [birth year]   Dear Dr. Danise Mina:  Thank you for your kind referral of Carl Lee for consultation of headaches and fatigue. Although his history is well known to you, please allow [place] to reiterate it for the purpose of our medical record. The patient was accompanied to the clinic by wife who also provides collateral information.     History of Present Illness: Carl Lee is a 59 y.o. right-handed Caucasian male with bladder cancer s/p resection and laser ablation (2010, 2015), CAD s/p CABG, AAA s/p repair (2013), lumbar spinal stenosis, prior tobacco use, pre-diabetes, and hypertension presenting for evaluation of headaches, imbalance, and fatigue.    He reports having migraines since the age of 59.  In early 2000s, he went to [facility] for severe headaches and was told he had an "empty sella".  Pain was holocephalic, throbbing, and pounding.  He had associated n/v, photophobia, and phonophobia.  He has noticed that MSG is a trigger.  He was having daily headaches prior to that with severe migraines every 2-3 months.  He was taking a lot of BC powder.  He was seen by a neurologist at [facility] about 20 years ago and started on Inderral, but became very irritable so it was stopped.  His headaches resolved in 2013 following his AAA repair.    He was doing well from a headache standpoint until July 30th 2015 which is the time he underwent CABG. Since then, he has a dull bifrontal headache which is daily and constant.  Pain is ranked as 2/10.  He does not take any medication for it. In December, he has one spell of severe pain which woke him up from sleeping.  Pain started in the neck and shoulders and became holocephalic.  Pain was sharp, throbbing, and pounding.  He started vomiting and feeling lighteaded.  He went to see his PCP who order CT head which returned  negative and started flexeril which helped. He has not had any severe headaches since then.   Also following his surgery, he developed imbalance and fatigue.  He has not fallen, but endorses frequent stumbling.  He feels lighteadedness as if he was drunk.  It is worse with exercise.  He denies any numbness/tingling of the feet.  Regarding his fatigue, he is generally doing well until mid-day and then feels tired as if he needs to take a nap and tries to sleep by 8pm.  Wife says that he does snore and has a sleep study later this month.  He also was noted to have borderline-low vitamin B12 levels (274) and was started on oral supplementation.  Denies any muscle weakness.     Out-side paper records, electronic medical record, and images have been reviewed where available and summarized as:  CT head 06/20/2014:  Negative  Lab Results  Component Value Date   HGBA1C 6.8* 06/13/2014   Lab Results  Component Value Date   TSH 1.50 06/17/2014   Component     Latest Ref Rng 06/21/2014 06/28/2014  Vitamin B-12     211 - 911 pg/mL 274 324     Past Medical History  Diagnosis Date  . Bladder cancer 08/16/2008    [facility] [place] now [place])  . Hypertension   . AAA (abdominal aortic aneurysm) 03/25/12    s/p endovascular repair  . Hx of migraines   .  Contact dermatitis     atypical [name])  . Childhood asthma   . Environmental allergies     improved as ages  . Diverticulosis 2013    severe by CT and colonoscopy  . Adenomatous polyp 06/2009  . Lumbosacral radiculopathy at S1 03/2012    left, with spinal stenosis (MRI 04/2013) improved with TF ESI L5/S1 and S1/2 ([facility])  . Spinal stenosis     LS1 nerve root impingement from bulging disc  . Coronary artery disease   . Zenker diverticulum   . Resistance to clopidogrel 2015    drug metabolism panel run - asked to scan    Past Surgical History  Procedure Laterality Date  . Tonsillectomy and adenoidectomy  1973  . Vasectomy     . Bladder cancer      ([place]) x 2  . Abdominal aortic aneurysm repair  03/25/12  . Colonoscopy  06/2012    hyperplastic polyp, diverticulosis ([facility]) rec rpt 5 yrs  . Cystoscopy  08/16/08    Bladder Cancer  . Esi  04/2013, 06/2013    L L5S1, S12 transforaminal ESI (Dr.  [name])  . Abi  05/2013    WNL, L TBI low at 0.66  . Coronary artery bypass graft N/A 02/07/2014    Procedure: CORONARY ARTERY BYPASS GRAFTING (CABG);  Surgeon: [name], MD;  Location: [place];  Service: Open Heart Surgery;  Laterality: N/A;  CABG X 3, BILATERAL LIMA, EVH  . Intraoperative transesophageal echocardiogram N/A 02/07/2014    Procedure: INTRAOPERATIVE TRANSESOPHAGEAL ECHOCARDIOGRAM;  Surgeon: [name], MD;  Location: [place];  Service: Open Heart Surgery;  Laterality: N/A;  . Esi Left 04/2014, 05/2014, 06/2014    L5/S1, S1/2; rpt; L4/5  . Left heart catheterization with coronary angiogram N/A 02/01/2014    Procedure: LEFT HEART CATHETERIZATION WITH CORONARY ANGIOGRAM;  Surgeon: [name], MD;  Location: [facility] CATH LAB;  Service: Cardiovascular;  Laterality: N/A;     Medications:  Current Outpatient Prescriptions on File Prior to Visit  Medication Sig Dispense Refill  . aspirin EC 325 MG EC tablet Take 1 tablet (325 mg total) by mouth daily.    [name] atenolol (TENORMIN) 25 MG tablet Take 1 tablet (25 mg total) by mouth daily. 90 tablet 3  . atorvastatin (LIPITOR) 20 MG tablet Take 1 tablet (20 mg total) by mouth daily at 6 PM. 90 tablet 1  . Blood Glucose Monitoring Suppl (ONE TOUCH ULTRA SYSTEM KIT) W/DEVICE KIT 1 kit by Does not apply route once. 1 each 0  . cyclobenzaprine (FLEXERIL) 5 MG tablet Take 1 tablet (5 mg total) by mouth 3 (three) times daily as needed (migraine). 30 tablet 1  . Diclofenac Sodium POWD   0  . DULERA 100-5 MCG/ACT AERO Inhale 2 puffs into the lungs as needed.     . famotidine (PEPCID) 20 MG tablet Take 1 tablet (20 mg total) by mouth 2 (two)  times daily. 180 tablet 3  . Glucosamine-Chondroit-Vit C-Mn (GLUCOSAMINE 1500 COMPLEX PO) Take 2 tablets by mouth daily.    [name] glucose blood test strip check once daily and as needed 100 each 3  . hydrochlorothiazide (MICROZIDE) 12.5 MG capsule TAKE 1 CAPSULE (12.5 MG TOTAL) BY MOUTH DAILY. 30 capsule 6  . HYDROcodone-acetaminophen (NORCO/VICODIN) 5-325 MG per tablet Take 1 tablet by mouth every 8 (eight) hours as needed for severe pain. 20 tablet 0  . levalbuterol (XOPENEX HFA) 45 MCG/ACT inhaler Inhale 1-2 puffs into the lungs  every 6 (six) hours as needed for wheezing. 1 Inhaler 12  . lisinopril (PRINIVIL,ZESTRIL) 20 MG tablet Take 1 tablet (20 mg total) by mouth daily. 90 tablet 3  . Multiple Vitamins-Minerals (CENTRUM SILVER ADULT 50+ PO) Take 1 capsule by mouth daily.    Glory Rosebush DELICA LANCETS 08M MISC Check blood sugar once a day and as directed. Dx. E11.8 100 each 3  . vitamin B-12 (CYANOCOBALAMIN) 1000 MCG tablet Take 1 tablet (1,000 mcg total) by mouth daily.     No current facility-administered medications on file prior to visit.    Allergies:  Allergies  Allergen Reactions  . Codeine Other (See Comments)    Nightmares    Family History: Family History  Problem Relation Age of Onset  . Diabetes Mother   . Arrhythmia Mother     pacemaker  . Cancer Mother     Bladder  . COPD Mother   . Thyroid disease Mother   . Hyperlipidemia Mother   . Hypertension Mother   . Diabetes Father   . CAD Father 90    CHF, MI  . Dementia Father   . Diabetes Sister   . Colon cancer Neg Hx   . Diabetes Brother   . Hypertension Brother   . Heart attack Maternal Grandmother   . Heart attack Father     Social History: History   Social History  . Marital Status: Married    Spouse Name: N/A    Number of Children: 2  . Years of Education: N/A   Occupational History  . Self Employed    Social History Main Topics  . Smoking status: Former Smoker -- 1.00 packs/day for 40 years     Types: Cigarettes    Quit date: 02/05/2014  . Smokeless tobacco: Never Used  . Alcohol Use: 0.0 oz/week    0 Not specified per week     Comment: Rare- states 1 oz. per week  . Drug Use: No  . Sexual Activity: Not on file   Other Topics Concern  . Not on file   Social History Narrative   Caffeine: 1 cup coffee/day   Lives with wife, 1 dog   grown children   Occupation: Therapist, sports - self employed.  Not working, applied for disability.    Edu: 2 yrs college   Activity: fishing, walking occasionally   Diet: good water, fruits/vegetables daily    Review of Systems:  CONSTITUTIONAL: No fevers, chills, night sweats, or weight loss.   EYES: No visual changes or eye pain +headaches ENT: No hearing changes.  No history of nose bleeds.   RESPIRATORY: No cough, wheezing and shortness of breath.   CARDIOVASCULAR: Negative for chest pain, and palpitations.   GI: Negative for abdominal discomfort, blood in stools or black stools.  No recent change in bowel habits.   GU:  No history of incontinence.   MUSCLOSKELETAL: + history of joint pain or swelling.  No myalgias.   SKIN: Negative for lesions, rash, and itching.   HEMATOLOGY/ONCOLOGY: Negative for prolonged bleeding, bruising easily, and swollen nodes.  +history of cancer.   ENDOCRINE: Negative for cold or heat intolerance, polydipsia or goiter.   PSYCH:  No depression or anxiety symptoms.   NEURO: As Above.   Vital Signs:  BP 110/68 mmHg  Pulse 58  Ht _0  (1.753 m)  Wt 201 lb 1 oz (91.201 kg)  BMI 29.68 kg/m2  SpO2 97% Pain Scale: 2 on a scale of 0-10  General Medical Exam:   General:  Well appearing, comfortable.   Eyes/ENT: see cranial nerve examination.   Neck: No masses appreciated.  Full range of motion without tenderness.  No carotid bruits. Respiratory:  Clear to auscultation, good air entry bilaterally.   Cardiac:  Regular rate and rhythm, no murmur.   Extremities:  No deformities, edema, or skin  discoloration.  Skin: Reduced hair growth on legs.   Neurological Exam: MENTAL STATUS including orientation to time, place, person, recent and remote memory, attention span and concentration, language, and fund of knowledge is normal.  Speech is not dysarthric.  CRANIAL NERVES: II:  No visual field defects.  Unremarkable fundi.   III-IV-VI: Pupils equal round and reactive to light.  Normal conjugate, extra-ocular eye movements in all directions of gaze.  No nystagmus.  No ptosis.   V:  Normal facial sensation.  VII:  Normal facial symmetry and movements.  VIII:  Normal hearing and vestibular function.   IX-X:  Normal palatal movement.   XI:  Normal shoulder shrug and head rotation.   XII:  Normal tongue strength and range of motion, no deviation or fasciculation.  MOTOR:  No atrophy, fasciculations or abnormal movements.  No pronator drift.  Tone is normal.    Right Upper Extremity:    Left Upper Extremity:    Deltoid  5/5   Deltoid  5/5   Biceps  5/5   Biceps  5/5   Triceps  5/5   Triceps  5/5   Wrist extensors  5/5   Wrist extensors  5/5   Wrist flexors  5/5   Wrist flexors  5/5   Finger extensors  5/5   Finger extensors  5/5   Finger flexors  5/5   Finger flexors  5/5   Dorsal interossei  5/5   Dorsal interossei  5/5   Abductor pollicis  5/5   Abductor pollicis  5/5   Tone (Ashworth scale)  0  Tone (Ashworth scale)  0   Right Lower Extremity:    Left Lower Extremity:    Hip flexors  5/5   Hip flexors  5/5   Hip extensors  5/5   Hip extensors  5/5   Knee flexors  5/5   Knee flexors  5/5   Knee extensors  5/5   Knee extensors  5/5   Dorsiflexors  5/5   Dorsiflexors  5/5   Plantarflexors  5/5   Plantarflexors  5/5   Toe extensors  5/5   Toe extensors  5/5   Toe flexors  5/5   Toe flexors  5/5   Tone (Ashworth scale)  0  Tone (Ashworth scale)  0   MSRs:  Right                                                                 Left brachioradialis 2+  brachioradialis 2+    biceps 2+  biceps 2+  triceps 2+  triceps 2+  patellar 2+  patellar 2+  ankle jerk 2+  ankle jerk 2+  Hoffman no  Hoffman no  plantar response down  plantar response down   SENSORY:  Vibration absent at the great toe and reduced at ankles bilaterally.  Impaired proprioception at the great toe.  Pin  prick is reduced in a generalized pattern over the arms and legs. Temperature reduced in the feet.  Romberg's sign is present.   COORDINATION/GAIT:  Normal finger-to- nose-finger.  Intact rapid alternating movements bilaterally.  Able to rise from a chair without using arms.  Gait is slow and cautious, slightly wide-based.  He is unsteady with tandem gait.  Toe walking is difficult due to imbalance, but heel walking intact.    IMPRESSION: Mr. Dortch is a 59 year-old gentleman presenting for evaluation of headaches, imbalance, and fatigue.  His neurological examination shows a distal predominant large fiber peripheral neuropathy. I had extensive discussion with the patient regarding the pathogenesis, etiology, management, and natural course of neuropathy. Neuropathy tends to be slowly progressive, especially if a treatable etiology is not identified.  I would like to test for treatable causes of neuropathy. I discussed that in the vast majority of cases, despite checking for reversible causes, we are unable to find the underlying etiology and management is symptomatic.  Risk factors  Pre-diabetes, low vitamin B12.  Regarding his fatigue, I agree with sleep evaluation since untreated OSA could be possible.  Also, low vitamin B12 levels may also present with various symptoms including gait imbalance and fatigue.  Currently he is taking oral supplementation, but if there is worsening of symptoms, low threshold to start injections.    If EMG does not reveal neuropathy, MRI brain would be the next step.  Finally, he has episodic migraine with low-grade daily tension headaches.  At time time, frequency or  intensity is not enough to start daily preventative medication.     PLAN/RECOMMENDATIONS:  1.  Check copper, ceruloplasmin, vitamin B1, vitamin B6, SPEP/UPEP with IFE 2.  EMG of the legs 3.  Encouraged to start using a cane for support 4.  Continue vitamin B12 1061mg daily 5.  Agree with sleep evaluation 6.  Fall precautions discussed and literature was provided 7.  For severe migraine, ok to use flexeril as abortive agent 8.  Return to clinic in 265-month  The duration of this appointment visit was 50 minutes of face-to-face time with the patient.  Greater than 50% of this time was spent in counseling, explanation of diagnosis, planning of further management, and coordination of care.   Thank you for allowing me to participate in patient's care.  If I can answer any additional questions, I would be pleased to do so.    Sincerely,    Tavarius Grewe K. PaPosey ProntoDO

## 2014-07-19 ENCOUNTER — Encounter (HOSPITAL_COMMUNITY)
Admission: RE | Admit: 2014-07-19 | Discharge: 2014-07-19 | Disposition: A | Discharge status: Home / Self Care | Payer: BLUE CROSS/BLUE SHIELD | Source: Ambulatory Visit | Attending: Cardiovascular Disease | Admitting: Cardiovascular Disease

## 2014-07-19 ENCOUNTER — Encounter (HOSPITAL_COMMUNITY): Payer: BLUE CROSS/BLUE SHIELD

## 2014-07-19 DIAGNOSIS — Z0279 Encounter for issue of other medical certificate: Secondary | ICD-10-CM

## 2014-07-19 DIAGNOSIS — Z5189 Encounter for other specified aftercare: Secondary | ICD-10-CM | POA: Diagnosis not present

## 2014-07-19 LAB — SPEP & IFE WITH QIG
Albumin ELP: 61.4 % (ref 55.8–66.1)
Alpha-1-Globulin: 4.1 % (ref 2.9–4.9)
Alpha-2-Globulin: 12.4 % — ABNORMAL HIGH (ref 7.1–11.8)
BETA 2: 4.1 % (ref 3.2–6.5)
BETA GLOBULIN: 7.7 % — AB (ref 4.7–7.2)
Gamma Globulin: 10.3 % — ABNORMAL LOW (ref 11.1–18.8)
IGA: 113 mg/dL (ref 68–379)
IGG (IMMUNOGLOBIN G), SERUM: 629 mg/dL — AB (ref 650–1600)
IGM, SERUM: 107 mg/dL (ref 41–251)
TOTAL PROTEIN, SERUM ELECTROPHOR: 6.6 g/dL (ref 6.0–8.3)

## 2014-07-19 LAB — UIFE/LIGHT CHAINS/TP QN, 24-HR UR
ALPHA 1 UR: DETECTED — AB
ALPHA 2 UR: DETECTED — AB
Albumin, U: DETECTED
Beta, Urine: DETECTED — AB
Gamma Globulin, Urine: DETECTED — AB
Total Protein, Urine: <4 mg/dL — ABNORMAL LOW (ref 5–25)

## 2014-07-19 LAB — CERULOPLASMIN: Ceruloplasmin: 26 mg/dL (ref 18–36)

## 2014-07-19 LAB — COPPER, SERUM: Copper: 107 ug/dL (ref 70–175)

## 2014-07-22 ENCOUNTER — Encounter (HOSPITAL_COMMUNITY)
Admission: RE | Admit: 2014-07-22 | Discharge: 2014-07-22 | Disposition: A | Discharge status: Home / Self Care | Payer: BLUE CROSS/BLUE SHIELD | Source: Ambulatory Visit | Attending: Cardiovascular Disease | Admitting: Cardiovascular Disease

## 2014-07-22 DIAGNOSIS — Z5189 Encounter for other specified aftercare: Secondary | ICD-10-CM | POA: Diagnosis not present

## 2014-07-22 LAB — VITAMIN B6: Vitamin B6: 60.2 ng/mL — ABNORMAL HIGH (ref 2.1–21.7)

## 2014-07-23 LAB — VITAMIN B1: VITAMIN B1 (THIAMINE): 18 nmol/L (ref 8–30)

## 2014-07-24 ENCOUNTER — Encounter (HOSPITAL_COMMUNITY): Payer: Self-pay

## 2014-07-24 ENCOUNTER — Encounter (HOSPITAL_COMMUNITY)
Admission: RE | Admit: 2014-07-24 | Discharge: 2014-07-24 | Disposition: A | Discharge status: Home / Self Care | Payer: BLUE CROSS/BLUE SHIELD | Source: Ambulatory Visit | Attending: Cardiovascular Disease | Admitting: Cardiovascular Disease

## 2014-07-24 ENCOUNTER — Telehealth: Payer: Self-pay | Admitting: *Deleted

## 2014-07-24 DIAGNOSIS — Z5189 Encounter for other specified aftercare: Secondary | ICD-10-CM | POA: Diagnosis not present

## 2014-07-24 NOTE — Progress Notes (Signed)
Pt graduated from cardiac rehab program today with completion of 36 exercise sessions in Phase II. Pt maintained good attendance, however exercise progression somewhat limited by continued dizziness, fatigue and labile blood pressure.   Dizziness is worsened with exercise. Pt has been evaluated for dizziness by multiple physicians without resolution.  Pt is currently undergoing neurology evaluation for dizziness and gait disturbance.  Medication list reconciled. Repeat  PHQ score- 1, pt is discouraged that his stamina and physical well being has not returned to presurgical condition.  Pt feels discouraged by his inability to participate in pleasurable and profitable activities such as work and deep sea fishing.  Pt illness has created financial burden for pt.  Pt is also greatly discouraged by his continued fatigue with daytime sleepiness and napping.    Pt is working towards making lifestyle changes.  Pt verbalizes maintenance of smoking cessation.  Pt states he has not smoked since July 2015.  Pt was congratulated on this success.   Pt encouraged to continue making lifestyle changes and continued conversation with physicians about his symptoms and physical limitations. Understanding verbalized.    Pt plans to continue exercising on his own.  Pt will need physician encouragement to continue exercise regimen.

## 2014-07-24 NOTE — Telephone Encounter (Signed)
Patient called back about the B6.  He was instructed to stop any B6 vitamins but he is on a multivitamin that contains B6.  Should he continue with this?

## 2014-07-25 NOTE — Telephone Encounter (Signed)
Left message giving patient instructions. 

## 2014-07-25 NOTE — Telephone Encounter (Signed)
OK to continue multivitamin.  I don't think elevated levels are contributing to his symptoms.  Sota Hetz K. Posey Pronto, DO

## 2014-07-26 ENCOUNTER — Encounter (HOSPITAL_COMMUNITY)
Admission: RE | Admit: 2014-07-26 | Discharge: 2014-07-26 | Disposition: A | Discharge status: Home / Self Care | Payer: BLUE CROSS/BLUE SHIELD | Source: Ambulatory Visit | Attending: Cardiovascular Disease | Admitting: Cardiovascular Disease

## 2014-07-26 DIAGNOSIS — Z5189 Encounter for other specified aftercare: Secondary | ICD-10-CM | POA: Diagnosis not present

## 2014-07-29 ENCOUNTER — Telehealth (HOSPITAL_COMMUNITY): Payer: Self-pay | Admitting: Cardiac Rehabilitation

## 2014-07-29 NOTE — Telephone Encounter (Signed)
-----   Message from Josue Hector, MD sent at 07/25/2014 11:52 AM EST ----- Regarding: RE: cardiac rehab Continue current dose of diuretic for now  ----- Message -----    From: Lowell Guitar, RN    Sent: 07/24/2014   4:27 PM      To: Josue Hector, MD, Rowe Pavy, RN Subject: cardiac rehab                                  Dear Dr. [name],  Pt weight up 2.4kg at cardiac rehab today over 5 days.  (93.7kg up from 91.1kg on Friday-average weight at cardiac rehab 90-91kg).   Pt c/o mild dyspnea on exertion.  No edema noted.  Pt lungs clear, 02 sat-99%.  Pt does admit to eating ham over the weekend.  Pt continues to c/o dizziness with rest which is worsened with exertion.  Pt is currently taking HCTZ 12.5mg  daily.  Please advise.  Thank you, Andi Hence, RN, BSN Cardiac Pulmonary Rehab

## 2014-08-19 ENCOUNTER — Ambulatory Visit (INDEPENDENT_AMBULATORY_CARE_PROVIDER_SITE_OTHER): Payer: BLUE CROSS/BLUE SHIELD | Admitting: Neurology

## 2014-08-19 DIAGNOSIS — R2681 Unsteadiness on feet: Secondary | ICD-10-CM

## 2014-08-19 DIAGNOSIS — G609 Hereditary and idiopathic neuropathy, unspecified: Secondary | ICD-10-CM

## 2014-08-19 DIAGNOSIS — G44209 Tension-type headache, unspecified, not intractable: Secondary | ICD-10-CM

## 2014-08-19 DIAGNOSIS — R278 Other lack of coordination: Secondary | ICD-10-CM

## 2014-08-19 NOTE — Procedures (Signed)
[facility]  [address]  [place], [place] [address] Tel: [phone] Fax:  [phone] Test Date:  08/19/2014  Patient: Carl Lee DOB: 26-Dec-1955 Physician: Narda Amber  Sex: Male Height: 5\' 9"  Ref Phys: Narda Amber  ID#: [id number]   Technician: Laureen Ochs   Patient Complaints:  Patient is a 59 year old male here for evaluation of imbalance problems and numbness involving the arms.  NCV & EMG Findings: Extensive electrodiagnostic testing of the left upper and lower extremity shows:  1. Left median, ulnar, radial, and palmar sensory responses are within normal limits. 2. Left ulnar motor nerve shows decreased conduction velocity across the elbow. The left median motor nerve is within normal limits. 3. Left sural and superficial peroneal sensory responses are within normal limits. 4. Left peroneal and tibial motor responses are within normal limits. 5. There is no evidence of active or chronic motor axon loss changes affecting any of the tested muscles. Motor unit configuration and recruitment pattern is within normal limits.  Impression: 1. Left ulnar neuropathy at the elbow, purely demyelinating in type. 2. There is no evidence of generalized sensorimotor polyneuropathy, carpal tunnel syndrome, cervical/lumbosacral radiculopathy affecting the left side.   ___________________________ Narda Amber    Nerve Conduction Studies Anti Sensory Summary Table   Stim Site NR Peak (ms) Norm Peak (ms) P-T Amp (V) Norm P-T Amp  Left Median Anti Sensory (2nd Digit)  Wrist    3.1 <3.8 32.1 >10  Left Radial Anti Sensory (Base 1st Digit)  Wrist    2.0 <2.8 32.3 >10  Left Sup Peroneal Anti Sensory (Ant Lat Mall)  12 cm    2.8 <4.6 7.5 >3  Left Sural Anti Sensory (Lat Mall)  Calf    3.7 <4.6 23.8 >3  Left Ulnar Anti Sensory (5th Digit)  Wrist    2.9 <3.2 30.7 >5   Motor Summary Table   Stim Site NR Onset (ms) Norm Onset (ms) O-P Amp (mV) Norm O-P Amp  Site1 Site2 Delta-0 (ms) Dist (cm) Vel (m/s) Norm Vel (m/s)  Left Median Motor (Abd Poll Brev)  Wrist    2.8 <4.0 8.1 >5 Elbow Wrist 6.0 31.0 52 >50  Elbow    8.8  7.9         Left Peroneal Motor (Ext Dig Brev)  Ankle    4.7 <6.0 3.5 >2.5 B Fib Ankle 7.8 33.0 42 >40  B Fib    12.5  3.0  Poplt B Fib 2.0 9.0 45 >40  Poplt    14.5  3.0         Left Peroneal TA Motor (Tib Ant)  Fib Head    3.0 <4.5 6.7 >3 Poplit Fib Head 1.8 9.5 53 >40  Poplit    4.8  6.5         Left Tibial Motor (Abd Hall Brev)  Ankle    4.6 <6.0 12.9 >4 Knee Ankle 9.5 41.0 43 >40  Knee    14.1  9.6         Left Ulnar Motor (Abd Dig Minimi)  Wrist    2.5 <3.1 8.7 >7 B Elbow Wrist 4.4 27.0 61 >50  B Elbow    6.9  8.5  A Elbow B Elbow 2.2 10.0 45 >50  A Elbow    9.1  8.5          Comparison Summary Table   Stim Site NR Peak (ms) Norm Peak (ms) P-T Amp (V) Site1  Site2 Delta-P (ms) Norm Delta (ms)  Left Median/Ulnar Palm Comparison (Wrist - 8cm)  Median Palm    1.9 <2.2 53.9 Median Palm Ulnar Palm 0.1   Ulnar Palm    2.0 <2.2 10.6       H Reflex Studies   NR H-Lat (ms) Lat Norm (ms) L-R H-Lat (ms)  Left Tibial (Gastroc)     34.42 <35    EMG   Side Muscle Ins Act Fibs Psw Fasc Number Recrt Dur Dur. Amp Amp. Poly Poly. Comment  Left 1stDorInt Nml Nml Nml Nml Nml Nml Nml Nml Nml Nml Nml Nml N/A  Left Ext Indicis Nml Nml Nml Nml Nml Nml Nml Nml Nml Nml Nml Nml N/A  Left ABD Dig Min Nml Nml Nml Nml Nml Nml Nml Nml Nml Nml Nml Nml N/A  Left PronatorTeres Nml Nml Nml Nml Nml Nml Nml Nml Nml Nml Nml Nml N/A  Left Biceps Nml Nml Nml Nml Nml Nml Nml Nml Nml Nml Nml Nml N/A  Left Triceps Nml Nml Nml Nml Nml Nml Nml Nml Nml Nml Nml Nml N/A  Left Deltoid Nml Nml Nml Nml Nml Nml Nml Nml Nml Nml Nml Nml N/A  Left FlexDigProf 4,5 Nml Nml Nml Nml Nml Nml Nml Nml Nml Nml Nml Nml N/A  Left AntTibialis Nml Nml Nml Nml Nml Nml Nml Nml Nml Nml Nml Nml N/A  Left Gastroc Nml Nml Nml Nml Nml Nml Nml Nml Nml Nml Nml Nml N/A  Left Flex  Dig Long Nml Nml Nml Nml Nml Nml Nml Nml Nml Nml Nml Nml N/A  Left RectFemoris Nml Nml Nml Nml Nml Nml Nml Nml Nml Nml Nml Nml N/A  Left GluteusMed Nml Nml Nml Nml Nml Nml Nml Nml Nml Nml Nml Nml N/A      Waveforms:

## 2014-08-22 ENCOUNTER — Other Ambulatory Visit: Payer: Self-pay | Admitting: *Deleted

## 2014-08-22 DIAGNOSIS — M542 Cervicalgia: Secondary | ICD-10-CM

## 2014-08-22 DIAGNOSIS — R42 Dizziness and giddiness: Secondary | ICD-10-CM

## 2014-08-27 ENCOUNTER — Ambulatory Visit (HOSPITAL_BASED_OUTPATIENT_CLINIC_OR_DEPARTMENT_OTHER): Payer: BLUE CROSS/BLUE SHIELD | Attending: Physician Assistant | Admitting: *Deleted

## 2014-08-27 VITALS — Ht 69.0 in | Wt 200.0 lb

## 2014-08-27 DIAGNOSIS — G4733 Obstructive sleep apnea (adult) (pediatric): Secondary | ICD-10-CM | POA: Diagnosis not present

## 2014-08-27 DIAGNOSIS — R0683 Snoring: Secondary | ICD-10-CM | POA: Diagnosis not present

## 2014-08-27 DIAGNOSIS — R5383 Other fatigue: Secondary | ICD-10-CM

## 2014-09-02 ENCOUNTER — Ambulatory Visit
Admission: RE | Admit: 2014-09-02 | Discharge: 2014-09-02 | Disposition: A | Discharge status: Home / Self Care | Payer: BLUE CROSS/BLUE SHIELD | Source: Ambulatory Visit | Attending: Neurology | Admitting: Neurology

## 2014-09-02 DIAGNOSIS — M542 Cervicalgia: Secondary | ICD-10-CM

## 2014-09-02 DIAGNOSIS — R42 Dizziness and giddiness: Secondary | ICD-10-CM

## 2014-09-03 ENCOUNTER — Telehealth: Payer: Self-pay | Admitting: Neurology

## 2014-09-03 NOTE — Telephone Encounter (Signed)
I got the appt canceled

## 2014-09-03 NOTE — Telephone Encounter (Signed)
Called and discussed MRI brain and c-spine results.  He has evidence of cervical stenosis and has intermittent neck pain.  He is not interested in PT.  I recommended flexeril for severe pain and to call us if symptoms worsen.  His labs indicated elevated vitamin B6 and he has since stopped taking multivitamin.  Recommended monitoring symptoms off vitamins and see if this has any impact, though benefit is probably low since there was no evidence of sensory neuronopathy on EMG.    Patient would like to cancel f/u in March and return to clinic as needed.  Shaleta Ruacho K. Posey Pronto, DO

## 2014-09-04 ENCOUNTER — Encounter: Payer: Self-pay | Admitting: Cardiology

## 2014-09-04 ENCOUNTER — Telehealth: Payer: Self-pay | Admitting: *Deleted

## 2014-09-04 ENCOUNTER — Telehealth: Payer: Self-pay | Admitting: Cardiology

## 2014-09-04 DIAGNOSIS — G4733 Obstructive sleep apnea (adult) (pediatric): Secondary | ICD-10-CM

## 2014-09-04 HISTORY — DX: Obstructive sleep apnea (adult) (pediatric): G47.33

## 2014-09-04 NOTE — Telephone Encounter (Signed)
Please let patient know that he has severe OSA with an AHI of 35/hr.  Please set up for CPAP titration.

## 2014-09-04 NOTE — Sleep Study (Addendum)
   NAME: Carl Lee DATE OF BIRTH:  10-14-1955 MEDICAL RECORD NUMBER [id number]  LOCATION: [facility]  PHYSICIAN: Danniel Tones R  DATE OF STUDY: 08/27/2014  SLEEP STUDY TYPE: Nocturnal Polysomnogram               REFERRING PHYSICIAN: Richardson Dopp T, PA-C  INDICATION FOR STUDY: snoring, excessive daytime fatigue  EPWORTH SLEEPINESS SCORE: 15 HEIGHT: 5\' 9"  (175.3 cm)  WEIGHT: 200 lb (90.719 kg)    Body mass index is 29.52 kg/(m^2).  NECK SIZE: 16.5 in.  MEDICATIONS: Reviewed in the chart  SLEEP ARCHITECTURE: The patient slept for a total of 304 minutes out of a total sleep time of 384 minutes with no slow wave sleep and 58 minutes of REM sleep.  The sleep efficiency was reduced at 73%.  The onset to sleep latency was 30 minutes and onset to REM sleep latency was prolonged at 204 minutes.    RESPIRATORY DATA: The patient had a total of 73 apneas all of which were obstructive except for 1 central apnea.  There were 103 obstructive hypopneas.  Most events occurred in the non supine position during NREM sleep.  The AHI was elevated at 35 events per hour consistent with severe obstructive sleep apnea/hypopnea syndrome.  There was moderate to severe snoring.    OXYGEN DATA: The lowest oxygen saturation during REM sleep was 81% and during NREM sleep was 82%.  The total time spent with Oxygen saturations below 88% was 20 minutes.    CARDIAC DATA: The patient maintained NSR with the average HR of 76bpm.  The maximum HR was 141bpm and mean HR 35bpm.  There were occasional PVC's and PAC's.    MOVEMENT/PARASOMNIA: There were increased periodic limb movements with an elevated PLMS index of 33.9 movements per hour.  There were no REM sleep behavior disorders.    IMPRESSION/ RECOMMENDATION:   1.  Severe Obstructive sleep apnea/hypopnea syndrome with an AHI of 35 events per hour.  Most events occurred in the non supine position during NREM sleep. 2.  Reduced sleep efficiency  with increased frequency of arousals due to respiratory events.   3.  Frequent oxygen desaturations due to respiratory events with O2 desaturations as low as 81%. 4.  Frequent periodic limb movements with a PLMS index of 33.9 movements per hour.   5.  Occasional PVC's and PAC's were noted.   6.  Recommend proceeding with CPAP titration given the degree of sleep disordered breathing with oxygen desaturation, severe snoring and patient's history of excessive daytime sleepiness. 7.  The patient should be counseled in good sleep hygiene and weight loss.  Signed: Sueanne Margarita Diplomate, American Board of Sleep Medicine  ELECTRONICALLY SIGNED ON:  09/04/2014, 10:45 AM [name] PH: [phone]   FX: [phone] [place]

## 2014-09-04 NOTE — Telephone Encounter (Signed)
Patient informed of results and verbal understanding expressed.  CPAP titration ordered for scheduling. Patient agrees with treatment plan.

## 2014-09-04 NOTE — Addendum Note (Signed)
Addended by: Harland German A on: 09/04/2014 11:53 AM   Modules accepted: Orders

## 2014-09-04 NOTE — Telephone Encounter (Addendum)
Refer to pulmonary for sleep apnea     ----- Message -----     From: Sueanne Margarita, MD     Sent: 09/04/2014 11:04 AM      To: Josue Hector, MD, Liliane Shi, PA-C        Just FYI patient has severe sleep apnea with AHI of 35/hr. My nurse will notify patient of results and we will set him up for CPAP titration        [place]  PT  AWARE  WILL FORWARD  TO  DR Stark PT  ALREADY  HAS  LUNG  MD  DR   [name] .Adonis Housekeeper

## 2014-09-04 NOTE — Telephone Encounter (Signed)
Left message to call back  

## 2014-09-05 ENCOUNTER — Ambulatory Visit (INDEPENDENT_AMBULATORY_CARE_PROVIDER_SITE_OTHER): Payer: BLUE CROSS/BLUE SHIELD | Admitting: Cardiothoracic Surgery

## 2014-09-05 ENCOUNTER — Ambulatory Visit
Admission: RE | Admit: 2014-09-05 | Discharge: 2014-09-05 | Disposition: A | Discharge status: Home / Self Care | Payer: BLUE CROSS/BLUE SHIELD | Source: Ambulatory Visit | Attending: Physical Medicine and Rehabilitation | Admitting: Physical Medicine and Rehabilitation

## 2014-09-05 ENCOUNTER — Other Ambulatory Visit: Payer: Self-pay | Admitting: Physical Medicine and Rehabilitation

## 2014-09-05 ENCOUNTER — Encounter: Payer: Self-pay | Admitting: Cardiothoracic Surgery

## 2014-09-05 VITALS — BP 118/74 | HR 53 | Resp 20 | Ht 69.0 in | Wt 205.0 lb

## 2014-09-05 DIAGNOSIS — M25559 Pain in unspecified hip: Secondary | ICD-10-CM

## 2014-09-05 DIAGNOSIS — Z951 Presence of aortocoronary bypass graft: Secondary | ICD-10-CM

## 2014-09-05 DIAGNOSIS — K225 Diverticulum of esophagus, acquired: Secondary | ICD-10-CM

## 2014-09-05 NOTE — Progress Notes (Signed)
[address]       [place] [address]             [phone]                    [name] [place] Medical Record #[id number] Date of Birth: Nov 11, 1955  Referring: Josue Hector, MD Primary Care: [name], MD  Chief Complaint:    Cough and sore throat- Zenkers   History of Present Illness:    Carl Lee 59 y.o. male is seen in the office  today for "Zenker's diverticulum". The patient gives a history of having heart surgery February 07 2014 by Dr [name]. At the time of surgery had a transesophageal echo probe placed,  postoperative course was complicated by early extubation requiring reintubation the first postop night. At the time of discharge she was actively being treated for COPD, postop wheezing. He notes initially after surgery he had trouble swallowing pills, however over the past 3 months this is improved. Patient has not been smoking for 3 months. He had a swallow evaluation and then a upper GI, suggesting upper esophageal sphincter dysfunction and Zenker's diverticulum-see x-ray reports He has had no manometrics or endoscopy performed.  The patient notes that his swallowing ability has stabilized since last seen. He does note trouble eating bread or toast with coughing. Since last seen he is also now on disability, has had extensive neurologic workup including MRI the brain because of dizziness which she blames on anesthesia.  Current Activity/ Functional Status:  Patient is independent with mobility/ambulation, transfers, ADL's, IADL's.   Zubrod Score: At the time of surgery this patient's most appropriate activity status/level should be described as: $RemoveBefor'[x]'kvWNtmAeDhCd$     0    Normal activity, no symptoms $RemoveBef'[]'cZwQXlMQbt$     1    Restricted in physical strenuous activity but ambulatory, able to do out light work $RemoveBe'[]'yrDbDgaNx$     2    Ambulatory and capable of self care, unable to do work activities, up and about               >50 % of waking hours                               '[]'$     3    Only limited self care, in bed greater than 50% of waking hours $RemoveBefo'[]'fpRHzxGMZrO$     4    Completely disabled, no self care, confined to bed or chair $Remove'[]'PSErvtq$     5    Moribund   Past Medical History  Diagnosis Date  . Bladder cancer 08/16/2008    [facility] [place])  . Hypertension   . AAA (abdominal aortic aneurysm) 03/25/12    s/p endovascular repair  . Hx of migraines   . Contact dermatitis     atypical [name])  . Childhood asthma   . Environmental allergies     improved as ages  . Diverticulosis 2013    severe by CT and colonoscopy  . Adenomatous polyp 06/2009  . Lumbosacral radiculopathy at S1 03/2012    left, with spinal stenosis (MRI 04/2013) improved with TF ESI L5/S1 and S1/2 ([facility] [place])  . Spinal stenosis     LS1 nerve root impingement from bulging disc  . Coronary artery disease   . Zenker diverticulum   . Resistance to clopidogrel 2015    drug metabolism panel  run - asked to scan  . OSA (obstructive sleep apnea) 09/04/2014    Severe with AHI 35/hr    Past Surgical History  Procedure Laterality Date  . Tonsillectomy and adenoidectomy  1973  . Vasectomy    . Bladder cancer      ([place]) x 2  . Abdominal aortic aneurysm repair  03/25/12  . Colonoscopy  06/2012    hyperplastic polyp, diverticulosis ([facility]) rec rpt 5 yrs  . Cystoscopy  08/16/08    Bladder Cancer  . Esi  04/2013, 06/2013    L L5S1, S12 transforaminal ESI (Dr.  Nickola Major)  . Abi  05/2013    WNL, L TBI low at 0.66  . Coronary artery bypass graft N/A 02/07/2014    Procedure: CORONARY ARTERY BYPASS GRAFTING (CABG);  Surgeon: Loreli Slot, MD;  Location: [facility] OR;  Service: Open Heart Surgery;  Laterality: N/A;  CABG X 3, BILATERAL LIMA, EVH  . Intraoperative transesophageal echocardiogram N/A 02/07/2014    Procedure: INTRAOPERATIVE TRANSESOPHAGEAL ECHOCARDIOGRAM;  Surgeon: Loreli Slot, MD;  Location: [facility] OR;  Service: Open Heart Surgery;  Laterality: N/A;  . Esi Left  04/2014, 05/2014, 06/2014    L5/S1, S1/2; rpt; L4/5  . Left heart catheterization with coronary angiogram N/A 02/01/2014    Procedure: LEFT HEART CATHETERIZATION WITH CORONARY ANGIOGRAM;  Surgeon: Kathleene Hazel, MD;  Location: [facility] CATH LAB;  Service: Cardiovascular;  Laterality: N/A;    Family History  Problem Relation Age of Onset  . Diabetes Mother   . Arrhythmia Mother     pacemaker  . Cancer Mother     Bladder  . COPD Mother   . Thyroid disease Mother   . Hyperlipidemia Mother   . Hypertension Mother   . Diabetes Father   . CAD Father 40    CHF, MI  . Dementia Father   . Diabetes Sister   . Colon cancer Neg Hx   . Diabetes Brother   . Hypertension Brother   . Heart attack Maternal Grandmother   . Heart attack Father     History   Social History  . Marital Status: Married    Spouse Name: N/A  . Number of Children: 2  . Years of Education: N/A   Occupational History  . Self Employed    Social History Main Topics  . Smoking status: Former Smoker -- 1.00 packs/day for 40 years    Types: Cigarettes    Quit date: 02/05/2014  . Smokeless tobacco: Never Used  . Alcohol Use: 0.0 oz/week    0 Standard drinks or equivalent per week     Comment: Rare- states 1 oz. per week  . Drug Use: No  . Sexual Activity: Not on file   Other Topics Concern  . Not on file   Social History Narrative   Caffeine: 1 cup coffee/day   Lives with wife, 1 dog   grown children   Occupation: Optician, dispensing - self employed.  Not working, applied for disability.    Edu: 2 yrs college   Activity: fishing, walking occasionally   Diet: good water, fruits/vegetables daily    History  Smoking status  . Former Smoker -- 1.00 packs/day for 40 years  . Types: Cigarettes  . Quit date: 02/05/2014  Smokeless tobacco  . Never Used    History  Alcohol Use  . 0.0 oz/week  . 0 Standard drinks or equivalent per week    Comment: Rare- states 1 oz. per week  Allergies    Allergen Reactions  . Codeine Other (See Comments)    Nightmares    Current Outpatient Prescriptions  Medication Sig Dispense Refill  . aspirin EC 325 MG EC tablet Take 1 tablet (325 mg total) by mouth daily.    [name] atenolol (TENORMIN) 25 MG tablet Take 1 tablet (25 mg total) by mouth daily. 90 tablet 3  . atorvastatin (LIPITOR) 20 MG tablet Take 1 tablet (20 mg total) by mouth daily at 6 PM. 90 tablet 1  . Blood Glucose Monitoring Suppl (ONE TOUCH ULTRA SYSTEM KIT) W/DEVICE KIT 1 kit by Does not apply route once. 1 each 0  . cyclobenzaprine (FLEXERIL) 5 MG tablet Take 1 tablet (5 mg total) by mouth 3 (three) times daily as needed (migraine). 30 tablet 1  . Diclofenac Sodium POWD   0  . DULERA 100-5 MCG/ACT AERO Inhale 2 puffs into the lungs as needed.     . famotidine (PEPCID) 20 MG tablet Take 1 tablet (20 mg total) by mouth 2 (two) times daily. 180 tablet 3  . Glucosamine-Chondroit-Vit C-Mn (GLUCOSAMINE 1500 COMPLEX PO) Take 2 tablets by mouth daily.    [name] glucose blood test strip check once daily and as needed 100 each 3  . hydrochlorothiazide (MICROZIDE) 12.5 MG capsule TAKE 1 CAPSULE (12.5 MG TOTAL) BY MOUTH DAILY. 30 capsule 6  . HYDROcodone-acetaminophen (NORCO/VICODIN) 5-325 MG per tablet Take 1 tablet by mouth every 8 (eight) hours as needed for severe pain. 20 tablet 0  . levalbuterol (XOPENEX HFA) 45 MCG/ACT inhaler Inhale 1-2 puffs into the lungs every 6 (six) hours as needed for wheezing. 1 Inhaler 12  . lisinopril (PRINIVIL,ZESTRIL) 20 MG tablet Take 1 tablet (20 mg total) by mouth daily. 90 tablet 3  . ONETOUCH DELICA LANCETS 03J MISC Check blood sugar once a day and as directed. Dx. E11.8 100 each 3  . vitamin B-12 (CYANOCOBALAMIN) 1000 MCG tablet Take 1 tablet (1,000 mcg total) by mouth daily.     No current facility-administered medications for this visit.     Review of Systems:     Cardiac Review of Systems: Y or N  Chest Pain [  n  ]  Resting SOB [n   ] Exertional  SOB  [n  ]  Orthopnea [ n ]   Pedal Edema [n   ]    Palpitations Florencio.Farrier ] Syncope  [ n ]   Presyncope [ n  ]  General Review of Systems: [Y] = yes [  ]=no Constitional: recent weight change [ n ];  Wt loss over the last 3 months [   ] anorexia [  ]; fatigue [  ]; nausea [  ]; night sweats [  ]; fever [  ]; or chills [  ];          Dental: poor dentition[  ]; Last Dentist visit:   Eye : blurred vision [  ]; diplopia [   ]; vision changes [  ];  Amaurosis fugax[  ]; Resp: cough [  ];  wheezing[y  ];  hemoptysis[  ]; shortness of breath[  ]; paroxysmal nocturnal dyspnea[  ]; dyspnea on exertion[  ]; or orthopnea[  ];  GI:  gallstones[n  ], vomiting[ n ];  dysphagia[ y ]; melena[  ];  hematochezia [  ]; heartburn[  ];   Hx of  Colonoscopy[ y ]; GU: kidney stones [  ]; hematuria[  ];   dysuria [  ];  nocturia[  ];  history of     obstruction [  ]; urinary frequency [  ]             Skin: rash, swelling[  ];, hair loss[  ];  peripheral edema[  ];  or itching[  ]; Musculosketetal: myalgias[  ];  joint swelling[  ];  joint erythema[  ];  joint pain[  ];  back pain[  ];  Heme/Lymph: bruising[  ];  bleeding[  ];  anemia[  ];  Neuro: TIA[  ];  headaches[  ];  stroke[  ];  vertigo[  ];  seizures[  ];   paresthesias[  ];  difficulty walking[  ];  Psych:depression[  ]; anxiety[  ];  Endocrine: diabetes[  ];  thyroid dysfunction[  ];  Immunizations: Flu up to date [ Y ]; Pneumococcal up to date Gilian.Kraft  ];  Other:  Physical Exam: BP 118/74 mmHg  Pulse 53  Resp 20  Ht 5\' 9"  (1.753 m)  Wt 205 lb (92.987 kg)  BMI 30.26 kg/m2  SpO2 96%  PHYSICAL EXAMINATION:  General appearance: alert, cooperative and appears older than stated age Neurologic: intact Heart: regular rate and rhythm, S1, S2 normal, no murmur, click, rub or gallop Lungs: clear to auscultation bilaterally Abdomen: soft, non-tender; bowel sounds normal; no masses,  no organomegaly Extremities: extremities normal, atraumatic, no cyanosis or edema and  Homans sign is negative, no sign of DVT Wound: Sternal incision is well-healed Patient has no cervical or supraclavicular adenopathy no neck tenderness, on digital exam of the mouth there are no palpable masses appreciated on the tongue, visual exam of the oral cavity reveals no abnormality  Diagnostic Studies & Laboratory data:     Recent Radiology Findings:  Dg Chest 2 View  04/16/2014   CLINICAL DATA:  Coronary artery disease. COPD and hypertension. Former smoker.  EXAM: CHEST  2 VIEW  COMPARISON:  03/19/2014  FINDINGS: Sternotomy wires are unchanged. Lungs are adequately inflated without consolidation or effusion. There is flattening of the hemidiaphragms on the lateral film. Cardiomediastinal silhouette and remainder of the exam is unchanged.  IMPRESSION: No active cardiopulmonary disease.   Electronically Signed   By: 05/19/2014 M.D.   On: 04/16/2014 10:20   Dg Esophagus  04/25/2014   CLINICAL DATA:  Difficulty swallowing pills and food. Choking sensation mild eating. Reflux. Cough.  EXAM: ESOPHOGRAM / BARIUM SWALLOW / BARIUM TABLET STUDY  TECHNIQUE: Combined double contrast and single contrast examination performed using effervescent crystals, thick barium liquid, and thin barium liquid. The patient was observed with fluoroscopy swallowing a 56mm barium sulphate tablet.  FLUOROSCOPY TIME:  1 min, 26 seconds  COMPARISON:  None.  FINDINGS: Pharyngeal phase of swallowing reveals a small Zenker' s diverticulum as on image 9 series 2.  Occasional tertiary contractions noted in the esophagus. Primary peristaltic waves in the esophagus uninterrupted on 3 out of 4 swallows, within normal limits.  No esophageal ulceration or significant constriction identified. A 13 mm barium tablet passed briskly to the stomach, but the 80 listed the patient has feelings of globus sensation for up to 30 seconds afterwards.  IMPRESSION: 1. Small Zenker's diverticulum. 2. The patient experienced globus sensation after  swallowing the barium pill, even though the barium pill passed briskly into the stomach. No esophageal stricture identified.   Electronically Signed   By: 19m M.D.   On: 04/25/2014 14:13        Recent Lab Findings: Lab Results  Component Value Date  WBC 10.2 06/13/2014   HGB 15.5 06/13/2014   HCT 47.2 06/13/2014   PLT 263.0 06/13/2014   GLUCOSE 92 06/10/2014   CHOL 140 04/15/2014   TRIG 168.0* 04/15/2014   HDL 40.70 04/15/2014   LDLDIRECT 105 01/05/2012   LDLCALC 66 04/15/2014   ALT 10 04/15/2014   AST 18 04/15/2014   NA 136 06/10/2014   K 3.7 06/10/2014   CL 100 06/10/2014   CREATININE 0.8 06/10/2014   BUN 22 06/10/2014   CO2 27 06/10/2014   TSH 1.50 06/17/2014   INR 1.23 02/07/2014   HGBA1C 6.8* 06/13/2014      Assessment / Plan:  Patient with probable upper esophageal sphincter dysfunction and small Zenker's diverticulum  I have again offered the patient to proceed with evaluation of this esophageal problems with upper GI endoscopy, ENT referral and esophageal manometrics. He notes that the symptoms are better than they had been and he prefers not to proceed with this now. He notes he prefers not to proceed with any further evaluation to this point but will call me back if he has increasing difficulties with swallowing and wishes to proceed with further evaluation.   Grace Isaac MD      [address] [place] [address] Office [phone]   Beeper [phone]  09/05/2014 10:40 AM

## 2014-09-09 ENCOUNTER — Encounter: Payer: Self-pay | Admitting: Family Medicine

## 2014-09-09 DIAGNOSIS — E538 Deficiency of other specified B group vitamins: Secondary | ICD-10-CM | POA: Insufficient documentation

## 2014-09-11 DIAGNOSIS — Z0271 Encounter for disability determination: Secondary | ICD-10-CM

## 2014-09-17 ENCOUNTER — Ambulatory Visit: Payer: BLUE CROSS/BLUE SHIELD | Admitting: Neurology

## 2014-09-18 ENCOUNTER — Ambulatory Visit (HOSPITAL_BASED_OUTPATIENT_CLINIC_OR_DEPARTMENT_OTHER): Payer: BLUE CROSS/BLUE SHIELD | Attending: Cardiology | Admitting: *Deleted

## 2014-09-18 VITALS — Ht 68.0 in | Wt 205.0 lb

## 2014-09-18 DIAGNOSIS — G4733 Obstructive sleep apnea (adult) (pediatric): Secondary | ICD-10-CM | POA: Insufficient documentation

## 2014-09-23 ENCOUNTER — Ambulatory Visit (INDEPENDENT_AMBULATORY_CARE_PROVIDER_SITE_OTHER): Payer: BLUE CROSS/BLUE SHIELD | Admitting: Family Medicine

## 2014-09-23 ENCOUNTER — Encounter: Payer: Self-pay | Admitting: Family Medicine

## 2014-09-23 VITALS — BP 104/60 | HR 60 | Temp 98.1°F | Wt 205.2 lb

## 2014-09-23 DIAGNOSIS — M5417 Radiculopathy, lumbosacral region: Secondary | ICD-10-CM

## 2014-09-23 DIAGNOSIS — R42 Dizziness and giddiness: Secondary | ICD-10-CM

## 2014-09-23 DIAGNOSIS — E538 Deficiency of other specified B group vitamins: Secondary | ICD-10-CM

## 2014-09-23 DIAGNOSIS — E118 Type 2 diabetes mellitus with unspecified complications: Secondary | ICD-10-CM

## 2014-09-23 DIAGNOSIS — G4733 Obstructive sleep apnea (adult) (pediatric): Secondary | ICD-10-CM

## 2014-09-23 DIAGNOSIS — Z951 Presence of aortocoronary bypass graft: Secondary | ICD-10-CM

## 2014-09-23 DIAGNOSIS — I10 Essential (primary) hypertension: Secondary | ICD-10-CM

## 2014-09-23 LAB — VITAMIN B12: Vitamin B-12: 541 pg/mL (ref 211–911)

## 2014-09-23 LAB — HEMOGLOBIN A1C: Hgb A1c MFr Bld: 6.2 % (ref 4.6–6.5)

## 2014-09-23 MED ORDER — HYDROCHLOROTHIAZIDE 12.5 MG PO CAPS: ORAL_CAPSULE | ORAL | Status: DC

## 2014-09-23 MED ORDER — LISINOPRIL 20 MG PO TABS: 20.0000 mg | ORAL_TABLET | Freq: Every day | ORAL | Status: DC

## 2014-09-23 MED ORDER — CYANOCOBALAMIN 1000 MCG/ML IJ SOLN
1000.0000 ug | Freq: Once | INTRAMUSCULAR | Status: AC
  Administered 2014-09-23: 1000 ug via INTRAMUSCULAR

## 2014-09-23 MED ORDER — ATORVASTATIN CALCIUM 20 MG PO TABS: 20.0000 mg | ORAL_TABLET | Freq: Every day | ORAL | Status: DC

## 2014-09-23 NOTE — Assessment & Plan Note (Signed)
Persistent. Cardiac and neurological workup stable. Continue to monitor.

## 2014-09-23 NOTE — Patient Instructions (Addendum)
If you haven't heard about your sleep study this week, call me to refer you to sleep doctor.  Blood work today then B12 shot.  Good to see you today, call us with questions. Return as needed or in 6 months for wellness visit.

## 2014-09-23 NOTE — Assessment & Plan Note (Signed)
Pending ablation. Wants to avoid surgery.

## 2014-09-23 NOTE — Assessment & Plan Note (Addendum)
No CPAP yet. Still waiting to hear about results of CPAP titration study. Advised if not heard from cards office this week to notify us for referral to pulm.

## 2014-09-23 NOTE — Assessment & Plan Note (Signed)
Stable average cbg readings. Diet controlled.

## 2014-09-23 NOTE — Assessment & Plan Note (Signed)
Stable on current regimen. Continue. ?

## 2014-09-23 NOTE — Assessment & Plan Note (Addendum)
Recheck B12 level then B12 shot today.  Also found to have elevated B6 level. He has stopped MVI. Recheck today.

## 2014-09-23 NOTE — Progress Notes (Signed)
BP 104/60 mmHg  Pulse 60  Temp(Src) 98.1 F (36.7 C) (Oral)  Wt 205 lb 4 oz (93.101 kg)   CC: 6 mo f/u visit  Subjective:    Patient ID: Carl Lee, male    DOB: Sep 11, 1955, 59 y.o.   MRN: [id number]  HPI: Carl Lee is a 59 y.o. male presenting on 09/23/2014 for Follow-up   Since last seen here, seen by CTS for zenker diverticulum, decided against further eval. Also seen by neurology for headache, fatigue and imbalance, found to have distal predominant large fiber peripheral neuropathy possibly due to B12 def. Recommended sleep evaluation to r/o OSA. EMG - L ulnar neuropathy demyelinating, no general sensorimotor polyneuropathy. MRI brain and C-spine showed cervical stenosis with no acute findings. Sleep study revealed severe OSA with AHI of 35/hr, started on CPAP, rec referral to sleep doctor.   Staying fatigued/lightheaded - metoprolol changed to atenolol. Pt feels it is anesthesia from CABG. Prior surgery took 1 year to resolve completely. Occasional migraines.   BP has been running well controlled on average.   Diet controlled diabetes - checking sugars occasionally - fasting in am 30d average is 114.   Has applied for disability for 1 year. Approved.   Lab Results  Component Value Date   [id number] 157 06/28/2014   Lab Results  Component Value Date   HGBA1C 6.8* 06/13/2014    Relevant past medical, surgical, family and social history reviewed and updated as indicated. Interim medical history since our last visit reviewed. Allergies and medications reviewed and updated. Current Outpatient Prescriptions on File Prior to Visit  Medication Sig  . aspirin EC 325 MG EC tablet Take 1 tablet (325 mg total) by mouth daily.  [name] atenolol (TENORMIN) 25 MG tablet Take 1 tablet (25 mg total) by mouth daily.  . Blood Glucose Monitoring Suppl (ONE TOUCH ULTRA SYSTEM KIT) W/DEVICE KIT 1 kit by Does not apply route once.  . cyclobenzaprine (FLEXERIL) 5 MG tablet Take 1 tablet (5 mg  total) by mouth 3 (three) times daily as needed (migraine).  . Diclofenac Sodium POWD   . DULERA 100-5 MCG/ACT AERO Inhale 2 puffs into the lungs as needed.   . famotidine (PEPCID) 20 MG tablet Take 1 tablet (20 mg total) by mouth 2 (two) times daily.  . Glucosamine-Chondroit-Vit C-Mn (GLUCOSAMINE 1500 COMPLEX PO) Take 2 tablets by mouth daily.  [name] glucose blood test strip check once daily and as needed  . HYDROcodone-acetaminophen (NORCO/VICODIN) 5-325 MG per tablet Take 1 tablet by mouth every 8 (eight) hours as needed for severe pain.  [name] levalbuterol (XOPENEX HFA) 45 MCG/ACT inhaler Inhale 1-2 puffs into the lungs every 6 (six) hours as needed for wheezing.  Glory Rosebush DELICA LANCETS 26O MISC Check blood sugar once a day and as directed. Dx. E11.8  . vitamin B-12 (CYANOCOBALAMIN) 1000 MCG tablet Take 1 tablet (1,000 mcg total) by mouth daily.   No current facility-administered medications on file prior to visit.    Review of Systems Per HPI unless specifically indicated above     Objective:    BP 104/60 mmHg  Pulse 60  Temp(Src) 98.1 F (36.7 C) (Oral)  Wt 205 lb 4 oz (93.101 kg)  Wt Readings from Last 3 Encounters:  09/23/14 205 lb 4 oz (93.101 kg)  09/18/14 205 lb (92.987 kg)  09/05/14 205 lb (92.987 kg)    Physical Exam  Constitutional: He appears well-developed and well-nourished. No distress.  HENT:  Mouth/Throat: Oropharynx is  clear and moist. No oropharyngeal exudate.  Cardiovascular: Normal rate, regular rhythm, normal heart sounds and intact distal pulses.   No murmur heard. Pulmonary/Chest: Effort normal and breath sounds normal. No respiratory distress. He has no wheezes. He has no rales.  Musculoskeletal: He exhibits no edema.  Nursing note and vitals reviewed.  Results for orders placed or performed in visit on 07/17/14  Copper, serum  Result Value Ref Range   Copper 107 70 - 175 mcg/dL  Ceruloplasmin  Result Value Ref Range   Ceruloplasmin 26 18 - 36  mg/dL  Vitamin B1  Result Value Ref Range   Vitamin B1 (Thiamine) 18 8 - 30 nmol/L  Vitamin B6  Result Value Ref Range   Vitamin B6 60.2 (H) 2.1 - 21.7 ng/mL  SPEP & IFE with QIG  Result Value Ref Range   IgG (Immunoglobin G), Serum 629 (L) 650 - 1600 mg/dL   IgA 113 68 - 379 mg/dL   IgM, Serum 107 41 - 251 mg/dL   Immunofix Electr Int SEE NOTE    Total Protein, Serum Electrophoresis 6.6 6.0 - 8.3 g/dL   Albumin ELP 61.4 55.8 - 66.1 %   Alpha-1-Globulin 4.1 2.9 - 4.9 %   Alpha-2-Globulin 12.4 (H) 7.1 - 11.8 %   Beta Globulin 7.7 (H) 4.7 - 7.2 %   Beta 2 4.1 3.2 - 6.5 %   Gamma Globulin 10.3 (L) 11.1 - 18.8 %   M-Spike, % NOT DET g/dL   SPE Interp. SEE NOTE    COMMENT (PROTEIN ELECTROPHOR) SEE NOTE   IFE, Urine (with Tot Prot)  Result Value Ref Range   Total Protein, Urine <4 (L) 5 - 25 mg/dL   Total Protein, Urine-Ur/day NOT CALC <150 mg/day   Albumin, U DETECTED DETECTED   Alpha 1, Urine DETECTED (A) NONE DET   Alpha 2, Urine DETECTED (A) NONE DET   Beta, Urine DETECTED (A) NONE DET   Gamma Globulin, Urine DETECTED (A) NONE DET   Interpretation SEE NOTE       Assessment & Plan:   Problem List Items Addressed This Visit    Vitamin B12 deficiency    Recheck B12 level then B12 shot today.  Also found to have elevated B6 level. He has stopped MVI. Recheck today.      Relevant Orders   Vitamin B12   Vitamin B6   S/P CABG x 3    With post CABG chest wall pain.       OSA (obstructive sleep apnea) - Primary    No CPAP yet. Still waiting to hear about results of CPAP titration study. Advised if not heard from cards office this week to notify us for referral to pulm.       Lumbosacral radiculopathy at S1    Pending ablation. Wants to avoid surgery.      Lightheadedness    Persistent. Cardiac and neurological workup stable. Continue to monitor.      Hypertension    Stable on current regimen. Continue.      Relevant Medications   atorvastatin (LIPITOR) tablet     lisinopril (PRINIVIL,ZESTRIL) tablet   hydrochlorothiazide (MICROZIDE) 12.5 MG capsule   Diabetes mellitus type 2, controlled, with complications    Stable average cbg readings. Diet controlled.      Relevant Medications   atorvastatin (LIPITOR) tablet   lisinopril (PRINIVIL,ZESTRIL) tablet   Other Relevant Orders   Hemoglobin A1c       Follow up plan: Return in about  6 months (around 03/26/2015), or as needed, for annual exam, prior fasting for blood work.

## 2014-09-23 NOTE — Progress Notes (Signed)
Pre visit review using our clinic review tool, if applicable. No additional management support is needed unless otherwise documented below in the visit note. 

## 2014-09-23 NOTE — Addendum Note (Signed)
Addended by: Royann Shivers A on: 09/23/2014 09:59 AM   Modules accepted: Orders

## 2014-09-23 NOTE — Assessment & Plan Note (Signed)
With post CABG chest wall pain.

## 2014-09-25 ENCOUNTER — Other Ambulatory Visit: Payer: Self-pay | Admitting: Family Medicine

## 2014-09-26 ENCOUNTER — Encounter: Payer: Self-pay | Admitting: Family Medicine

## 2014-09-26 LAB — VITAMIN B6: VITAMIN B6: 7.2 ng/mL (ref 2.1–21.7)

## 2014-09-27 ENCOUNTER — Encounter: Payer: Self-pay | Admitting: Family Medicine

## 2014-10-08 ENCOUNTER — Telehealth: Payer: Self-pay | Admitting: Cardiology

## 2014-10-08 ENCOUNTER — Telehealth: Payer: Self-pay | Admitting: Family Medicine

## 2014-10-08 NOTE — Telephone Encounter (Signed)
Looks like Dr Radford Pax ordered CPAP and [facility] should be coming out to set this up. Call Dr Theodosia Blender CMA Romelle Starcher if this does not happen in the next week.

## 2014-10-08 NOTE — Telephone Encounter (Signed)
Please let patient know that he had successful  CPAP titration and set up 10 week OV with me.  Please let [facility] know that orders are in [facility]

## 2014-10-08 NOTE — Telephone Encounter (Signed)
Pt called checking on his referral with a lung sleep doctor

## 2014-10-08 NOTE — Telephone Encounter (Addendum)
Patient is aware of results and is scheduled for a F/U on 12/17/14 I told him if he needed anything between now and then to give me a call and I would help him out  Message has been sent to [facility]

## 2014-10-08 NOTE — Addendum Note (Signed)
Addended by: Fransico Him R on: 10/08/2014 11:14 AM   Modules accepted: Orders

## 2014-10-08 NOTE — Sleep Study (Signed)
   NAME: Carl Lee DATE OF BIRTH:  [birth year] MEDICAL RECORD NUMBER [id number]  [facility]  PHYSICIAN: TURNER,TRACI R  DATE OF STUDY: 09/18/2014  SLEEP STUDY TYPE: Positive Airway Pressure Titration               REFERRING PHYSICIAN: Sueanne Margarita, MD  INDICATION FOR STUDY: Obstructive sleep apnea  EPWORTH SLEEPINESS SCORE: 15 HEIGHT: 5\' 8"  (172.7 cm)  WEIGHT: 205 lb (92.987 kg)    Body mass index is 31.18 kg/(m^2).  NECK SIZE: 16.5 in.  MEDICATIONS: Reviewed in the chart  SLEEP ARCHITECTURE: The patient slept for a total of 209 minutes out of a total sleep period of 309 minutes.  There was 50 minutes of REM sleep and no slow wave sleep.  The onset to sleep latency was prolonged at 98 minutes and the latency to REM sleep onset was prolonged at 120 minutes.  The sleep efficiency was reduced at 51%.  There was an increased number of arousals primarily from spontaneous events.  RESPIRATORY DATA: The patient was started on CPAP at 5cm H2O and pressure was increased for respiratory events and snoring.  The patient was able to attain an optimum pressure of 13cm H2O in REM sleep without any further respiratory events.  He was not able to reach REM supine sleep.  The AHI at 13cm H2O was 0.  Snoring was eliminated with CPAP.  OXYGEN DATA: The lowest oxygen saturation was 81% during NREM sleep and the average oxygen saturation was 93%.  The time spent with oxygen saturations below 88% was 3.7 minutes.    CARDIAC DATA: The patient maintained NSR with PAC's and PVC's.  The average heart rate was 68bpm.  The lowest heart rate was 38bpm and the maximum heart rate was 171bpm.  MOVEMENT/PARASOMNIA: There were an increased number of periodic limb movements during sleep with an elevated PLMS index of 78 movements per hour.  There were no REM behavior sleep disorders.  IMPRESSION/ RECOMMENDATION:   1.  Successful CPAP titration to 13cm H2O.   The patient was able to  attain an optimum pressure of 13cm H2O in REM sleep without any further respiratory events. 2.  Snoring was eliminated with CPAP. 3.  Reduced sleep efficiency with increased frequency of spontaneous arousals. 4.  Abnormal sleep architecture with no slow wave sleep. 5.  Periodic limb movements were noted during the titration.l 6.  NSR with PAC's and PVC's were noted. 7.  Recommend ResMed CPAP device with heated humidifier, CPAP setting of 13cm H2O and ResMed AirFit nasal pillow mask with chin strap. 8.  The patient should be counseled in good sleep hygiene and weight loss.  Signed: Sueanne Margarita Diplomate, American Board of Sleep Medicine  ELECTRONICALLY SIGNED ON:  10/08/2014, 11:01 AM [place] PH: [phone]   FX: [phone] [place]

## 2014-10-09 NOTE — Telephone Encounter (Signed)
Patient notified and verbalized understanding. 

## 2014-10-30 ENCOUNTER — Encounter: Payer: Self-pay | Admitting: Pulmonary Disease

## 2014-10-30 ENCOUNTER — Ambulatory Visit (INDEPENDENT_AMBULATORY_CARE_PROVIDER_SITE_OTHER): Payer: BLUE CROSS/BLUE SHIELD | Admitting: Pulmonary Disease

## 2014-10-30 VITALS — BP 138/68 | HR 68 | Ht 71.0 in | Wt 210.0 lb

## 2014-10-30 DIAGNOSIS — J449 Chronic obstructive pulmonary disease, unspecified: Secondary | ICD-10-CM | POA: Diagnosis not present

## 2014-10-30 DIAGNOSIS — R0782 Intercostal pain: Secondary | ICD-10-CM | POA: Diagnosis not present

## 2014-10-30 DIAGNOSIS — R079 Chest pain, unspecified: Secondary | ICD-10-CM

## 2014-10-30 DIAGNOSIS — G8929 Other chronic pain: Secondary | ICD-10-CM | POA: Insufficient documentation

## 2014-10-30 MED ORDER — MOMETASONE FURO-FORMOTEROL FUM 100-5 MCG/ACT IN AERO: 2.0000 | INHALATION_SPRAY | Freq: Two times a day (BID) | RESPIRATORY_TRACT | Status: DC

## 2014-10-30 NOTE — Patient Instructions (Signed)
Take Dulera twice a day no matter how you feel Use albuterol as needed We will check a chest x-ray and call you with the results Follow-up in 6 months or sooner if needed

## 2014-10-30 NOTE — Progress Notes (Signed)
Subjective:    Patient ID: Carl Lee, male    DOB: [birth year], 59 y.o.   MRN: [id number]  Synopsis: Carl Lee had a CABG in 2015 and pulmonary critical care medicine was consulted for a COPD exacerbation. July 2015 lung function testing showed a ratio 62%, FEV1 of 2.10 L (56% predicted), total lung capacity 6.35 L (80% predicted). He smoked for many years and quit smoking in 2015.  HPI  Chief Complaint  Patient presents with  . Follow-up    Pt c/o increased sob with chest discomfort, worse in mornings and with exertion.  Pt has a nonprod cough.  Has been on cpap machine X2 weeks, monitored by cardiologist.  pt has had a positive experience with this thus far.    Carl Lee still says that he has some shortness of breath from time to time. He describes tenderness in his chest which is substernal to the left shoulder.  He has worsening pain when he takes a deep breath.  Getting up and moving around helps.  He stared on CPAP since the last visit and he says that he feels better now.  He cut the grass recnetly without dyspnea.  He has minimal dyspnea with exertion unless he is carrying something heavy.  He recovers quickly.  He continues to use [facility], but he is using it as needed.   Past Medical History  Diagnosis Date  . Bladder cancer 08/16/2008    [facility] [place])  . Hypertension   . AAA (abdominal aortic aneurysm) 03/25/12    s/p endovascular repair  . Hx of migraines   . Contact dermatitis     atypical [name])  . Childhood asthma   . Environmental allergies     improved as ages  . Diverticulosis 2013    severe by CT and colonoscopy  . Adenomatous polyp 06/2009  . Lumbosacral radiculopathy at S1 03/2012    left, with spinal stenosis (MRI 04/2013) improved with TF ESI L5/S1 and S1/2 ([facility] [place])  . Spinal stenosis     LS1 nerve root impingement from bulging disc  . Coronary artery disease   . Zenker diverticulum   . Resistance to clopidogrel 2015   drug metabolism panel run - asked to scan  . OSA (obstructive sleep apnea) 09/04/2014    Severe with AHI 35/hr  . Vitamin B12 deficiency   . Diabetes mellitus type 2, controlled, with complications 90/38/3338    CAD      Review of Systems  Constitutional: Positive for fatigue. Negative for fever and chills.  HENT: Negative for postnasal drip, rhinorrhea and sinus pressure.   Respiratory: Positive for cough. Negative for shortness of breath and wheezing.   Cardiovascular: Negative for chest pain, palpitations and leg swelling.       Objective:   Physical Exam Filed Vitals:   10/30/14 1629  BP: 138/68  Pulse: 68  Height: $Remove'5\' 11"'ToDmplC$  (1.803 m)  Weight: 210 lb (95.255 kg)  SpO2: 95%   RA  Gen: well appearing, no acute distress HEENT: NCAT, EOMi, OP clear,  PULM: CTA B CV: RRR, no mgr, no JVD GI: BS+, soft, nontender, MSK: normal bulk and tone Derm: no rash or skin breakdown Neuro: A&Ox4, CN II-XII intact, MAEW     Assessment & Plan:   COPD, moderate This has been a stable interval but [name] would do better if he took the [facility] appropriately.    Plan: -instructed to take Dulera bid and not prn -use albuterol prn -  f/u 6 months   Chest pain This has been a constant problem for him since surgery. He does have some pleuritic pain. His lungs are clear on exam and there is no palpable tenderness. I explained to him that this is likely a mild inflammatory pleuritic process which should continue to improve.  Plan: -Check chest x-ray to ensure there is no evidence of a pleural effusion or broken rib.     Updated Medication List Outpatient Encounter Prescriptions as of 10/30/2014  Medication Sig  . aspirin EC 325 MG EC tablet Take 1 tablet (325 mg total) by mouth daily.  [name] atenolol (TENORMIN) 25 MG tablet Take 1 tablet (25 mg total) by mouth daily.  [name] atorvastatin (LIPITOR) 20 MG tablet Take 1 tablet (20 mg total) by mouth daily at 6 PM.  . Blood Glucose Monitoring Suppl  (ONE TOUCH ULTRA SYSTEM KIT) W/DEVICE KIT 1 kit by Does not apply route once.  . cyclobenzaprine (FLEXERIL) 5 MG tablet Take 1 tablet (5 mg total) by mouth 3 (three) times daily as needed (migraine).  . Diclofenac Sodium POWD   . famotidine (PEPCID) 20 MG tablet Take 1 tablet (20 mg total) by mouth 2 (two) times daily.  . Glucosamine-Chondroit-Vit C-Mn (GLUCOSAMINE 1500 COMPLEX PO) Take 2 tablets by mouth daily.  [name] glucose blood test strip check once daily and as needed  . hydrochlorothiazide (MICROZIDE) 12.5 MG capsule TAKE 1 CAPSULE (12.5 MG TOTAL) BY MOUTH DAILY.  [name] HYDROcodone-acetaminophen (NORCO/VICODIN) 5-325 MG per tablet Take 1 tablet by mouth every 8 (eight) hours as needed for severe pain.  [name] levalbuterol (XOPENEX HFA) 45 MCG/ACT inhaler Inhale 1-2 puffs into the lungs every 6 (six) hours as needed for wheezing.  [name] lisinopril (PRINIVIL,ZESTRIL) 20 MG tablet TAKE 1 TABLET BY MOUTH DAILY  . mometasone-formoterol (DULERA) 100-5 MCG/ACT AERO Inhale 2 puffs into the lungs 2 (two) times daily.  Glory Rosebush DELICA LANCETS 18A MISC Check blood sugar once a day and as directed. Dx. E11.8  . vitamin B-12 (CYANOCOBALAMIN) 1000 MCG tablet Take 1 tablet (1,000 mcg total) by mouth daily.  . [DISCONTINUED] DULERA 100-5 MCG/ACT AERO Inhale 2 puffs into the lungs as needed.   . [DISCONTINUED] lisinopril (PRINIVIL,ZESTRIL) 20 MG tablet Take 1 tablet (20 mg total) by mouth daily. (Patient not taking: Reported on 10/30/2014)

## 2014-10-30 NOTE — Assessment & Plan Note (Signed)
This has been a constant problem for him since surgery. He does have some pleuritic pain. His lungs are clear on exam and there is no palpable tenderness. I explained to him that this is likely a mild inflammatory pleuritic process which should continue to improve.  Plan: -Check chest x-ray to ensure there is no evidence of a pleural effusion or broken rib.

## 2014-10-30 NOTE — Assessment & Plan Note (Signed)
This has been a stable interval but Carl Lee would do better if he took the [facility] appropriately.    Plan: -instructed to take Dulera bid and not prn -use albuterol prn -f/u 6 months

## 2014-10-31 ENCOUNTER — Ambulatory Visit (INDEPENDENT_AMBULATORY_CARE_PROVIDER_SITE_OTHER)
Admission: RE | Admit: 2014-10-31 | Discharge: 2014-10-31 | Disposition: A | Discharge status: Home / Self Care | Payer: BLUE CROSS/BLUE SHIELD | Source: Ambulatory Visit | Attending: Pulmonary Disease | Admitting: Pulmonary Disease

## 2014-10-31 DIAGNOSIS — R0782 Intercostal pain: Secondary | ICD-10-CM

## 2014-11-04 ENCOUNTER — Telehealth: Payer: Self-pay | Admitting: *Deleted

## 2014-11-04 NOTE — Telephone Encounter (Signed)
PA request for Dulera 100 mcg submitted to Covermymeds Key: [id number] Please allow up to 3 business day for decision.

## 2014-11-07 NOTE — Telephone Encounter (Signed)
Dr [name] please advise which alternative med you'd like to put the pt on, or if we need to initiate an appeal.  Thanks!

## 2014-11-07 NOTE — Telephone Encounter (Signed)
Called pt and LMTCB x1 

## 2014-11-07 NOTE — Telephone Encounter (Signed)
Advair 

## 2014-11-07 NOTE — Telephone Encounter (Signed)
PA for Carl Lee has been denied. Pt must try and fail Advair and Breo Ellipta as those are alternatives on patient's formulary. An appeal can be filed if you like.

## 2014-11-07 NOTE — Telephone Encounter (Signed)
250/50 twice a day

## 2014-11-07 NOTE — Telephone Encounter (Signed)
Dr [name] -Please clarify what dose of Advair and instructions. Thanks.

## 2014-11-08 MED ORDER — FLUTICASONE-SALMETEROL 250-50 MCG/DOSE IN AEPB: 1.0000 | INHALATION_SPRAY | Freq: Two times a day (BID) | RESPIRATORY_TRACT | Status: DC

## 2014-11-08 NOTE — Telephone Encounter (Signed)
Pt returning call.Carl Lee ° °

## 2014-11-08 NOTE — Telephone Encounter (Signed)
Patient notified that Ruthe Mannan has been denied and that we are switching his medication to Advair 250/50 BID.  Rx for Advair sent to pharmacy. Nothing further needed.

## 2014-11-15 ENCOUNTER — Encounter: Payer: Self-pay | Admitting: Family

## 2014-11-18 ENCOUNTER — Ambulatory Visit (INDEPENDENT_AMBULATORY_CARE_PROVIDER_SITE_OTHER): Payer: BLUE CROSS/BLUE SHIELD | Admitting: Family

## 2014-11-18 ENCOUNTER — Ambulatory Visit (HOSPITAL_COMMUNITY)
Admission: RE | Admit: 2014-11-18 | Discharge: 2014-11-18 | Disposition: A | Discharge status: Home / Self Care | Payer: BLUE CROSS/BLUE SHIELD | Source: Ambulatory Visit | Attending: Family | Admitting: Family

## 2014-11-18 ENCOUNTER — Encounter: Payer: Self-pay | Admitting: Family

## 2014-11-18 VITALS — BP 100/74 | HR 48 | Resp 14 | Ht 71.0 in | Wt 208.0 lb

## 2014-11-18 DIAGNOSIS — R079 Chest pain, unspecified: Secondary | ICD-10-CM | POA: Diagnosis not present

## 2014-11-18 DIAGNOSIS — Z87891 Personal history of nicotine dependence: Secondary | ICD-10-CM

## 2014-11-18 DIAGNOSIS — Z48812 Encounter for surgical aftercare following surgery on the circulatory system: Secondary | ICD-10-CM | POA: Insufficient documentation

## 2014-11-18 DIAGNOSIS — I714 Abdominal aortic aneurysm, without rupture, unspecified: Secondary | ICD-10-CM

## 2014-11-18 DIAGNOSIS — Z95828 Presence of other vascular implants and grafts: Secondary | ICD-10-CM | POA: Diagnosis not present

## 2014-11-18 DIAGNOSIS — R1030 Lower abdominal pain, unspecified: Secondary | ICD-10-CM | POA: Insufficient documentation

## 2014-11-18 LAB — VAS US AAA DUPLEX
ABAODAP: 4.15 cm
Abdominal Aorta aortic graft body PSV: 37 cm/s
Abdominal dist aorta trans: 4.47 cm
Abdominal rt com iliac AP: 2.23 cm
Abdominal rt com iliac trans: 2.23 cm

## 2014-11-18 NOTE — Progress Notes (Signed)
[facility] [place]  Established EVAR  History of Present Illness  Carl Lee is a 59 y.o. (Dec 03, 1955) male patient of Dr. [name] who is back today for followup. He is status post endovascular repair of a symptomatic abdominal aortic aneurysm. This was done using a [employer] device. Date of surgery was 03/25/2012. The patient initially presented with abdominal pain to his GI doctor. A CT scan showed a large abdominal aortic aneurysm. On examination he had a very tender aorta and therefore Dr. [name] decided to take him to the operating room for repair. This repair was uncomplicated. He came back to the office several days later with complaints of bilateral flank pain and hypersensitivity in both upper thighs as well as numbness in his thighs. He continues to have back pain. He has had injections which have helped the pain in his left hip however he still has numbness. He has been very fatigued lately.  Pt states when he eats his stomach hurts immediately for the last 6 months, denies food fear; states he has not lost weight in the last 6 months.  He saw Dr. Ardis Hughs, GI, for a colonoscopy since the EVAR.  He has spinal stenosis in his lumbar spine, states he gets temporary relief from pain with back injections. He also sees a pulmonologist, Dr. Waunita Schooner, pt states no reason was found for his degree of dyspnea with walking about 50-60 feet and legs feel tired. Pt states he thinks that his dyspnea is from pain in is chest wall after his CABG X3 vessels in July 2015; he denies any hx of MI. He denies non healing wounds in in his feet or lower legs.  Dr. [name] is his cardiologist.  Pt denies any hx of stroke or TIA  He has DM: (Review of records): His AIC in March 2016 was 6.2, in control He is a former smoker having quit in July 2015. He takes a daily 325 mg ASA, and no other antiplatelets nor anticoagulants.  He takes a daily statin.    Past Medical History   Diagnosis Date  . Bladder cancer 08/16/2008    [facility] then [place] now [place])  . Hypertension   . AAA (abdominal aortic aneurysm) 03/25/12    s/p endovascular repair  . Hx of migraines   . Contact dermatitis     atypical [name])  . Childhood asthma   . Environmental allergies     improved as ages  . Diverticulosis 2013    severe by CT and colonoscopy  . Adenomatous polyp 06/2009  . Lumbosacral radiculopathy at S1 03/2012    left, with spinal stenosis (MRI 04/2013) improved with TF ESI L5/S1 and S1/2 ([facility] [place])  . Spinal stenosis     LS1 nerve root impingement from bulging disc  . Coronary artery disease   . Zenker diverticulum   . Resistance to clopidogrel 2015    drug metabolism panel run - asked to scan  . OSA (obstructive sleep apnea) 09/04/2014    Severe with AHI 35/hr  . Vitamin B12 deficiency   . Diabetes mellitus type 2, controlled, with complications 92/42/6834    CAD   . COPD (chronic obstructive pulmonary disease)   . Diabetes mellitus without complication     Type II   Past Surgical History  Procedure Laterality Date  . Tonsillectomy and adenoidectomy  1973  . Vasectomy    . Bladder cancer  March 2010 and Oct. 2015    [facility]) x 2  .  Abdominal aortic aneurysm repair  03/25/12  . Colonoscopy  06/2012    hyperplastic polyp, diverticulosis (jacobs) rec rpt 5 yrs  . Cystoscopy  08/16/08    Bladder Cancer  . Esi  04/2013, 06/2013    L L5S1, S12 transforaminal ESI (Dr.  [name])  . Abi  05/2013    WNL, L TBI low at 0.66  . Coronary artery bypass graft N/A 02/07/2014    Procedure: CORONARY ARTERY BYPASS GRAFTING (CABG);  Surgeon: [name], MD;  Location: [facility];  Service: Open Heart Surgery;  Laterality: N/A;  CABG X 3, BILATERAL LIMA, EVH  . Intraoperative transesophageal echocardiogram N/A 02/07/2014    Procedure: INTRAOPERATIVE TRANSESOPHAGEAL ECHOCARDIOGRAM;  Surgeon: [name], MD;  Location: [facility];  Service: Open Heart  Surgery;  Laterality: N/A;  . Esi Left 04/2014, 05/2014, 06/2014    L5/S1, S1/2; rpt; L4/5  . Left heart catheterization with coronary angiogram N/A 02/01/2014    Procedure: LEFT HEART CATHETERIZATION WITH CORONARY ANGIOGRAM;  Surgeon: [name], MD;  Location: [facility] CATH LAB;  Service: Cardiovascular;  Laterality: N/A;  . Epidural block injection Left 09/2014    medial L2,3,4, dorsal L5 ramus blocks   Social History History  Substance Use Topics  . Smoking status: Former Smoker -- 1.00 packs/day for 40 years    Types: Cigarettes    Quit date: 02/05/2014  . Smokeless tobacco: Never Used  . Alcohol Use: 0.0 oz/week    0 Standard drinks or equivalent per week     Comment: Rare- states 1 oz. per week   Family History Family History  Problem Relation Age of Onset  . Diabetes Mother   . Arrhythmia Mother     pacemaker  . Cancer Mother     Bladder  . COPD Mother   . Thyroid disease Mother   . Hyperlipidemia Mother   . Hypertension Mother   . Diabetes Father   . CAD Father 15    CHF, MI  . Dementia Father   . Heart attack Father   . Diabetes Sister   . Colon cancer Neg Hx   . Diabetes Brother   . Hypertension Brother   . Heart attack Maternal Grandmother    Current Outpatient Prescriptions on File Prior to Visit  Medication Sig Dispense Refill  . aspirin EC 325 MG EC tablet Take 1 tablet (325 mg total) by mouth daily.    [name] atenolol (TENORMIN) 25 MG tablet Take 1 tablet (25 mg total) by mouth daily. 90 tablet 3  . atorvastatin (LIPITOR) 20 MG tablet Take 1 tablet (20 mg total) by mouth daily at 6 PM. 90 tablet 3  . Blood Glucose Monitoring Suppl (ONE TOUCH ULTRA SYSTEM KIT) W/DEVICE KIT 1 kit by Does not apply route once. 1 each 0  . cyclobenzaprine (FLEXERIL) 5 MG tablet Take 1 tablet (5 mg total) by mouth 3 (three) times daily as needed (migraine). 30 tablet 1  . Diclofenac Sodium POWD   0  . famotidine (PEPCID) 20 MG tablet Take 1 tablet (20 mg total) by mouth 2  (two) times daily. 180 tablet 3  . Fluticasone-Salmeterol (ADVAIR DISKUS) 250-50 MCG/DOSE AEPB Inhale 1 puff into the lungs 2 (two) times daily. 60 each 5  . Glucosamine-Chondroit-Vit C-Mn (GLUCOSAMINE 1500 COMPLEX PO) Take 2 tablets by mouth daily.    [name] glucose blood test strip check once daily and as needed 100 each 3  . hydrochlorothiazide (MICROZIDE) 12.5 MG capsule TAKE 1 CAPSULE (12.5  MG TOTAL) BY MOUTH DAILY. 90 capsule 3  . HYDROcodone-acetaminophen (NORCO/VICODIN) 5-325 MG per tablet Take 1 tablet by mouth every 8 (eight) hours as needed for severe pain. 20 tablet 0  . levalbuterol (XOPENEX HFA) 45 MCG/ACT inhaler Inhale 1-2 puffs into the lungs every 6 (six) hours as needed for wheezing. 1 Inhaler 12  . lisinopril (PRINIVIL,ZESTRIL) 20 MG tablet TAKE 1 TABLET BY MOUTH DAILY 30 tablet 6  . ONETOUCH DELICA LANCETS 37T MISC Check blood sugar once a day and as directed. Dx. E11.8 100 each 3  . vitamin B-12 (CYANOCOBALAMIN) 1000 MCG tablet Take 1 tablet (1,000 mcg total) by mouth daily.    . mometasone-formoterol (DULERA) 100-5 MCG/ACT AERO Inhale 2 puffs into the lungs 2 (two) times daily. (Patient not taking: Reported on 11/18/2014) 13 g 5   No current facility-administered medications on file prior to visit.   Allergies  Allergen Reactions  . Codeine Other (See Comments)    Nightmares     ROS: See HPI for pertinent positives and negatives.  Physical Examination  Filed Vitals:   11/18/14 0928  BP: 100/74  Pulse: 48  Resp: 14  Height: $Remove'5\' 11"'WDJHoAy$  (1.803 m)  Weight: 208 lb (94.348 kg)  SpO2: 97%   Body mass index is 29.02 kg/(m^2).  General: A&O x 3, WD.  Pulmonary: Sym exp, good air movt, CTAB, faint rales in left base, no rhonchi, or no wheezing.  Cardiac: RRR, Nl S1, S2, no Murmurs appreciated  Vascular: Vessel Right Left  Radial 1+Palpable 1+Palpable  Carotid audible without bruit audible without bruit  Aorta Not palpable N/A  Femoral Not Palpable Not Palpable   Popliteal Not palpable Not palpable  PT notPalpable 1+Palpable  DP 2+Palpable Not Palpable   Gastrointestinal: soft, NTND, -G/R, - HSM, - palpable masses, - CVAT B.  Musculoskeletal: M/S 5/5 throughout except 2/5 in left UE due to pain with use, Extremities without ischemic changes.  Neurologic: Pain and light touch intact in extremities, Motor exam as listed above  Non-Invasive Vascular Imaging  EVAR Duplex (Date: 03/25/12)  AAA sac size: 4.15 cm x 4.47 cm  no endoleak detected  Limited visualization of iliac arteries.  Right CIA: 2.23 x 2.23 cm  Left CIA: not visualized    Medical Decision Making  MAALIK PINN is a 59 y.o. male who presents s/p EVAR (Date: 03/25/12).  Pt does not have symptoms referable to EVAR,   stable sac size. Dr. [name] viewed the abdominal CTA images from November 2015; mesenteric and celiac arteries have no appreciable stenoses; also discussed today's EVAR Duplex results.  I discussed with the patient the importance of surveillance of the endograft.  The next endograft duplex will be scheduled for 12 months.  The patient will follow up with [place] in 12 months with these studies.  I emphasized the importance of maximal medical management including strict control of blood pressure, blood glucose, and lipid levels, antiplatelet agents, obtaining regular exercise, and cessation of smoking.   The patient was given information about AAA including signs, symptoms, treatment, and how to minimize the risk of enlargement and rupture of aneurysms.    Thank you for allowing [place] to participate in this patient's care.  Clemon Chambers, RN, MSN, FNP-C [facility] [place] Office: [facility] Physician: [name]  11/18/2014, 9:46 AM

## 2014-11-20 ENCOUNTER — Encounter: Payer: Self-pay | Admitting: Cardiology

## 2014-11-21 ENCOUNTER — Ambulatory Visit (INDEPENDENT_AMBULATORY_CARE_PROVIDER_SITE_OTHER): Payer: BLUE CROSS/BLUE SHIELD | Admitting: Internal Medicine

## 2014-11-21 ENCOUNTER — Telehealth: Payer: Self-pay | Admitting: Family Medicine

## 2014-11-21 ENCOUNTER — Encounter: Payer: Self-pay | Admitting: Internal Medicine

## 2014-11-21 VITALS — BP 110/62 | HR 60 | Temp 98.1°F | Wt 208.0 lb

## 2014-11-21 DIAGNOSIS — R0789 Other chest pain: Secondary | ICD-10-CM

## 2014-11-21 DIAGNOSIS — R0609 Other forms of dyspnea: Secondary | ICD-10-CM

## 2014-11-21 DIAGNOSIS — I10 Essential (primary) hypertension: Secondary | ICD-10-CM | POA: Diagnosis not present

## 2014-11-21 NOTE — Telephone Encounter (Signed)
See note

## 2014-11-21 NOTE — Telephone Encounter (Signed)
Patient Name: Carl Lee DOB: 02-07-1956 Initial Comment Caller states he may have fluid in his lungs his BP has dropped. Current reading 85/58--he is really tired and sleepy. Nurse Assessment Nurse: Ronnald Ramp, RN, Miranda Date/Time (Eastern Time): 11/21/2014 2:05:12 PM Confirm and document reason for call. If symptomatic, describe symptoms. ---Caller states his BP has been running lower than normal the last week. Today BP is 85/58. He has been having SOB 1-2 weeks. He talked to pulmonologist and prescribed Advair. Has the patient traveled out of the country within the last 30 days? ---Not Applicable Does the patient require triage? ---Yes Related visit to physician within the last 2 weeks? ---No Does the PT have any chronic conditions? (i.e. diabetes, asthma, etc.) ---Yes List chronic conditions. ---Hx of aneurysm repair, High Cholesterol, Diabetes, GERD, COPD Guidelines Guideline Title Affirmed Question Affirmed Notes Low Blood Pressure [7] Fall in systolic BP > 20 mm Hg from normal AND [2] dizzy, lightheaded, or weak Final Disposition User Go to ED Now (or PCP triage) Ronnald Ramp, RN, Miranda

## 2014-11-21 NOTE — Assessment & Plan Note (Signed)
He does have direct pain with sternal palpation--this seems to be all of his chest pain

## 2014-11-21 NOTE — Assessment & Plan Note (Signed)
BP Readings from Last 3 Encounters:  11/21/14 110/62  11/18/14 100/74  10/30/14 138/68   Will stop the HCTZ Continue ACEI and beta blocker given CAD

## 2014-11-21 NOTE — Patient Instructions (Signed)
Please stay off the advair and stop the HCTZ.

## 2014-11-21 NOTE — Telephone Encounter (Signed)
Pt is scheduled for appt 11/21/14 at 4 pm to see Dr Silvio Pate.

## 2014-11-21 NOTE — Progress Notes (Signed)
Subjective:    Patient ID: Carl Lee, male    DOB: 1955/09/26, 59 y.o.   MRN: [id number]  HPI Here with wife--- concerned about his breathing  SOB for the past couple of weeks Lightheaded at the same time also BP has been low-- 85/54 is the lowest  Recent visit with Dr [name] Put on advair-- no clear help and actually felt breathing got worse Worried that it was bringing his BP down--- so he stopped  CABG in July Healed well from this--but still has pain from sternal wound Not sure if this could be related to the SOB---has to breathe shallow  SOB is mostly DOE--- variable activity, quickly better with rest Some pain in legs with dyspnea No chest pain with the activity  Current Outpatient Prescriptions on File Prior to Visit  Medication Sig Dispense Refill  . aspirin EC 325 MG EC tablet Take 1 tablet (325 mg total) by mouth daily.    [name] atenolol (TENORMIN) 25 MG tablet Take 1 tablet (25 mg total) by mouth daily. 90 tablet 3  . atorvastatin (LIPITOR) 20 MG tablet Take 1 tablet (20 mg total) by mouth daily at 6 PM. 90 tablet 3  . Diclofenac Sodium POWD   0  . famotidine (PEPCID) 20 MG tablet Take 1 tablet (20 mg total) by mouth 2 (two) times daily. 180 tablet 3  . Fluticasone-Salmeterol (ADVAIR DISKUS) 250-50 MCG/DOSE AEPB Inhale 1 puff into the lungs 2 (two) times daily. 60 each 5  . Glucosamine-Chondroit-Vit C-Mn (GLUCOSAMINE 1500 COMPLEX PO) Take 2 tablets by mouth daily.    [name] glucose blood test strip check once daily and as needed 100 each 3  . hydrochlorothiazide (MICROZIDE) 12.5 MG capsule TAKE 1 CAPSULE (12.5 MG TOTAL) BY MOUTH DAILY. 90 capsule 3  . HYDROcodone-acetaminophen (NORCO/VICODIN) 5-325 MG per tablet Take 1 tablet by mouth every 8 (eight) hours as needed for severe pain. 20 tablet 0  . levalbuterol (XOPENEX HFA) 45 MCG/ACT inhaler Inhale 1-2 puffs into the lungs every 6 (six) hours as needed for wheezing. 1 Inhaler 12  . lisinopril (PRINIVIL,ZESTRIL) 20 MG  tablet TAKE 1 TABLET BY MOUTH DAILY 30 tablet 6  . ONETOUCH DELICA LANCETS 35K MISC Check blood sugar once a day and as directed. Dx. E11.8 100 each 3  . vitamin B-12 (CYANOCOBALAMIN) 1000 MCG tablet Take 1 tablet (1,000 mcg total) by mouth daily.     No current facility-administered medications on file prior to visit.    Allergies  Allergen Reactions  . Codeine Other (See Comments)    Nightmares    Past Medical History  Diagnosis Date  . Bladder cancer 08/16/2008    [facility] then [place] now [place])  . Hypertension   . AAA (abdominal aortic aneurysm) 03/25/12    s/p endovascular repair  . Hx of migraines   . Contact dermatitis     atypical [name])  . Childhood asthma   . Environmental allergies     improved as ages  . Diverticulosis 2013    severe by CT and colonoscopy  . Adenomatous polyp 06/2009  . Lumbosacral radiculopathy at S1 03/2012    left, with spinal stenosis (MRI 04/2013) improved with TF ESI L5/S1 and S1/2 ([facility] [place])  . Spinal stenosis     LS1 nerve root impingement from bulging disc  . Coronary artery disease   . Zenker diverticulum   . Resistance to clopidogrel 2015    drug metabolism panel run - asked to scan  .  OSA (obstructive sleep apnea) 09/04/2014    Severe with AHI 35/hr  . Vitamin B12 deficiency   . Diabetes mellitus type 2, controlled, with complications 99/24/2683    CAD   . COPD (chronic obstructive pulmonary disease)   . Diabetes mellitus without complication     Type II    Past Surgical History  Procedure Laterality Date  . Tonsillectomy and adenoidectomy  1973  . Vasectomy    . Bladder cancer  March 2010 and Oct. 2015    [facility]) x 2  . Abdominal aortic aneurysm repair  03/25/12  . Colonoscopy  06/2012    hyperplastic polyp, diverticulosis ([facility]) rec rpt 5 yrs  . Cystoscopy  08/16/08    Bladder Cancer  . Esi  04/2013, 06/2013    L L5S1, S12 transforaminal ESI (Dr.  [name])  . Abi  05/2013    WNL, L TBI low at 0.66    . Coronary artery bypass graft N/A 02/07/2014    Procedure: CORONARY ARTERY BYPASS GRAFTING (CABG);  Surgeon: [name], MD;  Location: [facility];  Service: Open Heart Surgery;  Laterality: N/A;  CABG X 3, BILATERAL LIMA, EVH  . Intraoperative transesophageal echocardiogram N/A 02/07/2014    Procedure: INTRAOPERATIVE TRANSESOPHAGEAL ECHOCARDIOGRAM;  Surgeon: [name], MD;  Location: [facility];  Service: Open Heart Surgery;  Laterality: N/A;  . Esi Left 04/2014, 05/2014, 06/2014    L5/S1, S1/2; rpt; L4/5  . Left heart catheterization with coronary angiogram N/A 02/01/2014    Procedure: LEFT HEART CATHETERIZATION WITH CORONARY ANGIOGRAM;  Surgeon: [name], MD;  Location: [facility] CATH LAB;  Service: Cardiovascular;  Laterality: N/A;  . Epidural block injection Left 09/2014    medial L2,3,4, dorsal L5 ramus blocks    Family History  Problem Relation Age of Onset  . Diabetes Mother   . Arrhythmia Mother     pacemaker  . Cancer Mother     Bladder  . COPD Mother   . Thyroid disease Mother   . Hyperlipidemia Mother   . Hypertension Mother   . Diabetes Father   . CAD Father 11    CHF, MI  . Dementia Father   . Heart attack Father   . Diabetes Sister   . Colon cancer Neg Hx   . Diabetes Brother   . Hypertension Brother   . Heart attack Maternal Grandmother     History   Social History  . Marital Status: Married    Spouse Name: N/A  . Number of Children: 2  . Years of Education: N/A   Occupational History  . Self Employed    Social History Main Topics  . Smoking status: Former Smoker -- 1.00 packs/day for 40 years    Types: Cigarettes    Quit date: 02/05/2014  . Smokeless tobacco: Never Used  . Alcohol Use: 0.0 oz/week    0 Standard drinks or equivalent per week     Comment: Rare- states 1 oz. per week  . Drug Use: No  . Sexual Activity: Not on file   Other Topics Concern  . Not on file   Social History Narrative   Caffeine: 1 cup coffee/day    Lives with wife, 1 dog   grown children   Occupation: Therapist, sports - self employed.  Not working, applied for disability.    Edu: 2 yrs college   Activity: fishing, walking occasionally   Diet: good water, fruits/vegetables daily   Review of Systems Sleeping with CPAP--sleeping well  now.  Sleeps flat---no PND Appetite is fair Weight stable    Objective:   Physical Exam  Constitutional: He appears well-developed and well-nourished. No distress.  Neck: Neck supple. No thyromegaly present.  Cardiovascular: Normal rate, regular rhythm and normal heart sounds.  Exam reveals no gallop.   No murmur heard. Pulmonary/Chest: Effort normal and breath sounds normal. No respiratory distress. He has no wheezes. He has no rales.  Musculoskeletal: He exhibits no edema.  Lymphadenopathy:    He has no cervical adenopathy.  Psychiatric: He has a normal mood and affect. His behavior is normal.          Assessment & Plan:

## 2014-11-21 NOTE — Progress Notes (Signed)
Pre visit review using our clinic review tool, if applicable. No additional management support is needed unless otherwise documented below in the visit note. 

## 2014-11-21 NOTE — Assessment & Plan Note (Signed)
Has had some symptoms for a few weeks Seems to correspond to starting the advair---he just stopped it 2 days ago BP running low--has some dizziness and this may be coexisting with the DOE BP Readings from Last 3 Encounters:  11/21/14 110/62  11/18/14 100/74  10/30/14 138/68   Recheck 98/58 on right  Will have him stay off the advair Stop HCTZ Exam doesn't indicate CHF or pleural effusion and recent CXR okay--will hold off on testing May need further eval if this persists

## 2014-11-21 NOTE — Telephone Encounter (Signed)
Patient Name: Carl Lee DOB: [birth year] Initial Comment Caller states he wants to talk to the nurse again. Nurse Assessment Nurse: Ronnald Ramp, RN, Miranda Date/Time (Eastern Time): 11/21/2014 2:56:35 PM Confirm and document reason for call. If symptomatic, describe symptoms. ---Caller states his BP has gone up to 111/60 since he called. He is not having any other symptoms requesting an appt. Has the patient traveled out of the country within the last 30 days? ---Not Applicable Does the patient require triage? ---No Please document clinical information provided and list any resource used. ---Scheduled appt to be seen since < 2 hrs from now. Appt scheduled for 4pm with Dr. Silvio Pate.

## 2014-11-22 ENCOUNTER — Encounter: Payer: Self-pay | Admitting: Family Medicine

## 2014-11-27 ENCOUNTER — Telehealth: Payer: Self-pay | Admitting: Cardiology

## 2014-11-27 ENCOUNTER — Encounter: Payer: Self-pay | Admitting: Family Medicine

## 2014-11-27 ENCOUNTER — Ambulatory Visit (INDEPENDENT_AMBULATORY_CARE_PROVIDER_SITE_OTHER): Payer: BLUE CROSS/BLUE SHIELD | Admitting: Family Medicine

## 2014-11-27 ENCOUNTER — Telehealth: Payer: Self-pay | Admitting: Family Medicine

## 2014-11-27 VITALS — BP 112/62 | HR 58 | Temp 98.0°F | Wt 211.8 lb

## 2014-11-27 DIAGNOSIS — R519 Headache, unspecified: Secondary | ICD-10-CM | POA: Insufficient documentation

## 2014-11-27 DIAGNOSIS — R0609 Other forms of dyspnea: Secondary | ICD-10-CM | POA: Diagnosis not present

## 2014-11-27 DIAGNOSIS — R51 Headache: Secondary | ICD-10-CM

## 2014-11-27 DIAGNOSIS — M542 Cervicalgia: Secondary | ICD-10-CM | POA: Diagnosis not present

## 2014-11-27 DIAGNOSIS — G500 Trigeminal neuralgia: Secondary | ICD-10-CM | POA: Insufficient documentation

## 2014-11-27 LAB — BUN: BUN: 15 mg/dL (ref 6–23)

## 2014-11-27 LAB — CREATININE, SERUM: Creatinine, Ser: 0.89 mg/dL (ref 0.40–1.50)

## 2014-11-27 LAB — SEDIMENTATION RATE: Sed Rate: 4 mm/hr (ref 0–22)

## 2014-11-27 MED ORDER — CARBAMAZEPINE ER 100 MG PO TB12: 100.0000 mg | ORAL_TABLET | Freq: Two times a day (BID) | ORAL | Status: DC

## 2014-11-27 NOTE — Assessment & Plan Note (Signed)
New- ? Trigeminal neuralgia vs zoster prior to rash appearing. Most consistent with TGN. Treat with tegretol 100 mg twice daily. CT of neck to look at soft tissues in neck- ? Trigger to TGN. SED rate today. Follow up with PCP on Friday.  GO to ER if symptoms worsen. The patient indicates understanding of these issues and agrees with the plan.

## 2014-11-27 NOTE — Telephone Encounter (Signed)
[facility] Patient Name: Carl Lee DOB: 29-Dec-1955 Initial Comment Caller states he was taken off a bp medication due to low bp, but may be high now, has earache and soreness on right side of face Nurse Assessment Nurse: Markus Daft, RN, Sherre Poot Date/Time (Eastern Time): 11/27/2014 9:47:46 AM Confirm and document reason for call. If symptomatic, describe symptoms. ---1) Caller states he is having pain on top of his right side of head and right sided jaw and ear pain. States that his "hair hurts" and tender to touch. Right side of throat/under the chin is also painful. Denies sore throat like generally speaking but it does hurt on the right side when he swallows. He can stills swallow. Started yesterday and severe this AM. Has always had some chest discomfort r/t CABG in 2015, and this doesn't feel differently now. Walking 40 feet approx, c/o sob. 2) He states also that he was taken off HCTZ 12.5 mg daily 2 days d/t low SBP 60-80's. 148/96 this AM. Should he resume it? Has the patient traveled out of the country within the last 30 days? ---No Does the patient require triage? ---Yes Related visit to physician within the last 2 weeks? ---Yes Does the PT have any chronic conditions? (i.e. diabetes, asthma, etc.) ---Yes List chronic conditions. ---CABG x 3 vessels 02/07/2014; CAD; NIDDM; HTN; Aortic graft - secondary to aortic aneurysm 03/24/2012, bladder CA, Seasonal allergies, OSA - using CPAP, Mild COPD Guidelines Guideline Title Affirmed Question Affirmed Notes Face Pain [1] Swollen area of face AND [2] is painful to touch 5/10 now, with random pains coming on suddenly especially ear causing pain at 10/10 Final Disposition User See Physician within 4 Hours (or PCP triage) Markus Daft, RN, Sherre Poot Comments Appt made with Dr. Deborra Medina today at 11:45 am as his PCP has no available appts. (note: pt seeing  his eye doctor now).

## 2014-11-27 NOTE — Progress Notes (Signed)
Subjective:   Patient ID: Carl Lee, male    DOB: [birth year], 59 y.o.   MRN: [id number]  Carl Lee is a pleasant 59 y.o. year old male pt of Dr. Darnell Level, new to me, who presents to clinic today with Hypertension; Jaw Pain; and Headache  on 11/27/2014  HPI:  Saw Dr. Silvio Pate 6 days ago for DOE and CP.  Note reviewed. At that time was complaining of hypotension and DOE.   S/p CABG in July.  Dr. Silvio Pate felt it may be correlated to his advair and low blood pressure.  He had already stopped taking advair and Dr. Silvio Pate advised him to d/c HCTZ.  BPs have been fluctuating since.    Since he was tender with direct sternal palpation, he felt chest pain was non cardiac.  SOB is a little better.  Since yesterday, right sided facial, jaw and ear pain.  Even "hair hurts" to touch.   Very severe, sharp "spurts of pain" that "drop him to his knees."    Also feels like the glands on the right side of his neck are swollen and tender to touch.    Neg MRI of brain in 08/2014.  Just had an eye exam yesterday.  Current Outpatient Prescriptions on File Prior to Visit  Medication Sig Dispense Refill  . aspirin EC 325 MG EC tablet Take 1 tablet (325 mg total) by mouth daily.    [name] atenolol (TENORMIN) 25 MG tablet Take 1 tablet (25 mg total) by mouth daily. 90 tablet 3  . atorvastatin (LIPITOR) 20 MG tablet Take 1 tablet (20 mg total) by mouth daily at 6 PM. 90 tablet 3  . Diclofenac Sodium POWD   0  . famotidine (PEPCID) 20 MG tablet Take 1 tablet (20 mg total) by mouth 2 (two) times daily. 180 tablet 3  . Glucosamine-Chondroit-Vit C-Mn (GLUCOSAMINE 1500 COMPLEX PO) Take 2 tablets by mouth daily.    [name] glucose blood test strip check once daily and as needed 100 each 3  . HYDROcodone-acetaminophen (NORCO/VICODIN) 5-325 MG per tablet Take 1 tablet by mouth every 8 (eight) hours as needed for severe pain. 20 tablet 0  . levalbuterol (XOPENEX HFA) 45 MCG/ACT inhaler Inhale 1-2 puffs into the lungs every 6 (six)  hours as needed for wheezing. 1 Inhaler 12  . lisinopril (PRINIVIL,ZESTRIL) 20 MG tablet TAKE 1 TABLET BY MOUTH DAILY 30 tablet 6  . ONETOUCH DELICA LANCETS 33A MISC Check blood sugar once a day and as directed. Dx. E11.8 100 each 3  . vitamin B-12 (CYANOCOBALAMIN) 1000 MCG tablet Take 1 tablet (1,000 mcg total) by mouth daily.     No current facility-administered medications on file prior to visit.    Allergies  Allergen Reactions  . Codeine Other (See Comments)    Nightmares    Past Medical History  Diagnosis Date  . Bladder cancer 08/16/2008    [facility] [place] [facility] [place])  . Hypertension   . AAA (abdominal aortic aneurysm) 03/25/12    s/p endovascular repair  . Hx of migraines   . Contact dermatitis     atypical [name])  . Childhood asthma   . Environmental allergies     improved as ages  . Diverticulosis 2013    severe by CT and colonoscopy  . Adenomatous polyp 06/2009  . Lumbosacral radiculopathy at S1 03/2012    left, with spinal stenosis (MRI 04/2013) improved with TF ESI L5/S1 and S1/2 ([facility])  . Spinal stenosis  LS1 nerve root impingement from bulging disc  . Coronary artery disease   . Zenker diverticulum   . Resistance to clopidogrel 2015    drug metabolism panel run - asked to scan  . OSA (obstructive sleep apnea) 09/04/2014    Severe with AHI 35/hr  . Vitamin B12 deficiency   . Diabetes mellitus type 2, controlled, with complications 50/03/3817    CAD   . COPD (chronic obstructive pulmonary disease)   . Diabetes mellitus without complication     Type II    Past Surgical History  Procedure Laterality Date  . Tonsillectomy and adenoidectomy  1973  . Vasectomy    . Bladder cancer  March 2010 and Oct. 2015    [facility]) x 2  . Abdominal aortic aneurysm repair  03/25/12  . Colonoscopy  06/2012    hyperplastic polyp, diverticulosis ([facility]) rec rpt 5 yrs  . Cystoscopy  08/16/08    Bladder Cancer  . Esi  04/2013, 06/2013    L L5S1, S12  transforaminal ESI (Dr.  [name])  . Abi  05/2013    WNL, L TBI low at 0.66  . Coronary artery bypass graft N/A 02/07/2014    Procedure: CORONARY ARTERY BYPASS GRAFTING (CABG);  Surgeon: [name], MD;  Location: [facility];  Service: Open Heart Surgery;  Laterality: N/A;  CABG X 3, BILATERAL LIMA, EVH  . Intraoperative transesophageal echocardiogram N/A 02/07/2014    Procedure: INTRAOPERATIVE TRANSESOPHAGEAL ECHOCARDIOGRAM;  Surgeon: [name], MD;  Location: [facility];  Service: Open Heart Surgery;  Laterality: N/A;  . Esi Left 04/2014, 05/2014, 06/2014    L5/S1, S1/2; rpt; L4/5  . Left heart catheterization with coronary angiogram N/A 02/01/2014    Procedure: LEFT HEART CATHETERIZATION WITH CORONARY ANGIOGRAM;  Surgeon: [name], MD;  Location: [facility] CATH LAB;  Service: Cardiovascular;  Laterality: N/A;  . Epidural block injection Left 09/2014, 10/2014    medial L2,3,4, dorsal L5 ramus blocks x2 [name])    Family History  Problem Relation Age of Onset  . Diabetes Mother   . Arrhythmia Mother     pacemaker  . Cancer Mother     Bladder  . COPD Mother   . Thyroid disease Mother   . Hyperlipidemia Mother   . Hypertension Mother   . Diabetes Father   . CAD Father 61    CHF, MI  . Dementia Father   . Heart attack Father   . Diabetes Sister   . Colon cancer Neg Hx   . Diabetes Brother   . Hypertension Brother   . Heart attack Maternal Grandmother     History   Social History  . Marital Status: Married    Spouse Name: N/A  . Number of Children: 2  . Years of Education: N/A   Occupational History  . Self Employed    Social History Main Topics  . Smoking status: Former Smoker -- 1.00 packs/day for 40 years    Types: Cigarettes    Quit date: 02/05/2014  . Smokeless tobacco: Never Used  . Alcohol Use: 0.0 oz/week    0 Standard drinks or equivalent per week     Comment: Rare- states 1 oz. per week  . Drug Use: No  . Sexual Activity: Not  on file   Other Topics Concern  . 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    Edu: 2 yrs college   Activity: fishing, walking occasionally   Diet: good water, fruits/vegetables daily   The PMH, PSH, Social History, Family History, Medications, and allergies have been reviewed in [facility], and have been updated if relevant.   Review of Systems  Constitutional: Negative.   Eyes: Negative.   Respiratory: Positive for shortness of breath.   Cardiovascular: Negative.   Gastrointestinal: Negative.   Skin: Negative.   Neurological: Positive for headaches. Negative for tremors, seizures, syncope, facial asymmetry, speech difficulty, weakness and numbness.  Hematological: Negative.   Psychiatric/Behavioral: Negative.   All other systems reviewed and are negative.      Objective:    BP 112/62 mmHg  Pulse 58  Temp(Src) 98 F (36.7 C) (Oral)  Wt 211 lb 12 oz (96.049 kg)  SpO2 97%   Physical Exam  Constitutional: He is oriented to person, place, and time. He appears well-developed and well-nourished. No distress.  HENT:  Head: Normocephalic.  Points to pain over facial nerve distribution on right side, right parotid gland a little tender to palpation as well.  Has a painful episode that lasts a few seconds in the exam room  Eyes: Conjunctivae are normal.  Cardiovascular: Normal rate.   Pulmonary/Chest: Effort normal.  Neurological: He is alert and oriented to person, place, and time. No cranial nerve deficit.  Skin: Skin is warm and dry.  Psychiatric: He has a normal mood and affect. His behavior is normal. Judgment and thought content normal.  Nursing note and vitals reviewed.         Assessment & Plan:   DOE (dyspnea on exertion)  Facial pain, acute  Right-sided headache No Follow-up on file.

## 2014-11-27 NOTE — Assessment & Plan Note (Signed)
?   Parotid gland. Recent dental exam- less likely dental abscess. CT of neck for further evaluation.

## 2014-11-27 NOTE — Patient Instructions (Signed)
Trigeminal Neuralgia  Trigeminal neuralgia is a nerve disorder that causes sudden attacks of severe facial pain. It is caused by damage to the trigeminal nerve, a major nerve in the face. It is more common in women and in the elderly, although it can also happen in younger patients. Attacks last from a few seconds to several minutes and can occur from a couple of times per year to several times per day. Trigeminal neuralgia can be a very distressing and disabling condition. Surgery may be needed in very severe cases if medical treatment does not give relief.  HOME CARE INSTRUCTIONS    If your caregiver prescribed medication to help prevent attacks, take as directed.   To help prevent attacks:   Chew on the unaffected side of the mouth.   Avoid touching your face.   Avoid blasts of hot or cold air.   Men may wish to grow a beard to avoid having to shave.  SEEK IMMEDIATE MEDICAL CARE IF:   Pain is unbearable and your medicine does not help.   You develop new, unexplained symptoms (problems).   You have problems that may be related to a medication you are taking.  Document Released: 06/25/2000 Document Revised: 09/20/2011 Document Reviewed: 04/25/2009  Exit[employer] Patient Information 2015 [employer]. This information is not intended to replace advice given to you by your health care provider. Make sure you discuss any questions you have with your health care provider.

## 2014-11-27 NOTE — Telephone Encounter (Signed)
Left message to call back  

## 2014-11-27 NOTE — Addendum Note (Signed)
Addended by: Daralene Milch C on: 11/27/2014 01:11 PM   Modules accepted: Orders, SmartSet

## 2014-11-27 NOTE — Progress Notes (Signed)
Pre visit review using our clinic review tool, if applicable. No additional management support is needed unless otherwise documented below in the visit note. 

## 2014-11-27 NOTE — Telephone Encounter (Signed)
New message       i called to resc pt 12-17-14 appt with Dr Radford Pax.  He is coming in for sleep follow up.  He is not sure he needs to come and why is he coming to see Dr Radford Pax when he has his equipment.  Please call to explain how the sleep stuff works

## 2014-11-27 NOTE — Telephone Encounter (Signed)
Pt has appt with Dr Deborra Medina 11/27/14 at 11:45 am.

## 2014-11-28 ENCOUNTER — Ambulatory Visit (INDEPENDENT_AMBULATORY_CARE_PROVIDER_SITE_OTHER)
Admission: RE | Admit: 2014-11-28 | Discharge: 2014-11-28 | Disposition: A | Discharge status: Home / Self Care | Payer: BLUE CROSS/BLUE SHIELD | Source: Ambulatory Visit | Attending: Family Medicine | Admitting: Family Medicine

## 2014-11-28 DIAGNOSIS — M542 Cervicalgia: Secondary | ICD-10-CM

## 2014-11-28 MED ORDER — IOHEXOL 300 MG/ML  SOLN
76.0000 mL | Freq: Once | INTRAMUSCULAR | Status: AC | PRN
  Administered 2014-11-28: 76 mL via INTRAVENOUS

## 2014-11-29 ENCOUNTER — Encounter: Payer: Self-pay | Admitting: Family Medicine

## 2014-11-29 ENCOUNTER — Ambulatory Visit (INDEPENDENT_AMBULATORY_CARE_PROVIDER_SITE_OTHER): Payer: BLUE CROSS/BLUE SHIELD | Admitting: Family Medicine

## 2014-11-29 ENCOUNTER — Telehealth: Payer: Self-pay | Admitting: Cardiology

## 2014-11-29 VITALS — BP 118/68 | HR 56 | Temp 97.9°F | Wt 212.8 lb

## 2014-11-29 DIAGNOSIS — I10 Essential (primary) hypertension: Secondary | ICD-10-CM

## 2014-11-29 DIAGNOSIS — Z5181 Encounter for therapeutic drug level monitoring: Secondary | ICD-10-CM

## 2014-11-29 DIAGNOSIS — G500 Trigeminal neuralgia: Secondary | ICD-10-CM

## 2014-11-29 LAB — CBC WITH DIFFERENTIAL/PLATELET
Basophils Absolute: 0.1 10*3/uL (ref 0.0–0.1)
Basophils Relative: 0.6 % (ref 0.0–3.0)
EOS ABS: 0.4 10*3/uL (ref 0.0–0.7)
Eosinophils Relative: 3.9 % (ref 0.0–5.0)
HCT: 44.4 % (ref 39.0–52.0)
HEMOGLOBIN: 15.3 g/dL (ref 13.0–17.0)
Lymphocytes Relative: 21.4 % (ref 12.0–46.0)
Lymphs Abs: 2 10*3/uL (ref 0.7–4.0)
MCHC: 34.4 g/dL (ref 30.0–36.0)
MCV: 92.3 fl (ref 78.0–100.0)
Monocytes Absolute: 0.8 10*3/uL (ref 0.1–1.0)
Monocytes Relative: 8.5 % (ref 3.0–12.0)
NEUTROS ABS: 6 10*3/uL (ref 1.4–7.7)
NEUTROS PCT: 65.6 % (ref 43.0–77.0)
PLATELETS: 280 10*3/uL (ref 150.0–400.0)
RBC: 4.81 Mil/uL (ref 4.22–5.81)
RDW: 13.2 % (ref 11.5–15.5)
WBC: 9.1 10*3/uL (ref 4.0–10.5)

## 2014-11-29 LAB — COMPREHENSIVE METABOLIC PANEL
ALT: 7 U/L (ref 0–53)
AST: 13 U/L (ref 0–37)
Albumin: 4 g/dL (ref 3.5–5.2)
Alkaline Phosphatase: 91 U/L (ref 39–117)
BUN: 21 mg/dL (ref 6–23)
CALCIUM: 9.1 mg/dL (ref 8.4–10.5)
CHLORIDE: 106 meq/L (ref 96–112)
CO2: 28 mEq/L (ref 19–32)
CREATININE: 0.77 mg/dL (ref 0.40–1.50)
GFR: 109.28 mL/min (ref >60.00)
Glucose, Bld: 89 mg/dL (ref 70–99)
POTASSIUM: 4.3 meq/L (ref 3.5–5.1)
Sodium: 140 mEq/L (ref 135–145)
Total Bilirubin: 0.6 mg/dL (ref 0.2–1.2)
Total Protein: 6.5 g/dL (ref 6.0–8.3)

## 2014-11-29 LAB — IBC PANEL
Iron: 93 ug/dL (ref 42–165)
Saturation Ratios: 21.9 % (ref 20.0–50.0)
TRANSFERRIN: 304 mg/dL (ref 212.0–360.0)

## 2014-11-29 LAB — TSH: TSH: 1.5 u[IU]/mL (ref 0.35–4.50)

## 2014-11-29 MED ORDER — HYDROCODONE-ACETAMINOPHEN 5-325 MG PO TABS: 1.0000 | ORAL_TABLET | Freq: Three times a day (TID) | ORAL | Status: DC | PRN

## 2014-11-29 NOTE — Progress Notes (Signed)
Pre visit review using our clinic review tool, if applicable. No additional management support is needed unless otherwise documented below in the visit note. 

## 2014-11-29 NOTE — Assessment & Plan Note (Signed)
bp stable today off hctz. Pt reports some fluctuating readings at home, doesn't regularly check.

## 2014-11-29 NOTE — Progress Notes (Signed)
BP 118/68 mmHg  Pulse 56  Temp(Src) 97.9 F (36.6 C) (Oral)  Wt 212 lb 12 oz (96.503 kg)  SpO2 96%   CC: f/u head pain  Subjective:    Patient ID: Carl Lee, male    DOB: 07-27-1955, 59 y.o.   MRN: [id number]  HPI: Carl Lee is a 59 y.o. male presenting on 11/29/2014 for Follow-up   Seen 11/27/2014 by Dr Deborra Medina with acute onset of right sided facial pain, jaw and ear pain. Severe sharp pains. Thought related to trigeminal neuralgia vs early shingles. Tegretol 154m bid started. CT neck negative, ESR 4.  Since seen here, 2d ago, no new rashes, pain improved with starting tegretol yesterday but again today worsening. Endorses painful swallowing, touching hair hurts. .   Previously seen by Dr LSilvio Patewith hypotension and DOE thought related to advair, rec stop HCTZ. Overall bp controlled but still notices some highs and lows.   Did have tick bite, attached about 1 day, 1 week ago. No fevers, new rashes.   Recent eye exam. Upcoming dental exam.  CPAP started 1 month ago  Relevant past medical, surgical, family and social history reviewed and updated as indicated. Interim medical history since our last visit reviewed. Allergies and medications reviewed and updated. Current Outpatient Prescriptions on File Prior to Visit  Medication Sig  . aspirin EC 325 MG EC tablet Take 1 tablet (325 mg total) by mouth daily.  .Marland Kitchenatenolol (TENORMIN) 25 MG tablet Take 1 tablet (25 mg total) by mouth daily.  .Marland Kitchenatorvastatin (LIPITOR) 20 MG tablet Take 1 tablet (20 mg total) by mouth daily at 6 PM.  . carbamazepine (TEGRETOL XR) 100 MG 12 hr tablet Take 1 tablet (100 mg total) by mouth 2 (two) times daily.  . Diclofenac Sodium POWD   . famotidine (PEPCID) 20 MG tablet Take 1 tablet (20 mg total) by mouth 2 (two) times daily.  . Glucosamine-Chondroit-Vit C-Mn (GLUCOSAMINE 1500 COMPLEX PO) Take 2 tablets by mouth daily.  .Marland Kitchenglucose blood test strip check once daily and as needed  . levalbuterol (XOPENEX  HFA) 45 MCG/ACT inhaler Inhale 1-2 puffs into the lungs every 6 (six) hours as needed for wheezing.  .Marland Kitchenlisinopril (PRINIVIL,ZESTRIL) 20 MG tablet TAKE 1 TABLET BY MOUTH DAILY  . ONETOUCH DELICA LANCETS 302HMISC Check blood sugar once a day and as directed. Dx. E11.8  . vitamin B-12 (CYANOCOBALAMIN) 1000 MCG tablet Take 1 tablet (1,000 mcg total) by mouth daily.   No current facility-administered medications on file prior to visit.    Review of Systems Per HPI unless specifically indicated above     Objective:    BP 118/68 mmHg  Pulse 56  Temp(Src) 97.9 F (36.6 C) (Oral)  Wt 212 lb 12 oz (96.503 kg)  SpO2 96%  Wt Readings from Last 3 Encounters:  11/29/14 212 lb 12 oz (96.503 kg)  11/27/14 211 lb 12 oz (96.049 kg)  11/21/14 208 lb (94.348 kg)    Physical Exam  Constitutional: He appears well-developed and well-nourished. No distress.  HENT:  Mouth/Throat: Oropharynx is clear and moist. No oropharyngeal exudate.  R facial pain to touch from neck to top of head, main sensitivity of skin at apex of skull on right Has several fleeting episodes of sharp electrical R facial pain in office  Eyes: Conjunctivae and EOM are normal. Pupils are equal, round, and reactive to light.  Neck: Carotid bruit is not present. No thyromegaly present.  Cardiovascular: Normal rate,  regular rhythm, normal heart sounds and intact distal pulses.   No murmur heard. Pulmonary/Chest: Effort normal and breath sounds normal. No respiratory distress. He has no wheezes. He has no rales.  Musculoskeletal: He exhibits no edema.  Lymphadenopathy:    He has no cervical adenopathy.  Skin: Skin is warm and dry. No rash noted.  No vesicular rash present. Tick bite healed well R posterior shoulder without erythema or rash or foreign body  Psychiatric: He has a normal mood and affect.  Nursing note and vitals reviewed.  Results for orders placed or performed in visit on 11/27/14  Sedimentation Rate  Result Value  Ref Range   Sed Rate 4 0 - 22 mm/hr  BUN  Result Value Ref Range   BUN 15 6 - 23 mg/dL  Creatinine, serum  Result Value Ref Range   Creatinine, Ser 0.89 0.40 - 1.50 mg/dL      Assessment & Plan:   Problem List Items Addressed This Visit    Hypertension    bp stable today off hctz. Pt reports some fluctuating readings at home, doesn't regularly check.      Right trigeminal neuralgia - Primary    Anticipate R trigeminal neuralgia. Agree with tegretol 115m bid, pt to [place], if no better would increase tegretol if tolerated o/w try different med (gabapentin?). Check CBC, CMP, and iron levels as tegretol recently started Hydrocodone for breakthrough pain prn. Not consistent with tick borne illness (no fevers/rash), zostavax (no rash), doubt intracranial vascular process.       Other Visit Diagnoses    Therapeutic drug monitoring        Relevant Orders    Comprehensive metabolic panel    CBC with Differential/Platelet    TSH    IBC panel        Follow up plan: Return if symptoms worsen or fail to improve.

## 2014-11-29 NOTE — Telephone Encounter (Signed)
New problem ° ° °Pt returning call from nurse. °

## 2014-11-29 NOTE — Assessment & Plan Note (Addendum)
Anticipate R trigeminal neuralgia. Agree with tegretol 100mg  bid, pt to call us with update next week, if no better would increase tegretol if tolerated o/w try different med (gabapentin?). Check CBC, CMP, and iron levels as tegretol recently started Hydrocodone for breakthrough pain prn. Not consistent with tick borne illness (no fevers/rash), zostavax (no rash), doubt intracranial vascular process.

## 2014-11-29 NOTE — Patient Instructions (Addendum)
Continue tegretol 100mg  twice daily.  Try hydrocodone for breakthrough pain labwork today. Update [place] next week with effect of tegretol for possible increase in dose.

## 2014-11-29 NOTE — Telephone Encounter (Signed)
Tried to call again to explain why he needs the sleep follow-up appointments.   I left a detailed message on private VM as to why those appointments are made. I asked thathecall back to reschedule his sleep follow-up with Dr. Radford Pax

## 2014-12-02 NOTE — Telephone Encounter (Signed)
Explained in detail how the PAP procedure works. Rescheduled 10 week FU OV with Dr. Radford Pax. Patient grateful for callback.

## 2014-12-04 ENCOUNTER — Telehealth: Payer: Self-pay

## 2014-12-04 MED ORDER — CARBAMAZEPINE ER 200 MG PO TB12: 200.0000 mg | ORAL_TABLET | Freq: Two times a day (BID) | ORAL | Status: DC

## 2014-12-04 NOTE — Telephone Encounter (Signed)
Patient notified and verbalized understanding. 

## 2014-12-04 NOTE — Telephone Encounter (Signed)
Let's increase tegretol to 200mg  twice daily. Higher dose sent to pharmacy. Call us with update on Monday.

## 2014-12-04 NOTE — Telephone Encounter (Signed)
Pt was seen 11/29/14; pt does not see any improvement in how pt feeling since seen on 11/29/14. Pt has been taking tegretol 100 mg twice a day as instructed. Top of head is still sore but more concerning is pt having a lot of pain when swallow on rt side of throat; no S/T but hurts to swallow. [facility] request cb.

## 2014-12-07 ENCOUNTER — Other Ambulatory Visit: Payer: Self-pay | Admitting: Family Medicine

## 2014-12-16 ENCOUNTER — Telehealth: Payer: Self-pay | Admitting: Family Medicine

## 2014-12-16 NOTE — Telephone Encounter (Signed)
Pt called to let you know he is some better he is stilling have a lot of problems His left elbow and  Arm hurts  He wanted to know what he needed to do.  Offered appointment wanted medical assistant to call him [place]

## 2014-12-16 NOTE — Telephone Encounter (Signed)
Left elbow down arm hurting x 2 weeks. No repetitive motions or trauma recalled. Slightly swollen. Advised he needed to have it evaluated. Appt scheduled for tomorrow.

## 2014-12-16 NOTE — Telephone Encounter (Signed)
Will see tomorrow

## 2014-12-17 ENCOUNTER — Ambulatory Visit (INDEPENDENT_AMBULATORY_CARE_PROVIDER_SITE_OTHER): Payer: BLUE CROSS/BLUE SHIELD | Admitting: Family Medicine

## 2014-12-17 ENCOUNTER — Ambulatory Visit: Payer: BLUE CROSS/BLUE SHIELD | Admitting: Cardiology

## 2014-12-17 ENCOUNTER — Encounter: Payer: Self-pay | Admitting: Family Medicine

## 2014-12-17 VITALS — BP 130/80 | HR 64 | Temp 98.3°F | Wt 209.5 lb

## 2014-12-17 DIAGNOSIS — J441 Chronic obstructive pulmonary disease with (acute) exacerbation: Secondary | ICD-10-CM | POA: Diagnosis not present

## 2014-12-17 DIAGNOSIS — M25522 Pain in left elbow: Secondary | ICD-10-CM | POA: Diagnosis not present

## 2014-12-17 DIAGNOSIS — J449 Chronic obstructive pulmonary disease, unspecified: Secondary | ICD-10-CM

## 2014-12-17 DIAGNOSIS — G500 Trigeminal neuralgia: Secondary | ICD-10-CM

## 2014-12-17 MED ORDER — PREDNISONE 20 MG PO TABS: ORAL_TABLET | ORAL | Status: DC

## 2014-12-17 NOTE — Progress Notes (Signed)
Pre visit review using our clinic review tool, if applicable. No additional management support is needed unless otherwise documented below in the visit note. 

## 2014-12-17 NOTE — Assessment & Plan Note (Addendum)
Not consistent with olecranon bursitis or with lateral epicondylitis. Anticipate elbow strain vs recent arthritis flare given endorsed limited ROM from yesterday - and doubt septic arthritis 2/2 rapid improvement overnight off antibiotics. Will treat with short prednisone taper, update if recurrent or persistent sxs for further eval (likely labwork + xray) Pt agrees with plan. Doubt tick borne illness.

## 2014-12-17 NOTE — Assessment & Plan Note (Signed)
Considering retrial of LABA/ICS. Initially worsened dyspnea.

## 2014-12-17 NOTE — Progress Notes (Signed)
BP 130/80 mmHg  Pulse 64  Temp(Src) 98.3 F (36.8 C) (Oral)  Wt 209 lb 8 oz (95.029 kg)   CC: L arm pain  Subjective:    Patient ID: Carl Lee, male    DOB: 10-27-1955, 59 y.o.   MRN: [id number]  HPI: KUPONO MARLING is a 59 y.o. male presenting on 12/17/2014 for Arm Pain   R trigeminal neuralgia - throat stays sore, mild R parietal and periorbital headache. Severe earache and electrical shooting pains have improved.   Lab Results  Component Value Date   ESRSEDRATE 4 11/27/2014    R handed male.   5d h/o L elbow pain/swelling with radiation down to fingers. Denies inciting trauma/injury or falls. Present for last few weeks but acutely worse for 5 days. No warmth. No fevers/chills, no repetitive L hand motions recently. Tried ibuprofen for this. Yesterday much worse than today. Slowly improving without treatment. Denies weakness of L hand, endorses mild finger numbness.   Recent L thigh tick bite 1 week ago. Attached <2 hours. No fevers, no new rashes.   Mild dyspnea and cough productive of mucous in the morning. H/o COPD, ex smoker quit 01/2014.   Relevant past medical, surgical, family and social history reviewed and updated as indicated. Interim medical history since our last visit reviewed. Allergies and medications reviewed and updated. Current Outpatient Prescriptions on File Prior to Visit  Medication Sig  . aspirin EC 325 MG EC tablet Take 1 tablet (325 mg total) by mouth daily.  [name] atenolol (TENORMIN) 25 MG tablet Take 1 tablet (25 mg total) by mouth daily.  [name] atorvastatin (LIPITOR) 20 MG tablet Take 1 tablet (20 mg total) by mouth daily at 6 PM.  . carbamazepine (TEGRETOL XR) 200 MG 12 hr tablet Take 1 tablet (200 mg total) by mouth 2 (two) times daily.  . Diclofenac Sodium POWD   . famotidine (PEPCID) 20 MG tablet Take 1 tablet (20 mg total) by mouth 2 (two) times daily.  . Glucosamine-Chondroit-Vit C-Mn (GLUCOSAMINE 1500 COMPLEX PO) Take 2 tablets by mouth daily.  [name]  glucose blood test strip check once daily and as needed  . HYDROcodone-acetaminophen (NORCO/VICODIN) 5-325 MG per tablet Take 1 tablet by mouth every 8 (eight) hours as needed for severe pain.  [name] levalbuterol (XOPENEX HFA) 45 MCG/ACT inhaler Inhale 1-2 puffs into the lungs every 6 (six) hours as needed for wheezing.  [name] lisinopril (PRINIVIL,ZESTRIL) 20 MG tablet TAKE 1 TABLET BY MOUTH DAILY  . ONETOUCH DELICA LANCETS 98P MISC Check blood sugar once a day and as directed. Dx. E11.8  . vitamin B-12 (CYANOCOBALAMIN) 1000 MCG tablet Take 1 tablet (1,000 mcg total) by mouth daily.   No current facility-administered medications on file prior to visit.    Review of Systems Per HPI unless specifically indicated above     Objective:    BP 130/80 mmHg  Pulse 64  Temp(Src) 98.3 F (36.8 C) (Oral)  Wt 209 lb 8 oz (95.029 kg)  Wt Readings from Last 3 Encounters:  12/17/14 209 lb 8 oz (95.029 kg)  11/29/14 212 lb 12 oz (96.503 kg)  11/27/14 211 lb 12 oz (96.049 kg)    Physical Exam  Constitutional: He appears well-developed and well-nourished. No distress.  HENT:  Mouth/Throat: Oropharynx is clear and moist. No oropharyngeal exudate.  Neck: Normal range of motion. Neck supple.  Pulmonary/Chest: Effort normal. No respiratory distress. He has wheezes (left sided). He has no rales.  Musculoskeletal: He exhibits no  edema.  FROM in flexion/extension at elbow but some pain at end points. No pain at lateral epicondyle. Nonspecific pain with palpation of lateral elbow. No significant swelling appreciated today. No pain at olecranon bursa  Lymphadenopathy:    He has no cervical adenopathy.  Nursing note and vitals reviewed.  Results for orders placed or performed in visit on 11/29/14  Comprehensive metabolic panel  Result Value Ref Range   Sodium 140 135 - 145 mEq/L   Potassium 4.3 3.5 - 5.1 mEq/L   Chloride 106 96 - 112 mEq/L   CO2 28 19 - 32 mEq/L   Glucose, Bld 89 70 - 99 mg/dL   BUN 21  6 - 23 mg/dL   Creatinine, Ser 0.77 0.40 - 1.50 mg/dL   Total Bilirubin 0.6 0.2 - 1.2 mg/dL   Alkaline Phosphatase 91 39 - 117 U/L   AST 13 0 - 37 U/L   ALT 7 0 - 53 U/L   Total Protein 6.5 6.0 - 8.3 g/dL   Albumin 4.0 3.5 - 5.2 g/dL   Calcium 9.1 8.4 - 10.5 mg/dL   GFR 109.28 >60.00 mL/min  CBC with Differential/Platelet  Result Value Ref Range   WBC 9.1 4.0 - 10.5 K/uL   RBC 4.81 4.22 - 5.81 Mil/uL   Hemoglobin 15.3 13.0 - 17.0 g/dL   HCT 44.4 39.0 - 52.0 %   MCV 92.3 78.0 - 100.0 fl   MCHC 34.4 30.0 - 36.0 g/dL   RDW 13.2 11.5 - 15.5 %   Platelets 280.0 150.0 - 400.0 K/uL   Neutrophils Relative % 65.6 43.0 - 77.0 %   Lymphocytes Relative 21.4 12.0 - 46.0 %   Monocytes Relative 8.5 3.0 - 12.0 %   Eosinophils Relative 3.9 0.0 - 5.0 %   Basophils Relative 0.6 0.0 - 3.0 %   Neutro Abs 6.0 1.4 - 7.7 K/uL   Lymphs Abs 2.0 0.7 - 4.0 K/uL   Monocytes Absolute 0.8 0.1 - 1.0 K/uL   Eosinophils Absolute 0.4 0.0 - 0.7 K/uL   Basophils Absolute 0.1 0.0 - 0.1 K/uL  TSH  Result Value Ref Range   TSH 1.50 0.35 - 4.50 uIU/mL  IBC panel  Result Value Ref Range   Iron 93 42 - 165 ug/dL   Transferrin 304.0 212.0 - 360.0 mg/dL   Saturation Ratios 21.9 20.0 - 50.0 %      Assessment & Plan:   Problem List Items Addressed This Visit    COPD exacerbation    Moderate - treat with short prednisone course. No need for abx today. Update if worsening sxs. Discussed xopenex use.      Relevant Medications   predniSONE (DELTASONE) 20 MG tablet   COPD, moderate    Considering retrial of LABA/ICS. Initially worsened dyspnea.      Relevant Medications   predniSONE (DELTASONE) 20 MG tablet   Left elbow pain - Primary    Not consistent with olecranon bursitis or with lateral epicondylitis. Anticipate elbow strain vs recent arthritis flare given endorsed limited ROM from yesterday - and doubt septic arthritis 2/2 rapid improvement overnight off antibiotics. Will treat with short prednisone  taper, update if recurrent or persistent sxs for further eval (likely labwork + xray) Pt agrees with plan. Doubt tick borne illness.      Right trigeminal neuralgia    Overall improving - continue 200mg  XR tegretol BID. Titration limited by sedation. Discussed neurology referral, will monitor sxs for now. Pt agrees with plan.  Follow up plan: No Follow-up on file.

## 2014-12-17 NOTE — Patient Instructions (Signed)
Continue tegretol 200mg  twice daily. Prednisone course for elbow inflammation (possibly from arthritis) and for wheezing in chest.  Good to see you today, call us with questions.

## 2014-12-17 NOTE — Assessment & Plan Note (Signed)
Overall improving - continue 200mg  XR tegretol BID. Titration limited by sedation. Discussed neurology referral, will monitor sxs for now. Pt agrees with plan.

## 2014-12-17 NOTE — Assessment & Plan Note (Addendum)
Moderate - treat with short prednisone course. No need for abx today. Update if worsening sxs. Discussed xopenex use.

## 2014-12-20 ENCOUNTER — Encounter: Payer: Self-pay | Admitting: Cardiology

## 2014-12-20 ENCOUNTER — Ambulatory Visit (INDEPENDENT_AMBULATORY_CARE_PROVIDER_SITE_OTHER): Payer: BLUE CROSS/BLUE SHIELD | Admitting: Cardiology

## 2014-12-20 VITALS — BP 128/78 | HR 54 | Ht 71.0 in | Wt 209.0 lb

## 2014-12-20 DIAGNOSIS — G4733 Obstructive sleep apnea (adult) (pediatric): Secondary | ICD-10-CM

## 2014-12-20 DIAGNOSIS — I10 Essential (primary) hypertension: Secondary | ICD-10-CM

## 2014-12-20 NOTE — Progress Notes (Signed)
Cardiology Office Note   Date:  12/20/2014   ID:  Alondra, Sahni [birth year], MRN [id number]  PCP:  [name], MD    No chief complaint on file.     History of Present Illness: This is a 59yo WM with a history of HTN, CAD followed by Dr. [name] who underwent PSG due to snoring and excessive daytime sleepiness.  His wife had noticed for years that he would stop breathing in his sleep. He would take an afternoon nap and then go to bed at 8pm.  He was found to have severe OSA with an AHI of 35 events per hour with oxygen desaturations as low ast 81% and underwent successful CPAP titration to 13cm H2O.  He is doing well with his CPAP device.  He tolerates his nasal pillow mask and feels the pressure is adequate.  His daytime sleepiness has improved since starting the CPAP and feels rested when he gets up in the am.  He says that his wife has still noticed him snoring on occasion and still have problems stopping breathing.  He has had some problems with dry mouth as well.    Past Medical History  Diagnosis Date  . Bladder cancer 08/16/2008    [facility] then [place] [facility] [place])  . Hypertension   . AAA (abdominal aortic aneurysm) 03/25/12    s/p endovascular repair  . Hx of migraines   . Contact dermatitis     atypical [name])  . Childhood asthma   . Environmental allergies     improved as ages  . Diverticulosis 2013    severe by CT and colonoscopy  . Adenomatous polyp 06/2009  . Lumbosacral radiculopathy at S1 03/2012    left, with spinal stenosis (MRI 04/2013) improved with TF ESI L5/S1 and S1/2 ([facility] [place])  . Spinal stenosis     LS1 nerve root impingement from bulging disc  . Coronary artery disease   . Zenker diverticulum   . Resistance to clopidogrel 2015    drug metabolism panel run - asked to scan  . OSA (obstructive sleep apnea) 09/04/2014    Severe with AHI 35/hr  . Vitamin B12 deficiency   . Diabetes mellitus type 2, controlled, with  complications 27/09/5007    CAD   . COPD (chronic obstructive pulmonary disease)   . Diabetes mellitus without complication     Type II    Past Surgical History  Procedure Laterality Date  . Tonsillectomy and adenoidectomy  1973  . Vasectomy    . Bladder cancer  March 2010 and Oct. 2015    [facility]) x 2  . Abdominal aortic aneurysm repair  03/25/12  . Colonoscopy  06/2012    hyperplastic polyp, diverticulosis ([facility]) rec rpt 5 yrs  . Cystoscopy  08/16/08    Bladder Cancer  . Esi  04/2013, 06/2013    L L5S1, S12 transforaminal ESI (Dr.  [name])  . Abi  05/2013    WNL, L TBI low at 0.66  . Coronary artery bypass graft N/A 02/07/2014    Procedure: CORONARY ARTERY BYPASS GRAFTING (CABG);  Surgeon: [name], MD;  Location: [facility];  Service: Open Heart Surgery;  Laterality: N/A;  CABG X 3, BILATERAL LIMA, EVH  . Intraoperative transesophageal echocardiogram N/A 02/07/2014    Procedure: INTRAOPERATIVE TRANSESOPHAGEAL ECHOCARDIOGRAM;  Surgeon: [name], MD;  Location: [facility];  Service: Open Heart  Surgery;  Laterality: N/A;  . Esi Left 04/2014, 05/2014, 06/2014    L5/S1, S1/2; rpt; L4/5  . Left heart catheterization with coronary angiogram N/A 02/01/2014    Procedure: LEFT HEART CATHETERIZATION WITH CORONARY ANGIOGRAM;  Surgeon: [name], MD;  Location: [facility] CATH LAB;  Service: Cardiovascular;  Laterality: N/A;  . Epidural block injection Left 09/2014, 10/2014    medial L2,3,4, dorsal L5 ramus blocks x2 [name])     Current Outpatient Prescriptions  Medication Sig Dispense Refill  . ADVAIR DISKUS 250-50 MCG/DOSE AEPB Inhale 1 puff into the lungs daily.  4  . aspirin EC 325 MG EC tablet Take 1 tablet (325 mg total) by mouth daily.    [name] atenolol (TENORMIN) 25 MG tablet Take 1 tablet (25 mg total) by mouth daily. 90 tablet 3  . atorvastatin (LIPITOR) 20 MG tablet Take 1 tablet (20 mg total) by mouth daily at 6 PM. 90 tablet 3  . carbamazepine  (TEGRETOL XR) 200 MG 12 hr tablet Take 1 tablet (200 mg total) by mouth 2 (two) times daily. 60 tablet 3  . diazepam (VALIUM) 5 MG tablet Take 5 mg by mouth as directed. Take 10mg  1 hour prior to procedure  0  . Diclofenac Sodium POWD   0  . famotidine (PEPCID) 20 MG tablet Take 1 tablet (20 mg total) by mouth 2 (two) times daily. 180 tablet 3  . Glucosamine-Chondroit-Vit C-Mn (GLUCOSAMINE 1500 COMPLEX PO) Take 2 tablets by mouth daily.    [name] glucose blood test strip check once daily and as needed 100 each 3  . HYDROcodone-acetaminophen (NORCO/VICODIN) 5-325 MG per tablet Take 1 tablet by mouth every 8 (eight) hours as needed for severe pain. 30 tablet 0  . levalbuterol (XOPENEX HFA) 45 MCG/ACT inhaler Inhale 1-2 puffs into the lungs every 6 (six) hours as needed for wheezing. 1 Inhaler 12  . lisinopril (PRINIVIL,ZESTRIL) 20 MG tablet TAKE 1 TABLET BY MOUTH DAILY 30 tablet 6  . ONETOUCH DELICA LANCETS 03K MISC Check blood sugar once a day and as directed. Dx. E11.8 100 each 3  . predniSONE (DELTASONE) 20 MG tablet Take two tablets daily for 3 days followed by one tablet daily for 4 days 10 tablet 0  . vitamin B-12 (CYANOCOBALAMIN) 1000 MCG tablet Take 1 tablet (1,000 mcg total) by mouth daily.     No current facility-administered medications for this visit.    Allergies:   Codeine    Social History:  The patient  reports that he quit smoking about 10 months ago. His smoking use included Cigarettes. He has a 40 pack-year smoking history. He has never used smokeless tobacco. He reports that he drinks alcohol. He reports that he does not use illicit drugs.   Family History:  The patient's family history includes Arrhythmia in his mother; CAD (age of onset: 48) in his father; COPD in his mother; Cancer in his mother; Dementia in his father; Diabetes in his brother, father, mother, and sister; Heart attack in his father and maternal grandmother; Hyperlipidemia in his mother; Hypertension in his  brother and mother; Thyroid disease in his mother. There is no history of Colon cancer.    ROS:  Please see the history of present illness.   Otherwise, review of systems are positive for none.   All other systems are reviewed and negative.    PHYSICAL EXAM: VS:  BP 128/78 mmHg  Pulse 54  Ht 5\' 11"  (1.803 m)  Wt 209 lb (94.802 kg)  BMI 29.16 kg/m2 , BMI Body mass index is 29.16 kg/(m^2). GEN: Well nourished, well developed, in no acute distress HEENT: normal Neck: no JVD, carotid bruits, or masses Cardiac:  RRR; no murmurs, rubs, or gallops,no edema  Respiratory:  clear to auscultation bilaterally, normal work of breathing GI: soft, nontender, nondistended, + BS MS: no deformity or atrophy Skin: warm and dry, no rash Neuro:  Strength and sensation are intact Psych: euthymic mood, full affect   EKG:  EKG is not ordered today.    Recent Labs: 02/13/2014: Magnesium 2.1 11/29/2014: ALT 7; BUN 21; Creatinine, Ser 0.77; Hemoglobin 15.3; Platelets 280.0; Potassium 4.3; Sodium 140; TSH 1.50    Lipid Panel    Component Value Date/Time   CHOL 140 04/15/2014 0910   CHOL 170 01/05/2012   TRIG 168.0* 04/15/2014 0910   TRIG 141 01/05/2012   HDL 40.70 04/15/2014 0910   CHOLHDL 3 04/15/2014 0910   VLDL 33.6 04/15/2014 0910   LDLCALC 66 04/15/2014 0910   LDLDIRECT 105 01/05/2012      Wt Readings from Last 3 Encounters:  12/20/14 209 lb (94.802 kg)  12/17/14 209 lb 8 oz (95.029 kg)  11/29/14 212 lb 12 oz (96.503 kg)       ASSESSMENT AND PLAN:  1.  Severe OSA now on CPAP and doing well. I will get a download from his DME.  I am going to add a chin strap because I suspect that he is mouth breathing some resulting in dry mouth and continued snoring.  I have asked him to take his mask to the DME to make sure his mask is fitting adequately.   2.  HTN - controlled on Atenolol/ACE I    Current medicines are reviewed at length with the patient today.  The patient does not have  concerns regarding medicines.  The following changes have been made:  no change  Labs/ tests ordered today: See above Assessment and Plan No orders of the defined types were placed in this encounter.     Disposition:   FU with me in 6 months  Signed, Sueanne Margarita, MD  12/20/2014 9:36 AM    [facility] [address], [place], [place]  [address] Phone: [phone]; Fax: [phone]

## 2014-12-20 NOTE — Patient Instructions (Signed)
Medication Instructions:  Your physician recommends that you continue on your current medications as directed. Please refer to the Current Medication list given to you today.   Labwork: None  Testing/Procedures: None  Follow-Up: Your physician wants you to follow-up in: 6 months with Dr. Radford Pax. You will receive a reminder letter in the mail two months in advance. If you don't receive a letter, please call our office to schedule the follow-up appointment.   Any Other Special Instructions Will Be Listed Below (If Applicable). I will have Nora call you. Dr. Radford Pax would like Jonni Sanger to check your mask and enroll you so we can see your downloads online. We will also get you a chin strap.

## 2014-12-26 ENCOUNTER — Other Ambulatory Visit: Payer: Self-pay | Admitting: Family Medicine

## 2014-12-27 ENCOUNTER — Telehealth: Payer: Self-pay | Admitting: Cardiology

## 2014-12-27 NOTE — Telephone Encounter (Signed)
Patient wanted me to see if I could pull the most recent download to give to Dr. Radford Pax.  I have done so, and given to her for review.    I also sent [facility] a message to let them know that there is an order for Geryl Councilman Strap so that the patient can pick up.

## 2014-12-27 NOTE — Telephone Encounter (Signed)
New message      Talk to the sleep nurse.  He is having problems with his CPAP

## 2015-01-06 ENCOUNTER — Encounter: Payer: Self-pay | Admitting: Cardiology

## 2015-01-06 ENCOUNTER — Telehealth: Payer: Self-pay | Admitting: Cardiology

## 2015-01-06 NOTE — Telephone Encounter (Signed)
Patient was calling to see if Dr. Radford Pax had read his download yet.    He also stated that he had not heard from [facility] about his chin strap. I have spoken with Jonni Sanger and he stated the patient just needed to come in and get it. Jonni Sanger is calling him to let him know that it is ready.

## 2015-01-06 NOTE — Telephone Encounter (Signed)
Follow UP    Pt is calling following up on CPAP issue. States his mask is not working properly. Please call.

## 2015-01-09 ENCOUNTER — Encounter: Payer: Self-pay | Admitting: Family Medicine

## 2015-01-21 NOTE — Progress Notes (Signed)
Patient ID: Carl Lee, male   DOB: 1955/10/02, 59 y.o.   MRN: [id number] [name] returns today for a scheduled followup visit.  He is a 59 y.o.  gentleman who was noted to have an abnormal EKG on a preoperative evaluation prior to having bladder surgery. He had a stress test which was abnormal and that led to cardiac catheterization, which revealed severe three-vessel coronary disease.   He underwent coronary bypass grafting x3 with bilateral mammaries on February 07 2014 . He was extubated early postoperatively, but had severe wheezing and respiratory distress and had to be reintubated that evening. He was subsequently extubated the following day. He continued to have some issues with wheezing. He was discharged on dulera and Xopenex inhalers.   Also has history of AAA surgery in 2013 [place]  [place] 5/15 with residual 4.4 cm AAA sac  CABG:  [name] July 2015 PROCEDURE: Median sternotomy, extracorporeal circulation, coronary artery bypass grafting x3 (left internal mammary artery to LAD, right internal mammary artery to obtuse marginal via the transverse sinus, saphenous vein graft to posterior descending), endoscopic vein harvest, right thigh.  ROS: Denies fever, malais, weight loss, blurry vision, decreased visual acuity, cough, sputum, SOB, hemoptysis, pleuritic pain, palpitaitons, heartburn, abdominal pain, melena, lower extremity edema, claudication, or rash.  All other systems reviewed and negative  General: Affect appropriate Healthy:  appears stated age 59: normal Neck supple with no adenopathy JVP normal no bruits no thyromegaly Lungs clear with no wheezing and good diaphragmatic motion Heart:  S1/S2 no murmur, no rub, gallop or click PMI normal Abdomen: benighn, BS positve, no tenderness, no AAA no bruit.  No HSM or HJR Distal pulses intact with no bruits No edema Neuro non-focal Skin warm and dry No muscular weakness   Current Outpatient Prescriptions  Medication Sig  Dispense Refill  . ADVAIR DISKUS 250-50 MCG/DOSE AEPB Inhale 1 puff into the lungs daily.  4  . aspirin EC 325 MG EC tablet Take 1 tablet (325 mg total) by mouth daily.    [name] atenolol (TENORMIN) 25 MG tablet Take 1 tablet (25 mg total) by mouth daily. 90 tablet 3  . atorvastatin (LIPITOR) 20 MG tablet Take 1 tablet (20 mg total) by mouth daily at 6 PM. 90 tablet 3  . carbamazepine (TEGRETOL XR) 200 MG 12 hr tablet Take 1 tablet (200 mg total) by mouth 2 (two) times daily. 60 tablet 3  . diazepam (VALIUM) 5 MG tablet Take 5 mg by mouth as directed. Take 10mg  1 hour prior to procedure  0  . Diclofenac Sodium POWD   0  . famotidine (PEPCID) 20 MG tablet Take 1 tablet (20 mg total) by mouth 2 (two) times daily. 180 tablet 3  . Glucosamine-Chondroit-Vit C-Mn (GLUCOSAMINE 1500 COMPLEX PO) Take 2 tablets by mouth daily.    [name] glucose blood test strip check once daily and as needed 100 each 3  . HYDROcodone-acetaminophen (NORCO/VICODIN) 5-325 MG per tablet Take 1 tablet by mouth every 8 (eight) hours as needed for severe pain. 30 tablet 0  . levalbuterol (XOPENEX HFA) 45 MCG/ACT inhaler Inhale 1-2 puffs into the lungs every 6 (six) hours as needed for wheezing. 1 Inhaler 12  . lisinopril (PRINIVIL,ZESTRIL) 20 MG tablet TAKE 1 TABLET BY MOUTH DAILY 30 tablet 6  . ONETOUCH DELICA LANCETS 37S MISC Check blood sugar once a day and as directed. Dx. E11.8 100 each 3  . predniSONE (DELTASONE) 20 MG tablet Take two tablets daily for 3 days  followed by one tablet daily for 4 days 10 tablet 0  . vitamin B-12 (CYANOCOBALAMIN) 1000 MCG tablet Take 1 tablet (1,000 mcg total) by mouth daily.     No current facility-administered medications for this visit.    Allergies  Codeine  Electrocardiogram:   07/10/14 SR rate 53 normal   Assessment and Plan CAD:  Post CABG with persistent pain check non contrast CT to make sure sternum / wires healing well OSA:  Much improved with CPAP HTN:  BP lower on CPAP  Decrease  ACE to 10 mg daily Chol:  On statin   Cholesterol is at goal.  Continue current dose of statin and diet Rx.  No myalgias or side effects.  F/U  LFT's in 6 months. Lab Results  Component Value Date   LDLCALC 66 04/15/2014           CT chest today f/u me 6 months

## 2015-01-22 ENCOUNTER — Encounter: Payer: Self-pay | Admitting: Cardiovascular Disease

## 2015-01-22 ENCOUNTER — Ambulatory Visit (INDEPENDENT_AMBULATORY_CARE_PROVIDER_SITE_OTHER): Payer: BLUE CROSS/BLUE SHIELD | Admitting: Cardiovascular Disease

## 2015-01-22 ENCOUNTER — Ambulatory Visit (INDEPENDENT_AMBULATORY_CARE_PROVIDER_SITE_OTHER)
Admission: RE | Admit: 2015-01-22 | Discharge: 2015-01-22 | Disposition: A | Discharge status: Home / Self Care | Payer: BLUE CROSS/BLUE SHIELD | Source: Ambulatory Visit | Attending: Cardiovascular Disease | Admitting: Cardiovascular Disease

## 2015-01-22 VITALS — BP 98/56 | HR 54 | Ht 71.0 in | Wt 204.2 lb

## 2015-01-22 DIAGNOSIS — Z951 Presence of aortocoronary bypass graft: Secondary | ICD-10-CM

## 2015-01-22 MED ORDER — LISINOPRIL 10 MG PO TABS: 10.0000 mg | ORAL_TABLET | Freq: Every day | ORAL | Status: DC

## 2015-01-22 NOTE — Patient Instructions (Addendum)
Medication Instructions:  DECREASE LISINOPRIL TO  10 MG EVERY DAY Labwork: NONE  Testing/Procedures: CHEST  CT   S/P CABG   Follow-Up: Your physician wants you to follow-up in:   [place] will receive a reminder letter in the mail two months in advance. If you don't receive a letter, please call our office to schedule the follow-up appointment.   Any Other Special Instructions Will Be Listed Below (If Applicable).

## 2015-02-12 ENCOUNTER — Ambulatory Visit (INDEPENDENT_AMBULATORY_CARE_PROVIDER_SITE_OTHER): Payer: BLUE CROSS/BLUE SHIELD | Admitting: Primary Care

## 2015-02-12 ENCOUNTER — Encounter: Payer: Self-pay | Admitting: Primary Care

## 2015-02-12 VITALS — BP 120/76 | HR 64 | Temp 98.8°F | Ht 71.0 in | Wt 205.1 lb

## 2015-02-12 DIAGNOSIS — J029 Acute pharyngitis, unspecified: Secondary | ICD-10-CM

## 2015-02-12 LAB — POCT RAPID STREP A (OFFICE): Rapid Strep A Screen: NEGATIVE

## 2015-02-12 NOTE — Patient Instructions (Signed)
Stop by the front and speak with Rosaria Ferries regarding your referral to ENT.  You may take ibuprofen 600 mg three times daily for inflammation and pain.  Continue Nystatin and ensure you are rinsing out your mouth each time you use your inhaler.  It was a pleasure meeting you!

## 2015-02-12 NOTE — Progress Notes (Signed)
Subjective:    Patient ID: Carl Lee, male    DOB: [birth year], 59 y.o.   MRN: [id number]  HPI  Carl Lee is a 59 year old male who presents today with a chief complaint of sore throat. [facility]15 and was found to have strep throat. He was provided with a 10 day course of amoxicillin which he completed. [facility] returned [facility] due to sore throat and "white spots" to his pharynx. He was determined to have oral thrush and was provided with a prescription for nystatin. He uses daily Advair and endorses rinsing his mouth daily.  Currently he reports the "white spots" are still present and continues to experience soreness to his throat and tongue. Denies fevers and chills. He also reports fatigue, pain when swallowing, and changes to his voice.  Review of Systems  Constitutional: Positive for fatigue. Negative for fever and chills.  HENT: Positive for congestion, sore throat, trouble swallowing and voice change. Negative for ear discharge and sinus pressure.   Respiratory: Positive for cough. Negative for shortness of breath.   Cardiovascular: Negative for chest pain.  Musculoskeletal: Negative for myalgias.       Past Medical History  Diagnosis Date  . Bladder cancer 08/16/2008    [facility] [place] now [place])  . Hypertension   . AAA (abdominal aortic aneurysm) 03/25/12    s/p endovascular repair  . Hx of migraines   . Contact dermatitis     atypical [name])  . Childhood asthma   . Environmental allergies     improved as ages  . Diverticulosis 2013    severe by CT and colonoscopy  . Adenomatous polyp 06/2009  . Lumbosacral radiculopathy at S1 03/2012    left, with spinal stenosis (MRI 04/2013) improved with TF ESI L5/S1 and S1/2 ([facility] [place] [facility])  . Spinal stenosis     LS1 nerve root impingement from bulging disc  . Coronary artery disease   . Zenker diverticulum   . Resistance to clopidogrel 2015    drug metabolism panel run -  asked to scan  . OSA (obstructive sleep apnea) 09/04/2014    Severe with AHI 35/hr  . Vitamin B12 deficiency   . Diabetes mellitus type 2, controlled, with complications 86/76/7209    CAD   . COPD (chronic obstructive pulmonary disease)   . Diabetes mellitus without complication     Type II    History   Social History  . Marital Status: Married    Spouse Name: N/A  . Number of Children: 2  . Years of Education: N/A   Occupational History  . Self Employed    Social History Main Topics  . Smoking status: Former Smoker -- 1.00 packs/day for 40 years    Types: Cigarettes    Quit date: 02/05/2014  . Smokeless tobacco: Never Used  . Alcohol Use: 0.0 oz/week    0 Standard drinks or equivalent per week     Comment: Rare- states 1 oz. per week  . Drug Use: No  . Sexual Activity: Not on file   Other Topics Concern  . Not on file   Social History Narrative   Caffeine: 1 cup coffee/day   Lives with wife, 1 dog   grown children   Occupation: Therapist, sports - self employed.  Not working, applied for disability.    Edu: 2 yrs college   Activity: fishing, walking occasionally   Diet: good water, fruits/vegetables daily  Past Surgical History  Procedure Laterality Date  . Tonsillectomy and adenoidectomy  1973  . Vasectomy    . Bladder cancer  March 2010 and Oct. 2015    [facility]) x 2  . Abdominal aortic aneurysm repair  03/25/12  . Colonoscopy  06/2012    hyperplastic polyp, diverticulosis ([facility]) rec rpt 5 yrs  . Cystoscopy  08/16/08    Bladder Cancer  . Esi  04/2013, 06/2013    L L5S1, S12 transforaminal ESI (Dr.  [name])  . Abi  05/2013    WNL, L TBI low at 0.66  . Coronary artery bypass graft N/A 02/07/2014    Procedure: CORONARY ARTERY BYPASS GRAFTING (CABG);  Surgeon: [name], MD;  Location: [place];  Service: Open Heart Surgery;  Laterality: N/A;  CABG X 3, BILATERAL LIMA, EVH  . Intraoperative transesophageal echocardiogram N/A 02/07/2014      Procedure: INTRAOPERATIVE TRANSESOPHAGEAL ECHOCARDIOGRAM;  Surgeon: [name], MD;  Location: [place];  Service: Open Heart Surgery;  Laterality: N/A;  . Esi Left 04/2014, 05/2014, 06/2014    L5/S1, S1/2; rpt; L4/5  . Left heart catheterization with coronary angiogram N/A 02/01/2014    Procedure: LEFT HEART CATHETERIZATION WITH CORONARY ANGIOGRAM;  Surgeon: [name], MD;  Location: [facility] CATH LAB;  Service: Cardiovascular;  Laterality: N/A;  . Epidural block injection Left 09/2014, 10/2014, 12/2014    medial L2,3,4, dorsal L5 ramus blocks x2, L L5/S1 and S1/2 transforaminal ESI ([facility])    Family History  Problem Relation Age of Onset  . Diabetes Mother   . Arrhythmia Mother     pacemaker  . Cancer Mother     Bladder  . COPD Mother   . Thyroid disease Mother   . Hyperlipidemia Mother   . Hypertension Mother   . Diabetes Father   . CAD Father 77    CHF, MI  . Dementia Father   . Heart attack Father   . Diabetes Sister   . Colon cancer Neg Hx   . Diabetes Brother   . Hypertension Brother   . Heart attack Maternal Grandmother     Allergies  Allergen Reactions  . Codeine Other (See Comments)    Nightmares    Current Outpatient Prescriptions on File Prior to Visit  Medication Sig Dispense Refill  . ADVAIR DISKUS 250-50 MCG/DOSE AEPB Inhale 1 puff into the lungs daily.  4  . aspirin EC 325 MG EC tablet Take 1 tablet (325 mg total) by mouth daily.    [name] atenolol (TENORMIN) 25 MG tablet Take 1 tablet (25 mg total) by mouth daily. 90 tablet 3  . atorvastatin (LIPITOR) 20 MG tablet Take 1 tablet (20 mg total) by mouth daily at 6 PM. 90 tablet 3  . Diclofenac Sodium POWD   0  . famotidine (PEPCID) 20 MG tablet Take 1 tablet (20 mg total) by mouth 2 (two) times daily. 180 tablet 3  . Glucosamine-Chondroit-Vit C-Mn (GLUCOSAMINE 1500 COMPLEX PO) Take 2 tablets by mouth daily.    [name] glucose blood test strip check once daily and as needed 100 each 3  .  HYDROcodone-acetaminophen (NORCO/VICODIN) 5-325 MG per tablet Take 1 tablet by mouth every 8 (eight) hours as needed for severe pain. 30 tablet 0  . levalbuterol (XOPENEX HFA) 45 MCG/ACT inhaler Inhale 1-2 puffs into the lungs every 6 (six) hours as needed for wheezing. 1 Inhaler 12  . lisinopril (PRINIVIL,ZESTRIL) 10 MG tablet Take 1 tablet (10 mg total) by  mouth daily. 90 tablet 3  . ONETOUCH DELICA LANCETS 82U MISC Check blood sugar once a day and as directed. Dx. E11.8 100 each 3  . penicillin v potassium (VEETID) 500 MG tablet Take 500 mg by mouth 2 (two) times daily. for 10 days  0  . vitamin B-12 (CYANOCOBALAMIN) 1000 MCG tablet Take 1 tablet (1,000 mcg total) by mouth daily. (Patient not taking: Reported on 02/12/2015)     No current facility-administered medications on file prior to visit.    BP 120/76 mmHg  Pulse 64  Temp(Src) 98.8 F (37.1 C) (Oral)  Ht 5\' 11"  (1.803 m)  Wt 205 lb 1.9 oz (93.042 kg)  BMI 28.62 kg/m2  SpO2 97%    Objective:   Physical Exam  Constitutional: He appears well-nourished.  HENT:  Right Ear: Tympanic membrane and ear canal normal.  Left Ear: Tympanic membrane and ear canal normal.  Nose: Right sinus exhibits no maxillary sinus tenderness and no frontal sinus tenderness. Left sinus exhibits no maxillary sinus tenderness and no frontal sinus tenderness.  Mouth/Throat: Posterior oropharyngeal erythema present.  Minimal "white spots" to mouth, none present on tonsils. Suggestive of thrush.  Eyes: Conjunctivae are normal. Pupils are equal, round, and reactive to light.  Neck: Neck supple.  Throat slightly swollen to palpation down neck  Cardiovascular: Normal rate and regular rhythm.   Pulmonary/Chest: Effort normal and breath sounds normal.  Lymphadenopathy:    He has no cervical adenopathy.  Skin: Skin is warm and dry.          Assessment & Plan:  Sore throat:  Erythema with swelling to neck x 1 month. + strep in early July with  treatment, symptoms continued since. Resolving thrush to oral cavity, mild. Continue nystatin. Rapid Strep: Negative Culture sent. He recently had a joint injection on July 28th, will not do IM steroids today. Will send to ENT for further evaluation due to continued symptoms of pain and swelling without a clear reason.

## 2015-02-12 NOTE — Progress Notes (Signed)
Pre visit review using our clinic review tool, if applicable. No additional management support is needed unless otherwise documented below in the visit note. 

## 2015-02-13 LAB — CULTURE, GROUP A STREP: ORGANISM ID, BACTERIA: NORMAL

## 2015-04-15 ENCOUNTER — Ambulatory Visit (INDEPENDENT_AMBULATORY_CARE_PROVIDER_SITE_OTHER): Payer: BLUE CROSS/BLUE SHIELD | Admitting: Family Medicine

## 2015-04-15 ENCOUNTER — Encounter: Payer: Self-pay | Admitting: Family Medicine

## 2015-04-15 VITALS — BP 142/90 | HR 64 | Temp 98.4°F | Wt 209.5 lb

## 2015-04-15 DIAGNOSIS — J019 Acute sinusitis, unspecified: Secondary | ICD-10-CM | POA: Diagnosis not present

## 2015-04-15 DIAGNOSIS — R21 Rash and other nonspecific skin eruption: Secondary | ICD-10-CM

## 2015-04-15 DIAGNOSIS — C679 Malignant neoplasm of bladder, unspecified: Secondary | ICD-10-CM | POA: Diagnosis not present

## 2015-04-15 DIAGNOSIS — R351 Nocturia: Secondary | ICD-10-CM | POA: Diagnosis not present

## 2015-04-15 DIAGNOSIS — J449 Chronic obstructive pulmonary disease, unspecified: Secondary | ICD-10-CM

## 2015-04-15 DIAGNOSIS — Z87891 Personal history of nicotine dependence: Secondary | ICD-10-CM

## 2015-04-15 DIAGNOSIS — I10 Essential (primary) hypertension: Secondary | ICD-10-CM

## 2015-04-15 LAB — POCT URINALYSIS DIPSTICK
GLUCOSE UA: NEGATIVE
KETONES UA: NEGATIVE
Leukocytes, UA: NEGATIVE
Nitrite, UA: NEGATIVE
RBC UA: NEGATIVE
Urobilinogen, UA: 0.2
pH, UA: 5.5

## 2015-04-15 MED ORDER — AMOXICILLIN-POT CLAVULANATE 875-125 MG PO TABS: 1.0000 | ORAL_TABLET | Freq: Two times a day (BID) | ORAL | Status: AC

## 2015-04-15 MED ORDER — BENZONATATE 100 MG PO CAPS: 100.0000 mg | ORAL_CAPSULE | Freq: Two times a day (BID) | ORAL | Status: DC | PRN

## 2015-04-15 NOTE — Progress Notes (Signed)
Pre visit review using our clinic review tool, if applicable. No additional management support is needed unless otherwise documented below in the visit note. 

## 2015-04-15 NOTE — Patient Instructions (Addendum)
Call your insurance about the shingles shot to see if it is covered or how much it would cost and where is cheaper (here or pharmacy).  If you want to receive here, call for nurse visit.  For blood pressure - cut atenolol to 12.5mg  twice daily, change lisinopril to 10mg  in am. Let me know how blood pressure running with this change.  You have a sinus infection.  Take medicine as prescribed: augmentin antibiotic for 10 days. Push fluids and plenty of rest. Nasal saline irrigation or neti pot to help drain sinuses. May use plain mucinex with plenty of fluid to help mobilize mucous. Please let us know if fever >101.5, trouble opening/closing mouth, difficulty swallowing, or worsening instead of improving as expected. Tessalon perls for cough. Try lotrimin for rash on chest wall (over the counter)

## 2015-04-15 NOTE — Assessment & Plan Note (Signed)
Labile hypertension with recent fluctuating readings. Will change antihypertensive regimen to atenolol 12.5mg  bid + lisinopril 10mg  in am. Update [place] with effect.

## 2015-04-15 NOTE — Progress Notes (Signed)
BP 142/90 mmHg  Pulse 64  Temp(Src) 98.4 F (36.9 C) (Oral)  Wt 209 lb 8 oz (95.029 kg)  SpO2 96%   CC: mult issues  Subjective:    Patient ID: Carl Lee, male    DOB: Sep 02, 1955, 59 y.o.   MRN: [id number]  HPI: TELVIN REINDERS is a 59 y.o. male presenting on 04/15/2015 for URI; Hypertension; Rash; and Nocturia   3 d h/o cough, congestion, marked rhinorrhea, sneezing and sore throat. Causing R sided headcahe. H/o PNA 2015. Taking echinacea.   Blood pressure has been fluctuating over last 3 wks. Peak 341 systolic, nadir 93/79. Complaint with lisinopril 10mg  daily 6pm and atenolol 25mg  daily 6am. No tachycardia or palpitations.  BP Readings from Last 3 Encounters:  04/15/15 142/90  02/12/15 120/76  01/22/15 98/56     Skin rash noted L chest wall over past week. He recently had strep throat complicated by thrush infection afterwards.  No new medicines, foods. No new lotions, detergents, soaps or shampoos.   Intermittent nocturia noted over last month.    Continues sneaking cigarettes occasionally.  Relevant past medical, surgical, family and social history reviewed and updated as indicated. Interim medical history since our last visit reviewed. Allergies and medications reviewed and updated. Current Outpatient Prescriptions on File Prior to Visit  Medication Sig  . aspirin EC 325 MG EC tablet Take 1 tablet (325 mg total) by mouth daily.  [name] atorvastatin (LIPITOR) 20 MG tablet Take 1 tablet (20 mg total) by mouth daily at 6 PM.  . Diclofenac Sodium POWD   . famotidine (PEPCID) 20 MG tablet Take 1 tablet (20 mg total) by mouth 2 (two) times daily.  . Glucosamine-Chondroit-Vit C-Mn (GLUCOSAMINE 1500 COMPLEX PO) Take 2 tablets by mouth daily.  [name] glucose blood test strip check once daily and as needed  . HYDROcodone-acetaminophen (NORCO/VICODIN) 5-325 MG per tablet Take 1 tablet by mouth every 8 (eight) hours as needed for severe pain.  [name] levalbuterol (XOPENEX HFA) 45 MCG/ACT  inhaler Inhale 1-2 puffs into the lungs every 6 (six) hours as needed for wheezing.  [name] lisinopril (PRINIVIL,ZESTRIL) 10 MG tablet Take 1 tablet (10 mg total) by mouth daily.  Glory Rosebush DELICA LANCETS 02I MISC Check blood sugar once a day and as directed. Dx. E11.8  . vitamin B-12 (CYANOCOBALAMIN) 1000 MCG tablet Take 1 tablet (1,000 mcg total) by mouth daily.   No current facility-administered medications on file prior to visit.    Review of Systems Per HPI unless specifically indicated above     Objective:    BP 142/90 mmHg  Pulse 64  Temp(Src) 98.4 F (36.9 C) (Oral)  Wt 209 lb 8 oz (95.029 kg)  SpO2 96%  Wt Readings from Last 3 Encounters:  04/15/15 209 lb 8 oz (95.029 kg)  02/12/15 205 lb 1.9 oz (93.042 kg)  01/22/15 204 lb 3.2 oz (92.625 kg)    Physical Exam  Constitutional: He appears well-developed and well-nourished. No distress.  HENT:  Head: Normocephalic and atraumatic.  Right Ear: Hearing, tympanic membrane, external ear and ear canal normal.  Left Ear: Hearing, tympanic membrane, external ear and ear canal normal.  Nose: No mucosal edema or rhinorrhea. Right sinus exhibits maxillary sinus tenderness and frontal sinus tenderness. Left sinus exhibits maxillary sinus tenderness and frontal sinus tenderness.  Mouth/Throat: Uvula is midline and mucous membranes are normal. Posterior oropharyngeal edema and posterior oropharyngeal erythema present. No oropharyngeal exudate or tonsillar abscesses.  Nasal mucosal inflammation  Eyes:  Conjunctivae and EOM are normal. Pupils are equal, round, and reactive to light. No scleral icterus.  Neck: Normal range of motion. Neck supple.  Cardiovascular: Normal rate, regular rhythm, normal heart sounds and intact distal pulses.   No murmur heard. Pulmonary/Chest: Effort normal and breath sounds normal. No respiratory distress. He has no wheezes. He has no rales.  Coarse breath sounds  Lymphadenopathy:    He has no cervical  adenopathy.  Skin: Skin is warm and dry. Rash noted.  Splotchy papular rash L chest wall  Nursing note and vitals reviewed.  Results for orders placed or performed in visit on 04/15/15  POCT Urinalysis Dipstick  Result Value Ref Range   Color, UA Yellow    Clarity, UA Clear    Glucose, UA Negative    Bilirubin, UA 1+    Ketones, UA Negative    Spec Grav, UA >=1.030    Blood, UA Negative    pH, UA 5.5    Protein, UA Trace    Urobilinogen, UA 0.2    Nitrite, UA Negative    Leukocytes, UA Negative Negative      Assessment & Plan:   Problem List Items Addressed This Visit    Skin rash    Anticipate fungal dermatitis - treat with OTC lotrimin cream.      Hypertension    Labile hypertension with recent fluctuating readings. Will change antihypertensive regimen to atenolol 12.5mg  bid + lisinopril 10mg  in am. Update [place] with effect.      Relevant Medications   atenolol (TENORMIN) 25 MG tablet   Ex-smoker    Continues sneaking cigarettes. Advised full cessation.      COPD, moderate ([place])    Continue to encourage full smoking cessation.      Relevant Medications   fluticasone-salmeterol (ADVAIR HFA) 115-21 MCG/ACT inhaler   benzonatate (TESSALON) 100 MG capsule   Bladder cancer ([place])    Had rpt ablation surgery late 2015      Acute sinusitis - Primary    Anticipate has developed acute rhinosinusitis, possibly viral as short duration. However given comorbidities (smoker, COPD, h/o CABG) will treat aggressively today with augmentin course. Tessalon perls for cough, rec plain mucinex. Update if not improving with treatmetn.      Relevant Medications   amoxicillin-clavulanate (AUGMENTIN) 875-125 MG tablet   benzonatate (TESSALON) 100 MG capsule    Other Visit Diagnoses    Nocturia        Relevant Orders    POCT Urinalysis Dipstick (Completed)        Follow up plan: Return if symptoms worsen or fail to improve.

## 2015-04-15 NOTE — Assessment & Plan Note (Signed)
Anticipate fungal dermatitis - treat with OTC lotrimin cream.

## 2015-04-15 NOTE — Assessment & Plan Note (Addendum)
Anticipate has developed acute rhinosinusitis, possibly viral as short duration. However given comorbidities (smoker, COPD, h/o CABG) will treat aggressively today with augmentin course. Tessalon perls for cough, rec plain mucinex. Update if not improving with treatmetn.

## 2015-04-15 NOTE — Assessment & Plan Note (Signed)
Continue to encourage full smoking cessation. 

## 2015-04-15 NOTE — Assessment & Plan Note (Addendum)
Had rpt ablation surgery late 2015

## 2015-04-15 NOTE — Assessment & Plan Note (Addendum)
Continues sneaking cigarettes. Advised full cessation.

## 2015-05-01 ENCOUNTER — Encounter: Payer: Self-pay | Admitting: Pulmonary Disease

## 2015-05-01 ENCOUNTER — Ambulatory Visit (INDEPENDENT_AMBULATORY_CARE_PROVIDER_SITE_OTHER)
Admission: RE | Admit: 2015-05-01 | Discharge: 2015-05-01 | Disposition: A | Discharge status: Home / Self Care | Payer: BLUE CROSS/BLUE SHIELD | Source: Ambulatory Visit | Attending: Pulmonary Disease | Admitting: Pulmonary Disease

## 2015-05-01 ENCOUNTER — Ambulatory Visit (INDEPENDENT_AMBULATORY_CARE_PROVIDER_SITE_OTHER): Payer: BLUE CROSS/BLUE SHIELD | Admitting: Pulmonary Disease

## 2015-05-01 VITALS — BP 126/78 | HR 60 | Ht 71.0 in | Wt 209.0 lb

## 2015-05-01 DIAGNOSIS — R0600 Dyspnea, unspecified: Secondary | ICD-10-CM | POA: Diagnosis not present

## 2015-05-01 DIAGNOSIS — Z23 Encounter for immunization: Secondary | ICD-10-CM

## 2015-05-01 DIAGNOSIS — R0602 Shortness of breath: Secondary | ICD-10-CM

## 2015-05-01 DIAGNOSIS — R0609 Other forms of dyspnea: Secondary | ICD-10-CM | POA: Insufficient documentation

## 2015-05-01 MED ORDER — LEVALBUTEROL TARTRATE 45 MCG/ACT IN AERO: 1.0000 | INHALATION_SPRAY | Freq: Four times a day (QID) | RESPIRATORY_TRACT | Status: DC | PRN

## 2015-05-01 MED ORDER — FLUTTER DEVI: Status: DC

## 2015-05-01 MED ORDER — AEROCHAMBER MV MISC: Status: DC

## 2015-05-01 NOTE — Assessment & Plan Note (Signed)
He is still smoking some.  I counseled him at length today to quit

## 2015-05-01 NOTE — Patient Instructions (Signed)
Use your Advair with the spacer, 2 puffs twice a day Use the flutter valve to help clear mucus out of your chest, I recommend 4-5 breaths 4-5 times a day Take Mucinex twice a day Exercises much as possible We will see you back in 6 months or sooner if needed

## 2015-05-01 NOTE — Addendum Note (Signed)
Addended by: Len Blalock on: 05/01/2015 03:26 PM   Modules accepted: Orders

## 2015-05-01 NOTE — Addendum Note (Signed)
Addended by: Len Blalock on: 05/01/2015 05:25 PM   Modules accepted: Orders

## 2015-05-01 NOTE — Assessment & Plan Note (Signed)
10 he had an exacerbation of COPD which has been slow to progress. He has moderate airflow obstruction but he scattered fairly severe symptoms. Today on exam there is no wheezing but he does have crackles in the left base. I worry that he may of had mild pneumonia based on these exam findings.  Further, he continues to smoke which contributes to the severity of his COPD symptoms. He was counseled today at length to quit smoking  Plan: I'm going to try to help alleviate chest congestion by recommending that he take Mucinex and use a flutter valve twice a day, hopefully this will help with dyspnea Take the Advair with a spacer for better drug delivery Chest x-ray today to ensure no evidence of pneumonia Exercise as much as possible Stay away from cigarettes Follow-up 6 months or sooner if needed

## 2015-05-01 NOTE — Progress Notes (Signed)
Subjective:    Patient ID: Carl Lee, male    DOB: [birth year], 59 y.o.   MRN: [id number]  Synopsis: Mr. holley had a CABG in 2015 and pulmonary critical care medicine was consulted for a COPD exacerbation. July 2015 lung function testing showed a ratio 62%, FEV1 of 2.10 L (56% predicted), total lung capacity 6.35 L (80% predicted). He smoked for many years and quit smoking in 2015.  HPI  Chief Complaint  Patient presents with  . Follow-up    pt c/o worsening DOE, chest tightness with exertion-unsure if related to lungs or heart.     Lowery had a sinus infection a month ago and since then hs breathing hasn't quite been the same.  He had to use his rescue inhaler a lot during that time.  He says things quite aren't as good as they need to be.  He got winded up through the parking lot today which is worse than before.  He had a lot of chest pain again since surgery. He says that left sided chest pain has persisted.  He continues to take Advair HFA form, two puffs.   He is still coughing up green mucus in the mornings.     Past Medical History  Diagnosis Date  . Bladder cancer ([place]) 08/16/2008    [facility] [place] [facility] [place])  . Hypertension   . AAA (abdominal aortic aneurysm) ([place]) 03/25/12    s/p endovascular repair  . Hx of migraines   . Contact dermatitis     atypical [name])  . Childhood asthma   . Environmental allergies     improved as ages  . Diverticulosis 2013    severe by CT and colonoscopy  . Adenomatous polyp 06/2009  . Lumbosacral radiculopathy at S1 03/2012    left, with spinal stenosis (MRI 04/2013) improved with TF ESI L5/S1 and S1/2 ([facility])  . Spinal stenosis     LS1 nerve root impingement from bulging disc  . Coronary artery disease   . Zenker diverticulum   . Resistance to clopidogrel 2015    drug metabolism panel run - asked to scan  . OSA (obstructive sleep apnea) 09/04/2014    Severe with AHI 35/hr  . Vitamin B12 deficiency   . Diabetes  mellitus type 2, controlled, with complications ([place]) 24/26/8341    CAD   . COPD (chronic obstructive pulmonary disease) ([place])   . Diabetes mellitus without complication ([place])     Type II     Review of Systems  Constitutional: Positive for fatigue. Negative for fever and chills.  HENT: Negative for postnasal drip, rhinorrhea and sinus pressure.   Respiratory: Positive for cough. Negative for shortness of breath and wheezing.   Cardiovascular: Negative for chest pain, palpitations and leg swelling.       Objective:   Physical Exam Filed Vitals:   05/01/15 1436  BP: 126/78  Pulse: 60  Height: 5\' 11"  (1.803 m)  Weight: 209 lb (94.802 kg)  SpO2: 96%   RA  Gen: well appearing, no acute distress HEENT: NCAT, EOMi, OP clear,  PULM: Crackles left base CV: RRR, no mgr, no JVD GI: BS+, soft, nontender, MSK: normal bulk and tone Derm: no rash or skin breakdown Neuro: A&Ox4, CN II-XII intact, MAEW  Primary care records reviewed where he was treated for a sinus infection with Augmentin  Strep test 02/2015 no growth     Assessment & Plan:   COPD, moderate ([place]) 10 he had an exacerbation  of COPD which has been slow to progress. He has moderate airflow obstruction but he scattered fairly severe symptoms. Today on exam there is no wheezing but he does have crackles in the left base. I worry that he may of had mild pneumonia based on these exam findings.  Further, he continues to smoke which contributes to the severity of his COPD symptoms. He was counseled today at length to quit smoking  Plan: I'm going to try to help alleviate chest congestion by recommending that he take Mucinex and use a flutter valve twice a day, hopefully this will help with dyspnea Take the Advair with a spacer for better drug delivery Chest x-ray today to ensure no evidence of pneumonia Exercise as much as possible Stay away from cigarettes Follow-up 6 months or sooner if needed  Ex-smoker He is still  smoking some.  I counseled him at length today to quit  Dyspnea This has been worsening recently. As noted above I think it's primarily related to his COPD and ongoing chest congestion. Has noted above were going to see if we can help clear up some of the chest congestion.    Deconditioning also contributes to have asked him to exercise more.    Finally, because of some mild chest pain (which I think is chronic and related to surgery) I've asked him to go back and see cardiology again.    Updated Medication List Outpatient Encounter Prescriptions as of 05/01/2015  Medication Sig  . aspirin EC 325 MG EC tablet Take 1 tablet (325 mg total) by mouth daily.  [name] atenolol (TENORMIN) 25 MG tablet Take 0.5 tablets (12.5 mg total) by mouth 2 (two) times daily.  [name] atorvastatin (LIPITOR) 20 MG tablet Take 1 tablet (20 mg total) by mouth daily at 6 PM.  . Diclofenac Sodium POWD   . famotidine (PEPCID) 20 MG tablet Take 1 tablet (20 mg total) by mouth 2 (two) times daily.  . fluticasone-salmeterol (ADVAIR HFA) 115-21 MCG/ACT inhaler Inhale 2 puffs into the lungs 2 (two) times daily.  [name] glucose blood test strip check once daily and as needed  . HYDROcodone-acetaminophen (NORCO/VICODIN) 5-325 MG per tablet Take 1 tablet by mouth every 8 (eight) hours as needed for severe pain.  [name] lisinopril (PRINIVIL,ZESTRIL) 10 MG tablet Take 1 tablet (10 mg total) by mouth daily.  Glory Rosebush DELICA LANCETS 32G MISC Check blood sugar once a day and as directed. Dx. E11.8  . vitamin B-12 (CYANOCOBALAMIN) 1000 MCG tablet Take 1 tablet (1,000 mcg total) by mouth daily.  [name] levalbuterol (XOPENEX HFA) 45 MCG/ACT inhaler Inhale 1-2 puffs into the lungs every 6 (six) hours as needed for wheezing. (Patient not taking: Reported on 05/01/2015)  . Respiratory Therapy Supplies (FLUTTER) DEVI Use as directed  . Spacer/Aero-Holding Chambers (AEROCHAMBER MV) inhaler Use as instructed  . [DISCONTINUED] benzonatate (TESSALON) 100 MG  capsule Take 1 capsule (100 mg total) by mouth 2 (two) times daily as needed for cough. (Patient not taking: Reported on 05/01/2015)  . [DISCONTINUED] Glucosamine-Chondroit-Vit C-Mn (GLUCOSAMINE 1500 COMPLEX PO) Take 2 tablets by mouth daily.   No facility-administered encounter medications on file as of 05/01/2015.

## 2015-05-01 NOTE — Assessment & Plan Note (Signed)
This has been worsening recently. As noted above I think it's primarily related to his COPD and ongoing chest congestion. Has noted above were going to see if we can help clear up some of the chest congestion.    Deconditioning also contributes to have asked him to exercise more.    Finally, because of some mild chest pain (which I think is chronic and related to surgery) I've asked him to go back and see cardiology again.

## 2015-05-02 ENCOUNTER — Telehealth: Payer: Self-pay | Admitting: Pulmonary Disease

## 2015-05-02 NOTE — Telephone Encounter (Signed)
Spoke with pt, states that xopenex hfa needs a PA. Called pharmacy, this is being faxed to the office.  Will await fax.  Pt also requesting cxr results from yesterday.  BQ please advise on cxr.  Thanks!

## 2015-05-02 NOTE — Telephone Encounter (Signed)
I'm OK with prior auth CXR looks OK, mild area in his right lung where it is not fully expanding from pain but no pneumonia

## 2015-05-05 ENCOUNTER — Telehealth: Payer: Self-pay | Admitting: Pulmonary Disease

## 2015-05-05 MED ORDER — DOXYCYCLINE HYCLATE 100 MG PO TABS: 100.0000 mg | ORAL_TABLET | Freq: Two times a day (BID) | ORAL | Status: DC

## 2015-05-05 MED ORDER — PREDNISONE 10 MG PO TABS: ORAL_TABLET | ORAL | Status: DC

## 2015-05-05 NOTE — Telephone Encounter (Signed)
Pt is aware of results below. He reports he is still feeling very SOB-can barely do any activity, still wheezing, has prod cough (light green-grey phlem). He is using flutter valve 6-8 times a day and taking mucinex BID.  No PA has been received for xopenex. Called pharm to have this refaxed. BQ not available. Please advise RA thanks

## 2015-05-05 NOTE — Telephone Encounter (Signed)
Please send script for doxycycline 100 mg bid, #14 with no refills.  Send script for prednisone 10 mg pills >> 3 pills daily for 2 days, 2 pills daily for 2 days, 1 pill daily for 2 days.  #12 with no refills.

## 2015-05-05 NOTE — Telephone Encounter (Signed)
Spoke with pt and wife, aware of recs.  rx's sent in.  Nothing further needed.

## 2015-05-05 NOTE — Telephone Encounter (Signed)
Ra left w/o doing DOD message. Will send to VS. Please advise thanks

## 2015-05-05 NOTE — Telephone Encounter (Signed)
Initiated PA for Levalbuterol HFA thru Cover My Meds. Key: [place] for review. Will await response.

## 2015-05-08 MED ORDER — ALBUTEROL SULFATE HFA 108 (90 BASE) MCG/ACT IN AERS: 2.0000 | INHALATION_SPRAY | Freq: Four times a day (QID) | RESPIRATORY_TRACT | Status: DC | PRN

## 2015-05-08 NOTE — Telephone Encounter (Signed)
Pt is aware of medication change. Rx has been sent in. Nothing further was needed. 

## 2015-05-08 NOTE — Telephone Encounter (Signed)
Pt's Levalbuterol HFA was denied. Insurance states pot must use Proair HFA.

## 2015-05-08 NOTE — Telephone Encounter (Signed)
OK 

## 2015-05-16 ENCOUNTER — Encounter: Payer: Self-pay | Admitting: Gastroenterology

## 2015-06-12 ENCOUNTER — Other Ambulatory Visit: Payer: Self-pay | Admitting: Family Medicine

## 2015-06-12 DIAGNOSIS — K760 Fatty (change of) liver, not elsewhere classified: Secondary | ICD-10-CM

## 2015-06-12 HISTORY — DX: Fatty (change of) liver, not elsewhere classified: K76.0

## 2015-06-18 NOTE — Progress Notes (Signed)
Cardiology Office Note   Date:  06/19/2015   ID:  Carl Lee, [place] June 22, 1956, MRN [id number]  PCP:  [name], MD    Chief Complaint  Patient presents with  . Sleep Apnea  . Hypertension      History of Present Illness: This is a 59yo WM with a history of HTN, severe OSA with an AHI of 35 events per hour with oxygen desaturations as low ast 81% on CPAP at 13cm H2O. He is doing well with his CPAP device. He tolerates his nasal pillow mask and feels the pressure is adequate. His daytime sleepiness has improved since starting the CPAP but still feels unrested when he gets up in the am. He falls asleep on the couch around 8 and then get up at 10 and goes to bed and gets up at [place].  He says that his wife has still noticed him snoring on occasion and still have problems stopping breathing. He has had some problems with dry mouth as well.  He has not been able to use the chin strap.  He did not tolerate the full face mask.  He is complaining of recent onset of exertional chest discomfort that is like a toothache and eventually resovles on its own.  He says that he does not exert himself much because it brings on the pain.  It usually lasts around 10 minutes and resolves.  He also has chronic chest wall pain since his CABG which is unchanged.     Past Medical History  Diagnosis Date  . Bladder cancer ([place]) 08/16/2008    [facility] then [place] now [place])  . Hypertension   . AAA (abdominal aortic aneurysm) ([place]) 03/25/12    s/p endovascular repair  . Hx of migraines   . Contact dermatitis     atypical [name])  . Childhood asthma   . Environmental allergies     improved as ages  . Diverticulosis 2013    severe by CT and colonoscopy  . Adenomatous polyp 06/2009  . Lumbosacral radiculopathy at S1 03/2012    left, with spinal stenosis (MRI 04/2013) improved with TF ESI L5/S1 and S1/2 ([facility])  . Spinal stenosis     LS1 nerve root impingement from  bulging disc  . Coronary artery disease   . [place]ker diverticulum   . Resistance to clopidogrel 2015    drug metabolism panel run - asked to scan  . OSA (obstructive sleep apnea) 09/04/2014    Severe with AHI 35/hr  . Vitamin B12 deficiency   . Diabetes mellitus type 2, controlled, with complications ([place]) [id number]    CAD   . COPD (chronic obstructive pulmonary disease) ([place])   . Diabetes mellitus without complication ([place])     Type II    Past Surgical History  Procedure Laterality Date  . Tonsillectomy and adenoidectomy  1973  . Vasectomy    . Bladder cancer  March 2010 and Oct. 2015    [facility]) x 2  . Abdominal aortic aneurysm repair  03/25/12  . Colonoscopy  06/2012    hyperplastic polyp, diverticulosis ([place]) rec rpt 5 yrs  . Cystoscopy  08/16/08    Bladder Cancer  . Esi  04/2013, 06/2013    L L5S1, S12 transforaminal ESI (Dr.  [name])  . Abi  05/2013    WNL, L TBI low at 0.66  . Coronary artery bypass graft N/A  02/07/2014    Procedure: CORONARY ARTERY BYPASS GRAFTING (CABG);  Surgeon: [name], MD;  Location: [facility];  Service: Open Heart Surgery;  Laterality: N/A;  CABG X 3, BILATERAL LIMA, EVH  . Intraoperative transesophageal echocardiogram N/A 02/07/2014    Procedure: INTRAOPERATIVE TRANSESOPHAGEAL ECHOCARDIOGRAM;  Surgeon: [name], MD;  Location: [facility];  Service: Open Heart Surgery;  Laterality: N/A;  . Esi Left 04/2014, 05/2014, 06/2014    L5/S1, S1/2; rpt; L4/5  . Left heart catheterization with coronary angiogram N/A 02/01/2014    Procedure: LEFT HEART CATHETERIZATION WITH CORONARY ANGIOGRAM;  Surgeon: [name], MD;  Location: [facility] C[facility] LAB;  Service: Cardiovascular;  Laterality: N/A;  . Epidural block injection Left 09/2014, 10/2014, 12/2014    medial L2,3,4, dorsal L5 ramus blocks x2, L L5/S1 and S1/2 transforaminal ESI [name])     Current Outpatient Prescriptions  Medication Sig Dispense Refill  . albuterol  (PROAIR HFA) 108 (90 BASE) MCG/ACT inhaler Inhale 2 puffs into the lungs every 6 (six) hours as needed for wheezing or shortness of breath. 1 Inhaler 2  . aspirin EC 325 MG EC tablet Take 1 tablet (325 mg total) by mouth daily.    [name] atenolol (TENORMIN) 25 MG tablet Take 0.5 tablets (12.5 mg total) by mouth 2 (two) times daily.    [name] atorvastatin (LIPITOR) 20 MG tablet Take 1 tablet (20 mg total) by mouth daily at 6 PM. 90 tablet 3  . Diclofenac Sodium POWD   0  . famotidine (PEPCID) 20 MG tablet TAKE 1 TABLET (20 MG TOTAL) BY MOUTH 2 (TWO) TIMES DAILY. 180 tablet 3  . fluticasone-salmeterol (ADVAIR HFA) 115-21 MCG/ACT inhaler Inhale 2 puffs into the lungs 2 (two) times daily.    [name] glucose blood test strip check once daily and as needed 100 each 3  . HYDROcodone-acetaminophen (NORCO/VICODIN) 5-325 MG per tablet Take 1 tablet by mouth every 8 (eight) hours as needed for severe pain. 30 tablet 0  . levalbuterol (XOPENEX HFA) 45 MCG/ACT inhaler Inhale 1-2 puffs into the lungs every 6 (six) hours as needed for wheezing. 1 Inhaler 12  . lisinopril (PRINIVIL,ZESTRIL) 10 MG tablet Take 1 tablet (10 mg total) by mouth daily. 90 tablet 3  . ONETOUCH DELICA LANCETS 99991111 MISC Check blood sugar once a day and as directed. Dx. E11.8 100 each 3  . Respiratory Therapy Supplies (FLUTTER) DEVI Use as directed 1 each 0  . Spacer/Aero-Holding Chambers (AEROCHAMBER MV) inhaler Use as instructed 1 each 0  . vitamin B-12 (CYANOCOBALAMIN) 1000 MCG tablet Take 1 tablet (1,000 mcg total) by mouth daily.     No current facility-administered medications for this visit.    Allergies:   Codeine    Social History:  The patient  reports that he quit smoking about 16 months ago. His smoking use included Cigarettes. He has a 40 pack-year smoking history. He has never used smokeless tobacco. He reports that he drinks alcohol. He reports that he does not use illicit drugs.   Family History:  The patient's family history includes  Arrhythmia in his mother; CAD (age of onset: 76) in his father; COPD in his mother; Cancer in his mother; Dementia in his father; Diabetes in his brother, father, mother, and sister; Heart attack in his father and maternal grandmother; Hyperlipidemia in his mother; Hypertension in his brother and mother; Thyroid disease in his mother. There is no history of Colon cancer.    ROS:  Please see the history  of present illness.   Otherwise, review of systems are positive for chronic chest wall pain that is unchanged since his CABG.     All other systems are reviewed and negative.    PHYSICAL EXAM: VS:  BP 130/80 mmHg  Pulse 56  Ht 5\' 11"  (1.803 m)  Wt 210 lb (95.255 kg)  BMI 29.30 kg/m2 , BMI Body mass index is 29.3 kg/(m^2). GEN: Well nourished, well developed, in no acute distress HEENT: normal Neck: no JVD, carotid bruits, or masses Cardiac: RRR; no murmurs, rubs, or gallops,no edema  Respiratory:  clear to auscultation bilaterally, normal work of breathing GI: soft, nontender, nondistended, + BS MS: no deformity or atrophy Skin: warm and dry, no rash Neuro:  Strength and sensation are intact Psych: euthymic mood, full affect   EKG:  EKG is not ordered today.    Recent Labs: 11/29/2014: ALT 7; BUN 21; Creatinine, Ser 0.77; Hemoglobin 15.3; Platelets 280.0; Potassium 4.3; Sodium 140; TSH 1.50    Lipid Panel    Component Value Date/Time   CHOL 140 04/15/2014 0910   CHOL 170 01/05/2012   TRIG 168.0* 04/15/2014 0910   TRIG 141 01/05/2012   HDL 40.70 04/15/2014 0910   CHOLHDL 3 04/15/2014 0910   VLDL 33.6 04/15/2014 0910   LDLCALC 66 04/15/2014 0910   LDLDIRECT 105 01/05/2012      Wt Readings from Last 3 Encounters:  06/19/15 210 lb (95.255 kg)  05/01/15 209 lb (94.802 kg)  04/15/15 209 lb 8 oz (95.029 kg)     ASSESSMENT AND PLAN:  1. Severe OSA now on CPAP and doing well. His d/l today showed an AHI of 4.7/hr on 13cm H2O and 97% compliance in using more than 4 hours  nightly.  Since he is still having some snoring and feeling tired in the am, I will get a 2 week autotitration from 4 to 20cm H2O to see if pressure needs to be increased.   2. HTN - controlled on Atenolol/ACE I 3.  Chest pain that is exertional and worrisome for angina.  This is different from his chronic chest wall pain he has had since his CABG.  I sill get a lexiscan myoview to assess for ischemia.      Current medicines are reviewed at length with the patient today.  The patient does not have concerns regarding medicines.  The following changes have been made:  no change  Labs/ tests ordered today: See above Assessment and Plan No orders of the defined types were placed in this encounter.     Disposition:   FU with me in 1 year  Signed, Sueanne Margarita, MD  06/19/2015 9:14 AM    [facility] [address], [place], [place]  [address] Phone: [phone]; Fax: [phone]

## 2015-06-19 ENCOUNTER — Other Ambulatory Visit: Payer: Self-pay | Admitting: Family Medicine

## 2015-06-19 ENCOUNTER — Ambulatory Visit (INDEPENDENT_AMBULATORY_CARE_PROVIDER_SITE_OTHER): Payer: BLUE CROSS/BLUE SHIELD | Admitting: Cardiology

## 2015-06-19 ENCOUNTER — Encounter: Payer: Self-pay | Admitting: Cardiology

## 2015-06-19 VITALS — BP 130/80 | HR 56 | Ht 71.0 in | Wt 210.0 lb

## 2015-06-19 DIAGNOSIS — I10 Essential (primary) hypertension: Secondary | ICD-10-CM

## 2015-06-19 DIAGNOSIS — G4733 Obstructive sleep apnea (adult) (pediatric): Secondary | ICD-10-CM

## 2015-06-19 DIAGNOSIS — E118 Type 2 diabetes mellitus with unspecified complications: Secondary | ICD-10-CM

## 2015-06-19 DIAGNOSIS — R079 Chest pain, unspecified: Secondary | ICD-10-CM

## 2015-06-19 DIAGNOSIS — Z125 Encounter for screening for malignant neoplasm of prostate: Secondary | ICD-10-CM

## 2015-06-19 DIAGNOSIS — E785 Hyperlipidemia, unspecified: Secondary | ICD-10-CM

## 2015-06-19 NOTE — Patient Instructions (Signed)
Medication Instructions:  Your physician recommends that you continue on your current medications as directed. Please refer to the Current Medication list given to you today.   Labwork: None  Testing/Procedures: Your physician has requested that you have a lexiscan myoview. For further information please visit [email or web address]. Please follow instruction sheet, as given.  Dr. Radford Pax recommends you have a 2 week auto-titration on your CPAP. [facility] will be in touch with you to arrange this.  Follow-Up: Your physician wants you to follow-up in: 1 year with Dr. Radford Pax. You will receive a reminder letter in the mail two months in advance. If you don't receive a letter, please call our office to schedule the follow-up appointment.   Any Other Special Instructions Will Be Listed Below (If Applicable).     If you need a refill on your cardiac medications before your next appointment, please call your pharmacy.

## 2015-06-20 ENCOUNTER — Other Ambulatory Visit: Payer: Self-pay | Admitting: Family Medicine

## 2015-06-20 ENCOUNTER — Other Ambulatory Visit (INDEPENDENT_AMBULATORY_CARE_PROVIDER_SITE_OTHER): Payer: BLUE CROSS/BLUE SHIELD

## 2015-06-20 DIAGNOSIS — E118 Type 2 diabetes mellitus with unspecified complications: Secondary | ICD-10-CM | POA: Diagnosis not present

## 2015-06-20 DIAGNOSIS — Z125 Encounter for screening for malignant neoplasm of prostate: Secondary | ICD-10-CM | POA: Diagnosis not present

## 2015-06-20 DIAGNOSIS — I10 Essential (primary) hypertension: Secondary | ICD-10-CM

## 2015-06-20 DIAGNOSIS — E785 Hyperlipidemia, unspecified: Secondary | ICD-10-CM | POA: Diagnosis not present

## 2015-06-20 LAB — HEMOGLOBIN A1C: HEMOGLOBIN A1C: 6.1 % (ref 4.6–6.5)

## 2015-06-20 LAB — BASIC METABOLIC PANEL
BUN: 24 mg/dL — AB (ref 6–23)
CHLORIDE: 108 meq/L (ref 96–112)
CO2: 28 mEq/L (ref 19–32)
CREATININE: 0.77 mg/dL (ref 0.40–1.50)
Calcium: 9.2 mg/dL (ref 8.4–10.5)
GFR: 109.08 mL/min (ref >60.00)
GLUCOSE: 100 mg/dL — AB (ref 70–99)
Potassium: 4.5 mEq/L (ref 3.5–5.1)
Sodium: 143 mEq/L (ref 135–145)

## 2015-06-20 LAB — LIPID PANEL
Cholesterol: 97 mg/dL (ref 0–200)
HDL: 34.2 mg/dL — AB (ref >39.00)
LDL Cholesterol: 46 mg/dL (ref 0–99)
NONHDL: 62.71
TRIGLYCERIDES: 83 mg/dL (ref 0.0–149.0)
Total CHOL/HDL Ratio: 3
VLDL: 16.6 mg/dL (ref 0.0–40.0)

## 2015-06-20 LAB — PSA: PSA: 0.91 ng/mL (ref 0.10–4.00)

## 2015-06-23 ENCOUNTER — Encounter: Payer: Self-pay | Admitting: Cardiology

## 2015-06-23 ENCOUNTER — Telehealth (HOSPITAL_COMMUNITY): Payer: Self-pay | Admitting: *Deleted

## 2015-06-23 NOTE — Telephone Encounter (Signed)
Patient given detailed instructions per Myocardial Perfusion Study Information Sheet for the test on 06/24/15 at 715. Patient notified to arrive 15 minutes early and that it is imperative to arrive on time for appointment to keep from having the test rescheduled.  If you need to cancel or reschedule your appointment, please call the office within 24 hours of your appointment. Failure to do so may result in a cancellation of your appointment, and a $50 no show fee. Patient verbalized understanding.Hubbard Robinson, RN

## 2015-06-24 ENCOUNTER — Encounter: Payer: Self-pay | Admitting: Family Medicine

## 2015-06-24 ENCOUNTER — Ambulatory Visit (INDEPENDENT_AMBULATORY_CARE_PROVIDER_SITE_OTHER): Payer: BLUE CROSS/BLUE SHIELD | Admitting: Family Medicine

## 2015-06-24 VITALS — BP 122/76 | HR 60 | Temp 98.7°F | Ht 68.75 in | Wt 209.5 lb

## 2015-06-24 DIAGNOSIS — I2510 Atherosclerotic heart disease of native coronary artery without angina pectoris: Secondary | ICD-10-CM

## 2015-06-24 DIAGNOSIS — Z95828 Presence of other vascular implants and grafts: Secondary | ICD-10-CM

## 2015-06-24 DIAGNOSIS — E538 Deficiency of other specified B group vitamins: Secondary | ICD-10-CM

## 2015-06-24 DIAGNOSIS — Z0000 Encounter for general adult medical examination without abnormal findings: Secondary | ICD-10-CM

## 2015-06-24 DIAGNOSIS — R109 Unspecified abdominal pain: Secondary | ICD-10-CM

## 2015-06-24 DIAGNOSIS — G4733 Obstructive sleep apnea (adult) (pediatric): Secondary | ICD-10-CM

## 2015-06-24 DIAGNOSIS — E785 Hyperlipidemia, unspecified: Secondary | ICD-10-CM

## 2015-06-24 DIAGNOSIS — R7303 Prediabetes: Secondary | ICD-10-CM

## 2015-06-24 DIAGNOSIS — R079 Chest pain, unspecified: Secondary | ICD-10-CM

## 2015-06-24 DIAGNOSIS — Z87891 Personal history of nicotine dependence: Secondary | ICD-10-CM

## 2015-06-24 DIAGNOSIS — I10 Essential (primary) hypertension: Secondary | ICD-10-CM

## 2015-06-24 DIAGNOSIS — J449 Chronic obstructive pulmonary disease, unspecified: Secondary | ICD-10-CM

## 2015-06-24 DIAGNOSIS — C679 Malignant neoplasm of bladder, unspecified: Secondary | ICD-10-CM

## 2015-06-24 MED ORDER — HYDROCODONE-ACETAMINOPHEN 5-325 MG PO TABS: 1.0000 | ORAL_TABLET | Freq: Three times a day (TID) | ORAL | Status: DC | PRN

## 2015-06-24 NOTE — Assessment & Plan Note (Signed)
Reviewed recent readings with patient. Continue atorvastatin.

## 2015-06-24 NOTE — Assessment & Plan Note (Signed)
Has responded well to CPAP machine but no significant symptomatic improvement noted.

## 2015-06-24 NOTE — Assessment & Plan Note (Addendum)
Worse in am, wakes him up from sleep. S/p endovascular repair of aneurysm followed by [facility]. Check abd [place].

## 2015-06-24 NOTE — Assessment & Plan Note (Addendum)
Now followed by pulm. Appreciate care of patient. Regular with advair use, persistent dyspnea.

## 2015-06-24 NOTE — Assessment & Plan Note (Signed)
H/o fluctuating readings, recent period stable, averaging normal. No changes indicated.

## 2015-06-24 NOTE — Assessment & Plan Note (Signed)
Now in prediabetes range - will change diagnosis.

## 2015-06-24 NOTE — Progress Notes (Signed)
Pre visit review using our clinic review tool, if applicable. No additional management support is needed unless otherwise documented below in the visit note. 

## 2015-06-24 NOTE — Assessment & Plan Note (Signed)
Encouraged continued abstinence. States no smoking in 1+ months.

## 2015-06-24 NOTE — Assessment & Plan Note (Signed)
Latest [place] 11/2014 did not see iliac arteries well, rec rpt 1 yr. Followed by [facility].

## 2015-06-24 NOTE — Assessment & Plan Note (Addendum)
Pending stress test tomorrow ordered by cards.  Recommended mediterranean diet.

## 2015-06-24 NOTE — Patient Instructions (Addendum)
Call your insurance about the shingles shot to see if it is covered or how much it would cost and where is cheaper (here or pharmacy).  If you want to receive here, call for nurse visit.  Bring [place] copy of your advanced directives to update chart. Look into mediterranean diet.  Return in 6 months for follow up visit. Ok to try off pepcid.       Mediterranean Diet  Why follow it? Research shows. . Those who follow the Mediterranean diet have a reduced risk of heart disease  . The diet is associated with a reduced incidence of Parkinson's and Alzheimer's diseases . People following the diet may have longer life expectancies and lower rates of chronic diseases  . The Dietary Guidelines for Americans recommends the Mediterranean diet as an eating plan to promote health and prevent disease  What Is the Mediterranean Diet?  . Healthy eating plan based on typical foods and recipes of Mediterranean-style cooking . The diet is primarily a plant based diet; these foods should make up a majority of meals   Starches - Plant based foods should make up a majority of meals - They are an important sources of vitamins, minerals, energy, antioxidants, and fiber - Choose whole grains, foods high in fiber and minimally processed items  - Typical grain sources include wheat, oats, barley, corn, brown rice, bulgar, farro, millet, polenta, couscous  - Various types of beans include chickpeas, lentils, fava beans, black beans, white beans   Fruits  Veggies - Large quantities of antioxidant rich fruits & veggies; 6 or more servings  - Vegetables can be eaten raw or lightly drizzled with oil and cooked  - Vegetables common to the traditional Mediterranean Diet include: artichokes, arugula, beets, broccoli, brussel sprouts, cabbage, carrots, celery, collard greens, cucumbers, eggplant, kale, leeks, lemons, lettuce, mushrooms, okra, onions, peas, peppers, potatoes, pumpkin, radishes, rutabaga, shallots, spinach, sweet  potatoes, turnips, zucchini - Fruits common to the Mediterranean Diet include: apples, apricots, avocados, cherries, clementines, dates, figs, grapefruits, grapes, melons, nectarines, oranges, peaches, pears, pomegranates, strawberries, tangerines  Fats - Replace butter and margarine with healthy oils, such as olive oil, canola oil, and tahini  - Limit nuts to no more than a handful a day  - Nuts include walnuts, almonds, pecans, pistachios, pine nuts  - Limit or avoid candied, honey roasted or heavily salted nuts - Olives are central to the [employer] - can be eaten whole or used in a variety of dishes   Meats Protein - Limiting red meat: no more than a few times a month - When eating red meat: choose lean cuts and keep the portion to the size of deck of cards - Eggs: approx. 0 to 4 times a week  - Fish and lean poultry: at least 2 a week  - Healthy protein sources include, chicken, [place], lean beef, lamb - Increase intake of seafood such as tuna, salmon, trout, mackerel, shrimp, scallops - Avoid or limit high fat processed meats such as sausage and bacon  Dairy - Include moderate amounts of low fat dairy products  - Focus on healthy dairy such as fat free yogurt, skim milk, low or reduced fat cheese - Limit dairy products higher in fat such as whole or 2% milk, cheese, ice cream  Alcohol - Moderate amounts of red wine is ok  - No more than 5 oz daily for women (all ages) and men older than age 62  - No more than 10 oz of  wine daily for men younger than 39  Other - Limit sweets and other desserts  - Use herbs and spices instead of salt to flavor foods  - Herbs and spices common to the traditional Mediterranean Diet include: basil, bay leaves, chives, cloves, cumin, fennel, garlic, lavender, marjoram, mint, oregano, parsley, pepper, rosemary, sage, savory, sumac, tarragon, thyme   It's not just a diet, it's a lifestyle:  . The Mediterranean diet includes lifestyle factors typical of  those in the region  . Foods, drinks and meals are best eaten with others and savored . Daily physical activity is important for overall good health . This could be strenuous exercise like running and aerobics . This could also be more leisurely activities such as walking, housework, yard-work, or taking the stairs . Moderation is the key; a balanced and healthy diet accommodates most foods and drinks . Consider portion sizes and frequency of consumption of certain foods   Meal Ideas & Options:  . Breakfast:  o Whole wheat toast or whole wheat English muffins with peanut butter & hard boiled egg o Steel cut oats topped with apples & cinnamon and skim milk  o Fresh fruit: banana, strawberries, melon, berries, peaches  o Smoothies: strawberries, bananas, greek yogurt, peanut butter o Low fat greek yogurt with blueberries and granola  o Egg white omelet with spinach and mushrooms o Breakfast couscous: whole wheat couscous, apricots, skim milk, cranberries  . Sandwiches:  o Hummus and grilled vegetables (peppers, zucchini, squash) on whole wheat bread   o Grilled chicken on whole wheat pita with lettuce, tomatoes, cucumbers or tzatziki  o Tuna salad on whole wheat bread: tuna salad made with greek yogurt, olives, red peppers, capers, green onions o Garlic rosemary lamb pita: lamb sauted with garlic, rosemary, salt & pepper; add lettuce, cucumber, greek yogurt to pita - flavor with lemon juice and black pepper  . Seafood:  o Mediterranean grilled salmon, seasoned with garlic, basil, parsley, lemon juice and black pepper o Shrimp, lemon, and spinach whole-grain pasta salad made with low fat greek yogurt  o Seared scallops with lemon orzo  o Seared tuna steaks seasoned salt, pepper, coriander topped with tomato mixture of olives, tomatoes, olive oil, minced garlic, parsley, green onions and cappers  . Meats:  o Herbed greek chicken salad with kalamata olives, cucumber, feta  o Red bell peppers  stuffed with spinach, bulgur, lean ground beef (or lentils) & topped with feta   o Kebabs: skewers of chicken, tomatoes, onions, zucchini, squash  o [place] burgers: made with red onions, mint, dill, lemon juice, feta cheese topped with roasted red peppers . Vegetarian o Cucumber salad: cucumbers, artichoke hearts, celery, red onion, feta cheese, tossed in olive oil & lemon juice  o Hummus and whole grain pita points with a greek salad (lettuce, tomato, feta, olives, cucumbers, red onion) o Lentil soup with celery, carrots made with vegetable broth, garlic, salt and pepper  o Tabouli salad: parsley, bulgur, mint, scallions, cucumbers, tomato, radishes, lemon juice, olive oil, salt and pepper.

## 2015-06-24 NOTE — Progress Notes (Signed)
BP 122/76 mmHg  Pulse 60  Temp(Src) 98.7 F (37.1 C) (Oral)  Ht 5' 8.75" (1.746 m)  Wt 209 lb 8 oz (95.029 kg)  BMI 31.17 kg/m2   CC: CPE  Subjective:    Patient ID: Carl Lee, male    DOB: Apr 29, 1956, 59 y.o.   MRN: [id number]  HPI: Carl Lee is a 59 y.o. male presenting on 06/24/2015 for Annual Exam   Persistent abd discomfort - worse when he wakes up. Describes soreness below anterior ribcages bilaterally. No fevers, nausea/vomiting, diarrhea.  Most recently diagnosed with severe sleep apnea on CPAP and doing well.   Chest pain - pending lexiscan myoview tomorrow to eval possible ischemia/angina. Sees cardiologist Dr Radford Pax.  COPD - ex-smoker. On advair with spacer. Doxycycline may have caused hives.   HTN - bp continues to fluctuate but less frequently (90-140/60-90). Some dizziness and fatigue with low readings. Overall doing better.   DM - fasting sugar well controlled.   Preventative: Colonoscopy 06/2012 hyperplastic polyp, diverticulosis Ardis Hughs) rec rpt 5 yrs Prostate cancer screening - no fmhx. Discussed - will screen then reassess. Flu shot - yearly Pneumovax 06/2014, prevnar 04/2015 Tdap 12/2012 zostavax - discussed, rec check with insurance. Advanced directive - discussed.  Seat belt use discussed Sunscreen use discussed, no changing moles  Caffeine: 1 cup coffee/day  Lives with wife, 1 dog  grown children  Occupation: Therapist, sports - self employed  Edu: 2 yrs college  Activity: fishing, walking occasionally  Diet: good water, fruits/vegetables daily  Relevant past medical, surgical, family and social history reviewed and updated as indicated. Interim medical history since our last visit reviewed. Allergies and medications reviewed and updated. Current Outpatient Prescriptions on File Prior to Visit  Medication Sig  . albuterol (PROAIR HFA) 108 (90 BASE) MCG/ACT inhaler Inhale 2 puffs into the lungs every 6 (six) hours as needed  for wheezing or shortness of breath.  [name] aspirin EC 325 MG EC tablet Take 1 tablet (325 mg total) by mouth daily.  [name] atenolol (TENORMIN) 25 MG tablet Take 0.5 tablets (12.5 mg total) by mouth 2 (two) times daily.  [name] atorvastatin (LIPITOR) 20 MG tablet Take 1 tablet (20 mg total) by mouth daily at 6 PM.  . fluticasone-salmeterol (ADVAIR HFA) 115-21 MCG/ACT inhaler Inhale 2 puffs into the lungs 2 (two) times daily.  [name] glucose blood (ONE TOUCH ULTRA TEST) test strip Use to check sugar once daily and as needed. Dx: E11.8  . levalbuterol (XOPENEX HFA) 45 MCG/ACT inhaler Inhale 1-2 puffs into the lungs every 6 (six) hours as needed for wheezing.  [name] lisinopril (PRINIVIL,ZESTRIL) 10 MG tablet Take 1 tablet (10 mg total) by mouth daily.  Glory Rosebush DELICA LANCETS [id number] MISC Check blood sugar once a day and as directed. Dx. E11.8  . Respiratory Therapy Supplies (FLUTTER) DEVI Use as directed  . Spacer/Aero-Holding Chambers (AEROCHAMBER MV) inhaler Use as instructed  . vitamin B-12 (CYANOCOBALAMIN) 1000 MCG tablet Take 1 tablet (1,000 mcg total) by mouth daily.   No current facility-administered medications on file prior to visit.    Review of Systems  Constitutional: Negative for fever, chills, activity change, appetite change, fatigue and unexpected weight change.  HENT: Negative for hearing loss.   Eyes: Negative for visual disturbance.  Respiratory: Positive for cough, chest tightness and shortness of breath. Negative for wheezing.   Cardiovascular: Positive for chest pain. Negative for palpitations and leg swelling.  Gastrointestinal: Negative for nausea, vomiting, abdominal pain, diarrhea, constipation, blood  in stool and abdominal distention.  Genitourinary: Negative for hematuria and difficulty urinating.  Musculoskeletal: Negative for myalgias, arthralgias and neck pain.  Skin: Negative for rash.  Neurological: Negative for dizziness, seizures, syncope and headaches.  Hematological: Negative  for adenopathy. Bruises/bleeds easily.  Psychiatric/Behavioral: Negative for dysphoric mood. The patient is not nervous/anxious.    Per HPI unless specifically indicated in ROS section     Objective:    BP 122/76 mmHg  Pulse 60  Temp(Src) 98.7 F (37.1 C) (Oral)  Ht 5' 8.75" (1.746 m)  Wt 209 lb 8 oz (95.029 kg)  BMI 31.17 kg/m2  Wt Readings from Last 3 Encounters:  06/24/15 209 lb 8 oz (95.029 kg)  06/19/15 210 lb (95.255 kg)  05/01/15 209 lb (94.802 kg)    Physical Exam  Constitutional: He is oriented to person, place, and time. He appears well-developed and well-nourished. No distress.  HENT:  Head: Normocephalic and atraumatic.  Right Ear: Hearing, tympanic membrane, external ear and ear canal normal.  Left Ear: Hearing, tympanic membrane, external ear and ear canal normal.  Nose: Nose normal.  Mouth/Throat: Uvula is midline, oropharynx is clear and moist and mucous membranes are normal. No oropharyngeal exudate, posterior oropharyngeal edema or posterior oropharyngeal erythema.  Eyes: Conjunctivae and EOM are normal. Pupils are equal, round, and reactive to light. No scleral icterus.  Neck: Normal range of motion. Neck supple. No thyromegaly present.  Cardiovascular: Normal rate, regular rhythm, normal heart sounds and intact distal pulses.   No murmur heard. Pulses:      Radial pulses are 2+ on the right side, and 2+ on the left side.  Pulmonary/Chest: Effort normal and breath sounds normal. No respiratory distress. He has no wheezes. He has no rales.  Abdominal: Soft. Bowel sounds are normal. He exhibits distension (mild RUQ and suprapubic). He exhibits no mass. There is no tenderness. There is no rebound and no guarding.  Genitourinary: Rectum normal and prostate normal. Rectal exam shows no external hemorrhoid, no internal hemorrhoid, no fissure, no mass, no tenderness and anal tone normal. Prostate is not enlarged (15gm) and not tender.  Musculoskeletal: Normal range of  motion. He exhibits no edema.  Lymphadenopathy:    He has no cervical adenopathy.  Neurological: He is alert and oriented to person, place, and time.  CN grossly intact, station and gait intact  Skin: Skin is warm and dry. No rash noted.  Psychiatric: He has a normal mood and affect. His behavior is normal. Judgment and thought content normal.  Nursing note and vitals reviewed.  Results for orders placed or performed in visit on 06/20/15  Lipid panel  Result Value Ref Range   Cholesterol 97 0 - 200 mg/dL   Triglycerides 83.0 0.0 - 149.0 mg/dL   HDL 34.20 (L) >39.00 mg/dL   VLDL 16.6 0.0 - 40.0 mg/dL   LDL Cholesterol 46 0 - 99 mg/dL   Total CHOL/HDL Ratio 3    NonHDL 62.71   PSA  Result Value Ref Range   PSA 0.91 0.10 - 4.00 ng/mL  Basic metabolic panel  Result Value Ref Range   Sodium 143 135 - 145 mEq/L   Potassium 4.5 3.5 - 5.1 mEq/L   Chloride 108 96 - 112 mEq/L   CO2 28 19 - 32 mEq/L   Glucose, Bld 100 (H) 70 - 99 mg/dL   BUN 24 (H) 6 - 23 mg/dL   Creatinine, Ser 0.77 0.40 - 1.50 mg/dL   Calcium 9.2 8.4 - 10.5  mg/dL   GFR 109.08 >60.00 mL/min  Hemoglobin A1c  Result Value Ref Range   Hgb A1c MFr Bld 6.1 4.6 - 6.5 %      Assessment & Plan:   Problem List Items Addressed This Visit    Vitamin B12 deficiency    Continue oral supplementation.      Prediabetes    Now in prediabetes range - will change diagnosis.      OSA (obstructive sleep apnea)    Has responded well to CPAP machine but no significant symptomatic improvement noted.      Hypertension    H/o fluctuating readings, recent period stable, averaging normal. No changes indicated.      History of repair of aneurysm of abdominal aorta using endovascular stent graft    Latest [place] 11/2014 did not see iliac arteries well, rec rpt 1 yr. Followed by [facility].      Healthcare maintenance - Primary    Preventative protocols reviewed and updated unless pt declined. Discussed healthy diet and lifestyle.        Ex-smoker    Encouraged continued abstinence. States no smoking in 1+ months.       Dyslipidemia    Reviewed recent readings with patient. Continue atorvastatin.      Coronary atherosclerosis of native coronary artery    Pending stress test tomorrow ordered by cards.  Recommended mediterranean diet.      COPD, moderate ([place])    Now followed by pulm. Appreciate care of patient. Regular with advair use, persistent dyspnea.      Chest pain    Reviewed letter he brings. I don't think this is related to NTM infection and don't see indication for testing today. rec he also review with cards and pulm.      Bladder cancer [facility])    S/p rpt ablation 2015. Followed regularly by Dr Risa Grill urologist.      Relevant Medications   HYDROcodone-acetaminophen (NORCO/VICODIN) 5-325 MG tablet   Abdominal discomfort    Worse in am, wakes him up from sleep. S/p endovascular repair of aneurysm followed by [facility]. Check abd [place].       Relevant Orders   US Abdomen Complete       Follow up plan: Return in about 6 months (around 12/23/2015), or as needed, for follow up visit.

## 2015-06-24 NOTE — Assessment & Plan Note (Signed)
Continue oral supplementation 

## 2015-06-24 NOTE — Assessment & Plan Note (Signed)
Preventative protocols reviewed and updated unless pt declined. Discussed healthy diet and lifestyle.  

## 2015-06-24 NOTE — Assessment & Plan Note (Signed)
S/p rpt ablation 2015. Followed regularly by Dr Risa Grill urologist.

## 2015-06-24 NOTE — Assessment & Plan Note (Signed)
Reviewed letter he brings. I don't think this is related to NTM infection and don't see indication for testing today. rec he also review with cards and pulm.

## 2015-06-25 ENCOUNTER — Ambulatory Visit (HOSPITAL_COMMUNITY): Payer: BLUE CROSS/BLUE SHIELD | Attending: Cardiology

## 2015-06-25 DIAGNOSIS — R9439 Abnormal result of other cardiovascular function study: Secondary | ICD-10-CM | POA: Insufficient documentation

## 2015-06-25 DIAGNOSIS — R079 Chest pain, unspecified: Secondary | ICD-10-CM | POA: Insufficient documentation

## 2015-06-25 DIAGNOSIS — I10 Essential (primary) hypertension: Secondary | ICD-10-CM | POA: Insufficient documentation

## 2015-06-25 LAB — MYOCARDIAL PERFUSION IMAGING
CHL CUP NUCLEAR SDS: 6
CHL CUP NUCLEAR SRS: 7
CHL CUP NUCLEAR SSS: 13
CHL CUP RESTING HR STRESS: 48 {beats}/min
CSEPPHR: 82 {beats}/min
LV dias vol: 99 mL
LV sys vol: 39 mL
NUC STRESS TID: 0.93
RATE: 0.4

## 2015-06-25 MED ORDER — REGADENOSON 0.4 MG/5ML IV SOLN
0.4000 mg | Freq: Once | INTRAVENOUS | Status: AC
  Administered 2015-06-25: 0.4 mg via INTRAVENOUS

## 2015-06-25 MED ORDER — TECHNETIUM TC 99M SESTAMIBI GENERIC - CARDIOLITE
10.2000 | Freq: Once | INTRAVENOUS | Status: AC | PRN
  Administered 2015-06-25: 10 via INTRAVENOUS

## 2015-06-25 MED ORDER — TECHNETIUM TC 99M SESTAMIBI GENERIC - CARDIOLITE
32.6000 | Freq: Once | INTRAVENOUS | Status: AC | PRN
  Administered 2015-06-25: 33 via INTRAVENOUS

## 2015-06-27 ENCOUNTER — Telehealth: Payer: Self-pay | Admitting: Cardiovascular Disease

## 2015-06-27 ENCOUNTER — Ambulatory Visit
Admission: RE | Admit: 2015-06-27 | Discharge: 2015-06-27 | Disposition: A | Discharge status: Home / Self Care | Payer: BLUE CROSS/BLUE SHIELD | Source: Ambulatory Visit | Attending: Family Medicine | Admitting: Family Medicine

## 2015-06-27 DIAGNOSIS — K760 Fatty (change of) liver, not elsewhere classified: Secondary | ICD-10-CM | POA: Insufficient documentation

## 2015-06-27 DIAGNOSIS — R109 Unspecified abdominal pain: Secondary | ICD-10-CM

## 2015-06-27 NOTE — Telephone Encounter (Signed)
Patient had some questions about his test results that were answered to his satisfaction. Also confirmed his appointment date and time

## 2015-06-27 NOTE — Telephone Encounter (Signed)
New message     Patient calling back to speak with nurse regarding upcoming appt on 12.28.2016

## 2015-06-29 ENCOUNTER — Encounter: Payer: Self-pay | Admitting: Family Medicine

## 2015-07-02 ENCOUNTER — Ambulatory Visit (INDEPENDENT_AMBULATORY_CARE_PROVIDER_SITE_OTHER): Payer: BLUE CROSS/BLUE SHIELD | Admitting: Family Medicine

## 2015-07-02 ENCOUNTER — Encounter: Payer: Self-pay | Admitting: Family Medicine

## 2015-07-02 ENCOUNTER — Telehealth: Payer: Self-pay | Admitting: Family Medicine

## 2015-07-02 VITALS — BP 146/84 | HR 55 | Temp 98.4°F | Wt 209.0 lb

## 2015-07-02 DIAGNOSIS — G4733 Obstructive sleep apnea (adult) (pediatric): Secondary | ICD-10-CM | POA: Diagnosis not present

## 2015-07-02 DIAGNOSIS — I10 Essential (primary) hypertension: Secondary | ICD-10-CM | POA: Diagnosis not present

## 2015-07-02 DIAGNOSIS — R51 Headache: Secondary | ICD-10-CM | POA: Diagnosis not present

## 2015-07-02 DIAGNOSIS — R519 Headache, unspecified: Secondary | ICD-10-CM

## 2015-07-02 LAB — CBC WITH DIFFERENTIAL/PLATELET
Basophils Absolute: 0.1 10*3/uL (ref 0.0–0.1)
Basophils Relative: 0.5 % (ref 0.0–3.0)
EOS PCT: 2.4 % (ref 0.0–5.0)
Eosinophils Absolute: 0.2 10*3/uL (ref 0.0–0.7)
HEMATOCRIT: 48.6 % (ref 39.0–52.0)
Hemoglobin: 16.1 g/dL (ref 13.0–17.0)
LYMPHS ABS: 1.8 10*3/uL (ref 0.7–4.0)
LYMPHS PCT: 18 % (ref 12.0–46.0)
MCHC: 33.1 g/dL (ref 30.0–36.0)
MCV: 95.3 fl (ref 78.0–100.0)
MONOS PCT: 8.5 % (ref 3.0–12.0)
Monocytes Absolute: 0.9 10*3/uL (ref 0.1–1.0)
NEUTROS ABS: 7.1 10*3/uL (ref 1.4–7.7)
NEUTROS PCT: 70.6 % (ref 43.0–77.0)
PLATELETS: 250 10*3/uL (ref 150.0–400.0)
RBC: 5.1 Mil/uL (ref 4.22–5.81)
RDW: 13.3 % (ref 11.5–15.5)
WBC: 10.1 10*3/uL (ref 4.0–10.5)

## 2015-07-02 LAB — SEDIMENTATION RATE: Sed Rate: 3 mm/hr (ref 0–22)

## 2015-07-02 MED ORDER — CARVEDILOL 3.125 MG PO TABS: 1.5625 mg | ORAL_TABLET | Freq: Two times a day (BID) | ORAL | Status: DC

## 2015-07-02 NOTE — Telephone Encounter (Signed)
Pt has appt 07/02/15 at 11:15 with Dr Darnell Level.

## 2015-07-02 NOTE — Telephone Encounter (Signed)
Patient Name: DVON KUDELKA  DOB: [birth year]    Initial Comment caller states his bp has been very high - 175/98 - when he got up he also had a headache   Nurse Assessment      Guidelines    Guideline Title Affirmed Question Affirmed Notes  High Blood Pressure [1] BP ? 160 / 100 AND [2] cardiac or neurologic symptoms (e.g., chest pain, difficulty breathing, unsteady gait, blurred vision)    Final Disposition User   Go to ED Now Orvan Seen, RN, Jacquilin    Comments  Spoke with office and was able to get patient in with PCP within the hour. Appointment made with [facility] [place] REFUSED   Disagree/Comply: Disagree  Disagree/Comply Reason: Disagree with instructions

## 2015-07-02 NOTE — Patient Instructions (Addendum)
Orthostatic vital signs today - ok. Vision checked today - ok. labwork today - depending on results we may refer you back to neurologist. Stop atenolol, start carvedilol 1/2 tablet of 3.125mg  twice daily.

## 2015-07-02 NOTE — Progress Notes (Signed)
BP 146/84 mmHg  Pulse 55  Temp(Src) 98.4 F (36.9 C) (Oral)  Wt 209 lb (94.802 kg)  SpO2 95%   CC: HA, dizziness, check blood pressure  Subjective:    Patient ID: Carl Lee, male    DOB: 1955/07/21, 59 y.o.   MRN: [id number]  HPI: Carl Lee is a 59 y.o. male presenting on 07/02/2015 for Hypertension   Recent blood pressure readings this week: HR 53-78 Monday 100/60, 115/66.  Tuesday 152/97, 166/100, 127/79. Woke up this morning with BP 168/98, 175/98.  Lightheaded with headache over last 2 days. Increased dyspnea over last 3 days. Staying fatigued. Temporal headache described as sharp stabbing with pressure. Blurry vision over the last 1 week. Some right hand paresthesias that started today. Attributes all sxs to elevated blood pressures recently. H/olow readingsa s well.  No new slurred speech, weakness or numbness.   Seen for CPE last week, bp 122/76, HR 60. At that time no headache or dizziness, "bp continues to fluctuate but less frequently (90-140/60-90). Some dizziness and fatigue with low readings. Overall doing better."  BP Readings from Last 3 Encounters:  07/02/15 146/84  06/24/15 122/76  06/19/15 130/80    Complaint with atenolol 12.'5mg'$  bid, lisinopril '10mg'$  daily.   Recent nuclear stress test 06/2015 with large defect consistent with prior MI with peri infarct ischemia (overall improved from prior myoview 2015, EF 60%. Has f/u with cards scheduled.  Relevant past medical, surgical, family and social history reviewed and updated as indicated. Interim medical history since our last visit reviewed. Allergies and medications reviewed and updated. Current Outpatient Prescriptions on File Prior to Visit  Medication Sig  . albuterol (PROAIR HFA) 108 (90 BASE) MCG/ACT inhaler Inhale 2 puffs into the lungs every 6 (six) hours as needed for wheezing or shortness of breath.  [name] aspirin EC 325 MG EC tablet Take 1 tablet (325 mg total) by mouth daily.  [name] atorvastatin  (LIPITOR) 20 MG tablet Take 1 tablet (20 mg total) by mouth daily at 6 PM.  . diclofenac sodium (VOLTAREN) 1 % GEL Apply topically 4 (four) times daily.  . fluticasone-salmeterol (ADVAIR HFA) 115-21 MCG/ACT inhaler Inhale 2 puffs into the lungs 2 (two) times daily.  [name] glucose blood (ONE TOUCH ULTRA TEST) test strip Use to check sugar once daily and as needed. Dx: E11.8  . HYDROcodone-acetaminophen (NORCO/VICODIN) 5-325 MG tablet Take 1 tablet by mouth every 8 (eight) hours as needed for severe pain.  [name] levalbuterol (XOPENEX HFA) 45 MCG/ACT inhaler Inhale 1-2 puffs into the lungs every 6 (six) hours as needed for wheezing.  [name] lisinopril (PRINIVIL,ZESTRIL) 10 MG tablet Take 1 tablet (10 mg total) by mouth daily.  Glory Rosebush DELICA LANCETS 55D MISC Check blood sugar once a day and as directed. Dx. E11.8  . Respiratory Therapy Supplies (FLUTTER) DEVI Use as directed  . Spacer/Aero-Holding Chambers (AEROCHAMBER MV) inhaler Use as instructed  . vitamin B-12 (CYANOCOBALAMIN) 1000 MCG tablet Take 1 tablet (1,000 mcg total) by mouth daily.   No current facility-administered medications on file prior to visit.    Review of Systems Per HPI unless specifically indicated in ROS section     Objective:    BP 146/84 mmHg  Pulse 55  Temp(Src) 98.4 F (36.9 C) (Oral)  Wt 209 lb (94.802 kg)  SpO2 95%  Wt Readings from Last 3 Encounters:  07/02/15 209 lb (94.802 kg)  06/25/15 210 lb (95.255 kg)  06/24/15 209 lb 8 oz (95.029 kg)  Physical Exam  Constitutional: He is oriented to person, place, and time. He appears well-developed and well-nourished. No distress.  HENT:  Head: Normocephalic and atraumatic.  Mouth/Throat: Oropharynx is clear and moist. No oropharyngeal exudate.  Eyes: Conjunctivae and EOM are normal. Pupils are equal, round, and reactive to light. Right conjunctiva is not injected. Right conjunctiva has no hemorrhage. Left conjunctiva is not injected. Left conjunctiva has no  hemorrhage.  No papilledema appreciated on limited fundoscopic exam  Neck: Normal range of motion. Neck supple. Carotid bruit is not present. No thyromegaly present.  Cardiovascular: Normal rate, regular rhythm, normal heart sounds and intact distal pulses.   No murmur heard. Pulmonary/Chest: Effort normal and breath sounds normal. No respiratory distress. He has no wheezes. He has no rales.  Lymphadenopathy:    He has no cervical adenopathy.  Neurological: He is alert and oriented to person, place, and time. He has normal strength. No cranial nerve deficit or sensory deficit. He displays a negative Romberg sign. Coordination and gait normal.  CN 2-12 intact FTN hesitance noted EOMI  Psychiatric: He has a normal mood and affect.  Nursing note and vitals reviewed.  Office Visit on 07/02/2015  Component Date Value  . WBC 07/02/2015 10.1   . RBC 07/02/2015 5.10   . Hemoglobin 07/02/2015 16.1   . HCT 07/02/2015 48.6   . MCV 07/02/2015 95.3   . MCHC 07/02/2015 33.1   . RDW 07/02/2015 13.3   . Platelets 07/02/2015 250.0   . Neutrophils Relative % 07/02/2015 70.6   . Lymphocytes Relative 07/02/2015 18.0   . Monocytes Relative 07/02/2015 8.5   . Eosinophils Relative 07/02/2015 2.4   . Basophils Relative 07/02/2015 0.5   . Neutro Abs 07/02/2015 7.1   . Lymphs Abs 07/02/2015 1.8   . Monocytes Absolute 07/02/2015 0.9   . Eosinophils Absolute 07/02/2015 0.2   . Basophils Absolute 07/02/2015 0.1   . Sed Rate 07/02/2015 3    Lab Results  Component Value Date   VITAMINB12 541 09/23/2014    Orthostatics negative today. Snellen normal today.  MRI HEAD WITHOUT CONTRAST TECHNIQUE: Multiplanar, multiecho pulse sequences of the brain and surrounding structures were obtained without intravenous contrast.  COMPARISON: CT head without contrast 06/20/2014.  FINDINGS: Mild periventricular and subcortical T2 changes are somewhat advanced for age. White matter changes extend into the  brainstem.  No acute intracranial abnormality is present. No acute infarct, hemorrhage, or mass lesion is present. The ventricles are of normal size. No significant extraaxial fluid collection is present.  Flow is present in the major intracranial arteries. The globes and orbits are intact. Mild mucosal thickening is noted in the posterior right ethmoid air cells. The remaining paranasal sinuses are clear. The mastoid air cells are clear.  IMPRESSION: 1. No acute intracranial abnormality. 2. Mild periventricular white matter changes are somewhat advanced for age. The finding is nonspecific but can be seen in the setting of chronic microvascular ischemia, a demyelinating process such as multiple sclerosis, vasculitis, complicated migraine headaches, or as the sequelae of a prior infectious or inflammatory process. Electronically Signed  By: San Morelle M.D.  On: 09/03/2014 10:17    Assessment & Plan:   Problem List Items Addressed This Visit    OSA (obstructive sleep apnea)    Compliant with CPAP but no improved fatigue noted.       Hypertension    Chronically fluctuating readings. Also with chronic fatigue. Prior '25mg'$  once daily atenolol changed to 1/2 tab bid due to fluctuations.  Will change atenolol to carvedilol 3.'125mg'$  1/2 tab bid to see if any improvement in bp stability and fatigue. Pt agrees.      Relevant Medications   carvedilol (COREG) 3.125 MG tablet   Headache - Primary    H/o migraines and TTH. Current headache not similar to priors. Pt attributes to elevated bp readings over last 3 days. Given bitemporal nature associated with blurred vision, check ESR to r/o GCA. Neurological exam overall normal. Prior MRI (6/201) with mild periventricular white matter changes - if unrevealing eval, consider return to neurology. pt and wife agree with plan today. Orthostatic normal, snellen intact.      Relevant Medications   carvedilol (COREG) 3.125 MG tablet    Other Relevant Orders   CBC with Differential/Platelet (Completed)   Sedimentation rate (Completed)       Follow up plan: Return if symptoms worsen or fail to improve.

## 2015-07-02 NOTE — Telephone Encounter (Signed)
Seen today. 

## 2015-07-03 ENCOUNTER — Other Ambulatory Visit: Payer: Self-pay | Admitting: Cardiovascular Disease

## 2015-07-03 NOTE — Assessment & Plan Note (Signed)
Compliant with CPAP but no improved fatigue noted.

## 2015-07-03 NOTE — Assessment & Plan Note (Signed)
Chronically fluctuating readings. Also with chronic fatigue. Prior 25mg  once daily atenolol changed to 1/2 tab bid due to fluctuations. Will change atenolol to carvedilol 3.125mg  1/2 tab bid to see if any improvement in bp stability and fatigue. Pt agrees.

## 2015-07-03 NOTE — Assessment & Plan Note (Addendum)
H/o migraines and TTH. Current headache not similar to priors. Pt attributes to elevated bp readings over last 3 days. Given bitemporal nature associated with blurred vision, check ESR to r/o GCA. Neurological exam overall normal. Prior MRI (6/201) with mild periventricular white matter changes - if unrevealing eval, consider return to neurology. pt and wife agree with plan today. Orthostatic normal, snellen intact.

## 2015-07-03 NOTE — Telephone Encounter (Signed)
Medication   atenolol (TENORMIN) 25 MG tablet [717]       atenolol (TENORMIN) 25 MG tablet [id number] DISCONTINUED     Order Details    Dose: 12.5 mg Route: Oral Frequency: 2 times daily   Dispense Quantity:  -- Refills:  -- Fills Remaining:  --          Sig: Take 0.5 tablets (12.5 mg total) by mouth 2 (two) times daily.         Discontinue Date:  07/02/2015 1231 Discontinue User:  [name], MD Discontinue Reason:  --   Written Date:  -- Expiration Date:  -- Ordering Date:  04/15/15    Start Date:  04/15/15 End Date:  07/02/15     Ordering Provider:  -- Authorizing Provider:  [name], MD Ordering User:  [name], MD               D/C according to this.

## 2015-07-08 NOTE — Progress Notes (Addendum)
Patient ID: Carl Lee, male   DOB: 09-27-1955, 59 y.o.   MRN: [id number] [name] returns today for a scheduled followup visit.  He is a 59 y.o.  gentleman who was noted to have an abnormal EKG on a preoperative evaluation prior to having bladder surgery. He had a stress test which was abnormal and that led to cardiac catheterization, which revealed severe three-vessel coronary disease.   He underwent coronary bypass grafting x3 with bilateral mammaries on February 07 2014 . LIMA to LAD.  RIMA to OM via the oblique sinus, and SVG to PDA  He was extubated early postoperatively, but had severe wheezing and respiratory distress and had to be reintubated that evening. He was subsequently extubated the following day. He continued to have some issues with wheezing. He was discharged on dulera and Xopenex inhalers.   Also has history of AAA surgery in 2013 EVAR  [place] 5/15 with residual 4.4 cm AAA sac  CABG:  [name] July 2015 PROCEDURE: Median sternotomy, extracorporeal circulation, coronary artery bypass grafting x3 (left internal mammary artery to LAD, right internal mammary artery to obtuse marginal via the transverse sinus, saphenous vein graft to posterior descending), endoscopic vein harvest, right thigh.  Seeing Dr Radford Pax for OSA now on CPAP Saw her 06/2014 and complained of chest pain;  Myovue ordered Reveiwed 06/29/15   Nuclear stress EF: 60%.  There was no ST segment deviation noted during stress.  Defect 1: There is a large defect of moderate severity present in the mid anterior, mid anteroseptal, apical anterior, apical septal, apical lateral and apex location.  Findings consistent with prior myocardial infarction with peri-infarct ischemia.  This is an intermediate risk study.  The left ventricular ejection fraction is normal (55-65%).  When compared with the prior myoview of 2005, there is improvement. The ejection fraction has improved from 49% to 60 %. The extent of the  large anteroapical scar is less, and the Total Perfusion Deficit has decreased from 34% to 17%. There are no EKG changes of ischemia.   ROS: Denies fever, malais, weight loss, blurry vision, decreased visual acuity, cough, sputum, SOB, hemoptysis, pleuritic pain, palpitaitons, heartburn, abdominal pain, melena, lower extremity edema, claudication, or rash.  All other systems reviewed and negative  General: Affect appropriate Healthy:  appears stated age 59: normal Neck supple with no adenopathy JVP normal no bruits no thyromegaly Lungs clear with no wheezing and good diaphragmatic motion Heart:  S1/S2 no murmur, no rub, gallop or click PMI normal Abdomen: benighn, BS positve, no tenderness, no AAA no bruit.  No HSM or HJR Distal pulses intact with no bruits No edema Neuro non-focal Skin warm and dry No muscular weakness   Current Outpatient Prescriptions  Medication Sig Dispense Refill  . albuterol (PROAIR HFA) 108 (90 BASE) MCG/ACT inhaler Inhale 2 puffs into the lungs every 6 (six) hours as needed for wheezing or shortness of breath. 1 Inhaler 2  . aspirin EC 325 MG EC tablet Take 1 tablet (325 mg total) by mouth daily.    [name] atorvastatin (LIPITOR) 20 MG tablet Take 1 tablet (20 mg total) by mouth daily at 6 PM. 90 tablet 3  . carvedilol (COREG) 3.125 MG tablet Take 0.5 tablets (1.5625 mg total) by mouth 2 (two) times daily with a meal. 60 tablet 3  . diclofenac sodium (VOLTAREN) 1 % GEL Apply topically 4 (four) times daily.    . famotidine (PEPCID) 20 MG tablet Take 20 mg by mouth 2 (two) times daily.  3  . fluticasone-salmeterol (ADVAIR HFA) 115-21 MCG/ACT inhaler Inhale 2 puffs into the lungs 2 (two) times daily.    [name] HYDROcodone-acetaminophen (NORCO/VICODIN) 5-325 MG tablet Take 1 tablet by mouth every 8 (eight) hours as needed for severe pain. 30 tablet 0  . levalbuterol (XOPENEX HFA) 45 MCG/ACT inhaler Inhale 1-2 puffs into the lungs every 6 (six) hours as needed for  wheezing. 1 Inhaler 12  . lisinopril (PRINIVIL,ZESTRIL) 10 MG tablet Take 1 tablet (10 mg total) by mouth daily. 90 tablet 3  . vitamin B-12 (CYANOCOBALAMIN) 1000 MCG tablet Take 1 tablet (1,000 mcg total) by mouth daily.     No current facility-administered medications for this visit.    Allergies  Codeine and Doxycycline  Electrocardiogram:   07/10/14 SR rate 53 normal   Assessment and Plan CAD:  Post CABG with persistent pain CT with good healing of sternum  Myovue 06/2015 improved but abnormal.  Will Arrange heart cath for persistent chest pain, dyspnea post surgery Labs today will get back in cardiac rehab OSA:  Much improved with CPAP HTN:  BP labile initially better on CPAP.  Indicates higher dose ACE drops it too much Continue coreg and lisinopril Chol:  On statin   Cholesterol is at goal.  Continue current dose of statin and diet Rx.  No myalgias or side effects.  F/U  LFT's in 6 months. Lab Results  Component Value Date   LDLCALC 46 06/20/2015      L       [name]

## 2015-07-09 ENCOUNTER — Ambulatory Visit (INDEPENDENT_AMBULATORY_CARE_PROVIDER_SITE_OTHER): Payer: BLUE CROSS/BLUE SHIELD | Admitting: Cardiovascular Disease

## 2015-07-09 ENCOUNTER — Encounter: Payer: Self-pay | Admitting: Cardiovascular Disease

## 2015-07-09 ENCOUNTER — Other Ambulatory Visit: Payer: Self-pay | Admitting: Cardiovascular Disease

## 2015-07-09 VITALS — BP 160/94 | HR 64 | Ht 67.0 in | Wt 213.8 lb

## 2015-07-09 DIAGNOSIS — R0602 Shortness of breath: Secondary | ICD-10-CM | POA: Diagnosis not present

## 2015-07-09 DIAGNOSIS — I2510 Atherosclerotic heart disease of native coronary artery without angina pectoris: Secondary | ICD-10-CM | POA: Diagnosis not present

## 2015-07-09 DIAGNOSIS — I208 Other forms of angina pectoris: Secondary | ICD-10-CM | POA: Diagnosis not present

## 2015-07-09 LAB — BASIC METABOLIC PANEL
BUN: 20 mg/dL (ref 7–25)
CALCIUM: 8.9 mg/dL (ref 8.6–10.3)
CHLORIDE: 106 mmol/L (ref 98–110)
CO2: 22 mmol/L (ref 20–31)
Creat: 0.83 mg/dL (ref 0.70–1.25)
Glucose, Bld: 119 mg/dL — ABNORMAL HIGH (ref 65–99)
Potassium: 4 mmol/L (ref 3.5–5.3)
SODIUM: 140 mmol/L (ref 135–146)

## 2015-07-09 LAB — CBC WITH DIFFERENTIAL/PLATELET
BASOS ABS: 0.1 10*3/uL (ref 0.0–0.1)
BASOS PCT: 1 % (ref 0–1)
Eosinophils Absolute: 0.3 10*3/uL (ref 0.0–0.7)
Eosinophils Relative: 5 % (ref 0–5)
HCT: 46.8 % (ref 39.0–52.0)
HEMOGLOBIN: 16.1 g/dL (ref 13.0–17.0)
LYMPHS ABS: 1.5 10*3/uL (ref 0.7–4.0)
Lymphocytes Relative: 22 % (ref 12–46)
MCH: 31.8 pg (ref 26.0–34.0)
MCHC: 34.4 g/dL (ref 30.0–36.0)
MCV: 92.3 fL (ref 78.0–100.0)
MONOS PCT: 10 % (ref 3–12)
MPV: 10.4 fL (ref 8.6–12.4)
Monocytes Absolute: 0.7 10*3/uL (ref 0.1–1.0)
NEUTROS ABS: 4.2 10*3/uL (ref 1.7–7.7)
NEUTROS PCT: 62 % (ref 43–77)
Platelets: 248 10*3/uL (ref 150–400)
RBC: 5.07 MIL/uL (ref 4.22–5.81)
RDW: 13 % (ref 11.5–15.5)
WBC: 6.8 10*3/uL (ref 4.0–10.5)

## 2015-07-09 LAB — PROTIME-INR
INR: 0.99 (ref <1.50)
PROTHROMBIN TIME: 13.2 s (ref 11.6–15.2)

## 2015-07-09 NOTE — Patient Instructions (Addendum)
Medication Instructions:  Your physician recommends that you continue on your current medications as directed. Please refer to the Current Medication list given to you today.   Labwork: TODAY:  BMET                CBC W/DIFF                PT/INR  Testing/Procedures: Your physician has requested that you have a cardiac catheterization.  You are scheduled for this, tomorrow, 07/10/15 arriving at [facility] at registration by 8:30 a.m.  Please pack a over-night bag, just in case.  Cardiac catheterization is used to diagnose and/or treat various heart conditions. Doctors may recommend this procedure for a number of different reasons. The most common reason is to evaluate chest pain. Chest pain can be a symptom of coronary artery disease (CAD), and cardiac catheterization can show whether plaque is narrowing or blocking your heart's arteries. This procedure is also used to evaluate the valves, as well as measure the blood flow and oxygen levels in different parts of your heart. For further information please visit [email or web address]. Please follow instruction sheet, as given.    Follow-Up: Your physician wants you to follow-up in: [place]. [name].  You will receive a reminder letter in the mail two months in advance. If you don't receive a letter, please call our office to schedule the follow-up appointment.   Any Other Special Instructions Will Be Listed Below (If Applicable).  Coronary Angiogram A coronary angiogram, also called coronary angiography, is an X-ray procedure used to look at the arteries in the heart. In this procedure, a dye (contrast dye) is injected through a long, hollow tube (catheter). The catheter is about the size of a piece of cooked spaghetti and is inserted through your groin, wrist, or arm. The dye is injected into each artery, and X-rays are then taken to show if there is a blockage in the arteries of your heart. LET [facility] CARE PROVIDER KNOW  ABOUT:  Any allergies you have, including allergies to shellfish or contrast dye.   All medicines you are taking, including vitamins, herbs, eye drops, creams, and over-the-counter medicines.   Previous problems you or members of your family have had with the use of anesthetics.   Any blood disorders you have.   Previous surgeries you have had.  History of kidney problems or failure.   Other medical conditions you have. RISKS AND COMPLICATIONS  Generally, a coronary angiogram is a safe procedure. However, problems can occur and include:  Allergic reaction to the dye.  Bleeding from the access site or other locations.  Kidney injury, especially in people with impaired kidney function.  Stroke (rare).  Heart attack (rare). BEFORE THE PROCEDURE   Do not eat or drink anything after midnight the night before the procedure or as directed by your health care provider.   Ask your health care provider about changing or stopping your regular medicines. This is especially important if you are taking diabetes medicines or blood thinners. PROCEDURE  You may be given a medicine to help you relax (sedative) before the procedure. This medicine is given through an intravenous (IV) access tube that is inserted into one of your veins.   The area where the catheter will be inserted will be washed and shaved. This is usually done in the groin but may be done in the fold of your arm (near your elbow) or in the wrist.   A medicine  will be given to numb the area where the catheter will be inserted (local anesthetic).   The health care provider will insert the catheter into an artery. The catheter will be guided by using a special type of X-ray (fluoroscopy) of the blood vessel being examined.   A special dye will then be injected into the catheter, and X-rays will be taken. The dye will help to show where any narrowing or blockages are located in the heart arteries.  AFTER THE  PROCEDURE   If the procedure is done through the leg, you will be kept in bed lying flat for several hours. You will be instructed to not bend or cross your legs.  The insertion site will be checked frequently.   The pulse in your feet or wrist will be checked frequently.   Additional blood tests, X-rays, and an electrocardiogram may be done.    This information is not intended to replace advice given to you by your health care provider. Make sure you discuss any questions you have with your health care provider.   Document Released: 01/02/2003 Document Revised: 07/19/2014 Document Reviewed: 11/20/2012 Elsevier Interactive Patient Education [employer].    If you need a refill on your cardiac medications before your next appointment, please call your pharmacy.

## 2015-07-10 ENCOUNTER — Encounter (HOSPITAL_COMMUNITY)
Admission: RE | Disposition: A | Discharge status: Home / Self Care | Payer: Self-pay | Source: Ambulatory Visit | Attending: Cardiovascular Disease

## 2015-07-10 ENCOUNTER — Encounter (HOSPITAL_COMMUNITY): Payer: Self-pay | Admitting: *Deleted

## 2015-07-10 ENCOUNTER — Observation Stay (HOSPITAL_COMMUNITY)
Admission: RE | Admit: 2015-07-10 | Discharge: 2015-07-11 | Disposition: A | Discharge status: Home / Self Care | Payer: BLUE CROSS/BLUE SHIELD | Source: Ambulatory Visit | Attending: Cardiovascular Disease | Admitting: Cardiovascular Disease

## 2015-07-10 ENCOUNTER — Ambulatory Visit (HOSPITAL_COMMUNITY): Payer: BLUE CROSS/BLUE SHIELD

## 2015-07-10 DIAGNOSIS — R51 Headache: Secondary | ICD-10-CM | POA: Diagnosis not present

## 2015-07-10 DIAGNOSIS — G4489 Other headache syndrome: Secondary | ICD-10-CM

## 2015-07-10 DIAGNOSIS — Z7982 Long term (current) use of aspirin: Secondary | ICD-10-CM | POA: Insufficient documentation

## 2015-07-10 DIAGNOSIS — Z79899 Other long term (current) drug therapy: Secondary | ICD-10-CM | POA: Diagnosis not present

## 2015-07-10 DIAGNOSIS — I2582 Chronic total occlusion of coronary artery: Secondary | ICD-10-CM | POA: Diagnosis not present

## 2015-07-10 DIAGNOSIS — I2510 Atherosclerotic heart disease of native coronary artery without angina pectoris: Secondary | ICD-10-CM | POA: Insufficient documentation

## 2015-07-10 DIAGNOSIS — Z87891 Personal history of nicotine dependence: Secondary | ICD-10-CM | POA: Diagnosis not present

## 2015-07-10 DIAGNOSIS — G4733 Obstructive sleep apnea (adult) (pediatric): Secondary | ICD-10-CM | POA: Insufficient documentation

## 2015-07-10 DIAGNOSIS — E538 Deficiency of other specified B group vitamins: Secondary | ICD-10-CM | POA: Diagnosis not present

## 2015-07-10 DIAGNOSIS — Z8551 Personal history of malignant neoplasm of bladder: Secondary | ICD-10-CM | POA: Insufficient documentation

## 2015-07-10 DIAGNOSIS — I10 Essential (primary) hypertension: Secondary | ICD-10-CM | POA: Insufficient documentation

## 2015-07-10 DIAGNOSIS — J449 Chronic obstructive pulmonary disease, unspecified: Secondary | ICD-10-CM | POA: Diagnosis not present

## 2015-07-10 DIAGNOSIS — Z791 Long term (current) use of non-steroidal anti-inflammatories (NSAID): Secondary | ICD-10-CM | POA: Diagnosis not present

## 2015-07-10 DIAGNOSIS — E119 Type 2 diabetes mellitus without complications: Secondary | ICD-10-CM | POA: Diagnosis not present

## 2015-07-10 DIAGNOSIS — I25119 Atherosclerotic heart disease of native coronary artery with unspecified angina pectoris: Secondary | ICD-10-CM | POA: Insufficient documentation

## 2015-07-10 DIAGNOSIS — R519 Headache, unspecified: Secondary | ICD-10-CM

## 2015-07-10 DIAGNOSIS — R9439 Abnormal result of other cardiovascular function study: Secondary | ICD-10-CM | POA: Diagnosis present

## 2015-07-10 DIAGNOSIS — I25719 Atherosclerosis of autologous vein coronary artery bypass graft(s) with unspecified angina pectoris: Secondary | ICD-10-CM | POA: Diagnosis not present

## 2015-07-10 DIAGNOSIS — I25709 Atherosclerosis of coronary artery bypass graft(s), unspecified, with unspecified angina pectoris: Secondary | ICD-10-CM | POA: Diagnosis not present

## 2015-07-10 HISTORY — PX: CARDIAC CATHETERIZATION: SHX172

## 2015-07-10 LAB — GLUCOSE, CAPILLARY
GLUCOSE-CAPILLARY: 111 mg/dL — AB (ref 65–99)
Glucose-Capillary: 132 mg/dL — ABNORMAL HIGH (ref 65–99)

## 2015-07-10 LAB — MRSA PCR SCREENING: MRSA BY PCR: NEGATIVE

## 2015-07-10 SURGERY — LEFT HEART CATH AND CORS/GRAFTS ANGIOGRAPHY
Anesthesia: LOCAL

## 2015-07-10 MED ORDER — MOMETASONE FURO-FORMOTEROL FUM 100-5 MCG/ACT IN AERO
2.0000 | INHALATION_SPRAY | Freq: Two times a day (BID) | RESPIRATORY_TRACT | Status: DC
  Administered 2015-07-11: 08:00:00 2 via RESPIRATORY_TRACT
  Filled 2015-07-10 (×2): qty 8.8

## 2015-07-10 MED ORDER — SODIUM CHLORIDE 0.9 % IJ SOLN: 3.0000 mL | Freq: Two times a day (BID) | INTRAMUSCULAR | Status: DC

## 2015-07-10 MED ORDER — FAMOTIDINE 20 MG PO TABS
20.0000 mg | ORAL_TABLET | Freq: Two times a day (BID) | ORAL | Status: DC
  Administered 2015-07-10 – 2015-07-11 (×2): 20 mg via ORAL
  Filled 2015-07-10 (×2): qty 1

## 2015-07-10 MED ORDER — HYDRALAZINE HCL 20 MG/ML IJ SOLN
INTRAMUSCULAR | Status: DC | PRN
  Administered 2015-07-10: 20 mg via INTRAVENOUS

## 2015-07-10 MED ORDER — ALBUTEROL SULFATE (2.5 MG/3ML) 0.083% IN NEBU
2.5000 mg | INHALATION_SOLUTION | Freq: Four times a day (QID) | RESPIRATORY_TRACT | Status: DC
  Administered 2015-07-10 – 2015-07-11 (×3): 2.5 mg via RESPIRATORY_TRACT
  Filled 2015-07-10 (×2): qty 3

## 2015-07-10 MED ORDER — ONDANSETRON HCL 4 MG/2ML IJ SOLN
4.0000 mg | Freq: Once | INTRAMUSCULAR | Status: AC
  Administered 2015-07-10: 4 mg via INTRAVENOUS

## 2015-07-10 MED ORDER — MIDAZOLAM HCL 2 MG/2ML IJ SOLN
INTRAMUSCULAR | Status: DC | PRN
  Administered 2015-07-10: 2 mg via INTRAVENOUS

## 2015-07-10 MED ORDER — CARVEDILOL 3.125 MG PO TABS
3.1250 mg | ORAL_TABLET | Freq: Two times a day (BID) | ORAL | Status: DC
  Administered 2015-07-10 – 2015-07-11 (×2): 3.125 mg via ORAL
  Filled 2015-07-10 (×2): qty 1

## 2015-07-10 MED ORDER — ALBUTEROL SULFATE HFA 108 (90 BASE) MCG/ACT IN AERS
2.0000 | INHALATION_SPRAY | Freq: Four times a day (QID) | RESPIRATORY_TRACT | Status: DC
  Administered 2015-07-10: 2 via RESPIRATORY_TRACT
  Filled 2015-07-10: qty 6.7

## 2015-07-10 MED ORDER — FENTANYL CITRATE (PF) 100 MCG/2ML IJ SOLN
INTRAMUSCULAR | Status: AC
  Filled 2015-07-10: qty 2

## 2015-07-10 MED ORDER — LIDOCAINE HCL (PF) 1 % IJ SOLN
INTRAMUSCULAR | Status: DC | PRN
  Administered 2015-07-10: 12:00:00

## 2015-07-10 MED ORDER — VITAMIN B-12 1000 MCG PO TABS
1000.0000 ug | ORAL_TABLET | Freq: Every day | ORAL | Status: DC
  Filled 2015-07-10: qty 1

## 2015-07-10 MED ORDER — PROMETHAZINE HCL 25 MG/ML IJ SOLN
25.0000 mg | Freq: Once | INTRAMUSCULAR | Status: AC
  Administered 2015-07-10: 25 mg via INTRAVENOUS

## 2015-07-10 MED ORDER — OXYCODONE-ACETAMINOPHEN 5-325 MG PO TABS
2.0000 | ORAL_TABLET | Freq: Once | ORAL | Status: AC
  Administered 2015-07-10: 2 via ORAL

## 2015-07-10 MED ORDER — MORPHINE SULFATE (PF) 2 MG/ML IV SOLN
2.0000 mg | INTRAVENOUS | Status: DC | PRN
  Administered 2015-07-10 (×2): 2 mg via INTRAVENOUS
  Filled 2015-07-10: qty 1

## 2015-07-10 MED ORDER — SODIUM CHLORIDE 0.9 % IV SOLN: INTRAVENOUS | Status: DC

## 2015-07-10 MED ORDER — IOHEXOL 350 MG/ML SOLN
INTRAVENOUS | Status: DC | PRN
  Administered 2015-07-10: 110 mL via INTRAVENOUS

## 2015-07-10 MED ORDER — FENTANYL CITRATE (PF) 100 MCG/2ML IJ SOLN
INTRAMUSCULAR | Status: DC | PRN
  Administered 2015-07-10 (×2): 50 ug via INTRAVENOUS

## 2015-07-10 MED ORDER — ASPIRIN 81 MG PO CHEW
81.0000 mg | CHEWABLE_TABLET | ORAL | Status: AC
  Administered 2015-07-10: 81 mg via ORAL

## 2015-07-10 MED ORDER — ALBUTEROL SULFATE (2.5 MG/3ML) 0.083% IN NEBU
INHALATION_SOLUTION | RESPIRATORY_TRACT | Status: AC
  Filled 2015-07-10: qty 3

## 2015-07-10 MED ORDER — SODIUM CHLORIDE 0.9 % IV SOLN: 250.0000 mL | INTRAVENOUS | Status: DC | PRN

## 2015-07-10 MED ORDER — LIDOCAINE HCL (PF) 1 % IJ SOLN
INTRAMUSCULAR | Status: DC | PRN
  Administered 2015-07-10: 20 mL

## 2015-07-10 MED ORDER — SODIUM CHLORIDE 0.9 % WEIGHT BASED INFUSION
3.0000 mL/kg/h | INTRAVENOUS | Status: DC
  Administered 2015-07-10: 3 mL/kg/h via INTRAVENOUS

## 2015-07-10 MED ORDER — HEPARIN (PORCINE) IN NACL 2-0.9 UNIT/ML-% IJ SOLN
INTRAMUSCULAR | Status: AC
  Filled 2015-07-10: qty 1000

## 2015-07-10 MED ORDER — HYDROCODONE-ACETAMINOPHEN 5-325 MG PO TABS
2.0000 | ORAL_TABLET | Freq: Three times a day (TID) | ORAL | Status: DC | PRN
  Administered 2015-07-10: 22:00:00 2 via ORAL
  Filled 2015-07-10: qty 2

## 2015-07-10 MED ORDER — ONDANSETRON HCL 4 MG/2ML IJ SOLN
4.0000 mg | Freq: Four times a day (QID) | INTRAMUSCULAR | Status: DC | PRN
  Administered 2015-07-10: 20:00:00 4 mg via INTRAVENOUS
  Filled 2015-07-10: qty 2

## 2015-07-10 MED ORDER — PROMETHAZINE HCL 25 MG/ML IJ SOLN
INTRAMUSCULAR | Status: AC
  Filled 2015-07-10: qty 1

## 2015-07-10 MED ORDER — ACETAMINOPHEN 325 MG PO TABS: 650.0000 mg | ORAL_TABLET | ORAL | Status: DC | PRN

## 2015-07-10 MED ORDER — ALBUTEROL SULFATE (2.5 MG/3ML) 0.083% IN NEBU
3.0000 mL | INHALATION_SOLUTION | Freq: Four times a day (QID) | RESPIRATORY_TRACT | Status: DC
  Filled 2015-07-10 (×4): qty 3

## 2015-07-10 MED ORDER — ASPIRIN EC 325 MG PO TBEC
325.0000 mg | DELAYED_RELEASE_TABLET | Freq: Every day | ORAL | Status: DC
  Administered 2015-07-11: 10:00:00 325 mg via ORAL

## 2015-07-10 MED ORDER — ASPIRIN 81 MG PO CHEW
CHEWABLE_TABLET | ORAL | Status: AC
  Filled 2015-07-10: qty 1

## 2015-07-10 MED ORDER — ATORVASTATIN CALCIUM 20 MG PO TABS: 20.0000 mg | ORAL_TABLET | Freq: Every day | ORAL | Status: DC

## 2015-07-10 MED ORDER — LEVALBUTEROL HCL 0.63 MG/3ML IN NEBU
0.6300 mg | INHALATION_SOLUTION | Freq: Four times a day (QID) | RESPIRATORY_TRACT | Status: DC | PRN
  Filled 2015-07-10: qty 3

## 2015-07-10 MED ORDER — MIDAZOLAM HCL 2 MG/2ML IJ SOLN
INTRAMUSCULAR | Status: AC
  Filled 2015-07-10: qty 2

## 2015-07-10 MED ORDER — ONDANSETRON HCL 4 MG/2ML IJ SOLN
INTRAMUSCULAR | Status: AC
  Filled 2015-07-10: qty 2

## 2015-07-10 MED ORDER — SODIUM CHLORIDE 0.9 % IJ SOLN: 3.0000 mL | INTRAMUSCULAR | Status: DC | PRN

## 2015-07-10 MED ORDER — SODIUM CHLORIDE 0.9 % WEIGHT BASED INFUSION: 1.0000 mL/kg/h | INTRAVENOUS | Status: DC

## 2015-07-10 MED ORDER — HYDRALAZINE HCL 20 MG/ML IJ SOLN
20.0000 mg | Freq: Four times a day (QID) | INTRAMUSCULAR | Status: DC | PRN
  Administered 2015-07-10: 20 mg via INTRAVENOUS
  Filled 2015-07-10: qty 1

## 2015-07-10 MED ORDER — SODIUM CHLORIDE 0.9 % IV SOLN: INTRAVENOUS | Status: AC

## 2015-07-10 MED ORDER — LIDOCAINE HCL (PF) 1 % IJ SOLN
INTRAMUSCULAR | Status: AC
  Filled 2015-07-10: qty 30

## 2015-07-10 MED ORDER — LISINOPRIL 10 MG PO TABS
10.0000 mg | ORAL_TABLET | Freq: Every day | ORAL | Status: DC
  Administered 2015-07-11: 10 mg via ORAL
  Filled 2015-07-10: qty 1

## 2015-07-10 MED ORDER — OXYCODONE-ACETAMINOPHEN 5-325 MG PO TABS
ORAL_TABLET | ORAL | Status: AC
  Filled 2015-07-10: qty 2

## 2015-07-10 MED ORDER — MORPHINE SULFATE (PF) 2 MG/ML IV SOLN
INTRAVENOUS | Status: AC
  Filled 2015-07-10: qty 1

## 2015-07-10 SURGICAL SUPPLY — 10 items
CATH INFINITI 5 FR IM (CATHETERS) ×2 IMPLANT
CATH INFINITI 5FR MULTPACK ANG (CATHETERS) ×2 IMPLANT
DEVICE RAD COMP TR BAND LRG (VASCULAR PRODUCTS) ×2 IMPLANT
KIT HEART LEFT (KITS) ×2 IMPLANT
PACK CARDIAC CATHETERIZATION (CUSTOM PROCEDURE TRAY) ×2 IMPLANT
SHEATH PINNACLE 5F 10CM (SHEATH) ×2 IMPLANT
SYR MEDRAD MARK V 150ML (SYRINGE) ×2 IMPLANT
TRANSDUCER W/STOPCOCK (MISCELLANEOUS) ×2 IMPLANT
WIRE EMERALD 3MM-J .035X150CM (WIRE) ×2 IMPLANT
WIRE EMERALD 3MM-J .035X260CM (WIRE) ×2 IMPLANT

## 2015-07-10 NOTE — H&P (View-Only) (Signed)
Patient ID: Carl Lee, male   DOB: 1956-02-24, 59 y.o.   MRN: [id number] Carl Lee returns today for a scheduled followup visit.  He is a 59 y.o.  gentleman who was noted to have an abnormal EKG on a preoperative evaluation prior to having bladder surgery. He had a stress test which was abnormal and that led to cardiac catheterization, which revealed severe three-vessel coronary disease.   He underwent coronary bypass grafting x3 with bilateral mammaries on February 07 2014 . LIMA to LAD.  RIMA to OM via the oblique sinus, and SVG to PDA  He was extubated early postoperatively, but had severe wheezing and respiratory distress and had to be reintubated that evening. He was subsequently extubated the following day. He continued to have some issues with wheezing. He was discharged on dulera and Xopenex inhalers.   Also has history of AAA surgery in 2013 EVAR  [place] 5/15 with residual 4.4 cm AAA sac  CABG:  Carl Lee July 2015 PROCEDURE: Median sternotomy, extracorporeal circulation, coronary artery bypass grafting x3 (left internal mammary artery to LAD, right internal mammary artery to obtuse marginal via the transverse sinus, saphenous vein graft to posterior descending), endoscopic vein harvest, right thigh.  Seeing Carl Lee for OSA now on CPAP Saw her 06/2014 and complained of chest pain;  Myovue ordered Reveiwed 06/29/15   Nuclear stress EF: 60%.  There was no ST segment deviation noted during stress.  Defect 1: There is a large defect of moderate severity present in the mid anterior, mid anteroseptal, apical anterior, apical septal, apical lateral and apex location.  Findings consistent with prior myocardial infarction with peri-infarct ischemia.  This is an intermediate risk study.  The left ventricular ejection fraction is normal (55-65%).  When compared with the prior myoview of 2005, there is improvement. The ejection fraction has improved from 49% to 60 %. The extent of the  large anteroapical scar is less, and the Total Perfusion Deficit has decreased from 34% to 17%. There are no EKG changes of ischemia.   ROS: Denies fever, malais, weight loss, blurry vision, decreased visual acuity, cough, sputum, SOB, hemoptysis, pleuritic pain, palpitaitons, heartburn, abdominal pain, melena, lower extremity edema, claudication, or rash.  All other systems reviewed and negative  General: Affect appropriate Healthy:  appears stated age 59: normal Neck supple with no adenopathy JVP normal no bruits no thyromegaly Lungs clear with no wheezing and good diaphragmatic motion Heart:  S1/S2 no murmur, no rub, gallop or click PMI normal Abdomen: benighn, BS positve, no tenderness, no AAA no bruit.  No HSM or HJR Distal pulses intact with no bruits No edema Neuro non-focal Skin warm and dry No muscular weakness   Current Outpatient Prescriptions  Medication Sig Dispense Refill  . albuterol (PROAIR HFA) 108 (90 BASE) MCG/ACT inhaler Inhale 2 puffs into the lungs every 6 (six) hours as needed for wheezing or shortness of breath. 1 Inhaler 2  . aspirin EC 325 MG EC tablet Take 1 tablet (325 mg total) by mouth daily.    [name] atorvastatin (LIPITOR) 20 MG tablet Take 1 tablet (20 mg total) by mouth daily at 6 PM. 90 tablet 3  . carvedilol (COREG) 3.125 MG tablet Take 0.5 tablets (1.5625 mg total) by mouth 2 (two) times daily with a meal. 60 tablet 3  . diclofenac sodium (VOLTAREN) 1 % GEL Apply topically 4 (four) times daily.    . famotidine (PEPCID) 20 MG tablet Take 20 mg by mouth 2 (two) times daily.  3  . fluticasone-salmeterol (ADVAIR HFA) 115-21 MCG/ACT inhaler Inhale 2 puffs into the lungs 2 (two) times daily.    [name] HYDROcodone-acetaminophen (NORCO/VICODIN) 5-325 MG tablet Take 1 tablet by mouth every 8 (eight) hours as needed for severe pain. 30 tablet 0  . levalbuterol (XOPENEX HFA) 45 MCG/ACT inhaler Inhale 1-2 puffs into the lungs every 6 (six) hours as needed for  wheezing. 1 Inhaler 12  . lisinopril (PRINIVIL,ZESTRIL) 10 MG tablet Take 1 tablet (10 mg total) by mouth daily. 90 tablet 3  . vitamin B-12 (CYANOCOBALAMIN) 1000 MCG tablet Take 1 tablet (1,000 mcg total) by mouth daily.     No current facility-administered medications for this visit.    Allergies  Codeine and Doxycycline  Electrocardiogram:   07/10/14 SR rate 53 normal   Assessment and Plan CAD:  Post CABG with persistent pain CT with good healing of sternum  Myovue 06/2015 improved but abnormal.  Will Arrange heart cath for persistent chest pain, dyspnea post surgery Labs today will get back in cardiac rehab OSA:  Much improved with CPAP HTN:  BP labile initially better on CPAP.  Indicates higher dose ACE drops it too much Continue coreg and lisinopril Chol:  On statin   Cholesterol is at goal.  Continue current dose of statin and diet Rx.  No myalgias or side effects.  F/U  LFT's in 6 months. Lab Results  Component Value Date   LDLCALC 46 06/20/2015      L       Carl Lee

## 2015-07-10 NOTE — Progress Notes (Signed)
Anderson Malta notified that copy of EKG needed from Office done 07/09/15

## 2015-07-10 NOTE — Interval H&P Note (Signed)
History and Physical Interval Note:  07/10/2015 11:52 AM  Carl Lee  has presented today for cardiac cath with the diagnosis of SOB, unstable angina. The various methods of treatment have been discussed with the patient and family. After consideration of risks, benefits and other options for treatment, the patient has consented to  Procedure(s): Left Heart Cath and Cors/Grafts Angiography (N/A) as a surgical intervention .  The patient's history has been reviewed, patient examined, no change in status, stable for surgery.  I have reviewed the patient's chart and labs.  Questions were answered to the patient's satisfaction.    Cath Lab Visit (complete for each Cath Lab visit)  Clinical Evaluation Leading to the Procedure:   ACS: No.  Non-ACS:    Anginal Classification: CCS III  Anti-ischemic medical therapy: Minimal Therapy (1 class of medications)  Non-Invasive Test Results: Intermediate-risk stress test findings: cardiac mortality 1-3%/year  Prior CABG: Previous CABG        Carl Lee

## 2015-07-10 NOTE — Progress Notes (Signed)
Pt arrived for outpatient cardiac cath today and had onset of severe headache before his procedure while in the short stay area. The diagnostic procedure was uncomplicated. No anti-coagulation was used. No PCI performed. Pt with worsened headache post procedure. No focal neurological changes but pt with nausea. No visual changes.   Over the last hour we have tried ante-emetics and narcotics but with no resolution of symptoms. I have discussed admission and head CT to exclude hemorrhage. He does have a history of migraines. He is at this time refusing admission. Will proceed with STAT head CT.   Further plans to follow.   MCALHANY,CHRISTOPHER 07/10/2015 3:30 PM

## 2015-07-10 NOTE — Progress Notes (Signed)
Site area: RFA Site Prior to Removal:  Level 0 Pressure Applied For:20 min Manual:   yes Patient Status During Pull:  HA/co SOB Dr Shon Hough in [place] Site:  Level 0 Post Pull Instructions Given:yes   Post Pull Pulses Present: palpable Dressing Applied:  clear Bedrest begins @ 1330 [id number] Comments:

## 2015-07-10 NOTE — Progress Notes (Signed)
Dr Shon Hough in. Pt is resting quietly, breathing is non labored but continues to report no change in headache. No neuro changes noted. Will continue to observe and medicate as needed for now.

## 2015-07-10 NOTE — Progress Notes (Signed)
Returned from CT/head. Pt is resting comfortably.

## 2015-07-10 NOTE — Progress Notes (Addendum)
Pt co HA, [place] and shortness of breath during groin hold. Dr Shon Hough in. O2 sat remains high 90's. Relief seen post meds. Observing for now. Still co headache.

## 2015-07-10 NOTE — H&P (Signed)
Patient ID: Carl Lee MRN: [id number] DOB/AGE: Aug 15, 1955 59 y.o. Admit date: 07/10/2015  Primary Cardiologist: [name]  HPI: 59 yo male with history of HTN, AAA, CAD s/p CABG, migraine headaches, spinal stenosis, OSA, DM, COPD who was here today for cardiac cath following abnormal nuclear stress test. His cath was uneventful. No anti-coagulation was used. No PCI performed. The patient had onset of headache upon arrival to the hospital this am before his cath. The headache persisted through the case and worsened following the case. He also had nausea but no visual changes or neurological deficits. The patient has chronic back pain and leg pain as well as chest wall pain since he had bypass. These complaints are stable. His headache did not respond to narcotics. Nausea improved with anti-emetics. Head CT with no acute abnormalites. No evidence of intracranial hemorrhage or masses.   The patient will be admitted for monitoring tonight.   Review of systems complete and found to be negative unless listed above   Past Medical History  Diagnosis Date  . Bladder cancer ([place]) 08/16/2008    [facility]ell then [place] now [place])  . Hypertension   . AAA (abdominal aortic aneurysm) ([place]) 03/25/12    s/p endovascular repair  . Hx of migraines   . Contact dermatitis     atypical [name])  . Childhood asthma   . Environmental allergies     improved as ages  . Diverticulosis 2013    severe by CT and colonoscopy  . Adenomatous polyp 06/2009  . Lumbosacral radiculopathy at S1 03/2012    left, with spinal stenosis (MRI 04/2013) improved with TF ESI L5/S1 and S1/2 ([facility] [place])  . Spinal stenosis     LS1 nerve root impingement from bulging disc  . Coronary artery disease   . Zenker diverticulum   . Resistance to clopidogrel 2015    drug metabolism panel run - asked to scan  . OSA (obstructive sleep apnea) 09/04/2014    Severe with AHI 35/hr  . Vitamin B12 deficiency   . Diabetes mellitus  type 2, controlled, with complications ([place]) [id number]    CAD   . COPD (chronic obstructive pulmonary disease) ([place])   . Diabetes mellitus without complication ([place])     Type II  . Fracture of lateral malleolus of left ankle 08/29/2013  . Hepatic steatosis 06/2015    by [place]    Family History  Problem Relation Age of Onset  . Diabetes Mother   . Arrhythmia Mother     pacemaker  . Cancer Mother     Bladder  . COPD Mother   . Thyroid disease Mother   . Hyperlipidemia Mother   . Hypertension Mother   . Diabetes Father   . CAD Father 48    CHF, MI  . Dementia Father   . Heart attack Father   . Diabetes Sister   . Colon cancer Neg Hx   . Diabetes Brother   . Hypertension Brother   . Heart attack Maternal Grandmother     Social History   Social History  . Marital Status: Married    Spouse Name: N/A  . Number of Children: 2  . Years of Education: N/A   Occupational History  . Self Employed    Social History Main Topics  . Smoking status: Former Smoker -- 1.00 packs/day for 40 years    Types: Cigarettes    Quit date: 02/05/2014  . Smokeless tobacco: Never Used  . Alcohol Use: 0.0  oz/week    0 Standard drinks or equivalent per week     Comment: Rare- states 1 oz. per week  . Drug Use: No  . Sexual Activity: Not on file   Other Topics Concern  . Not on file   Social History Narrative   Caffeine: 1 cup coffee/day   Lives with wife, 1 dog   grown children   Occupation: Therapist, sports - self employed.  Not working, applied for disability.    Edu: 2 yrs college   Activity: fishing, walking occasionally   Diet: good water, fruits/vegetables daily    Past Surgical History  Procedure Laterality Date  . Tonsillectomy and adenoidectomy  1973  . Vasectomy    . Bladder cancer  March 2010 and Oct. 2015    [facility] Spell) x 2  . Abdominal aortic aneurysm repair  03/25/12    endovascular  . Colonoscopy  06/2012    hyperplastic polyp, diverticulosis ([place]) rec rpt 5  yrs  . Cystoscopy  08/16/08    Bladder Cancer  . Esi  04/2013, 06/2013    L L5S1, S12 transforaminal ESI (Dr.  [name])  . Abi  05/2013    WNL, L TBI low at 0.66  . Coronary artery bypass graft N/A 02/07/2014    Procedure: CORONARY ARTERY BYPASS GRAFTING (CABG);  Surgeon: [name], MD;  Location: [facility];  Service: Open Heart Surgery;  Laterality: N/A;  CABG X 3, BILATERAL LIMA, EVH  . Intraoperative transesophageal echocardiogram N/A 02/07/2014    Procedure: INTRAOPERATIVE TRANSESOPHAGEAL ECHOCARDIOGRAM;  Surgeon: [name], MD;  Location: [place];  Service: Open Heart Surgery;  Laterality: N/A;  . Esi Left 04/2014, 05/2014, 06/2014    L5/S1, S1/2; rpt; L4/5  . Left heart catheterization with coronary angiogram N/A 02/01/2014    Procedure: LEFT HEART CATHETERIZATION WITH CORONARY ANGIOGRAM;  Surgeon: [name], MD;  Location[facility] CATH LAB;  Service: Cardiovascular;  Laterality: N/A;  . Epidural block injection Left 09/2014, 10/2014, 12/2014    medial L2,3,4, dorsal L5 ramus blocks x2, L L5/S1 and S1/2 transforaminal ESI ([facility] [place])    Allergies  Allergen Reactions  . Codeine Other (See Comments)    Nightmares  . Doxycycline Hives    mild    Prior to Admission Meds:  Prior to Admission medications   Medication Sig Start Date End Date Taking? Authorizing Provider  aspirin EC 325 MG EC tablet Take 1 tablet (325 mg total) by mouth daily. 02/13/14  Yes Wayne E Gold, PA-C  atorvastatin (LIPITOR) 20 MG tablet Take 1 tablet (20 mg total) by mouth daily at 6 PM. 09/23/14  Yes [name], MD  carvedilol (COREG) 3.125 MG tablet Take 0.5 tablets (1.5625 mg total) by mouth 2 (two) times daily with a meal. 07/02/15  Yes [name], MD  famotidine (PEPCID) 20 MG tablet Take 20 mg by mouth 2 (two) times daily. 06/12/15  Yes Historical Provider, MD  fluticasone-salmeterol (ADVAIR HFA) 115-21 MCG/ACT inhaler Inhale 2 puffs into the lungs 2 (two) times daily.    Yes Historical Provider, MD  HYDROcodone-acetaminophen (NORCO/VICODIN) 5-325 MG tablet Take 1 tablet by mouth every 8 (eight) hours as needed for severe pain. 06/24/15  Yes [name], MD  levalbuterol [facility] HFA) 45 MCG/ACT inhaler Inhale 1-2 puffs into the lungs every 6 (six) hours as needed for wheezing. 05/01/15  Yes Juanito Doom, MD  lisinopril (PRINIVIL,ZESTRIL) 10 MG tablet Take 1 tablet (10 mg total) by mouth daily. 01/22/15  Yes  Josue Hector, MD  vitamin B-12 (CYANOCOBALAMIN) 1000 MCG tablet Take 1 tablet (1,000 mcg total) by mouth daily. 06/21/14  Yes [name], MD  albuterol (PROAIR HFA) 108 (90 BASE) MCG/ACT inhaler Inhale 2 puffs into the lungs every 6 (six) hours as needed for wheezing or shortness of breath. 05/08/15   Juanito Doom, MD  diclofenac sodium (VOLTAREN) 1 % GEL Apply topically 4 (four) times daily.    Historical Provider, MD    Physical Exam: Blood pressure 183/86, pulse 75, temperature 98.1 F (36.7 C), temperature source Oral, resp. rate 16, height 5\' 7"  (1.702 m), weight 210 lb (95.255 kg), SpO2 95 %.    General: Well developed, well nourished, NAD. Ill appearing.  HEENT: OP clear, mucus membranes moist  SKIN: warm, dry. No rashes.  Neuro: No focal deficits  Musculoskeletal: Muscle strength 5/5 all ext  Psychiatric: Mood and affect normal  Neck: No JVD, no carotid bruits, no thyromegaly, no lymphadenopathy.  Lungs:Clear bilaterally, no wheezes, rhonci, crackles  Cardiovascular: Regular rate and rhythm. No murmurs, gallops or rubs.  Abdomen:Soft. Bowel sounds present. Non-tender.  Extremities: No lower extremity edema. .   Labs:   Lab Results  Component Value Date   WBC 6.8 07/09/2015   HGB 16.1 07/09/2015   HCT 46.8 07/09/2015   MCV 92.3 07/09/2015   PLT 248 07/09/2015    Recent Labs Lab 07/09/15 0859  NA 140  K 4.0  CL 106  CO2 22  BUN 20  CREATININE 0.83  CALCIUM 8.9  GLUCOSE 119*    Head CT: 07/10/15: No acute  intracranial abnormality. Specifically, no hemorrhage, hydrocephalus, mass lesion, acute infarction, or significant intracranial injury. No acute calvarial abnormality. Visualized paranasal sinuses and mastoids clear. Orbital soft tissues unremarkable.  IMPRESSION: No acute intracranial abnormality.  ASSESSMENT AND PLAN:   1. Headache: no focal neurological changes. No acute changes on Head CT. This is likely a complex migraine. Will use narcotics tonight. Admit to telemetry unit. If headache persists, Neurology consult in am.   2. CAD: Stable.   3. HTN: BP elevated today. Will increase Coreg and Lisinopril. Hydralazine prn. May need to adjust in am.   Darlina Guys, MD 07/10/2015, 4:41 PM

## 2015-07-11 ENCOUNTER — Encounter (HOSPITAL_COMMUNITY): Payer: Self-pay | Admitting: Cardiovascular Disease

## 2015-07-11 DIAGNOSIS — G4489 Other headache syndrome: Secondary | ICD-10-CM | POA: Diagnosis not present

## 2015-07-11 DIAGNOSIS — I25119 Atherosclerotic heart disease of native coronary artery with unspecified angina pectoris: Secondary | ICD-10-CM | POA: Diagnosis not present

## 2015-07-11 DIAGNOSIS — I25709 Atherosclerosis of coronary artery bypass graft(s), unspecified, with unspecified angina pectoris: Secondary | ICD-10-CM | POA: Diagnosis not present

## 2015-07-11 DIAGNOSIS — I25719 Atherosclerosis of autologous vein coronary artery bypass graft(s) with unspecified angina pectoris: Secondary | ICD-10-CM | POA: Diagnosis not present

## 2015-07-11 DIAGNOSIS — I2582 Chronic total occlusion of coronary artery: Secondary | ICD-10-CM | POA: Diagnosis not present

## 2015-07-11 DIAGNOSIS — I10 Essential (primary) hypertension: Secondary | ICD-10-CM | POA: Diagnosis not present

## 2015-07-11 LAB — CBC
HCT: 47.1 % (ref 39.0–52.0)
Hemoglobin: 15.9 g/dL (ref 13.0–17.0)
MCH: 31.6 pg (ref 26.0–34.0)
MCHC: 33.8 g/dL (ref 30.0–36.0)
MCV: 93.6 fL (ref 78.0–100.0)
PLATELETS: 232 10*3/uL (ref 150–400)
RBC: 5.03 MIL/uL (ref 4.22–5.81)
RDW: 13.2 % (ref 11.5–15.5)
WBC: 11.4 10*3/uL — ABNORMAL HIGH (ref 4.0–10.5)

## 2015-07-11 LAB — GLUCOSE, CAPILLARY: Glucose-Capillary: 149 mg/dL — ABNORMAL HIGH (ref 65–99)

## 2015-07-11 MED ORDER — ONDANSETRON HCL 4 MG PO TABS: 4.0000 mg | ORAL_TABLET | Freq: Three times a day (TID) | ORAL | Status: DC | PRN

## 2015-07-11 NOTE — Progress Notes (Addendum)
Patient Name: Carl Lee Date of Encounter: 07/11/2015  Active Problems:   Headache   Coronary artery disease involving coronary bypass graft with unspecified angina pectoris    Primary Cardiologist: Dr. [name] Patient Profile: 59 yo male w/ PMH of CAD (s/p CABG in 2015 with LIMA-LAD, RIMA-OM, and SVG-PDA), AAA(s/p repair in 2013), OSA (on CPAP), Type 2 DM, and HTN who presented to [facility] on 07/10/2015 for cardiac catheterization.   SUBJECTIVE: Reports improvement in his headache from yesterday. Was a 10/10, now a 4/10. Reports having occasional headaches with elevated BP, bot nothing as severe as yesterday in the past 3 years. Denies any chest pain or shortness of breath. No complications following his cath.  OBJECTIVE Filed Vitals:   07/10/15 2045 07/11/15 0417 07/11/15 0436 07/11/15 0800  BP:  88/46 136/68 108/54  Pulse:  67 73 75  Temp:  97.8 F (36.6 C)  97.8 F (36.6 C)  TempSrc:  Oral  Oral  Resp:  17 18 18   Height:      Weight:  213 lb 6.5 oz (96.8 kg)    SpO2: 92% 91% 98% 93%    Intake/Output Summary (Last 24 hours) at 07/11/15 0802 Last data filed at 07/11/15 0801  Gross per 24 hour  Intake    320 ml  Output    675 ml  Net   -355 ml   Filed Weights   07/10/15 0836 07/11/15 0417  Weight: 210 lb (95.255 kg) 213 lb 6.5 oz (96.8 kg)    PHYSICAL EXAM General: Well developed, well nourished, male in no acute distress. Head: Normocephalic, atraumatic.  Neck: Supple without bruits, JVD not elevated. Lungs:  Resp regular and unlabored, CTA without wheezing or rales. Heart: RRR, S1, S2, no S3, S4, or murmur; no rub. Abdomen: Soft, non-tender, non-distended with normoactive bowel sounds. No hepatomegaly. No rebound/guarding. No obvious abdominal masses. Extremities: No clubbing, cyanosis, or edema. Distal pedal pulses are 2+ bilaterally. Right groin cath site without ecchymosis or evidence of a hematoma. Neuro: Alert and oriented X 3. Moves all extremities  spontaneously. Psych: Normal affect.    LABS: CBC: Recent Labs  07/09/15 0859 07/11/15 0550  WBC 6.8 11.4*  NEUTROABS 4.2  --   HGB 16.1 15.9  HCT 46.8 47.1  MCV 92.3 93.6  PLT 248 232   INR: Recent Labs  07/09/15 0859  INR [id number]   Basic Metabolic Panel: Recent Labs  07/09/15 0859  NA 140  K 4.0  CL 106  CO2 22  GLUCOSE 119*  BUN 20  CREATININE 0.83  CALCIUM 8.9    TELE:  NSR with rate in 70's - 90's.        Cardiac Catheterization: 07/10/2015 1. Severe triple vessel CAD s/p 3V CABG with 2/3 patent bypass grafts.  2. Chronic occlusion mid LAD with patent LIMA graft to mid LAD.  3. Moderate stenosis proximal Circumflex with patent RIMA graft to OM.  4. Chronic occlusion proximal small caliber RCA. The SVG to the RCA is occluded but the distal RCA fills from left to right collaterals.  5. Preserved LV systolic function.  6. No focal targets for PCI.   Recommendations: Continue medical management of CAD.   Radiology/Studies: Ct Head Wo Contrast: 07/10/2015  CLINICAL DATA:  Sudden onset of frontal headache. EXAM: CT HEAD WITHOUT CONTRAST TECHNIQUE: Contiguous axial images were obtained from the base of the skull through the vertex without intravenous contrast. COMPARISON:  MRI 09/02/2014 FINDINGS: No acute intracranial  abnormality. Specifically, no hemorrhage, hydrocephalus, mass lesion, acute infarction, or significant intracranial injury. No acute calvarial abnormality. Visualized paranasal sinuses and mastoids clear. Orbital soft tissues unremarkable. IMPRESSION: No acute intracranial abnormality. Electronically Signed   By: Rolm Baptise M.D.   On: 07/10/2015 16:10     Current Medications:  . albuterol  2.5 mg Nebulization Q6H  . aspirin EC  325 mg Oral Daily  . atorvastatin  20 mg Oral q1800  . carvedilol  3.125 mg Oral BID WC  . famotidine  20 mg Oral BID  . lisinopril  10 mg Oral Daily  . mometasone-formoterol  2 puff Inhalation BID  . sodium  chloride  3 mL Intravenous Q12H  . vitamin B-12  1,000 mcg Oral Daily      ASSESSMENT AND PLAN:  1. Coronary artery disease involving coronary bypass graft with unspecified angina pectoris - s/p CABG in 2015 with LIMA-LAD, RIMA-OM, and SVG-PDA - Had NST on 06/25/2015 which showed peri-infarct ischemia and cath was recommended. - cath on 07/10/2015 showed patent LIMA-LAD and patent RIMA-OM. SVG-RCA was occluded but the distal RCA filled from left to right collaterals. Continued medical management was recommended. - continue ASA, statin, BB, and ACE-I. Could consider addition of anti-anginal such as Imdur but with labile BP, would need to be monitored closely.  2. Headache - Presented yesterday with a severe headache prior to his procedure, and was worse post-procedure. Has a history of migraines. Was having nausea and vomiting but no focal neuro deficits.  - Head CT showed no acute abnormalities. - wishes to follow-up with his PCP in regards to his headache. Reports having a full work-up several years ago and no etiology being found.  3. HTN - BP has been labile between 88/46 - 201/116. Was elevated during his severe headache yesterday. Has been better controlled overnight and this morning. - continue BB and ACE-I.   Arna Medici , PA-C 8:02 AM 07/11/2015 Pager: [phone]   I have examined the patient and reviewed assessment and plan and discussed with patient.  Agree with above as stated.  He had no angina prior to CABG.  He now has DOE after CABG.  No target for PCI.  I think he should try to increase exercise with cardiac rehab.  He does not think it will help but he is willing to try. Blood pressure has been labile. Currently stable. He may need a sliding scale on his lisinopril. Right groin stable. No hematoma. 2+ right posterior tibial pulse. Follow-up with Dr. [name].  Tamra Koos S.

## 2015-07-11 NOTE — Discharge Instructions (Signed)

## 2015-07-11 NOTE — Discharge Summary (Signed)
CARDIOLOGY DISCHARGE SUMMARY   Patient ID: EYON SIGERS MRN: [id number] DOB/AGE: [birth year] 61 y.o.  Admit date: 07/10/2015 Discharge date: 07/11/2015  PCP: [name], MD Primary Cardiologist: Dr. [name]  Primary Discharge Diagnosis: Coronary artery disease involving coronary bypass graft with unspecified angina pectoris Secondary Discharge Diagnosis: Headache, Labile HTN     Consults: None Procedures: Left Heart Catheterization, Coronary Angiography, Head CT   Hospital Course: MONTARIO DEVOL is a 59 y.o. male with past medical history of CAD (s/p CABG in 2015 with LIMA-LAD, RIMA-OM, and SVG-PDA), AAA(s/p repair in 2013), OSA (on CPAP), Type 2 DM, and HTN who presented to [facility] on 07/10/2015 for cardiac catheterization.  The patient was seen by Dr. Radford Pax for OSA in 06/2015 and reported having chest pain with exertion. Due to possible unstable angina, a NST was recommended which showed peri-infarct ischemia. He was then seen in the office by Dr. [name] and a cardiac catheterization was recommended. The risks and benefits of the procedure were discussed in detail with the patient and he agreed to proceed.  His catheterization showed patent LIMA-LAD and patent RIMA-OM. The SVG-RCA was occluded but the distal RCA filled from left to right collaterals. Continued medical management was recommended. The full report is included below.   Prior to his catheterization, he had developed a severe headache. He reported having occasional headaches with elevated BP but said this was worse than normal. Following the catheterization, his headache had progressed in severity. It was associated with nausea and vomiting but no focal neurological deficits were noted. A STAT Head CT was obtained which showed no acute abnormalities.   On the morning following his catheterization, he denied any episodes of chest pain or dyspnea. His right groin cath site was without evidence of a hematoma. His  headache was still present but had decreased in severity. He wishes to follow-up with his PCP in regards to his headaches due to having a thorough work-up several years ago which showed no identifiable etiology. He was given Zofran, 20 pills, with no refills to help with his nausea associated with his headache until he can follow-up with his PCP.  The patient was last examined by Dr. Irish Lack and deemed stable for discharge. He has scheduled Cardiology follow-up on 07/25/2015. His current medications were continued. His BP has been labile between 88/46 - 201/116 while admitted, therefore Imdur was not added to his regimen at this time but could be considered as an outpatient.  Labs:   Lab Results  Component Value Date   WBC 11.4* 07/11/2015   HGB 15.9 07/11/2015   HCT 47.1 07/11/2015   MCV 93.6 07/11/2015   PLT 232 07/11/2015     Recent Labs Lab 07/09/15 0859  NA 140  K 4.0  CL 106  CO2 22  BUN 20  CREATININE 0.83  CALCIUM 8.9  GLUCOSE 119*   No results for input(s): CKTOTAL, CKMB, CKMBINDEX, TROPONINI in the last 72 hours. Lipid Panel     Component Value Date/Time   CHOL 97 06/20/2015 0910   CHOL 170 01/05/2012   TRIG 83.0 06/20/2015 0910   TRIG 141 01/05/2012   HDL 34.20* 06/20/2015 0910   CHOLHDL 3 06/20/2015 0910   VLDL 16.6 06/20/2015 0910   LDLCALC 46 06/20/2015 0910   LDLDIRECT 105 01/05/2012     Recent Labs  07/09/15 0859  INR 0.99      Radiology:  Ct Head Wo Contrast: 07/10/2015  CLINICAL DATA:  Sudden onset of  frontal headache. EXAM: CT HEAD WITHOUT CONTRAST TECHNIQUE: Contiguous axial images were obtained from the base of the skull through the vertex without intravenous contrast. COMPARISON:  MRI 09/02/2014 FINDINGS: No acute intracranial abnormality. Specifically, no hemorrhage, hydrocephalus, mass lesion, acute infarction, or significant intracranial injury. No acute calvarial abnormality. Visualized paranasal sinuses and mastoids clear. Orbital soft  tissues unremarkable. IMPRESSION: No acute intracranial abnormality. Electronically Signed   By: Rolm Baptise M.D.   On: 07/10/2015 16:10    Cardiac Catheterization: 07/10/2015 1. Severe triple vessel CAD s/p 3V CABG with 2/3 patent bypass grafts.  2. Chronic occlusion mid LAD with patent LIMA graft to mid LAD.  3. Moderate stenosis proximal Circumflex with patent RIMA graft to OM.  4. Chronic occlusion proximal small caliber RCA. The SVG to the RCA is occluded but the distal RCA fills from left to right collaterals.  5. Preserved LV systolic function.  6. No focal targets for PCI.   Recommendations: Continue medical management of CAD.   EKG: NSR, rate in 60's.  FOLLOW UP PLANS AND APPOINTMENTS Allergies  Allergen Reactions  . Codeine Other (See Comments)    Nightmares  . Doxycycline Hives    mild     Medication List    TAKE these medications        albuterol 108 (90 Base) MCG/ACT inhaler  Commonly known as:  PROAIR HFA  Inhale 2 puffs into the lungs every 6 (six) hours as needed for wheezing or shortness of breath.  Notes to Patient:  Breathing      aspirin 325 MG EC tablet  Take 1 tablet (325 mg total) by mouth daily.  Notes to Patient:  Heart      atorvastatin 20 MG tablet  Commonly known as:  LIPITOR  Take 1 tablet (20 mg total) by mouth daily at 6 PM.  Notes to Patient:  Cholesterol      carvedilol 3.125 MG tablet  Commonly known as:  COREG  Take 0.5 tablets (1.5625 mg total) by mouth 2 (two) times daily with a meal.  Notes to Patient:  Decreases work of the heart, heart rate and blood pressure     diclofenac sodium 1 % Gel  Commonly known as:  VOLTAREN  Apply topically 4 (four) times daily.  Notes to Patient:  Pain      famotidine 20 MG tablet  Commonly known as:  PEPCID  Take 20 mg by mouth 2 (two) times daily.  Notes to Patient:  Heartburn/reflux     fluticasone-salmeterol 115-21 MCG/ACT inhaler  Commonly known as:  ADVAIR HFA  Inhale 2 puffs  into the lungs 2 (two) times daily.  Notes to Patient:  Breathing      HYDROcodone-acetaminophen 5-325 MG tablet  Commonly known as:  NORCO/VICODIN  Take 1 tablet by mouth every 8 (eight) hours as needed for severe pain.  Notes to Patient:  Pain      levalbuterol 45 MCG/ACT inhaler  Commonly known as:  XOPENEX HFA  Inhale 1-2 puffs into the lungs every 6 (six) hours as needed for wheezing.  Notes to Patient:  Breathing      lisinopril 10 MG tablet  Commonly known as:  PRINIVIL,ZESTRIL  Take 1 tablet (10 mg total) by mouth daily.  Notes to Patient:  Blood pressure     ondansetron 4 MG tablet  Commonly known as:  ZOFRAN  Take 1 tablet (4 mg total) by mouth every 8 (eight) hours as needed for nausea or vomiting.  Notes  to Patient:  Nausea      vitamin B-12 1000 MCG tablet  Commonly known as:  CYANOCOBALAMIN  Take 1 tablet (1,000 mcg total) by mouth daily.  Notes to Patient:  Supplement          Follow-up Information    Follow up with Richardson Dopp, PA-C On 07/25/2015.   Specialties:  Physician Assistant, Radiology, Interventional Cardiology   Why:  Cardiology Hospital Follow-Up on 07/25/2015 at 8:30AM   Contact information:   [id number] [phone]       BRING ALL MEDICATIONS WITH YOU TO FOLLOW UP APPOINTMENTS  Time spent with patient to include physician time: 40 minutes Signed: Erma Heritage, PA 07/11/2015, 9:54 AM Co-Sign MD   I have examined the patient and reviewed assessment and plan and discussed with patient. Agree with above as stated. He had no angina prior to CABG. He now has DOE after CABG. No target for PCI. I think he should try to increase exercise with cardiac rehab. He does not think it will help but he is willing to try. Blood pressure has been labile. Currently stable. He may need a sliding scale on his lisinopril. Right groin stable. No hematoma. 2+ right posterior tibial pulse. Follow-up with Dr.  [name].  VARANASI,JAYADEEP S.

## 2015-07-22 ENCOUNTER — Ambulatory Visit: Payer: BLUE CROSS/BLUE SHIELD | Admitting: Cardiovascular Disease

## 2015-07-24 NOTE — Progress Notes (Signed)
Cardiology Office Note    Date:  07/25/2015   ID:  CHINMAY DOTHARD, DOB [birth year], MRN [id number]  PCP:  [name], MD  Cardiologist:  Dr. [name]   Electrophysiologist:  n/a  Chief Complaint  Patient presents with  . Hospitalization Follow-up    s/p cardiac cath  . Coronary Artery Disease    History of Present Illness:  Carl Lee is a 60 y.o. male with a hx of CAD status post CABG, recurrent bladder CA, mild carotid stenosis followed by VVS, AAA s/p endovascular repair (9/13 Dr. [name]), tobacco abuse, HL, HTN, OSA, DM2. He was evaluated for pre-op clearance with a stress myoview in July 2015. This was high risk with large scar burden and LHC demonstrated 3 v CAD. He underwent CABG with Dr. [name] (L-LAD, RIMA-OM, S-PDA).   The patient recently complained of chest pain. Nuclear stress test was abnormal with scar and peri-infarct ischemia. LHC was arranged for 07/10/15. This demonstrated severe native 3 vessel CAD with patent LIMA-LAD and patent free RIMA-OM3. Native RCA was occluded and filled distally from left to right collaterals. SVG-RCA was occluded. Medical therapy was recommended.  Of note, the patient complained of severe headaches. These worsened after cardiac catheterization with associated nausea and vomiting. Head CT was unremarkable. Follow-up with PCP regarding headaches was recommended. Blood pressure was quite labile (88/46 >> 201/116). Nitrates were not added secondary to hypotension and headaches.  He returns for follow-up. Patient continues to complain of fatigue and chronic chest pain. He notes chest discomfort since his surgery. It is tender to touch. He also knows exertional chest discomfort and dyspnea over the past 3-4 months. He denies orthopnea, PND or edema. Sleeps with CPAP. Denies syncope. His blood pressure remains labile. He has not really had any significant hypotension at home. But he does have several episodes of blood pressures in the  140s to 160s over 90s.   Past Medical History  Diagnosis Date  . Bladder cancer ([place]) 08/16/2008  . Hypertension   . AAA (abdominal aortic aneurysm) ([place]) 03/25/12    s/p endovascular repair  . Hx of migraines   . Contact dermatitis     atypical [name])  . Childhood asthma   . Environmental allergies     improved as ages  . Diverticulosis 2013    severe by CT and colonoscopy  . Adenomatous polyp 06/2009  . Lumbosacral radiculopathy at S1 03/2012    left, with spinal stenosis (MRI 04/2013) improved with TF ESI L5/S1 and S1/2 ([facility] [place])  . Spinal stenosis     LS1 nerve root impingement from bulging disc  . Coronary artery disease     a.2015: CABG w/ LIMA-LAD, RIMA-OM, SVG-RCA b. Cath on 07/10/15 w/ patent LIMA-LAD and RIMA-OM. SVG-RCA occluded but collaterals present  . Zenker diverticulum   . Resistance to clopidogrel 2015    drug metabolism panel run - asked to scan  . OSA (obstructive sleep apnea) 09/04/2014    Severe with AHI 35/hr  . Vitamin B12 deficiency   . Diabetes mellitus type 2, controlled, with complications ([place]) [id number]    CAD   . COPD (chronic obstructive pulmonary disease) ([place])   . Diabetes mellitus without complication ([place])     Type II  . Fracture of lateral malleolus of left ankle 08/29/2013  . Hepatic steatosis 06/2015    by [place]    Past Surgical History  Procedure Laterality Date  . Tonsillectomy and adenoidectomy  1973  . Vasectomy    .  Bladder cancer  March 2010 and Oct. 2015    [facility] Spell) x 2  . Abdominal aortic aneurysm repair  03/25/12    endovascular  . Colonoscopy  06/2012    hyperplastic polyp, diverticulosis ([place]) rec rpt 5 yrs  . Cystoscopy  08/16/08    Bladder Cancer  . Esi  04/2013, 06/2013    L L5S1, S12 transforaminal ESI (Dr.  [name])  . Abi  05/2013    WNL, L TBI low at 0.66  . Coronary artery bypass graft N/A 02/07/2014    Procedure: CORONARY ARTERY BYPASS GRAFTING (CABG);  Surgeon: [name], MD;   Location: [facility];  Service: Open Heart Surgery;  Laterality: N/A;  CABG X 3, BILATERAL LIMA, EVH  . Intraoperative transesophageal echocardiogram N/A 02/07/2014    Procedure: INTRAOPERATIVE TRANSESOPHAGEAL ECHOCARDIOGRAM;  Surgeon: [name], MD;  Location: [facility];  Service: Open Heart Surgery;  Laterality: N/A;  . Esi Left 04/2014, 05/2014, 06/2014    L5/S1, S1/2; rpt; L4/5  . Left heart catheterization with coronary angiogram N/A 02/01/2014    Procedure: LEFT HEART CATHETERIZATION WITH CORONARY ANGIOGRAM;  Surgeon: [name], MD;  Location: [facility] CATH LAB;  Service: Cardiovascular;  Laterality: N/A;  . Epidural block injection Left 09/2014, 10/2014, 12/2014    medial L2,3,4, dorsal L5 ramus blocks x2, L L5/S1 and S1/2 transforaminal ESI ([facility] [place])  . Cardiac catheterization N/A 07/10/2015    Procedure: Left Heart Cath and Cors/Grafts Angiography;  Surgeon: [name], MD;  Location: [facility] CV LAB;  Service: Cardiovascular;  Laterality: N/A;     Outpatient Prescriptions Prior to Visit  Medication Sig Dispense Refill  . albuterol (PROAIR HFA) 108 (90 BASE) MCG/ACT inhaler Inhale 2 puffs into the lungs every 6 (six) hours as needed for wheezing or shortness of breath. 1 Inhaler 2  . aspirin EC 325 MG EC tablet Take 1 tablet (325 mg total) by mouth daily.    [name] atorvastatin (LIPITOR) 20 MG tablet Take 1 tablet (20 mg total) by mouth daily at 6 PM. 90 tablet 3  . diclofenac sodium (VOLTAREN) 1 % GEL Apply topically 4 (four) times daily.    . famotidine (PEPCID) 20 MG tablet Take 20 mg by mouth 2 (two) times daily.  3  . fluticasone-salmeterol (ADVAIR HFA) 115-21 MCG/ACT inhaler Inhale 2 puffs into the lungs 2 (two) times daily.    [name] HYDROcodone-acetaminophen (NORCO/VICODIN) 5-325 MG tablet Take 1 tablet by mouth every 8 (eight) hours as needed for severe pain. 30 tablet 0  . levalbuterol (XOPENEX HFA) 45 MCG/ACT inhaler Inhale 1-2 puffs into the lungs every 6  (six) hours as needed for wheezing. 1 Inhaler 12  . ondansetron (ZOFRAN) 4 MG tablet Take 1 tablet (4 mg total) by mouth every 8 (eight) hours as needed for nausea or vomiting. 20 tablet 0  . vitamin B-12 (CYANOCOBALAMIN) 1000 MCG tablet Take 1 tablet (1,000 mcg total) by mouth daily.    . carvedilol (COREG) 3.125 MG tablet Take 0.5 tablets (1.5625 mg total) by mouth 2 (two) times daily with a meal. 60 tablet 3  . lisinopril (PRINIVIL,ZESTRIL) 10 MG tablet Take 1 tablet (10 mg total) by mouth daily. 90 tablet 3   No facility-administered medications prior to visit.     Allergies:   Codeine and Doxycycline   Social History   Social History  . Marital Status: Married    Spouse Name: N/A  . Number of Children: 2  . Years of Education:  N/A   Occupational History  . Self Employed    Social History Main Topics  . Smoking status: Former Smoker -- 1.00 packs/day for 40 years    Types: Cigarettes    Quit date: 02/05/2014  . Smokeless tobacco: Never Used  . Alcohol Use: 0.0 oz/week    0 Standard drinks or equivalent per week     Comment: Rare- states 1 oz. per week  . Drug Use: No  . Sexual Activity: Not Asked   Other Topics Concern  . None   Social History Narrative   Caffeine: 1 cup coffee/day   Lives with wife, 1 dog   grown children   Occupation: Therapist, sports - self employed.  Not working, applied for disability.    Edu: 2 yrs college   Activity: fishing, walking occasionally   Diet: good water, fruits/vegetables daily     Family History:  The patient's family history includes Arrhythmia in his mother; CAD (age of onset: 5) in his father; COPD in his mother; Cancer in his mother; Dementia in his father; Diabetes in his brother, father, mother, and sister; Heart attack in his father and maternal grandmother; Hyperlipidemia in his mother; Hypertension in his brother and mother; Thyroid disease in his mother. There is no history of Colon cancer.   ROS:   Please see  the history of present illness.    Review of Systems  Constitution: Positive for diaphoresis and malaise/fatigue.  HENT: Positive for headaches.   Cardiovascular: Positive for chest pain and dyspnea on exertion.  Hematologic/Lymphatic: Bruises/bleeds easily.  Skin: Positive for rash.  Musculoskeletal: Positive for back pain.  Neurological: Positive for dizziness.  All other systems reviewed and are negative.   PHYSICAL EXAM:   VS:  BP 132/84 mmHg  Pulse 62  Ht 5\' 10"  (1.778 m)  Wt 214 lb (97.07 kg)  BMI 30.71 kg/m2   GEN: Well nourished, well developed, in no acute distress HEENT: normal Neck: no JVD, no masses Cardiac: Normal S1/S2, RRR; no murmurs; R groin without hematoma or bruit  Chest: Diffuse substernal left-sided tenderness to palpation Respiratory:  Decreased breath sounds bilaterally; no wheezing, rhonchi or rales GI: soft, nontender, nondistended, + BS MS: no deformity or atrophy Skin: warm and dry, no rash Neuro:  Bilateral strength equal, no focal deficits  Psych: Alert and oriented x 3, normal affect  Wt Readings from Last 3 Encounters:  07/25/15 214 lb (97.07 kg)  07/11/15 213 lb 6.5 oz (96.8 kg)  07/09/15 213 lb 12.8 oz (96.979 kg)      Studies/Labs Reviewed:   EKG:  EKG is  ordered today.  The ekg ordered today demonstrates NSR, HR 63, normal axis, nonspecific ST-T wave changes, QTc 401 ms, no change when compared to prior tracing  Recent Labs: 11/29/2014: ALT 7; TSH 1.50 07/09/2015: BUN 20; Creat 0.83; Potassium 4.0; Sodium 140 07/11/2015: Hemoglobin 15.9; Platelets 232   Recent Lipid Panel    Component Value Date/Time   CHOL 97 06/20/2015 0910   CHOL 170 01/05/2012   TRIG 83.0 06/20/2015 0910   TRIG 141 01/05/2012   HDL 34.20* 06/20/2015 0910   CHOLHDL 3 06/20/2015 0910   VLDL 16.6 06/20/2015 0910   LDLCALC 46 06/20/2015 0910   LDLDIRECT 105 01/05/2012    Additional studies/ records that were reviewed today include:   Ct Head Wo Contrast   07/10/2015  IMPRESSION: No acute intracranial abnormality. Electronically Signed   By: Rolm Baptise M.D.   On: 07/10/2015 16:10  LHC 07/10/15 LAD 100% LCx ostial 60%, OM1 40%, OM3 40% RCA 100% - dist RCA fills form L-R collats S-RCA 100% L-LAD ok Free RIMA-OM3 ok EF normal Med Rx  Myoview 06/25/15 Anterior, anteroseptal, apical scar with peri-infarct ischemia, EF 60%, intermediate risk  Abd/Pelvic CTA 11/15 Patent bilateral renal arteries.  LHC (7/15):  pLAD 30 then 100 (L-L collats), pCFX 80, mCFX 50, pRCA 100 (R-R and L-R collats), EF 50%, ant HK and poss LV apical aneurysm  Nuclear (7/15): Inferior and anteroseptal, mid and dist anterior and dist inferior and apical fixed defect, no ischemia, EF 49%; High Risk  Carotid US (7/15):  Bilateral ICA 1-39%    ASSESSMENT:    1. Atherosclerosis of native coronary artery of native heart with stable angina pectoris ([place])   2. Other chest pain   3. Essential hypertension   4. Dyslipidemia   5. History of repair of aneurysm of abdominal aorta using endovascular stent graft   6. Hx of migraines     PLAN:  In order of problems listed above:  1. CAD - s/p CABG.  Recent LHC done for chest pain and abnormal nuclear test with 2/3 grafts patent, CTO of RCA and occluded S-RCA.  Distal RCA filled by L-R collaterals and med Rx was recommended.  Nitrates were not added during his admit b/c of labile BPs and severe HA.  He continues to have chest pain, fatigue, DOE. He seems to have MSK chest pain that has persisted since his surgery. I question if some of his exertional symptoms are related to his occluded RCA with L-R collaterals.  I will try to advance anti-anginal Rx to see how he responds.  He is likely intolerant to beta-blocker given his chronic fatigue. I am not convinced he will be able to continue on beta-blockers long term.  I will try to change drugs to see if he can tolerate it better.  With his HAs and labile BPs, I do not  think he can tolerate nitrates.  Also with his labile BP, I am not certain he would be able to take a calcium channel blocker at this point.   -  DC Coreg  -  Start Bystolic 2.5 mg QPM  -  Start Ranexa 500 mg bid   -  Continue ASA, statin.  -  He will call in 2 weeks to let us know how Ranexa is helping. O/w keep FU with Dr. [name] in March.  2. Chest pain - As noted, he has some MSK component.  Adjust antianginals as noted.  I have also asked him to FU with his PCP to see if an SSRI or gabapentin could help.  Also, if his chest symptoms do not improve with Ranexa, I would ask him to FU with Pulmonology to see if any of his chest wall pain could be related to his COPD or his COPD therapy.   3. HTN -  As noted, remains labile.  CT in 2015 with patent renal arteries.  Consider 24 Hr urine for catecholamines/metanephrines if BP issues continue. If we are forced to DC his beta-blocker, consider increasing Lisinopril to 20 mg QD (this is what he used to take prior to CABG with well controlled BP).    4.. HL - Continue statin.   5. AAA s/p Endovascular repair - FU with VVS.     6. Hx of Migraine HAs - FU with PCP.      Medication Adjustments/Labs and Tests Ordered: Current medicines are  reviewed at length with the patient today.  Concerns regarding medicines are outlined above.  Medication changes, Labs and Tests ordered today are outlined in the Patient Instructions noted below.   Signed, Richardson Dopp, PA-C  07/25/2015 2:47 PM    [address], [place], [place]  [address] Phone: [phone]; Fax: [phone]    Patient Instructions  Medication Instructions:  1. STOP COREG  2. START BYSTOLIC 2.5 MG 1 TABLET EVERY EVENING  3. START TAKING LISINOPRIL IN THE MORNING; MAKE SURE THE LISINOPRIL AND BYSTOLIC ARE SPACED OUT 12 HOURS APART  4. START RANEXA 500 MG 1 TABLET TWICE  DAILY  Labwork: NONE  Testing/Procedures: NONE  Follow-Up: KEEP DR. [name] APPT 09/2015  Any Other Special Instructions Will Be Listed Below (If Applicable). CALL IN ABOUT 2 WEEKS TO LET us KNOW HOW THE RANEXA IS DOING FOR YOU [phone]  IF BP IS ABOVE 160/95 THEN OK TO TAKE EXTRA LISINOPRIL  If you need a refill on your cardiac medications before your next appointment, please call your pharmacy.

## 2015-07-25 ENCOUNTER — Encounter: Payer: Self-pay | Admitting: Physician Assistant

## 2015-07-25 ENCOUNTER — Telehealth: Payer: Self-pay

## 2015-07-25 ENCOUNTER — Ambulatory Visit (INDEPENDENT_AMBULATORY_CARE_PROVIDER_SITE_OTHER): Payer: BLUE CROSS/BLUE SHIELD | Admitting: Physician Assistant

## 2015-07-25 VITALS — BP 132/84 | HR 62 | Ht 70.0 in | Wt 214.0 lb

## 2015-07-25 DIAGNOSIS — E785 Hyperlipidemia, unspecified: Secondary | ICD-10-CM

## 2015-07-25 DIAGNOSIS — I10 Essential (primary) hypertension: Secondary | ICD-10-CM

## 2015-07-25 DIAGNOSIS — R0789 Other chest pain: Secondary | ICD-10-CM

## 2015-07-25 DIAGNOSIS — Z95828 Presence of other vascular implants and grafts: Secondary | ICD-10-CM

## 2015-07-25 DIAGNOSIS — Z8669 Personal history of other diseases of the nervous system and sense organs: Secondary | ICD-10-CM

## 2015-07-25 DIAGNOSIS — I25118 Atherosclerotic heart disease of native coronary artery with other forms of angina pectoris: Secondary | ICD-10-CM | POA: Diagnosis not present

## 2015-07-25 MED ORDER — RANOLAZINE ER 500 MG PO TB12: 500.0000 mg | ORAL_TABLET | Freq: Two times a day (BID) | ORAL | Status: DC

## 2015-07-25 MED ORDER — LISINOPRIL 10 MG PO TABS: 10.0000 mg | ORAL_TABLET | Freq: Every day | ORAL | Status: DC

## 2015-07-25 MED ORDER — NEBIVOLOL HCL 2.5 MG PO TABS: 2.5000 mg | ORAL_TABLET | Freq: Every evening | ORAL | Status: DC

## 2015-07-25 NOTE — Patient Instructions (Addendum)
Medication Instructions:  1. STOP COREG  2. START BYSTOLIC 2.5 MG 1 TABLET EVERY EVENING  3. START TAKING LISINOPRIL IN THE MORNING; MAKE SURE THE LISINOPRIL AND BYSTOLIC ARE SPACED OUT 12 HOURS APART  4. START RANEXA 500 MG 1 TABLET TWICE DAILY  Labwork: NONE  Testing/Procedures: NONE  Follow-Up: KEEP DR. [name] APPT 09/2015  Any Other Special Instructions Will Be Listed Below (If Applicable). CALL IN ABOUT 2 WEEKS TO LET us KNOW HOW THE RANEXA IS DOING FOR YOU [phone]  IF BP IS ABOVE 160/95 THEN OK TO TAKE EXTRA LISINOPRIL  If you need a refill on your cardiac medications before your next appointment, please call your pharmacy.

## 2015-07-25 NOTE — Telephone Encounter (Signed)
Prior auths submitted for both Ranexa 500mg  ER and Bystolic 2.5mg  to [facility].

## 2015-07-28 ENCOUNTER — Telehealth: Payer: Self-pay | Admitting: Physician Assistant

## 2015-07-28 DIAGNOSIS — R5383 Other fatigue: Secondary | ICD-10-CM

## 2015-07-28 NOTE — Telephone Encounter (Signed)
Please tell patient I reviewed his case with Dr. Servando Snare. We reviewed his persistent symptoms since his surgery and the recent warnings from the FDA regarding some cardiopulmonary bypass machines used during CABG being linked to non-tuberculous mycobacterial infections.  The patient had received a letter about this and asked me at his last visit with me. After discussion with Dr. Servando Snare, the likelihood that he has an indolent infection is quite low. But, I would like to refer him to Infectious Disease.  They can review his case and order the appropriate tests to make sure that this is completely ruled out. Please refer to ID. Richardson Dopp, PA-C   07/28/2015 2:30 PM

## 2015-07-29 NOTE — Addendum Note (Signed)
Addended by: Michae Kava on: 07/29/2015 05:48 PM   Modules accepted: Orders

## 2015-07-29 NOTE — Telephone Encounter (Signed)
I s/w pt and advised pt per Dr. Servando Snare and Brynda Rim. PA to refer pt to Infectious Disease to r/o mycobacterium chimaera from when he had his CABG. Pt agreeable to plan of car. Also have samples of Ranexa for pt. Pt also stated that his insurance company does not cover the [employer]. I advised pt that I will let Richardson Dopp, PA know of this. Pt may not be able to tolerate Bystolic. Pt said thank you for my time and getting him samples of the Ranexa. Left samples at the front desk for pt pick up.

## 2015-07-31 ENCOUNTER — Telehealth: Payer: Self-pay

## 2015-07-31 NOTE — Telephone Encounter (Signed)
Ranexa approved

## 2015-08-04 ENCOUNTER — Telehealth: Payer: Self-pay | Admitting: Cardiology

## 2015-08-04 NOTE — Telephone Encounter (Signed)
New message      Talk to the sleep nurse.  Calling to see if he needs to change the settings on his cpap machine?  Please call

## 2015-08-04 NOTE — Telephone Encounter (Signed)
As far as pt's message about Infectious disease appt, I sent a message to Carl Lee to look into appt being scheduled for pt. The other message looks like it is for [facility], in regards to a CPAP question.

## 2015-08-04 NOTE — Telephone Encounter (Signed)
This is in regards to a 2 week auto titration.. I am calling [facility] to have them fax over the report so that it can be scanned to Dr. Radford Pax.  Patient is aware that I will call him back when I have those results.

## 2015-08-04 NOTE — Telephone Encounter (Signed)
I called pt back to inform per [facility] the referral to Infectious Disease. Waiting on dr approval.

## 2015-08-04 NOTE — Telephone Encounter (Signed)
Follow up      Calling to let Carl Lee know that he has not received an appt to the disease center office.  He was referred by Carl Lee and no one has called to set up appt

## 2015-08-05 ENCOUNTER — Encounter: Payer: Self-pay | Admitting: Cardiology

## 2015-08-12 ENCOUNTER — Telehealth: Payer: Self-pay | Admitting: *Deleted

## 2015-08-12 NOTE — Telephone Encounter (Signed)
-----   Message from Sueanne Margarita, MD sent at 08/12/2015  2:42 PM EST ----- 12cm H2O is too low based on autotitration.  Set CPAP at 17cm H2O and get a d/l in 2 weeks

## 2015-08-12 NOTE — Telephone Encounter (Signed)
Pressure has been changed to 17cm H2O

## 2015-08-13 ENCOUNTER — Telehealth: Payer: Self-pay | Admitting: Cardiology

## 2015-08-13 DIAGNOSIS — G4733 Obstructive sleep apnea (adult) (pediatric): Secondary | ICD-10-CM

## 2015-08-13 NOTE — Telephone Encounter (Signed)
Ok to decrease CPAP to 15cm H2O and get d/l in 2 weeks

## 2015-08-13 NOTE — Telephone Encounter (Signed)
Patient st his CPAP pressure was increased to 17 cm yesterday. He tried all night to sleep, but the pressure was so high he couldn't keep the mask sealed. He requests to decrease the pressure to 15 and try that.  To Dr. Radford Pax.

## 2015-08-13 NOTE — Telephone Encounter (Signed)
Pt had C pap adjusted yesterday,he says it needs to be adjusted again.

## 2015-08-13 NOTE — Telephone Encounter (Signed)
Informed patient order is being placed to reduce pressure to 15 cm H2O. Patient grateful for call and [facility] notified.

## 2015-08-27 ENCOUNTER — Ambulatory Visit (INDEPENDENT_AMBULATORY_CARE_PROVIDER_SITE_OTHER): Payer: BLUE CROSS/BLUE SHIELD | Admitting: Infectious Diseases

## 2015-08-27 ENCOUNTER — Encounter: Payer: Self-pay | Admitting: Infectious Diseases

## 2015-08-27 DIAGNOSIS — I25709 Atherosclerosis of coronary artery bypass graft(s), unspecified, with unspecified angina pectoris: Secondary | ICD-10-CM | POA: Diagnosis not present

## 2015-08-27 LAB — C-REACTIVE PROTEIN: CRP: 1.7 mg/dL — ABNORMAL HIGH (ref <0.60)

## 2015-08-27 LAB — SEDIMENTATION RATE: SED RATE: 1 mm/h (ref 0–20)

## 2015-08-27 LAB — CBC
HEMATOCRIT: 45.6 % (ref 39.0–52.0)
HEMOGLOBIN: 16.2 g/dL (ref 13.0–17.0)
MCH: 32.4 pg (ref 26.0–34.0)
MCHC: 35.5 g/dL (ref 30.0–36.0)
MCV: 91.2 fL (ref 78.0–100.0)
MPV: 10 fL (ref 8.6–12.4)
Platelets: 224 10*3/uL (ref 150–400)
RBC: 5 MIL/uL (ref 4.22–5.81)
RDW: 13.6 % (ref 11.5–15.5)
WBC: 8 10*3/uL (ref 4.0–10.5)

## 2015-08-27 LAB — HEPATITIS C ANTIBODY: HCV Ab: NEGATIVE

## 2015-08-27 NOTE — Progress Notes (Signed)
   Subjective:    Patient ID: Carl Lee, male    DOB: [birth year], 60 y.o.   MRN: [id number]  HPI 60 y.o. male with past medical history of CAD (s/p CABG in 2015 with LIMA-LAD, RIMA-OM, and SVG-PDA), AAA(s/p repair in 2013), OSA (on CPAP), Type 2 DM/pre-diabets,  bladder Ca (x2), Ao aneurysm repair/stent, and HTN who presented to [facility] on 07/10/2015 for cardiac catheterization.  The patient was seen by Dr. Radford Pax for OSA in 06/2015 and reported having chest pain with exertion. Due to possible unstable angina, a NST was recommended which showed peri-infarct ischemia. His catheterization showed patent LIMA-LAD and patent RIMA-OM. The SVG-RCA was occluded but the distal RCA filled from left to right collaterals. Continued medical management was recommended.   The patient received a letter regarding possible Mycobacteria exposure from his CABG in 2015.  He is referred here for eval.  He states since surgery he has had no energy. Feels like he has to take a nap every day, bruises easy, chest hurts all the time.  Had SOB prev but his rx was changed and this improved.  He also was having issues with BP control , medication changed.  His prev constant chest pressure has resolved, his still has discomfort.  Has been cleared by his CV and CCM doctors to return to full activity.   PMHx/Medications/Soc/Fhx reviewed in EPIC  Review of Systems  Constitutional: Positive for fatigue. Negative for fever, chills and unexpected weight change.  Respiratory: Positive for cough and shortness of breath.   Cardiovascular: Positive for chest pain.  Gastrointestinal: Negative for diarrhea and constipation.  Genitourinary: Negative for difficulty urinating.  Neurological: Positive for headaches.  has had thrush x 3. Has had strep throat. Feels like he catches everything.  No problems with wound healing of his chest. He had RLE wound weeping for several months after surgery, has since resolved. Still has  soreness.      Objective:   Physical Exam  Constitutional: He appears well-developed and well-nourished.  HENT:  Mouth/Throat: No oropharyngeal exudate.  Eyes: EOM are normal. Pupils are equal, round, and reactive to light.  Neck: Neck supple.  Cardiovascular: Normal rate, regular rhythm and normal heart sounds.   Pulmonary/Chest: Effort normal and breath sounds normal.    Abdominal: Soft. Bowel sounds are normal. There is no tenderness. There is no rebound.  Musculoskeletal: He exhibits no edema.       Legs: Lymphadenopathy:    He has no cervical adenopathy.      Assessment & Plan:

## 2015-08-27 NOTE — Assessment & Plan Note (Signed)
He is doing well but I have some concern that some of his sx may be related to his medications (fatigue from beta-blocker, bruising from ASA). He does not have many sx that would raise the concern for Mycobateria infection- he does not have fever, his wound is intact, no change in wt. Given his continued chest pain and mild point tenderness, will check his BCx and check a CT of his chest. Would suggest he is far enough from surgery to have resolved post-surgical inflamation.  Will also check BCx for AFB, hepatitis C (at his request) Will see him back after CT scan is done.

## 2015-09-03 ENCOUNTER — Telehealth: Payer: Self-pay | Admitting: Physician Assistant

## 2015-09-03 DIAGNOSIS — I25118 Atherosclerotic heart disease of native coronary artery with other forms of angina pectoris: Secondary | ICD-10-CM

## 2015-09-03 NOTE — Telephone Encounter (Signed)
Patient was told to call in two weeks and let Richardson Dopp PA know how he is doing. Patient's blood pressure has improved the highest which is not often is 135/90 and usually runs 110/80-70, HR 50-60. Patient stated he has to take naps during the day because he gets so tired. Patient stated his SOB is a lot better, but still have it when he runs. His chest still hurts now and then, but no pressure. On a pain scale 3-4, when it's sharp it's a 6. Patient states chest pain happens daily and only last for about 5 minutes. Patient stated he is usually not really doing anything, like sitting on the couch at night, feels like a cramp. Patient wants to know if he will have to stay on Ranexa  long term. Also he wants to know if this chest pain is ever going to go away. Will forward to [employer] PA.

## 2015-09-03 NOTE — Telephone Encounter (Signed)
It sounds like his pain is somewhat better. BP and HR look good. If he feels like his fatigue is still really bothering him, he should stop taking Bisoprolol to see if he feels better off of this drug. Also, I would recommend increasing his Ranexa to 1000 mg Twice daily to see if this helps his chest pain and SOB more. Keep FU with Dr. [name] in March. Richardson Dopp, PA-C   09/03/2015 5:52 PM

## 2015-09-03 NOTE — Telephone Encounter (Signed)
Pt calling to give you an update on his condition.

## 2015-09-05 MED ORDER — RANOLAZINE ER 1000 MG PO TB12: 1000.0000 mg | ORAL_TABLET | Freq: Two times a day (BID) | ORAL | Status: DC

## 2015-09-05 NOTE — Telephone Encounter (Signed)
Called patient with Richardson Dopp PA's instructions. Patient verbalized understanding. Ranexa sent to patient's pharmacy and he will follow-up with Dr. [name] in March.

## 2015-09-10 ENCOUNTER — Telehealth: Payer: Self-pay | Admitting: *Deleted

## 2015-09-10 NOTE — Telephone Encounter (Signed)
Patient left message in triage asking if the appointment for his CT has been made.  RN returned the call, let him know that the imaging has to have approval from his insurance first, that Kennyth Lose would call him once she has the information. Patient's return appointment with is 3/8.  He wants to know if he should reschedule this appointment to allow more time. Landis Gandy, RN

## 2015-09-12 NOTE — Telephone Encounter (Signed)
Called patient and he is concerned this has taken so long to schedule. Advised him that is needed a PA and the front office has been short staffed. Currently we do not have a referral specialist. I will check with Luanna Salk Steps this afternoon and offered to cancel on 09/16/14. He stated he would cancel himself if he does not hear back prior to this.

## 2015-09-16 ENCOUNTER — Encounter (HOSPITAL_COMMUNITY): Payer: Self-pay

## 2015-09-16 ENCOUNTER — Ambulatory Visit (HOSPITAL_COMMUNITY)
Admission: RE | Admit: 2015-09-16 | Discharge: 2015-09-16 | Disposition: A | Discharge status: Home / Self Care | Payer: BLUE CROSS/BLUE SHIELD | Source: Ambulatory Visit | Attending: Infectious Diseases | Admitting: Infectious Diseases

## 2015-09-16 ENCOUNTER — Other Ambulatory Visit: Payer: Self-pay | Admitting: Infectious Diseases

## 2015-09-16 DIAGNOSIS — C679 Malignant neoplasm of bladder, unspecified: Secondary | ICD-10-CM

## 2015-09-16 DIAGNOSIS — I25709 Atherosclerosis of coronary artery bypass graft(s), unspecified, with unspecified angina pectoris: Secondary | ICD-10-CM | POA: Insufficient documentation

## 2015-09-16 DIAGNOSIS — R0789 Other chest pain: Secondary | ICD-10-CM | POA: Insufficient documentation

## 2015-09-16 LAB — POCT I-STAT CREATININE: Creatinine, Ser: 0.9 mg/dL (ref 0.61–1.24)

## 2015-09-16 MED ORDER — IOHEXOL 300 MG/ML  SOLN
75.0000 mL | Freq: Once | INTRAMUSCULAR | Status: AC | PRN
  Administered 2015-09-16: 75 mL via INTRAVENOUS

## 2015-09-17 ENCOUNTER — Ambulatory Visit (INDEPENDENT_AMBULATORY_CARE_PROVIDER_SITE_OTHER): Payer: BLUE CROSS/BLUE SHIELD | Admitting: Infectious Diseases

## 2015-09-17 ENCOUNTER — Other Ambulatory Visit: Payer: Self-pay | Admitting: Family Medicine

## 2015-09-17 ENCOUNTER — Encounter: Payer: Self-pay | Admitting: Infectious Diseases

## 2015-09-17 VITALS — BP 122/77 | HR 51 | Temp 97.6°F | Ht 70.0 in | Wt 211.0 lb

## 2015-09-17 DIAGNOSIS — R079 Chest pain, unspecified: Secondary | ICD-10-CM | POA: Diagnosis not present

## 2015-09-17 NOTE — Assessment & Plan Note (Signed)
His CT scan does not suggest osteo of sternum His AFB BCx is ngtd.  I re-assured him that I do not believe he has Mycobacterial infection related to his CV surgery.  His labs are unremarkable with the exception of an elevated CRP. The cause of this is unclear.  He seems to have exertional, musculoskeletal pain. He also has an element of relief with NTG.  He will f/u with CV.  rtc prn

## 2015-09-17 NOTE — Progress Notes (Signed)
   Subjective:    Patient ID: Carl Lee, male    DOB: January 04, 1956, 60 y.o.   MRN: [id number]  HPI 60 y.o. male with past medical history of CAD (s/p CABG in 2015 with LIMA-LAD, RIMA-OM, and SVG-PDA), AAA(s/p repair in 2013), OSA (on CPAP), Type 2 DM/pre-diabets, bladder Ca (x2), Ao aneurysm repair/stent, and HTN who presented to [facility] on 07/10/2015 for cardiac catheterization.  The patient was seen by Dr. Radford Pax for OSA in 06/2015 and reported having chest pain with exertion. Due to possible unstable angina, a NST was recommended which showed peri-infarct ischemia. His catheterization showed patent LIMA-LAD and patent RIMA-OM. The SVG-RCA was occluded but the distal RCA filled from left to right collaterals. Continued medical management was recommended.   The patient received a letter regarding possible Mycobacteria exposure from his CABG in 2015.   He was seen in ID prev for this. He had a CT scan to f/u-Status post median sternotomy, without acute findings or explanation for chest pain.  He still has chest pressure, worsened with exertion.  Has had low grade temp 1-2 x when he had a cold recently.   Review of Systems See HPI.     Objective:   Physical Exam  Constitutional: He appears well-developed and well-nourished.  Cardiovascular: Normal rate, regular rhythm and normal heart sounds.   Pulmonary/Chest: Effort normal and breath sounds normal.    Abdominal: Soft. Bowel sounds are normal. There is no tenderness. There is no rebound.      Assessment & Plan:

## 2015-10-07 NOTE — Progress Notes (Signed)
Patient ID: Carl Lee, male   DOB: August 01, 1955, 60 y.o.   MRN: [id number]    Cardiology Office Note    Date:  10/10/2015   ID:  Carl Lee, DOB Jan 09, 1956, MRN [id number]  PCP:  [name], MD  Cardiologist:  Dr. [name]   Electrophysiologist:  n/a  Chief Complaint  Patient presents with  . Coronary Artery Disease    pressure & soreness in chest area     History of Present Illness:  Carl Lee is a 60 y.o. male with a hx of CAD status post CABG, recurrent bladder CA, mild carotid stenosis followed by VVS, AAA s/p endovascular repair (9/13 Dr. [name]), tobacco abuse, HL, HTN, OSA, DM2. He was evaluated for pre-op clearance with a stress myoview in July 2015. This was high risk with large scar burden and LHC demonstrated 3 v CAD. He underwent CABG with Dr. [name] (L-LAD, RIMA-OM, S-PDA).   The patient  complained of chest pain. December 2016  Nuclear stress test was abnormal with scar and peri-infarct ischemia. LHC was arranged for 07/10/15. This demonstrated severe native 3 vessel CAD with patent LIMA-LAD and patent free RIMA-OM3. Native RCA was occluded and filled distally from left to right collaterals. SVG-RCA was occluded. Medical therapy was recommended.  Of note, the patient complained of severe headaches. These worsened after cardiac catheterization with associated nausea and vomiting. Head CT was unremarkable. Follow-up with PCP regarding headaches was recommended. Blood pressure was quite labile (88/46 >> 201/116). Nitrates were not added secondary to hypotension and headaches.  Seen by PA in January and doses of Ranexa and bystolic adjusted  Lots of complaints about malaise , fatigue, labile BP and HR Does not want to take beta blocker during day and bystilic too expensive.     Past Medical History  Diagnosis Date  . Bladder cancer ([place]) 08/16/2008  . Hypertension   . AAA (abdominal aortic aneurysm) ([place]) 03/25/12    s/p endovascular repair  . Hx of  migraines   . Contact dermatitis     atypical [name])  . Childhood asthma   . Environmental allergies     improved as ages  . Diverticulosis 2013    severe by CT and colonoscopy  . Adenomatous polyp 06/2009  . Lumbosacral radiculopathy at S1 03/2012    left, with spinal stenosis (MRI 04/2013) improved with TF ESI L5/S1 and S1/2 ([place] [place])  . Spinal stenosis     LS1 nerve root impingement from bulging disc  . Coronary artery disease     a.2015: CABG w/ LIMA-LAD, RIMA-OM, SVG-RCA b. Cath on 07/10/15 w/ patent LIMA-LAD and RIMA-OM. SVG-RCA occluded but collaterals present  . Zenker diverticulum   . Resistance to clopidogrel 2015    drug metabolism panel run - asked to scan  . OSA (obstructive sleep apnea) 09/04/2014    Severe with AHI 35/hr  . Vitamin B12 deficiency   . Diabetes mellitus type 2, controlled, with complications ([place]) [id number]    CAD   . COPD (chronic obstructive pulmonary disease) ([place])   . Diabetes mellitus without complication ([place])     Type II  . Fracture of lateral malleolus of left ankle 08/29/2013  . Hepatic steatosis 06/2015    by [place]    Past Surgical History  Procedure Laterality Date  . Tonsillectomy and adenoidectomy  1973  . Vasectomy    . Bladder cancer  March 2010 and Oct. 2015    [facility]ell) x 2  . Abdominal aortic  aneurysm repair  03/25/12    endovascular  . Colonoscopy  06/2012    hyperplastic polyp, diverticulosis ([place]) rec rpt 5 yrs  . Cystoscopy  08/16/08    Bladder Cancer  . Esi  04/2013, 06/2013    L L5S1, S12 transforaminal ESI (Dr.  [name])  . Abi  05/2013    WNL, L TBI low at 0.66  . Coronary artery bypass graft N/A 02/07/2014    Procedure: CORONARY ARTERY BYPASS GRAFTING (CABG);  Surgeon: [name], MD;  Location: [facility];  Service: Open Heart Surgery;  Laterality: N/A;  CABG X 3, BILATERAL LIMA, EVH  . Intraoperative transesophageal echocardiogram N/A 02/07/2014    Procedure: INTRAOPERATIVE TRANSESOPHAGEAL  ECHOCARDIOGRAM;  Surgeon: [name], MD;  Location: [place];  Service: Open Heart Surgery;  Laterality: N/A;  . Esi Left 04/2014, 05/2014, 06/2014    L5/S1, S1/2; rpt; L4/5  . Left heart catheterization with coronary angiogram N/A 02/01/2014    Procedure: LEFT HEART CATHETERIZATION WITH CORONARY ANGIOGRAM;  Surgeon: [name], MD;  Location: [facility] CATH LAB;  Service: Cardiovascular;  Laterality: N/A;  . Epidural block injection Left 09/2014, 10/2014, 12/2014    medial L2,3,4, dorsal L5 ramus blocks x2, L L5/S1 and S1/2 transforaminal ESI ([place] [place])  . Cardiac catheterization N/A 07/10/2015    Procedure: Left Heart Cath and Cors/Grafts Angiography;  Surgeon: [name], MD;  Location: [facility] CV LAB;  Service: Cardiovascular;  Laterality: N/A;     Outpatient Prescriptions Prior to Visit  Medication Sig Dispense Refill  . albuterol (PROAIR HFA) 108 (90 BASE) MCG/ACT inhaler Inhale 2 puffs into the lungs every 6 (six) hours as needed for wheezing or shortness of breath. 1 Inhaler 2  . aspirin EC 325 MG EC tablet Take 1 tablet (325 mg total) by mouth daily.    [name] atorvastatin (LIPITOR) 20 MG tablet TAKE 1 TABLET (20 MG TOTAL) BY MOUTH DAILY AT 6 PM. 90 tablet 3  . diclofenac sodium (VOLTAREN) 1 % GEL Apply topically 4 (four) times daily. Reported on 09/17/2015    . famotidine (PEPCID) 20 MG tablet Take 20 mg by mouth 2 (two) times daily.  3  . fluticasone-salmeterol (ADVAIR HFA) 115-21 MCG/ACT inhaler Inhale 2 puffs into the lungs 2 (two) times daily.    [name] HYDROcodone-acetaminophen (NORCO/VICODIN) 5-325 MG tablet Take 1 tablet by mouth every 8 (eight) hours as needed for severe pain. 30 tablet 0  . levalbuterol (XOPENEX HFA) 45 MCG/ACT inhaler Inhale 1-2 puffs into the lungs every 6 (six) hours as needed for wheezing. 1 Inhaler 12  . lisinopril (PRINIVIL,ZESTRIL) 10 MG tablet Take 1 tablet (10 mg total) by mouth daily with breakfast.    . ranolazine (RANEXA) 1000 MG  SR tablet Take 1 tablet (1,000 mg total) by mouth 2 (two) times daily. 60 tablet 11  . vitamin B-12 (CYANOCOBALAMIN) 1000 MCG tablet Take 1 tablet (1,000 mcg total) by mouth daily.    [name] BYSTOLIC 2.5 MG tablet Take 2.5 mg by mouth daily.   11  . carvedilol (COREG) 3.125 MG tablet Reported on 09/17/2015  3   No facility-administered medications prior to visit.     Allergies:   Codeine and Doxycycline   Social History   Social History  . Marital Status: Married    Spouse Name: N/A  . Number of Children: 2  . Years of Education: N/A   Occupational History  . Self Employed    Social History Main Topics  .  Smoking status: Former Smoker -- 1.00 packs/day for 40 years    Types: Cigarettes    Quit date: 02/05/2014  . Smokeless tobacco: Never Used  . Alcohol Use: 0.0 oz/week    0 Standard drinks or equivalent per week     Comment: Rare- states 1 oz. per week  . Drug Use: No  . Sexual Activity: Not Asked   Other Topics Concern  . None   Social History Narrative   Caffeine: 1 cup coffee/day   Lives with wife, 1 dog   grown children   Occupation: Therapist, sports - self employed.  Not working, applied for disability.    Edu: 2 yrs college   Activity: fishing, walking occasionally   Diet: good water, fruits/vegetables daily     Family History:  The patient's family history includes Arrhythmia in his mother; CAD (age of onset: 71) in his father; COPD in his mother; Cancer in his mother; Dementia in his father; Diabetes in his brother, father, mother, and sister; Heart attack in his father and maternal grandmother; Hyperlipidemia in his mother; Hypertension in his brother and mother; Thyroid disease in his mother. There is no history of Colon cancer.   ROS:   Please see the history of present illness.    Review of Systems  Constitution: Positive for diaphoresis and malaise/fatigue.  HENT: Positive for headaches.   Cardiovascular: Positive for chest pain and dyspnea on  exertion.  Hematologic/Lymphatic: Bruises/bleeds easily.  Skin: Positive for rash.  Musculoskeletal: Positive for back pain.  Neurological: Positive for dizziness.  All other systems reviewed and are negative.   PHYSICAL EXAM:   VS:  BP 110/70 mmHg  Pulse 89  Ht 5\' 10"  (1.778 m)  Wt 95.165 kg (209 lb 12.8 oz)  BMI 30.10 kg/m2  SpO2 94%   GEN: Well nourished, well developed, in no acute distress HEENT: normal Neck: no JVD, no masses Cardiac: Normal S1/S2, RRR; no murmurs; R groin without hematoma or bruit  Chest: Diffuse substernal left-sided tenderness to palpation Respiratory:  Decreased breath sounds bilaterally; no wheezing, rhonchi or rales GI: soft, nontender, nondistended, + BS MS: no deformity or atrophy Skin: warm and dry, no rash Neuro:  Bilateral strength equal, no focal deficits  Psych: Alert and oriented x 3, normal affect  Wt Readings from Last 3 Encounters:  10/10/15 95.165 kg (209 lb 12.8 oz)  09/17/15 95.709 kg (211 lb)  08/27/15 96.616 kg (213 lb)      Studies/Labs Reviewed:   EKG:   07/25/15 NSR, HR 63, normal axis, nonspecific ST-T wave changes, QTc 401 ms, no change when compared to prior tracing  Recent Labs: 11/29/2014: ALT 7; TSH 1.50 07/09/2015: BUN 20; Potassium 4.0; Sodium 140 08/27/2015: Hemoglobin 16.2; Platelets 224 09/16/2015: Creatinine, Ser 0.90   Recent Lipid Panel    Component Value Date/Time   CHOL 97 06/20/2015 0910   CHOL 170 01/05/2012   TRIG 83.0 06/20/2015 0910   TRIG 141 01/05/2012   HDL 34.20* 06/20/2015 0910   CHOLHDL 3 06/20/2015 0910   VLDL 16.6 06/20/2015 0910   LDLCALC 46 06/20/2015 0910   LDLDIRECT 105 01/05/2012    Additional studies/ records that were reviewed today include:   Ct Head Wo Contrast  07/10/2015  IMPRESSION: No acute intracranial abnormality. Electronically Signed   By: Rolm Baptise M.D.   On: 07/10/2015 16:10   LHC 07/10/15 LAD 100% LCx ostial 60%, OM1 40%, OM3 40% RCA 100% - dist RCA fills  form L-R collats S-RCA 100%  L-LAD ok Free RIMA-OM3 ok EF normal Med Rx  Myoview 06/25/15 Anterior, anteroseptal, apical scar with peri-infarct ischemia, EF 60%, intermediate risk  Abd/Pelvic CTA 11/15 Patent bilateral renal arteries.  LHC (7/15):  pLAD 30 then 100 (L-L collats), pCFX 80, mCFX 50, pRCA 100 (R-R and L-R collats), EF 50%, ant HK and poss LV apical aneurysm  Nuclear (7/15): Inferior and anteroseptal, mid and dist anterior and dist inferior and apical fixed defect, no ischemia, EF 49%; High Risk  Carotid US (7/15):  Bilateral ICA 1-39%    ASSESSMENT:    CAD   PLAN:  In order of problems listed above:  1. CAD - s/p CABG.  07/10/15  LHC done for chest pain and abnormal nuclear test with 2/3 grafts patent, CTO of RCA and occluded S-RCA.  Distal RCA filled by L-R collaterals and med Rx was recommended.  Nitrates were not added during his admit b/c of labile BPs and severe HA.   MSK chest pain that has persisted since his surgery. Tried to  Reassure him that these pains not cardiac   2. Chest pain - As noted, he has some MSK component.  Adjust antianginals as noted.  I have also asked him to FU with his PCP to see if an SSRI or gabapentin could help.  Also, if his chest symptoms do not improve with Ranexa, I would ask him to FU with Pulmonology to see if any of his chest wall pain could be related to his COPD or his COPD therapy.   3. HTN -  As noted, remains labile.  CT in 2015 with patent renal arteries.  24 hr BP monitor  D/c bystolic coreg [id number] at night only   4.. HL - Continue statin.   5. AAA s/p Endovascular repair - FU with VVS.     6. Hx of Migraine HAs - FU with PCP.      [name]

## 2015-10-10 ENCOUNTER — Ambulatory Visit (INDEPENDENT_AMBULATORY_CARE_PROVIDER_SITE_OTHER): Payer: BLUE CROSS/BLUE SHIELD | Admitting: Cardiovascular Disease

## 2015-10-10 ENCOUNTER — Encounter: Payer: Self-pay | Admitting: Cardiovascular Disease

## 2015-10-10 VITALS — BP 110/70 | HR 89 | Ht 70.0 in | Wt 209.8 lb

## 2015-10-10 DIAGNOSIS — I2510 Atherosclerotic heart disease of native coronary artery without angina pectoris: Secondary | ICD-10-CM

## 2015-10-10 MED ORDER — CARVEDILOL 6.25 MG PO TABS: 6.2500 mg | ORAL_TABLET | Freq: Two times a day (BID) | ORAL | Status: DC

## 2015-10-10 NOTE — Patient Instructions (Addendum)
Medication Instructions:  Your physician has recommended you make the following change in your medication:  1-STOP Bystolic 2-INCREASE Coreg 6.25 mg by mouth daily  Labwork: NONE  Testing/Procedures: Your physician has requested that you regularly monitor and record your 24 hour blood pressure readings at home. Please use the same machine at the same time of day to check your readings and record them to bring to your follow-up visit.  Follow-Up: Your physician wants you to follow-up in: 6 months with Dr. [name]. You will receive a reminder letter in the mail two months in advance. If you don't receive a letter, please call our office to schedule the follow-up appointment.  If you need a refill on your cardiac medications before your next appointment, please call your pharmacy.

## 2015-10-12 LAB — AFB CULTURE, BLOOD

## 2015-10-22 ENCOUNTER — Other Ambulatory Visit: Payer: Self-pay | Admitting: Cardiovascular Disease

## 2015-10-22 ENCOUNTER — Encounter: Payer: Self-pay | Admitting: *Deleted

## 2015-10-22 ENCOUNTER — Ambulatory Visit (INDEPENDENT_AMBULATORY_CARE_PROVIDER_SITE_OTHER): Payer: BLUE CROSS/BLUE SHIELD

## 2015-10-22 DIAGNOSIS — I10 Essential (primary) hypertension: Secondary | ICD-10-CM | POA: Diagnosis not present

## 2015-10-22 DIAGNOSIS — I2510 Atherosclerotic heart disease of native coronary artery without angina pectoris: Secondary | ICD-10-CM

## 2015-10-22 DIAGNOSIS — R0989 Other specified symptoms and signs involving the circulatory and respiratory systems: Secondary | ICD-10-CM

## 2015-10-22 NOTE — Progress Notes (Signed)
Patient ID: Carl Lee, male   DOB: 1956/04/07, 60 y.o.   MRN: [id number] 24 hour ambulatory blood pressure monitor applied to patient.

## 2015-10-28 ENCOUNTER — Ambulatory Visit (INDEPENDENT_AMBULATORY_CARE_PROVIDER_SITE_OTHER): Payer: BLUE CROSS/BLUE SHIELD | Admitting: Pulmonary Disease

## 2015-10-28 ENCOUNTER — Encounter: Payer: Self-pay | Admitting: Pulmonary Disease

## 2015-10-28 DIAGNOSIS — R0602 Shortness of breath: Secondary | ICD-10-CM

## 2015-10-28 DIAGNOSIS — J449 Chronic obstructive pulmonary disease, unspecified: Secondary | ICD-10-CM | POA: Diagnosis not present

## 2015-10-28 MED ORDER — FLUTICASONE-SALMETEROL 230-21 MCG/ACT IN AERO: 2.0000 | INHALATION_SPRAY | Freq: Two times a day (BID) | RESPIRATORY_TRACT | Status: DC

## 2015-10-28 NOTE — Patient Instructions (Signed)
Take the new dose of Advair 2 puffs twice a day no matter how you feel We will order a lung function test and call you with the results We will see you back in 3 months or sooner if needed

## 2015-10-28 NOTE — Progress Notes (Signed)
Subjective:    Patient ID: Carl Lee, male    DOB: 10-01-1955, 60 y.o.   MRN: [id number]  Synopsis: Mr. horng had a CABG in 2015 and pulmonary critical care medicine was consulted for a COPD exacerbation. July 2015 lung function testing showed a ratio 62%, FEV1 of 2.10 L (56% predicted), total lung capacity 6.35 L (80% predicted). He smoked for many years and quit smoking in 2015.  HPI  Chief Complaint  Patient presents with  . Follow-up    pt c/o sinus congestion, sob with exertion, prod cough with gray colored mucus X2 weeks.     Sims has not smoked since the last visit.  His breathing has been worse recently in the last two weeks.  He worries about allergies.  He still has the chest pain from the surgery.  This is worse with the surgery.  He has some mucus, but he has a hard time distinguishing angina pain from his other respiratory symptoms.  He does cough up mucus in the mornings after using CPAP.  He has always had sinus symptoms and they have been worse in the last few weeks.  He doesn't take anything for it.  He had a cold a month ago, but this resolved.  He only rarely takes the rescue inhaler, this helps when he takes it.  He definitely feels that it helps.    Past Medical History  Diagnosis Date  . Bladder cancer ([place]) 08/16/2008  . Hypertension   . AAA (abdominal aortic aneurysm) ([place]) 03/25/12    s/p endovascular repair  . Hx of migraines   . Contact dermatitis     atypical [name])  . Childhood asthma   . Environmental allergies     improved as ages  . Diverticulosis 2013    severe by CT and colonoscopy  . Adenomatous polyp 06/2009  . Lumbosacral radiculopathy at S1 03/2012    left, with spinal stenosis (MRI 04/2013) improved with TF ESI L5/S1 and S1/2 ([place])  . Spinal stenosis     LS1 nerve root impingement from bulging disc  . Coronary artery disease     a.2015: CABG w/ LIMA-LAD, RIMA-OM, SVG-RCA b. Cath on 07/10/15 w/ patent LIMA-LAD and RIMA-OM.  SVG-RCA occluded but collaterals present  . Zenker diverticulum   . Resistance to clopidogrel 2015    drug metabolism panel run - asked to scan  . OSA (obstructive sleep apnea) 09/04/2014    Severe with AHI 35/hr  . Vitamin B12 deficiency   . Diabetes mellitus type 2, controlled, with complications ([place]) [id number]    CAD   . COPD (chronic obstructive pulmonary disease) ([place])   . Diabetes mellitus without complication ([place])     Type II  . Fracture of lateral malleolus of left ankle 08/29/2013  . Hepatic steatosis 06/2015    by [place]     Review of Systems  Constitutional: Positive for fatigue. Negative for fever and chills.  HENT: Negative for postnasal drip, rhinorrhea and sinus pressure.   Respiratory: Positive for cough. Negative for shortness of breath and wheezing.   Cardiovascular: Negative for chest pain, palpitations and leg swelling.       Objective:   Physical Exam Filed Vitals:   10/28/15 1437  BP: 134/72  Pulse: 71  Height: 5\' 11"  (1.803 m)  Weight: 211 lb (95.709 kg)  SpO2: 93%   RA  Gen: well appearing, no acute distress HEENT: NCAT, EOMi, OP clear,  PULM: Crackles bilateral bases, no wheezing,  normal effor CV: RRR, no mgr, no JVD GI: BS+, soft, nontender, MSK: normal bulk and tone Derm: no rash or skin breakdown, no edema Neuro: A&Ox4, CN II-XII intact, MAEW  Primary care records reviewed where he was treated for a sinus infection with Augmentin  Strep test 02/2015 no growth     Assessment & Plan:   COPD, moderate ([place]) He has been having more symptoms lately , specifically dyspnea and some mucus production. I think that this is related to his lifelong asthma as well as his moderate COPD in that there has been worsening pollen recently which are contributing to his symptoms. However, he also has a significant amount of chest pain which makes it difficult to distinguish if his dyspnea is coming from his lungs or from his cardiac issues. I personally  reviewed the images from his CT chest from a month ago which radiology says has moderate emphysema (I personally think is emphysema is mild) but there was no other pulmonary parenchymal abnormality to explain his symptoms. Lots entirely possible that his COPD contributes to his symptoms, I do worry that his ongoing cardiac issues are also at play.  Plan: Increase Advair to maximum dose to treat this as if there is an asthmatic component with the pollen out Repeat on her function test Follow-up 3 months  Coronary atherosclerosis of native coronary artery Continue follow-up with cardiology.  > 50% of time in a 26 minute visit spent face to face  Updated Medication List Outpatient Encounter Prescriptions as of 10/28/2015  Medication Sig  . albuterol (PROAIR HFA) 108 (90 BASE) MCG/ACT inhaler Inhale 2 puffs into the lungs every 6 (six) hours as needed for wheezing or shortness of breath.  [name] aspirin EC 325 MG EC tablet Take 1 tablet (325 mg total) by mouth daily.  [name] atorvastatin (LIPITOR) 20 MG tablet TAKE 1 TABLET (20 MG TOTAL) BY MOUTH DAILY AT 6 PM.  . carvedilol (COREG) 6.25 MG tablet Take 1 tablet (6.25 mg total) by mouth 2 (two) times daily with a meal.  . diclofenac sodium (VOLTAREN) 1 % GEL Apply topically 4 (four) times daily. Reported on 09/17/2015  . famotidine (PEPCID) 20 MG tablet Take 20 mg by mouth 2 (two) times daily.  [name] HYDROcodone-acetaminophen (NORCO/VICODIN) 5-325 MG tablet Take 1 tablet by mouth every 8 (eight) hours as needed for severe pain.  [name] lisinopril (PRINIVIL,ZESTRIL) 10 MG tablet Take 1 tablet (10 mg total) by mouth daily with breakfast.  . ranolazine (RANEXA) 1000 MG SR tablet Take 1 tablet (1,000 mg total) by mouth 2 (two) times daily.  . vitamin B-12 (CYANOCOBALAMIN) 1000 MCG tablet Take 1 tablet (1,000 mcg total) by mouth daily.  . [DISCONTINUED] fluticasone-salmeterol (ADVAIR HFA) 115-21 MCG/ACT inhaler Inhale 2 puffs into the lungs 2 (two) times daily.  .  [DISCONTINUED] levalbuterol (XOPENEX HFA) 45 MCG/ACT inhaler Inhale 1-2 puffs into the lungs every 6 (six) hours as needed for wheezing. (Patient not taking: Reported on 10/28/2015)   No facility-administered encounter medications on file as of 10/28/2015.

## 2015-10-28 NOTE — Assessment & Plan Note (Addendum)
He has been having more symptoms lately , specifically dyspnea and some mucus production. I think that this is related to his lifelong asthma as well as his moderate COPD in that there has been worsening pollen recently which are contributing to his symptoms. However, he also has a significant amount of chest pain which makes it difficult to distinguish if his dyspnea is coming from his lungs or from his cardiac issues. I personally reviewed the images from his CT chest from a month ago which radiology says has moderate emphysema (I personally think is emphysema is mild) but there was no other pulmonary parenchymal abnormality to explain his symptoms. Lots entirely possible that his COPD contributes to his symptoms, I do worry that his ongoing cardiac issues are also at play.  Plan: Increase Advair to maximum dose to treat this as if there is an asthmatic component with the pollen out Repeat on her function test Follow-up 3 months

## 2015-10-28 NOTE — Assessment & Plan Note (Signed)
Continue follow-up with cardiology 

## 2015-10-30 ENCOUNTER — Telehealth: Payer: Self-pay

## 2015-10-30 NOTE — Telephone Encounter (Signed)
Called patient about his BP monitor results. Per Dr. [name], average BP is in normal range. Patient was not satisfied with these results. Patient also stated that he stopped taking the coreg and went back to taking bystolic. Patient stated the Coreg just made he feel bad and weak. Patient stated he is still concerned about his fluctuation of his BPs. Will forward to Dr. [name] for advisement.

## 2015-10-31 MED ORDER — NEBIVOLOL HCL 2.5 MG PO TABS: 2.5000 mg | ORAL_TABLET | Freq: Every day | ORAL | Status: DC

## 2015-10-31 NOTE — Telephone Encounter (Signed)
Called patient back. Per Dr. [name], patient should not be concerned, his BP is okay, and he can continue bystolic. Patient verbalized understanding. Added Bystolic back to patient's medication list.

## 2015-11-03 ENCOUNTER — Ambulatory Visit (INDEPENDENT_AMBULATORY_CARE_PROVIDER_SITE_OTHER): Payer: BLUE CROSS/BLUE SHIELD | Admitting: *Deleted

## 2015-11-03 DIAGNOSIS — R0602 Shortness of breath: Secondary | ICD-10-CM | POA: Diagnosis not present

## 2015-11-03 LAB — PULMONARY FUNCTION TEST
DL/VA % pred: 49 %
DL/VA: 2.28 ml/min/mmHg/L
DLCO unc % pred: 39 %
DLCO unc: 12.77 ml/min/mmHg
FEF 25-75 Post: 0.89 L/sec
FEF 25-75 Pre: 0.97 L/sec
FEF2575-%CHANGE-POST: -7 %
FEF2575-%Pred-Post: 30 %
FEF2575-%Pred-Pre: 33 %
FEV1-%Change-Post: -2 %
FEV1-%PRED-POST: 52 %
FEV1-%PRED-PRE: 54 %
FEV1-POST: 1.88 L
FEV1-PRE: 1.92 L
FEV1FVC-%CHANGE-POST: -1 %
FEV1FVC-%Pred-Pre: 84 %
FEV6-%CHANGE-POST: 0 %
FEV6-%PRED-PRE: 66 %
FEV6-%Pred-Post: 66 %
FEV6-POST: 2.98 L
FEV6-PRE: 3 L
FEV6FVC-%Change-Post: 0 %
FEV6FVC-%PRED-POST: 103 %
FEV6FVC-%PRED-PRE: 103 %
FVC-%CHANGE-POST: 0 %
FVC-%PRED-PRE: 64 %
FVC-%Pred-Post: 64 %
FVC-POST: 3.04 L
FVC-PRE: 3.05 L
POST FEV6/FVC RATIO: 98 %
Post FEV1/FVC ratio: 62 %
Pre FEV1/FVC ratio: 63 %
Pre FEV6/FVC Ratio: 98 %
RV % PRED: 155 %
RV: 3.55 L
TLC % PRED: 99 %
TLC: 6.94 L

## 2015-11-03 NOTE — Progress Notes (Signed)
PFT performed today. 

## 2015-11-07 ENCOUNTER — Other Ambulatory Visit: Payer: Self-pay

## 2015-11-07 DIAGNOSIS — R0602 Shortness of breath: Secondary | ICD-10-CM

## 2015-11-10 HISTORY — PX: OTHER SURGICAL HISTORY: SHX169

## 2015-11-11 ENCOUNTER — Other Ambulatory Visit: Payer: Self-pay

## 2015-11-11 ENCOUNTER — Ambulatory Visit (HOSPITAL_COMMUNITY): Payer: BLUE CROSS/BLUE SHIELD | Attending: Internal Medicine

## 2015-11-11 DIAGNOSIS — G4733 Obstructive sleep apnea (adult) (pediatric): Secondary | ICD-10-CM | POA: Diagnosis not present

## 2015-11-11 DIAGNOSIS — I2510 Atherosclerotic heart disease of native coronary artery without angina pectoris: Secondary | ICD-10-CM | POA: Diagnosis not present

## 2015-11-11 DIAGNOSIS — E119 Type 2 diabetes mellitus without complications: Secondary | ICD-10-CM | POA: Insufficient documentation

## 2015-11-11 DIAGNOSIS — Z8249 Family history of ischemic heart disease and other diseases of the circulatory system: Secondary | ICD-10-CM | POA: Diagnosis not present

## 2015-11-11 DIAGNOSIS — Z951 Presence of aortocoronary bypass graft: Secondary | ICD-10-CM | POA: Diagnosis not present

## 2015-11-11 DIAGNOSIS — I071 Rheumatic tricuspid insufficiency: Secondary | ICD-10-CM | POA: Insufficient documentation

## 2015-11-11 DIAGNOSIS — I119 Hypertensive heart disease without heart failure: Secondary | ICD-10-CM | POA: Diagnosis not present

## 2015-11-11 DIAGNOSIS — Z87891 Personal history of nicotine dependence: Secondary | ICD-10-CM | POA: Diagnosis not present

## 2015-11-11 DIAGNOSIS — R0602 Shortness of breath: Secondary | ICD-10-CM | POA: Insufficient documentation

## 2015-11-11 DIAGNOSIS — R0600 Dyspnea, unspecified: Secondary | ICD-10-CM | POA: Diagnosis present

## 2015-11-14 ENCOUNTER — Encounter: Payer: Self-pay | Admitting: Family

## 2015-11-19 ENCOUNTER — Other Ambulatory Visit: Payer: Self-pay | Admitting: Physical Medicine and Rehabilitation

## 2015-11-19 DIAGNOSIS — M545 Low back pain: Secondary | ICD-10-CM

## 2015-11-19 DIAGNOSIS — M79629 Pain in unspecified upper arm: Secondary | ICD-10-CM

## 2015-11-19 DIAGNOSIS — M542 Cervicalgia: Secondary | ICD-10-CM

## 2015-11-19 DIAGNOSIS — R200 Anesthesia of skin: Secondary | ICD-10-CM

## 2015-11-19 DIAGNOSIS — M79605 Pain in left leg: Secondary | ICD-10-CM

## 2015-11-24 ENCOUNTER — Ambulatory Visit (HOSPITAL_COMMUNITY)
Admission: RE | Admit: 2015-11-24 | Discharge: 2015-11-24 | Disposition: A | Discharge status: Home / Self Care | Payer: BLUE CROSS/BLUE SHIELD | Source: Ambulatory Visit | Attending: Family | Admitting: Family

## 2015-11-24 ENCOUNTER — Encounter: Payer: Self-pay | Admitting: Family

## 2015-11-24 ENCOUNTER — Ambulatory Visit (INDEPENDENT_AMBULATORY_CARE_PROVIDER_SITE_OTHER): Payer: BLUE CROSS/BLUE SHIELD | Admitting: Family

## 2015-11-24 VITALS — BP 109/77 | HR 53 | Temp 98.1°F | Resp 14 | Ht 71.0 in | Wt 204.0 lb

## 2015-11-24 DIAGNOSIS — I714 Abdominal aortic aneurysm, without rupture, unspecified: Secondary | ICD-10-CM

## 2015-11-24 DIAGNOSIS — Z87891 Personal history of nicotine dependence: Secondary | ICD-10-CM | POA: Diagnosis not present

## 2015-11-24 DIAGNOSIS — E119 Type 2 diabetes mellitus without complications: Secondary | ICD-10-CM | POA: Diagnosis not present

## 2015-11-24 DIAGNOSIS — I2510 Atherosclerotic heart disease of native coronary artery without angina pectoris: Secondary | ICD-10-CM | POA: Insufficient documentation

## 2015-11-24 DIAGNOSIS — G4733 Obstructive sleep apnea (adult) (pediatric): Secondary | ICD-10-CM | POA: Insufficient documentation

## 2015-11-24 DIAGNOSIS — Z48812 Encounter for surgical aftercare following surgery on the circulatory system: Secondary | ICD-10-CM

## 2015-11-24 DIAGNOSIS — Z95828 Presence of other vascular implants and grafts: Secondary | ICD-10-CM

## 2015-11-24 DIAGNOSIS — I10 Essential (primary) hypertension: Secondary | ICD-10-CM | POA: Insufficient documentation

## 2015-11-24 DIAGNOSIS — R1032 Left lower quadrant pain: Secondary | ICD-10-CM

## 2015-11-24 DIAGNOSIS — Z4889 Encounter for other specified surgical aftercare: Secondary | ICD-10-CM

## 2015-11-24 NOTE — Progress Notes (Signed)
[facility] [place]    CC: Follow up s/p EVAR  History of Present Illness  Carl Lee is a 60 y.o. (1956-01-31) male patient of Dr. [name] who is back today for followup. He is status post endovascular repair of a symptomatic abdominal aortic aneurysm. This was done using a [employer] device. Date of surgery was 03/25/2012. The patient initially presented with abdominal pain to his GI doctor. A CT scan showed a large abdominal aortic aneurysm. On examination he had a very tender aorta and therefore Dr. [name] decided to take him to the operating room for repair. This repair was uncomplicated. He came back to the office several days later with complaints of bilateral flank pain and hypersensitivity in both upper thighs as well as numbness in his thighs. He continues to have back pain. He has had injections which have helped the pain in his left hip however he still has numbness. He has been very fatigued lately.  Pt states he does not have post prandial abdominal pain.  He saw Dr. Ardis Hughs, GI, for a colonoscopy since the EVAR.  He has spinal stenosis in his lumbar spine, states he gets temporary relief from pain with back injections, last injections. He also sees a pulmonologist, Dr. Waunita Schooner, states his dyspnea has improved.  He is s/p CABG X3 vessels in July 2015; he denies any hx of MI. Pt states he has felt tired since this CABG, states his cardiologist, Dr. [name], is aware.  He denies non healing wounds in in his feet or lower legs.  Pt denies any hx of stroke or TIA  He has DM: (Review of records): His AIC in December 2016 was 6.1, in control He is a former smoker having quit in July 2015. He takes a daily 325 mg ASA, and no other antiplatelets nor anticoagulants.  He takes a daily statin.     Past Medical History  Diagnosis Date  . Bladder cancer ([place]) 08/16/2008  . Hypertension   . AAA (abdominal aortic aneurysm) ([place]) 03/25/12    s/p endovascular  repair  . Hx of migraines   . Contact dermatitis     atypical [name])  . Childhood asthma   . Environmental allergies     improved as ages  . Diverticulosis 2013    severe by CT and colonoscopy  . Adenomatous polyp 06/2009  . Lumbosacral radiculopathy at S1 03/2012    left, with spinal stenosis (MRI 04/2013) improved with TF ESI L5/S1 and S1/2 ([facility] [place])  . Spinal stenosis     LS1 nerve root impingement from bulging disc  . Coronary artery disease     a.2015: CABG w/ LIMA-LAD, RIMA-OM, SVG-RCA b. Cath on 07/10/15 w/ patent LIMA-LAD and RIMA-OM. SVG-RCA occluded but collaterals present  . Zenker diverticulum   . Resistance to clopidogrel 2015    drug metabolism panel run - asked to scan  . OSA (obstructive sleep apnea) 09/04/2014    Severe with AHI 35/hr  . Vitamin B12 deficiency   . Diabetes mellitus type 2, controlled, with complications ([place]) [id number]    CAD   . COPD (chronic obstructive pulmonary disease) ([place])   . Diabetes mellitus without complication ([place])     Type II  . Fracture of lateral malleolus of left ankle 08/29/2013  . Hepatic steatosis 06/2015    by [place]   Past Surgical History  Procedure Laterality Date  . Tonsillectomy and adenoidectomy  1973  . Vasectomy    . Bladder  cancer  March 2010 and Oct. 2015    [facility]) x 2  . Abdominal aortic aneurysm repair  03/25/12    endovascular  . Colonoscopy  06/2012    hyperplastic polyp, diverticulosis ([facility]) rec rpt 5 yrs  . Cystoscopy  08/16/08    Bladder Cancer  . Esi  04/2013, 06/2013    L L5S1, S12 transforaminal ESI (Dr.  [name])  . Abi  05/2013    WNL, L TBI low at 0.66  . Coronary artery bypass graft N/A 02/07/2014    Procedure: CORONARY ARTERY BYPASS GRAFTING (CABG);  Surgeon: [name], MD;  Location: [facility];  Service: Open Heart Surgery;  Laterality: N/A;  CABG X 3, BILATERAL LIMA, EVH  . Intraoperative transesophageal echocardiogram N/A 02/07/2014    Procedure: INTRAOPERATIVE  TRANSESOPHAGEAL ECHOCARDIOGRAM;  Surgeon: [name], MD;  Location: [facility];  Service: Open Heart Surgery;  Laterality: N/A;  . Esi Left 04/2014, 05/2014, 06/2014    L5/S1, S1/2; rpt; L4/5  . Left heart catheterization with coronary angiogram N/A 02/01/2014    Procedure: LEFT HEART CATHETERIZATION WITH CORONARY ANGIOGRAM;  Surgeon: [name], MD;  Location: [facility] CATH LAB;  Service: Cardiovascular;  Laterality: N/A;  . Epidural block injection Left 09/2014, 10/2014, 12/2014    medial L2,3,4, dorsal L5 ramus blocks x2, L L5/S1 and S1/2 transforaminal ESI ([facility] [place])  . Cardiac catheterization N/A 07/10/2015    Procedure: Left Heart Cath and Cors/Grafts Angiography;  Surgeon: [name], MD;  Location: [facility] CV LAB;  Service: Cardiovascular;  Laterality: N/A;   Social History Social History  Substance Use Topics  . Smoking status: Former Smoker -- 1.00 packs/day for 40 years    Types: Cigarettes    Quit date: 02/05/2014  . Smokeless tobacco: Never Used  . Alcohol Use: 0.0 oz/week    0 Standard drinks or equivalent per week     Comment: Rare- states 1 oz. per week   Family History Family History  Problem Relation Age of Onset  . Diabetes Mother   . Arrhythmia Mother     pacemaker  . Cancer Mother     Bladder  . COPD Mother   . Thyroid disease Mother   . Hyperlipidemia Mother   . Hypertension Mother   . Diabetes Father   . CAD Father 19    CHF, MI  . Dementia Father   . Heart attack Father   . Diabetes Sister   . Colon cancer Neg Hx   . Diabetes Brother   . Hypertension Brother   . Heart attack Maternal Grandmother    Current Outpatient Prescriptions on File Prior to Visit  Medication Sig Dispense Refill  . albuterol (PROAIR HFA) 108 (90 BASE) MCG/ACT inhaler Inhale 2 puffs into the lungs every 6 (six) hours as needed for wheezing or shortness of breath. 1 Inhaler 2  . aspirin EC 325 MG EC tablet Take 1 tablet (325 mg total) by mouth  daily.    [name] atorvastatin (LIPITOR) 20 MG tablet TAKE 1 TABLET (20 MG TOTAL) BY MOUTH DAILY AT 6 PM. 90 tablet 3  . diclofenac sodium (VOLTAREN) 1 % GEL Apply topically 4 (four) times daily. Reported on 09/17/2015    . famotidine (PEPCID) 20 MG tablet Take 20 mg by mouth 2 (two) times daily.  3  . fluticasone-salmeterol (ADVAIR HFA) 230-21 MCG/ACT inhaler Inhale 2 puffs into the lungs 2 (two) times daily. 1 Inhaler 12  . HYDROcodone-acetaminophen (NORCO/VICODIN) 5-325 MG tablet  Take 1 tablet by mouth every 8 (eight) hours as needed for severe pain. 30 tablet 0  . lisinopril (PRINIVIL,ZESTRIL) 10 MG tablet Take 1 tablet (10 mg total) by mouth daily with breakfast.    . nebivolol (BYSTOLIC) 2.5 MG tablet Take 1 tablet (2.5 mg total) by mouth daily. 30 tablet 11  . ranolazine (RANEXA) 1000 MG SR tablet Take 1 tablet (1,000 mg total) by mouth 2 (two) times daily. 60 tablet 11  . vitamin B-12 (CYANOCOBALAMIN) 1000 MCG tablet Take 1 tablet (1,000 mcg total) by mouth daily.     No current facility-administered medications on file prior to visit.   Allergies  Allergen Reactions  . Codeine Other (See Comments)    Nightmares  . Doxycycline Hives    mild     ROS: See HPI for pertinent positives and negatives.  Physical Examination  Filed Vitals:   11/24/15 0910  BP: 109/77  Pulse: 53  Temp: 98.1 F (36.7 C)  TempSrc: Oral  Resp: 14  Height: 5\' 11"  (1.803 m)  Weight: 204 lb (92.534 kg)  SpO2: 95%   Body mass index is 28.46 kg/(m^2).  General: A&O x 3, WD.  Pulmonary: Sym exp, respirations are non labored, good air movt, CTAB, no rales,  rhonchi, or wheezing.  Cardiac: RRR, Nl S1, S2, no murmur appreciated  Vascular: Vessel Right Left  Radial 1+Palpable 1+Palpable  Carotid audible without bruit audible without bruit  Aorta Not palpable N/A  Femoral Not Palpable Not Palpable  Popliteal Not palpable Not palpable  PT 1+Palpable 2+Palpable  DP Not Palpable Not  Palpable   Gastrointestinal: soft, moderately tender to palpation LLQ, -G/R, - HSM, - palpable masses, - CVAT B.  Musculoskeletal: M/S 5/5 throughout, Extremities without ischemic changes.  Neurologic: Pain and light touch intact in extremities, Motor exam as listed above         Non-Invasive Vascular Imaging  EVAR Duplex (Date: 11/24/2015) ABDOMINAL AORTA DUPLEX EVALUATION - POST ENDOVASCULAR REPAIR    INDICATION: Evaluation of endovascular repair    PREVIOUS INTERVENTION(S): Evar 03/25/2012    DUPLEX EXAM:      DIAMETER AP (cm) DIAMETER TRANSVERSE (cm) VELOCITIES (cm/sec)  Aorta 4.0 - 104  Right Common Iliac 1.3 1.1 140  Left Common Iliac 1.3 1.2 60    Comparison Study       Date DIAMETER AP (cm) DIAMETER TRANSVERSE (cm)  11/18/2014 4.1 4.4     ADDITIONAL FINDINGS:     IMPRESSION: 1. Suboptimal exam due to  body habitus and bowel gas 2. Patent aortic repair with a maximum diameter of 4.0 cm/s 3. Unable to thoroughly visualized the iliac arteries    Compared to the previous exam:  Unable to obtain iliac artery diameters     Medical Decision Making  CHANDER MCCARGO is a 60 y.o. male who presents s/p EVAR (Date: 03/25/12).  Pt is symptomatic with an apparent stable sac size, but this is a suboptimal exam due to  body habitus and bowel gas. Unable to thoroughly visualized the iliac arteries. I discussed with Dr. [name] pt's moderate LLQ abdominal pain on palpation, results of December 2016 abdominal US to evaluate abdominal pain; AAA diameter on that report was 3.4 cm. Will schedule CTA abdomen/pelvis by or before this Friday, 11/28/15 and see Dr. [name] in the office on 11/28/15 to discuss results. He has an MRA of his cervical and lumbar spine w/o contrast scheduled for 11/27/15 and states he would like to have these done on the  same day at the same facility; this [facility] at [facility].   I discussed with the patient the importance of surveillance of the  endograft.  I emphasized the importance of maximal medical management including strict control of blood pressure, blood glucose, and lipid levels, antiplatelet agents, obtaining regular exercise, and cessation of smoking.   Thank you for allowing [place] to participate in this patient's care.  Clemon Chambers, RN, MSN, FNP-C [facility] [place] Office: [facility] Physician: [name]  11/24/2015, 9:17 AM

## 2015-11-25 NOTE — Addendum Note (Signed)
Addended by: Thresa Ross C on: 11/25/2015 10:26 AM   Modules accepted: Orders

## 2015-11-26 ENCOUNTER — Telehealth: Payer: Self-pay | Admitting: Surgery

## 2015-11-26 NOTE — Telephone Encounter (Signed)
Spoke with pt - CTA scheduled [place] 11/28/15 1:30 pm, no solids 4 hrs prior Follow up with Dr. [name] 11/28/15 3:15 pm. Verbalized understanding.

## 2015-11-27 ENCOUNTER — Ambulatory Visit
Admission: RE | Admit: 2015-11-27 | Discharge: 2015-11-27 | Disposition: A | Discharge status: Home / Self Care | Payer: BLUE CROSS/BLUE SHIELD | Source: Ambulatory Visit | Attending: Physical Medicine and Rehabilitation | Admitting: Physical Medicine and Rehabilitation

## 2015-11-27 ENCOUNTER — Encounter: Payer: Self-pay | Admitting: Surgery

## 2015-11-27 DIAGNOSIS — M79605 Pain in left leg: Secondary | ICD-10-CM

## 2015-11-27 DIAGNOSIS — R200 Anesthesia of skin: Secondary | ICD-10-CM

## 2015-11-27 DIAGNOSIS — M79629 Pain in unspecified upper arm: Secondary | ICD-10-CM

## 2015-11-27 DIAGNOSIS — M542 Cervicalgia: Secondary | ICD-10-CM

## 2015-11-27 DIAGNOSIS — M545 Low back pain: Secondary | ICD-10-CM

## 2015-11-28 ENCOUNTER — Ambulatory Visit (HOSPITAL_COMMUNITY)
Admission: RE | Admit: 2015-11-28 | Discharge: 2015-11-28 | Disposition: A | Discharge status: Home / Self Care | Payer: BLUE CROSS/BLUE SHIELD | Source: Ambulatory Visit | Attending: Family | Admitting: Family

## 2015-11-28 ENCOUNTER — Encounter: Payer: Self-pay | Admitting: Surgery

## 2015-11-28 ENCOUNTER — Encounter (HOSPITAL_COMMUNITY): Payer: Self-pay

## 2015-11-28 ENCOUNTER — Ambulatory Visit (INDEPENDENT_AMBULATORY_CARE_PROVIDER_SITE_OTHER): Payer: BLUE CROSS/BLUE SHIELD | Admitting: Surgery

## 2015-11-28 VITALS — BP 123/78 | HR 55 | Temp 97.7°F | Resp 16 | Ht 71.0 in | Wt 204.9 lb

## 2015-11-28 DIAGNOSIS — R1032 Left lower quadrant pain: Secondary | ICD-10-CM | POA: Insufficient documentation

## 2015-11-28 DIAGNOSIS — I714 Abdominal aortic aneurysm, without rupture, unspecified: Secondary | ICD-10-CM

## 2015-11-28 DIAGNOSIS — Z95828 Presence of other vascular implants and grafts: Secondary | ICD-10-CM | POA: Diagnosis not present

## 2015-11-28 DIAGNOSIS — I252 Old myocardial infarction: Secondary | ICD-10-CM | POA: Diagnosis not present

## 2015-11-28 DIAGNOSIS — K5730 Diverticulosis of large intestine without perforation or abscess without bleeding: Secondary | ICD-10-CM | POA: Diagnosis not present

## 2015-11-28 LAB — POCT I-STAT CREATININE: Creatinine, Ser: 0.9 mg/dL (ref 0.61–1.24)

## 2015-11-28 MED ORDER — IOPAMIDOL (ISOVUE-370) INJECTION 76%
100.0000 mL | Freq: Once | INTRAVENOUS | Status: AC | PRN
  Administered 2015-11-28: 100 mL via INTRAVENOUS

## 2015-11-28 NOTE — Progress Notes (Signed)
[facility] Specialist of [facility]  Patient name: Carl Lee MRN: [id number] DOB: 09/24/1955 Sex: male  REASON FOR VISIT: follow up  HPI: Carl Lee is a 60 y.o. ([birth year]) male who is back today for followup. He is status post endovascular repair of a symptomatic abdominal aortic aneurysm. This was done using a [employer] device. Date of surgery was 03/25/2012. The patient initially presented with abdominal pain to his GI doctor. A CT scan showed a large abdominal aortic aneurysm. On examination he had a very tender aorta and therefore Dr. [name] decided to take him to the operating room for repair. This repair was uncomplicated. He came back to the office several days later with complaints of bilateral flank pain and hypersensitivity in both upper thighs as well as numbness in his thighs.There was difficulty with visualization on his surveillance ultrasound image and therefore a CT scan was recommended for which the patient had earlier today.  Past Medical History  Diagnosis Date  . Bladder cancer ([place]) 08/16/2008  . Hypertension   . AAA (abdominal aortic aneurysm) ([place]) 03/25/12    s/p endovascular repair  . Hx of migraines   . Contact dermatitis     atypical [name])  . Childhood asthma   . Environmental allergies     improved as ages  . Diverticulosis 2013    severe by CT and colonoscopy  . Adenomatous polyp 06/2009  . Lumbosacral radiculopathy at S1 03/2012    left, with spinal stenosis (MRI 04/2013) improved with TF ESI L5/S1 and S1/2 ([facility] [place])  . Spinal stenosis     LS1 nerve root impingement from bulging disc  . Coronary artery disease     a.2015: CABG w/ LIMA-LAD, RIMA-OM, SVG-RCA b. Cath on 07/10/15 w/ patent LIMA-LAD and RIMA-OM. SVG-RCA occluded but collaterals present  . Zenker diverticulum   . Resistance to clopidogrel 2015    drug metabolism panel run - asked to scan  . OSA (obstructive sleep apnea) 09/04/2014    Severe with AHI 35/hr  .  Vitamin B12 deficiency   . Diabetes mellitus type 2, controlled, with complications ([place]) [id number]    CAD   . COPD (chronic obstructive pulmonary disease) ([place])   . Diabetes mellitus without complication ([place])     Type II  . Fracture of lateral malleolus of left ankle 08/29/2013  . Hepatic steatosis 06/2015    by [place]    Family History  Problem Relation Age of Onset  . Diabetes Mother   . Arrhythmia Mother     pacemaker  . Cancer Mother     Bladder  . COPD Mother   . Thyroid disease Mother   . Hyperlipidemia Mother   . Hypertension Mother   . Diabetes Father   . CAD Father 18    CHF, MI  . Dementia Father   . Heart attack Father   . Diabetes Sister   . Colon cancer Neg Hx   . Diabetes Brother   . Hypertension Brother   . Heart attack Maternal Grandmother     SOCIAL HISTORY: Social History  Substance Use Topics  . Smoking status: Former Smoker -- 1.00 packs/day for 40 years    Types: Cigarettes    Quit date: 02/05/2014  . Smokeless tobacco: Never Used  . Alcohol Use: 0.0 oz/week    0 Standard drinks or equivalent per week     Comment: Rare- states 1 oz. per week    Allergies  Allergen Reactions  .  Codeine Other (See Comments)    Nightmares  . Doxycycline Hives    mild    Current Outpatient Prescriptions  Medication Sig Dispense Refill  . albuterol (PROAIR HFA) 108 (90 BASE) MCG/ACT inhaler Inhale 2 puffs into the lungs every 6 (six) hours as needed for wheezing or shortness of breath. 1 Inhaler 2  . aspirin EC 325 MG EC tablet Take 1 tablet (325 mg total) by mouth daily.    [name] atorvastatin (LIPITOR) 20 MG tablet TAKE 1 TABLET (20 MG TOTAL) BY MOUTH DAILY AT 6 PM. 90 tablet 3  . diclofenac sodium (VOLTAREN) 1 % GEL Apply topically 4 (four) times daily. Reported on 09/17/2015    . famotidine (PEPCID) 20 MG tablet Take 20 mg by mouth 2 (two) times daily.  3  . fluticasone-salmeterol (ADVAIR HFA) 230-21 MCG/ACT inhaler Inhale 2 puffs into the lungs 2 (two) times  daily. 1 Inhaler 12  . HYDROcodone-acetaminophen (NORCO/VICODIN) 5-325 MG tablet Take 1 tablet by mouth every 8 (eight) hours as needed for severe pain. 30 tablet 0  . lisinopril (PRINIVIL,ZESTRIL) 10 MG tablet Take 1 tablet (10 mg total) by mouth daily with breakfast.    . nebivolol (BYSTOLIC) 2.5 MG tablet Take 1 tablet (2.5 mg total) by mouth daily. 30 tablet 11  . ranolazine (RANEXA) 1000 MG SR tablet Take 1 tablet (1,000 mg total) by mouth 2 (two) times daily. 60 tablet 11  . vitamin B-12 (CYANOCOBALAMIN) 1000 MCG tablet Take 1 tablet (1,000 mcg total) by mouth daily.     No current facility-administered medications for this visit.    REVIEW OF SYSTEMS:  [X]  denotes positive finding, [ ]  denotes negative finding Cardiac  Comments:  Chest pain or chest pressure:    Shortness of breath upon exertion:    Short of breath when lying flat:    Irregular heart rhythm:        Vascular    Pain in calf, thigh, or hip brought on by ambulation:    Pain in feet at night that wakes you up from your sleep:     Blood clot in your veins:    Leg swelling:         Pulmonary    Oxygen at home:    Productive cough:     Wheezing:  x       Neurologic    Sudden weakness in arms or legs:     Sudden numbness in arms or legs:     Sudden onset of difficulty speaking or slurred speech:    Temporary loss of vision in one eye:     Problems with dizziness:         Gastrointestinal    Blood in stool:     Vomited blood:         Genitourinary    Burning when urinating:     Blood in urine:        Psychiatric    Major depression:         Hematologic    Bleeding problems:    Problems with blood clotting too easily:        Skin    Rashes or ulcers:        Constitutional    Fever or chills:      PHYSICAL EXAM: There were no vitals filed for this visit.  GENERAL: The patient is a well-nourished male, in no acute distress. The vital signs are documented above. PULMONARY: Nonlabored  respirations MUSCULOSKELETAL: There are no  major deformities or cyanosis. NEUROLOGIC: No focal weakness or paresthesias are detected. SKIN: There are no ulcers or rashes noted. PSYCHIATRIC: The patient has a normal affect.  DATA:  I have reviewed his CT scan with the following findings: Endovascular repair of the abdominal aortic aneurysm. Aortic aneurysm sac is excluded and continues to decrease in size. No evidence for an endoleak.  Stent graft remains patent but there is increased mural thrombus in the main stent body as described.  No acute abnormalities in the abdomen or pelvis.  Colonic diverticulosis.  MEDICAL ISSUES: Abdominal aortic aneurysm: The patient has had difficulty with visualization on ultrasound.  He had a CT scan today that shows that the maximum diameter of his aneurysm has decreased from 5.2 before it was face, down to 4.4 today.  There are no complicating features.  There is no evidence of endoleak.  Because he cannot be visualized with ultrasound, I have recommended getting a repeat CT scan in 2 years.    Annamarie Major [facility] [employer]: [phone]

## 2015-12-03 IMAGING — DX DG CHEST 2V
2 series · 2 of 2 positions shown · non-contrast
Comparison: 10/31/2014

CLINICAL DATA: Short of breath for 3 weeks

EXAM:
CHEST  2 VIEW

[chest pa]
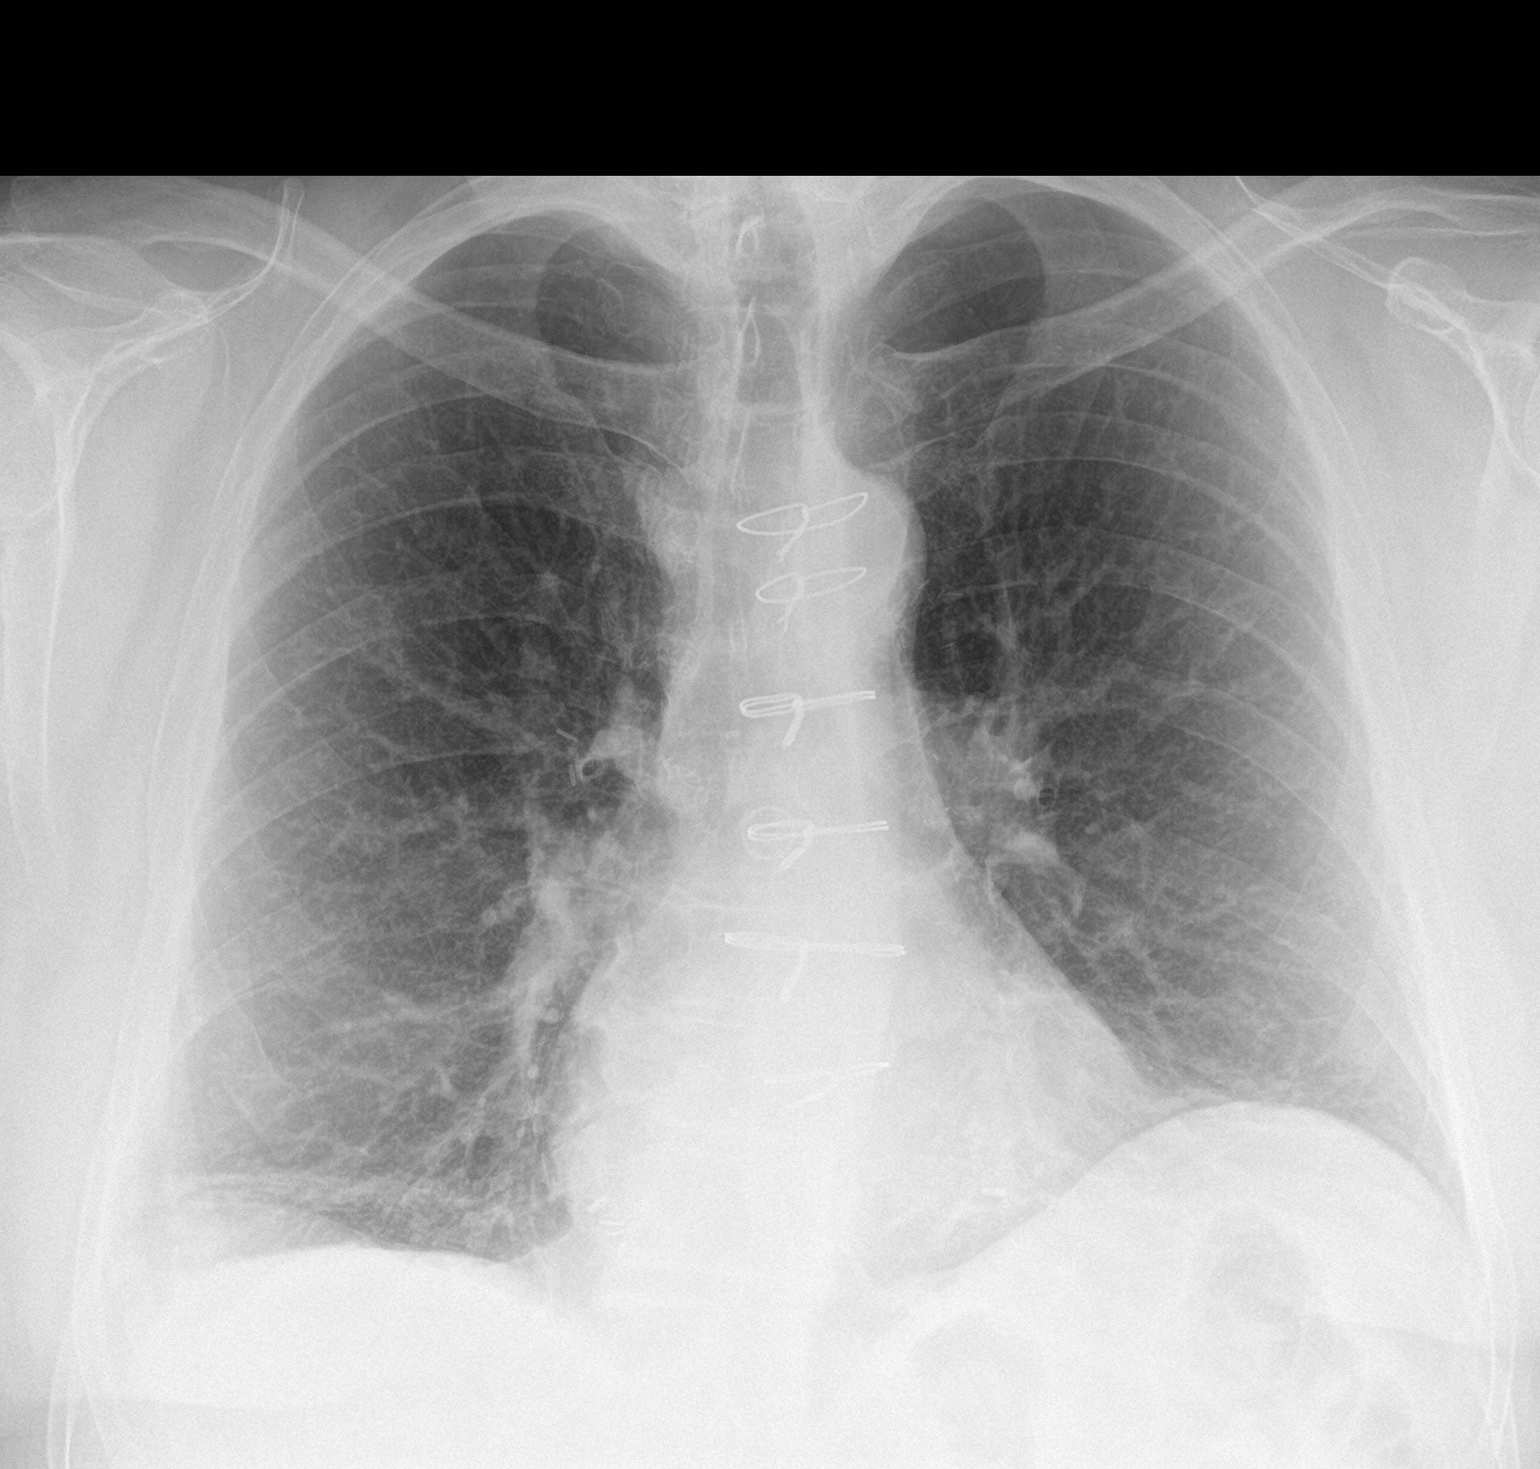

[chest lat]
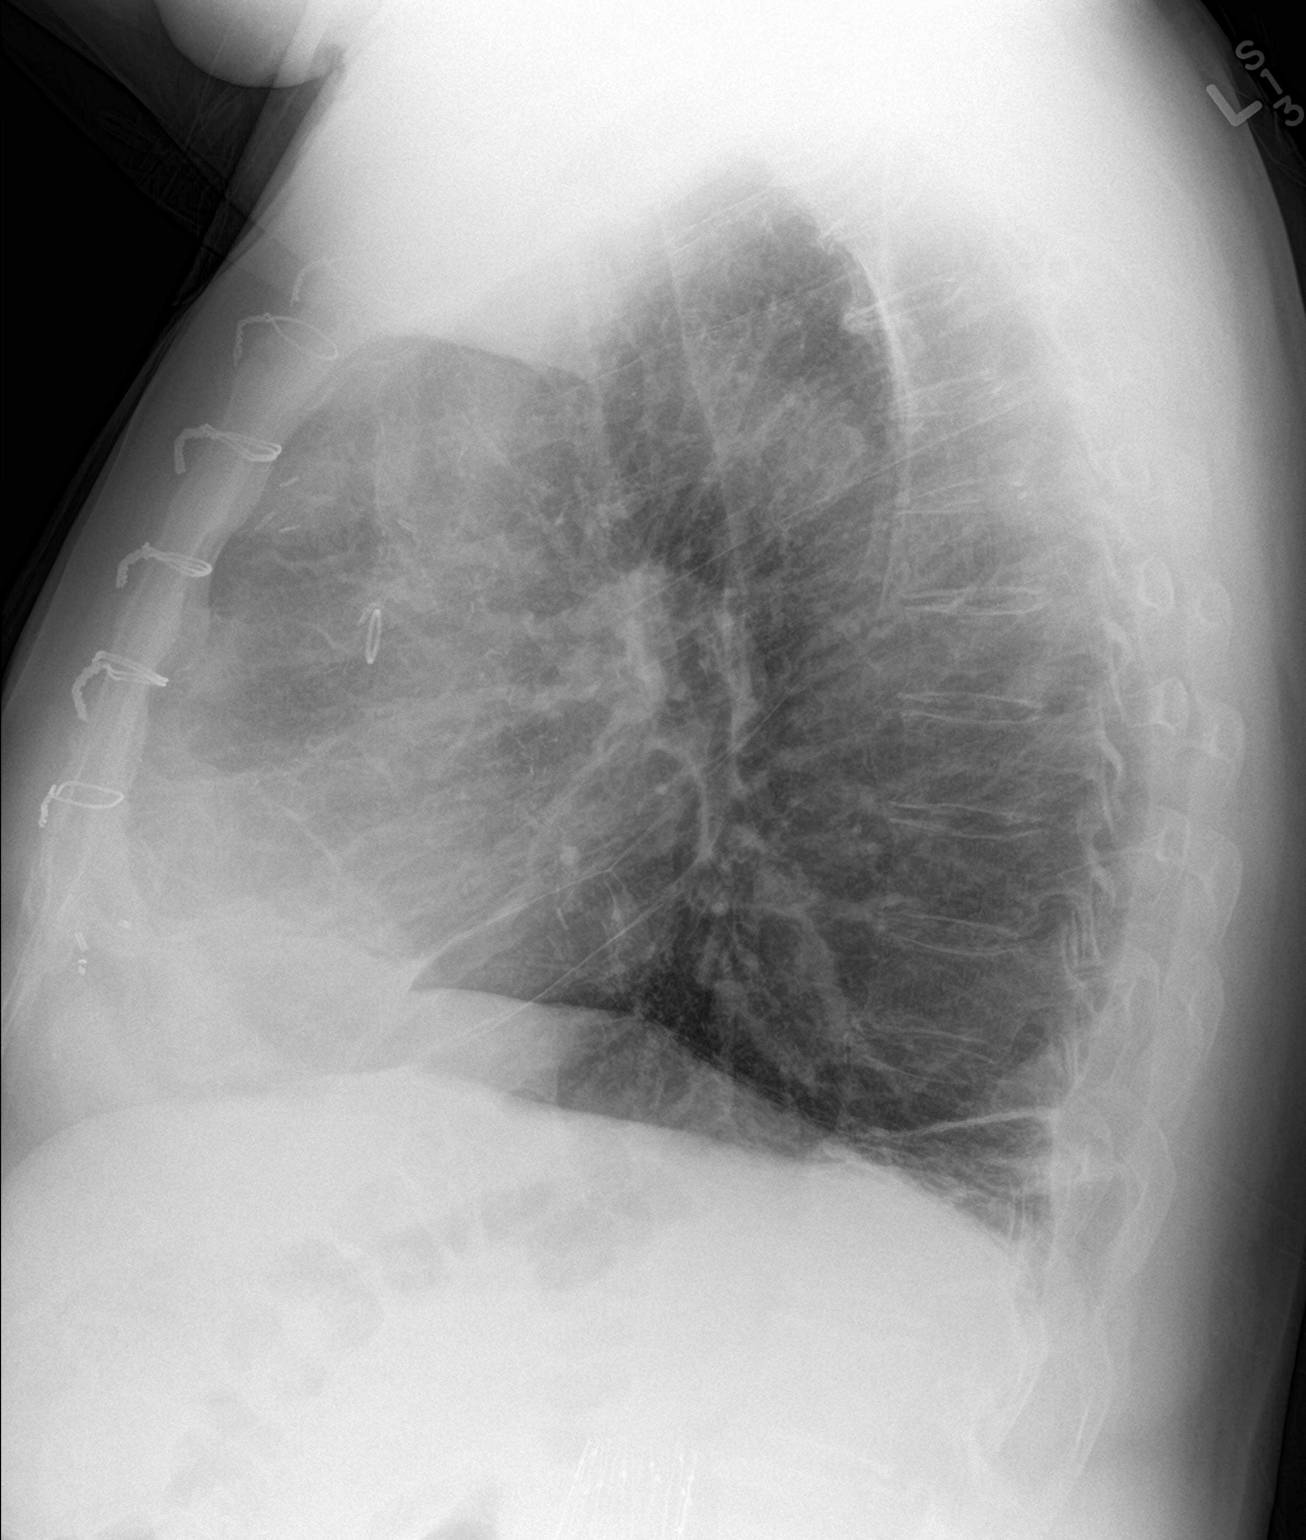

[2 of 2 positions shown; findings below may reference images not displayed]

FINDINGS: Left basilar atelectasis for scar is stable. Right basilar
atelectasis is increased. No pneumothorax. No pleural effusion.
Osteopenia. Normal heart size
IMPRESSION: Increased atelectasis at the right base.

## 2015-12-05 NOTE — Addendum Note (Signed)
Addended by: Thresa Ross C on: 12/05/2015 12:51 PM   Modules accepted: Orders

## 2015-12-13 ENCOUNTER — Encounter: Payer: Self-pay | Admitting: Family Medicine

## 2015-12-15 ENCOUNTER — Other Ambulatory Visit: Payer: Self-pay | Admitting: Family Medicine

## 2015-12-15 DIAGNOSIS — Z7901 Long term (current) use of anticoagulants: Secondary | ICD-10-CM

## 2015-12-19 ENCOUNTER — Ambulatory Visit: Payer: BLUE CROSS/BLUE SHIELD

## 2015-12-23 ENCOUNTER — Encounter: Payer: Self-pay | Admitting: Family Medicine

## 2015-12-23 ENCOUNTER — Ambulatory Visit (INDEPENDENT_AMBULATORY_CARE_PROVIDER_SITE_OTHER): Payer: BLUE CROSS/BLUE SHIELD | Admitting: Family Medicine

## 2015-12-23 VITALS — BP 114/70 | HR 72 | Temp 98.2°F | Wt 207.2 lb

## 2015-12-23 DIAGNOSIS — Z23 Encounter for immunization: Secondary | ICD-10-CM

## 2015-12-23 DIAGNOSIS — I25709 Atherosclerosis of coronary artery bypass graft(s), unspecified, with unspecified angina pectoris: Secondary | ICD-10-CM

## 2015-12-23 DIAGNOSIS — Z95828 Presence of other vascular implants and grafts: Secondary | ICD-10-CM | POA: Diagnosis not present

## 2015-12-23 DIAGNOSIS — J449 Chronic obstructive pulmonary disease, unspecified: Secondary | ICD-10-CM

## 2015-12-23 DIAGNOSIS — E538 Deficiency of other specified B group vitamins: Secondary | ICD-10-CM

## 2015-12-23 DIAGNOSIS — E785 Hyperlipidemia, unspecified: Secondary | ICD-10-CM | POA: Diagnosis not present

## 2015-12-23 DIAGNOSIS — I10 Essential (primary) hypertension: Secondary | ICD-10-CM

## 2015-12-23 MED ORDER — VITAMIN B-12 500 MCG PO TABS: 500.0000 ug | ORAL_TABLET | Freq: Every day | ORAL | Status: DC

## 2015-12-23 NOTE — Assessment & Plan Note (Addendum)
Appreciate cards care of patient. Latest LHC showing 2/3 patent grafts. rec medical management.

## 2015-12-23 NOTE — Assessment & Plan Note (Signed)
Compliant with lipitor 20mg  daily. Continue.

## 2015-12-23 NOTE — Patient Instructions (Addendum)
zostavax today (shingles shot).  Good to see you today, call us with questions.   Return as needed or in 6 months for physical.

## 2015-12-23 NOTE — Assessment & Plan Note (Deleted)
Appreciate cards care of patient.  

## 2015-12-23 NOTE — Assessment & Plan Note (Signed)
Appreciate pulm care - dyspnea essentially resolved with higher advair dose.

## 2015-12-23 NOTE — Assessment & Plan Note (Signed)
Continues regular f/u with [facility] ([facility]) - rec CT Q23yrs.

## 2015-12-23 NOTE — Assessment & Plan Note (Signed)
Chronic, stable. Better control however patient endorses occasional low bp to 90/60 associated with increased fatigue/sleepiness. Discussed ok to try 5mg  lisinopril in am and recommended continue nebivolol 2.5mg  nightly.

## 2015-12-23 NOTE — Assessment & Plan Note (Signed)
Decrease B12 orally to 539mcg daily. Check B12 levels next labs

## 2015-12-23 NOTE — Progress Notes (Signed)
Pre visit review using our clinic review tool, if applicable. No additional management support is needed unless otherwise documented below in the visit note. 

## 2015-12-23 NOTE — Progress Notes (Signed)
BP 114/70 mmHg  Pulse 72  Temp(Src) 98.2 F (36.8 C) (Oral)  Wt 207 lb 4 oz (94.008 kg)   CC: f/u visit  Subjective:    Patient ID: Carl Lee, male    DOB: 1955-11-11, 60 y.o.   MRN: [id number]  HPI: Carl Lee is a 60 y.o. male presenting on 12/23/2015 for Follow-up   AAA s/p endovascular repair followed by [facility] rec Q33yr CT scan. Also with mild carotid stenosis.   Established with Dr [name] for moderate COPD + lifelong asthma. Latest advair increased to max dose. This has significantly helped dyspnea.   Known CAD s/p CABG 2015 followed by Dr [name]. Latest LHC 06/2015 showing occluded S-RCA, rec medical management, but nitrates were not added due to labile blood pressures and severe headache (actually stayed an extra day in hospital due to HA). Ongoing MSK chest pain.   HAs have been intermittent but mild compared to previously.  PRP injections to back, elbow, neck (Dr Ace Gins) have been helpful.   Compliant with medication including lipitor for cholesterol and lisinopril and nebivolol once daily for hypertension. Endorses occasional lows to 90/60s, rare highs >140.   Ongoing trouble with thrush. Has treated with nystatin suspension. Tongue soreness continues.   Relevant past medical, surgical, family and social history reviewed and updated as indicated. Interim medical history since our last visit reviewed. Allergies and medications reviewed and updated. Current Outpatient Prescriptions on File Prior to Visit  Medication Sig  . albuterol (PROAIR HFA) 108 (90 BASE) MCG/ACT inhaler Inhale 2 puffs into the lungs every 6 (six) hours as needed for wheezing or shortness of breath.  [name] aspirin EC 325 MG EC tablet Take 1 tablet (325 mg total) by mouth daily.  [name] atorvastatin (LIPITOR) 20 MG tablet TAKE 1 TABLET (20 MG TOTAL) BY MOUTH DAILY AT 6 PM.  . diclofenac sodium (VOLTAREN) 1 % GEL Apply topically 4 (four) times daily. Reported on 09/17/2015  . famotidine (PEPCID) 20 MG tablet  Take 20 mg by mouth 2 (two) times daily.  . fluticasone-salmeterol (ADVAIR HFA) 230-21 MCG/ACT inhaler Inhale 2 puffs into the lungs 2 (two) times daily.  [name] HYDROcodone-acetaminophen (NORCO/VICODIN) 5-325 MG tablet Take 1 tablet by mouth every 8 (eight) hours as needed for severe pain.  [name] lisinopril (PRINIVIL,ZESTRIL) 10 MG tablet Take 1 tablet (10 mg total) by mouth daily with breakfast. (Patient taking differently: Take 5-10 mg by mouth daily with breakfast. )  . nebivolol (BYSTOLIC) 2.5 MG tablet Take 1 tablet (2.5 mg total) by mouth daily.  . ranolazine (RANEXA) 1000 MG SR tablet Take 1 tablet (1,000 mg total) by mouth 2 (two) times daily.   No current facility-administered medications on file prior to visit.    Review of Systems Per HPI unless specifically indicated in ROS section     Objective:    BP 114/70 mmHg  Pulse 72  Temp(Src) 98.2 F (36.8 C) (Oral)  Wt 207 lb 4 oz (94.008 kg)  Wt Readings from Last 3 Encounters:  12/23/15 207 lb 4 oz (94.008 kg)  11/28/15 204 lb 14.4 oz (92.942 kg)  11/24/15 204 lb (92.534 kg)    Physical Exam  Constitutional: He appears well-developed and well-nourished. No distress.  HENT:  Mouth/Throat: Oropharynx is clear and moist. No oropharyngeal exudate.  Cardiovascular: Normal rate, regular rhythm, normal heart sounds and intact distal pulses.   No murmur heard. Pulmonary/Chest: Effort normal and breath sounds normal. No respiratory distress. He has no wheezes. He has  no rales.  Musculoskeletal: He exhibits no edema.  Skin: Skin is warm and dry. No rash noted.  Psychiatric: He has a normal mood and affect.  Nursing note and vitals reviewed.  Results for orders placed or performed during the hospital encounter of 11/28/15  I-STAT creatinine  Result Value Ref Range   Creatinine, Ser 0.90 0.61 - 1.24 mg/dL   Lab Results  Component Value Date   HGBA1C 6.1 06/20/2015    Lab Results  Component Value Date   VITAMINB12 541 09/23/2014       Assessment & Plan:   Problem List Items Addressed This Visit    Hypertension - Primary    Chronic, stable. Better control however patient endorses occasional low bp to 90/60 associated with increased fatigue/sleepiness. Discussed ok to try 5mg  lisinopril in am and recommended continue nebivolol 2.5mg  nightly.       History of repair of aneurysm of abdominal aorta using endovascular stent graft    Continues regular f/u with [facility] ([facility]) - rec CT Q108yrs.       Dyslipidemia    Compliant with lipitor 20mg  daily. Continue.       COPD, moderate ([place])    Appreciate pulm care - dyspnea essentially resolved with higher advair dose.       Vitamin B12 deficiency    Decrease B12 orally to 545mcg daily. Check B12 levels next labs       Coronary artery disease involving coronary bypass graft with unspecified angina pectoris    Appreciate cards care of patient. Latest LHC showing 2/3 patent grafts. rec medical management.        Other Visit Diagnoses    Need for zoster vaccination        Relevant Orders    Varicella-zoster vaccine subcutaneous (Completed)        Follow up plan: Return in about 6 months (around 06/23/2016), or as needed, for annual exam, prior fasting for blood work.  [name], MD

## 2016-01-14 ENCOUNTER — Other Ambulatory Visit: Payer: Self-pay | Admitting: *Deleted

## 2016-01-14 DIAGNOSIS — I10 Essential (primary) hypertension: Secondary | ICD-10-CM

## 2016-01-14 MED ORDER — LISINOPRIL 10 MG PO TABS: 10.0000 mg | ORAL_TABLET | Freq: Every day | ORAL | Status: DC

## 2016-01-29 ENCOUNTER — Ambulatory Visit (INDEPENDENT_AMBULATORY_CARE_PROVIDER_SITE_OTHER): Payer: BLUE CROSS/BLUE SHIELD | Admitting: Pulmonary Disease

## 2016-01-29 ENCOUNTER — Encounter: Payer: Self-pay | Admitting: Pulmonary Disease

## 2016-01-29 VITALS — BP 138/80 | HR 57 | Ht 71.0 in | Wt 209.4 lb

## 2016-01-29 DIAGNOSIS — J449 Chronic obstructive pulmonary disease, unspecified: Secondary | ICD-10-CM

## 2016-01-29 NOTE — Progress Notes (Signed)
Subjective:    Patient ID: Carl Lee, male    DOB: [birth year], 60 y.o.   MRN: [id number]  Synopsis: Mr. Lare had a CABG in 2015 and pulmonary critical care medicine was consulted for a COPD exacerbation. July 2015 lung function testing showed a ratio 62%, FEV1 of 2.10 L (56% predicted), total lung capacity 6.35 L (80% predicted). He smoked for many years and quit smoking in 2015. April 2017 pulmonary function testing: FEV1 1.8 L, total lung capacity 99% predicted, DLCO 39% predicted 11/12/2015 TTE > normal LVEF, RV mildly dilated and systolic function mildly reduced  HPI  Chief Complaint  Patient presents with  . Follow-up    pt c/o increased SOB X2 weeks- started after fishing in a tournament.     Infant said that he was breathing better after we increased the dose of his Advair recently. However, after that he said that he went for a fishing [place] and since then he's been having more shortness of breath. His chest congestion is about the same. He wheezes when times a time. He will produce mucus in the mornings. However, he says that ever since spending several days on a boat he's been feeling more short of breath. He continues to have chest tenderness.   Past Medical History  Diagnosis Date  . Bladder cancer ([place]) 08/16/2008  . Hypertension   . AAA (abdominal aortic aneurysm) ([place]) 03/25/12    s/p endovascular repair  . Hx of migraines   . Contact dermatitis     atypical [name])  . Childhood asthma   . Environmental allergies     improved as ages  . Diverticulosis 2013    severe by CT and colonoscopy  . Adenomatous polyp 06/2009  . Lumbosacral radiculopathy at S1 03/2012    left, with spinal stenosis (MRI 04/2013) improved with TF ESI L5/S1 and S1/2 ([facility] [place])  . Spinal stenosis     LS1 nerve root impingement from bulging disc  . Coronary artery disease     a.2015: CABG w/ LIMA-LAD, RIMA-OM, SVG-RCA b. Cath on 07/10/15 w/ patent LIMA-LAD and RIMA-OM. SVG-RCA  occluded but collaterals present  . Zenker diverticulum   . Resistance to clopidogrel 2015    drug metabolism panel run - asked to scan  . OSA (obstructive sleep apnea) 09/04/2014    Severe with AHI 35/hr  . Vitamin B12 deficiency   . Prediabetes 06/08/2014    CAD   . COPD (chronic obstructive pulmonary disease) ([place])   . Diabetes mellitus without complication ([place])     Type II  . Fracture of lateral malleolus of left ankle 08/29/2013  . Hepatic steatosis 06/2015    by [place]     Review of Systems  Constitutional: Positive for fatigue. Negative for fever and chills.  HENT: Negative for postnasal drip, rhinorrhea and sinus pressure.   Respiratory: Positive for cough. Negative for shortness of breath and wheezing.   Cardiovascular: Negative for chest pain, palpitations and leg swelling.       Objective:   Physical Exam Filed Vitals:   01/29/16 0858  BP: 138/80  Pulse: 57  Height: 5\' 11"  (1.803 m)  Weight: 209 lb 6.4 oz (94.983 kg)  SpO2: 95%   RA  Gen: well appearing, no acute distress HEENT: NCAT, EOMi, OP clear,  PULM: CTA B, few wheezes, good air movement CV: RRR, no mgr, no JVD GI: BS+, soft, nontender, MSK: normal bulk and tone Derm: no rash or skin breakdown, no edema Neuro:  A&Ox4, CN II-XII intact, MAEW      Assessment & Plan:   COPD, moderate ([place]) His pulmonary function testing showed a slight decline in FEV1 since his preoperative measurement. I believe that his dyspnea is directly related to his COPD. He has secondary pulmonary hypertension as seen on his echocardiogram, considered World Health Organization group 3 and would not be benefited by pulmonary vasodilator therapy.  Because of worsening symptoms, I think that he needs a trial of an additional antimuscarinic  Plan: Sample Incruise given today Continue Advair considering his history of asthma He was encouraged to exercise more frequently Follow-up 4 months    Updated Medication  List Outpatient Encounter Prescriptions as of 01/29/2016  Medication Sig  . albuterol (PROAIR HFA) 108 (90 BASE) MCG/ACT inhaler Inhale 2 puffs into the lungs every 6 (six) hours as needed for wheezing or shortness of breath.  [name] aspirin EC 325 MG EC tablet Take 1 tablet (325 mg total) by mouth daily.  [name] atorvastatin (LIPITOR) 20 MG tablet TAKE 1 TABLET (20 MG TOTAL) BY MOUTH DAILY AT 6 PM.  . diclofenac sodium (VOLTAREN) 1 % GEL Apply topically 4 (four) times daily. Reported on 09/17/2015  . famotidine (PEPCID) 20 MG tablet Take 20 mg by mouth 2 (two) times daily.  . fluticasone-salmeterol (ADVAIR HFA) 230-21 MCG/ACT inhaler Inhale 2 puffs into the lungs 2 (two) times daily.  [name] HYDROcodone-acetaminophen (NORCO/VICODIN) 5-325 MG tablet Take 1 tablet by mouth every 8 (eight) hours as needed for severe pain.  [name] lisinopril (PRINIVIL,ZESTRIL) 10 MG tablet Take 1 tablet (10 mg total) by mouth daily with breakfast.  . nebivolol (BYSTOLIC) 2.5 MG tablet Take 1 tablet (2.5 mg total) by mouth daily.  [name] nystatin (MYCOSTATIN) 100000 UNIT/ML suspension Take 5 mLs by mouth 4 (four) times daily.  . ranolazine (RANEXA) 1000 MG SR tablet Take 1 tablet (1,000 mg total) by mouth 2 (two) times daily.  . vitamin B-12 (CYANOCOBALAMIN) 500 MCG tablet Take 1 tablet (500 mcg total) by mouth daily.   No facility-administered encounter medications on file as of 01/29/2016.

## 2016-01-29 NOTE — Patient Instructions (Signed)
Tried taking Incruise 1 puff daily in addition to the Advair Call me after taking the Incruise for approximately 2 weeks and let me know if it is working. If it's working then I will call in a prescription Exercise more frequently I will see you back in 4 months or sooner if needed

## 2016-01-29 NOTE — Assessment & Plan Note (Signed)
His pulmonary function testing showed a slight decline in FEV1 since his preoperative measurement. I believe that his dyspnea is directly related to his COPD. He has secondary pulmonary hypertension as seen on his echocardiogram, considered World Health Organization group 3 and would not be benefited by pulmonary vasodilator therapy.  Because of worsening symptoms, I think that he needs a trial of an additional antimuscarinic  Plan: Sample Incruise given today Continue Advair considering his history of asthma He was encouraged to exercise more frequently Follow-up 4 months

## 2016-02-24 ENCOUNTER — Telehealth: Payer: Self-pay | Admitting: Pulmonary Disease

## 2016-02-24 ENCOUNTER — Encounter: Payer: Self-pay | Admitting: Internal Medicine

## 2016-02-24 ENCOUNTER — Ambulatory Visit (INDEPENDENT_AMBULATORY_CARE_PROVIDER_SITE_OTHER): Payer: BLUE CROSS/BLUE SHIELD | Admitting: Internal Medicine

## 2016-02-24 ENCOUNTER — Encounter: Payer: Self-pay | Admitting: Pulmonary Disease

## 2016-02-24 VITALS — BP 120/68 | HR 72 | Temp 98.9°F | Wt 211.0 lb

## 2016-02-24 DIAGNOSIS — J029 Acute pharyngitis, unspecified: Secondary | ICD-10-CM | POA: Diagnosis not present

## 2016-02-24 DIAGNOSIS — R51 Headache: Secondary | ICD-10-CM | POA: Diagnosis not present

## 2016-02-24 DIAGNOSIS — R519 Headache, unspecified: Secondary | ICD-10-CM

## 2016-02-24 DIAGNOSIS — T63461A Toxic effect of venom of wasps, accidental (unintentional), initial encounter: Secondary | ICD-10-CM | POA: Diagnosis not present

## 2016-02-24 DIAGNOSIS — R002 Palpitations: Secondary | ICD-10-CM

## 2016-02-24 MED ORDER — UMECLIDINIUM BROMIDE 62.5 MCG/INH IN AEPB: 1.0000 | INHALATION_SPRAY | Freq: Every day | RESPIRATORY_TRACT | 5 refills | Status: DC

## 2016-02-24 NOTE — Progress Notes (Signed)
Subjective:    Patient ID: Carl Lee, male    DOB: 11/22/1955, 60 y.o.   MRN: [id number]  HPI  Pt reports to the clinic today with a complaint of a wasp sting on his right ankle 4 weeks ago.  He reports the sting was red and swollen at first, and has now darkened in color over the past week.  He also reports some soreness and swelling x 2 days. He has been putting Benadryl gel on it with some relief.  He denies decreased range of motion.  He also complains of an irregular heartbeat that started 2 weeks ago.  He reports he "just wasn't feeling good" so he took his blood pressure at home and saw an irregular heartbeat on the machine that he described as beating twice and then skipping a beat.  He states this lasted for 2 hours.  He reports this has happened before, but has not happened for this long of a period of time.  He admits to a history of bypass surgery and angina, and states it is hard to tell if he is having chest pain due to his history.  He reports shortness of breath due to his history of COPD.  He has an appointment scheduled with a Cardiologist in September.    He also complains of a sore throat and hoarseness x 2 days.  He admits to drainage down the back of the throat.  He also admits to trouble swallowing that has been worsening over the past 2-3 weeks, and reports a history of Zenker's diverticulum.  He denies nasal congestion, fevers, and chills.  He admits to a headache over the past 2 days on the left side of his head that he rates at a severity of 6/10.  He denies vision changes, nausea, vomiting, photophobia, or phonophobia.  He reports a history of migraines, and states these headaches felt different.  He has tried Tylenol with relief.  He does not have a headache today.     Review of Systems Past Medical History:  Diagnosis Date  . AAA (abdominal aortic aneurysm) ([place]) 03/25/12   s/p endovascular repair  . Adenomatous polyp 06/2009  . Bladder cancer ([place]) 08/16/2008    . Childhood asthma   . Contact dermatitis    atypical [name])  . COPD (chronic obstructive pulmonary disease) ([place])   . Coronary artery disease    a.2015: CABG w/ LIMA-LAD, RIMA-OM, SVG-RCA b. Cath on 07/10/15 w/ patent LIMA-LAD and RIMA-OM. SVG-RCA occluded but collaterals present  . Diabetes mellitus without complication ([place])    Type II  . Diverticulosis 2013   severe by CT and colonoscopy  . Environmental allergies    improved as ages  . Fracture of lateral malleolus of left ankle 08/29/2013  . Hepatic steatosis 06/2015   by [place]  . Hx of migraines   . Hypertension   . Lumbosacral radiculopathy at S1 03/2012   left, with spinal stenosis (MRI 04/2013) improved with TF ESI L5/S1 and S1/2 ([facility])  . OSA (obstructive sleep apnea) 09/04/2014   Severe with AHI 35/hr  . Prediabetes 06/08/2014   CAD   . Resistance to clopidogrel 2015   drug metabolism panel run - asked to scan  . Spinal stenosis    LS1 nerve root impingement from bulging disc  . Vitamin B12 deficiency   . Zenker diverticulum    Past Surgical History:  Procedure Laterality Date  . ABDOMINAL AORTIC ANEURYSM REPAIR  03/25/12   endovascular  .  ABI  05/2013   WNL, L TBI low at 0.66  . Bladder cancer  March 2010 and Oct. 2015   [facility]) x 2  . CARDIAC CATHETERIZATION N/A 07/10/2015   Procedure: Left Heart Cath and Cors/Grafts Angiography;  Surgeon: [name], MD;  Location: [facility];  Service: Cardiovascular;  Laterality: N/A;  . COLONOSCOPY  06/2012   hyperplastic polyp, diverticulosis ([facility]) rec rpt 5 yrs  . CORONARY ARTERY BYPASS GRAFT N/A 02/07/2014   Procedure: CORONARY ARTERY BYPASS GRAFTING (CABG);  Surgeon: [name], MD;  Location: [facility];  Service: Open Heart Surgery;  Laterality: N/A;  CABG X 3, BILATERAL LIMA, EVH  . CYSTOSCOPY  08/16/08   Bladder Cancer  . EPIDURAL BLOCK INJECTION Left 09/2014, 10/2014, 12/2014   medial L2,3,4, dorsal L5 ramus blocks x2, L L5/S1  and S1/2 transforaminal ESI ([facility] [place])  . ESI  04/2013, 06/2013   L L5S1, S12 transforaminal ESI (Dr.  [name])  . ESI Left 04/2014, 05/2014, 06/2014   L5/S1, S1/2; rpt; L4/5  . INTRAOPERATIVE TRANSESOPHAGEAL ECHOCARDIOGRAM N/A 02/07/2014   Procedure: INTRAOPERATIVE TRANSESOPHAGEAL ECHOCARDIOGRAM;  Surgeon: [name], MD;  Location: [facility];  Service: Open Heart Surgery;  Laterality: N/A;  . LEFT HEART CATHETERIZATION WITH CORONARY ANGIOGRAM N/A 02/01/2014   Procedure: LEFT HEART CATHETERIZATION WITH CORONARY ANGIOGRAM;  Surgeon: [name], MD;  Location[facility];  Service: Cardiovascular;  Laterality: N/A;  . PRP epidural injection Left 11/2015   L5/S1, S1/2 transforaminal epidural PRP injections under fluoroscopy ([facility])  . TONSILLECTOMY AND ADENOIDECTOMY  1973  . VASECTOMY     Family History  Problem Relation Age of Onset  . Diabetes Mother   . Arrhythmia Mother     pacemaker  . Cancer Mother     Bladder  . COPD Mother   . Thyroid disease Mother   . Hyperlipidemia Mother   . Hypertension Mother   . Diabetes Father   . CAD Father 48    CHF, MI  . Dementia Father   . Heart attack Father   . Diabetes Sister   . Diabetes Brother   . Hypertension Brother   . Heart attack Maternal Grandmother   . Colon cancer Neg Hx    Current Outpatient Prescriptions on File Prior to Visit  Medication Sig Dispense Refill  . albuterol (PROAIR HFA) 108 (90 BASE) MCG/ACT inhaler Inhale 2 puffs into the lungs every 6 (six) hours as needed for wheezing or shortness of breath. 1 Inhaler 2  . aspirin EC 325 MG EC tablet Take 1 tablet (325 mg total) by mouth daily.    [name] atorvastatin (LIPITOR) 20 MG tablet TAKE 1 TABLET (20 MG TOTAL) BY MOUTH DAILY AT 6 PM. 90 tablet 3  . diclofenac sodium (VOLTAREN) 1 % GEL Apply topically 4 (four) times daily. Reported on 09/17/2015    . famotidine (PEPCID) 20 MG tablet Take 20 mg by mouth 2 (two) times daily.  3  .  fluticasone-salmeterol (ADVAIR HFA) 230-21 MCG/ACT inhaler Inhale 2 puffs into the lungs 2 (two) times daily. 1 Inhaler 12  . HYDROcodone-acetaminophen (NORCO/VICODIN) 5-325 MG tablet Take 1 tablet by mouth every 8 (eight) hours as needed for severe pain. 30 tablet 0  . lisinopril (PRINIVIL,ZESTRIL) 10 MG tablet Take 1 tablet (10 mg total) by mouth daily with breakfast. 90 tablet 2  . nebivolol (BYSTOLIC) 2.5 MG tablet Take 1 tablet (2.5 mg total) by mouth daily. 30 tablet 11  . ranolazine (  RANEXA) 1000 MG SR tablet Take 1 tablet (1,000 mg total) by mouth 2 (two) times daily. 60 tablet 11  . vitamin B-12 (CYANOCOBALAMIN) 500 MCG tablet Take 1 tablet (500 mcg total) by mouth daily.     No current facility-administered medications on file prior to visit.    Allergies  Allergen Reactions  . Codeine Other (See Comments)    Nightmares  . Doxycycline Hives    mild    Const: Pt reports headaches. Denies fevers and chills. Skin: Pt reports wasp sting, darkening of skin, and swelling. HEENT: Pt reports sore throat, drainage down the back of the throat, and trouble swallowing.  Denies nasal congestion. Pulm: Pt reports SOB. CV: Pt reports irregular heartbeat. MSK: Denies decreased range of motion of ankles.     Objective:   Physical Exam  BP 120/68   Pulse 72   Temp 98.9 F (37.2 C) (Oral)   Wt 211 lb (95.7 kg)   BMI 29.43 kg/m   General: Well-appearing, in no acute distress. Skin: 1 cm x 2 cm raised hyperpigmented area right lateral ankle, no erythema or discharge noted. HEENT: No scleral icterus or conjunctival injection.  TMs pearly grey.  No nasal erythema or discharge.  Slight erythema of pharynx, no exudates noted.  Sinuses nontender to palpation. Pulm: Clear to auscultation bilaterally.  No wheezes, rales, or rhonchi. CV: Regular rate and rhythm.  No murmurs, rubs, or gallops.   MSK: Full AROM bilateral ankles.  Tenderness to palpation at right anterior and lateral ankle.  Right  foot nontender.  No swelling or warmth to the touch noted.  Strength 5/5 BLE.        Assessment & Plan:   Wasp sting:  No s/s of infection Try Neosporin instead of Benadryl  Sore throat:  Try Zyrtec  Tylenol as needed for pain Discussed referral to GI for difficulty swallowing, pt declined at this time  Palpitations:  Regular rate and rhythm auscultated today Follow-up with Cardiologist next month as scheduled  Call if symptoms worsen or do not resolve BAITY, REGINA, NP

## 2016-02-24 NOTE — Telephone Encounter (Signed)
Called and spoke with pt and he is aware of TP recs.  He requested that the rx for the incruse be sent to his pharmacy and this has been done.  Pt voiced his understanding to stop the incruse if the heart racing came back.  He will keep the appt with BQ and cardiology in September.

## 2016-02-24 NOTE — Telephone Encounter (Signed)
Spoke with pt who states he was given a sample of incruse at his last OV on 01/29/16 and was to call us back in 2 weeks if improving.  Pt feels incruse is helping however he did have an irregular heartbeat last week that lasted for about an hour, he has contacted his cardiologist and the earliest he could get in his mid September. Pt had been taking incruse for about 2 weeks prior to the irregular heartbeat.  TP please advise as BQ is on vacation. Thanks.

## 2016-02-24 NOTE — Progress Notes (Signed)
Patient ID: Carl Lee, male    DOB: 1955-08-10, 60 y.o.   MRN: [id number]  HPI  Pt reports to the clinic today with a complaint of a wasp sting on his right ankle 4 weeks ago.  He reports the sting was red and swollen at first, and has now darkened in color over the past week.  He also reports some soreness and swelling x 2 days. He has been putting Benadryl gel on it with some relief.  He denies decreased range of motion.  He also complains of an irregular heartbeat that started 2 weeks ago.  He reports he "just wasn't feeling good" so he took his blood pressure at home and saw an irregular heartbeat on the machine that he described as beating twice and then skipping a beat.  He states this lasted for 2 hours.  He reports this has happened before, but has not happened for this long of a period of time.  He admits to a history of bypass surgery and angina, and states it is hard to tell if he is having chest pain due to his history.  He reports shortness of breath due to his history of COPD.  He has an appointment scheduled with a Cardiologist in September.    He also complains of a sore throat and hoarseness x 2 days.  He admits to drainage down the back of the throat.  He also admits to trouble swallowing that has been worsening over the past 2-3 weeks, and reports a history of Zenker's diverticulum.  He denies nasal congestion, fevers, and chills.  He admits to a headache over the past 2 days on the left side of his head that he rates at a severity of 6/10.  He denies vision changes, nausea, vomiting, photophobia, or phonophobia.  He reports a history of migraines, and states these headaches felt different.  He has tried Tylenol with relief.  He does not have a headache today.     Review of Systems Past Medical History:  Diagnosis Date  . AAA (abdominal aortic aneurysm) ([place]) 03/25/12   s/p endovascular repair  . Adenomatous polyp 06/2009  . Bladder cancer ([place]) 08/16/2008  . Childhood asthma    . Contact dermatitis    atypical [name])  . COPD (chronic obstructive pulmonary disease) ([place])   . Coronary artery disease    a.2015: CABG w/ LIMA-LAD, RIMA-OM, SVG-RCA b. Cath on 07/10/15 w/ patent LIMA-LAD and RIMA-OM. SVG-RCA occluded but collaterals present  . Diabetes mellitus without complication ([place])    Type II  . Diverticulosis 2013   severe by CT and colonoscopy  . Environmental allergies    improved as ages  . Fracture of lateral malleolus of left ankle 08/29/2013  . Hepatic steatosis 06/2015   by [place]  . Hx of migraines   . Hypertension   . Lumbosacral radiculopathy at S1 03/2012   left, with spinal stenosis (MRI 04/2013) improved with TF ESI L5/S1 and S1/2 (Dalton Bells)  . OSA (obstructive sleep apnea) 09/04/2014   Severe with AHI 35/hr  . Prediabetes 06/08/2014   CAD   . Resistance to clopidogrel 2015   drug metabolism panel run - asked to scan  . Spinal stenosis    LS1 nerve root impingement from bulging disc  . Vitamin B12 deficiency   . Zenker diverticulum    Past Surgical History:  Procedure Laterality Date  . ABDOMINAL AORTIC ANEURYSM REPAIR  03/25/12   endovascular  . ABI  05/2013  WNL, L TBI low at 0.66  . Bladder cancer  March 2010 and Oct. 2015   Ernst Spell) x 2  . CARDIAC CATHETERIZATION N/A 07/10/2015   Procedure: Left Heart Cath and Cors/Grafts Angiography;  Surgeon: [name], MD;  Location: [facility];  Service: Cardiovascular;  Laterality: N/A;  . COLONOSCOPY  06/2012   hyperplastic polyp, diverticulosis (jacobs) rec rpt 5 yrs  . CORONARY ARTERY BYPASS GRAFT N/A 02/07/2014   Procedure: CORONARY ARTERY BYPASS GRAFTING (CABG);  Surgeon: [name], MD;  Location: [facility];  Service: Open Heart Surgery;  Laterality: N/A;  CABG X 3, BILATERAL LIMA, EVH  . CYSTOSCOPY  08/16/08   Bladder Cancer  . EPIDURAL BLOCK INJECTION Left 09/2014, 10/2014, 12/2014   medial L2,3,4, dorsal L5 ramus blocks x2, L L5/S1 and S1/2  transforaminal ESI (Dalton Los Alvarez)  . ESI  04/2013, 06/2013   L L5S1, S12 transforaminal ESI (Dr.  [name])  . ESI Left 04/2014, 05/2014, 06/2014   L5/S1, S1/2; rpt; L4/5  . INTRAOPERATIVE TRANSESOPHAGEAL ECHOCARDIOGRAM N/A 02/07/2014   Procedure: INTRAOPERATIVE TRANSESOPHAGEAL ECHOCARDIOGRAM;  Surgeon: [name], MD;  Location: [facility];  Service: Open Heart Surgery;  Laterality: N/A;  . LEFT HEART CATHETERIZATION WITH CORONARY ANGIOGRAM N/A 02/01/2014   Procedure: LEFT HEART CATHETERIZATION WITH CORONARY ANGIOGRAM;  Surgeon: [name], MD;  Location: [facility];  Service: Cardiovascular;  Laterality: N/A;  . PRP epidural injection Left 11/2015   L5/S1, S1/2 transforaminal epidural PRP injections under fluoroscopy (Dalton-Bethea)  . TONSILLECTOMY AND ADENOIDECTOMY  1973  . VASECTOMY     Family History  Problem Relation Age of Onset  . Diabetes Mother   . Arrhythmia Mother     pacemaker  . Cancer Mother     Bladder  . COPD Mother   . Thyroid disease Mother   . Hyperlipidemia Mother   . Hypertension Mother   . Diabetes Father   . CAD Father 63    CHF, MI  . Dementia Father   . Heart attack Father   . Diabetes Sister   . Diabetes Brother   . Hypertension Brother   . Heart attack Maternal Grandmother   . Colon cancer Neg Hx    Current Outpatient Prescriptions on File Prior to Visit  Medication Sig Dispense Refill  . albuterol (PROAIR HFA) 108 (90 BASE) MCG/ACT inhaler Inhale 2 puffs into the lungs every 6 (six) hours as needed for wheezing or shortness of breath. 1 Inhaler 2  . aspirin EC 325 MG EC tablet Take 1 tablet (325 mg total) by mouth daily.    [name] atorvastatin (LIPITOR) 20 MG tablet TAKE 1 TABLET (20 MG TOTAL) BY MOUTH DAILY AT 6 PM. 90 tablet 3  . diclofenac sodium (VOLTAREN) 1 % GEL Apply topically 4 (four) times daily. Reported on 09/17/2015    . famotidine (PEPCID) 20 MG tablet Take 20 mg by mouth 2 (two) times daily.  3  .  fluticasone-salmeterol (ADVAIR HFA) 230-21 MCG/ACT inhaler Inhale 2 puffs into the lungs 2 (two) times daily. 1 Inhaler 12  . HYDROcodone-acetaminophen (NORCO/VICODIN) 5-325 MG tablet Take 1 tablet by mouth every 8 (eight) hours as needed for severe pain. 30 tablet 0  . lisinopril (PRINIVIL,ZESTRIL) 10 MG tablet Take 1 tablet (10 mg total) by mouth daily with breakfast. 90 tablet 2  . nebivolol (BYSTOLIC) 2.5 MG tablet Take 1 tablet (2.5 mg total) by mouth daily. 30 tablet 11  . ranolazine (RANEXA) 1000 MG SR tablet  Take 1 tablet (1,000 mg total) by mouth 2 (two) times daily. 60 tablet 11  . vitamin B-12 (CYANOCOBALAMIN) 500 MCG tablet Take 1 tablet (500 mcg total) by mouth daily.     No current facility-administered medications on file prior to visit.    Allergies  Allergen Reactions  . Codeine Other (See Comments)    Nightmares  . Doxycycline Hives    mild    Const: Pt reports headaches. Denies fevers and chills. Skin: Pt reports wasp sting, darkening of skin, and swelling. HEENT: Pt reports sore throat, drainage down the back of the throat, and trouble swallowing.  Denies nasal congestion. Pulm: Pt reports SOB. CV: Pt reports irregular heartbeat. MSK: Denies decreased range of motion of ankles.     Objective:   Physical Exam  BP 120/68   Pulse 72   Temp 98.9 F (37.2 C) (Oral)   Wt 211 lb (95.7 kg)   BMI 29.43 kg/m   General: Well-appearing, in no acute distress. Skin: 1 cm x 2 cm raised hyperpigmented area right lateral ankle, no erythema or discharge noted. HEENT: No scleral icterus or conjunctival injection.  TMs pearly grey.  No nasal erythema or discharge.  Slight erythema of pharynx, no exudates noted.  Sinuses nontender to palpation. Pulm: Clear to auscultation bilaterally.  No wheezes, rales, or rhonchi. CV: Regular rate and rhythm.  No murmurs, rubs, or gallops.   MSK: Full AROM bilateral ankles.  Tenderness to palpation at right anterior and lateral ankle.  Right  foot nontender.  No swelling or warmth to the touch noted.  Strength 5/5 BLE.        Assessment & Plan:   Wasp sting:  No s/s of infection Try Neosporin instead of Benadryl  Sore throat:  Try Zyrtec  Tylenol as needed for pain Discussed referral to GI for difficulty swallowing, pt declined at this time  Palpitations:  Regular rate and rhythm auscultated today Follow-up with Cardiologist next month as scheduled  Call if symptoms worsen or do not resolve BAITY, REGINA, NP

## 2016-02-24 NOTE — Telephone Encounter (Signed)
If the irregular heartbeat does not occur should be okay to continue .  Incruse and similar meds can cause faster heart rates . So need to keep an eye on this.  If irregular HR returns stop Incruse and will need to be seen by Cards or ER.  Please contact office for sooner follow up if symptoms do not improve or worsen or seek emergency care  Keep follow up with Dr. [name] .

## 2016-02-24 NOTE — Patient Instructions (Signed)
Bee, Wasp, or Hornet Sting °Bees, wasps, and hornets are part of a family of insects that can sting people. These stings can cause pain and inflammation, but they are usually not serious. However, some people may have an allergic reaction to a sting. This can cause the symptoms to be more severe.  °SYMPTOMS  °Common symptoms of this condition include:  °· A red lump in the skin that sometimes has a tiny hole in the center. In some cases, a stinger may be in the center of the wound. °· Pain and itching at the sting site. °· Redness and swelling around the sting site. If you have an allergic reaction (localized allergic reaction), the swelling and redness may spread out from the sting site. In some cases, this reaction can continue to develop over the next 12-36 hours. °In rare cases, a person may have a severe allergic reaction (anaphylactic reaction) to a sting. Symptoms of an anaphylactic reaction may include:  °· Wheezing or difficulty breathing. °· Raised, itchy, red patches on the skin. °· Nausea or vomiting. °· Abdominal cramping. °· Diarrhea. °· Chest pain. °· Fainting. °· Redness of the face (flushing). °DIAGNOSIS  °This condition is usually diagnosed based on symptoms, medical history, and a physical exam. °TREATMENT  °Most stings can be treated with:  °· Icing to reduce swelling. °· Medicines (antihistamines) to treat itching or an allergic reaction. °· Medicines to help reduce pain. These may be medicines that you take by mouth, or medicated creams or lotions that you apply to your skin. °If you were stung by a bee, the stinger and a small sac of poison may be in the wound. This may be removed by brushing across it with a flat card, such as a credit card. Another method is to pinch the area and pull it out. These methods can help reduce the severity of the body's reaction to the sting.  °HOME CARE INSTRUCTIONS  °· Wash the sting site daily with soap and water as told by your health care provider. °· Apply  or take over-the-counter and prescription medicines only as told by your health care provider. °· If directed, apply ice to the sting area. °¨  Put ice in a plastic bag. °¨  Place a towel between your skin and the bag. °¨  Leave the ice on for 20 minutes, 2-3 times per day. °· Do not scratch the sting area. °· To lessen pain, try using a paste that is made of water and baking soda. Rub the paste on the sting area and leave it on for 5 minutes. °· If you had a severe allergic reaction to a sting, you may need: °¨  To wear a medical bracelet or necklace that lists the allergy. °¨  To learn when and how to use an anaphylaxis kit or epinephrine injection. Your family members may also need to learn this. °¨  To carry an anaphylaxis kit with you at all times. °SEEK MEDICAL CARE IF:  °· Your symptoms do not get better in 2-3 days. °· You have redness, swelling, or pain that spreads beyond the area of the sting. °· You have a fever. °SEEK IMMEDIATE MEDICAL CARE IF:  °You have symptoms of a severe allergic reaction. These include:  °· Wheezing or difficulty breathing. °· Chest pain. °· Light-headedness or fainting. °· Itchy, raised, red patches on the skin. °· Nausea or vomiting. °· Abdominal cramping. °· Diarrhea. °  °This information is not intended to replace advice given to you by your health care provider.   Make sure you discuss any questions you have with your health care provider. °  °Document Released: 06/28/2005 Document Revised: 03/19/2015 Document Reviewed: 11/13/2014 °[employer]vier Interactive Patient Education ©2016 [employer] ° °

## 2016-02-26 ENCOUNTER — Encounter: Payer: Self-pay | Admitting: Cardiovascular Disease

## 2016-03-04 ENCOUNTER — Encounter: Payer: Self-pay | Admitting: Physician Assistant

## 2016-03-12 HISTORY — PX: OTHER SURGICAL HISTORY: SHX169

## 2016-03-18 NOTE — Progress Notes (Signed)
Cardiology Office Note:    Date:  03/19/2016   ID:  Carl Lee, DOB 10/10/1955, MRN [id number]  PCP:  [name], MD  Cardiologist:  Dr. [name]   Electrophysiologist:  N/a Pulmonology: Dr. [name] GI: Dr. Ardis Hughs  Referring MD: [name], MD   Chief Complaint  Patient presents with  . Follow-up    CAD  . Chest Pain    History of Present Illness:    Carl Lee is a 60 y.o. male with a hx of CAD s/p CABG in 2015 with Dr. [name] (L-LAD, RIMA-OM, S-PDA), recurrent bladder CA, carotid artery disease, AAA s/p endovascular repair in 9/13 by Dr. [name], HTN, HL, DM2, OSA, tobacco abuse.  He  Myoview in 12/16 was intermediate risk with anterior, anteroseptal and apical scar with peri-infarct ischemia.  LHC demonstrated severe native 3 vessel CAD with patent LIMA-LAD and patent free RIMA-OM3. Native RCA was occluded and filled distally from left to right collaterals. SVG-RCA was occluded. Medical therapy was recommended.    Since his CABG, he has continued to complain of fatigue and chronic chest pain.  I put him on Ranolazine in 1/17 as some of his symptoms may have been related to his occluded RCA (S-RCA occluded).  Last seen by Dr. [name] in 3/17. 24 hour ambulatory blood pressure monitor in 4/17 demonstrated normal average blood pressure.  He is also followed by pulmonology for moderate COPD. Last seen in 7/17.  Returns for follow-up. He is here today with his wife.  He has several complaints.  He continues to have chronic chest pain since his CABG.  He notes L sided CP over the past 2-3 mos.  It is constant and worse with deep breathing or palpation.  Denies syncope or near syncope.  He has occasional episodes of increasing DOE.  He was told that his heart it weak.  I reviewed his Echo with him today.  His EF is normal.  His RVF is reduced and is likely related to his COPD.  He denies orthopnea, PND, edema.  He denies any bleeding issues.  He is fatigued.  This is  also a chronic complaint.  He notes worsening fatigue when his BP is low.  He has a lot of readings in the [id number] systolic range.  He denies syncope.  He also notes palpitations.  This is described as a skipped beat.  He denies rapid palpitations.  He detected this only by palpating his pulse.     Prior CV studies that were reviewed today include:    Echo 11/11/15 Vigorous LVF, EF 65-70%, no RWMA, Gr 1 DD, reduced RVSF, PASP 24 mmHg  24 Hr BP Monitor 4/17 Avg BP in normal range  LHC 07/10/15 LAD 100% LCx ostial 60%, OM1 40%, OM3 40% RCA 100% - dist RCA fills form L-R collats S-RCA 100% L-LAD ok Free RIMA-OM3 ok EF normal Med Rx  Myoview 06/25/15 Anterior, anteroseptal, apical scar with peri-infarct ischemia, EF 60%, intermediate risk  Abd/Pelvic CTA 11/15 Patent bilateral renal arteries.  LHC (7/15):  pLAD 30 then 100 (L-L collats), pCFX 80, mCFX 50, pRCA 100 (R-R and L-R collats), EF 50%, ant HK and poss LV apical aneurysm  Nuclear (7/15): Inferior and anteroseptal, mid and dist anterior and dist inferior and apical fixed defect, no ischemia, EF 49%; High Risk  Carotid US (7/15):  Bilateral ICA 1-39%  Past Medical History:  Diagnosis Date  . AAA (abdominal aortic aneurysm) ([place]) 03/25/12   s/p endovascular repair  .  Adenomatous polyp 06/2009  . Bladder cancer ([place]) 08/16/2008  . Childhood asthma   . Contact dermatitis    atypical [name])  . COPD (chronic obstructive pulmonary disease) ([place])   . Coronary artery disease    a.2015: CABG w/ LIMA-LAD, RIMA-OM, SVG-RCA b. Cath on 07/10/15 w/ patent LIMA-LAD and RIMA-OM. SVG-RCA occluded but collaterals present  . Diabetes mellitus without complication ([place])    Type II  . Diverticulosis 2013   severe by CT and colonoscopy  . Environmental allergies    improved as ages  . Fracture of lateral malleolus of left ankle 08/29/2013  . Hepatic steatosis 06/2015   by [place]  . Hx of migraines   . Hypertension   .  Lumbosacral radiculopathy at S1 03/2012   left, with spinal stenosis (MRI 04/2013) improved with TF ESI L5/S1 and S1/2 ([name])  . OSA (obstructive sleep apnea) 09/04/2014   Severe with AHI 35/hr  . Prediabetes 06/08/2014   CAD   . Resistance to clopidogrel 2015   drug metabolism panel run - asked to scan  . Spinal stenosis    LS1 nerve root impingement from bulging disc  . Vitamin B12 deficiency   . Zenker diverticulum     Past Surgical History:  Procedure Laterality Date  . ABDOMINAL AORTIC ANEURYSM REPAIR  03/25/12   endovascular  . ABI  05/2013   WNL, L TBI low at 0.66  . Bladder cancer  March 2010 and Oct. 2015   Ernst Spell) x 2  . CARDIAC CATHETERIZATION N/A 07/10/2015   Procedure: Left Heart Cath and Cors/Grafts Angiography;  Surgeon: [name], MD;  Location: [facility];  Service: Cardiovascular;  Laterality: N/A;  . COLONOSCOPY  06/2012   hyperplastic polyp, diverticulosis (jacobs) rec rpt 5 yrs  . CORONARY ARTERY BYPASS GRAFT N/A 02/07/2014   Procedure: CORONARY ARTERY BYPASS GRAFTING (CABG);  Surgeon: [name], MD;  Location: [facility];  Service: Open Heart Surgery;  Laterality: N/A;  CABG X 3, BILATERAL LIMA, EVH  . CYSTOSCOPY  08/16/08   Bladder Cancer  . EPIDURAL BLOCK INJECTION Left 09/2014, 10/2014, 12/2014   medial L2,3,4, dorsal L5 ramus blocks x2, L L5/S1 and S1/2 transforaminal ESI ([facility] [place])  . ESI  04/2013, 06/2013   L L5S1, S12 transforaminal ESI (Dr.  [name])  . ESI Left 04/2014, 05/2014, 06/2014   L5/S1, S1/2; rpt; L4/5  . INTRAOPERATIVE TRANSESOPHAGEAL ECHOCARDIOGRAM N/A 02/07/2014   Procedure: INTRAOPERATIVE TRANSESOPHAGEAL ECHOCARDIOGRAM;  Surgeon: [name], MD;  Location: [facility];  Service: Open Heart Surgery;  Laterality: N/A;  . LEFT HEART CATHETERIZATION WITH CORONARY ANGIOGRAM N/A 02/01/2014   Procedure: LEFT HEART CATHETERIZATION WITH CORONARY ANGIOGRAM;  Surgeon: [name], MD;   Location: [facility];  Service: Cardiovascular;  Laterality: N/A;  . PRP epidural injection Left 11/2015   L5/S1, S1/2 transforaminal epidural PRP injections under fluoroscopy ([place])  . TONSILLECTOMY AND ADENOIDECTOMY  1973  . VASECTOMY      Current Medications: Current Meds  Medication Sig  . albuterol (PROAIR HFA) 108 (90 BASE) MCG/ACT inhaler Inhale 2 puffs into the lungs every 6 (six) hours as needed for wheezing or shortness of breath.  [name] aspirin EC 325 MG EC tablet Take 1 tablet (325 mg total) by mouth daily.  [name] atorvastatin (LIPITOR) 20 MG tablet TAKE 1 TABLET (20 MG TOTAL) BY MOUTH DAILY AT 6 PM.  . diclofenac sodium (VOLTAREN) 1 % GEL Apply topically 4 (four) times daily as needed.  Use as directed  . famotidine (PEPCID) 20 MG tablet Take 20 mg by mouth 2 (two) times daily.  . fluticasone-salmeterol (ADVAIR HFA) 230-21 MCG/ACT inhaler Inhale 2 puffs into the lungs 2 (two) times daily.  [name] HYDROcodone-acetaminophen (NORCO/VICODIN) 5-325 MG tablet Take 1 tablet by mouth every 8 (eight) hours as needed for severe pain.  [name] lisinopril (PRINIVIL,ZESTRIL) 5 MG tablet Take 1 tablet (5 mg total) by mouth daily with breakfast.  . nebivolol (BYSTOLIC) 2.5 MG tablet Take 1 tablet (2.5 mg total) by mouth daily.  [name] nystatin (MYCOSTATIN) 100000 UNIT/ML suspension Use 5 mL as directed four times a day  . ranolazine (RANEXA) 1000 MG SR tablet Take 1 tablet (1,000 mg total) by mouth 2 (two) times daily.  [name] umeclidinium bromide (INCRUSE ELLIPTA) 62.5 MCG/INH AEPB Inhale 1 puff into the lungs daily.  . vitamin B-12 (CYANOCOBALAMIN) 500 MCG tablet Take 1 tablet (500 mcg total) by mouth daily.  . [DISCONTINUED] lisinopril (PRINIVIL,ZESTRIL) 10 MG tablet Take 1 tablet (10 mg total) by mouth daily with breakfast.     Allergies:   Codeine and Doxycycline   Social History   Social History  . Marital status: Married    Spouse name: N/A  . Number of children: 2  . Years of education: N/A    Occupational History  . Self Employed    Social History Main Topics  . Smoking status: Former Smoker    Packs/day: 1.00    Years: 40.00    Types: Cigarettes    Quit date: 02/05/2014  . Smokeless tobacco: Never Used  . Alcohol use 0.0 oz/week     Comment: Rare- states 1 oz. per week  . Drug use: No  . Sexual activity: Not Asked   Other Topics Concern  . None   Social History Narrative   Caffeine: 1 cup coffee/day   Lives with wife, 1 dog   grown children   Occupation: Therapist, sports - self employed.  Disability after CABG   Edu: 2 yrs college   Activity: fishing, walking occasionally   Diet: good water, fruits/vegetables daily     Family History:  The patient's family history includes Arrhythmia in his mother; CAD (age of onset: 69) in his father; COPD in his mother; Cancer in his mother; Dementia in his father; Diabetes in his brother, father, mother, and sister; Heart attack in his father and maternal grandmother; Hyperlipidemia in his mother; Hypertension in his brother and mother; Thyroid disease in his mother.   ROS:   Please see the history of present illness.    Review of Systems  Constitution: Positive for malaise/fatigue.  Eyes: Positive for visual disturbance.  Cardiovascular: Positive for chest pain, dyspnea on exertion and irregular heartbeat.  Respiratory: Positive for wheezing.   Hematologic/Lymphatic: Bruises/bleeds easily.  Skin: Positive for rash.  Musculoskeletal: Positive for back pain.   All other systems reviewed and are negative.   EKGs/Labs/Other Test Reviewed:    EKG:  EKG is  ordered today.  The ekg ordered today demonstrates sinus brady, HR 51, normal axis, septal Q waves, QTc 383 ms, no changes.   Recent Labs: 07/09/2015: BUN 20; Potassium 4.0; Sodium 140 08/27/2015: Hemoglobin 16.2; Platelets 224 11/28/2015: Creatinine, Ser 0.90   Recent Lipid Panel    Component Value Date/Time   CHOL 97 06/20/2015 0910   CHOL 170 01/05/2012    TRIG 83.0 06/20/2015 0910   TRIG 141 01/05/2012   HDL 34.20 (L) 06/20/2015 0910   CHOLHDL 3 06/20/2015 0910  VLDL 16.6 06/20/2015 0910   LDLCALC 46 06/20/2015 0910   LDLDIRECT 105 01/05/2012     Physical Exam:    VS:  BP 100/64   Pulse (!) 51   Ht 5\' 11"  (1.803 m)   Wt 206 lb (93.4 kg)   SpO2 96%   BMI 28.73 kg/m     Wt Readings from Last 3 Encounters:  03/19/16 206 lb (93.4 kg)  02/24/16 211 lb (95.7 kg)  01/29/16 209 lb 6.4 oz (95 kg)     Physical Exam  Constitutional: He is oriented to person, place, and time. He appears well-developed and well-nourished. No distress.  HENT:  Head: Normocephalic and atraumatic.  Eyes: No scleral icterus.  Neck: Normal range of motion. No JVD present.  Cardiovascular: Normal rate, regular rhythm, S1 normal and S2 normal.  Exam reveals no gallop and no friction rub.   No murmur heard. Pulmonary/Chest: Effort normal and breath sounds normal. He has no decreased breath sounds. He has no wheezes. He has no rhonchi. He has no rales. He exhibits tenderness.  Abdominal: Soft. There is no tenderness.  Musculoskeletal: He exhibits no edema.  Neurological: He is alert and oriented to person, place, and time.  Skin: Skin is warm and dry.  Psychiatric: He has a normal mood and affect.    ASSESSMENT:    1. Other chest pain   2. Coronary artery disease   3. Essential hypertension   4. Dyslipidemia   5. History of repair of aneurysm of abdominal aorta using endovascular stent graft   6. Other fatigue   7. Palpitations   8. Zenker's diverticulum    PLAN:    In order of problems listed above:  1. Chest pain - He has had chronic chest symptoms since his bypass surgery. His chest is tender to palpation today. He did have a different type of chest pain previously that did respond to ranolazine. At this point, I suspect his chest discomfort is musculoskeletal. He also has a history of Zenker's diverticulum. This was previously evaluated by  Dr. Servando Snare. No further workup was pursued. I have recommended proceeding with chest x-ray today. I have also recommended that he be referred back to GI. He has seen Dr. Ardis Hughs in the past. I have also asked him to use ibuprofen for 3-5 days as well as to use prn heat to his chest.    2. CAD - s/p CABG.  LHC in 12/16 was done for chest pain and abnormal nuclear test and demonstrated 2/3 grafts patent, CTO of RCA and occluded S-RCA.  Distal RCA filled by L-R collaterals and med Rx was recommended.  Nitrates were not added during his admit b/c of labile BPs and severe HA.  As noted, he has had a lot of chest pain.  Some of his symptoms improved with antianginal Rx.  He is currently having MSK chest pain.  Proceed with workup as above.  Continue ASA, statin, beta-blocker, Ranolazine.   3. HTN -  His BP runs low at times and he is symptomatic.  I have asked him to decrease Lisinopril to 5 mg QD and to keep an eye on his BP  4. HL - Continue statin.   5. AAA s/p Endovascular repair - FU with [facility].     6. Fatigue - This is a chronic issue.  Check CBC, BMET, TSH.  7. Palpitations - Probably PVCs.  Check 48 Hr Holter.  8. Zenker's Diverticulum - He has chest pain, dysphagia, odynophagia.  He notes food getting stuck in his throat for hours at a time. I have recommended he see GI.  Refer back to Dr. Ardis Hughs.    Medication Adjustments/Labs and Tests Ordered: Current medicines are reviewed at length with the patient today.  Concerns regarding medicines are outlined above.  Medication changes, Labs and Tests ordered today are outlined in the Patient Instructions noted below. Patient Instructions  Medication Instructions:  Decrease Lisinopril to 5 mg daily; new script sent in  Labwork: BMET, CBC, TSH today  Testing/Procedures: Chest xray to be done today at [place] located at [facility]  48 Holter monitor to be applied at [facility].   Follow-Up: Dr [name] 4 to 6  weeks  Any Other Special Instructions Will Be Listed Below (If Applicable). Ibuprofen 400 mg tid for 3 to 5 days with food then discontinue Apply Heat 1-2 times a day for chest pain  You are being referred to GI Dr. Ardis Hughs.  Dr Eugenia Pancoast office will contact you with the appointment.   If you need a refill on your cardiac medications before your next appointment, please call your pharmacy.  Signed, Richardson Dopp, PA-C  03/19/2016 11:15 AM    [facility] [place], [place],   [address] Phone: [phone]; Fax: [phone]

## 2016-03-19 ENCOUNTER — Encounter: Payer: Self-pay | Admitting: Physician Assistant

## 2016-03-19 ENCOUNTER — Ambulatory Visit
Admission: RE | Admit: 2016-03-19 | Discharge: 2016-03-19 | Disposition: A | Discharge status: Home / Self Care | Payer: BLUE CROSS/BLUE SHIELD | Source: Ambulatory Visit | Attending: Cardiovascular Disease | Admitting: Cardiovascular Disease

## 2016-03-19 ENCOUNTER — Ambulatory Visit (INDEPENDENT_AMBULATORY_CARE_PROVIDER_SITE_OTHER): Payer: BLUE CROSS/BLUE SHIELD | Admitting: Physician Assistant

## 2016-03-19 VITALS — BP 100/64 | HR 51 | Ht 71.0 in | Wt 206.0 lb

## 2016-03-19 DIAGNOSIS — I2510 Atherosclerotic heart disease of native coronary artery without angina pectoris: Secondary | ICD-10-CM

## 2016-03-19 DIAGNOSIS — I10 Essential (primary) hypertension: Secondary | ICD-10-CM

## 2016-03-19 DIAGNOSIS — R5383 Other fatigue: Secondary | ICD-10-CM

## 2016-03-19 DIAGNOSIS — K225 Diverticulum of esophagus, acquired: Secondary | ICD-10-CM

## 2016-03-19 DIAGNOSIS — Z95828 Presence of other vascular implants and grafts: Secondary | ICD-10-CM

## 2016-03-19 DIAGNOSIS — R002 Palpitations: Secondary | ICD-10-CM

## 2016-03-19 DIAGNOSIS — E785 Hyperlipidemia, unspecified: Secondary | ICD-10-CM

## 2016-03-19 DIAGNOSIS — R0789 Other chest pain: Secondary | ICD-10-CM

## 2016-03-19 LAB — BASIC METABOLIC PANEL
BUN: 25 mg/dL (ref 7–25)
CO2: 23 mmol/L (ref 20–31)
Calcium: 9.1 mg/dL (ref 8.6–10.3)
Chloride: 102 mmol/L (ref 98–110)
Creat: 1.16 mg/dL (ref 0.70–1.25)
GLUCOSE: 92 mg/dL (ref 65–99)
Potassium: 4.5 mmol/L (ref 3.5–5.3)
Sodium: 136 mmol/L (ref 135–146)

## 2016-03-19 LAB — CBC WITH DIFFERENTIAL/PLATELET
BASOS ABS: 0 {cells}/uL (ref 0–200)
Basophils Relative: 0 %
Eosinophils Absolute: 244 cells/uL (ref 15–500)
Eosinophils Relative: 2 %
HEMATOCRIT: 45.2 % (ref 38.5–50.0)
HEMOGLOBIN: 15.7 g/dL (ref 13.2–17.1)
LYMPHS ABS: 2074 {cells}/uL (ref 850–3900)
Lymphocytes Relative: 17 %
MCH: 32.8 pg (ref 27.0–33.0)
MCHC: 34.7 g/dL (ref 32.0–36.0)
MCV: 94.4 fL (ref 80.0–100.0)
MONO ABS: 1342 {cells}/uL — AB (ref 200–950)
MPV: 9.6 fL (ref 7.5–12.5)
Monocytes Relative: 11 %
NEUTROS PCT: 70 %
Neutro Abs: 8540 cells/uL — ABNORMAL HIGH (ref 1500–7800)
Platelets: 238 10*3/uL (ref 140–400)
RBC: 4.79 MIL/uL (ref 4.20–5.80)
RDW: 13.1 % (ref 11.0–15.0)
WBC: 12.2 10*3/uL — ABNORMAL HIGH (ref 3.8–10.8)

## 2016-03-19 LAB — TSH: TSH: 1.57 mIU/L (ref 0.40–4.50)

## 2016-03-19 MED ORDER — LISINOPRIL 5 MG PO TABS: 5.0000 mg | ORAL_TABLET | Freq: Every day | ORAL | 3 refills | Status: DC

## 2016-03-19 NOTE — Patient Instructions (Addendum)
Medication Instructions:  Decrease Lisinopril to 5 mg daily; new script sent in  Labwork: BMET, CBC, TSH today  Testing/Procedures: Chest xray to be done today at [place] located at [facility]  48 Holter monitor to be applied at [facility].   Follow-Up: Dr [name] 4 to 6 weeks  Any Other Special Instructions Will Be Listed Below (If Applicable). Ibuprofen 400 mg tid for 3 to 5 days with food then discontinue Apply Heat 1-2 times a day for chest pain  You are being referred to GI Dr. Ardis Hughs.  Dr Eugenia Pancoast office will contact you with the appointment.   If you need a refill on your cardiac medications before your next appointment, please call your pharmacy.

## 2016-03-23 ENCOUNTER — Ambulatory Visit: Payer: BLUE CROSS/BLUE SHIELD | Admitting: Cardiovascular Disease

## 2016-03-23 ENCOUNTER — Telehealth: Payer: Self-pay

## 2016-03-23 NOTE — Telephone Encounter (Signed)
Patient calls to report he was contacted by Cardiology to report that his WBC count was elevated.  He would like to know if he needs to come in and see Dr. Danise Mina for this elevated level as he currently feels fine and does not have any symptoms.    Lab report from Cardiology does instruct patient to follow up with primary for this elevation.  All other labs on 03/19/16 were ok per Dr. [name].  Please advise.

## 2016-03-24 NOTE — Telephone Encounter (Signed)
WBC was only mildly elevated. Previous blood count readings have been ok.  Ensure no fevers, UTI sxs, cough, skin infection.  If feeling well, recommend just remind [place] to recheck at f/u in December.

## 2016-03-24 NOTE — Telephone Encounter (Signed)
Spoke with patient. He said he feels fatigued more so than usual and thinks the thrush is coming back and the nystatin isn't helping after a week of use. He said he hasn't had a fever when he has checked, but has just generally felt like he has. Appt scheduled for tomorrow.

## 2016-03-25 ENCOUNTER — Ambulatory Visit (INDEPENDENT_AMBULATORY_CARE_PROVIDER_SITE_OTHER): Payer: BLUE CROSS/BLUE SHIELD | Admitting: Family Medicine

## 2016-03-25 ENCOUNTER — Encounter: Payer: Self-pay | Admitting: Family Medicine

## 2016-03-25 ENCOUNTER — Ambulatory Visit (INDEPENDENT_AMBULATORY_CARE_PROVIDER_SITE_OTHER): Payer: BLUE CROSS/BLUE SHIELD

## 2016-03-25 VITALS — BP 118/76 | HR 68 | Temp 98.1°F | Wt 204.0 lb

## 2016-03-25 DIAGNOSIS — R079 Chest pain, unspecified: Secondary | ICD-10-CM

## 2016-03-25 DIAGNOSIS — Z87891 Personal history of nicotine dependence: Secondary | ICD-10-CM | POA: Diagnosis not present

## 2016-03-25 DIAGNOSIS — R002 Palpitations: Secondary | ICD-10-CM | POA: Diagnosis not present

## 2016-03-25 DIAGNOSIS — Z23 Encounter for immunization: Secondary | ICD-10-CM

## 2016-03-25 DIAGNOSIS — R1310 Dysphagia, unspecified: Secondary | ICD-10-CM | POA: Diagnosis not present

## 2016-03-25 DIAGNOSIS — C679 Malignant neoplasm of bladder, unspecified: Secondary | ICD-10-CM

## 2016-03-25 DIAGNOSIS — K225 Diverticulum of esophagus, acquired: Secondary | ICD-10-CM

## 2016-03-25 NOTE — Patient Instructions (Addendum)
Flu shot today Stop nystatin. We will refer you to ENT for further eval of sore throat.  Good to see you today, call us with questions.

## 2016-03-25 NOTE — Assessment & Plan Note (Signed)
Worsening - along with ST and sore mouth.  Encouraged f/u with dentist. Will refer to ENT for further evaluation of symptoms as well. Pt agrees with plan.

## 2016-03-25 NOTE — Progress Notes (Signed)
BP 118/76   Pulse 68   Temp 98.1 F (36.7 C) (Oral)   Wt 204 lb (92.5 kg)   BMI 28.45 kg/m    CC: fatigue Subjective:    Patient ID: Carl Lee, male    DOB: 1956-03-29, 60 y.o.   MRN: [id number]  HPI: Carl Lee is a 60 y.o. male presenting on 03/25/2016 for Fatigue (wants to discuss labs from cards) and Thrush   Recent mild leukocytosis to 12.2 on labwork at cardiology office. Denies fevers/chills, cough, skin infection, or UTI sxs.   Ongoing fatigue with chest pain since CABG. Has been referred back to Dr Ardis Hughs for further evaluation of Zenker's diverticulum (dx by CVTS Dr Servando Snare) and ?relation to chronic chest pain. Worsening dysphagia. appt 05/2016.  Ongoing thrush symptoms despite nystatin swish/swallow x 1 wk. Sides of mouth feel red and inflamed. Ongoing sore throat for months. Possible swollen glands. Denies congestion. No unexpected weight loss. Has not recently seen dentist.   12 wks ago had PRP for back and elbows - significantly improved back pain.   Ex smoker - 30+ PY hx. Pt endorses he quit smoking 2015, but still occasionally smokes 1 cig per month when with friends.   Lab Results  Component Value Date   HGBA1C 6.1 06/20/2015   Lab Results  Component Value Date   [id number] 510 09/23/2014     Relevant past medical, surgical, family and social history reviewed and updated as indicated. Interim medical history since our last visit reviewed. Allergies and medications reviewed and updated. Current Outpatient Prescriptions on File Prior to Visit  Medication Sig  . albuterol (PROAIR HFA) 108 (90 BASE) MCG/ACT inhaler Inhale 2 puffs into the lungs every 6 (six) hours as needed for wheezing or shortness of breath.  [name] aspirin EC 325 MG EC tablet Take 1 tablet (325 mg total) by mouth daily.  [name] atorvastatin (LIPITOR) 20 MG tablet TAKE 1 TABLET (20 MG TOTAL) BY MOUTH DAILY AT 6 PM.  . diclofenac sodium (VOLTAREN) 1 % GEL Apply topically 4 (four) times daily as  needed. Use as directed  . famotidine (PEPCID) 20 MG tablet Take 20 mg by mouth 2 (two) times daily.  . fluticasone-salmeterol (ADVAIR HFA) 230-21 MCG/ACT inhaler Inhale 2 puffs into the lungs 2 (two) times daily.  [name] HYDROcodone-acetaminophen (NORCO/VICODIN) 5-325 MG tablet Take 1 tablet by mouth every 8 (eight) hours as needed for severe pain.  [name] lisinopril (PRINIVIL,ZESTRIL) 5 MG tablet Take 1 tablet (5 mg total) by mouth daily with breakfast.  . nebivolol (BYSTOLIC) 2.5 MG tablet Take 1 tablet (2.5 mg total) by mouth daily.  [name] nystatin (MYCOSTATIN) 100000 UNIT/ML suspension Use 5 mL as directed four times a day  . ranolazine (RANEXA) 1000 MG SR tablet Take 1 tablet (1,000 mg total) by mouth 2 (two) times daily.  [name] umeclidinium bromide (INCRUSE ELLIPTA) 62.5 MCG/INH AEPB Inhale 1 puff into the lungs daily.  . vitamin B-12 (CYANOCOBALAMIN) 500 MCG tablet Take 1 tablet (500 mcg total) by mouth daily.   No current facility-administered medications on file prior to visit.    Past Medical History:  Diagnosis Date  . AAA (abdominal aortic aneurysm) ([place]) 03/25/12   s/p endovascular repair  . Adenomatous polyp 06/2009  . Bladder cancer ([place]) 08/16/2008  . Childhood asthma   . Contact dermatitis    atypical [name])  . COPD (chronic obstructive pulmonary disease) ([place])   . Coronary artery disease    a.2015: CABG w/ LIMA-LAD, RIMA-OM,  SVG-RCA b. Cath on 07/10/15 w/ patent LIMA-LAD and RIMA-OM. SVG-RCA occluded but collaterals present  . Diverticulosis 2013   severe by CT and colonoscopy  . Environmental allergies    improved as ages  . Fracture of lateral malleolus of left ankle 08/29/2013  . Hepatic steatosis 06/2015   by [place]  . Hx of migraines   . Hypertension   . Lumbosacral radiculopathy at S1 03/2012   left, with spinal stenosis (MRI 04/2013) improved with TF ESI L5/S1 and S1/2 ([facility] [place])  . OSA (obstructive sleep apnea) 09/04/2014   Severe with AHI 35/hr  . Prediabetes  06/08/2014   CAD   . Resistance to clopidogrel 2015   drug metabolism panel run - asked to scan  . Spinal stenosis    LS1 nerve root impingement from bulging disc  . Vitamin B12 deficiency   . Zenker diverticulum     Past Surgical History:  Procedure Laterality Date  . ABDOMINAL AORTIC ANEURYSM REPAIR  03/25/12   endovascular  . ABI  05/2013   WNL, L TBI low at 0.66  . Bladder cancer  March 2010 and Oct. 2015   [facility]) x 2  . CARDIAC CATHETERIZATION N/A 07/10/2015   Procedure: Left Heart Cath and Cors/Grafts Angiography;  Surgeon: [name], MD;  Location: [facility];  Service: Cardiovascular;  Laterality: N/A;  . COLONOSCOPY  06/2012   hyperplastic polyp, diverticulosis ([place]) rec rpt 5 yrs  . CORONARY ARTERY BYPASS GRAFT N/A 02/07/2014   Procedure: CORONARY ARTERY BYPASS GRAFTING (CABG);  Surgeon: [name], MD;  Location: [facility];  Service: Open Heart Surgery;  Laterality: N/A;  CABG X 3, BILATERAL LIMA, EVH  . CYSTOSCOPY  08/16/08   Bladder Cancer  . EPIDURAL BLOCK INJECTION Left 09/2014, 10/2014, 12/2014   medial L2,3,4, dorsal L5 ramus blocks x2, L L5/S1 and S1/2 transforaminal ESI ([facility] [place])  . ESI  04/2013, 06/2013   L L5S1, S12 transforaminal ESI (Dr.  [name])  . ESI Left 04/2014, 05/2014, 06/2014   L5/S1, S1/2; rpt; L4/5  . INTRAOPERATIVE TRANSESOPHAGEAL ECHOCARDIOGRAM N/A 02/07/2014   Procedure: INTRAOPERATIVE TRANSESOPHAGEAL ECHOCARDIOGRAM;  Surgeon: [name], MD;  Location: [facility];  Service: Open Heart Surgery;  Laterality: N/A;  . LEFT HEART CATHETERIZATION WITH CORONARY ANGIOGRAM N/A 02/01/2014   Procedure: LEFT HEART CATHETERIZATION WITH CORONARY ANGIOGRAM;  Surgeon: [name], MD;  Location: [facility];  Service: Cardiovascular;  Laterality: N/A;  . PRP epidural injection Left 11/2015   L5/S1, S1/2 transforaminal epidural PRP injections under fluoroscopy ([place])  . TONSILLECTOMY AND  ADENOIDECTOMY  1973  . VASECTOMY      Family History  Problem Relation Age of Onset  . Diabetes Mother   . Arrhythmia Mother     pacemaker  . Cancer Mother     Bladder  . COPD Mother   . Thyroid disease Mother   . Hyperlipidemia Mother   . Hypertension Mother   . Diabetes Father   . CAD Father 67    CHF, MI  . Dementia Father   . Heart attack Father   . Diabetes Sister   . Diabetes Brother   . Hypertension Brother   . Heart attack Maternal Grandmother   . Colon cancer Neg Hx     Social History  Substance Use Topics  . Smoking status: Former Smoker    Packs/day: 1.00    Years: 40.00    Types: Cigarettes    Quit date: 02/05/2014  .  Smokeless tobacco: Never Used  . Alcohol use 0.0 oz/week     Comment: Rare- states 1 oz. per week    Review of Systems Per HPI unless specifically indicated in ROS section     Objective:    BP 118/76   Pulse 68   Temp 98.1 F (36.7 C) (Oral)   Wt 204 lb (92.5 kg)   BMI 28.45 kg/m   Wt Readings from Last 3 Encounters:  03/25/16 204 lb (92.5 kg)  03/19/16 206 lb (93.4 kg)  02/24/16 211 lb (95.7 kg)    Physical Exam  Constitutional: He appears well-developed and well-nourished. No distress.  HENT:  Right Ear: Hearing, tympanic membrane, external ear and ear canal normal.  Left Ear: Hearing, tympanic membrane, external ear and ear canal normal.  Nose: No mucosal edema or rhinorrhea.  Mouth/Throat: Uvula is midline. Mucous membranes are pale. Posterior oropharyngeal erythema present. No oropharyngeal exudate.  Erythema and mild pallor of oral mucosa but no frank lesions noted. No exudates or thrush appreciated either. Dry mucous membranes.   Eyes: Conjunctivae and EOM are normal. Pupils are equal, round, and reactive to light. No scleral icterus.  Neck: Normal range of motion. Neck supple. No thyromegaly present.  Cardiovascular: Normal rate, regular rhythm, normal heart sounds and intact distal pulses.   No murmur  heard. Pulmonary/Chest: Effort normal and breath sounds normal. No respiratory distress. He has no wheezes.  Musculoskeletal: He exhibits no edema.  Lymphadenopathy:    He has no cervical adenopathy.  Skin: Skin is warm and dry. No rash noted.  Psychiatric: He has a normal mood and affect.  Nursing note and vitals reviewed.  Results for orders placed or performed in visit on 96/29/52  Basic Metabolic Panel (BMET)  Result Value Ref Range   Sodium 136 135 - 146 mmol/L   Potassium 4.5 3.5 - 5.3 mmol/L   Chloride 102 98 - 110 mmol/L   CO2 23 20 - 31 mmol/L   Glucose, Bld 92 65 - 99 mg/dL   BUN 25 7 - 25 mg/dL   Creat 1.16 0.70 - 1.25 mg/dL   Calcium 9.1 8.6 - 10.3 mg/dL  CBC w/Diff  Result Value Ref Range   WBC 12.2 (H) 3.8 - 10.8 K/uL   RBC 4.79 4.20 - 5.80 MIL/uL   Hemoglobin 15.7 13.2 - 17.1 g/dL   HCT 45.2 38.5 - 50.0 %   MCV 94.4 80.0 - 100.0 fL   MCH 32.8 27.0 - 33.0 pg   MCHC 34.7 32.0 - 36.0 g/dL   RDW 13.1 11.0 - 15.0 %   Platelets 238 140 - 400 K/uL   MPV 9.6 7.5 - 12.5 fL   Neutro Abs 8,540 (H) 1,500 - 7,800 cells/uL   Lymphs Abs 2,074 850 - 3,900 cells/uL   Monocytes Absolute 1,342 (H) 200 - 950 cells/uL   Eosinophils Absolute 244 15 - 500 cells/uL   Basophils Absolute 0 0 - 200 cells/uL   Neutrophils Relative % 70 %   Lymphocytes Relative 17 %   Monocytes Relative 11 %   Eosinophils Relative 2 %   Basophils Relative 0 %   Smear Review Criteria for review not met   TSH  Result Value Ref Range   TSH 1.57 0.40 - 4.50 mIU/L      Assessment & Plan:   Problem List Items Addressed This Visit    Bladder cancer ([place])    Appreciate uro care. Rpt ablation 2015. Sees Dr Risa Grill  Chest pain    Chronic since CABG s/p thorough card eval. Thought MSK vs GI related.       Dysphagia - Primary    Worsening - along with ST and sore mouth.  Encouraged f/u with dentist. Will refer to ENT for further evaluation of symptoms as well. Pt agrees with plan.       Ex-smoker    Encouraged full cessation.      Zenker's diverticulum    Known. Anticipate contributing to sxs.        Other Visit Diagnoses    Need for influenza vaccination       Relevant Orders   Flu Vaccine QUAD 36+ mos PF IM (Fluarix & Fluzone Quad PF)       Follow up plan: No Follow-up on file.  [name], MD

## 2016-03-25 NOTE — Assessment & Plan Note (Signed)
Encouraged full cessation.  ?

## 2016-03-25 NOTE — Assessment & Plan Note (Signed)
Appreciate uro care. Rpt ablation 2015. Sees Dr Risa Grill

## 2016-03-25 NOTE — Assessment & Plan Note (Signed)
Known. Anticipate contributing to sxs.

## 2016-03-25 NOTE — Assessment & Plan Note (Signed)
Chronic since CABG s/p thorough card eval. Thought MSK vs GI related.

## 2016-04-13 ENCOUNTER — Encounter: Payer: Self-pay | Admitting: Family Medicine

## 2016-04-14 ENCOUNTER — Other Ambulatory Visit: Payer: Self-pay | Admitting: Unknown Physician Specialty

## 2016-04-14 DIAGNOSIS — R1310 Dysphagia, unspecified: Secondary | ICD-10-CM

## 2016-04-15 ENCOUNTER — Telehealth: Payer: Self-pay | Admitting: Pulmonary Disease

## 2016-04-15 ENCOUNTER — Telehealth: Payer: Self-pay

## 2016-04-15 ENCOUNTER — Telehealth: Payer: Self-pay | Admitting: Cardiovascular Disease

## 2016-04-15 MED ORDER — AMLODIPINE BESYLATE 5 MG PO TABS
5.0000 mg | ORAL_TABLET | Freq: Every day | ORAL | 11 refills | Status: DC
Start: 2016-04-15 — End: 2016-11-12

## 2016-04-15 NOTE — Telephone Encounter (Signed)
Pt left v/m; pt saw ENT about his throat and ENT, Dr Tami Ribas wants pt to stop ace inhibitor and stop using nasal sprays; also wants pt to stop taking Ranexa due to pts throat; pt wants Dr Danise Mina input about stopping these meds.

## 2016-04-15 NOTE — Telephone Encounter (Signed)
Called patient with Dr. Kyla Balzarine responds. Patient was agreeable to plan. Sent prescription for Norvasc 5 mg by mouth daily to patient's pharmacy of choice.

## 2016-04-15 NOTE — Telephone Encounter (Signed)
Patient calling to inform Carl Lee that his ENT doctor, Carl Lee has told him to stop his Ranexa and his Lisinopril due to having thrush in his mouth and throat. Ranexa was stopped because patient keeps choking on the pill and has a hard time swallowing. Carl Lee wants patient to stop his Ace inhibitors (lisinopril) to prevent his tongue from swelling more. Patient does not want to stop his Lisinopril because of his elevated BP. Patient stated his BP is running high in the mornings and low in the evenings. High is 158/102 and Low is 90/59. Patient stated he was taking the Ranexa for his chest pain, but it is not helping. Ranexa is extended release, so crushing the pill, I think, would not be optional.  Will forward to Carl Lee for advisement.

## 2016-04-15 NOTE — Telephone Encounter (Signed)
Patient notified. He said his BP is always >140/90. He said he will just wait to see what the cardiologist says and maybe he will give him another BP med.

## 2016-04-15 NOTE — Telephone Encounter (Signed)
Spoke with pt. States that he went to see ENT for thrush that he has had for over 1 year. They want him to stop taking Advair and Incruse. Pt has stopped these medications will not be not be starting them again.  FYI to BQ.

## 2016-04-15 NOTE — Telephone Encounter (Signed)
Ok to stop both  If BP goes up can try norvasc 5 mg Not clear to me that either are related to thrush

## 2016-04-15 NOTE — Telephone Encounter (Signed)
New Message:     Pt saw his his ENT doctor yesterday. He wants him to stop taking some of his medicine,please call,he needs to discuss this with you to see what Dr [name] thinks.

## 2016-04-15 NOTE — Telephone Encounter (Signed)
He is on a low dose lisinopril. Should be ok to stop for now to see if any improvement. Will need to monitor BP off med to see if we need to start separate BP med - let us know if bp consistently >140/90.  Would touch base with cards re ranexa. Ok to stop for now if ENT recommends.

## 2016-04-19 ENCOUNTER — Telehealth: Payer: Self-pay | Admitting: Pulmonary Disease

## 2016-04-19 NOTE — Telephone Encounter (Signed)
This is a bad idea in terms of his COPD.  He needs to see [place] in clinic to discuss this.  Next available.

## 2016-04-19 NOTE — Telephone Encounter (Signed)
LMTCB

## 2016-04-19 NOTE — Telephone Encounter (Signed)
OK call sooner if respiratory symptoms (cough, dyspnea)

## 2016-04-19 NOTE — Telephone Encounter (Signed)
This message is regarding the 10.5.17 phone note:  Carl Doom, MD  to [facility] Triage Pool   9:08 AM    This is a bad idea in terms of his COPD.  He needs to see [place] in clinic to discuss this.  Next available.     April 15, 2016    Carl Lee, CMA 10:24 AM    Spoke with pt. States that he went to see ENT for thrush that he has had for over 1 year. They want him to stop taking Advair and Incruse. Pt has stopped these medications will not be not be starting them again.   FYI to BQ.     Called spoke with patient and discussed BQ's recommendations.  Pt stated that he has been off of his Advair and Incruse since 10/3 and does not plan to start these back, though he reported he did rinse & brush his teeth and tongue after use.  He is scheduled to see BQ on 11.27.17 and is okay with coming in for a visit, but the first available is not until mid-November.  BQ is this okay?  Pt does not want to see an NP.  Please advise, thank you.

## 2016-04-20 ENCOUNTER — Ambulatory Visit
Admission: RE | Admit: 2016-04-20 | Discharge: 2016-04-20 | Disposition: A | Discharge status: Home / Self Care | Payer: BLUE CROSS/BLUE SHIELD | Source: Ambulatory Visit | Attending: Unknown Physician Specialty | Admitting: Unknown Physician Specialty

## 2016-04-20 DIAGNOSIS — K225 Diverticulum of esophagus, acquired: Secondary | ICD-10-CM | POA: Diagnosis not present

## 2016-04-20 DIAGNOSIS — R1310 Dysphagia, unspecified: Secondary | ICD-10-CM | POA: Diagnosis present

## 2016-04-20 NOTE — Telephone Encounter (Signed)
Called and made the pt aware of BQ recs. Nothing further is needed.

## 2016-04-27 ENCOUNTER — Encounter: Payer: Self-pay | Admitting: Family Medicine

## 2016-04-27 DIAGNOSIS — R5382 Chronic fatigue, unspecified: Secondary | ICD-10-CM | POA: Insufficient documentation

## 2016-05-20 NOTE — Progress Notes (Signed)
Cardiology Office Note:    Date:  05/24/2016   ID:  Carl Lee, DOB 1956-03-05, MRN [id number]  PCP:  [name], MD  Cardiologist:  Dr. Jenkins Lee   Electrophysiologist:  N/a Pulmonology: Dr. [name] GI: Dr. Ardis Hughs  Referring MD: [name], MD   Chief Complaint  Patient presents with  . Coronary Artery Disease    History of Present Illness:    Carl Lee is a 60 y.o. male with a hx of CAD s/p CABG in 2015 with Dr. [name] (L-LAD, RIMA-OM, S-PDA), recurrent bladder CA, carotid artery disease, AAA s/p endovascular repair in 9/13 by Dr. [name], HTN, HL, DM2, OSA, tobacco abuse.  He  Myoview in 12/16 was intermediate risk with anterior, anteroseptal and apical scar with peri-infarct ischemia.  LHC demonstrated severe native 3 vessel CAD with patent LIMA-LAD and patent free RIMA-OM3. Native RCA was occluded and filled distally from left to right collaterals. SVG-RCA was occluded. Medical therapy was recommended.    Since his CABG, he has continued to complain of fatigue and chronic chest pain.  I put him on Ranolazine in 1/17 as some of his symptoms may have been related to his occluded RCA (S-RCA occluded).  Last seen by me  in 3/17. 24 hour ambulatory blood pressure monitor in 4/17 demonstrated normal average blood pressure.  He is also followed by pulmonology for moderate COPD. Last seen in 7/17.  Seen by PA with muscular pain in September   Monitor 03/25/16 short bursts atrial tachycardia Talked with him about Changing bystolic to bid lopressor    Prior CV studies that were reviewed today include:    Echo 11/11/15 Vigorous LVF, EF 65-70%, no RWMA, Gr 1 DD, reduced RVSF, PASP 24 mmHg  24 Hr BP Monitor 4/17 Avg BP in normal range  LHC 07/10/15 LAD 100% LCx ostial 60%, OM1 40%, OM3 40% RCA 100% - dist RCA fills form L-R collats S-RCA 100% L-LAD ok Free RIMA-OM3 ok EF normal Med Rx  Myoview 06/25/15 Anterior, anteroseptal, apical scar with  peri-infarct ischemia, EF 60%, intermediate risk  Abd/Pelvic CTA 11/15 Patent bilateral renal arteries.  LHC (7/15):  pLAD 30 then 100 (L-L collats), pCFX 80, mCFX 50, pRCA 100 (R-R and L-R collats), EF 50%, ant HK and poss LV apical aneurysm  Nuclear (7/15): Inferior and anteroseptal, mid and dist anterior and dist inferior and apical fixed defect, no ischemia, EF 49%; High Risk  Carotid US (7/15):  Bilateral ICA 1-39%  Past Medical History:  Diagnosis Date  . AAA (abdominal aortic aneurysm) ([place]) 03/25/12   s/p endovascular repair  . Adenomatous polyp 06/2009  . Bladder cancer ([place]) 08/16/2008  . Childhood asthma   . Contact dermatitis    atypical [name])  . COPD (chronic obstructive pulmonary disease) ([place])   . Coronary artery disease    a.2015: CABG w/ LIMA-LAD, RIMA-OM, SVG-RCA b. Cath on 07/10/15 w/ patent LIMA-LAD and RIMA-OM. SVG-RCA occluded but collaterals present  . Diverticulosis 2013   severe by CT and colonoscopy  . Environmental allergies    improved as ages  . Fracture of lateral malleolus of left ankle 08/29/2013  . Hepatic steatosis 06/2015   by [place]  . Hx of migraines   . Hypertension   . Lumbosacral radiculopathy at S1 03/2012   left, with spinal stenosis (MRI 04/2013) improved with TF ESI L5/S1 and S1/2 ([place] [place])  . OSA (obstructive sleep apnea) 09/04/2014   Severe with AHI 35/hr  . Prediabetes 06/08/2014  CAD   . Resistance to clopidogrel 2015   drug metabolism panel run - asked to scan  . Spinal stenosis    LS1 nerve root impingement from bulging disc  . Vitamin B12 deficiency   . Zenker diverticulum     Past Surgical History:  Procedure Laterality Date  . ABDOMINAL AORTIC ANEURYSM REPAIR  03/25/12   endovascular  . ABI  05/2013   WNL, L TBI low at 0.66  . Bladder cancer  March 2010 and Oct. 2015   [facility] Spell) x 2  . CARDIAC CATHETERIZATION N/A 07/10/2015   Procedure: Left Heart Cath and Cors/Grafts Angiography;  Surgeon:  [name], MD;  Location: [facility];  Service: Cardiovascular;  Laterality: N/A;  . COLONOSCOPY  06/2012   hyperplastic polyp, diverticulosis ([place]) rec rpt 5 yrs  . CORONARY ARTERY BYPASS GRAFT N/A 02/07/2014   Procedure: CORONARY ARTERY BYPASS GRAFTING (CABG);  Surgeon: [name], MD;  Location: [facility];  Service: Open Heart Surgery;  Laterality: N/A;  CABG X 3, BILATERAL LIMA, EVH  . CYSTOSCOPY  08/16/08   Bladder Cancer  . EPIDURAL BLOCK INJECTION Left 09/2014, 10/2014, 12/2014   medial L2,3,4, dorsal L5 ramus blocks x2, L L5/S1 and S1/2 transforaminal ESI ([facility] [place])  . ESI  04/2013, 06/2013   L L5S1, S12 transforaminal ESI (Dr.  [name])  . ESI Left 04/2014, 05/2014, 06/2014   L5/S1, S1/2; rpt; L4/5  . ESI  03/2016   R L5/S1 interlaminar ESI  . INTRAOPERATIVE TRANSESOPHAGEAL ECHOCARDIOGRAM N/A 02/07/2014   Procedure: INTRAOPERATIVE TRANSESOPHAGEAL ECHOCARDIOGRAM;  Surgeon: [name], MD;  Location: [facility];  Service: Open Heart Surgery;  Laterality: N/A;  . LEFT HEART CATHETERIZATION WITH CORONARY ANGIOGRAM N/A 02/01/2014   Procedure: LEFT HEART CATHETERIZATION WITH CORONARY ANGIOGRAM;  Surgeon: [name], MD;  Location: [facility];  Service: Cardiovascular;  Laterality: N/A;  . PRP epidural injection Left 11/2015   L5/S1, S1/2 transforaminal epidural PRP injections under fluoroscopy ([place])  . TONSILLECTOMY AND ADENOIDECTOMY  1973  . VASECTOMY      Current Medications: Current Meds  Medication Sig  . albuterol (PROAIR HFA) 108 (90 BASE) MCG/ACT inhaler Inhale 2 puffs into the lungs every 6 (six) hours as needed for wheezing or shortness of breath.  [name] amLODipine (NORVASC) 5 MG tablet Take 1 tablet (5 mg total) by mouth daily.  [name] aspirin EC 325 MG EC tablet Take 1 tablet (325 mg total) by mouth daily.  [name] atorvastatin (LIPITOR) 20 MG tablet TAKE 1 TABLET (20 MG TOTAL) BY MOUTH DAILY AT 6 PM.  . diclofenac sodium  (VOLTAREN) 1 % GEL Apply topically 4 (four) times daily as needed. Use as directed  . famotidine (PEPCID) 20 MG tablet Take 20 mg by mouth 2 (two) times daily.  . fluticasone-salmeterol (ADVAIR HFA) 230-21 MCG/ACT inhaler Inhale 2 puffs into the lungs 2 (two) times daily.  [name] HYDROcodone-acetaminophen (NORCO/VICODIN) 5-325 MG tablet Take 1 tablet by mouth every 8 (eight) hours as needed for severe pain.  [name] levothyroxine (SYNTHROID, LEVOTHROID) 25 MCG tablet Take 25 mcg by mouth daily before breakfast.  . liothyronine (CYTOMEL) 5 MCG tablet Take 25 mcg by mouth daily.  . vitamin B-12 (CYANOCOBALAMIN) 500 MCG tablet Take 1 tablet (500 mcg total) by mouth daily.  . [DISCONTINUED] nebivolol (BYSTOLIC) 2.5 MG tablet Take 1 tablet (2.5 mg total) by mouth daily.     Allergies:   Codeine and Doxycycline   Social History   Social  History  . Marital status: Married    Spouse name: N/A  . Number of children: 2  . Years of education: N/A   Occupational History  . Self Employed    Social History Main Topics  . Smoking status: Former Smoker    Packs/day: 1.00    Years: 40.00    Types: Cigarettes    Quit date: 02/05/2014  . Smokeless tobacco: Never Used  . Alcohol use 0.0 oz/week     Comment: Rare- states 1 oz. per week  . Drug use: No  . Sexual activity: Not Asked   Other Topics Concern  . None   Social History Narrative   Caffeine: 1 cup coffee/day   Lives with wife, 1 dog   grown children   Occupation: Therapist, sports - self employed.  Disability after CABG   Edu: 2 yrs college   Activity: fishing, walking occasionally   Diet: good water, fruits/vegetables daily     Family History:  The patient's family history includes Arrhythmia in his mother; CAD (age of onset: 33) in his father; COPD in his mother; Cancer in his mother; Dementia in his father; Diabetes in his brother, father, mother, and sister; Heart attack in his father and maternal grandmother; Hyperlipidemia in his  mother; Hypertension in his brother and mother; Thyroid disease in his mother.   ROS:   Please see the history of present illness.    Review of Systems  Constitution: Positive for malaise/fatigue.  Eyes: Positive for visual disturbance.  Cardiovascular: Positive for chest pain, dyspnea on exertion and irregular heartbeat.  Respiratory: Positive for wheezing.   Hematologic/Lymphatic: Bruises/bleeds easily.  Skin: Positive for rash.  Musculoskeletal: Positive for back pain.   All other systems reviewed and are negative.   EKGs/Labs/Other Test Reviewed:    EKG:  EKG is  ordered today.  The ekg ordered today demonstrates sinus brady, HR 51, normal axis, septal Q waves, QTc 383 ms, no changes.   Recent Labs: 03/19/2016: BUN 25; Creat 1.16; Hemoglobin 15.7; Platelets 238; Potassium 4.5; Sodium 136; TSH 1.57   Recent Lipid Panel    Component Value Date/Time   CHOL 97 06/20/2015 0910   CHOL 170 01/05/2012   TRIG 83.0 06/20/2015 0910   TRIG 141 01/05/2012   HDL 34.20 (L) 06/20/2015 0910   CHOLHDL 3 06/20/2015 0910   VLDL 16.6 06/20/2015 0910   LDLCALC 46 06/20/2015 0910   LDLDIRECT 105 01/05/2012     Physical Exam:    VS:  BP 100/60   Pulse (!) 56   Ht 5\' 11"  (1.803 m)   Wt 93.6 kg (206 lb 6.4 oz)   SpO2 93%   BMI 28.79 kg/m     Wt Readings from Last 3 Encounters:  05/24/16 93.6 kg (206 lb 6.4 oz)  03/25/16 92.5 kg (204 lb)  03/19/16 93.4 kg (206 lb)     Physical Exam  Constitutional: He is oriented to person, place, and time. He appears well-developed and well-nourished. No distress.  HENT:  Head: Normocephalic and atraumatic.  Eyes: No scleral icterus.  Neck: Normal range of motion. No JVD present.  Cardiovascular: Normal rate, regular rhythm, S1 normal and S2 normal.  Exam reveals no gallop and no friction rub.   No murmur heard. Pulmonary/Chest: Effort normal and breath sounds normal. He has no decreased breath sounds. He has no wheezes. He has no rhonchi. He has  no rales. He exhibits tenderness.  Abdominal: Soft. There is no tenderness.  Musculoskeletal: He exhibits no  edema.  Neurological: He is alert and oriented to person, place, and time.  Skin: Skin is warm and dry.  Psychiatric: He has a normal mood and affect.    ASSESSMENT:    1. Coronary artery disease    PLAN:    In order of problems listed above:  1. Chest pain -Seen by PA 03/19/16 muscular pain Rx NSAI's and referred back to GI    2. CAD - s/p CABG.  LHC in 12/16 was done for chest pain and abnormal nuclear test and demonstrated 2/3 grafts patent, CTO of RCA and occluded S-RCA.  Distal RCA filled by L-R collaterals and med Rx was recommended.  Nitrates were not added during his admit b/c of labile BPs and severe HA.  As noted, he has had a lot of chest pain.  Some of his symptoms improved with antianginal Rx.  He is currently having MSK chest pain.  Proceed with workup as above.  Continue ASA, statin, beta-blocker. Ranolazine stopped by ENT concern for thrush   3. HTN -  ACE stopped ? ENT concerned about thrush started on norvasc October   4. HL - Continue statin.   5. AAA s/p Endovascular repair - FU with [facility].     6. Fatigue - This is a chronic issue.  Check CBC, BMET, TSH.  7. Palpitations - Atrial tach on monitor change bystolic to lopressor [id number] bid   8. Zenker's Diverticulum - He has chest pain, dysphagia, odynophagia.  He notes food getting stuck in his throat for hours at a time. Cannot take ranexa because of this F/U GI   Carl Lee

## 2016-05-23 ENCOUNTER — Other Ambulatory Visit: Payer: Self-pay | Admitting: Physician Assistant

## 2016-05-23 DIAGNOSIS — I10 Essential (primary) hypertension: Secondary | ICD-10-CM

## 2016-05-24 ENCOUNTER — Encounter: Payer: Self-pay | Admitting: Cardiovascular Disease

## 2016-05-24 ENCOUNTER — Ambulatory Visit (INDEPENDENT_AMBULATORY_CARE_PROVIDER_SITE_OTHER): Payer: BLUE CROSS/BLUE SHIELD | Admitting: Cardiovascular Disease

## 2016-05-24 VITALS — BP 100/60 | HR 56 | Ht 71.0 in | Wt 206.4 lb

## 2016-05-24 DIAGNOSIS — I2510 Atherosclerotic heart disease of native coronary artery without angina pectoris: Secondary | ICD-10-CM

## 2016-05-24 MED ORDER — METOPROLOL TARTRATE 25 MG PO TABS: 12.5000 mg | ORAL_TABLET | Freq: Two times a day (BID) | ORAL | 3 refills | Status: DC

## 2016-05-24 NOTE — Patient Instructions (Addendum)
Medication Instructions:  Your physician has recommended you make the following change in your medication:  1-STOP Bystolic 2-START Metoprolol 12.5 mg by mouth twice daily  Labwork: NONE  Testing/Procedures: NONE  Follow-Up: Your physician wants you to follow-up in: 6 months with Dr. [name]. You will receive a reminder letter in the mail two months in advance. If you don't receive a letter, please call our office to schedule the follow-up appointment.   If you need a refill on your cardiac medications before your next appointment, please call your pharmacy.

## 2016-05-31 ENCOUNTER — Other Ambulatory Visit: Payer: Self-pay | Admitting: Family Medicine

## 2016-06-07 ENCOUNTER — Ambulatory Visit (INDEPENDENT_AMBULATORY_CARE_PROVIDER_SITE_OTHER): Payer: BLUE CROSS/BLUE SHIELD | Admitting: Pulmonary Disease

## 2016-06-07 ENCOUNTER — Encounter: Payer: Self-pay | Admitting: Pulmonary Disease

## 2016-06-07 VITALS — BP 134/76 | HR 86 | Ht 71.0 in | Wt 207.0 lb

## 2016-06-07 DIAGNOSIS — J432 Centrilobular emphysema: Secondary | ICD-10-CM

## 2016-06-07 DIAGNOSIS — J449 Chronic obstructive pulmonary disease, unspecified: Secondary | ICD-10-CM | POA: Diagnosis not present

## 2016-06-07 MED ORDER — UMECLIDINIUM-VILANTEROL 62.5-25 MCG/INH IN AEPB: 1.0000 | INHALATION_SPRAY | Freq: Every day | RESPIRATORY_TRACT | Status: DC

## 2016-06-07 MED ORDER — UMECLIDINIUM-VILANTEROL 62.5-25 MCG/INH IN AEPB: 1.0000 | INHALATION_SPRAY | Freq: Every day | RESPIRATORY_TRACT | 3 refills | Status: DC

## 2016-06-07 NOTE — Patient Instructions (Signed)
Stop Advair Start Anoro Follow-up with [place] in 6 months or sooner if needed

## 2016-06-07 NOTE — Addendum Note (Signed)
Addended by: Len Blalock on: 06/07/2016 09:28 AM   Modules accepted: Orders

## 2016-06-07 NOTE — Assessment & Plan Note (Signed)
He has been dealing with thrush lately which is most likely due to the inhaled corticosteroid in Advair.  Otherwise this has bene a stable interval for him.  Flu shot is up to date.  Plan: Stop Advair Start Anoro Follow-up 6 months or sooner if needed

## 2016-06-07 NOTE — Progress Notes (Signed)
Subjective:    Patient ID: Carl Lee, male    DOB: 1956/07/06, 60 y.o.   MRN: [id number]  Synopsis: Carl Lee had a CABG in 2015 and pulmonary critical care medicine was consulted for a COPD exacerbation. July 2015 lung function testing showed a ratio 62%, FEV1 of 2.10 L (56% predicted), total lung capacity 6.35 L (80% predicted). He smoked for many years and quit smoking in 2015. April 2017 pulmonary function testing: FEV1 1.8 L, total lung capacity 99% predicted, DLCO 39% predicted 11/12/2015 TTE > normal LVEF, RV mildly dilated and systolic function mildly reduced  HPI  Chief Complaint  Patient presents with  . Follow-up    Pt recently treated for thrush by ENT, wants to discuss d/c'ing Advair.    Carl Lee has been dealing with a lot of thrush lately.  He saw ENT Tami Ribas and was treated with an antifungal.  He said that it has improved some. He was told by his ENT physician to stop taking his inhalers, this made his dyspnea worse.  He has been using the [place], didn't see a big improvement in dyspnea after adding that to the Advair (prior to the thrush).  In terms of his breathing he has noticed no difficulty with walking 2 miles at a slow pace.  He says that walking any distance with carrying anything over 10-15 pounds will make him dyspneic.  He can climb two flights of stairs without significant dyspnea.  No flares of his COPD since July.    Past Medical History:  Diagnosis Date  . AAA (abdominal aortic aneurysm) ([place]) 03/25/12   s/p endovascular repair  . Adenomatous polyp 06/2009  . Bladder cancer ([place]) 08/16/2008  . Childhood asthma   . Contact dermatitis    atypical [name])  . COPD (chronic obstructive pulmonary disease) ([place])   . Coronary artery disease    a.2015: CABG w/ LIMA-LAD, RIMA-OM, SVG-RCA b. Cath on 07/10/15 w/ patent LIMA-LAD and RIMA-OM. SVG-RCA occluded but collaterals present  . Diverticulosis 2013   severe by CT and colonoscopy  . Environmental allergies     improved as ages  . Fracture of lateral malleolus of left ankle 08/29/2013  . Hepatic steatosis 06/2015   by [place]  . Hx of migraines   . Hypertension   . Lumbosacral radiculopathy at S1 03/2012   left, with spinal stenosis (MRI 04/2013) improved with TF ESI L5/S1 and S1/2 ([facility] [place])  . OSA (obstructive sleep apnea) 09/04/2014   Severe with AHI 35/hr  . Prediabetes 06/08/2014   CAD   . Resistance to clopidogrel 2015   drug metabolism panel run - asked to scan  . Spinal stenosis    LS1 nerve root impingement from bulging disc  . Vitamin B12 deficiency   . Zenker diverticulum      Review of Systems  Constitutional: Positive for fatigue. Negative for chills and fever.  HENT: Negative for postnasal drip, rhinorrhea and sinus pressure.   Respiratory: Positive for cough. Negative for shortness of breath and wheezing.   Cardiovascular: Negative for chest pain, palpitations and leg swelling.       Objective:   Physical Exam Vitals:   06/07/16 0910  BP: 134/76  BP Location: Left Arm  Cuff Size: Normal  Pulse: 86  SpO2: 97%  Weight: 207 lb (93.9 kg)  Height: 5\' 11"  (1.803 m)   RA  Gen: well appearing HENT: OP clear, TM's clear, neck supple PULM: CTA B, normal percussion CV: RRR, no mgr, trace  edema GI: BS+, soft, nontender Derm: no cyanosis or rash Psyche: normal mood and affect   Records from his office visit with cardiology on 05/24/2016 reviewed where his hypertension was managed in addition to ongoing chest pain.    Assessment & Plan:   COPD, moderate ([place]) He has been dealing with thrush lately which is most likely due to the inhaled corticosteroid in Advair.  Otherwise this has bene a stable interval for him.  Flu shot is up to date.  Plan: Stop Advair Start Anoro Follow-up 6 months or sooner if needed   Updated Medication List Outpatient Encounter Prescriptions as of 06/07/2016  Medication Sig  . albuterol (PROAIR HFA) 108 (90 BASE) MCG/ACT inhaler  Inhale 2 puffs into the lungs every 6 (six) hours as needed for wheezing or shortness of breath.  [name] amLODipine (NORVASC) 5 MG tablet Take 1 tablet (5 mg total) by mouth daily.  [name] aspirin EC 325 MG EC tablet Take 1 tablet (325 mg total) by mouth daily.  [name] atorvastatin (LIPITOR) 20 MG tablet TAKE 1 TABLET (20 MG TOTAL) BY MOUTH DAILY AT 6 PM.  . clotrimazole (MYCELEX) 10 MG troche Take 10 mg by mouth 2 (two) times daily.  . diclofenac sodium (VOLTAREN) 1 % GEL Apply topically 4 (four) times daily as needed. Use as directed  . famotidine (PEPCID) 20 MG tablet TAKE 1 TABLET (20 MG TOTAL) BY MOUTH 2 (TWO) TIMES DAILY.  [name] HYDROcodone-acetaminophen (NORCO/VICODIN) 5-325 MG tablet Take 1 tablet by mouth every 8 (eight) hours as needed for severe pain.  [name] levothyroxine (SYNTHROID, LEVOTHROID) 25 MCG tablet Take 25 mcg by mouth daily before breakfast.  . liothyronine (CYTOMEL) 5 MCG tablet Take 25 mcg by mouth daily.  . metoprolol tartrate (LOPRESSOR) 25 MG tablet Take 0.5 tablets (12.5 mg total) by mouth 2 (two) times daily.  . vitamin B-12 (CYANOCOBALAMIN) 500 MCG tablet Take 1 tablet (500 mcg total) by mouth daily.  . [DISCONTINUED] fluticasone-salmeterol (ADVAIR HFA) 230-21 MCG/ACT inhaler Inhale 2 puffs into the lungs 2 (two) times daily.  . [DISCONTINUED] famotidine (PEPCID) 20 MG tablet Take 20 mg by mouth 2 (two) times daily.   Facility-Administered Encounter Medications as of 06/07/2016  Medication  . umeclidinium-vilanterol (ANORO ELLIPTA) 62.5-25 MCG/INH 1 puff

## 2016-06-08 ENCOUNTER — Ambulatory Visit: Payer: BLUE CROSS/BLUE SHIELD | Admitting: Gastroenterology

## 2016-06-11 ENCOUNTER — Encounter: Payer: Self-pay | Admitting: Internal Medicine

## 2016-06-11 ENCOUNTER — Ambulatory Visit (INDEPENDENT_AMBULATORY_CARE_PROVIDER_SITE_OTHER): Payer: BLUE CROSS/BLUE SHIELD | Admitting: Internal Medicine

## 2016-06-11 VITALS — BP 114/76 | HR 77 | Temp 98.6°F | Wt 206.0 lb

## 2016-06-11 DIAGNOSIS — J029 Acute pharyngitis, unspecified: Secondary | ICD-10-CM

## 2016-06-11 DIAGNOSIS — K121 Other forms of stomatitis: Secondary | ICD-10-CM

## 2016-06-11 MED ORDER — MAGIC MOUTHWASH W/LIDOCAINE: 5.0000 mL | Freq: Three times a day (TID) | ORAL | 0 refills | Status: DC | PRN

## 2016-06-11 NOTE — Patient Instructions (Signed)
Oral Ulcers Oral ulcers are sores inside the mouth or near the mouth. They may be called canker sores or cold sores, which are two types of oral ulcers. Many oral ulcers are harmless and go away on their own. In some cases, oral ulcers may require medical care to determine the cause and proper treatment. What are the causes? Common causes of this condition include:  Viral, bacterial, or fungal infection.  Emotional stress.  Foods or chemicals that irritate the mouth.  Injury or physical irritation of the mouth.  Medicines.  Allergies.  Tobacco use. Less common causes include:  Skin disease.  A type of herpes virus infection (herpes simplexor herpes zoster).  Oral cancer. In some cases, the cause of this condition may not be known. What increases the risk? Oral ulcers are more likely to develop in:  People who wear dental braces, dentures, or retainers.  People who do not keep their mouth clean or brush their teeth regularly.  People who have sensitive skin.  People who have conditions that affect the entire body (systemic conditions), such as immune disorders. What are the signs or symptoms? The main symptom of this condition is one or more oval-shaped or round ulcers that have red borders. Details about symptoms may vary depending on the cause.  Location of the ulcers. They may be inside the mouth, on the gums, or on the insides of the lips or cheeks. They may also be on the lips or on skin that is near the mouth, such as the cheeks and chin.  Pain. Ulcers can be painful and uncomfortable, or they can be painless.  Appearance of the ulcers. They may look like red blisters and be filled with fluid, or they may be white or yellow patches.  Frequency of outbreaks. Ulcers may go away permanently after one outbreak, or they may come back (recur) often or rarely. How is this diagnosed? This condition is diagnosed with a physical exam. Your health care provider may ask you  questions about your lifestyle and your medical history. You may have tests, including:  Blood tests.  Removal of a small number of cells from an ulcer to be examined under a microscope (biopsy). How is this treated? This condition is treated by managing any pain and discomfort, and by treating the underlying cause of the ulcers, if necessary. Usually, oral ulcers resolve by themselves in 1-2 weeks. You may be told to keep your mouth clean and avoid things that cause or irritate your ulcers. Your health care provider may prescribe medicines to reduce pain and discomfort or treat the underlying cause, if this applies. Follow these instructions at home: Lifestyle   Follow instructions from your health care provider about eating or drinking restrictions.  Drink enough fluid to keep your urine clear or pale yellow.  Avoid foods and drinks that irritate your ulcers.  Avoid tobacco products, including cigarettes, chewing tobacco, or e-cigarettes. If you need help quitting, ask your health care provider.  Avoid excessive alcohol use. Oral Hygiene   Avoid physical or chemical irritants that may have caused the ulcers or made them worse, such as mouthwashes that contain alcohol (ethanol). If you wear dental braces, dentures, or retainers, work with your health care provider to make sure these devices are fitted correctly.  Brush and floss your teeth at least once every day, and get regular dental cleanings and checkups.  Gargle with a salt-water mixture 3-4 times per day or as told by your health care provider. To   make a salt-water mixture, completely dissolve -1 tsp of salt in 1 cup of warm water. General instructions   Take over-the-counter and prescription medicines only as told by your health care provider.  If you have pain, wrap a cold compress in a towel and gently press it against your face to help reduce pain.  Keep all follow-up visits as told by your health care provider. This is  important. Contact a health care provider if:  You have pain that gets worse or does not get better with medicine.  You have 4 or more ulcers at one time.  You have a fever.  You have new ulcers that look or feel different from other ulcers you have.  You have inflammation in one eye or both eyes.  You have ulcers that do not go away after 10 days.  You develop new symptoms in your mouth, such as:  Bleeding or crusting around your lips or gums.  Tooth pain.  Difficulty swallowing.  You develop symptoms on your skin or genitals, such as:  A rash or blisters.  Burning or itching sensations.  Your ulcers begin or get worse after you start a new medicine. Get help right away if:  You have difficulty breathing.  You have swelling in your face or neck.  You have excessive bleeding from your mouth.  You have severe pain. This information is not intended to replace advice given to you by your health care provider. Make sure you discuss any questions you have with your health care provider. Document Released: 08/05/2004 Document Revised: 12/01/2015 Document Reviewed: 11/13/2014 Elsevier Interactive Patient Education  2017 [employer]  

## 2016-06-11 NOTE — Progress Notes (Signed)
HPI  Pt presents to the clinic today with c/o nasal congestion, sore throat and cough. This started 3 days ago. He is blowing clear mucous out of his nose. He has had difficulty swallowing. The cough is productive of green mucous. He denies fever, chills or body aches but has been fatigue. He has not tried anything OTC. He has not had any sick contacts that he is aware of. He has a history of childhood asthma and COPD.  Review of Systems        Past Medical History:  Diagnosis Date  . AAA (abdominal aortic aneurysm) ([place]) 03/25/12   s/p endovascular repair  . Adenomatous polyp 06/2009  . Bladder cancer ([place]) 08/16/2008  . Childhood asthma   . Contact dermatitis    atypical [name])  . COPD (chronic obstructive pulmonary disease) ([place])   . Coronary artery disease    a.2015: CABG w/ LIMA-LAD, RIMA-OM, SVG-RCA b. Cath on 07/10/15 w/ patent LIMA-LAD and RIMA-OM. SVG-RCA occluded but collaterals present  . Diverticulosis 2013   severe by CT and colonoscopy  . Environmental allergies    improved as ages  . Fracture of lateral malleolus of left ankle 08/29/2013  . Hepatic steatosis 06/2015   by [place]  . Hx of migraines   . Hypertension   . Lumbosacral radiculopathy at S1 03/2012   left, with spinal stenosis (MRI 04/2013) improved with TF ESI L5/S1 and S1/2 ([facility])  . OSA (obstructive sleep apnea) 09/04/2014   Severe with AHI 35/hr  . Prediabetes 06/08/2014   CAD   . Resistance to clopidogrel 2015   drug metabolism panel run - asked to scan  . Spinal stenosis    LS1 nerve root impingement from bulging disc  . Vitamin B12 deficiency   . Zenker diverticulum     Family History  Problem Relation Age of Onset  . Diabetes Mother   . Arrhythmia Mother     pacemaker  . Cancer Mother     Bladder  . COPD Mother   . Thyroid disease Mother   . Hyperlipidemia Mother   . Hypertension Mother   . Diabetes Father   . CAD Father 63    CHF, MI  . Dementia Father   . Heart attack  Father   . Diabetes Sister   . Diabetes Brother   . Hypertension Brother   . Heart attack Maternal Grandmother   . Colon cancer Neg Hx     Social History   Social History  . Marital status: Married    Spouse name: N/A  . Number of children: 2  . Years of education: N/A   Occupational History  . Self Employed    Social History Main Topics  . Smoking status: Former Smoker    Packs/day: 1.00    Years: 40.00    Types: Cigarettes    Quit date: 02/05/2014  . Smokeless tobacco: Never Used  . Alcohol use 0.0 oz/week     Comment: Rare- states 1 oz. per week  . Drug use: No  . Sexual activity: Not on file   Other Topics Concern  . 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  Codeine Other (See Comments)    Nightmares  . Doxycycline Hives    mild      Constitutional: Positive fatigue. Denies headache, fever or abrupt weight changes.  HEENT:  Positive nasal congestion, sore throat. Denies eye redness, eye pain, pressure behind the eyes, facial pain, ear pain, ringing in the ears, wax buildup, runny nose or bloody nose. Respiratory: Positive cough. Denies difficulty breathing or shortness of breath.  Cardiovascular: Denies chest pain, chest tightness, palpitations or swelling in the hands or feet.   No other specific complaints in a complete review of systems (except as listed in HPI above).  Objective:   Pulse 77   Temp 98.6 F (37 C) (Oral)   Wt 206 lb (93.4 kg)   SpO2 95%   BMI 28.73 kg/m  Wt Readings from Last 3 Encounters:  06/11/16 206 lb (93.4 kg)  06/07/16 207 lb (93.9 kg)  05/24/16 206 lb 6.4 oz (93.6 kg)     General: Appears his stated age, in NAD. HEENT: Head:  normal shape and size, no sinus tenderness noted; Eyes: sclera white, no icterus, conjunctiva pink; Ears: Tm's gray and intact, normal light reflex; Throat/Mouth: Teeth present, mucosa pink and moist, no exudate noted, no lesions noted. 3 small oral ulcers noted of right tonsillar area. Neck: No cervical lymphadenopathy.  Pulmonary/Chest: Normal effort and positive vesicular breath sounds. Intermittent bilateral expiratory wheeze noted. No respiratory distress. No rales or ronchi noted.       Assessment & Plan:   Sore throat, secondary to oral ulcer:  RST: negative eRx for Magic Mouthwash with Lidocaine  Salt water gargles/Ibuprofne for sore throat Get some rest and drink plenty of water Start Claritin OTC Delsym as needed for cough  RTC as needed or if symptoms persist.   Webb Silversmith, NP

## 2016-06-17 ENCOUNTER — Telehealth: Payer: Self-pay | Admitting: Cardiology

## 2016-06-17 NOTE — Telephone Encounter (Signed)
Patient states his BP has been elevated since Dr. [name] has made changes.  Currently, his BP is 138/88 and he is asymptomatic. Instructed patient to talk to Dr. Radford Pax about his BP at his appointment tomorrow. He understands to take his medications early enough (around 0630) so it will be working when BP is taken at [place]. He was grateful for call.

## 2016-06-17 NOTE — Telephone Encounter (Signed)
Pt c/o BP issue: STAT if pt c/o blurred vision, one-sided weakness or slurred speech  1. What are your last 5 BP readings?  150/95  133/90   134/86   139-89 2. Are you having any other symptoms (ex. Dizziness, headache, blurred vision, passed out)? headaches  3. What is your BP issue? high

## 2016-06-18 ENCOUNTER — Ambulatory Visit (INDEPENDENT_AMBULATORY_CARE_PROVIDER_SITE_OTHER): Payer: BLUE CROSS/BLUE SHIELD | Admitting: Cardiology

## 2016-06-18 ENCOUNTER — Encounter: Payer: Self-pay | Admitting: Cardiology

## 2016-06-18 VITALS — BP 130/86 | HR 64 | Ht 71.0 in | Wt 207.0 lb

## 2016-06-18 DIAGNOSIS — G4733 Obstructive sleep apnea (adult) (pediatric): Secondary | ICD-10-CM | POA: Diagnosis not present

## 2016-06-18 DIAGNOSIS — I10 Essential (primary) hypertension: Secondary | ICD-10-CM

## 2016-06-18 DIAGNOSIS — I4719 Other supraventricular tachycardia: Secondary | ICD-10-CM

## 2016-06-18 DIAGNOSIS — I471 Supraventricular tachycardia: Secondary | ICD-10-CM | POA: Diagnosis not present

## 2016-06-18 HISTORY — DX: Supraventricular tachycardia: I47.1

## 2016-06-18 HISTORY — DX: Other supraventricular tachycardia: I47.19

## 2016-06-18 MED ORDER — METOPROLOL TARTRATE 25 MG PO TABS: 25.0000 mg | ORAL_TABLET | Freq: Two times a day (BID) | ORAL | 3 refills | Status: DC

## 2016-06-18 NOTE — Progress Notes (Signed)
Cardiology Office Note    Date:  06/18/2016   ID:  Carl Lee, DOB [birth year], MRN [id number]  PCP:  [name], MD  Cardiologist:  Fransico Him, MD   Chief Complaint  Patient presents with  . Sleep Apnea  . Hypertension    History of Present Illness:  Carl Lee is a 60 y.o. male with a history of HTN, severe OSA with an AHI of 35 events per hour with oxygen desaturations as low ast 81% now on CPAP. He is doing well with his CPAP device. He tolerates his nasal pillow mask and feels the pressure is adequate. His daytime sleepiness has improved since starting the CPAP but still feels unrested when he gets up in the am.  He no longer snores and sleeps better with the CPAP.  He has some mouth dryness that is very bothersome and some nasal congestion from allergies.   He has been complaining of skipped heart beats recently and wore a Holter which showed nonsustained atrial tachycardia and is on BB therapy.    Past Medical History:  Diagnosis Date  . AAA (abdominal aortic aneurysm) ([place]) 03/25/12   s/p endovascular repair  . Adenomatous polyp 06/2009  . Atrial tachycardia, paroxysmal ([place]) 06/18/2016  . Bladder cancer ([place]) 08/16/2008  . CAD (coronary artery disease), native coronary artery    S/p 3v CABG 01/2014   . Childhood asthma   . Contact dermatitis    atypical [name])  . COPD (chronic obstructive pulmonary disease) ([place])   . Coronary artery disease    a.2015: CABG w/ LIMA-LAD, RIMA-OM, SVG-RCA b. Cath on 07/10/15 w/ patent LIMA-LAD and RIMA-OM. SVG-RCA occluded but collaterals present  . Diverticulosis 2013   severe by CT and colonoscopy  . Environmental allergies    improved as ages  . Fracture of lateral malleolus of left ankle 08/29/2013  . Hepatic steatosis 06/2015   by [place]  . Hx of migraines   . Hypertension   . Lumbosacral radiculopathy at S1 03/2012   left, with spinal stenosis (MRI 04/2013) improved with TF ESI L5/S1 and S1/2 ([facility])  . OSA  (obstructive sleep apnea) 09/04/2014   Severe with AHI 35/hr  . Prediabetes 06/08/2014   CAD   . Resistance to clopidogrel 2015   drug metabolism panel run - asked to scan  . Spinal stenosis    LS1 nerve root impingement from bulging disc  . Vitamin B12 deficiency   . Zenker diverticulum     Past Surgical History:  Procedure Laterality Date  . ABDOMINAL AORTIC ANEURYSM REPAIR  03/25/12   endovascular  . ABI  05/2013   WNL, L TBI low at 0.66  . Bladder cancer  March 2010 and Oct. 2015   [facility]) x 2  . CARDIAC CATHETERIZATION N/A 07/10/2015   Procedure: Left Heart Cath and Cors/Grafts Angiography;  Surgeon: [name], MD;  Location: [facility];  Service: Cardiovascular;  Laterality: N/A;  . COLONOSCOPY  06/2012   hyperplastic polyp, diverticulosis ([place]) rec rpt 5 yrs  . CORONARY ARTERY BYPASS GRAFT N/A 02/07/2014   Procedure: CORONARY ARTERY BYPASS GRAFTING (CABG);  Surgeon: [name], MD;  Location: [facility];  Service: Open Heart Surgery;  Laterality: N/A;  CABG X 3, BILATERAL LIMA, EVH  . CYSTOSCOPY  08/16/08   Bladder Cancer  . EPIDURAL BLOCK INJECTION Left 09/2014, 10/2014, 12/2014   medial L2,3,4, dorsal L5 ramus blocks x2, L L5/S1 and S1/2 transforaminal ESI ([facility])  .  ESI  04/2013, 06/2013   L L5S1, S12 transforaminal ESI (Dr.  [name])  . ESI Left 04/2014, 05/2014, 06/2014   L5/S1, S1/2; rpt; L4/5  . ESI  03/2016   R L5/S1 interlaminar ESI  . INTRAOPERATIVE TRANSESOPHAGEAL ECHOCARDIOGRAM N/A 02/07/2014   Procedure: INTRAOPERATIVE TRANSESOPHAGEAL ECHOCARDIOGRAM;  Surgeon: [name], MD;  Location: [facility];  Service: Open Heart Surgery;  Laterality: N/A;  . LEFT HEART CATHETERIZATION WITH CORONARY ANGIOGRAM N/A 02/01/2014   Procedure: LEFT HEART CATHETERIZATION WITH CORONARY ANGIOGRAM;  Surgeon: [name], MD;  Location: [facility];  Service: Cardiovascular;  Laterality: N/A;  . PRP epidural injection Left 11/2015    L5/S1, S1/2 transforaminal epidural PRP injections under fluoroscopy ([facility])  . TONSILLECTOMY AND ADENOIDECTOMY  1973  . VASECTOMY      Current Medications: Outpatient Medications Prior to Visit  Medication Sig Dispense Refill  . albuterol (PROAIR HFA) 108 (90 BASE) MCG/ACT inhaler Inhale 2 puffs into the lungs every 6 (six) hours as needed for wheezing or shortness of breath. 1 Inhaler 2  . amLODipine (NORVASC) 5 MG tablet Take 1 tablet (5 mg total) by mouth daily. 30 tablet 11  . aspirin EC 325 MG EC tablet Take 1 tablet (325 mg total) by mouth daily.    [name] atorvastatin (LIPITOR) 20 MG tablet TAKE 1 TABLET (20 MG TOTAL) BY MOUTH DAILY AT 6 PM. 90 tablet 3  . diclofenac sodium (VOLTAREN) 1 % GEL Apply topically 4 (four) times daily as needed. Use as directed    . famotidine (PEPCID) 20 MG tablet TAKE 1 TABLET (20 MG TOTAL) BY MOUTH 2 (TWO) TIMES DAILY. 180 tablet 3  . HYDROcodone-acetaminophen (NORCO/VICODIN) 5-325 MG tablet Take 1 tablet by mouth every 8 (eight) hours as needed for severe pain. 30 tablet 0  . levothyroxine (SYNTHROID, LEVOTHROID) 25 MCG tablet Take 25 mcg by mouth daily before breakfast.    . liothyronine (CYTOMEL) 5 MCG tablet Take 25 mcg by mouth daily.    . magic mouthwash w/lidocaine SOLN Take 5 mLs by mouth 3 (three) times daily as needed for mouth pain. 120 mL 0  . metoprolol tartrate (LOPRESSOR) 25 MG tablet Take 0.5 tablets (12.5 mg total) by mouth 2 (two) times daily. 90 tablet 3  . Testosterone 20 % CREA 1 application by Does not apply route daily.    [name] umeclidinium-vilanterol (ANORO ELLIPTA) 62.5-25 MCG/INH AEPB Inhale 1 puff into the lungs daily. 180 each 3  . vitamin B-12 (CYANOCOBALAMIN) 500 MCG tablet Take 1 tablet (500 mcg total) by mouth daily.     Facility-Administered Medications Prior to Visit  Medication Dose Route Frequency Provider Last Rate Last Dose  . umeclidinium-vilanterol (ANORO ELLIPTA) 62.5-25 MCG/INH 1 puff  1 puff Inhalation Daily  Juanito Doom, MD         Allergies:   Codeine and Doxycycline   Social History   Social History  . Marital status: Married    Spouse name: N/A  . Number of children: 2  . Years of education: N/A   Occupational History  . Self Employed    Social History Main Topics  . Smoking status: Former Smoker    Packs/day: 1.00    Years: 40.00    Types: Cigarettes    Quit date: 02/05/2014  . Smokeless tobacco: Never Used  . Alcohol use 0.0 oz/week     Comment: Rare- states 1 oz. per week  . Drug use: No  . Sexual activity: Not Asked  Other Topics Concern  . 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   ROS:   Please see the history of present illness.    ROS All other systems reviewed and are negative.  No flowsheet data found.     PHYSICAL EXAM:   VS:  BP 130/86   Pulse 64   Ht 5\' 11"  (1.803 m)   Wt 207 lb (93.9 kg)   BMI 28.87 kg/m    GEN: Well nourished, well developed, in no acute distress  HEENT: normal  Neck: no JVD, carotid bruits, or masses Cardiac: RRR; no murmurs, rubs, or gallops,no edema.  Intact distal pulses bilaterally.  Respiratory:  clear to auscultation bilaterally, normal work of breathing GI: soft, nontender, nondistended, + BS MS: no deformity or atrophy  Skin: warm and dry, no rash Neuro:  Alert and Oriented x  3, Strength and sensation are intact Psych: euthymic mood, full affect  Wt Readings from Last 3 Encounters:  06/18/16 207 lb (93.9 kg)  06/11/16 206 lb (93.4 kg)  06/07/16 207 lb (93.9 kg)      Studies/Labs Reviewed:   EKG:  EKG is not ordered today.    Recent Labs: 03/19/2016: BUN 25; Creat 1.16; Hemoglobin 15.7; Platelets 238; Potassium 4.5; Sodium 136; TSH 1.57   Lipid Panel    Component Value Date/Time   CHOL 97 06/20/2015 0910   CHOL 170 01/05/2012   TRIG 83.0 06/20/2015 0910   TRIG 141 01/05/2012   HDL 34.20 (L) 06/20/2015 0910   CHOLHDL 3 06/20/2015 0910   VLDL 16.6 06/20/2015 0910   LDLCALC 46 06/20/2015 0910   LDLDIRECT 105 01/05/2012    Additional studies/ records that were reviewed today include:  none    ASSESSMENT:    1. OSA (obstructive sleep apnea)   2. Essential hypertension   3. Atrial tachycardia, paroxysmal (HCC)      PLAN:  In order of problems listed above:  1.  OSA - the patient is tolerating PAP therapy well without any problems. The PAP download was reviewed today and showed an AHI of 8.1/hr on 16 cm H2O with 83% compliance in using more than 4 hours nightly.  The patient has been using and benefiting from CPAP use and will continue to benefit from therapy. I will get a 2 week autotitration from 4 to 18cm H2O since AHI is too high.  2.  HTN - BP controlled on current meds although he brought in some readings from home that are in the 150/57mmHg range and he feels bad when it gets this high.  Continue amlodipine and increase metoprolol tartrate to 25mg  BID.[name] 3.  Palpitations with nonsustained atrial tachycardia on monitor.  Continue BB.    Medication Adjustments/Labs and Tests Ordered: Current medicines are reviewed at length with the patient today.  Concerns regarding medicines are outlined  above.  Medication changes, Labs and Tests ordered today are listed in the Patient Instructions below.  There are no Patient Instructions on file for  this visit.   Signed, Fransico Him, MD  06/18/2016 8:48 AM    [address], [place], [place]  [address] Phone: [phone]; Fax: [phone]

## 2016-06-18 NOTE — Patient Instructions (Signed)
Medication Instructions:  1) INCREASE METOPROLOL to 25 mg TWICE DAILY  Labwork: None  Testing/Procedures: Dr. Radford Pax has ordered for you a 2 week auto-titration for your PAP.   Follow-Up: Your physician wants you to follow-up in: 1 year with Dr. Radford Pax. You will receive a reminder letter in the mail two months in advance. If you don't receive a letter, please call our office to schedule the follow-up appointment.   Any Other Special Instructions Will Be Listed Below (If Applicable).     If you need a refill on your cardiac medications before your next appointment, please call your pharmacy.

## 2016-06-21 ENCOUNTER — Other Ambulatory Visit: Payer: Self-pay | Admitting: Family Medicine

## 2016-06-21 DIAGNOSIS — K760 Fatty (change of) liver, not elsewhere classified: Secondary | ICD-10-CM

## 2016-06-21 DIAGNOSIS — R7303 Prediabetes: Secondary | ICD-10-CM

## 2016-06-21 DIAGNOSIS — Z125 Encounter for screening for malignant neoplasm of prostate: Secondary | ICD-10-CM

## 2016-06-21 DIAGNOSIS — R5382 Chronic fatigue, unspecified: Secondary | ICD-10-CM

## 2016-06-21 DIAGNOSIS — E785 Hyperlipidemia, unspecified: Secondary | ICD-10-CM

## 2016-06-24 ENCOUNTER — Other Ambulatory Visit (INDEPENDENT_AMBULATORY_CARE_PROVIDER_SITE_OTHER): Payer: BLUE CROSS/BLUE SHIELD

## 2016-06-24 DIAGNOSIS — Z125 Encounter for screening for malignant neoplasm of prostate: Secondary | ICD-10-CM | POA: Diagnosis not present

## 2016-06-24 DIAGNOSIS — K760 Fatty (change of) liver, not elsewhere classified: Secondary | ICD-10-CM | POA: Diagnosis not present

## 2016-06-24 DIAGNOSIS — R7303 Prediabetes: Secondary | ICD-10-CM

## 2016-06-24 DIAGNOSIS — E785 Hyperlipidemia, unspecified: Secondary | ICD-10-CM

## 2016-06-24 DIAGNOSIS — R5382 Chronic fatigue, unspecified: Secondary | ICD-10-CM

## 2016-06-24 LAB — LIPID PANEL
CHOLESTEROL: 99 mg/dL (ref 0–200)
HDL: 40.3 mg/dL (ref >39.00)
LDL CALC: 40 mg/dL (ref 0–99)
NonHDL: 58.48
TRIGLYCERIDES: 93 mg/dL (ref 0.0–149.0)
Total CHOL/HDL Ratio: 2
VLDL: 18.6 mg/dL (ref 0.0–40.0)

## 2016-06-24 LAB — COMPREHENSIVE METABOLIC PANEL
ALBUMIN: 4.1 g/dL (ref 3.5–5.2)
ALT: 7 U/L (ref 0–53)
AST: 9 U/L (ref 0–37)
Alkaline Phosphatase: 90 U/L (ref 39–117)
BILIRUBIN TOTAL: 0.9 mg/dL (ref 0.2–1.2)
BUN: 20 mg/dL (ref 6–23)
CALCIUM: 9.5 mg/dL (ref 8.4–10.5)
CHLORIDE: 104 meq/L (ref 96–112)
CO2: 32 mEq/L (ref 19–32)
CREATININE: 0.81 mg/dL (ref 0.40–1.50)
GFR: 102.55 mL/min (ref >60.00)
Glucose, Bld: 101 mg/dL — ABNORMAL HIGH (ref 70–99)
Potassium: 4.7 mEq/L (ref 3.5–5.1)
Sodium: 141 mEq/L (ref 135–145)
Total Protein: 6.1 g/dL (ref 6.0–8.3)

## 2016-06-24 LAB — TSH: TSH: 0.27 u[IU]/mL — AB (ref 0.35–4.50)

## 2016-06-24 LAB — VITAMIN D 25 HYDROXY (VIT D DEFICIENCY, FRACTURES): VITD: 32.28 ng/mL (ref 30.00–100.00)

## 2016-06-24 LAB — HEMOGLOBIN A1C: HEMOGLOBIN A1C: 6 % (ref 4.6–6.5)

## 2016-06-24 LAB — T4, FREE: Free T4: 0.57 ng/dL — ABNORMAL LOW (ref 0.60–1.60)

## 2016-06-24 LAB — PSA, MEDICARE: PSA: 1.38 ng/mL (ref 0.10–4.00)

## 2016-06-24 LAB — SEDIMENTATION RATE: SED RATE: 1 mm/h (ref 0–20)

## 2016-06-28 ENCOUNTER — Ambulatory Visit (INDEPENDENT_AMBULATORY_CARE_PROVIDER_SITE_OTHER): Payer: BLUE CROSS/BLUE SHIELD | Admitting: Family Medicine

## 2016-06-28 ENCOUNTER — Encounter: Payer: Self-pay | Admitting: Family Medicine

## 2016-06-28 VITALS — BP 118/70 | HR 56 | Ht 68.5 in | Wt 205.6 lb

## 2016-06-28 DIAGNOSIS — Z0000 Encounter for general adult medical examination without abnormal findings: Secondary | ICD-10-CM

## 2016-06-28 DIAGNOSIS — I10 Essential (primary) hypertension: Secondary | ICD-10-CM | POA: Diagnosis not present

## 2016-06-28 DIAGNOSIS — I4719 Other supraventricular tachycardia: Secondary | ICD-10-CM

## 2016-06-28 DIAGNOSIS — E785 Hyperlipidemia, unspecified: Secondary | ICD-10-CM

## 2016-06-28 DIAGNOSIS — M5417 Radiculopathy, lumbosacral region: Secondary | ICD-10-CM

## 2016-06-28 DIAGNOSIS — E538 Deficiency of other specified B group vitamins: Secondary | ICD-10-CM

## 2016-06-28 DIAGNOSIS — I471 Supraventricular tachycardia: Secondary | ICD-10-CM

## 2016-06-28 DIAGNOSIS — J449 Chronic obstructive pulmonary disease, unspecified: Secondary | ICD-10-CM

## 2016-06-28 DIAGNOSIS — R5382 Chronic fatigue, unspecified: Secondary | ICD-10-CM

## 2016-06-28 DIAGNOSIS — G4733 Obstructive sleep apnea (adult) (pediatric): Secondary | ICD-10-CM

## 2016-06-28 DIAGNOSIS — I2510 Atherosclerotic heart disease of native coronary artery without angina pectoris: Secondary | ICD-10-CM

## 2016-06-28 DIAGNOSIS — C679 Malignant neoplasm of bladder, unspecified: Secondary | ICD-10-CM | POA: Diagnosis not present

## 2016-06-28 DIAGNOSIS — R7303 Prediabetes: Secondary | ICD-10-CM

## 2016-06-28 NOTE — Patient Instructions (Addendum)
Check temperature You are doing well - continue current medicines. Follow up with Dr Ace Gins on thyroid levels.  Return as needed or in 6 months for follow up visit.  Health Maintenance, Male A healthy lifestyle and preventative care can promote health and wellness.  Maintain regular health, dental, and eye exams.  Eat a healthy diet. Foods like vegetables, fruits, whole grains, low-fat dairy products, and lean protein foods contain the nutrients you need and are low in calories. Decrease your intake of foods high in solid fats, added sugars, and salt. Get information about a proper diet from your health care provider, if necessary.  Regular physical exercise is one of the most important things you can do for your health. Most adults should get at least 150 minutes of moderate-intensity exercise (any activity that increases your heart rate and causes you to sweat) each week. In addition, most adults need muscle-strengthening exercises on 2 or more days a week.   Maintain a healthy weight. The body mass index (BMI) is a screening tool to identify possible weight problems. It provides an estimate of body fat based on height and weight. Your health care provider can find your BMI and can help you achieve or maintain a healthy weight. For males 20 years and older:  A BMI below 18.5 is considered underweight.  A BMI of 18.5 to 24.9 is normal.  A BMI of 25 to 29.9 is considered overweight.  A BMI of 30 and above is considered obese.  Maintain normal blood lipids and cholesterol by exercising and minimizing your intake of saturated fat. Eat a balanced diet with plenty of fruits and vegetables. Blood tests for lipids and cholesterol should begin at age 40 and be repeated every 5 years. If your lipid or cholesterol levels are high, you are over age 19, or you are at high risk for heart disease, you may need your cholesterol levels checked more frequently.Ongoing high lipid and cholesterol levels  should be treated with medicines if diet and exercise are not working.  If you smoke, find out from your health care provider how to quit. If you do not use tobacco, do not start.  Lung cancer screening is recommended for adults aged 16-80 years who are at high risk for developing lung cancer because of a history of smoking. A yearly low-dose CT scan of the lungs is recommended for people who have at least a 30-pack-year history of smoking and are current smokers or have quit within the past 15 years. A pack year of smoking is smoking an average of 1 pack of cigarettes a day for 1 year (for example, a 30-pack-year history of smoking could mean smoking 1 pack a day for 30 years or 2 packs a day for 15 years). Yearly screening should continue until the smoker has stopped smoking for at least 15 years. Yearly screening should be stopped for people who develop a health problem that would prevent them from having lung cancer treatment.  If you choose to drink alcohol, do not have more than 2 drinks per day. One drink is considered to be 12 oz (360 mL) of beer, 5 oz (150 mL) of wine, or 1.5 oz (45 mL) of liquor.  Avoid the use of street drugs. Do not share needles with anyone. Ask for help if you need support or instructions about stopping the use of drugs.  High blood pressure causes heart disease and increases the risk of stroke. High blood pressure is more likely to develop  in:  People who have blood pressure in the end of the normal range (100-139/85-89 mm Hg).  People who are overweight or obese.  People who are African American.  If you are 13-28 years of age, have your blood pressure checked every 3-5 years. If you are 32 years of age or older, have your blood pressure checked every year. You should have your blood pressure measured twice-once when you are at a hospital or clinic, and once when you are not at a hospital or clinic. Record the average of the two measurements. To check your blood  pressure when you are not at a hospital or clinic, you can use:  An automated blood pressure machine at a pharmacy.  A home blood pressure monitor.  If you are 1-34 years old, ask your health care provider if you should take aspirin to prevent heart disease.  Diabetes screening involves taking a blood sample to check your fasting blood sugar level. This should be done once every 3 years after age 105 if you are at a normal weight and without risk factors for diabetes. Testing should be considered at a younger age or be carried out more frequently if you are overweight and have at least 1 risk factor for diabetes.  Colorectal cancer can be detected and often prevented. Most routine colorectal cancer screening begins at the age of 97 and continues through age 67. However, your health care provider may recommend screening at an earlier age if you have risk factors for colon cancer. On a yearly basis, your health care provider may provide home test kits to check for hidden blood in the stool. A small camera at the end of a tube may be used to directly examine the colon (sigmoidoscopy or colonoscopy) to detect the earliest forms of colorectal cancer. Talk to your health care provider about this at age 70 when routine screening begins. A direct exam of the colon should be repeated every 5-10 years through age 57, unless early forms of precancerous polyps or small growths are found.  People who are at an increased risk for hepatitis B should be screened for this virus. You are considered at high risk for hepatitis B if:  You were born in a country where hepatitis B occurs often. Talk with your health care provider about which countries are considered high risk.  Your parents were born in a high-risk country and you have not received a shot to protect against hepatitis B (hepatitis B vaccine).  You have HIV or AIDS.  You use needles to inject street drugs.  You live with, or have sex with, someone who  has hepatitis B.  You are a man who has sex with other men (MSM).  You get hemodialysis treatment.  You take certain medicines for conditions like cancer, organ transplantation, and autoimmune conditions.  Hepatitis C blood testing is recommended for all people born from [birth year] through 1967 and any individual with known risk factors for hepatitis C.  Healthy men should no longer receive prostate-specific antigen (PSA) blood tests as part of routine cancer screening. Talk to your health care provider about prostate cancer screening.  Testicular cancer screening is not recommended for adolescents or adult males who have no symptoms. Screening includes self-exam, a health care provider exam, and other screening tests. Consult with your health care provider about any symptoms you have or any concerns you have about testicular cancer.  Practice safe sex. Use condoms and avoid high-risk sexual practices to reduce the  spread of sexually transmitted infections (STIs).  You should be screened for STIs, including gonorrhea and chlamydia if:  You are sexually active and are younger than 24 years.  You are older than 24 years, and your health care provider tells you that you are at risk for this type of infection.  Your sexual activity has changed since you were last screened, and you are at an increased risk for chlamydia or gonorrhea. Ask your health care provider if you are at risk.  If you are at risk of being infected with HIV, it is recommended that you take a prescription medicine daily to prevent HIV infection. This is called pre-exposure prophylaxis (PrEP). You are considered at risk if:  You are a man who has sex with other men (MSM).  You are a heterosexual man who is sexually active with multiple partners.  You take drugs by injection.  You are sexually active with a partner who has HIV.  Talk with your health care provider about whether you are at high risk of being infected with  HIV. If you choose to begin PrEP, you should first be tested for HIV. You should then be tested every 3 months for as long as you are taking PrEP.  Use sunscreen. Apply sunscreen liberally and repeatedly throughout the day. You should seek shade when your shadow is shorter than you. Protect yourself by wearing long sleeves, pants, a wide-brimmed hat, and sunglasses year round whenever you are outdoors.  Tell your health care provider of new moles or changes in moles, especially if there is a change in shape or color. Also, tell your health care provider if a mole is larger than the size of a pencil eraser.  A one-time screening for abdominal aortic aneurysm (AAA) and surgical repair of large AAAs by ultrasound is recommended for men aged 50-75 years who are current or former smokers.  Stay current with your vaccines (immunizations). This information is not intended to replace advice given to you by your health care provider. Make sure you discuss any questions you have with your health care provider. Document Released: 12/25/2007 Document Revised: 07/19/2014 Document Reviewed: 04/01/2015 Elsevier Interactive Patient Education  2017 [employer].

## 2016-06-28 NOTE — Assessment & Plan Note (Signed)
Controlled on current regimen.   

## 2016-06-28 NOTE — Progress Notes (Signed)
BP 118/70   Pulse (!) 56   Ht 5' 8.5" (1.74 m)   Wt 205 lb 9.6 oz (93.3 kg)   SpO2 99%   BMI 30.81 kg/m    CC: CPE Subjective:    Patient ID: Carl Lee, male    DOB: 03-30-1956, 60 y.o.   MRN: [id number]  HPI: [name] is a 60 y.o. male presenting on 06/28/2016 for Annual Exam (chest "still feels sore" along with some SOB on exertion, has had a cough for a couple weeks. had 2 toenails removed last tuesday. )   OSA on CPAP with nasal pillow mask followed by cardiology Dr Fransico Him. Recent holter showed NS atrial tachycardia, currently on beta blocker therapy.  COPD followed by pulm Dr [name]. Started on anoro.   Ongoing cough over last 3 wks. Started with throat ulcers and congestion. RST was negative on evaluation for this here. Mildly productive cough. Known zenker's diverticulum. No fevers/chills. Taking OTC cough syrup and cough drops.   Toenails removed 1 wk ago.   On DHEA, testosterone cream, levothyroxine and cytomel by PM&R. PRP significantly helped chronic back pain.   Preventative: Colonoscopy 06/2012 hyperplastic polyp, diverticulosis Ardis Hughs) rec rpt 5 yrs Prostate cancer screening - no fmhx. Screen every few years (last 2016).  Flu shot - yearly Pneumovax 06/2014, prevnar 04/2015 Tdap 12/2012 zostavax - 12/2015 Advanced directive - discussed.  Seat belt use discussed Sunscreen use discussed, wants mole on back evaluated Ex smoker - quit 2015 Alcohol - 1 drink per month  Caffeine: 1 cup coffee/day  Lives with wife, 1 dog  Grown children  Occupation: Therapist, sports - self employed  Edu: 2 yrs college  Activity: fishing, walking occasionally  Diet: good water, fruits/vegetables daily   Relevant past medical, surgical, family and social history reviewed and updated as indicated. Interim medical history since our last visit reviewed. Allergies and medications reviewed and updated. Current Outpatient Prescriptions on File Prior to Visit    Medication Sig  . albuterol (PROAIR HFA) 108 (90 BASE) MCG/ACT inhaler Inhale 2 puffs into the lungs every 6 (six) hours as needed for wheezing or shortness of breath.  [name] amLODipine (NORVASC) 5 MG tablet Take 1 tablet (5 mg total) by mouth daily.  [name] aspirin EC 325 MG EC tablet Take 1 tablet (325 mg total) by mouth daily.  [name] atorvastatin (LIPITOR) 20 MG tablet TAKE 1 TABLET (20 MG TOTAL) BY MOUTH DAILY AT 6 PM.  . DHEA 50 MG CAPS Take 2 capsules (100 mg total) by mouth daily.  . diclofenac sodium (VOLTAREN) 1 % GEL Apply topically 4 (four) times daily as needed. Use as directed  . famotidine (PEPCID) 20 MG tablet TAKE 1 TABLET (20 MG TOTAL) BY MOUTH 2 (TWO) TIMES DAILY.  [name] HYDROcodone-acetaminophen (NORCO/VICODIN) 5-325 MG tablet Take 1 tablet by mouth every 8 (eight) hours as needed for severe pain.  [name] levothyroxine (SYNTHROID, LEVOTHROID) 25 MCG tablet Take 25 mcg by mouth daily before breakfast.  . liothyronine (CYTOMEL) 5 MCG tablet Take 25 mcg by mouth daily.  . metoprolol tartrate (LOPRESSOR) 25 MG tablet Take 1 tablet (25 mg total) by mouth 2 (two) times daily.  . Testosterone 20 % CREA 1 application by Does not apply route 2 (two) times daily.   [name] umeclidinium-vilanterol (ANORO ELLIPTA) 62.5-25 MCG/INH AEPB Inhale 1 puff into the lungs daily.  . vitamin B-12 (CYANOCOBALAMIN) 500 MCG tablet Take 1 tablet (500 mcg total) by mouth daily.   Current Facility-Administered  Medications on File Prior to Visit  Medication  . umeclidinium-vilanterol (ANORO ELLIPTA) 62.5-25 MCG/INH 1 puff    Review of Systems  Constitutional: Negative for activity change, appetite change, chills, fatigue, fever and unexpected weight change.  HENT: Negative for hearing loss.   Eyes: Positive for visual disturbance (occasional blurry vision).  Respiratory: Positive for cough and shortness of breath. Negative for chest tightness and wheezing.   Cardiovascular: Positive for chest pain (ongoing chest soreness -  declines neuropathic medication for this) and leg swelling (intermittent). Negative for palpitations.  Gastrointestinal: Negative for abdominal distention, abdominal pain, blood in stool, constipation, diarrhea, nausea and vomiting.  Genitourinary: Negative for difficulty urinating and hematuria.  Musculoskeletal: Negative for arthralgias, myalgias and neck pain.  Skin: Negative for rash.  Neurological: Negative for dizziness, seizures, syncope and headaches.  Hematological: Negative for adenopathy. Bruises/bleeds easily.  Psychiatric/Behavioral: Negative for dysphoric mood. The patient is not nervous/anxious.    Per HPI unless specifically indicated in ROS section     Objective:    BP 118/70   Pulse (!) 56   Ht 5' 8.5" (1.74 m)   Wt 205 lb 9.6 oz (93.3 kg)   SpO2 99%   BMI 30.81 kg/m   Wt Readings from Last 3 Encounters:  06/28/16 205 lb 9.6 oz (93.3 kg)  06/18/16 207 lb (93.9 kg)  06/11/16 206 lb (93.4 kg)    Physical Exam  Constitutional: He is oriented to person, place, and time. He appears well-developed and well-nourished. No distress.  HENT:  Head: Normocephalic and atraumatic.  Right Ear: Hearing, tympanic membrane, external ear and ear canal normal.  Left Ear: Hearing, tympanic membrane, external ear and ear canal normal.  Nose: Nose normal.  Mouth/Throat: Uvula is midline, oropharynx is clear and moist and mucous membranes are normal. No oropharyngeal exudate, posterior oropharyngeal edema or posterior oropharyngeal erythema.  Eyes: Conjunctivae and EOM are normal. Pupils are equal, round, and reactive to light. No scleral icterus.  Neck: Normal range of motion. Neck supple. Carotid bruit is not present. No thyromegaly present.  Cardiovascular: Normal rate, regular rhythm, normal heart sounds and intact distal pulses.   No murmur heard. Pulses:      Radial pulses are 2+ on the right side, and 2+ on the left side.  Pulmonary/Chest: Effort normal and breath sounds  normal. No respiratory distress. He has no wheezes. He has no rales.  Abdominal: Soft. Bowel sounds are normal. He exhibits no distension and no mass. There is no tenderness. There is no rebound and no guarding.  Musculoskeletal: Normal range of motion. He exhibits no edema.  Lymphadenopathy:    He has no cervical adenopathy.  Neurological: He is alert and oriented to person, place, and time.  CN grossly intact, station and gait intact  Skin: Skin is warm and dry. No rash noted.  Psychiatric: He has a normal mood and affect. His behavior is normal. Judgment and thought content normal.  Nursing note and vitals reviewed.  Results for orders placed or performed in visit on 06/24/16  Comprehensive metabolic panel  Result Value Ref Range   Sodium 141 135 - 145 mEq/L   Potassium 4.7 3.5 - 5.1 mEq/L   Chloride 104 96 - 112 mEq/L   CO2 32 19 - 32 mEq/L   Glucose, Bld 101 (H) 70 - 99 mg/dL   BUN 20 6 - 23 mg/dL   Creatinine, Ser 0.81 0.40 - 1.50 mg/dL   Total Bilirubin 0.9 0.2 - 1.2 mg/dL  Alkaline Phosphatase 90 39 - 117 U/L   AST 9 0 - 37 U/L   ALT 7 0 - 53 U/L   Total Protein 6.1 6.0 - 8.3 g/dL   Albumin 4.1 3.5 - 5.2 g/dL   Calcium 9.5 8.4 - 10.5 mg/dL   GFR 102.55 >60.00 mL/min  Lipid panel  Result Value Ref Range   Cholesterol 99 0 - 200 mg/dL   Triglycerides 93.0 0.0 - 149.0 mg/dL   HDL 40.30 >39.00 mg/dL   VLDL 18.6 0.0 - 40.0 mg/dL   LDL Cholesterol 40 0 - 99 mg/dL   Total CHOL/HDL Ratio 2    NonHDL 58.48   TSH  Result Value Ref Range   TSH 0.27 (L) 0.35 - 4.50 uIU/mL  Hemoglobin A1c  Result Value Ref Range   Hgb A1c MFr Bld 6.0 4.6 - 6.5 %  T4, free  Result Value Ref Range   Free T4 0.57 (L) 0.60 - 1.60 ng/dL  VITAMIN D 25 Hydroxy (Vit-D Deficiency, Fractures)  Result Value Ref Range   VITD 32.28 30.00 - 100.00 ng/mL  PSA, Medicare  Result Value Ref Range   PSA 1.38 0.10 - 4.00 ng/ml  Sedimentation rate  Result Value Ref Range   Sed Rate 1 0 - 20 mm/hr        Assessment & Plan:   Problem List Items Addressed This Visit    Atrial tachycardia, paroxysmal (HCC)    Controlled on current regimen.       Bladder cancer ([place])    Continues yearly f/u with urology       CAD (coronary artery disease), native coronary artery    Appreciate cards care.       Chronic fatigue    Pt will f/u with PMR re thyroid and hormone replacement.       COPD, moderate ([place])    Appreciate pulm care. Continues anoro.      Dyslipidemia    Chronic, stable. Continue current regimen.       Healthcare maintenance - Primary    Preventative protocols reviewed and updated unless pt declined. Discussed healthy diet and lifestyle.       Hypertension    Chronic, stable. Continue current regimen.       Lumbosacral radiculopathy at S1    Marked improvement with PRP       OSA (obstructive sleep apnea)    Appreciate cards care of patient. Continue CPAP      Prediabetes    Chronic, stable. Encouraged avoid added sugars.       Vitamin B12 deficiency    Continue b12 orally daily.          Follow up plan: Return in about 6 months (around 12/27/2016) for follow up visit.  [name], MD

## 2016-06-28 NOTE — Assessment & Plan Note (Signed)
Marked improvement with PRP

## 2016-06-28 NOTE — Progress Notes (Signed)
Pre visit review using our clinic review tool, if applicable. No additional management support is needed unless otherwise documented below in the visit note. 

## 2016-06-28 NOTE — Assessment & Plan Note (Signed)
Pt will f/u with PMR re thyroid and hormone replacement.

## 2016-06-28 NOTE — Assessment & Plan Note (Addendum)
Continues yearly f/u with urology

## 2016-06-28 NOTE — Assessment & Plan Note (Signed)
Appreciate pulm care. Continues anoro.

## 2016-06-28 NOTE — Assessment & Plan Note (Signed)
Chronic, stable. Continue current regimen. 

## 2016-06-28 NOTE — Assessment & Plan Note (Signed)
Continue b12 orally daily.

## 2016-06-28 NOTE — Assessment & Plan Note (Signed)
Appreciate cards care of patient. Continue CPAP

## 2016-06-28 NOTE — Assessment & Plan Note (Signed)
Chronic, stable. Encouraged avoid added sugars.

## 2016-06-28 NOTE — Assessment & Plan Note (Signed)
Preventative protocols reviewed and updated unless pt declined. Discussed healthy diet and lifestyle.  

## 2016-06-28 NOTE — Assessment & Plan Note (Signed)
Appreciate cards care. 

## 2016-08-05 ENCOUNTER — Telehealth: Payer: Self-pay | Admitting: Cardiology

## 2016-08-05 DIAGNOSIS — G4733 Obstructive sleep apnea (adult) (pediatric): Secondary | ICD-10-CM

## 2016-08-05 NOTE — Telephone Encounter (Signed)
New Message     Pt is calling for his sleep apnea results from December 02/2016 ?

## 2016-08-06 NOTE — Telephone Encounter (Signed)
Called the patient and he told me he had a cpap titration done in Dec and had not heard back about the results. I didn't see any results in my box for him so I called [facility] respiratory dept. and spoke to carmen who is going to fax the results to me so I can get them scanned in then get it to dr turner to read. I notified the patient that I found his study.

## 2016-08-07 NOTE — Telephone Encounter (Signed)
AHI still too high at 8.  Please change CPAP to 15cm H2O and repeat download in 2 weeks

## 2016-08-09 ENCOUNTER — Telehealth: Payer: Self-pay | Admitting: *Deleted

## 2016-08-09 ENCOUNTER — Telehealth: Payer: Self-pay | Admitting: Family Medicine

## 2016-08-09 DIAGNOSIS — E538 Deficiency of other specified B group vitamins: Secondary | ICD-10-CM

## 2016-08-09 DIAGNOSIS — R5382 Chronic fatigue, unspecified: Secondary | ICD-10-CM

## 2016-08-09 NOTE — Telephone Encounter (Signed)
Patient had a physical in December and his thyroid was low.  Patient was going to discuss it with his endocrinologist, but she no longer takes his insurance.  Patient wants to know if Dr.Gutierrez would see him for his thyroid, or if he needs to be referred to another endocrinologist. If he needs to go to another endocrinologist, patient would like Dr.Gutierrez' suggestion for who he should go to for a referral.

## 2016-08-10 ENCOUNTER — Telehealth: Payer: Self-pay | Admitting: *Deleted

## 2016-08-10 DIAGNOSIS — G4733 Obstructive sleep apnea (adult) (pediatric): Secondary | ICD-10-CM

## 2016-08-10 NOTE — Telephone Encounter (Signed)
Called the patient and told him dr turner had made a correction to his settings and recommendation she made earlier, he verbalized understanding and agreed.

## 2016-08-10 NOTE — Telephone Encounter (Signed)
-----   Message from Sueanne Margarita, MD sent at 08/07/2016  4:37 PM EST ----- Correction - please disregard earlier order.  Please set CPAP on 16cm H2O, avoid sleeping supine and get repeat download in 2 weeks

## 2016-08-10 NOTE — Telephone Encounter (Signed)
Spoke with patient. He will await call for referral and said that Dr. [name] had him on levothyroxine and something else for his thyroid. He said he has stopped the thyroid meds along with his testosterone because he was just feeling bad. Since stopping them, he feels much better. He will wait to see what Dr. Cruzita Lederer advises.

## 2016-08-10 NOTE — Telephone Encounter (Signed)
Late Entry:  Called the patient and gave him his result and dr turner's recommendations, he verbalized understanding and agreed.

## 2016-08-10 NOTE — Telephone Encounter (Signed)
Does he mean Dr [name]? I was unclear about his hypothyroidism to start - labs showed over-treatment on thyroid medicines.  Would suggest Dr Cruzita Lederer endocrinologist for evaluation. Referral placed.

## 2016-08-10 NOTE — Addendum Note (Signed)
Addended by: Freada Bergeron on: 08/10/2016 10:37 AM   Modules accepted: Orders

## 2016-08-11 ENCOUNTER — Encounter (INDEPENDENT_AMBULATORY_CARE_PROVIDER_SITE_OTHER): Payer: Self-pay | Admitting: Physical Medicine and Rehabilitation

## 2016-08-11 ENCOUNTER — Ambulatory Visit (INDEPENDENT_AMBULATORY_CARE_PROVIDER_SITE_OTHER): Payer: PPO | Admitting: Physical Medicine and Rehabilitation

## 2016-08-11 VITALS — BP 139/85 | HR 52

## 2016-08-11 DIAGNOSIS — M25552 Pain in left hip: Secondary | ICD-10-CM

## 2016-08-11 DIAGNOSIS — G894 Chronic pain syndrome: Secondary | ICD-10-CM | POA: Diagnosis not present

## 2016-08-11 DIAGNOSIS — M5010 Cervical disc disorder with radiculopathy, unspecified cervical region: Secondary | ICD-10-CM | POA: Diagnosis not present

## 2016-08-11 DIAGNOSIS — M25512 Pain in left shoulder: Secondary | ICD-10-CM | POA: Diagnosis not present

## 2016-08-11 DIAGNOSIS — M5416 Radiculopathy, lumbar region: Secondary | ICD-10-CM

## 2016-08-11 DIAGNOSIS — G8929 Other chronic pain: Secondary | ICD-10-CM

## 2016-08-11 NOTE — Progress Notes (Signed)
Carl Lee - 61 y.o. male MRN [id number]  Date of birth: 1955-09-04  Office Visit Note: Visit Date: 08/11/2016 PCP: [name], MD Referred by: [name], MD  Subjective: Chief Complaint  Patient presents with  . Neck - Pain  . Left Hip - Pain  . Lower Back - Pain   HPI: [name] is a 61 year old gentleman that comes in today with multiple pain complaints and musculoskeletal pain. He has been seen and followed by Dr. Neomia Dear who is a physiatry wrist in town that does spine care as well as some form of rejuvenation clinic looking at chronic fatigue and hormone replacement etc. I do have about 40 pages of her notes to review which is been helpful. His biggest complaint is his left hip pain. He describes this as low back pain with referral into the groin and thigh. It is worse with hip flexion and movement. He however also has a radicular pattern and more of an L5 distribution down to the top and side of the foot and more of an L5 and S1 distribution. He has been getting L5-S1 interlaminar epidural injections with Kenalog off and on with some relief but they're not helping at this point. The last injection didn't seem to help his hip pain very much at all. He is also had PRP injections in the cervical spine as well as lumbar nerve roots in various facet joints. Per Dr. Anselmo Rod notes he is in the remodeling phase of the PRP injections. I did tell him that honestly I did not know much about PRP as I think the sciences not adequate at this point to really prove or disprove that they help and to prove or disprove which weigh is the best way to inject did before when to inject it or want to inject. He does wear that it has helped his back pain tremendously. Regardless his left hip is still problematic with this tingling paresthesia. The left hip pain has been really present for about a year. Lumbar spine is reviewed below. There is a small area at L5-S1 with a lateral to  foraminal disc osteophyte complex that could irritate the left side at L5 or S1 nerve root. There is no focal compression. This MRI is problematic because of large AAA stent and subsequent artifact in the MRI.  His second major complaint is chronic worsening severe neck pain worse with rotation. The left neck pain can actually be worse with right rotation as well as forward flexion and extension. Again the neck pain has been present for many years. He does note some popping and clicking at times. It can radiate into the left shoulder and shoulder blade area and can radiate actually up into the posterior part of the head. Dr. [name] basically diagnosed some occipital neuralgia. The PRP in the cervical spine evidently has not helped his neck at this point. I did review the cervical MRI which is below as well. He has a left paracentral disc protrusion which is small and not present at C5-6. Obviously this could reproduce some pain in the left shoulder. He has no focal compression he is multilevel small changes in the foramen with facet arthropathy.   He continues to take Vicodin on a daily basis a small amount. I did express that we are not a pain clinic here so we would not take over any opioid medications. He has not noted any specific injuries to his cervical or lumbar spine recently. He does report a  remote injury. He also reports working a lot being on his feet for many years. He denies any focal weakness but feels tired and fatigued. He recently had a yearly exam by his primary care doctor and I did review his notes as well as his lab values.    Review of Systems  Constitutional: Negative for chills, fever, malaise/fatigue and weight loss.  HENT: Negative for hearing loss and sinus pain.   Eyes: Negative for blurred vision, double vision and photophobia.  Respiratory: Negative for cough and shortness of breath.   Cardiovascular: Negative for chest pain, palpitations and leg swelling.    Gastrointestinal: Negative for abdominal pain, nausea and vomiting.  Genitourinary: Negative for flank pain.  Musculoskeletal: Positive for back pain and neck pain. Negative for myalgias.  Skin: Negative for itching and rash.  Neurological: Positive for tingling. Negative for tremors, focal weakness and weakness.  Endo/Heme/Allergies: Negative.   Psychiatric/Behavioral: Negative for depression.  All other systems reviewed and are negative.  Otherwise per HPI.  Assessment & Plan: Visit Diagnoses:  1. Pain in left hip   2. Lumbar radiculopathy   3. Cervical disc disorder with radiculopathy   4. Chronic left shoulder pain   5. Chronic pain syndrome     Plan: Findings:  Chronic worsening severe low back and left hip and buttock pain. He distinguishes the left hip and leg from his back pain. He says the back pain is better after the PRP injections. He distinguishes the left hip and leg pain as some pain that radiates down the leg in L5 distribution with paresthesia but also pain with rotation of the hip. I think he has both problems in all likelihood. The L5 radicular pattern can be manifested from a hip problem is the sciatic nerve does innervate the posterior part of the capsule. However he does get paresthesias which does make you think more of a nerve root indication. We did obtain x-rays of the hip and he does have pretty significant left hip arthritis. We will bring him back for a diagnostic and hopefully therapeutic hip arthrogram on the left side. If that's very beneficial but short lived would have him look to one of our orthopedic partners for evaluation and management of his hip. If he gets some relief but he still having the radicular complaints I would look at an L5 transforaminal injection. I did not see any L5 transforaminal injections performed by Dr. [name]. In terms of his neck pain at this point I would consider this more of a myofascial type pain but the underlying disc  herniation could be causing some nerve root irritation. C7-T1 intralaminar injection could be considered is had these in the past. He may still be in this "remodeling phase "of the PRP but it's something I just don't quite understand at this point. Nonetheless it is something we could look at with trigger point injection versus epidural injection. I spent more than 45 minutes speaking face-to-face with the patient with 50% of the time in counseling.    Meds & Orders: No orders of the defined types were placed in this encounter.  No orders of the defined types were placed in this encounter.   Follow-up: Return for Diagnostic and therapeutic left hip injection with fluoroscopic guidance..   Procedures: No procedures performed  No notes on file   Clinical History: 11/27/2015 FINDINGS: Severe susceptibility artifact resulting from the aorto bi-iliac stent graft which obscures portions of the lumbar spine suspect she on the STIR images.  Segmentation: Standard.  Alignment: Physiologic.  Vertebrae: Limited evaluation secondary to cystitis did of the artifact. No fracture, evidence of discitis, or bone lesion.  Conus medullaris: Extends to the L1 level and appears normal.  Paraspinal and other soft tissues: Severe susceptibility artifact resulting from the aorto bi-iliac stent graft .  Disc levels:  Disc spaces: Disc heights are relatively well maintained, but evaluation is limited.  T12-L1: No significant disc protrusion. No evidence of neural foraminal stenosis. No central canal stenosis.  L1-L2: Mild broad-based disc bulge. Mild bilateral facet arthropathy. No evidence of neural foraminal stenosis. No central canal stenosis.  L2-L3: Small central disc protrusion. Mild bilateral facet arthropathy. No evidence of neural foraminal stenosis. No central canal stenosis.  L3-L4: Moderate broad-based disc bulge flattening the ventral thecal sac. Mild bilateral facet  arthropathy. Mild spinal stenosis. No foraminal stenosis.  L4-L5: Moderate broad-based disc bulge eccentric towards the right. Moderate bilateral facet arthropathy. Mild -moderate spinal stenosis. No evidence of neural foraminal stenosis.  L5-S1: Broad-based disc bulge with a left paracentral disc protrusion abutting the left intraspinal S1 nerve root. No evidence of neural foraminal stenosis. No central canal stenosis.  FINDINGS: The craniocervical junction appears unremarkable. No vertebral subluxation is observed. No significant abnormal spinal cord signal is observed. No significant vertebral marrow edema is identified.  Intervertebral disc desiccation is observed at all levels in the cervical spine.  Additional findings at individual levels are as follows:  C2-3:  Unremarkable.  C3-4: Mild bilateral foraminal stenosis and mild central narrowing of the thecal sac eccentric to the left due to left eccentric disc bulge, and facet arthropathy.  C4-5: Mild right foraminal stenosis due to uncinate and facet spurring.  C5-6: Moderate right and borderline left foraminal stenosis due to disc bulge, uncinate spurring, and facet spurring. The previous left paracentral disc protrusion at this level was less apparent.  C6-7: Mild bilateral foraminal stenosis and borderline central narrowing of the thecal sac due to disc bulge and facet arthropathy.  C7-T1: Mild bilateral foraminal stenosis due to disc bulge, and uncinate spurring.  He reports that he quit smoking about 2 years ago. His smoking use included Cigarettes. He has a 40.00 pack-year smoking history. He has never used smokeless tobacco.   Recent Labs  06/24/16 0843  HGBA1C 6.0    Objective:  VS:  HT:    WT:   BMI:     BP:139/85  HR:(!) 52bpm  TEMP: ( )  RESP:  Physical Exam  Constitutional: He is oriented to person, place, and time. He appears well-developed and well-nourished. No distress.  HENT:    Head: Normocephalic and atraumatic.  Nose: Nose normal.  Mouth/Throat: Oropharynx is clear and moist.  Eyes: Conjunctivae are normal. Pupils are equal, round, and reactive to light.  Neck: Normal range of motion. Neck supple. No tracheal deviation present. No thyromegaly present.  Cardiovascular: Regular rhythm and intact distal pulses.   Pulmonary/Chest: Effort normal and breath sounds normal.  Abdominal: Soft. He exhibits no distension.  Musculoskeletal: He exhibits no deformity.  Examination of the lumbar spine shows that the patient stands with forward flexion. He does have pain with extension of the lumbar spine. He has concordant pain however mostly with hip rotation internally and it's exquisitely tender. He has a negative slump test bilaterally with good distal strength. He does have some impaired sensation in L5 dermatome. This is on the left. He has no clonus bilaterally. Examination of the cervical spine shows forward flexed cervical spine. He has  trigger points that are palpable in the levator scapula and trapezius particular on the left. He has mild shoulder impingement signs. He has a negative drop arm test. He has good strength in the upper tremor is bilaterally and a negative Hoffmann's test bilaterally.  Lymphadenopathy:    He has no cervical adenopathy.  Neurological: He is alert and oriented to person, place, and time.  Skin: Skin is warm. No rash noted.  Psychiatric: He has a normal mood and affect. His behavior is normal.  Nursing note and vitals reviewed.   Ortho Exam Imaging: No results found.  Past Medical/Family/Surgical/Social History: Medications & Allergies reviewed per EMR Patient Active Problem List   Diagnosis Date Noted  . Atrial tachycardia, paroxysmal ([place]) 06/18/2016  . Chronic fatigue 04/27/2016  . CAD (coronary artery disease), native coronary artery   . Abdominal discomfort 06/24/2015  . Hepatic steatosis 06/12/2015  . Right trigeminal neuralgia  11/27/2014  . Chest pain 10/30/2014  . Vitamin B12 deficiency   . OSA (obstructive sleep apnea) 09/04/2014  . Headache 06/20/2014  . Prediabetes 06/08/2014  . Dysphagia 04/19/2014  . Zenker's diverticulum 04/19/2014  . COPD, moderate ([place]) 03/26/2014  . S/P CABG x 3 02/07/2014  . Occlusion and stenosis of vertebral artery without mention of cerebral infarction 11/12/2013  . Skin rash 04/17/2013  . Healthcare maintenance 01/09/2013  . Dyslipidemia 01/09/2013  . Lumbosacral radiculopathy at S1 09/14/2012  . Hx of migraines   . Arthritis   . History of repair of aneurysm of abdominal aorta using endovascular stent graft 05/01/2012  . Adenomatous polyps 03/24/2012  . Ex-smoker 03/24/2012  . Hypertension 03/24/2012  . Bladder cancer ([place]) 03/24/2012   Past Medical History:  Diagnosis Date  . AAA (abdominal aortic aneurysm) ([place]) 03/25/12   s/p endovascular repair  . Adenomatous polyp 06/2009  . Atrial tachycardia, paroxysmal ([place]) 06/18/2016  . Bladder cancer ([place]) 08/16/2008  . CAD (coronary artery disease), native coronary artery    S/p 3v CABG 01/2014   . Childhood asthma   . Contact dermatitis    atypical [name])  . COPD (chronic obstructive pulmonary disease) ([place])   . Coronary artery disease    a.2015: CABG w/ LIMA-LAD, RIMA-OM, SVG-RCA b. Cath on 07/10/15 w/ patent LIMA-LAD and RIMA-OM. SVG-RCA occluded but collaterals present  . Diverticulosis 2013   severe by CT and colonoscopy  . Environmental allergies    improved as ages  . Fracture of lateral malleolus of left ankle 08/29/2013  . Hepatic steatosis 06/2015   by [place]  . Hx of migraines   . Hypertension   . Lumbosacral radiculopathy at S1 03/2012   left, with spinal stenosis (MRI 04/2013) improved with TF ESI L5/S1 and S1/2 ([place])  . OSA (obstructive sleep apnea) 09/04/2014   Severe with AHI 35/hr  . Prediabetes 06/08/2014   CAD   . Resistance to clopidogrel 2015   drug metabolism panel run - asked to  scan  . Spinal stenosis    LS1 nerve root impingement from bulging disc  . Vitamin B12 deficiency   . Zenker diverticulum    Family History  Problem Relation Age of Onset  . Diabetes Mother   . Arrhythmia Mother     pacemaker  . Cancer Mother     Bladder  . COPD Mother   . Thyroid disease Mother   . Hyperlipidemia Mother   . Hypertension Mother   . Diabetes Father   . CAD Father 15    CHF, MI  .  Dementia Father   . Heart attack Father   . Diabetes Sister   . Diabetes Brother   . Hypertension Brother   . Heart attack Maternal Grandmother   . Colon cancer Neg Hx    Past Surgical History:  Procedure Laterality Date  . ABDOMINAL AORTIC ANEURYSM REPAIR  03/25/12   endovascular  . ABI  05/2013   WNL, L TBI low at 0.66  . Bladder cancer  March 2010 and Oct. 2015   [facility] Spell) x 2  . CARDIAC CATHETERIZATION N/A 07/10/2015   Procedure: Left Heart Cath and Cors/Grafts Angiography;  Surgeon: [name], MD;  Location: [facility];  Service: Cardiovascular;  Laterality: N/A;  . COLONOSCOPY  06/2012   hyperplastic polyp, diverticulosis ([place]) rec rpt 5 yrs  . CORONARY ARTERY BYPASS GRAFT N/A 02/07/2014   Procedure: CORONARY ARTERY BYPASS GRAFTING (CABG);  Surgeon: [name], MD;  Location: [facility];  Service: Open Heart Surgery;  Laterality: N/A;  CABG X 3, BILATERAL LIMA, EVH  . CYSTOSCOPY  08/16/08   Bladder Cancer  . EPIDURAL BLOCK INJECTION Left 09/2014, 10/2014, 12/2014   medial L2,3,4, dorsal L5 ramus blocks x2, L L5/S1 and S1/2 transforaminal ESI ([place])  . ESI  04/2013, 06/2013   L L5S1, S12 transforaminal ESI (Dr.  [name])  . ESI Left 04/2014, 05/2014, 06/2014   L5/S1, S1/2; rpt; L4/5  . ESI  03/2016   R L5/S1 interlaminar ESI  . INTRAOPERATIVE TRANSESOPHAGEAL ECHOCARDIOGRAM N/A 02/07/2014   Procedure: INTRAOPERATIVE TRANSESOPHAGEAL ECHOCARDIOGRAM;  Surgeon: [name], MD;  Location: [facility];  Service: Open Heart Surgery;   Laterality: N/A;  . LEFT HEART CATHETERIZATION WITH CORONARY ANGIOGRAM N/A 02/01/2014   Procedure: LEFT HEART CATHETERIZATION WITH CORONARY ANGIOGRAM;  Surgeon: [name], MD;  Location: [facility];  Service: Cardiovascular;  Laterality: N/A;  . PRP epidural injection Left 11/2015   L5/S1, S1/2 transforaminal epidural PRP injections under fluoroscopy ([place])  . TONSILLECTOMY AND ADENOIDECTOMY  1973  . VASECTOMY     Social History   Occupational History  . Self Employed    Social History Main Topics  . Smoking status: Former Smoker    Packs/day: 1.00    Years: 40.00    Types: Cigarettes    Quit date: 02/05/2014  . Smokeless tobacco: Never Used  . Alcohol use 0.0 oz/week     Comment: Rare- states 1 oz. per week  . Drug use: No  . Sexual activity: Not on file

## 2016-08-12 ENCOUNTER — Encounter (INDEPENDENT_AMBULATORY_CARE_PROVIDER_SITE_OTHER): Payer: Self-pay | Admitting: Physical Medicine and Rehabilitation

## 2016-08-12 ENCOUNTER — Telehealth: Payer: Self-pay | Admitting: *Deleted

## 2016-08-12 ENCOUNTER — Other Ambulatory Visit: Payer: Self-pay | Admitting: Physician Assistant

## 2016-08-12 DIAGNOSIS — I25118 Atherosclerotic heart disease of native coronary artery with other forms of angina pectoris: Secondary | ICD-10-CM

## 2016-08-12 DIAGNOSIS — G4733 Obstructive sleep apnea (adult) (pediatric): Secondary | ICD-10-CM

## 2016-08-12 NOTE — Telephone Encounter (Signed)
-----   Message from Sueanne Margarita, MD sent at 08/11/2016 12:55 PM EST ----- Set CPAP at 17cm H2O and get a download in 2 weeks

## 2016-08-15 NOTE — Addendum Note (Signed)
Addended by: [name] on: 08/15/2016 12:15 PM   Modules accepted: Orders

## 2016-08-15 NOTE — Telephone Encounter (Addendum)
Actually I'm going to hold off on referral and agree with stopping thyroid meds, would offer repeat testing of thyroid function in [redacted] wks along with 8am testosterone - if abnormal, we can review treatment at that time. Is he ok with this plan?

## 2016-08-16 ENCOUNTER — Encounter (INDEPENDENT_AMBULATORY_CARE_PROVIDER_SITE_OTHER): Payer: Self-pay | Admitting: Physical Medicine and Rehabilitation

## 2016-08-16 ENCOUNTER — Ambulatory Visit (INDEPENDENT_AMBULATORY_CARE_PROVIDER_SITE_OTHER): Payer: Self-pay

## 2016-08-16 ENCOUNTER — Ambulatory Visit (INDEPENDENT_AMBULATORY_CARE_PROVIDER_SITE_OTHER): Payer: PPO | Admitting: Physical Medicine and Rehabilitation

## 2016-08-16 VITALS — BP 131/79 | HR 58

## 2016-08-16 DIAGNOSIS — M25552 Pain in left hip: Secondary | ICD-10-CM | POA: Diagnosis not present

## 2016-08-16 DIAGNOSIS — M1612 Unilateral primary osteoarthritis, left hip: Secondary | ICD-10-CM | POA: Insufficient documentation

## 2016-08-16 NOTE — Progress Notes (Signed)
Carl Lee - 61 y.o. male MRN [id number]  Date of birth: 17-Oct-1955  Office Visit Note: Visit Date: 08/16/2016 PCP: Carl Bush, MD Referred by: Carl Bush, MD  Subjective: Chief Complaint  Patient presents with  . Left Hip - Pain   HPI: Carl Lee is a 61 year old gentleman who is here today for planned left hip anesthetic arthrogram. No change in symptoms.    ROS Otherwise per HPI.  Assessment & Plan: Visit Diagnoses:  1. Pain in left hip     Plan: Findings:  Left hip anesthetic arthrogram. Patient did have relief during the anesthetic phase of the injection. He'll call us back in a week or so we'll have a planned next look at his neck or potentially radicular symptoms.    Meds & Orders: No orders of the defined types were placed in this encounter.   Orders Placed This Encounter  Procedures  . Large Joint Injection/Arthrocentesis  . XR C-ARM NO REPORT    Follow-up: Return if symptoms worsen or fail to improve, for Consider L5 Transforaminal epidural.   Procedures: Hip anesthetic arthrogram Date/Time: 08/16/2016 8:54 AM Performed by: Carl Lee Authorized by: Carl Lee   Consent Given by:  Patient Site marked: the procedure site was marked   Timeout: prior to procedure the correct patient, procedure, and site was verified   Indications:  Pain and diagnostic evaluation Location:  Hip Site:  L hip joint Prep: patient was prepped and draped in usual sterile fashion   Needle Size:  22 G (5.0 inches) Approach:  Anterior Ultrasound Guidance: No   Fluoroscopic Guidance: No   Arthrogram: Yes   Medications:  80 mg triamcinolone acetonide 40 MG/ML; 3 mL bupivacaine 0.5 % Aspiration Attempted: Yes   Patient tolerance:  Patient tolerated the procedure well with no immediate complications  Arthrogram demonstrated excellent flow of contrast throughout the joint surface without extravasation or obvious defect.  The patient had relief of symptoms  during the anesthetic phase of the injection.     No notes on file   Clinical History: 11/27/2015 FINDINGS: Severe susceptibility artifact resulting from the aorto bi-iliac stent graft which obscures portions of the lumbar spine suspect she on the STIR images.  Segmentation: Standard.  Alignment: Physiologic.  Vertebrae: Limited evaluation secondary to cystitis did of the artifact. No fracture, evidence of discitis, or bone lesion.  Conus medullaris: Extends to the L1 level and appears normal.  Paraspinal and other soft tissues: Severe susceptibility artifact resulting from the aorto bi-iliac stent graft .  Disc levels:  Disc spaces: Disc heights are relatively well maintained, but evaluation is limited.  T12-L1: No significant disc protrusion. No evidence of neural foraminal stenosis. No central canal stenosis.  L1-L2: Mild broad-based disc bulge. Mild bilateral facet arthropathy. No evidence of neural foraminal stenosis. No central canal stenosis.  L2-L3: Small central disc protrusion. Mild bilateral facet arthropathy. No evidence of neural foraminal stenosis. No central canal stenosis.  L3-L4: Moderate broad-based disc bulge flattening the ventral thecal sac. Mild bilateral facet arthropathy. Mild spinal stenosis. No foraminal stenosis.  L4-L5: Moderate broad-based disc bulge eccentric towards the right. Moderate bilateral facet arthropathy. Mild -moderate spinal stenosis. No evidence of neural foraminal stenosis.  L5-S1: Broad-based disc bulge with a left paracentral disc protrusion abutting the left intraspinal S1 nerve root. No evidence of neural foraminal stenosis. No central canal stenosis.  FINDINGS: The craniocervical junction appears unremarkable. No vertebral subluxation is observed. No significant abnormal spinal cord signal is observed. No significant  vertebral marrow edema is identified.  Intervertebral disc desiccation is observed  at all levels in the cervical spine.  Additional findings at individual levels are as follows:  C2-3:  Unremarkable.  C3-4: Mild bilateral foraminal stenosis and mild central narrowing of the thecal sac eccentric to the left due to left eccentric disc bulge, and facet arthropathy.  C4-5: Mild right foraminal stenosis due to uncinate and facet spurring.  C5-6: Moderate right and borderline left foraminal stenosis due to disc bulge, uncinate spurring, and facet spurring. The previous left paracentral disc protrusion at this level was less apparent.  C6-7: Mild bilateral foraminal stenosis and borderline central narrowing of the thecal sac due to disc bulge and facet arthropathy.  C7-T1: Mild bilateral foraminal stenosis due to disc bulge, and uncinate spurring.  He reports that he quit smoking about 2 years ago. His smoking use included Cigarettes. He has a 40.00 pack-year smoking history. He has never used smokeless tobacco.   Recent Labs  06/24/16 0843  HGBA1C 6.0    Objective:  VS:  HT:    WT:   BMI:     BP:131/79  HR:(!) 58bpm  TEMP: ( )  RESP:  Physical Exam  Musculoskeletal:  Painful internal range of motion of the left hip with good distal strength.    Ortho Exam Imaging: Xr C-arm No Report  Result Date: 08/16/2016 Please see Notes or Procedures tab for imaging impression.   Past Medical/Family/Surgical/Social History: Medications & Allergies reviewed per EMR Patient Active Problem List   Diagnosis Date Noted  . Pain in left hip 08/16/2016  . Atrial tachycardia, paroxysmal ([place]) 06/18/2016  . Chronic fatigue 04/27/2016  . CAD (coronary artery disease), native coronary artery   . Abdominal discomfort 06/24/2015  . Hepatic steatosis 06/12/2015  . Right trigeminal neuralgia 11/27/2014  . Chest pain 10/30/2014  . Vitamin B12 deficiency   . OSA (obstructive sleep apnea) 09/04/2014  . Headache 06/20/2014  . Prediabetes 06/08/2014  . Dysphagia  04/19/2014  . Zenker's diverticulum 04/19/2014  . COPD, moderate ([place]) 03/26/2014  . S/P CABG x 3 02/07/2014  . Occlusion and stenosis of vertebral artery without mention of cerebral infarction 11/12/2013  . Skin rash 04/17/2013  . Healthcare maintenance 01/09/2013  . Dyslipidemia 01/09/2013  . Lumbosacral radiculopathy at S1 09/14/2012  . Hx of migraines   . Arthritis   . History of repair of aneurysm of abdominal aorta using endovascular stent graft 05/01/2012  . Adenomatous polyps 03/24/2012  . Ex-smoker 03/24/2012  . Hypertension 03/24/2012  . Bladder cancer ([place]) 03/24/2012   Past Medical History:  Diagnosis Date  . AAA (abdominal aortic aneurysm) ([place]) 03/25/12   s/p endovascular repair  . Adenomatous polyp 06/2009  . Atrial tachycardia, paroxysmal ([place]) 06/18/2016  . Bladder cancer ([place]) 08/16/2008  . CAD (coronary artery disease), native coronary artery    S/p 3v CABG 01/2014   . Childhood asthma   . Contact dermatitis    atypical [name])  . COPD (chronic obstructive pulmonary disease) ([place])   . Coronary artery disease    a.2015: CABG w/ LIMA-LAD, RIMA-OM, SVG-RCA b. Cath on 07/10/15 w/ patent LIMA-LAD and RIMA-OM. SVG-RCA occluded but collaterals present  . Diverticulosis 2013   severe by CT and colonoscopy  . Environmental allergies    improved as ages  . Fracture of lateral malleolus of left ankle 08/29/2013  . Hepatic steatosis 06/2015   by [place]  . Hx of migraines   . Hypertension   . Lumbosacral radiculopathy at  S1 03/2012   left, with spinal stenosis (MRI 04/2013) improved with TF ESI L5/S1 and S1/2 ([facility] [place])  . OSA (obstructive sleep apnea) 09/04/2014   Severe with AHI 35/hr  . Prediabetes 06/08/2014   CAD   . Resistance to clopidogrel 2015   drug metabolism panel run - asked to scan  . Spinal stenosis    LS1 nerve root impingement from bulging disc  . Vitamin B12 deficiency   . Zenker diverticulum    Family History  Problem Relation Age of  Onset  . Diabetes Mother   . Arrhythmia Mother     pacemaker  . Cancer Mother     Bladder  . COPD Mother   . Thyroid disease Mother   . Hyperlipidemia Mother   . Hypertension Mother   . Diabetes Father   . CAD Father 59    CHF, MI  . Dementia Father   . Heart attack Father   . Diabetes Sister   . Diabetes Brother   . Hypertension Brother   . Heart attack Maternal Grandmother   . Colon cancer Neg Hx    Past Surgical History:  Procedure Laterality Date  . ABDOMINAL AORTIC ANEURYSM REPAIR  03/25/12   endovascular  . ABI  05/2013   WNL, L TBI low at 0.66  . Bladder cancer  March 2010 and Oct. 2015   [facility]) x 2  . CARDIAC CATHETERIZATION N/A 07/10/2015   Procedure: Left Heart Cath and Cors/Grafts Angiography;  Surgeon: [name], MD;  Location: [facility];  Service: Cardiovascular;  Laterality: N/A;  . COLONOSCOPY  06/2012   hyperplastic polyp, diverticulosis ([place]) rec rpt 5 yrs  . CORONARY ARTERY BYPASS GRAFT N/A 02/07/2014   Procedure: CORONARY ARTERY BYPASS GRAFTING (CABG);  Surgeon: [name], MD;  Location: [facility];  Service: Open Heart Surgery;  Laterality: N/A;  CABG X 3, BILATERAL LIMA, EVH  . CYSTOSCOPY  08/16/08   Bladder Cancer  . EPIDURAL BLOCK INJECTION Left 09/2014, 10/2014, 12/2014   medial L2,3,4, dorsal L5 ramus blocks x2, L L5/S1 and S1/2 transforaminal ESI ([facility] [place])  . ESI  04/2013, 06/2013   L L5S1, S12 transforaminal ESI (Dr.  [name])  . ESI Left 04/2014, 05/2014, 06/2014   L5/S1, S1/2; rpt; L4/5  . ESI  03/2016   R L5/S1 interlaminar ESI  . INTRAOPERATIVE TRANSESOPHAGEAL ECHOCARDIOGRAM N/A 02/07/2014   Procedure: INTRAOPERATIVE TRANSESOPHAGEAL ECHOCARDIOGRAM;  Surgeon: [name], MD;  Location: [facility];  Service: Open Heart Surgery;  Laterality: N/A;  . LEFT HEART CATHETERIZATION WITH CORONARY ANGIOGRAM N/A 02/01/2014   Procedure: LEFT HEART CATHETERIZATION WITH CORONARY ANGIOGRAM;  Surgeon: [name], MD;  Location: [facility];  Service: Cardiovascular;  Laterality: N/A;  . PRP epidural injection Left 11/2015   L5/S1, S1/2 transforaminal epidural PRP injections under fluoroscopy ([facility])  . TONSILLECTOMY AND ADENOIDECTOMY  1973  . VASECTOMY     Social History   Occupational History  . Self Employed    Social History Main Topics  . Smoking status: Former Smoker    Packs/day: 1.00    Years: 40.00    Types: Cigarettes    Quit date: 02/05/2014  . Smokeless tobacco: Never Used  . Alcohol use 0.0 oz/week     Comment: Rare- states 1 oz. per week  . Drug use: No  . Sexual activity: Not on file

## 2016-08-16 NOTE — Patient Instructions (Signed)

## 2016-08-17 MED ORDER — BUPIVACAINE HCL 0.5 % IJ SOLN
3.0000 mL | INTRAMUSCULAR | Status: AC | PRN
  Administered 2016-08-16: 3 mL via INTRA_ARTICULAR

## 2016-08-17 MED ORDER — TRIAMCINOLONE ACETONIDE 40 MG/ML IJ SUSP
80.0000 mg | INTRAMUSCULAR | Status: AC | PRN
  Administered 2016-08-16: 80 mg via INTRA_ARTICULAR

## 2016-08-17 NOTE — Telephone Encounter (Signed)
Patient notified and lab appt scheduled.  

## 2016-08-23 ENCOUNTER — Telehealth: Payer: Self-pay | Admitting: *Deleted

## 2016-08-23 NOTE — Telephone Encounter (Signed)
I called the patient and gave him his results and recommendations, he verbalized understanding but he complains of a extremely dry mouth and would like to know what you recommend for this problem. He is using adequate water, he tried lozenges, and pactine spray for dry mouth. Please advise

## 2016-08-23 NOTE — Telephone Encounter (Signed)
-----   Message from Sueanne Margarita, MD sent at 08/20/2016  9:01 PM EST ----- Good AHI and compliance.  Continue current CPAP settings.

## 2016-08-26 ENCOUNTER — Telehealth (INDEPENDENT_AMBULATORY_CARE_PROVIDER_SITE_OTHER): Payer: Self-pay | Admitting: Physical Medicine and Rehabilitation

## 2016-08-26 NOTE — Telephone Encounter (Signed)
I accidentally closed the patient call. He needs a left L5 transforaminal epidural steroid injection.

## 2016-08-26 NOTE — Telephone Encounter (Signed)
Sorry axial and closed the encounter and didn't figure out how to open them back up. He does as below need a left L5 transforaminal epidural steroid injection. He is going be somewhat difficult because of all the injections and thinks performed by Dr. [name] including PRP spine injections.

## 2016-08-27 NOTE — Telephone Encounter (Signed)
Faxed auth form with last 2 office notes to [employer]. Wrote on top of auth form "priority processing" to see if they might expedite request

## 2016-08-29 ENCOUNTER — Encounter: Payer: Self-pay | Admitting: Cardiology

## 2016-09-02 ENCOUNTER — Telehealth: Payer: Self-pay | Admitting: *Deleted

## 2016-09-02 NOTE — Telephone Encounter (Signed)
-----   Message from Sueanne Margarita, MD sent at 09/01/2016  2:39 PM EST ----- Good AHI and compliance.  Continue current CPAP settings.

## 2016-09-02 NOTE — Telephone Encounter (Signed)
Called the patient and gave him his  results, he verbalized understanding but wants to know what he can do about his mouth and nose getting so dry

## 2016-09-05 ENCOUNTER — Other Ambulatory Visit: Payer: Self-pay | Admitting: Family Medicine

## 2016-09-08 ENCOUNTER — Telehealth: Payer: Self-pay | Admitting: *Deleted

## 2016-09-08 NOTE — Telephone Encounter (Signed)
-----   Message from Sueanne Margarita, MD sent at 09/01/2016  2:39 PM EST ----- Good AHI and compliance.  Continue current CPAP settings.

## 2016-09-08 NOTE — Telephone Encounter (Signed)
Still pending per website 

## 2016-09-08 NOTE — Telephone Encounter (Signed)
Patient called today wants to know what he can do about his mouth and nose getting so dry. He can't sleep because the machine is leaking and keeping him awake. Had no problem before machine was turned up. Please advise

## 2016-09-09 ENCOUNTER — Encounter: Payer: Self-pay | Admitting: *Deleted

## 2016-09-09 ENCOUNTER — Telehealth: Payer: Self-pay | Admitting: Pulmonary Disease

## 2016-09-09 ENCOUNTER — Telehealth: Payer: Self-pay | Admitting: *Deleted

## 2016-09-09 DIAGNOSIS — G4733 Obstructive sleep apnea (adult) (pediatric): Secondary | ICD-10-CM

## 2016-09-09 NOTE — Telephone Encounter (Signed)
He really should be on another maintenance inhaler.  Stiolto may be a good option if his insurance covers it.

## 2016-09-09 NOTE — Telephone Encounter (Signed)
Dr. [name]  Please Advise-  Spoke with pt and he stated the cost of his Anoro he would not be able to afford. Offered to the pt to contact his insurance comapny to see if they can give him more affordable options but he wanted to know if he could just stop the Anoro all together or do you think he should get on another maintaince inhaler that he can afford.

## 2016-09-09 NOTE — Telephone Encounter (Signed)
-----   Message from Sueanne Margarita, MD sent at 09/09/2016  8:46 AM EST ----- Set up BiPap titration in sleep lab to see if we can get pressure lower

## 2016-09-09 NOTE — Telephone Encounter (Signed)
Spoke with pt, aware of recs. Pt will call insurance to see if inhaler is covered.  Will await call back.

## 2016-09-09 NOTE — Telephone Encounter (Signed)
Received auth. [id number]. eff 08/27/16-11/25/16. Called pt and scheduled him for 09/16/16 @ 8:30 w/driver and no BTS>

## 2016-09-09 NOTE — Telephone Encounter (Signed)
Bipap  titration ordered

## 2016-09-13 NOTE — Telephone Encounter (Signed)
lmtcb x1 for pt to check to see if he contacted his insurance company about [employer].

## 2016-09-14 NOTE — Telephone Encounter (Signed)
Spoke with patient, states that he called but did not have the name of the medication written down so he was unable to get any information from insurance. Pt was able to get a refill of Anoro covered and he is going to in the meantime work on trying to figure out what alternatives are covered. Pt may bring formulary by our office next week to be reviewed. Nothing further needed.

## 2016-09-16 ENCOUNTER — Telehealth: Payer: Self-pay | Admitting: Pulmonary Disease

## 2016-09-16 ENCOUNTER — Encounter (INDEPENDENT_AMBULATORY_CARE_PROVIDER_SITE_OTHER): Payer: Self-pay | Admitting: Physical Medicine and Rehabilitation

## 2016-09-16 ENCOUNTER — Ambulatory Visit (INDEPENDENT_AMBULATORY_CARE_PROVIDER_SITE_OTHER): Payer: PPO | Admitting: Physical Medicine and Rehabilitation

## 2016-09-16 ENCOUNTER — Ambulatory Visit (INDEPENDENT_AMBULATORY_CARE_PROVIDER_SITE_OTHER): Payer: Self-pay

## 2016-09-16 VITALS — BP 118/72 | HR 56 | Temp 97.9°F

## 2016-09-16 DIAGNOSIS — G4733 Obstructive sleep apnea (adult) (pediatric): Secondary | ICD-10-CM | POA: Diagnosis not present

## 2016-09-16 DIAGNOSIS — M5416 Radiculopathy, lumbar region: Secondary | ICD-10-CM

## 2016-09-16 MED ORDER — METHYLPREDNISOLONE ACETATE 80 MG/ML IJ SUSP
80.0000 mg | Freq: Once | INTRAMUSCULAR | Status: AC
  Administered 2016-09-16: 80 mg

## 2016-09-16 MED ORDER — LIDOCAINE HCL (PF) 1 % IJ SOLN
0.3300 mL | Freq: Once | INTRAMUSCULAR | Status: AC
  Administered 2016-09-16: 0.3 mL

## 2016-09-16 NOTE — Progress Notes (Signed)
KWAMAINE CUPPETT - 61 y.o. male MRN [id number]  Date of birth: 07/05/1956  Office Visit Note: Visit Date: 09/16/2016 PCP: [name], MD Referred by: [name], MD  Subjective: Chief Complaint  Patient presents with  . Lower Back - Pain  . Left Hip - Pain   HPI: Mr. Trupiano is a 61 year old gentleman who was a former patient of Dr. Neomia Dear who comes in today for planned left L5 transforaminal epidural steroid injection. Patient states he had around 90 % relief with the hip pain with the hip injection but did not help with the numbness in leg. Having numbness, tingling, and burning down left leg to foot. States Leg feels tired. He asked today about surgical referral depending on results of the injection. We spoke briefly about this. He also ask about his neck pain which we will see him in the future depending on when he wants to look at that. We have not really fully evaluated in his neck that we saw him on the initial evaluation.    ROS Otherwise per HPI.  Assessment & Plan: Visit Diagnoses:  1. Lumbar radiculopathy     Plan: Findings:  Left L5 transforaminal epidural steroid injection. Patient will return to see [place] for evaluation of his cervical spine at his discretion. He does have some axial low back pain that he says he can live with the were watching that. He has had spinal PRP in the past which is unfortunate procedure that were not performing at this point. We will see him back as needed.    Meds & Orders:  Meds ordered this encounter  Medications  . lidocaine (PF) (XYLOCAINE) 1 % injection 0.3 mL  . methylPREDNISolone acetate (DEPO-MEDROL) injection 80 mg    Orders Placed This Encounter  Procedures  . XR C-ARM NO REPORT  . Epidural Steroid injection    Follow-up: Return if symptoms worsen or fail to improve.   Procedures: No procedures performed  Lumbosacral Transforaminal Epidural Steroid Injection - Infraneural Approach with Fluoroscopic  Guidance  Patient: [name]      Date of Birth: 25-Sep-1955 MRN: [id number] PCP: [name], MD      Visit Date: 09/16/2016   Universal Protocol:    Date/Time: 03/08/188:45 AM  Consent Given By: the patient  Position: PRONE   Additional Comments: Vital signs were monitored before and after the procedure. Patient was prepped and draped in the usual sterile fashion. The correct patient, procedure, and site was verified.   Injection Procedure Details:  Procedure Site One Meds Administered:  Meds ordered this encounter  Medications  . lidocaine (PF) (XYLOCAINE) 1 % injection 0.3 mL  . methylPREDNISolone acetate (DEPO-MEDROL) injection 80 mg      Laterality: Left  Location/Site:  L5-S1  Needle size: 22 G  Needle type: Spinal  Needle Placement: Transforaminal  Findings:  -Contrast Used: 1 mL iohexol 180 mg iodine/mL   -Comments: Excellent flow of contrast along the nerve and into the epidural space.  Procedure Details: After squaring off the end-plates of the desired vertebral level to get a true AP view, the C-arm was obliqued to the painful side so that the superior articulating process is positioned about 1/3 the length of the inferior endplate.  The needle was aimed toward the junction of the superior articular process and the transverse process of the inferior vertebrae. The needle's initial entry is in the lower third of the foramen through Kambin's triangle. The soft tissues overlying this target were  infiltrated with 2-3 ml. of 1% Lidocaine without Epinephrine.  The spinal needle was then inserted and advanced toward the target using a "trajectory" view along the fluoroscope beam.  Under AP and lateral visualization, the needle was advanced so it did not puncture dura and did not traverse medially beyond the 6 o'clock position of the pedicle. Bi-planar projections were used to confirm position. Aspiration was confirmed to be negative for CSF and/or blood. A  1-2 ml. volume of Isovue-250 was injected and flow of contrast was noted at each level. Radiographs were obtained for documentation purposes.   After attaining the desired flow of contrast documented above, a 0.5 to 1.0 ml test dose of 0.25% Marcaine was injected into each respective transforaminal space.  The patient was observed for 90 seconds post injection.  After no sensory deficits were reported, and normal lower extremity motor function was noted,   the above injectate was administered so that equal amounts of the injectate were placed at each foramen (level) into the transforaminal epidural space.   Additional Comments:  The patient tolerated the procedure well Dressing: Band-Aid    Post-procedure details: Patient was observed during the procedure. Post-procedure instructions were reviewed.  Patient left the clinic in stable condition.   Clinical History: 11/27/2015 FINDINGS: Severe susceptibility artifact resulting from the aorto bi-iliac stent graft which obscures portions of the lumbar spine suspect she on the STIR images.  Segmentation: Standard.  Alignment: Physiologic.  Vertebrae: Limited evaluation secondary to cystitis did of the artifact. No fracture, evidence of discitis, or bone lesion.  Conus medullaris: Extends to the L1 level and appears normal.  Paraspinal and other soft tissues: Severe susceptibility artifact resulting from the aorto bi-iliac stent graft .  Disc levels:  Disc spaces: Disc heights are relatively well maintained, but evaluation is limited.  T12-L1: No significant disc protrusion. No evidence of neural foraminal stenosis. No central canal stenosis.  L1-L2: Mild broad-based disc bulge. Mild bilateral facet arthropathy. No evidence of neural foraminal stenosis. No central canal stenosis.  L2-L3: Small central disc protrusion. Mild bilateral facet arthropathy. No evidence of neural foraminal stenosis. No central canal  stenosis.  L3-L4: Moderate broad-based disc bulge flattening the ventral thecal sac. Mild bilateral facet arthropathy. Mild spinal stenosis. No foraminal stenosis.  L4-L5: Moderate broad-based disc bulge eccentric towards the right. Moderate bilateral facet arthropathy. Mild -moderate spinal stenosis. No evidence of neural foraminal stenosis.  L5-S1: Broad-based disc bulge with a left paracentral disc protrusion abutting the left intraspinal S1 nerve root. No evidence of neural foraminal stenosis. No central canal stenosis.  FINDINGS: The craniocervical junction appears unremarkable. No vertebral subluxation is observed. No significant abnormal spinal cord signal is observed. No significant vertebral marrow edema is identified.  Intervertebral disc desiccation is observed at all levels in the cervical spine.  Additional findings at individual levels are as follows:  C2-3:  Unremarkable.  C3-4: Mild bilateral foraminal stenosis and mild central narrowing of the thecal sac eccentric to the left due to left eccentric disc bulge, and facet arthropathy.  C4-5: Mild right foraminal stenosis due to uncinate and facet spurring.  C5-6: Moderate right and borderline left foraminal stenosis due to disc bulge, uncinate spurring, and facet spurring. The previous left paracentral disc protrusion at this level was less apparent.  C6-7: Mild bilateral foraminal stenosis and borderline central narrowing of the thecal sac due to disc bulge and facet arthropathy.  C7-T1: Mild bilateral foraminal stenosis due to disc bulge, and uncinate spurring.  He reports  that he quit smoking about 2 years ago. His smoking use included Cigarettes. He has a 40.00 pack-year smoking history. He has never used smokeless tobacco.   Recent Labs  06/24/16 0843  HGBA1C 6.0    Objective:  VS:  HT:    WT:   BMI:     BP:118/72  HR:(!) 56bpm  TEMP:97.9 F (36.6 C)(Oral)  RESP:99 % Physical  Exam  Musculoskeletal:  Patient ambulates without aid. He does have a with a forward flexed spine. He has good distal strength bilaterally in the lower extremities without clonus. No pain over the greater trochanter. Hip range of motion is much better than when he saw him prior to the    Ortho Exam Imaging: Xr C-arm No Report  Result Date: 09/16/2016 Please see Notes or Procedures tab for imaging impression.   Past Medical/Family/Surgical/Social History: Medications & Allergies reviewed per EMR Patient Active Problem List   Diagnosis Date Noted  . Pain in left hip 08/16/2016  . Atrial tachycardia, paroxysmal ([place]) 06/18/2016  . Chronic fatigue 04/27/2016  . CAD (coronary artery disease), native coronary artery   . Abdominal discomfort 06/24/2015  . Hepatic steatosis 06/12/2015  . Right trigeminal neuralgia 11/27/2014  . Chest pain 10/30/2014  . Vitamin B12 deficiency   . OSA (obstructive sleep apnea) 09/04/2014  . Headache 06/20/2014  . Prediabetes 06/08/2014  . Dysphagia 04/19/2014  . Zenker's diverticulum 04/19/2014  . COPD, moderate ([place]) 03/26/2014  . S/P CABG x 3 02/07/2014  . Occlusion and stenosis of vertebral artery without mention of cerebral infarction 11/12/2013  . Skin rash 04/17/2013  . Healthcare maintenance 01/09/2013  . Dyslipidemia 01/09/2013  . Lumbosacral radiculopathy at S1 09/14/2012  . Hx of migraines   . Arthritis   . History of repair of aneurysm of abdominal aorta using endovascular stent graft 05/01/2012  . Adenomatous polyps 03/24/2012  . Ex-smoker 03/24/2012  . Hypertension 03/24/2012  . Bladder cancer ([place]) 03/24/2012   Past Medical History:  Diagnosis Date  . AAA (abdominal aortic aneurysm) ([place]) 03/25/12   s/p endovascular repair  . Adenomatous polyp 06/2009  . Atrial tachycardia, paroxysmal ([place]) 06/18/2016  . Bladder cancer ([place]) 08/16/2008  . CAD (coronary artery disease), native coronary artery    S/p 3v CABG 01/2014   . Childhood  asthma   . Contact dermatitis    atypical [name])  . COPD (chronic obstructive pulmonary disease) ([place])   . Coronary artery disease    a.2015: CABG w/ LIMA-LAD, RIMA-OM, SVG-RCA b. Cath on 07/10/15 w/ patent LIMA-LAD and RIMA-OM. SVG-RCA occluded but collaterals present  . Diverticulosis 2013   severe by CT and colonoscopy  . Environmental allergies    improved as ages  . Fracture of lateral malleolus of left ankle 08/29/2013  . Hepatic steatosis 06/2015   by [place]  . Hx of migraines   . Hypertension   . Lumbosacral radiculopathy at S1 03/2012   left, with spinal stenosis (MRI 04/2013) improved with TF ESI L5/S1 and S1/2 ([facility] [place])  . OSA (obstructive sleep apnea) 09/04/2014   Severe with AHI 35/hr  . Prediabetes 06/08/2014   CAD   . Resistance to clopidogrel 2015   drug metabolism panel run - asked to scan  . Spinal stenosis    LS1 nerve root impingement from bulging disc  . Vitamin B12 deficiency   . Zenker diverticulum    Family History  Problem Relation Age of Onset  . Diabetes Mother   . Arrhythmia Mother  pacemaker  . Cancer Mother     Bladder  . COPD Mother   . Thyroid disease Mother   . Hyperlipidemia Mother   . Hypertension Mother   . Diabetes Father   . CAD Father 68    CHF, MI  . Dementia Father   . Heart attack Father   . Diabetes Sister   . Diabetes Brother   . Hypertension Brother   . Heart attack Maternal Grandmother   . Colon cancer Neg Hx    Past Surgical History:  Procedure Laterality Date  . ABDOMINAL AORTIC ANEURYSM REPAIR  03/25/12   endovascular  . ABI  05/2013   WNL, L TBI low at 0.66  . Bladder cancer  March 2010 and Oct. 2015   [facility] Spell) x 2  . CARDIAC CATHETERIZATION N/A 07/10/2015   Procedure: Left Heart Cath and Cors/Grafts Angiography;  Surgeon: [name], MD;  Location: [facility];  Service: Cardiovascular;  Laterality: N/A;  . COLONOSCOPY  06/2012   hyperplastic polyp, diverticulosis ([facility]) rec rpt  5 yrs  . CORONARY ARTERY BYPASS GRAFT N/A 02/07/2014   Procedure: CORONARY ARTERY BYPASS GRAFTING (CABG);  Surgeon: [name], MD;  Location: [facility];  Service: Open Heart Surgery;  Laterality: N/A;  CABG X 3, BILATERAL LIMA, EVH  . CYSTOSCOPY  08/16/08   Bladder Cancer  . EPIDURAL BLOCK INJECTION Left 09/2014, 10/2014, 12/2014   medial L2,3,4, dorsal L5 ramus blocks x2, L L5/S1 and S1/2 transforaminal ESI ([facility] [place])  . ESI  04/2013, 06/2013   L L5S1, S12 transforaminal ESI (Dr.  [name])  . ESI Left 04/2014, 05/2014, 06/2014   L5/S1, S1/2; rpt; L4/5  . ESI  03/2016   R L5/S1 interlaminar ESI  . INTRAOPERATIVE TRANSESOPHAGEAL ECHOCARDIOGRAM N/A 02/07/2014   Procedure: INTRAOPERATIVE TRANSESOPHAGEAL ECHOCARDIOGRAM;  Surgeon: [name], MD;  Location: [facility];  Service: Open Heart Surgery;  Laterality: N/A;  . LEFT HEART CATHETERIZATION WITH CORONARY ANGIOGRAM N/A 02/01/2014   Procedure: LEFT HEART CATHETERIZATION WITH CORONARY ANGIOGRAM;  Surgeon: [name], MD;  Location: [facility];  Service: Cardiovascular;  Laterality: N/A;  . PRP epidural injection Left 11/2015   L5/S1, S1/2 transforaminal epidural PRP injections under fluoroscopy ([place])  . TONSILLECTOMY AND ADENOIDECTOMY  1973  . VASECTOMY     Social History   Occupational History  . Self Employed    Social History Main Topics  . Smoking status: Former Smoker    Packs/day: 1.00    Years: 40.00    Types: Cigarettes    Quit date: 02/05/2014  . Smokeless tobacco: Never Used  . Alcohol use 0.0 oz/week     Comment: Rare- states 1 oz. per week  . Drug use: No  . Sexual activity: Not on file

## 2016-09-16 NOTE — Patient Instructions (Signed)

## 2016-09-16 NOTE — Telephone Encounter (Signed)
Pt dropped off formulary for BQ to look at for alternatives for his Stiolto.  September 14, 2016  Varney Daily, CMA 9:36 AM   Note  Spoke with patient, states that he called but did not have the name of the medication written down so he was unable to get any information from insurance. Pt was able to get a refill of Anoro covered and he is going to in the meantime work on trying to figure out what alternatives are covered. Pt may bring formulary by our office next week to be reviewed. Nothing further needed.     Please advise Dr [name]. Thanks.

## 2016-09-16 NOTE — Telephone Encounter (Signed)
OK to continue on Anoro until we see his formulary options, take one puff daily

## 2016-09-16 NOTE — Procedures (Signed)
Lumbosacral Transforaminal Epidural Steroid Injection - Infraneural Approach with Fluoroscopic Guidance  Patient: Carl Lee      Date of Birth: [birth year] MRN: [id number] PCP: [name], MD      Visit Date: 09/16/2016   Universal Protocol:    Date/Time: 03/08/188:45 AM  Consent Given By: the patient  Position: PRONE   Additional Comments: Vital signs were monitored before and after the procedure. Patient was prepped and draped in the usual sterile fashion. The correct patient, procedure, and site was verified.   Injection Procedure Details:  Procedure Site One Meds Administered:  Meds ordered this encounter  Medications  . lidocaine (PF) (XYLOCAINE) 1 % injection 0.3 mL  . methylPREDNISolone acetate (DEPO-MEDROL) injection 80 mg      Laterality: Left  Location/Site:  L5-S1  Needle size: 22 G  Needle type: Spinal  Needle Placement: Transforaminal  Findings:  -Contrast Used: 1 mL iohexol 180 mg iodine/mL   -Comments: Excellent flow of contrast along the nerve and into the epidural space.  Procedure Details: After squaring off the end-plates of the desired vertebral level to get a true AP view, the C-arm was obliqued to the painful side so that the superior articulating process is positioned about 1/3 the length of the inferior endplate.  The needle was aimed toward the junction of the superior articular process and the transverse process of the inferior vertebrae. The needle's initial entry is in the lower third of the foramen through Kambin's triangle. The soft tissues overlying this target were infiltrated with 2-3 ml. of 1% Lidocaine without Epinephrine.  The spinal needle was then inserted and advanced toward the target using a "trajectory" view along the fluoroscope beam.  Under AP and lateral visualization, the needle was advanced so it did not puncture dura and did not traverse medially beyond the 6 o'clock position of the pedicle. Bi-planar projections  were used to confirm position. Aspiration was confirmed to be negative for CSF and/or blood. A 1-2 ml. volume of Isovue-250 was injected and flow of contrast was noted at each level. Radiographs were obtained for documentation purposes.   After attaining the desired flow of contrast documented above, a 0.5 to 1.0 ml test dose of 0.25% Marcaine was injected into each respective transforaminal space.  The patient was observed for 90 seconds post injection.  After no sensory deficits were reported, and normal lower extremity motor function was noted,   the above injectate was administered so that equal amounts of the injectate were placed at each foramen (level) into the transforaminal epidural space.   Additional Comments:  The patient tolerated the procedure well Dressing: Band-Aid    Post-procedure details: Patient was observed during the procedure. Post-procedure instructions were reviewed.  Patient left the clinic in stable condition.

## 2016-09-17 NOTE — Telephone Encounter (Signed)
Spoke with pt. He is aware of BQ's recommendation. Will await BQ's decision about alternative to Anoro.

## 2016-09-20 ENCOUNTER — Other Ambulatory Visit: Payer: BLUE CROSS/BLUE SHIELD

## 2016-09-20 NOTE — Telephone Encounter (Signed)
A copy of the pt's formulary is located in BQ's look at [place]. There are several covered alteratives. Will have BQ look at formulary to make his decision.

## 2016-09-20 NOTE — Telephone Encounter (Signed)
Did he provide [place] with names of alternatives from his formulary?

## 2016-09-21 ENCOUNTER — Other Ambulatory Visit: Payer: BLUE CROSS/BLUE SHIELD

## 2016-09-21 NOTE — Telephone Encounter (Signed)
thanks

## 2016-09-23 ENCOUNTER — Other Ambulatory Visit (INDEPENDENT_AMBULATORY_CARE_PROVIDER_SITE_OTHER): Payer: PPO

## 2016-09-23 DIAGNOSIS — E538 Deficiency of other specified B group vitamins: Secondary | ICD-10-CM

## 2016-09-23 DIAGNOSIS — R5382 Chronic fatigue, unspecified: Secondary | ICD-10-CM | POA: Diagnosis not present

## 2016-09-23 LAB — VITAMIN B12: VITAMIN B 12: 441 pg/mL (ref 211–911)

## 2016-09-23 LAB — T4, FREE: FREE T4: 0.9 ng/dL (ref 0.60–1.60)

## 2016-09-23 LAB — TSH: TSH: 2.76 u[IU]/mL (ref 0.35–4.50)

## 2016-09-24 LAB — T3: T3 TOTAL: 98 ng/dL (ref 76–181)

## 2016-09-27 LAB — TESTOS,TOTAL,FREE AND SHBG (FEMALE)
Sex Hormone Binding Glob.: 38 nmol/L (ref 22–77)
TESTOSTERONE,FREE: 28 pg/mL — AB (ref 35.0–155.0)
Testosterone,Total,LC/MS/MS: 259 ng/dL (ref 250–1100)

## 2016-09-29 NOTE — Telephone Encounter (Signed)
BQ please advise if any change in med you would like to make from the formulary.  thanks

## 2016-09-29 NOTE — Telephone Encounter (Signed)
Can someone please forward me the names of the covered respiratory medications?

## 2016-09-30 NOTE — Telephone Encounter (Signed)
Per formulary that patient brought in, it appears that [place], breo, symbicort, and serevent are on pt's formulary.  These are all Tier 3 medications.  There are no tier 2 inhalers on formulary.   Full formulary is in BQ's look-at folder.  BQ please advise. Thanks!

## 2016-09-30 NOTE — Telephone Encounter (Signed)
Breo 100 daily 

## 2016-10-01 MED ORDER — FLUTICASONE FUROATE-VILANTEROL 100-25 MCG/INH IN AEPB: 1.0000 | INHALATION_SPRAY | Freq: Every day | RESPIRATORY_TRACT | 6 refills | Status: DC

## 2016-10-01 NOTE — Telephone Encounter (Signed)
Called and spoke with pt and he is aware of breo that has been sent to his pharmacy. Nothing further is needed.

## 2016-10-05 ENCOUNTER — Telehealth (INDEPENDENT_AMBULATORY_CARE_PROVIDER_SITE_OTHER): Payer: Self-pay

## 2016-10-05 NOTE — Telephone Encounter (Signed)
Patient left vm stating injection he had on 09/16/16 (left L5-S1 TF) helped with back pain but not with lt hip and leg pain. Wants to know what else you recommend?

## 2016-10-05 NOTE — Telephone Encounter (Signed)
Scheduled pt for 10/19/16 @ 10:30

## 2016-10-05 NOTE — Telephone Encounter (Signed)
Really need OV to discuss all options

## 2016-10-11 ENCOUNTER — Encounter: Payer: Self-pay | Admitting: Cardiovascular Disease

## 2016-10-18 ENCOUNTER — Encounter (HOSPITAL_BASED_OUTPATIENT_CLINIC_OR_DEPARTMENT_OTHER): Payer: BLUE CROSS/BLUE SHIELD

## 2016-10-18 ENCOUNTER — Telehealth: Payer: Self-pay | Admitting: *Deleted

## 2016-10-18 NOTE — Telephone Encounter (Signed)
Patient called today wanting an earlier appointment for his sleep study. I called the sleep lab and they were able to give him a cancellation appointment for 10/24/16. He agreed to the new appointment

## 2016-10-19 ENCOUNTER — Encounter (INDEPENDENT_AMBULATORY_CARE_PROVIDER_SITE_OTHER): Payer: Self-pay | Admitting: Physical Medicine and Rehabilitation

## 2016-10-19 ENCOUNTER — Ambulatory Visit (INDEPENDENT_AMBULATORY_CARE_PROVIDER_SITE_OTHER): Payer: PPO | Admitting: Physical Medicine and Rehabilitation

## 2016-10-19 ENCOUNTER — Telehealth (INDEPENDENT_AMBULATORY_CARE_PROVIDER_SITE_OTHER): Payer: Self-pay | Admitting: Physical Medicine and Rehabilitation

## 2016-10-19 VITALS — BP 146/85 | HR 56

## 2016-10-19 DIAGNOSIS — M5116 Intervertebral disc disorders with radiculopathy, lumbar region: Secondary | ICD-10-CM | POA: Diagnosis not present

## 2016-10-19 DIAGNOSIS — M609 Myositis, unspecified: Secondary | ICD-10-CM

## 2016-10-19 DIAGNOSIS — M25552 Pain in left hip: Secondary | ICD-10-CM | POA: Diagnosis not present

## 2016-10-19 DIAGNOSIS — M542 Cervicalgia: Secondary | ICD-10-CM | POA: Diagnosis not present

## 2016-10-19 DIAGNOSIS — M47812 Spondylosis without myelopathy or radiculopathy, cervical region: Secondary | ICD-10-CM

## 2016-10-19 DIAGNOSIS — M5416 Radiculopathy, lumbar region: Secondary | ICD-10-CM

## 2016-10-19 NOTE — Progress Notes (Signed)
Carl Lee - 61 y.o. male MRN [id number]  Date of birth: 10-Jul-1956  Office Visit Note: Visit Date: 10/19/2016 PCP: Carl Bush, MD Referred by: Carl Bush, MD  Subjective: Chief Complaint  Patient presents with  . Lower Back - Pain  . Left Hip - Pain   HPI: Carl Lee is a 61 year old gentleman with somewhat diagnostically difficult left hip and leg pain. His brief history is that he was seen by Dr. [name] and completed several injections as well as PRP treatment. Because of insurance difficulties with her he did see me as a consultation. The day that we saw him he was having groin pain that was worse with internal rotation and worse with weightbearing to a degree. This was on his left hip. He also had pain down the leg in a classic L5 or S1 distribution to the ankle. He does get paresthesias. We completed an intra-articular hip injection with fluoroscopic guidance. My notes indicate that he got quite a bit relief from the groin pain and was able to move better and was quite happy with that but he was still getting this radicular-type complaint. After that we completed a left L5 transforaminal epidural steroid injection with fluoroscopic guidance diagnostically. Unfortunately the anatomy of his lumbar spine as such of the L5 vertebral bodies is very low in the pelvis with very high iliac crest and he is a challenge to get into the foramen. We did look at the fluoroscopic images it does look like it was well-placed. MRI evidence does show disc herniation at L5-S1 to the left paracentral region may be affecting the S1 nerve root. He has multiple issues in the lumbar spine but that seems to be the one it's related to his symptoms. Transforaminal injection did seem to give him good relief in the short-term. He said he got relief which was quite significant but then the symptoms returned. Again he describes, eft buttock/ hip pain with numbness down to foot. Left foot is numb. The pain  gradually decreases through the day, but numbness returns later in the day. Says hip is hurting so bad that he can't tell about his back. Says back is ok if he sits or rests. When he refers to as hip pain really is pointing to the posterior lateral region. Lower back still hurts with activity. Says right hip/buttock is starting to bother him from time to time. He states that he feels like the actual hip injection seemed to help the referral pain as well but that is something that we did not document with him the first time.  Secondarily he does have a lot of neck pain. We did review the MRI of his cervical spine today. He states that really is a secondary issue at this point compared to the hip pain. By brief review he has more neck pain which is mostly around the trapezius and C7 spinous process. There is nothing really down the arms or into the hands. No paresthesias. She's had no weakness in the hands. He has had potentially a prior epidural injection by Dr. [name]. Cervical MRI is reviewed below.     Review of Systems  Constitutional: Negative for chills, fever, malaise/fatigue and weight loss.  HENT: Negative for hearing loss and sinus pain.   Eyes: Negative for blurred vision, double vision and photophobia.  Respiratory: Negative for cough and shortness of breath.   Cardiovascular: Negative for chest pain, palpitations and leg swelling.  Gastrointestinal: Negative for abdominal pain, nausea and vomiting.  Genitourinary: Negative for flank pain.  Musculoskeletal: Positive for back pain, joint pain and neck pain. Negative for myalgias.  Skin: Negative for itching and rash.  Neurological: Positive for tingling. Negative for tremors, focal weakness and weakness.  Endo/Heme/Allergies: Negative.   Psychiatric/Behavioral: Negative for depression.  All other systems reviewed and are negative.  Otherwise per HPI.  Assessment & Plan: Visit Diagnoses: No diagnosis found.  Plan: Findings:    Diagnostically challenging left hip pain which seems to be more radicular L5 or S1 nerve root pain likely from small posterior lateral disc herniation at L5-S1. He clearly has some osteoarthritis of the left hip as well and diagnostic injection did help his groin pain which has not returned. Walks quite a bit today about his pain symptoms and I still think this is radicular with the numbness and tingling. I want to try diagnostic S1 transforaminal injection. The reasoning behind this is the more medication will likely get to the area but it will be easier to do than the L5 injection due to the patient's anatomy and low seated L5 vertebral body. If he does well with the injection at last a while that will know for sure this is radicular or we can look at potential treatment such as microdiscectomy or medication management or spinal cord stimulator. If it doesn't seem to help very much we would look at this potentially is more of a hip arthritis issue although I think that secondary. In terms of his neck pain I think he would be a good candidate for cervical epidural injection. We'll review the notes from Dr. [name]. He does have some narrowing and arthritic changes and I do think is probably causing some of his neck pain. Conversely we did talk about activity modification with have been resting his neck at times when he is reading. He does like to repeat quite a bit and sits with a forward flexed spine a lot of the day and I think is we get older this tends to happen most people. We counseled him on doing this during the day to see if that would give him some relief. We also talked about isometric strengthening of the cervical spine muscles. No medication changes. We'll see him back for the S1 transforaminal epidural steroid injection on the left. I spent more than 25 minutes speaking face-to-face with the patient with 50% of the time in counseling.    Meds & Orders: No orders of the defined types were  placed in this encounter.  No orders of the defined types were placed in this encounter.   Follow-up: Return for Left S1 transforaminal epidural steroid injection..   Procedures: No procedures performed  No notes on file   Clinical History: 11/27/2015 FINDINGS: Severe susceptibility artifact resulting from the aorto bi-iliac stent graft which obscures portions of the lumbar spine suspect she on the STIR images.  Segmentation: Standard.  Alignment: Physiologic.  Vertebrae: Limited evaluation secondary to cystitis did of the artifact. No fracture, evidence of discitis, or bone lesion.  Conus medullaris: Extends to the L1 level and appears normal.  Paraspinal and other soft tissues: Severe susceptibility artifact resulting from the aorto bi-iliac stent graft .  Disc levels:  Disc spaces: Disc heights are relatively well maintained, but evaluation is limited.  T12-L1: No significant disc protrusion. No evidence of neural foraminal stenosis. No central canal stenosis.  L1-L2: Mild broad-based disc bulge. Mild bilateral facet arthropathy. No evidence of neural foraminal stenosis. No central canal stenosis.  L2-L3: Small  central disc protrusion. Mild bilateral facet arthropathy. No evidence of neural foraminal stenosis. No central canal stenosis.  L3-L4: Moderate broad-based disc bulge flattening the ventral thecal sac. Mild bilateral facet arthropathy. Mild spinal stenosis. No foraminal stenosis.  L4-L5: Moderate broad-based disc bulge eccentric towards the right. Moderate bilateral facet arthropathy. Mild -moderate spinal stenosis. No evidence of neural foraminal stenosis.  L5-S1: Broad-based disc bulge with a left paracentral disc protrusion abutting the left intraspinal S1 nerve root. No evidence of neural foraminal stenosis. No central canal stenosis.  FINDINGS: The craniocervical junction appears unremarkable. No vertebral subluxation is observed.  No significant abnormal spinal cord signal is observed. No significant vertebral marrow edema is identified.  Intervertebral disc desiccation is observed at all levels in the cervical spine.  Additional findings at individual levels are as follows:  C2-3:  Unremarkable.  C3-4: Mild bilateral foraminal stenosis and mild central narrowing of the thecal sac eccentric to the left due to left eccentric disc bulge, and facet arthropathy.  C4-5: Mild right foraminal stenosis due to uncinate and facet spurring.  C5-6: Moderate right and borderline left foraminal stenosis due to disc bulge, uncinate spurring, and facet spurring. The previous left paracentral disc protrusion at this level was less apparent.  C6-7: Mild bilateral foraminal stenosis and borderline central narrowing of the thecal sac due to disc bulge and facet arthropathy.  C7-T1: Mild bilateral foraminal stenosis due to disc bulge, and uncinate spurring.  He reports that he quit smoking about 2 years ago. His smoking use included Cigarettes. He has a 40.00 pack-year smoking history. He has never used smokeless tobacco.   Recent Labs  06/24/16 0843  HGBA1C 6.0    Objective:  VS:  HT:    WT:   BMI:     BP:(!) 146/85  HR:(!) 56bpm  TEMP: ( )  RESP:  Physical Exam  Constitutional: He is oriented to person, place, and time. He appears well-developed and well-nourished. No distress.  HENT:  Head: Normocephalic and atraumatic.  Nose: Nose normal.  Mouth/Throat: Oropharynx is clear and moist.  Eyes: Conjunctivae are normal. Pupils are equal, round, and reactive to light.  Neck: Normal range of motion. Neck supple. No thyromegaly present.  Cardiovascular: Regular rhythm and intact distal pulses.   Pulmonary/Chest: Effort normal and breath sounds normal.  Abdominal: Soft. He exhibits no distension.  Musculoskeletal: He exhibits no deformity.  Cervical range of motion is limited in ranges of rotation and  extension he sits with a forward flexed spine. He has more pain around the spinous process and into the trapezius muscles and levator scapula. Some mild impingement signs of both shoulders. He has good upper body strength and symmetric bilaterally without any deficits. He has a negative Hoffmann's test bilaterally. Examination of his lower body shows only mild pain with internal rotation of the left hip. He does get some pain with external rotation of the hip. This is laterally. He has mild tenderness over the greater trochanter on the left. He has a positive slump test on the left. He has good distal strength without clonus.  Lymphadenopathy:    He has no cervical adenopathy.  Neurological: He is alert and oriented to person, place, and time.  Skin: Skin is warm. No rash noted.  Psychiatric: He has a normal mood and affect. His behavior is normal.  Nursing note and vitals reviewed.   Ortho Exam Imaging: No results found.  Past Medical/Family/Surgical/Social History: Medications & Allergies reviewed per EMR Patient Active Problem List  Diagnosis Date Noted  . Pain in left hip 08/16/2016  . Atrial tachycardia, paroxysmal ([place]) 06/18/2016  . Chronic fatigue 04/27/2016  . CAD (coronary artery disease), native coronary artery   . Abdominal discomfort 06/24/2015  . Hepatic steatosis 06/12/2015  . Right trigeminal neuralgia 11/27/2014  . Chest pain 10/30/2014  . Vitamin B12 deficiency   . OSA (obstructive sleep apnea) 09/04/2014  . Headache 06/20/2014  . Prediabetes 06/08/2014  . Dysphagia 04/19/2014  . Zenker's diverticulum 04/19/2014  . COPD, moderate ([place]) 03/26/2014  . S/P CABG x 3 02/07/2014  . Occlusion and stenosis of vertebral artery without mention of cerebral infarction 11/12/2013  . Skin rash 04/17/2013  . Healthcare maintenance 01/09/2013  . Dyslipidemia 01/09/2013  . Lumbosacral radiculopathy at S1 09/14/2012  . Hx of migraines   . Arthritis   . History of repair of  aneurysm of abdominal aorta using endovascular stent graft 05/01/2012  . Adenomatous polyps 03/24/2012  . Ex-smoker 03/24/2012  . Hypertension 03/24/2012  . Bladder cancer ([place]) 03/24/2012   Past Medical History:  Diagnosis Date  . AAA (abdominal aortic aneurysm) ([place]) 03/25/12   s/p endovascular repair  . Adenomatous polyp 06/2009  . Atrial tachycardia, paroxysmal ([place]) 06/18/2016  . Bladder cancer ([place]) 08/16/2008  . CAD (coronary artery disease), native coronary artery    S/p 3v CABG 01/2014   . Childhood asthma   . Contact dermatitis    atypical [name])  . COPD (chronic obstructive pulmonary disease) ([place])   . Coronary artery disease    a.2015: CABG w/ LIMA-LAD, RIMA-OM, SVG-RCA b. Cath on 07/10/15 w/ patent LIMA-LAD and RIMA-OM. SVG-RCA occluded but collaterals present  . Diverticulosis 2013   severe by CT and colonoscopy  . Environmental allergies    improved as ages  . Fracture of lateral malleolus of left ankle 08/29/2013  . Hepatic steatosis 06/2015   by [place]  . Hx of migraines   . Hypertension   . Lumbosacral radiculopathy at S1 03/2012   left, with spinal stenosis (MRI 04/2013) improved with TF ESI L5/S1 and S1/2 ([place])  . OSA (obstructive sleep apnea) 09/04/2014   Severe with AHI 35/hr  . Prediabetes 06/08/2014   CAD   . Resistance to clopidogrel 2015   drug metabolism panel run - asked to scan  . Spinal stenosis    LS1 nerve root impingement from bulging disc  . Vitamin B12 deficiency   . Zenker diverticulum    Family History  Problem Relation Age of Onset  . Diabetes Mother   . Arrhythmia Mother     pacemaker  . Cancer Mother     Bladder  . COPD Mother   . Thyroid disease Mother   . Hyperlipidemia Mother   . Hypertension Mother   . Diabetes Father   . CAD Father 69    CHF, MI  . Dementia Father   . Heart attack Father   . Diabetes Sister   . Diabetes Brother   . Hypertension Brother   . Heart attack Maternal Grandmother   . Colon  cancer Neg Hx    Past Surgical History:  Procedure Laterality Date  . ABDOMINAL AORTIC ANEURYSM REPAIR  03/25/12   endovascular  . ABI  05/2013   WNL, L TBI low at 0.66  . Bladder cancer  March 2010 and Oct. 2015   [facility] Spell) x 2  . CARDIAC CATHETERIZATION N/A 07/10/2015   Procedure: Left Heart Cath and Cors/Grafts Angiography;  Surgeon: [name], MD;  Location:  [facility];  Service: Cardiovascular;  Laterality: N/A;  . COLONOSCOPY  06/2012   hyperplastic polyp, diverticulosis ([place]) rec rpt 5 yrs  . CORONARY ARTERY BYPASS GRAFT N/A 02/07/2014   Procedure: CORONARY ARTERY BYPASS GRAFTING (CABG);  Surgeon: [name], MD;  Location: [facility];  Service: Open Heart Surgery;  Laterality: N/A;  CABG X 3, BILATERAL LIMA, EVH  . CYSTOSCOPY  08/16/08   Bladder Cancer  . EPIDURAL BLOCK INJECTION Left 09/2014, 10/2014, 12/2014   medial L2,3,4, dorsal L5 ramus blocks x2, L L5/S1 and S1/2 transforaminal ESI ([place])  . ESI  04/2013, 06/2013   L L5S1, S12 transforaminal ESI (Dr.  [name])  . ESI Left 04/2014, 05/2014, 06/2014   L5/S1, S1/2; rpt; L4/5  . ESI  03/2016   R L5/S1 interlaminar ESI  . INTRAOPERATIVE TRANSESOPHAGEAL ECHOCARDIOGRAM N/A 02/07/2014   Procedure: INTRAOPERATIVE TRANSESOPHAGEAL ECHOCARDIOGRAM;  Surgeon: [name], MD;  Location: [facility];  Service: Open Heart Surgery;  Laterality: N/A;  . LEFT HEART CATHETERIZATION WITH CORONARY ANGIOGRAM N/A 02/01/2014   Procedure: LEFT HEART CATHETERIZATION WITH CORONARY ANGIOGRAM;  Surgeon: [name], MD;  Location: [facility];  Service: Cardiovascular;  Laterality: N/A;  . PRP epidural injection Left 11/2015   L5/S1, S1/2 transforaminal epidural PRP injections under fluoroscopy ([place])  . TONSILLECTOMY AND ADENOIDECTOMY  1973  . VASECTOMY     Social History   Occupational History  . Self Employed    Social History Main Topics  . Smoking status: Former Smoker     Packs/day: 1.00    Years: 40.00    Types: Cigarettes    Quit date: 02/05/2014  . Smokeless tobacco: Never Used  . Alcohol use 0.0 oz/week     Comment: Rare- states 1 oz. per week  . Drug use: No  . Sexual activity: Not on file

## 2016-10-21 DIAGNOSIS — M5116 Intervertebral disc disorders with radiculopathy, lumbar region: Secondary | ICD-10-CM | POA: Insufficient documentation

## 2016-10-22 NOTE — Telephone Encounter (Signed)
Faxed auth form and notes to [facility]

## 2016-10-24 ENCOUNTER — Ambulatory Visit (HOSPITAL_BASED_OUTPATIENT_CLINIC_OR_DEPARTMENT_OTHER): Payer: PPO | Attending: Cardiology | Admitting: Cardiology

## 2016-10-24 DIAGNOSIS — G4733 Obstructive sleep apnea (adult) (pediatric): Secondary | ICD-10-CM | POA: Diagnosis not present

## 2016-10-26 ENCOUNTER — Encounter: Payer: Self-pay | Admitting: Cardiovascular Disease

## 2016-11-01 NOTE — Procedures (Signed)
   Patient Name: Carl Lee, Carl Lee Date: 10/24/2016 Gender: Male D.O.B: 01-21-1956 Age (years): 89 Referring Provider: Fransico Him MD, ABSM Height (inches): 70 Interpreting Physician: Fransico Him MD, ABSM Weight (lbs): 205 RPSGT: Joni Reining BMI: 29 MRN: [id number] Neck Size: 16.50  CLINICAL INFORMATION The patient is referred for a BiPAP titration to treat sleep apnea.    SLEEP STUDY TECHNIQUE As per the AASM Manual for the Scoring of Sleep and Associated Events v2.3 (April 2016) with a hypopnea requiring 4% desaturations.  The channels recorded and monitored were frontal, central and occipital EEG, electrooculogram (EOG), submentalis EMG (chin), nasal and oral airflow, thoracic and abdominal wall motion, anterior tibialis EMG, snore microphone, electrocardiogram, and pulse oximetry. Bilevel positive airway pressure (BPAP) was initiated at the beginning of the study and titrated to treat sleep-disordered breathing.  MEDICATIONS Medications self-administered by patient taken the night of the study : N/A  RESPIRATORY PARAMETERS Optimal IPAP Pressure (cm): 16  AHI at Optimal Pressure (/hr) 0 Optimal EPAP Pressure (cm):12     Overall Minimal O2 (%):87.00  Minimal O2 at Optimal Pressure (%): 90.0  SLEEP ARCHITECTURE Start Time:10:51:04 PM  Stop Time:5:00:23 AM  Total Time (min):369.3  Total Sleep Time (min):233.7 Sleep Latency (min):30.7  Sleep Efficiency (%): 63.3  REM Latency (min):151.0  WASO (min):105.0 Stage N1 (%): 10.91  Stage N2 (%): 64.69  Stage N3 (%):0.00  Stage R (%):24.39 Supine (%): 22.11  Arousal Index (/hr): 27.2      CARDIAC DATA The 2 lead EKG demonstrated sinus rhythm. The mean heart rate was 64.67 beats per minute. Other EKG findings include: PVCs.  LEG MOVEMENT DATA The total Periodic Limb Movements of Sleep (PLMS) were 474. The PLMS index was 121.72. A PLMS index of <15 is considered normal in adults.  IMPRESSIONS - An optimal PAP  pressure was selected for this patient ( 16/12 cm of water) - Central sleep apnea was not noted during this titration (CAI = 0.8/h). - Mild oxygen desaturations were observed during this titration (min O2 = 87.00%). - The patient snored with Loud snoring volume. - No cardiac abnormalities were observed during this study. - Severe periodic limb movements were observed during this study. Arousals associated with PLMs were significant.  DIAGNOSIS - Obstructive Sleep Apnea (327.23 [G47.33 ICD-10])  RECOMMENDATIONS - Trial of BiPAP therapy on 16/12 cm H2O with a Medium size Fisher&Paykel Full Face Mask Simplus mask and heated humidification. - Avoid alcohol, sedatives and other CNS depressants that may worsen sleep apnea and disrupt normal sleep architecture. - Sleep hygiene should be reviewed to assess factors that may improve sleep quality. - Weight management and regular exercise should be initiated or continued. - Return to Sleep Center for re-evaluation after 10 weeks of therapy  [place], [place] [facility]  ELECTRONICALLY SIGNED ON:  11/01/2016, 8:33 AM [place] PH: [phone]   FX: [phone] Portal

## 2016-11-02 ENCOUNTER — Telehealth: Payer: Self-pay | Admitting: *Deleted

## 2016-11-02 NOTE — Telephone Encounter (Signed)
-----   Message from Sueanne Margarita, MD sent at 11/01/2016  8:36 AM EDT ----- Pt had successful PAP titration. Please setup appointment in 10 weeks. Please let [facility] know that order for PAP is in EPIC.

## 2016-11-02 NOTE — Telephone Encounter (Signed)
Informed patient of his PAP titration results and he verbalized understanding. He understands Dr. Radford Pax has placed an order for a new Bipap at 16/12cm H20 in epic. He understands he will be contacted by [facility] to set up his Bipap. He understands to call if [facility] does not contact him with new set up in a timely manner. Patient understands he will be called once confirmation has been received from [facility] that he has received his new machine to schedule a 10 week follow up appointment. Message sent to [facility] to set up patient with new device.

## 2016-11-03 NOTE — Telephone Encounter (Signed)
Authorization received. Called pt and scheduled him for 11/11/16.

## 2016-11-11 ENCOUNTER — Ambulatory Visit: Payer: BLUE CROSS/BLUE SHIELD | Admitting: Cardiovascular Disease

## 2016-11-11 ENCOUNTER — Encounter (INDEPENDENT_AMBULATORY_CARE_PROVIDER_SITE_OTHER): Payer: Self-pay | Admitting: Physical Medicine and Rehabilitation

## 2016-11-11 ENCOUNTER — Ambulatory Visit (INDEPENDENT_AMBULATORY_CARE_PROVIDER_SITE_OTHER): Payer: PPO

## 2016-11-11 ENCOUNTER — Ambulatory Visit (INDEPENDENT_AMBULATORY_CARE_PROVIDER_SITE_OTHER): Payer: PPO | Admitting: Physical Medicine and Rehabilitation

## 2016-11-11 VITALS — BP 123/74 | HR 61

## 2016-11-11 DIAGNOSIS — M5416 Radiculopathy, lumbar region: Secondary | ICD-10-CM

## 2016-11-11 MED ORDER — METHYLPREDNISOLONE ACETATE 80 MG/ML IJ SUSP
80.0000 mg | Freq: Once | INTRAMUSCULAR | Status: AC
  Administered 2016-11-11: 80 mg

## 2016-11-11 MED ORDER — IIOPAMIDOL (ISOVUE-250) INJECTION 51%
3.0000 mL | Freq: Once | INTRAVENOUS | Status: AC
  Administered 2016-11-11: 3 mL
  Filled 2016-11-11: qty 50

## 2016-11-11 MED ORDER — LIDOCAINE HCL (PF) 1 % IJ SOLN
2.0000 mL | Freq: Once | INTRAMUSCULAR | Status: AC
  Administered 2016-11-11: 2 mL

## 2016-11-11 NOTE — Patient Instructions (Signed)

## 2016-11-11 NOTE — Progress Notes (Signed)
Carl Lee - 61 y.o. male MRN [id number]  Date of birth: 02/05/1956  Office Visit Note: Visit Date: 11/11/2016 PCP: [name], MD Referred by: [name], MD  Subjective: Chief Complaint  Patient presents with  . Lower Back - Pain   HPI: Carl Lee is a 61 year old gentleman that we saw an evaluation and management and decided to complete diagnostic and therapeutic S1 transforaminal injection. Please see our prior notes for evaluation and management and further justification.    ROS Otherwise per HPI.  Assessment & Plan: Visit Diagnoses:  1. Lumbar radiculopathy     Plan: Findings:  Left S1 transforaminal epidural steroid injection from a diagnostic and therapeutic standpoint.    Meds & Orders:  Meds ordered this encounter  Medications  . lidocaine (PF) (XYLOCAINE) 1 % injection 2 mL  . iopamidol (ISOVUE-250) 51 % injection 3 mL  . methylPREDNISolone acetate (DEPO-MEDROL) injection 80 mg    Orders Placed This Encounter  Procedures  . XR C-ARM NO REPORT  . Epidural Steroid injection    Follow-up: Return if symptoms worsen or fail to improve.   Procedures: No procedures performed  S1 Lumbosacral Transforaminal Epidural Steroid Injection - Sub-Pedicular Approach with Fluoroscopic Guidance   Patient: Carl Lee      Date of Birth: May 27, 1956 MRN: [id number] PCP: [name], MD      Visit Date: 11/11/2016   Universal Protocol:    Date/Time: 05/03/182:28 PM  Consent Given By: the patient  Position:  PRONE  Additional Comments: Vital signs were monitored before and after the procedure. Patient was prepped and draped in the usual sterile fashion. The correct patient, procedure, and site was verified.   Injection Procedure Details:  Procedure Site One Meds Administered: No orders of the defined types were placed in this encounter.   Laterality: Left  Location/Site:  S1 Foramen   Needle size: 22 G  Needle type: Spinal  Needle  Placement: Transforaminal  Findings:  -Contrast Used: 1 mL iohexol 180 mg iodine/mL   -Comments: Excellent flow of contrast along the nerve and into the epidural space.  Procedure Details: After squaring off the sacral end-plate to get a true AP view, the C-arm was positioned so that the best possible view of the S1 foramen was visualized. The soft tissues overlying this structure were infiltrated with 2-3 ml. of 1% Lidocaine without Epinephrine.    The spinal needle was inserted toward the target using a "trajectory" view along the fluoroscope beam.  Under AP and lateral visualization, the needle was advanced so it did not puncture dura. Biplanar projections were used to confirm position. Aspiration was confirmed to be negative for CSF and/or blood. A 1-2 ml. volume of Isovue-250 was injected and flow of contrast was noted at each level. Radiographs were obtained for documentation purposes.   After attaining the desired flow of contrast documented above, a 0.5 to 1.0 ml test dose of 0.25% Marcaine was injected into each respective transforaminal space.  The patient was observed for 90 seconds post injection.  After no sensory deficits were reported, and normal lower extremity motor function was noted,   the above injectate was administered so that equal amounts of the injectate were placed at each foramen (level) into the transforaminal epidural space.   Additional Comments:  The patient tolerated the procedure well Dressing: Band-Aid    Post-procedure details: Patient was observed during the procedure. Post-procedure instructions were reviewed.  Patient left the clinic in stable condition.  Clinical History: 11/27/2015 FINDINGS: Severe susceptibility artifact resulting from the aorto bi-iliac stent graft which obscures portions of the lumbar spine suspect she on the STIR images.  Segmentation: Standard.  Alignment: Physiologic.  Vertebrae: Limited evaluation secondary  to cystitis did of the artifact. No fracture, evidence of discitis, or bone lesion.  Conus medullaris: Extends to the L1 level and appears normal.  Paraspinal and other soft tissues: Severe susceptibility artifact resulting from the aorto bi-iliac stent graft .  Disc levels:  Disc spaces: Disc heights are relatively well maintained, but evaluation is limited.  T12-L1: No significant disc protrusion. No evidence of neural foraminal stenosis. No central canal stenosis.  L1-L2: Mild broad-based disc bulge. Mild bilateral facet arthropathy. No evidence of neural foraminal stenosis. No central canal stenosis.  L2-L3: Small central disc protrusion. Mild bilateral facet arthropathy. No evidence of neural foraminal stenosis. No central canal stenosis.  L3-L4: Moderate broad-based disc bulge flattening the ventral thecal sac. Mild bilateral facet arthropathy. Mild spinal stenosis. No foraminal stenosis.  L4-L5: Moderate broad-based disc bulge eccentric towards the right. Moderate bilateral facet arthropathy. Mild -moderate spinal stenosis. No evidence of neural foraminal stenosis.  L5-S1: Broad-based disc bulge with a left paracentral disc protrusion abutting the left intraspinal S1 nerve root. No evidence of neural foraminal stenosis. No central canal stenosis.  FINDINGS: The craniocervical junction appears unremarkable. No vertebral subluxation is observed. No significant abnormal spinal cord signal is observed. No significant vertebral marrow edema is identified.  Intervertebral disc desiccation is observed at all levels in the cervical spine.  Additional findings at individual levels are as follows:  C2-3:  Unremarkable.  C3-4: Mild bilateral foraminal stenosis and mild central narrowing of the thecal sac eccentric to the left due to left eccentric disc bulge, and facet arthropathy.  C4-5: Mild right foraminal stenosis due to uncinate and  facet spurring.  C5-6: Moderate right and borderline left foraminal stenosis due to disc bulge, uncinate spurring, and facet spurring. The previous left paracentral disc protrusion at this level was less apparent.  C6-7: Mild bilateral foraminal stenosis and borderline central narrowing of the thecal sac due to disc bulge and facet arthropathy.  C7-T1: Mild bilateral foraminal stenosis due to disc bulge, and uncinate spurring.  He reports that he quit smoking about 2 years ago. His smoking use included Cigarettes. He has a 40.00 pack-year smoking history. He has never used smokeless tobacco.   Recent Labs  06/24/16 0843  HGBA1C 6.0    Objective:  VS:  HT:    WT:   BMI:     BP:123/74  HR:61bpm  TEMP: ( )  RESP:98 % Physical Exam  Musculoskeletal:  Patient admits is somewhat of an antalgic gait to the left. He has good distal strength.    Ortho Exam Imaging: Xr C-arm No Report  Result Date: 11/11/2016 Please see Notes or Procedures tab for imaging impression.   Past Medical/Family/Surgical/Social History: Medications & Allergies reviewed per EMR Patient Active Problem List   Diagnosis Date Noted  . Lumbar radiculopathy 10/21/2016  . Radiculopathy due to lumbar intervertebral disc disorder 10/21/2016  . Pain in left hip 08/16/2016  . Atrial tachycardia, paroxysmal ([place]) 06/18/2016  . Chronic fatigue 04/27/2016  . CAD (coronary artery disease), native coronary artery   . Abdominal discomfort 06/24/2015  . Hepatic steatosis 06/12/2015  . Right trigeminal neuralgia 11/27/2014  . Chest pain 10/30/2014  . Vitamin B12 deficiency   . OSA (obstructive sleep apnea) 09/04/2014  . Headache 06/20/2014  . Prediabetes  06/08/2014  . Dysphagia 04/19/2014  . Zenker's diverticulum 04/19/2014  . COPD, moderate ([address]) 03/26/2014  . S/P CABG x 3 02/07/2014  . Occlusion and stenosis of vertebral artery without mention of cerebral infarction 11/12/2013  . Skin rash 04/17/2013  .  Healthcare maintenance 01/09/2013  . Dyslipidemia 01/09/2013  . Lumbosacral radiculopathy at S1 09/14/2012  . Hx of migraines   . Arthritis   . History of repair of aneurysm of abdominal aorta using endovascular stent graft 05/01/2012  . Adenomatous polyps 03/24/2012  . Ex-smoker 03/24/2012  . Hypertension 03/24/2012  . Bladder cancer ([place]) 03/24/2012   Past Medical History:  Diagnosis Date  . AAA (abdominal aortic aneurysm) ([place]) 03/25/12   s/p endovascular repair  . Adenomatous polyp 06/2009  . Atrial tachycardia, paroxysmal ([place]) 06/18/2016  . Bladder cancer ([place]) 08/16/2008  . CAD (coronary artery disease), native coronary artery    S/p 3v CABG 01/2014   . Childhood asthma   . Contact dermatitis    atypical [name])  . COPD (chronic obstructive pulmonary disease) ([place])   . Coronary artery disease    a.2015: CABG w/ LIMA-LAD, RIMA-OM, SVG-RCA b. Cath on 07/10/15 w/ patent LIMA-LAD and RIMA-OM. SVG-RCA occluded but collaterals present  . Diverticulosis 2013   severe by CT and colonoscopy  . Environmental allergies    improved as ages  . Fracture of lateral malleolus of left ankle 08/29/2013  . Hepatic steatosis 06/2015   by [place]  . Hx of migraines   . Hypertension   . Lumbosacral radiculopathy at S1 03/2012   left, with spinal stenosis (MRI 04/2013) improved with TF ESI L5/S1 and S1/2 ([place])  . OSA (obstructive sleep apnea) 09/04/2014   Severe with AHI 35/hr  . Prediabetes 06/08/2014   CAD   . Resistance to clopidogrel 2015   drug metabolism panel run - asked to scan  . Spinal stenosis    LS1 nerve root impingement from bulging disc  . Vitamin B12 deficiency   . Zenker diverticulum    Family History  Problem Relation Age of Onset  . Diabetes Mother   . Arrhythmia Mother     pacemaker  . Cancer Mother     Bladder  . COPD Mother   . Thyroid disease Mother   . Hyperlipidemia Mother   . Hypertension Mother   . Diabetes Father   . CAD Father 70    CHF,  MI  . Dementia Father   . Heart attack Father   . Diabetes Sister   . Diabetes Brother   . Hypertension Brother   . Heart attack Maternal Grandmother   . Colon cancer Neg Hx    Past Surgical History:  Procedure Laterality Date  . ABDOMINAL AORTIC ANEURYSM REPAIR  03/25/12   endovascular  . ABI  05/2013   WNL, L TBI low at 0.66  . Bladder cancer  March 2010 and Oct. 2015   [facility]) x 2  . CARDIAC CATHETERIZATION N/A 07/10/2015   Procedure: Left Heart Cath and Cors/Grafts Angiography;  Surgeon: [name], MD;  Location: [facility];  Service: Cardiovascular;  Laterality: N/A;  . COLONOSCOPY  06/2012   hyperplastic polyp, diverticulosis ([place]) rec rpt 5 yrs  . CORONARY ARTERY BYPASS GRAFT N/A 02/07/2014   Procedure: CORONARY ARTERY BYPASS GRAFTING (CABG);  Surgeon: [name], MD;  Location: [facility];  Service: Open Heart Surgery;  Laterality: N/A;  CABG X 3, BILATERAL LIMA, EVH  . CYSTOSCOPY  08/16/08  Bladder Cancer  . EPIDURAL BLOCK INJECTION Left 09/2014, 10/2014, 12/2014   medial L2,3,4, dorsal L5 ramus blocks x2, L L5/S1 and S1/2 transforaminal ESI ([place])  . ESI  04/2013, 06/2013   L L5S1, S12 transforaminal ESI (Dr.  [name])  . ESI Left 04/2014, 05/2014, 06/2014   L5/S1, S1/2; rpt; L4/5  . ESI  03/2016   R L5/S1 interlaminar ESI  . INTRAOPERATIVE TRANSESOPHAGEAL ECHOCARDIOGRAM N/A 02/07/2014   Procedure: INTRAOPERATIVE TRANSESOPHAGEAL ECHOCARDIOGRAM;  Surgeon: [name], MD;  Location: [facility];  Service: Open Heart Surgery;  Laterality: N/A;  . LEFT HEART CATHETERIZATION WITH CORONARY ANGIOGRAM N/A 02/01/2014   Procedure: LEFT HEART CATHETERIZATION WITH CORONARY ANGIOGRAM;  Surgeon: [name], MD;  Location: [facility];  Service: Cardiovascular;  Laterality: N/A;  . PRP epidural injection Left 11/2015   L5/S1, S1/2 transforaminal epidural PRP injections under fluoroscopy ([place])  . TONSILLECTOMY AND  ADENOIDECTOMY  1973  . VASECTOMY     Social History   Occupational History  . Self Employed    Social History Main Topics  . Smoking status: Former Smoker    Packs/day: 1.00    Years: 40.00    Types: Cigarettes    Quit date: 02/05/2014  . Smokeless tobacco: Never Used  . Alcohol use 0.0 oz/week     Comment: Rare- states 1 oz. per week  . Drug use: No  . Sexual activity: Not on file

## 2016-11-11 NOTE — Telephone Encounter (Signed)
New message    Pt is calling to find out about his Bipap.

## 2016-11-11 NOTE — Progress Notes (Deleted)
Patient is here today for planned Left S1 transforaminal injection. No change in symptoms.

## 2016-11-11 NOTE — Procedures (Signed)
S1 Lumbosacral Transforaminal Epidural Steroid Injection - Sub-Pedicular Approach with Fluoroscopic Guidance   Patient: Carl Lee      Date of Birth: October 12, 1955 MRN: [id number] PCP: [name], MD      Visit Date: 11/11/2016   Universal Protocol:    Date/Time: 05/03/182:28 PM  Consent Given By: the patient  Position:  PRONE  Additional Comments: Vital signs were monitored before and after the procedure. Patient was prepped and draped in the usual sterile fashion. The correct patient, procedure, and site was verified.   Injection Procedure Details:  Procedure Site One Meds Administered: No orders of the defined types were placed in this encounter.   Laterality: Left  Location/Site:  S1 Foramen   Needle size: 22 G  Needle type: Spinal  Needle Placement: Transforaminal  Findings:  -Contrast Used: 1 mL iohexol 180 mg iodine/mL   -Comments: Excellent flow of contrast along the nerve and into the epidural space.  Procedure Details: After squaring off the sacral end-plate to get a true AP view, the C-arm was positioned so that the best possible view of the S1 foramen was visualized. The soft tissues overlying this structure were infiltrated with 2-3 ml. of 1% Lidocaine without Epinephrine.    The spinal needle was inserted toward the target using a "trajectory" view along the fluoroscope beam.  Under AP and lateral visualization, the needle was advanced so it did not puncture dura. Biplanar projections were used to confirm position. Aspiration was confirmed to be negative for CSF and/or blood. A 1-2 ml. volume of Isovue-250 was injected and flow of contrast was noted at each level. Radiographs were obtained for documentation purposes.   After attaining the desired flow of contrast documented above, a 0.5 to 1.0 ml test dose of 0.25% Marcaine was injected into each respective transforaminal space.  The patient was observed for 90 seconds post injection.  After no sensory  deficits were reported, and normal lower extremity motor function was noted,   the above injectate was administered so that equal amounts of the injectate were placed at each foramen (level) into the transforaminal epidural space.   Additional Comments:  The patient tolerated the procedure well Dressing: Band-Aid    Post-procedure details: Patient was observed during the procedure. Post-procedure instructions were reviewed.  Patient left the clinic in stable condition.

## 2016-11-12 ENCOUNTER — Ambulatory Visit (INDEPENDENT_AMBULATORY_CARE_PROVIDER_SITE_OTHER): Payer: PPO | Admitting: Cardiovascular Disease

## 2016-11-12 ENCOUNTER — Encounter: Payer: Self-pay | Admitting: Cardiovascular Disease

## 2016-11-12 VITALS — BP 112/70 | HR 90 | Ht 71.0 in | Wt 208.4 lb

## 2016-11-12 DIAGNOSIS — I2510 Atherosclerotic heart disease of native coronary artery without angina pectoris: Secondary | ICD-10-CM

## 2016-11-12 MED ORDER — AMLODIPINE BESYLATE 5 MG PO TABS: 5.0000 mg | ORAL_TABLET | Freq: Every day | ORAL | 3 refills | Status: DC

## 2016-11-12 NOTE — Progress Notes (Signed)
Cardiology Office Note:    Date:  11/12/2016   ID:  Carl Lee, DOB 04/15/1956, MRN [id number]  PCP:  [name], MD  Cardiologist:  Dr. [name]   Electrophysiologist:  N/a Pulmonology: Dr. [name] GI: Dr. Ardis Hughs  Referring MD: [name], MD   Chief Complaint  Patient presents with  . Coronary Artery Disease    History of Present Illness:    Carl Lee is a 61 y.o. male with a hx of CAD s/p CABG in 2015 with Dr. [name] (L-LAD, RIMA-OM, S-PDA), recurrent bladder CA, carotid artery disease, AAA s/p endovascular repair in 9/13 by Dr. [name], HTN, HL, DM2, OSA, tobacco abuse.   Myoview in 12/16 was intermediate risk with anterior, anteroseptal and apical scar with peri-infarct ischemia.  LHC demonstrated severe native 3 vessel CAD with patent LIMA-LAD and patent free RIMA-OM3. Native RCA was occluded and filled distally from left to right collaterals. SVG-RCA was occluded. Medical therapy was recommended.     VVS follows PVD with last [place] 11/24/15 residual AAA 4.0 cm   Recent titration of CPAP by Dr Radford Pax   Lumbar Radiculopathy with steroid injection 11/11/16   Thinking of buying house in [place] Both sons are there one charters a boat and [place] With him a lot  Prior CV studies that were reviewed today include:    Holter Monitor:  Reviewed 03/25/16   SR average HR 72 Isolated PAC;s / PVC;s Some short runs 10-15 beats atrial tachycardia  Echo 11/11/15 Vigorous LVF, EF 65-70%, no RWMA, Gr 1 DD, reduced RVSF, PASP 24 mmHg  24 Hr BP Monitor 4/17 Avg BP in normal range  LHC 07/10/15 LAD 100% LCx ostial 60%, OM1 40%, OM3 40% RCA 100% - dist RCA fills form L-R collats S-RCA 100% L-LAD ok Free RIMA-OM3 ok EF normal Med Rx  Myoview 06/25/15 Anterior, anteroseptal, apical scar with peri-infarct ischemia, EF 60%, intermediate risk  Abd/Pelvic CTA 11/15 Patent bilateral renal arteries.  LHC (7/15):  pLAD 30 then 100 (L-L collats),  pCFX 80, mCFX 50, pRCA 100 (R-R and L-R collats), EF 50%, ant HK and poss LV apical aneurysm  Nuclear (7/15): Inferior and anteroseptal, mid and dist anterior and dist inferior and apical fixed defect, no ischemia, EF 49%; High Risk  Carotid US (7/15):  Bilateral ICA 1-39%  Past Medical History:  Diagnosis Date  . AAA (abdominal aortic aneurysm) ([place]) 03/25/12   s/p endovascular repair  . Adenomatous polyp 06/2009  . Atrial tachycardia, paroxysmal ([place]) 06/18/2016  . Bladder cancer ([place]) 08/16/2008  . CAD (coronary artery disease), native coronary artery    S/p 3v CABG 01/2014   . Childhood asthma   . Contact dermatitis    atypical [name])  . COPD (chronic obstructive pulmonary disease) ([place])   . Coronary artery disease    a.2015: CABG w/ LIMA-LAD, RIMA-OM, SVG-RCA b. Cath on 07/10/15 w/ patent LIMA-LAD and RIMA-OM. SVG-RCA occluded but collaterals present  . Diverticulosis 2013   severe by CT and colonoscopy  . Environmental allergies    improved as ages  . Fracture of lateral malleolus of left ankle 08/29/2013  . Hepatic steatosis 06/2015   by [place]  . Hx of migraines   . Hypertension   . Lumbosacral radiculopathy at S1 03/2012   left, with spinal stenosis (MRI 04/2013) improved with TF ESI L5/S1 and S1/2 ([facility] [place])  . OSA (obstructive sleep apnea) 09/04/2014   Severe with AHI 35/hr  . Prediabetes 06/08/2014   CAD   .  Resistance to clopidogrel 2015   drug metabolism panel run - asked to scan  . Spinal stenosis    LS1 nerve root impingement from bulging disc  . Vitamin B12 deficiency   . Zenker diverticulum     Past Surgical History:  Procedure Laterality Date  . ABDOMINAL AORTIC ANEURYSM REPAIR  03/25/12   endovascular  . ABI  05/2013   WNL, L TBI low at 0.66  . Bladder cancer  March 2010 and Oct. 2015   [facility] Spell) x 2  . CARDIAC CATHETERIZATION N/A 07/10/2015   Procedure: Left Heart Cath and Cors/Grafts Angiography;  Surgeon: [name], MD;   Location: [facility];  Service: Cardiovascular;  Laterality: N/A;  . COLONOSCOPY  06/2012   hyperplastic polyp, diverticulosis ([place]) rec rpt 5 yrs  . CORONARY ARTERY BYPASS GRAFT N/A 02/07/2014   Procedure: CORONARY ARTERY BYPASS GRAFTING (CABG);  Surgeon: [name], MD;  Location: [facility];  Service: Open Heart Surgery;  Laterality: N/A;  CABG X 3, BILATERAL LIMA, EVH  . CYSTOSCOPY  08/16/08   Bladder Cancer  . EPIDURAL BLOCK INJECTION Left 09/2014, 10/2014, 12/2014   medial L2,3,4, dorsal L5 ramus blocks x2, L L5/S1 and S1/2 transforaminal ESI ([facility] [place])  . ESI  04/2013, 06/2013   L L5S1, S12 transforaminal ESI (Dr.  [name])  . ESI Left 04/2014, 05/2014, 06/2014   L5/S1, S1/2; rpt; L4/5  . ESI  03/2016   R L5/S1 interlaminar ESI  . INTRAOPERATIVE TRANSESOPHAGEAL ECHOCARDIOGRAM N/A 02/07/2014   Procedure: INTRAOPERATIVE TRANSESOPHAGEAL ECHOCARDIOGRAM;  Surgeon: [name], MD;  Location: [facility];  Service: Open Heart Surgery;  Laterality: N/A;  . LEFT HEART CATHETERIZATION WITH CORONARY ANGIOGRAM N/A 02/01/2014   Procedure: LEFT HEART CATHETERIZATION WITH CORONARY ANGIOGRAM;  Surgeon: [name], MD;  Location[facility];  Service: Cardiovascular;  Laterality: N/A;  . PRP epidural injection Left 11/2015   L5/S1, S1/2 transforaminal epidural PRP injections under fluoroscopy ([place])  . TONSILLECTOMY AND ADENOIDECTOMY  1973  . VASECTOMY      Current Medications: Current Meds  Medication Sig  . albuterol (PROAIR HFA) 108 (90 BASE) MCG/ACT inhaler Inhale 2 puffs into the lungs every 6 (six) hours as needed for wheezing or shortness of breath.  [name] amLODipine (NORVASC) 5 MG tablet Take 1 tablet (5 mg total) by mouth daily.  [name] aspirin EC 325 MG EC tablet Take 1 tablet (325 mg total) by mouth daily.  [name] atorvastatin (LIPITOR) 20 MG tablet TAKE 1 TABLET (20 MG TOTAL) BY MOUTH DAILY AT 6 PM.  . fluticasone furoate-vilanterol (BREO ELLIPTA)  100-25 MCG/INH AEPB Inhale 1 puff into the lungs daily.  . metoprolol tartrate (LOPRESSOR) 25 MG tablet Take 12.5 mg by mouth 2 (two) times daily.  . vitamin B-12 (CYANOCOBALAMIN) 500 MCG tablet Take 1 tablet (500 mcg total) by mouth daily.  . [DISCONTINUED] amLODipine (NORVASC) 5 MG tablet Take 5 mg by mouth daily.  . [DISCONTINUED] metoprolol tartrate (LOPRESSOR) 25 MG tablet Take 1 tablet (25 mg total) by mouth 2 (two) times daily.   Current Facility-Administered Medications for the 11/12/16 encounter (Office Visit) with Josue Hector, MD  Medication  . umeclidinium-vilanterol (ANORO ELLIPTA) 62.5-25 MCG/INH 1 puff     Allergies:   Codeine and Doxycycline   Social History   Social History  . Marital status: Married    Spouse name: N/A  . Number of children: 2  . Years of education: N/A   Occupational History  .  Self Employed    Social History Main Topics  . Smoking status: Former Smoker    Packs/day: 1.00    Years: 40.00    Types: Cigarettes    Quit date: 02/05/2014  . Smokeless tobacco: Never Used  . Alcohol use 0.0 oz/week     Comment: Rare- states 1 oz. per week  . Drug use: No  . Sexual activity: Not Asked   Other Topics Concern  . None   Social History Narrative   Caffeine: 1 cup coffee/day   Lives with wife, 1 dog   grown children   Occupation: Therapist, sports - self employed.  Disability after CABG   Edu: 2 yrs college   Activity: fishing, walking occasionally   Diet: good water, fruits/vegetables daily     Family History:  The patient's family history includes Arrhythmia in his mother; CAD (age of onset: 51) in his father; COPD in his mother; Cancer in his mother; Dementia in his father; Diabetes in his brother, father, mother, and sister; Heart attack in his father and maternal grandmother; Hyperlipidemia in his mother; Hypertension in his brother and mother; Thyroid disease in his mother.   ROS:   Please see the history of present illness.      Review of Systems  Constitution: Positive for malaise/fatigue.  Eyes: Positive for visual disturbance.  Cardiovascular: Positive for chest pain, dyspnea on exertion and irregular heartbeat.  Respiratory: Positive for wheezing.   Hematologic/Lymphatic: Bruises/bleeds easily.  Skin: Positive for rash.  Musculoskeletal: Positive for back pain.   All other systems reviewed and are negative.   EKGs/Labs/Other Test Reviewed:    EKG:  EKG is  ordered today.  The ekg ordered today demonstrates sinus brady, HR 51, normal axis, septal Q waves, QTc 383 ms, no changes.   Recent Labs: 03/19/2016: Hemoglobin 15.7; Platelets 238 06/24/2016: ALT 7; BUN 20; Creatinine, Ser 0.81; Potassium 4.7; Sodium 141 09/23/2016: TSH 2.76   Recent Lipid Panel    Component Value Date/Time   CHOL 99 06/24/2016 0843   CHOL 170 01/05/2012   TRIG 93.0 06/24/2016 0843   TRIG 141 01/05/2012   HDL 40.30 06/24/2016 0843   CHOLHDL 2 06/24/2016 0843   VLDL 18.6 06/24/2016 0843   LDLCALC 40 06/24/2016 0843   LDLDIRECT 105 01/05/2012     Physical Exam:    VS:  BP 112/70   Pulse 90   Ht 5\' 11"  (1.803 m)   Wt 208 lb 6.4 oz (94.5 kg)   SpO2 93%   BMI 29.07 kg/m     Wt Readings from Last 3 Encounters:  11/12/16 208 lb 6.4 oz (94.5 kg)  06/28/16 205 lb 9.6 oz (93.3 kg)  06/18/16 207 lb (93.9 kg)     Physical Exam  Constitutional: He is oriented to person, place, and time. He appears well-developed and well-nourished. No distress.  Obese white male  HENT:  Head: Normocephalic and atraumatic.  Eyes: Pupils are equal, round, and reactive to light. No scleral icterus.  Neck: Normal range of motion. No JVD present.  Cardiovascular: Normal rate, regular rhythm, S1 normal and S2 normal.  Exam reveals no gallop and no friction rub.   No murmur heard. Pulmonary/Chest: Effort normal and breath sounds normal. He has no decreased breath sounds. He has no wheezes. He has no rhonchi. He has no rales. He exhibits  tenderness.  Abdominal: Soft. There is no tenderness.  Musculoskeletal: He exhibits no edema.  Neurological: He is alert and oriented to person, place, and  time.  Skin: Skin is warm and dry.  Psychiatric: He has a normal mood and affect.    ASSESSMENT:    1. Coronary artery disease    PLAN:    In order of problems listed above:  1. CAD - s/p CABG.  LHC in 12/16 was done for chest pain and abnormal nuclear test and demonstrated 2/3 grafts patent, CTO of RCA and occluded S-RCA.  Distal RCA filled by L-R collaterals and med Rx was recommended.  Nitrates were not added during his admit b/c of labile BPs and severe HA.  As noted, he has had a lot of chest pain.  Some of his symptoms improved with antianginal Rx.  He is currently having MSK chest pain.  Proceed with workup as above.  Continue ASA, statin, beta-blocker, Ranolazine.   2. HTN -  His BP runs low at times and he is symptomatic.  I have asked him to decrease Lisinopril to 5 mg QD and to keep an eye on his BP  3. HL - Continue statin.   4. AAA s/p Endovascular repair - FU with [facility].     5. Fatigue - This is a chronic issue.  Check CBC, BMET, TSH.  6. Palpitations - Probably PVCs.  Check 48 Hr Holter.  7. Zenker's Diverticulum - He has chest pain, dysphagia, odynophagia.  He notes food getting stuck in his throat for hours at a time. I have recommended he see GI.  Refer back to Dr. Ardis Hughs.   8. OSA:  CPAP titration per Dr Onnie Boer MD [facility]

## 2016-11-12 NOTE — Patient Instructions (Signed)
Medication Instructions:  None  Labwork: None  Testing/Procedures: None  Follow-Up:. Your physician wants you to follow-up in: 6 months with Dr. [name].  You will receive a reminder letter in the mail two months in advance. If you don't receive a letter, please call our office to schedule the follow-up appointment.   Any Other Special Instructions Will Be Listed Below (If Applicable).     If you need a refill on your cardiac medications before your next appointment, please call your pharmacy.   

## 2016-11-17 NOTE — Telephone Encounter (Signed)
Spoke to the patient today and informed him that his insurance has finally approved his Bipap machine.  Patient states he has a date for set-up on 5/17 with [facility]. Patient was thankful for the call

## 2016-11-25 DIAGNOSIS — G4733 Obstructive sleep apnea (adult) (pediatric): Secondary | ICD-10-CM | POA: Diagnosis not present

## 2016-11-28 ENCOUNTER — Encounter: Payer: Self-pay | Admitting: Cardiology

## 2016-12-01 DIAGNOSIS — G4733 Obstructive sleep apnea (adult) (pediatric): Secondary | ICD-10-CM | POA: Diagnosis not present

## 2016-12-02 ENCOUNTER — Telehealth (INDEPENDENT_AMBULATORY_CARE_PROVIDER_SITE_OTHER): Payer: Self-pay | Admitting: Physical Medicine and Rehabilitation

## 2016-12-02 NOTE — Telephone Encounter (Signed)
Scheduled for an OV. He said the leg pain down to his foot is improved, now has this only when he sits a certain way.

## 2016-12-02 NOTE — Telephone Encounter (Signed)
I would OV but ask him about leg pain than went to foot?

## 2016-12-09 ENCOUNTER — Encounter (HOSPITAL_BASED_OUTPATIENT_CLINIC_OR_DEPARTMENT_OTHER): Payer: PPO

## 2016-12-13 NOTE — Telephone Encounter (Addendum)
Patient has a 10 week sleep f/u scheduled for 01/26/2017 @2 :40pm. Patient range is from 6-17-8-15.

## 2016-12-14 ENCOUNTER — Encounter (INDEPENDENT_AMBULATORY_CARE_PROVIDER_SITE_OTHER): Payer: Self-pay | Admitting: Physical Medicine and Rehabilitation

## 2016-12-14 ENCOUNTER — Ambulatory Visit (INDEPENDENT_AMBULATORY_CARE_PROVIDER_SITE_OTHER): Payer: PPO

## 2016-12-14 ENCOUNTER — Ambulatory Visit (INDEPENDENT_AMBULATORY_CARE_PROVIDER_SITE_OTHER): Payer: PPO | Admitting: Physical Medicine and Rehabilitation

## 2016-12-14 VITALS — BP 142/87 | HR 52

## 2016-12-14 DIAGNOSIS — G894 Chronic pain syndrome: Secondary | ICD-10-CM | POA: Diagnosis not present

## 2016-12-14 DIAGNOSIS — M25552 Pain in left hip: Secondary | ICD-10-CM

## 2016-12-14 DIAGNOSIS — M1612 Unilateral primary osteoarthritis, left hip: Secondary | ICD-10-CM

## 2016-12-14 DIAGNOSIS — M5116 Intervertebral disc disorders with radiculopathy, lumbar region: Secondary | ICD-10-CM

## 2016-12-14 NOTE — Progress Notes (Deleted)
Back was doing better after injection. Pain increases with standing and walking. Recently had table fall on back and started having some increase in pain. Says back he could probably tolerate, although he would like it to be better. Still having left hip pain with any activity. Difficulty walking. The pain and numbness in leg has improved. Now has this only when sitting and it is mostly only on left thigh.

## 2016-12-16 ENCOUNTER — Encounter (INDEPENDENT_AMBULATORY_CARE_PROVIDER_SITE_OTHER): Payer: Self-pay | Admitting: Physical Medicine and Rehabilitation

## 2016-12-16 DIAGNOSIS — G894 Chronic pain syndrome: Secondary | ICD-10-CM | POA: Diagnosis not present

## 2016-12-16 DIAGNOSIS — M1612 Unilateral primary osteoarthritis, left hip: Secondary | ICD-10-CM | POA: Diagnosis not present

## 2016-12-16 DIAGNOSIS — M5116 Intervertebral disc disorders with radiculopathy, lumbar region: Secondary | ICD-10-CM | POA: Diagnosis not present

## 2016-12-16 DIAGNOSIS — M25552 Pain in left hip: Secondary | ICD-10-CM

## 2016-12-16 MED ORDER — BUPIVACAINE HCL 0.5 % IJ SOLN
3.0000 mL | INTRAMUSCULAR | Status: AC | PRN
  Administered 2016-12-16: 3 mL via INTRA_ARTICULAR

## 2016-12-16 MED ORDER — TRIAMCINOLONE ACETONIDE 40 MG/ML IJ SUSP
80.0000 mg | INTRAMUSCULAR | Status: AC | PRN
  Administered 2016-12-16: 80 mg via INTRA_ARTICULAR

## 2016-12-16 NOTE — Patient Instructions (Signed)

## 2016-12-16 NOTE — Progress Notes (Signed)
Carl Lee - 61 y.o. male MRN [id number]  Date of birth: [birth year]  Office Visit Note: Visit Date: 12/14/2016 PCP: [name], MD Referred by: [name], MD  Subjective: Chief Complaint  Patient presents with  . Lower Back - Pain  . Left Hip - Pain   HPI: Carl Lee is a very pleasant 61 year old gentleman with a complicated heart history and collocated chronic pain history. Briefly again he was sent to [place] by Dr. Neomia Dear for continued interventional spine care. We first saw him know he is having left hip and groin pain and we did look at an old x-ray image of his hip from 2016 and he did show some superior lateral joint space narrowing with osteophytes on the left and completed the left hip diagnostic anesthetic arthrogram to get him quite a bit relief. He continued to have radicular type pain no that was in the buttock and leg to the foot with numbness. We ultimately try an L5 transforaminal injection and he had an MRI showing disc protrusion on the left side at L5-S1. This only gave him temporary relief of more of his buttock pain but nothing in the leg. We would on to complete an S1 transforaminal injection most recently and this was done at the beginning of May. He reports almost 100% resolution of his leg pain and he has no numbness or tingling at this point. This was diagnostic and therapeutic. He continues to have left hip pain now. The hip injection was completed back in February. He's done well up until just recently with his hip. He gets some pain with movement and some pain in the anterior lateral area some radiating to the thigh anteriorly. He has no other issues in the spine that would cause this. He also continues to have neck pain and shoulder pain without some minor issue at this point. He does take some mild pain medication on occasion at night. Through Dr. [name] he had had PRP injections into the spine which she said at that time helps. He's had  no new trauma or focal weakness. He's had no bowel or bladder dysfunction. Otherwise she's doing okay. He does report that his left hip pain is debilitating that he can do much around the house that is starting to hurt while he stands and walks.    Review of Systems  Constitutional: Negative for chills, fever, malaise/fatigue and weight loss.  HENT: Negative for hearing loss and sinus pain.   Eyes: Negative for blurred vision, double vision and photophobia.  Respiratory: Negative for cough and shortness of breath.   Cardiovascular: Negative for chest pain, palpitations and leg swelling.  Gastrointestinal: Negative for abdominal pain, nausea and vomiting.  Genitourinary: Negative for flank pain.  Musculoskeletal: Positive for back pain and joint pain. Negative for myalgias.  Skin: Negative for itching and rash.  Neurological: Negative for tremors, focal weakness and weakness.  Endo/Heme/Allergies: Negative.   Psychiatric/Behavioral: Negative for depression.  All other systems reviewed and are negative.  Otherwise per HPI.  Assessment & Plan: Visit Diagnoses:  1. Pain in left hip   2. Radiculopathy due to lumbar intervertebral disc disorder   3. Chronic pain syndrome   4. Unilateral primary osteoarthritis, left hip     Plan: Findings:  Patient has left hip and groin pain worse with internal rotation and somewhat external rotation. He has no pain over the greater trochanters I do feel like this is an intra-articular process. We did look at his arthrogram  which did not show an extravasation but did show worsening of the left superior lateral joint space narrowing and spurring from 2016. Since we did have fluoroscopic imaging I have not looked at specific complaint phlegm x-ray. I think is wise to complete hip injection today once again diagnostically and therapeutically only because he's had these 2 issues simultaneously with his back and leg. His leg pain is on her percent relief of S1  transforaminal epidural steroid injection. He does have small disc protrusion at L5-S1. If he gets good relief once again with hip injection we'll watch him and see how long he gets. I did talk to him about seeing one of the orthopedic surgeons, Dr. [name] in the office for evaluation of his hip if his hip pain returned fairly abruptly. It does seem to be worsening since 2016. He has some level of reshaping of the femoral head. He does not have classic FAI but he does have some cam shape of the femoral head. Injection was completed today and we'll seek retro-authorization through his insurance. He did get good relief diagnostically with the injection today of his left hip.    Meds & Orders: No orders of the defined types were placed in this encounter.   Orders Placed This Encounter  Procedures  . Large Joint Injection/Arthrocentesis  . XR C-ARM NO REPORT    Follow-up: Return if symptoms worsen or fail to improve.   Procedures: Large Joint Inj Date/Time: 12/16/2016 5:15 AM Performed by: Magnus Sinning Authorized by: Magnus Sinning   Consent Given by:  Patient Site marked: the procedure site was marked   Timeout: prior to procedure the correct patient, procedure, and site was verified   Indications:  Pain and diagnostic evaluation Location:  Hip Site:  L hip joint Prep: patient was prepped and draped in usual sterile fashion   Needle Size:  22 G Needle Length:  3.5 inches Approach:  Anterior Ultrasound Guidance: No   Fluoroscopic Guidance: Yes   Arthrogram: No   Medications:  80 mg triamcinolone acetonide 40 MG/ML; 3 mL bupivacaine 0.5 % Aspiration Attempted: Yes   Patient tolerance:  Patient tolerated the procedure well with no immediate complications  There was excellent flow of contrast producing a partial arthrogram of the hip. The patient did have relief of symptoms during the anesthetic phase of the injection.    No notes on file   Clinical  History: 11/27/2015 FINDINGS: Severe susceptibility artifact resulting from the aorto bi-iliac stent graft which obscures portions of the lumbar spine suspect she on the STIR images.  Segmentation: Standard.  Alignment: Physiologic.  Vertebrae: Limited evaluation secondary to cystitis did of the artifact. No fracture, evidence of discitis, or bone lesion.  Conus medullaris: Extends to the L1 level and appears normal.  Paraspinal and other soft tissues: Severe susceptibility artifact resulting from the aorto bi-iliac stent graft .  Disc levels:  Disc spaces: Disc heights are relatively well maintained, but evaluation is limited.  T12-L1: No significant disc protrusion. No evidence of neural foraminal stenosis. No central canal stenosis.  L1-L2: Mild broad-based disc bulge. Mild bilateral facet arthropathy. No evidence of neural foraminal stenosis. No central canal stenosis.  L2-L3: Small central disc protrusion. Mild bilateral facet arthropathy. No evidence of neural foraminal stenosis. No central canal stenosis.  L3-L4: Moderate broad-based disc bulge flattening the ventral thecal sac. Mild bilateral facet arthropathy. Mild spinal stenosis. No foraminal stenosis.  L4-L5: Moderate broad-based disc bulge eccentric towards the right. Moderate bilateral facet arthropathy. Mild -  moderate spinal stenosis. No evidence of neural foraminal stenosis.  L5-S1: Broad-based disc bulge with a left paracentral disc protrusion abutting the left intraspinal S1 nerve root. No evidence of neural foraminal stenosis. No central canal stenosis.  FINDINGS: The craniocervical junction appears unremarkable. No vertebral subluxation is observed. No significant abnormal spinal cord signal is observed. No significant vertebral marrow edema is identified.  Intervertebral disc desiccation is observed at all levels in the cervical spine.  Additional findings at individual levels  are as follows:  C2-3:  Unremarkable.  C3-4: Mild bilateral foraminal stenosis and mild central narrowing of the thecal sac eccentric to the left due to left eccentric disc bulge, and facet arthropathy.  C4-5: Mild right foraminal stenosis due to uncinate and facet spurring.  C5-6: Moderate right and borderline left foraminal stenosis due to disc bulge, uncinate spurring, and facet spurring. The previous left paracentral disc protrusion at this level was less apparent.  C6-7: Mild bilateral foraminal stenosis and borderline central narrowing of the thecal sac due to disc bulge and facet arthropathy.  C7-T1: Mild bilateral foraminal stenosis due to disc bulge, and uncinate spurring.  He reports that he quit smoking about 2 years ago. His smoking use included Cigarettes. He has a 40.00 pack-year smoking history. He has never used smokeless tobacco.   Recent Labs  06/24/16 0843  HGBA1C 6.0    Objective:  VS:  HT:    WT:   BMI:     BP:(!) 142/87  HR:(!) 52bpm  TEMP: ( )  RESP:  Physical Exam  Constitutional: He is oriented to person, place, and time. He appears well-developed and well-nourished. No distress.  HENT:  Head: Normocephalic and atraumatic.  Eyes: Conjunctivae are normal. Pupils are equal, round, and reactive to light.  Neck: Normal range of motion. Neck supple.  Cardiovascular: Regular rhythm and intact distal pulses.   Pulmonary/Chest: Effort normal. No respiratory distress.  Musculoskeletal:  Patient is slow to rise from a seated position. He has pain with internal rotation of the left hip. He has good distal strength without any deficits. He has no clonus bilaterally. He has no pain over the greater trochanters.  Neurological: He is alert and oriented to person, place, and time.  Skin: Skin is warm and dry. No rash noted. No erythema.  Psychiatric: He has a normal mood and affect.  Nursing note and vitals reviewed.   Ortho Exam Imaging: No results  found.  Past Medical/Family/Surgical/Social History: Medications & Allergies reviewed per EMR Patient Active Problem List   Diagnosis Date Noted  . Lumbar radiculopathy 10/21/2016  . Radiculopathy due to lumbar intervertebral disc disorder 10/21/2016  . Pain in left hip 08/16/2016  . Atrial tachycardia, paroxysmal ([place]) 06/18/2016  . Chronic fatigue 04/27/2016  . CAD (coronary artery disease), native coronary artery   . Abdominal discomfort 06/24/2015  . Hepatic steatosis 06/12/2015  . Right trigeminal neuralgia 11/27/2014  . Chest pain 10/30/2014  . Vitamin B12 deficiency   . OSA (obstructive sleep apnea) 09/04/2014  . Headache 06/20/2014  . Prediabetes 06/08/2014  . Dysphagia 04/19/2014  . Zenker's diverticulum 04/19/2014  . COPD, moderate ([place]) 03/26/2014  . S/P CABG x 3 02/07/2014  . Occlusion and stenosis of vertebral artery without mention of cerebral infarction 11/12/2013  . Skin rash 04/17/2013  . Healthcare maintenance 01/09/2013  . Dyslipidemia 01/09/2013  . Lumbosacral radiculopathy at S1 09/14/2012  . Hx of migraines   . Arthritis   . History of repair of aneurysm of abdominal aorta  using endovascular stent graft 05/01/2012  . Adenomatous polyps 03/24/2012  . Ex-smoker 03/24/2012  . Hypertension 03/24/2012  . Bladder cancer ([place]) 03/24/2012   Past Medical History:  Diagnosis Date  . AAA (abdominal aortic aneurysm) ([place]) 03/25/12   s/p endovascular repair  . Adenomatous polyp 06/2009  . Atrial tachycardia, paroxysmal ([place]) 06/18/2016  . Bladder cancer ([place]) 08/16/2008  . CAD (coronary artery disease), native coronary artery    S/p 3v CABG 01/2014   . Childhood asthma   . Contact dermatitis    atypical [name])  . COPD (chronic obstructive pulmonary disease) ([place])   . Coronary artery disease    a.2015: CABG w/ LIMA-LAD, RIMA-OM, SVG-RCA b. Cath on 07/10/15 w/ patent LIMA-LAD and RIMA-OM. SVG-RCA occluded but collaterals present  . Diverticulosis 2013    severe by CT and colonoscopy  . Environmental allergies    improved as ages  . Fracture of lateral malleolus of left ankle 08/29/2013  . Hepatic steatosis 06/2015   by [place]  . Hx of migraines   . Hypertension   . Lumbosacral radiculopathy at S1 03/2012   left, with spinal stenosis (MRI 04/2013) improved with TF ESI L5/S1 and S1/2 ([facility] [place])  . OSA (obstructive sleep apnea) 09/04/2014   Severe with AHI 35/hr  . Prediabetes 06/08/2014   CAD   . Resistance to clopidogrel 2015   drug metabolism panel run - asked to scan  . Spinal stenosis    LS1 nerve root impingement from bulging disc  . Vitamin B12 deficiency   . Zenker diverticulum    Family History  Problem Relation Age of Onset  . Diabetes Mother   . Arrhythmia Mother        pacemaker  . Cancer Mother        Bladder  . COPD Mother   . Thyroid disease Mother   . Hyperlipidemia Mother   . Hypertension Mother   . Diabetes Father   . CAD Father 88       CHF, MI  . Dementia Father   . Heart attack Father   . Diabetes Sister   . Diabetes Brother   . Hypertension Brother   . Heart attack Maternal Grandmother   . Colon cancer Neg Hx    Past Surgical History:  Procedure Laterality Date  . ABDOMINAL AORTIC ANEURYSM REPAIR  03/25/12   endovascular  . ABI  05/2013   WNL, L TBI low at 0.66  . Bladder cancer  March 2010 and Oct. 2015   [facility] Spell) x 2  . CARDIAC CATHETERIZATION N/A 07/10/2015   Procedure: Left Heart Cath and Cors/Grafts Angiography;  Surgeon: [name], MD;  Location: [facility];  Service: Cardiovascular;  Laterality: N/A;  . COLONOSCOPY  06/2012   hyperplastic polyp, diverticulosis ([place]) rec rpt 5 yrs  . CORONARY ARTERY BYPASS GRAFT N/A 02/07/2014   Procedure: CORONARY ARTERY BYPASS GRAFTING (CABG);  Surgeon: [name], MD;  Location: [facility];  Service: Open Heart Surgery;  Laterality: N/A;  CABG X 3, BILATERAL LIMA, EVH  . CYSTOSCOPY  08/16/08   Bladder Cancer  . EPIDURAL  BLOCK INJECTION Left 09/2014, 10/2014, 12/2014   medial L2,3,4, dorsal L5 ramus blocks x2, L L5/S1 and S1/2 transforaminal ESI ([facility] [place])  . ESI  04/2013, 06/2013   L L5S1, S12 transforaminal ESI (Dr.  [name])  . ESI Left 04/2014, 05/2014, 06/2014   L5/S1, S1/2; rpt; L4/5  . ESI  03/2016   R L5/S1 interlaminar ESI  .  INTRAOPERATIVE TRANSESOPHAGEAL ECHOCARDIOGRAM N/A 02/07/2014   Procedure: INTRAOPERATIVE TRANSESOPHAGEAL ECHOCARDIOGRAM;  Surgeon: [name], MD;  Location: [facility];  Service: Open Heart Surgery;  Laterality: N/A;  . LEFT HEART CATHETERIZATION WITH CORONARY ANGIOGRAM N/A 02/01/2014   Procedure: LEFT HEART CATHETERIZATION WITH CORONARY ANGIOGRAM;  Surgeon: [name], MD;  Location: [facility] CATH [facility];  Service: Cardiovascular;  Laterality: N/A;  . PRP epidural injection Left 11/2015   L5/S1, S1/2 transforaminal epidural PRP injections under fluoroscopy ([place])  . TONSILLECTOMY AND ADENOIDECTOMY  1973  . VASECTOMY     Social History   Occupational History  . Self Employed    Social History Main Topics  . Smoking status: Former Smoker    Packs/day: 1.00    Years: 40.00    Types: Cigarettes    Quit date: 02/05/2014  . Smokeless tobacco: Never Used  . Alcohol use 0.0 oz/week     Comment: Rare- states 1 oz. per week  . Drug use: No  . Sexual activity: Not on file

## 2016-12-26 DIAGNOSIS — G4733 Obstructive sleep apnea (adult) (pediatric): Secondary | ICD-10-CM | POA: Diagnosis not present

## 2016-12-27 ENCOUNTER — Encounter (HOSPITAL_BASED_OUTPATIENT_CLINIC_OR_DEPARTMENT_OTHER): Payer: PPO

## 2016-12-28 ENCOUNTER — Ambulatory Visit (INDEPENDENT_AMBULATORY_CARE_PROVIDER_SITE_OTHER): Payer: PPO | Admitting: Family Medicine

## 2016-12-28 ENCOUNTER — Encounter: Payer: Self-pay | Admitting: Family Medicine

## 2016-12-28 VITALS — BP 136/78 | HR 60 | Temp 98.1°F | Ht 71.0 in | Wt 209.5 lb

## 2016-12-28 DIAGNOSIS — R21 Rash and other nonspecific skin eruption: Secondary | ICD-10-CM | POA: Diagnosis not present

## 2016-12-28 DIAGNOSIS — R5382 Chronic fatigue, unspecified: Secondary | ICD-10-CM | POA: Diagnosis not present

## 2016-12-28 DIAGNOSIS — I10 Essential (primary) hypertension: Secondary | ICD-10-CM

## 2016-12-28 DIAGNOSIS — M1612 Unilateral primary osteoarthritis, left hip: Secondary | ICD-10-CM

## 2016-12-28 DIAGNOSIS — Z87891 Personal history of nicotine dependence: Secondary | ICD-10-CM | POA: Diagnosis not present

## 2016-12-28 DIAGNOSIS — I2510 Atherosclerotic heart disease of native coronary artery without angina pectoris: Secondary | ICD-10-CM | POA: Diagnosis not present

## 2016-12-28 DIAGNOSIS — M5417 Radiculopathy, lumbosacral region: Secondary | ICD-10-CM

## 2016-12-28 DIAGNOSIS — J449 Chronic obstructive pulmonary disease, unspecified: Secondary | ICD-10-CM | POA: Diagnosis not present

## 2016-12-28 DIAGNOSIS — K225 Diverticulum of esophagus, acquired: Secondary | ICD-10-CM | POA: Diagnosis not present

## 2016-12-28 NOTE — Assessment & Plan Note (Signed)
Appreciate pulm care of patient. Continues breo daily.

## 2016-12-28 NOTE — Assessment & Plan Note (Signed)
Continue f/u with Dr Newton.  

## 2016-12-28 NOTE — Assessment & Plan Note (Signed)
Discussed lung cancer screening CT - he declines referral today and would like to review with Dr [name].

## 2016-12-28 NOTE — Patient Instructions (Addendum)
Talk with Dr Ardis Hughs about endoscopy when you go for your colonoscopy at the end of this year.  Talk with Dr [name] about lung cancer screening.  Handicap placard application provided today.  I will work on disability paperwork.  Return as needed or in 6 months for medicare wellness visit with Katha Cabal and physical with me.

## 2016-12-28 NOTE — Assessment & Plan Note (Signed)
Off thyroid replacement, off hormones and stable.

## 2016-12-28 NOTE — Assessment & Plan Note (Signed)
Appreciate cards care. 

## 2016-12-28 NOTE — Progress Notes (Signed)
BP 136/78   Pulse 60   Temp 98.1 F (36.7 C)   Ht 5\' 11"  (1.803 m)   Wt 209 lb 8 oz (95 kg)   SpO2 97%   BMI 29.22 kg/m    CC: 55mo f/u visit Subjective:    Patient ID: Carl Lee, male    DOB: Oct 22, 1955, 61 y.o.   MRN: [id number]  HPI: Carl Lee is a 61 y.o. male presenting on 12/28/2016 for Follow-up   See prior note for details. He was previously followed by Dr [name] for chronic fatigue, was on combination of thyroid and hormone replacement per her however he stopped following due to insurance coverage. We started following here - thyroid levels were normal so we stopped thyroid replacement. I advised I didn't prescribe hormones for fatigue so we stopped this as well. Plasma rich protein improved S1 radiculopathy.   Here for 6 mo f/u visit. Chest still hurts (chronic after CABG). Saw cardiology, note reviewed.   He now sees Dr Ernestina Patches for progressive L hip osteoarthritis and chronic pain syndrome. He has had arthrogram, had positive response to intraarticular hip injection. Pt not curretnly interested in hip replacement but if decides to proceed knows to f/u with orthopedic.   H/o hiccups for 4 days last week - affected sleep. Finally resolved on their own. H/o torn diaphragm from hiccups.   2 wk h/o thrush despite nystatin swish/swallow solution.  Pt endorses recent EGD within the past year - I don't have records of this.   Requests handicap placard today.  Looking at [employer], found he has coverage for disability. Placed on disability 01/23/2014.   Relevant past medical, surgical, family and social history reviewed and updated as indicated. Interim medical history since our last visit reviewed. Allergies and medications reviewed and updated. Outpatient Medications Prior to Visit  Medication Sig Dispense Refill  . albuterol (PROAIR HFA) 108 (90 BASE) MCG/ACT inhaler Inhale 2 puffs into the lungs every 6 (six) hours as needed for wheezing or shortness  of breath. 1 Inhaler 2  . amLODipine (NORVASC) 5 MG tablet Take 1 tablet (5 mg total) by mouth daily. 90 tablet 3  . aspirin EC 325 MG EC tablet Take 1 tablet (325 mg total) by mouth daily.    [name] atorvastatin (LIPITOR) 20 MG tablet TAKE 1 TABLET (20 MG TOTAL) BY MOUTH DAILY AT 6 PM. 90 tablet 3  . fluticasone furoate-vilanterol (BREO ELLIPTA) 100-25 MCG/INH AEPB Inhale 1 puff into the lungs daily. 60 each 6  . metoprolol tartrate (LOPRESSOR) 25 MG tablet Take 12.5 mg by mouth 2 (two) times daily.    . vitamin B-12 (CYANOCOBALAMIN) 500 MCG tablet Take 1 tablet (500 mcg total) by mouth daily.     Facility-Administered Medications Prior to Visit  Medication Dose Route Frequency Provider Last Rate Last Dose  . umeclidinium-vilanterol (ANORO ELLIPTA) 62.5-25 MCG/INH 1 puff  1 puff Inhalation Daily Simonne Maffucci B, MD         Per HPI unless specifically indicated in ROS section below Review of Systems     Objective:    BP 136/78   Pulse 60   Temp 98.1 F (36.7 C)   Ht 5\' 11"  (1.803 m)   Wt 209 lb 8 oz (95 kg)   SpO2 97%   BMI 29.22 kg/m   Wt Readings from Last 3 Encounters:  12/28/16 209 lb 8 oz (95 kg)  11/12/16 208 lb 6.4 oz (94.5 kg)  06/28/16 205 lb  9.6 oz (93.3 kg)    Physical Exam  Constitutional: He appears well-developed and well-nourished. No distress.  HENT:  Head: Normocephalic and atraumatic.  Mouth/Throat: Oropharynx is clear and moist. No oropharyngeal exudate.  No appreciable thrush on exam today  Eyes: Conjunctivae and EOM are normal. Pupils are equal, round, and reactive to light. No scleral icterus.  Neck: Normal range of motion. Neck supple. No thyromegaly present.  Cardiovascular: Normal rate, regular rhythm, normal heart sounds and intact distal pulses.   No murmur heard. Pulmonary/Chest: Effort normal and breath sounds normal. No respiratory distress. He has no wheezes. He has no rales.  Musculoskeletal: He exhibits no edema.  Lymphadenopathy:    He has  no cervical adenopathy.  Skin: Skin is warm and dry. No rash noted.  Chronic erosions throughout bilateral forearms in varying stages of healing Hardened nodule left forearm  Psychiatric: He has a normal mood and affect.  Nursing note and vitals reviewed.  Results for orders placed or performed in visit on 09/23/16  TSH  Result Value Ref Range   TSH 2.76 0.35 - 4.50 uIU/mL  T3  Result Value Ref Range   T3, Total 98.0 76 - 181 ng/dL  T4, free  Result Value Ref Range   Free T4 0.90 0.60 - 1.60 ng/dL  Testos,Total,Free and SHBG (Male)  Result Value Ref Range   Testosterone,Total,LC/MS/MS 259 250 - 1,100 ng/dL   Testosterone, Free 28.0 (L) 35.0 - 155.0 pg/mL   Sex Hormone Binding Glob. 38 22 - 77 nmol/L  Vitamin B12  Result Value Ref Range   Vitamin B-12 441 211 - 911 pg/mL      Assessment & Plan:   Problem List Items Addressed This Visit    CAD (coronary artery disease), native coronary artery    Appreciate cards care.       Chronic fatigue    Off thyroid replacement, off hormones and stable.       COPD, moderate ([place])    Appreciate pulm care of patient. Continues breo daily.      Ex-smoker    Discussed lung cancer screening CT - he declines referral today and would like to review with Dr [name].      Hypertension    Chronic, stable. Continue current regimen.       Lumbosacral radiculopathy at S1    Continue f/u with Dr Ernestina Patches.      Osteoarthritis of left hip - Primary    Chronic issue, followed by Dr Ernestina Patches. Appreciate his care. Pt not currently interested in surgery.       Skin rash    Dry scaly rash bilateral forearms anticipate actinic skin damage from prior sun exposure. rec moisturizing twice daily and if no better rec derm referral for further management.       Zenker's diverticulum    Ongoing trouble. No weight loss. He will touch base with GI about this at appt later this year when he's due for colonoscopy. Declines earlier intervention at this  time.           Follow up plan: Return in about 6 months (around 06/29/2017), or if symptoms worsen or fail to improve, for annual exam, prior fasting for blood work, medicare wellness visit.  [name], MD

## 2016-12-28 NOTE — Assessment & Plan Note (Signed)
Dry scaly rash bilateral forearms anticipate actinic skin damage from prior sun exposure. rec moisturizing twice daily and if no better rec derm referral for further management.

## 2016-12-28 NOTE — Assessment & Plan Note (Signed)
Chronic issue, followed by Dr Ernestina Patches. Appreciate his care. Pt not currently interested in surgery.

## 2016-12-28 NOTE — Assessment & Plan Note (Signed)
Ongoing trouble. No weight loss. He will touch base with GI about this at appt later this year when he's due for colonoscopy. Declines earlier intervention at this time.

## 2016-12-28 NOTE — Assessment & Plan Note (Signed)
Chronic, stable. Continue current regimen. 

## 2016-12-30 DIAGNOSIS — G4733 Obstructive sleep apnea (adult) (pediatric): Secondary | ICD-10-CM | POA: Diagnosis not present

## 2017-01-03 ENCOUNTER — Telehealth (INDEPENDENT_AMBULATORY_CARE_PROVIDER_SITE_OTHER): Payer: Self-pay | Admitting: Physical Medicine and Rehabilitation

## 2017-01-03 ENCOUNTER — Telehealth: Payer: Self-pay | Admitting: Pulmonary Disease

## 2017-01-03 MED ORDER — ALBUTEROL SULFATE HFA 108 (90 BASE) MCG/ACT IN AERS: 2.0000 | INHALATION_SPRAY | Freq: Four times a day (QID) | RESPIRATORY_TRACT | 2 refills | Status: DC | PRN

## 2017-01-03 NOTE — Telephone Encounter (Signed)
Pt request Rx for Proair to be sent to [facility] in [place]. Rx has been sent to preferred pharmacy. Nothing further needed.

## 2017-01-03 NOTE — Telephone Encounter (Signed)
See Dr. [name] for eval and mgt. Tell him he is our main hip ortho

## 2017-01-03 NOTE — Telephone Encounter (Signed)
Scheduled to see Dr. [name] as a consult on 01/11/17 at 1030.

## 2017-01-04 DIAGNOSIS — H5202 Hypermetropia, left eye: Secondary | ICD-10-CM | POA: Diagnosis not present

## 2017-01-04 DIAGNOSIS — H52223 Regular astigmatism, bilateral: Secondary | ICD-10-CM | POA: Diagnosis not present

## 2017-01-04 DIAGNOSIS — H524 Presbyopia: Secondary | ICD-10-CM | POA: Diagnosis not present

## 2017-01-04 DIAGNOSIS — H2513 Age-related nuclear cataract, bilateral: Secondary | ICD-10-CM | POA: Diagnosis not present

## 2017-01-04 LAB — HM DIABETES EYE EXAM

## 2017-01-06 ENCOUNTER — Telehealth: Payer: Self-pay | Admitting: *Deleted

## 2017-01-06 NOTE — Telephone Encounter (Signed)
-----   Message from Sueanne Margarita, MD sent at 01/01/2017  7:41 PM EDT ----- Good AHI and compliance.  Continue current PAP settings.

## 2017-01-06 NOTE — Telephone Encounter (Signed)
Informed patient of compliance results and he verbalized understanding. Patient understands his settings will not change. Patient was grateful for the call and thanked me.

## 2017-01-09 HISTORY — PX: FULGURATION OF BLADDER TUMOR: SHX6261

## 2017-01-10 ENCOUNTER — Telehealth: Payer: Self-pay | Admitting: Family Medicine

## 2017-01-10 NOTE — Telephone Encounter (Signed)
disability evaluation forms filled out and placed in my out box.

## 2017-01-11 ENCOUNTER — Ambulatory Visit (INDEPENDENT_AMBULATORY_CARE_PROVIDER_SITE_OTHER): Payer: PPO | Admitting: Orthopaedic Surgery

## 2017-01-11 ENCOUNTER — Ambulatory Visit (INDEPENDENT_AMBULATORY_CARE_PROVIDER_SITE_OTHER): Payer: PPO

## 2017-01-11 DIAGNOSIS — M25552 Pain in left hip: Secondary | ICD-10-CM

## 2017-01-11 DIAGNOSIS — M1612 Unilateral primary osteoarthritis, left hip: Secondary | ICD-10-CM | POA: Diagnosis not present

## 2017-01-11 NOTE — Progress Notes (Signed)
Office Visit Note   Patient: Carl Lee           Date of Birth: 1955/12/15           MRN: [id number] Visit Date: 01/11/2017              Requested by: [name], MD [address] [place], [place] [address] PCP: [name], MD   Assessment & Plan: Visit Diagnoses:  1. Pain in left hip   2. Unilateral primary osteoarthritis, left hip     Plan: At this point I do feel he is a candidate for a total hip arthroplasty. We talked about direct anterior hip surgery I gave him a handout about the surgery. All questions were encouraged and answered. We long and thorough discussion about the risks and benefits of the surgery and the other treatment options. He is exhausted all conservative treatment options at this point. Again the treatments been for over a year now. His pain is daily and is detrimentally affected his activities daily living, his quality of life, his mobility. We went over his x-rays in the hip replacement model and talked in detail what this involves. He would like to consider this surgery sometime after August of this year. I gave him our surgery scheduler's card to give [place] a call and I'm happy to answer any other questions between now and then if needed.  Follow-Up Instructions: Return if symptoms worsen or fail to improve.   Orders:  Orders Placed This Encounter  Procedures  . XR HIP UNILAT W OR W/O PELVIS 1V LEFT   No orders of the defined types were placed in this encounter.     Procedures: No procedures performed   Clinical Data: No additional findings.   Subjective: No chief complaint on file. Patient is someone referred from Dr. Ernestina Patches to further evaluate and treat his left hip. He has known severe arthritis in that left hip. Isn't bothering him with pain in the groin and with activities for many months now well over a year. He is tried an intra-articular injection in that helped from a pain standpoint for several days with the pain came  right back. It hurts with pivoting activities. At this point is 10 out of 10 on occasion. It is detrimentally affected his activities daily living, his quality of life, and his mobility. He is tried anti-inflammatories, rest, ice, heat, time, an exercise program, weight loss and an assistive device with a cane. Again his treatment is been going on for well over 3 months to a year try to get his hip to feel better.  HPI  Review of Systems He currently denies any headache, chest pain, shortness of breath, fever, chills, nausea, vomiting.  Objective: Vital Signs: There were no vitals taken for this visit.  Physical Exam He is alert and oriented 3 and in no acute distress Ortho Exam Examination of his right hip shows fluid range of motion with no pain with internal/external rotation flexion or extension. The left hip has severe pain with internal/external rotation and there is limitations in his rotation as well. His leg lengths are near equal. Specialty Comments:  No specialty comments available.  Imaging: Xr Hip Unilat W Or W/o Pelvis 1v Left  Result Date: 01/11/2017 An AP pelvis shows end-stage arthritis of the left hip. There is complete loss of superior lateral joint space. There is evidence of previous femoral acetabular impingement. There is particular osteophytes and sclerotic changes as well.  PMFS History: Patient Active Problem List   Diagnosis Date Noted  . Radiculopathy due to lumbar intervertebral disc disorder 10/21/2016  . Osteoarthritis of left hip 08/16/2016  . Atrial tachycardia, paroxysmal ([place]) 06/18/2016  . Chronic fatigue 04/27/2016  . CAD (coronary artery disease), native coronary artery   . Abdominal discomfort 06/24/2015  . Hepatic steatosis 06/12/2015  . Right trigeminal neuralgia 11/27/2014  . Chest pain 10/30/2014  . Vitamin B12 deficiency   . OSA (obstructive sleep apnea) 09/04/2014  . Headache 06/20/2014  . Prediabetes 06/08/2014  . Dysphagia  04/19/2014  . Zenker's diverticulum 04/19/2014  . COPD, moderate ([place]) 03/26/2014  . S/P CABG x 3 02/07/2014  . Occlusion and stenosis of vertebral artery without mention of cerebral infarction 11/12/2013  . Skin rash 04/17/2013  . Healthcare maintenance 01/09/2013  . Dyslipidemia 01/09/2013  . Lumbosacral radiculopathy at S1 09/14/2012  . Hx of migraines   . Arthritis   . History of repair of aneurysm of abdominal aorta using endovascular stent graft 05/01/2012  . Adenomatous polyps 03/24/2012  . Ex-smoker 03/24/2012  . Hypertension 03/24/2012  . Bladder cancer ([place]) 03/24/2012   Past Medical History:  Diagnosis Date  . AAA (abdominal aortic aneurysm) ([place]) 03/25/12   s/p endovascular repair  . Adenomatous polyp 06/2009  . Atrial tachycardia, paroxysmal ([place]) 06/18/2016  . Bladder cancer ([place]) 08/16/2008  . CAD (coronary artery disease), native coronary artery    S/p 3v CABG 01/2014   . Childhood asthma   . Contact dermatitis    atypical [name])  . COPD (chronic obstructive pulmonary disease) ([place])   . Coronary artery disease    a.2015: CABG w/ LIMA-LAD, RIMA-OM, SVG-RCA b. Cath on 07/10/15 w/ patent LIMA-LAD and RIMA-OM. SVG-RCA occluded but collaterals present  . Diverticulosis 2013   severe by CT and colonoscopy  . Environmental allergies    improved as ages  . Fracture of lateral malleolus of left ankle 08/29/2013  . Hepatic steatosis 06/2015   by [place]  . Hx of migraines   . Hypertension   . Lumbosacral radiculopathy at S1 03/2012   left, with spinal stenosis (MRI 04/2013) improved with TF ESI L5/S1 and S1/2 ([facility] [place])  . OSA (obstructive sleep apnea) 09/04/2014   Severe with AHI 35/hr  . Prediabetes 06/08/2014   CAD   . Resistance to clopidogrel 2015   drug metabolism panel run - asked to scan  . Spinal stenosis    LS1 nerve root impingement from bulging disc  . Vitamin B12 deficiency   . Zenker diverticulum     Family History  Problem Relation Age of  Onset  . Diabetes Mother   . Arrhythmia Mother        pacemaker  . Cancer Mother        Bladder  . COPD Mother   . Thyroid disease Mother   . Hyperlipidemia Mother   . Hypertension Mother   . Diabetes Father   . CAD Father 55       CHF, MI  . Dementia Father   . Heart attack Father   . Diabetes Sister   . Diabetes Brother   . Hypertension Brother   . Heart attack Maternal Grandmother   . Colon cancer Neg Hx     Past Surgical History:  Procedure Laterality Date  . ABDOMINAL AORTIC ANEURYSM REPAIR  03/25/12   endovascular  . ABI  05/2013   WNL, L TBI low at 0.66  . Bladder cancer  March 2010 and Oct.  2015   ([place]) x 2  . CARDIAC CATHETERIZATION N/A 07/10/2015   Procedure: Left Heart Cath and Cors/Grafts Angiography;  Surgeon: [name], MD;  Location: [facility];  Service: Cardiovascular;  Laterality: N/A;  . COLONOSCOPY  06/2012   hyperplastic polyp, diverticulosis (jacobs) rec rpt 5 yrs  . CORONARY ARTERY BYPASS GRAFT N/A 02/07/2014   Procedure: CORONARY ARTERY BYPASS GRAFTING (CABG);  Surgeon: [name], MD;  Location: [facility];  Service: Open Heart Surgery;  Laterality: N/A;  CABG X 3, BILATERAL LIMA, EVH  . CYSTOSCOPY  08/16/08   Bladder Cancer  . EPIDURAL BLOCK INJECTION Left 09/2014, 10/2014, 12/2014   medial L2,3,4, dorsal L5 ramus blocks x2, L L5/S1 and S1/2 transforaminal ESI ([facility] [place])  . ESI  04/2013, 06/2013   L L5S1, S12 transforaminal ESI (Dr.  [name])  . ESI Left 04/2014, 05/2014, 06/2014   L5/S1, S1/2; rpt; L4/5  . ESI  03/2016   R L5/S1 interlaminar ESI  . INTRAOPERATIVE TRANSESOPHAGEAL ECHOCARDIOGRAM N/A 02/07/2014   Procedure: INTRAOPERATIVE TRANSESOPHAGEAL ECHOCARDIOGRAM;  Surgeon: [name], MD;  Location: [facility];  Service: Open Heart Surgery;  Laterality: N/A;  . LEFT HEART CATHETERIZATION WITH CORONARY ANGIOGRAM N/A 02/01/2014   Procedure: LEFT HEART CATHETERIZATION WITH CORONARY ANGIOGRAM;  Surgeon:  [name], MD;  Location[facility] C[facility];  Service: Cardiovascular;  Laterality: N/A;  . PRP epidural injection Left 11/2015   L5/S1, S1/2 transforaminal epidural PRP injections under fluoroscopy ([place])  . TONSILLECTOMY AND ADENOIDECTOMY  1973  . VASECTOMY     Social History   Occupational History  . Self Employed    Social History Main Topics  . Smoking status: Former Smoker    Packs/day: 1.00    Years: 40.00    Types: Cigarettes    Quit date: 02/05/2014  . Smokeless tobacco: Never Used  . Alcohol use 0.0 oz/week     Comment: Rare- states 1 oz. per week  . Drug use: No  . Sexual activity: Not on file

## 2017-01-20 ENCOUNTER — Telehealth: Payer: Self-pay | Admitting: Family Medicine

## 2017-01-20 DIAGNOSIS — R21 Rash and other nonspecific skin eruption: Secondary | ICD-10-CM

## 2017-01-20 NOTE — Telephone Encounter (Signed)
Patient was seen by Dr.G in June.  Patient said Dr.G mentioned a referral to dermatology for place on his arm.  Patient said he hasn't heard anything about the referral.  Patient would like to go to [place], as long as they're in network with [employer].  Patient said he prefers mornings and he'll be out of town until 01/26/17.

## 2017-01-20 NOTE — Telephone Encounter (Signed)
Patient called to find out if his disability forms were done.  Please call patient back at [phone].  I let patient know Shirlean Mylar is on vacation until 01/24/17.

## 2017-01-20 NOTE — Telephone Encounter (Signed)
referral placed

## 2017-01-25 ENCOUNTER — Encounter: Payer: Self-pay | Admitting: *Deleted

## 2017-01-25 DIAGNOSIS — G4733 Obstructive sleep apnea (adult) (pediatric): Secondary | ICD-10-CM | POA: Diagnosis not present

## 2017-01-26 ENCOUNTER — Encounter: Payer: Self-pay | Admitting: Cardiology

## 2017-01-26 ENCOUNTER — Ambulatory Visit (INDEPENDENT_AMBULATORY_CARE_PROVIDER_SITE_OTHER): Payer: PPO | Admitting: Cardiology

## 2017-01-26 VITALS — BP 130/74 | HR 85 | Ht 71.0 in | Wt 206.8 lb

## 2017-01-26 DIAGNOSIS — I10 Essential (primary) hypertension: Secondary | ICD-10-CM | POA: Diagnosis not present

## 2017-01-26 DIAGNOSIS — E1101 Type 2 diabetes mellitus with hyperosmolarity with coma: Secondary | ICD-10-CM

## 2017-01-26 DIAGNOSIS — E663 Overweight: Secondary | ICD-10-CM

## 2017-01-26 DIAGNOSIS — N401 Enlarged prostate with lower urinary tract symptoms: Secondary | ICD-10-CM | POA: Diagnosis not present

## 2017-01-26 DIAGNOSIS — G4733 Obstructive sleep apnea (adult) (pediatric): Secondary | ICD-10-CM

## 2017-01-26 DIAGNOSIS — C672 Malignant neoplasm of lateral wall of bladder: Secondary | ICD-10-CM | POA: Diagnosis not present

## 2017-01-26 DIAGNOSIS — E669 Obesity, unspecified: Secondary | ICD-10-CM | POA: Insufficient documentation

## 2017-01-26 DIAGNOSIS — R079 Chest pain, unspecified: Secondary | ICD-10-CM

## 2017-01-26 DIAGNOSIS — R351 Nocturia: Secondary | ICD-10-CM | POA: Diagnosis not present

## 2017-01-26 HISTORY — DX: Overweight: E66.3

## 2017-01-26 NOTE — Patient Instructions (Signed)
Medication Instructions:  Your physician recommends that you continue on your current medications as directed. Please refer to the Current Medication list given to you today.   Labwork: None  Testing/Procedures: Your physician has requested that you have a lexiscan myoview. For further information please visit [email or web address]. Please follow instruction sheet, as given.  Follow-Up: Your physician recommends that you schedule a follow-up appointment with Dr. [name] ONE WEEK AFTER YOUR STRESS TEST.  Your physician wants you to follow-up in: 1 year with Dr. Radford Pax. You will receive a reminder letter in the mail two months in advance. If you don't receive a letter, please call our office to schedule the follow-up appointment.   Any Other Special Instructions Will Be Listed Below (If Applicable).     If you need a refill on your cardiac medications before your next appointment, please call your pharmacy.

## 2017-01-26 NOTE — Progress Notes (Signed)
Cardiology Office Note    Date:  01/26/2017   ID:  Carl Lee, DOB [birth year], MRN [id number]  PCP:  [name], MD  Cardiologist:  [name], MD   Chief Complaint  Patient presents with  . Sleep Apnea  . Hypertension    History of Present Illness:  Carl Lee is a 61 y.o. male with a history of HTN, severe OSA with an AHI of 35 events per hour with oxygen desaturations as low ast 81% now on CPAP. He is here today for followup and is doing well with his PAP device. He tolerates his memory foam full face mask and feels the pressure is adequate. His daytime sleepiness had improved after starting the PAP but still naps but still feels tired in the am.  He sleep well at night and does not think he snores with the PAP.  He has been having chest discomfort left sided occasionally for the past 3 weeks described as an ache that does not radiate.  It is not associated with SOB, nausea or diaphoresis.    Past Medical History:  Diagnosis Date  . AAA (abdominal aortic aneurysm) ([place]) 03/25/12   s/p endovascular repair  . Adenomatous polyp 06/2009  . Atrial tachycardia, paroxysmal ([place]) 06/18/2016  . Bladder cancer ([place]) 08/16/2008  . CAD (coronary artery disease), native coronary artery    S/p 3v CABG 01/2014   . Childhood asthma   . Contact dermatitis    atypical [name])  . COPD (chronic obstructive pulmonary disease) ([place])   . Coronary artery disease    a.2015: CABG w/ LIMA-LAD, RIMA-OM, SVG-RCA b. Cath on 07/10/15 w/ patent LIMA-LAD and RIMA-OM. SVG-RCA occluded but collaterals present  . Diverticulosis 2013   severe by CT and colonoscopy  . Environmental allergies    improved as ages  . Fracture of lateral malleolus of left ankle 08/29/2013  . Hepatic steatosis 06/2015   by [place]  . Hx of migraines   . Hypertension   . Lumbosacral radiculopathy at S1 03/2012   left, with spinal stenosis (MRI 04/2013) improved with TF ESI L5/S1 and S1/2 ([facility] [place])  . OSA  (obstructive sleep apnea) 09/04/2014   Severe with AHI 35/hr  . Overweight (BMI 25.0-29.9) 01/26/2017  . Prediabetes 06/08/2014   CAD   . Resistance to clopidogrel 2015   drug metabolism panel run - asked to scan  . Spinal stenosis    LS1 nerve root impingement from bulging disc  . Vitamin B12 deficiency   . Zenker diverticulum     Past Surgical History:  Procedure Laterality Date  . ABDOMINAL AORTIC ANEURYSM REPAIR  03/25/12   endovascular  . ABI  05/2013   WNL, L TBI low at 0.66  . Bladder cancer  March 2010 and Oct. 2015   [place]) x 2  . CARDIAC CATHETERIZATION N/A 07/10/2015   Procedure: Left Heart Cath and Cors/Grafts Angiography;  Surgeon: [name], MD;  Location: [facility];  Service: Cardiovascular;  Laterality: N/A;  . COLONOSCOPY  06/2012   hyperplastic polyp, diverticulosis (jacobs) rec rpt 5 yrs  . CORONARY ARTERY BYPASS GRAFT N/A 02/07/2014   Procedure: CORONARY ARTERY BYPASS GRAFTING (CABG);  Surgeon: [name], MD;  Location: [facility];  Service: Open Heart Surgery;  Laterality: N/A;  CABG X 3, BILATERAL LIMA, EVH  . CYSTOSCOPY  08/16/08   Bladder Cancer  . EPIDURAL BLOCK INJECTION Left 09/2014, 10/2014, 12/2014   medial L2,3,4, dorsal L5 ramus blocks x2,  L L5/S1 and S1/2 transforaminal ESI (D[facility]ton [place])  . ESI  04/2013, 06/2013   L L5S1, S12 transforaminal ESI (Dr.  [name])  . ESI Left 04/2014, 05/2014, 06/2014   L5/S1, S1/2; rpt; L4/5  . ESI  03/2016   R L5/S1 interlaminar ESI  . INTRAOPERATIVE TRANSESOPHAGEAL ECHOCARDIOGRAM N/A 02/07/2014   Procedure: INTRAOPERATIVE TRANSESOPHAGEAL ECHOCARDIOGRAM;  Surgeon: [name], MD;  Location: [facility];  Service: Open Heart Surgery;  Laterality: N/A;  . LEFT HEART CATHETERIZATION WITH CORONARY ANGIOGRAM N/A 02/01/2014   Procedure: LEFT HEART CATHETERIZATION WITH CORONARY ANGIOGRAM;  Surgeon: [name], MD;  Location: [facility] CATH LAB;  Service: Cardiovascular;  Laterality:  N/A;  . PRP epidural injection Left 11/2015   L5/S1, S1/2 transforaminal epidural PRP injections under fluoroscopy ([place])  . TONSILLECTOMY AND ADENOIDECTOMY  1973  . VASECTOMY      Current Medications: Current Meds  Medication Sig  . albuterol (PROAIR HFA) 108 (90 Base) MCG/ACT inhaler Inhale 2 puffs into the lungs every 6 (six) hours as needed for wheezing or shortness of breath.  [name] amLODipine (NORVASC) 5 MG tablet Take 1 tablet (5 mg total) by mouth daily.  [name] aspirin EC 325 MG EC tablet Take 1 tablet (325 mg total) by mouth daily.  [name] atorvastatin (LIPITOR) 20 MG tablet TAKE 1 TABLET (20 MG TOTAL) BY MOUTH DAILY AT 6 PM.  . Famotidine (PEPCID PO) Take 20 mg by mouth 2 (two) times daily.   . fluticasone furoate-vilanterol (BREO ELLIPTA) 100-25 MCG/INH AEPB Inhale 1 puff into the lungs daily.  . metoprolol tartrate (LOPRESSOR) 25 MG tablet Take 12.5 mg by mouth 2 (two) times daily.  . vitamin B-12 (CYANOCOBALAMIN) 500 MCG tablet Take 1 tablet (500 mcg total) by mouth daily.   Current Facility-Administered Medications for the 01/26/17 encounter (Office Visit) with Sueanne Margarita, MD  Medication  . umeclidinium-vilanterol (ANORO ELLIPTA) 62.5-25 MCG/INH 1 puff    Allergies:   Codeine and Doxycycline   Social History   Social History  . Marital status: Married    Spouse name: N/A  . Number of children: 2  . Years of education: N/A   Occupational History  . Self Employed    Social History Main Topics  . Smoking status: Former Smoker    Packs/day: 1.00    Years: 40.00    Types: Cigarettes    Quit date: 02/05/2014  . Smokeless tobacco: Never Used  . Alcohol use 0.0 oz/week     Comment: Rare- states 1 oz. per week  . Drug use: No  . Sexual activity: Not Asked   Other Topics Concern  . None   Social History Narrative   Caffeine: 1 cup coffee/day   Lives with wife, 1 dog   grown children   Occupation: Therapist, sports - self employed.  Disability after CABG     Edu: 2 yrs college   Activity: fishing, walking occasionally   Diet: good water, fruits/vegetables daily     Family History:  The patient's family history includes Arrhythmia in his mother; CAD (age of onset: 59) in his father; COPD in his mother; Cancer in his mother; Dementia in his father; Diabetes in his brother, father, mother, and sister; Heart attack in his father and maternal grandmother; Hyperlipidemia in his mother; Hypertension in his brother and mother; Thyroid disease in his mother.   ROS:   Please see the history of present illness.    ROS All other systems reviewed and are negative.  No  flowsheet data found.     PHYSICAL EXAM:   VS:  BP 130/74   Pulse 85   Ht 5\' 11"  (1.803 m)   Wt 206 lb 12.8 oz (93.8 kg)   SpO2 98%   BMI 28.84 kg/m    GEN: Well nourished, well developed, in no acute distress  HEENT: normal  Neck: no JVD, carotid bruits, or masses Cardiac: RRR; no murmurs, rubs, or gallops,no edema.  Intact distal pulses bilaterally.  Respiratory:  clear to auscultation bilaterally, normal work of breathing GI: soft, nontender, nondistended, + BS MS: no deformity or atrophy  Skin: warm and dry, no rash Neuro:  Alert and Oriented x 3, Strength and sensation are intact Psych: euthymic mood, full affect  Wt Readings from Last 3 Encounters:  01/26/17 206 lb 12.8 oz (93.8 kg)  12/28/16 209 lb 8 oz (95 kg)  11/12/16 208 lb 6.4 oz (94.5 kg)      Studies/Labs Reviewed:   EKG:  EKG is not ordered today.   Recent Labs: 03/19/2016: Hemoglobin 15.7; Platelets 238 06/24/2016: ALT 7; BUN 20; Creatinine, Ser 0.81; Potassium 4.7; Sodium 141 09/23/2016: TSH 2.76   Lipid Panel    Component Value Date/Time   CHOL 99 06/24/2016 0843   CHOL 170 01/05/2012   TRIG 93.0 06/24/2016 0843   TRIG 141 01/05/2012   HDL 40.30 06/24/2016 0843   CHOLHDL 2 06/24/2016 0843   VLDL 18.6 06/24/2016 0843   LDLCALC 40 06/24/2016 0843   LDLDIRECT 105 01/05/2012    Additional  studies/ records that were reviewed today include:  CPAP download    ASSESSMENT:    1. OSA (obstructive sleep apnea)   2. Essential hypertension   3. Overweight (BMI 25.0-29.9)   4. Type 2 diabetes mellitus with hyperosmolar coma, without long-term current use of insulin (HCC)   5. Chest pain, unspecified type      PLAN:  In order of problems listed above:  OSA - the patient is tolerating PAP therapy well without any problems. The PAP download was reviewed today and showed an AHI of 4.1/hr on 16/12 cm H2O with 93% compliance in using more than 4 hours nightly.  The patient has been using and benefiting from CPAP use and will continue to benefit from therapy.   2.  HTN - his BP is well controlled on exam today.  He will continue on amlodipine 5mg  daily and lopressor 12.5mg  BID  3.  Overweight - I have encouraged him to get into a routine exercise program and cut back on carbs and portions.   4.  DM type 2  Uncomplicated - continue to followup with PCP  5.  Chest pain ? Etiology - he has a history of Zencker's diverticulum and he is unsure whether the CP is due to GERD or is heart.  He never had angina with his CAD in the past.   I will get a lexiscan myoview to rule out ischemia (he cannot walk due to hip pain).  I will set up followup with his Cardiologist, Dr. [name], after the study.  He also is planning to get a hip repair.     Medication Adjustments/Labs and Tests Ordered: Current medicines are reviewed at length with the patient today.  Concerns regarding medicines are outlined above.  Medication changes, Labs and Tests ordered today are listed in the Patient Instructions below.  There are no Patient Instructions on file for this visit.   Signed, Fransico Him, MD  01/26/2017 3:10 PM  [facility] [place], [place], [place]  [address] Phone: [phone]; Fax: [phone]

## 2017-01-26 NOTE — Telephone Encounter (Signed)
Pt called wanting disposition of disability forms.    Disability paperwork at [facility] desk, completed information, made copies, called pt, he will pick up original and copy and would like to get sent out himself.  Copy made and sent for scanning.

## 2017-01-27 ENCOUNTER — Telehealth (INDEPENDENT_AMBULATORY_CARE_PROVIDER_SITE_OTHER): Payer: Self-pay | Admitting: Orthopaedic Surgery

## 2017-01-27 ENCOUNTER — Telehealth (HOSPITAL_COMMUNITY): Payer: Self-pay | Admitting: *Deleted

## 2017-01-27 NOTE — Telephone Encounter (Signed)
Called patient and the only number listed has been disconnected. Sent him a Pharmacist, community message to call Rosaria Ferries about the Derm Referral.

## 2017-01-27 NOTE — Telephone Encounter (Signed)
Patient given detailed instructions per Myocardial Perfusion Study Information Sheet for the test on 01/28/17 at 7:15. Patient notified to arrive 15 minutes early and that it is imperative to arrive on time for appointment to keep from having the test rescheduled.  If you need to cancel or reschedule your appointment, please call the office within 24 hours of your appointment. . Patient verbalized understanding.Carl Lee

## 2017-01-27 NOTE — Telephone Encounter (Signed)
PT HAS A FEW QUESTIONS REGARDING SURGERY, PLEASE GIVE HIM A CALL WHEN YOU CAN.  [phone]

## 2017-01-28 ENCOUNTER — Encounter: Payer: Self-pay | Admitting: Family Medicine

## 2017-01-28 ENCOUNTER — Ambulatory Visit (HOSPITAL_COMMUNITY): Payer: PPO | Attending: Cardiovascular Disease

## 2017-01-28 DIAGNOSIS — I10 Essential (primary) hypertension: Secondary | ICD-10-CM | POA: Insufficient documentation

## 2017-01-28 DIAGNOSIS — Z01818 Encounter for other preprocedural examination: Secondary | ICD-10-CM | POA: Diagnosis not present

## 2017-01-28 DIAGNOSIS — I2510 Atherosclerotic heart disease of native coronary artery without angina pectoris: Secondary | ICD-10-CM | POA: Insufficient documentation

## 2017-01-28 DIAGNOSIS — E119 Type 2 diabetes mellitus without complications: Secondary | ICD-10-CM | POA: Diagnosis not present

## 2017-01-28 DIAGNOSIS — Z951 Presence of aortocoronary bypass graft: Secondary | ICD-10-CM | POA: Diagnosis not present

## 2017-01-28 DIAGNOSIS — R079 Chest pain, unspecified: Secondary | ICD-10-CM | POA: Diagnosis not present

## 2017-01-28 LAB — MYOCARDIAL PERFUSION IMAGING
CHL CUP NUCLEAR SDS: 6
CHL CUP NUCLEAR SRS: 5
CHL CUP RESTING HR STRESS: 47 {beats}/min
LHR: 0.26
LV dias vol: 107 mL (ref 62–150)
LV sys vol: 44 mL
NUC STRESS TID: 0.93
Peak HR: 84 {beats}/min
SSS: 11

## 2017-01-28 MED ORDER — TECHNETIUM TC 99M TETROFOSMIN IV KIT
10.1000 | PACK | Freq: Once | INTRAVENOUS | Status: AC | PRN
  Administered 2017-01-28: 10.1 via INTRAVENOUS
  Filled 2017-01-28: qty 11

## 2017-01-28 MED ORDER — REGADENOSON 0.4 MG/5ML IV SOLN
0.4000 mg | Freq: Once | INTRAVENOUS | Status: AC
  Administered 2017-01-28: 0.4 mg via INTRAVENOUS

## 2017-01-28 MED ORDER — TECHNETIUM TC 99M TETROFOSMIN IV KIT
32.5000 | PACK | Freq: Once | INTRAVENOUS | Status: AC | PRN
  Administered 2017-01-28: 32.5 via INTRAVENOUS
  Filled 2017-01-28: qty 33

## 2017-01-31 NOTE — Telephone Encounter (Signed)
Spoke with patient about scheduling THA.  He has another cardiology test to be done before he is cleared.  He said you mentioned something about pain meds.  Can you Rx something that does not have Codeine?  [facility] uses [facility] [place]. [phone]

## 2017-02-01 ENCOUNTER — Other Ambulatory Visit (INDEPENDENT_AMBULATORY_CARE_PROVIDER_SITE_OTHER): Payer: Self-pay

## 2017-02-01 ENCOUNTER — Telehealth: Payer: Self-pay

## 2017-02-01 DIAGNOSIS — R9439 Abnormal result of other cardiovascular function study: Secondary | ICD-10-CM

## 2017-02-01 DIAGNOSIS — I2510 Atherosclerotic heart disease of native coronary artery without angina pectoris: Secondary | ICD-10-CM

## 2017-02-01 MED ORDER — TRAMADOL HCL 50 MG PO TABS: 50.0000 mg | ORAL_TABLET | Freq: Three times a day (TID) | ORAL | 0 refills | Status: DC | PRN

## 2017-02-01 NOTE — Telephone Encounter (Signed)
Please advise 

## 2017-02-01 NOTE — Telephone Encounter (Signed)
Called into pharmacy

## 2017-02-01 NOTE — Telephone Encounter (Signed)
-----   Message from Sueanne Margarita, MD sent at 01/29/2017  4:30 PM EDT ----- Nuclear stress test likely low risk but multipl defects in the mid and apical and septal walls on rest and stress ? Etiology - please get coronary CTA with morphology and calcium score

## 2017-02-01 NOTE — Telephone Encounter (Signed)
Informed patient of results and verbal understanding expressed.   Coronary CT ordered for scheduling. Patient agrees with treatment plan. 

## 2017-02-01 NOTE — Telephone Encounter (Signed)
Try calling in tramadol 50 mg 1-2 every 8 hours as needed for pain #60 no refills.

## 2017-02-04 ENCOUNTER — Encounter: Payer: Self-pay | Admitting: Cardiology

## 2017-02-07 ENCOUNTER — Ambulatory Visit: Payer: PPO | Admitting: Nurse Practitioner

## 2017-02-07 DIAGNOSIS — C672 Malignant neoplasm of lateral wall of bladder: Secondary | ICD-10-CM | POA: Diagnosis not present

## 2017-02-08 ENCOUNTER — Other Ambulatory Visit: Payer: PPO

## 2017-02-08 ENCOUNTER — Telehealth (INDEPENDENT_AMBULATORY_CARE_PROVIDER_SITE_OTHER): Payer: Self-pay | Admitting: Orthopaedic Surgery

## 2017-02-08 DIAGNOSIS — R9439 Abnormal result of other cardiovascular function study: Secondary | ICD-10-CM

## 2017-02-08 DIAGNOSIS — I2510 Atherosclerotic heart disease of native coronary artery without angina pectoris: Secondary | ICD-10-CM | POA: Diagnosis not present

## 2017-02-08 LAB — BASIC METABOLIC PANEL
BUN/Creatinine Ratio: 16 (ref 10–24)
BUN: 13 mg/dL (ref 8–27)
CHLORIDE: 103 mmol/L (ref 96–106)
CO2: 23 mmol/L (ref 20–29)
Calcium: 9.2 mg/dL (ref 8.6–10.2)
Creatinine, Ser: 0.8 mg/dL (ref 0.76–1.27)
GFR calc Af Amer: 111 mL/min/{1.73_m2} (ref >59)
GFR, EST NON AFRICAN AMERICAN: 96 mL/min/{1.73_m2} (ref >59)
GLUCOSE: 94 mg/dL (ref 65–99)
POTASSIUM: 4.7 mmol/L (ref 3.5–5.2)
Sodium: 143 mmol/L (ref 134–144)

## 2017-02-08 NOTE — Telephone Encounter (Signed)
The only thing we prescribed for hip pain arthritis is either an occasional tramadol or Tylenol No. 3. Whichever he decides that he would like he can have 1-2 every 8 hours as needed for pain #60 no refills thanks

## 2017-02-08 NOTE — Telephone Encounter (Signed)
Please advise 

## 2017-02-08 NOTE — Telephone Encounter (Signed)
PT ASKED IF THERE IS ANYTHING HE CAN HAVE PRIOR TO SURGERY TO RELIEVE PAIN.  [phone]

## 2017-02-09 ENCOUNTER — Encounter: Payer: Self-pay | Admitting: Family Medicine

## 2017-02-09 NOTE — Telephone Encounter (Signed)
Patient getting a heart cath soon will call us to schedule surgery after cleared

## 2017-02-09 NOTE — Addendum Note (Signed)
Addended by: Aris Georgia, Kamali Sakata L on: 02/09/2017 11:33 AM   Modules accepted: Orders

## 2017-02-09 NOTE — Addendum Note (Signed)
Addended by: Aris Georgia, Udell Blasingame L on: 02/09/2017 12:13 PM   Modules accepted: Orders

## 2017-02-09 NOTE — Telephone Encounter (Addendum)
Per Dr. [name], patient does not need a CT, and he wants him to have a heart cath instead.   Patient has Heart Cath scheduled for 02/11/17 with Dr. Claiborne Billings. Patient will come in tomorrow for lab work and pick up instruction letter. Went over instructions with patient and patient verbalized understanding.

## 2017-02-10 ENCOUNTER — Telehealth: Payer: Self-pay

## 2017-02-10 ENCOUNTER — Ambulatory Visit (HOSPITAL_COMMUNITY): Payer: PPO

## 2017-02-10 ENCOUNTER — Other Ambulatory Visit: Payer: PPO | Admitting: *Deleted

## 2017-02-10 DIAGNOSIS — R9439 Abnormal result of other cardiovascular function study: Secondary | ICD-10-CM | POA: Diagnosis not present

## 2017-02-10 DIAGNOSIS — I2510 Atherosclerotic heart disease of native coronary artery without angina pectoris: Secondary | ICD-10-CM | POA: Diagnosis not present

## 2017-02-10 LAB — CBC WITH DIFFERENTIAL/PLATELET
BASOS ABS: 0.1 10*3/uL (ref 0.0–0.2)
Basos: 1 %
EOS (ABSOLUTE): 0.4 10*3/uL (ref 0.0–0.4)
Eos: 4 %
Hematocrit: 46.7 % (ref 37.5–51.0)
Hemoglobin: 16.1 g/dL (ref 13.0–17.7)
IMMATURE GRANS (ABS): 0.1 10*3/uL (ref 0.0–0.1)
IMMATURE GRANULOCYTES: 1 %
LYMPHS: 29 %
Lymphocytes Absolute: 2.6 10*3/uL (ref 0.7–3.1)
MCH: 31.4 pg (ref 26.6–33.0)
MCHC: 34.5 g/dL (ref 31.5–35.7)
MCV: 91 fL (ref 79–97)
Monocytes Absolute: 1 10*3/uL — ABNORMAL HIGH (ref 0.1–0.9)
Monocytes: 11 %
NEUTROS PCT: 54 %
Neutrophils Absolute: 4.7 10*3/uL (ref 1.4–7.0)
PLATELETS: 291 10*3/uL (ref 150–379)
RBC: 5.12 x10E6/uL (ref 4.14–5.80)
RDW: 13.6 % (ref 12.3–15.4)
WBC: 8.8 10*3/uL (ref 3.4–10.8)

## 2017-02-10 LAB — PROTIME-INR
INR: 1.1 (ref 0.8–1.2)
Prothrombin Time: 10.9 s (ref 9.1–12.0)

## 2017-02-10 NOTE — Telephone Encounter (Signed)
Patient contacted pre-catheterization at [facility] scheduled for:  02/11/2017 @ 1200 Verified arrival time and place:  NT @ 1000 Confirmed AM meds to be taken pre-cath with sip of water: Notified to take ASA 81 mg prior to arrival.  Confirmed patient has responsible person to drive home post procedure and observe patient for 24 hours:  Yes  Addl concerns:  Per Pt, his last catheterization caused him to be very sick.  States as soon as he started receiving fluids in short stay a headache started.  By the time the procedure was over, he had a really bad headache with N/V and BP's were running "really high".  Per Pt he had to be hospitalized after his last cath because of these symptoms.  Notified I would let Dr. Claiborne Billings know and asked Pt to also let Dr. Claiborne Billings know when he meets him.  Will send message to Dr. Claiborne Billings.

## 2017-02-11 ENCOUNTER — Ambulatory Visit (HOSPITAL_COMMUNITY)
Admission: RE | Admit: 2017-02-11 | Discharge: 2017-02-11 | Disposition: A | Discharge status: Home / Self Care | Payer: PPO | Source: Ambulatory Visit | Attending: Cardiovascular Disease | Admitting: Cardiovascular Disease

## 2017-02-11 ENCOUNTER — Encounter (HOSPITAL_COMMUNITY)
Admission: RE | Disposition: A | Discharge status: Home / Self Care | Payer: Self-pay | Source: Ambulatory Visit | Attending: Cardiovascular Disease

## 2017-02-11 DIAGNOSIS — Z79899 Other long term (current) drug therapy: Secondary | ICD-10-CM | POA: Diagnosis not present

## 2017-02-11 DIAGNOSIS — Z7982 Long term (current) use of aspirin: Secondary | ICD-10-CM | POA: Insufficient documentation

## 2017-02-11 DIAGNOSIS — Z6825 Body mass index (BMI) 25.0-25.9, adult: Secondary | ICD-10-CM | POA: Insufficient documentation

## 2017-02-11 DIAGNOSIS — G4733 Obstructive sleep apnea (adult) (pediatric): Secondary | ICD-10-CM | POA: Insufficient documentation

## 2017-02-11 DIAGNOSIS — E119 Type 2 diabetes mellitus without complications: Secondary | ICD-10-CM | POA: Insufficient documentation

## 2017-02-11 DIAGNOSIS — I2582 Chronic total occlusion of coronary artery: Secondary | ICD-10-CM | POA: Insufficient documentation

## 2017-02-11 DIAGNOSIS — I25708 Atherosclerosis of coronary artery bypass graft(s), unspecified, with other forms of angina pectoris: Secondary | ICD-10-CM | POA: Diagnosis not present

## 2017-02-11 DIAGNOSIS — I25718 Atherosclerosis of autologous vein coronary artery bypass graft(s) with other forms of angina pectoris: Secondary | ICD-10-CM | POA: Diagnosis not present

## 2017-02-11 DIAGNOSIS — E663 Overweight: Secondary | ICD-10-CM | POA: Diagnosis not present

## 2017-02-11 DIAGNOSIS — R9439 Abnormal result of other cardiovascular function study: Secondary | ICD-10-CM

## 2017-02-11 DIAGNOSIS — G4730 Sleep apnea, unspecified: Secondary | ICD-10-CM | POA: Insufficient documentation

## 2017-02-11 DIAGNOSIS — Z87891 Personal history of nicotine dependence: Secondary | ICD-10-CM | POA: Insufficient documentation

## 2017-02-11 DIAGNOSIS — I25118 Atherosclerotic heart disease of native coronary artery with other forms of angina pectoris: Secondary | ICD-10-CM | POA: Diagnosis not present

## 2017-02-11 DIAGNOSIS — I10 Essential (primary) hypertension: Secondary | ICD-10-CM | POA: Diagnosis not present

## 2017-02-11 DIAGNOSIS — Z8551 Personal history of malignant neoplasm of bladder: Secondary | ICD-10-CM | POA: Diagnosis not present

## 2017-02-11 HISTORY — PX: LEFT HEART CATH AND CORS/GRAFTS ANGIOGRAPHY: CATH118250

## 2017-02-11 SURGERY — LEFT HEART CATH AND CORS/GRAFTS ANGIOGRAPHY
Anesthesia: LOCAL

## 2017-02-11 MED ORDER — SODIUM CHLORIDE 0.9% FLUSH: 3.0000 mL | Freq: Two times a day (BID) | INTRAVENOUS | Status: DC

## 2017-02-11 MED ORDER — ONDANSETRON HCL 4 MG/2ML IJ SOLN: 4.0000 mg | Freq: Four times a day (QID) | INTRAMUSCULAR | Status: DC | PRN

## 2017-02-11 MED ORDER — MIDAZOLAM HCL 2 MG/2ML IJ SOLN
INTRAMUSCULAR | Status: DC | PRN
  Administered 2017-02-11: 2 mg via INTRAVENOUS

## 2017-02-11 MED ORDER — ASPIRIN 81 MG PO CHEW
81.0000 mg | CHEWABLE_TABLET | Freq: Every day | ORAL | Status: DC
  Administered 2017-02-11: 81 mg via ORAL

## 2017-02-11 MED ORDER — IOPAMIDOL (ISOVUE-370) INJECTION 76%
INTRAVENOUS | Status: AC
  Filled 2017-02-11: qty 50

## 2017-02-11 MED ORDER — FENTANYL CITRATE (PF) 100 MCG/2ML IJ SOLN
INTRAMUSCULAR | Status: DC | PRN
  Administered 2017-02-11: 50 ug via INTRAVENOUS

## 2017-02-11 MED ORDER — DIAZEPAM 5 MG PO TABS: 5.0000 mg | ORAL_TABLET | ORAL | Status: DC | PRN

## 2017-02-11 MED ORDER — LIDOCAINE HCL (PF) 1 % IJ SOLN
INTRAMUSCULAR | Status: DC | PRN
  Administered 2017-02-11: 15 mL via SUBCUTANEOUS

## 2017-02-11 MED ORDER — SODIUM CHLORIDE 0.9% FLUSH: 3.0000 mL | INTRAVENOUS | Status: DC | PRN

## 2017-02-11 MED ORDER — HEPARIN (PORCINE) IN NACL 2-0.9 UNIT/ML-% IJ SOLN
INTRAMUSCULAR | Status: AC | PRN
  Administered 2017-02-11: 1000 mL

## 2017-02-11 MED ORDER — SODIUM CHLORIDE 0.9 % WEIGHT BASED INFUSION
1.0000 mL/kg/h | INTRAVENOUS | Status: DC
Start: 2017-02-11 — End: 2017-02-11

## 2017-02-11 MED ORDER — HEPARIN (PORCINE) IN NACL 2-0.9 UNIT/ML-% IJ SOLN
INTRAMUSCULAR | Status: AC
  Filled 2017-02-11: qty 1000

## 2017-02-11 MED ORDER — SODIUM CHLORIDE 0.9 % WEIGHT BASED INFUSION
3.0000 mL/kg/h | INTRAVENOUS | Status: AC
  Administered 2017-02-11: 3 mL/kg/h via INTRAVENOUS

## 2017-02-11 MED ORDER — SODIUM CHLORIDE 0.9 % IV SOLN: 250.0000 mL | INTRAVENOUS | Status: DC | PRN

## 2017-02-11 MED ORDER — ASPIRIN 81 MG PO CHEW
CHEWABLE_TABLET | ORAL | Status: AC
  Filled 2017-02-11: qty 1

## 2017-02-11 MED ORDER — ACETAMINOPHEN 325 MG PO TABS
650.0000 mg | ORAL_TABLET | ORAL | Status: DC | PRN
  Administered 2017-02-11: 650 mg via ORAL

## 2017-02-11 MED ORDER — ACETAMINOPHEN 325 MG PO TABS
ORAL_TABLET | ORAL | Status: AC
  Filled 2017-02-11: qty 2

## 2017-02-11 MED ORDER — FENTANYL CITRATE (PF) 100 MCG/2ML IJ SOLN
INTRAMUSCULAR | Status: AC
  Filled 2017-02-11: qty 2

## 2017-02-11 MED ORDER — SODIUM CHLORIDE 0.9 % IV SOLN
INTRAVENOUS | Status: DC
  Administered 2017-02-11: 16:00:00 via INTRAVENOUS

## 2017-02-11 MED ORDER — LIDOCAINE HCL (PF) 1 % IJ SOLN
INTRAMUSCULAR | Status: AC
  Filled 2017-02-11: qty 30

## 2017-02-11 MED ORDER — IOPAMIDOL (ISOVUE-370) INJECTION 76%
INTRAVENOUS | Status: DC | PRN
  Administered 2017-02-11: 150 mL via INTRA_ARTERIAL

## 2017-02-11 MED ORDER — MIDAZOLAM HCL 2 MG/2ML IJ SOLN
INTRAMUSCULAR | Status: AC
  Filled 2017-02-11: qty 2

## 2017-02-11 MED ORDER — IOPAMIDOL (ISOVUE-370) INJECTION 76%
INTRAVENOUS | Status: AC
  Filled 2017-02-11: qty 100

## 2017-02-11 MED ORDER — ATORVASTATIN CALCIUM 80 MG PO TABS: 80.0000 mg | ORAL_TABLET | Freq: Every day | ORAL | Status: DC

## 2017-02-11 MED ORDER — ASPIRIN 81 MG PO CHEW: 81.0000 mg | CHEWABLE_TABLET | ORAL | Status: DC

## 2017-02-11 SURGICAL SUPPLY — 13 items
CATH INFINITI 5 FR IM (CATHETERS) ×2 IMPLANT
CATH INFINITI 5FR MULTPACK ANG (CATHETERS) ×2 IMPLANT
COVER PRB 48X5XTLSCP FOLD TPE (BAG) ×1 IMPLANT
COVER PROBE 5X48 (BAG) ×1
KIT HEART LEFT (KITS) ×2 IMPLANT
PACK CARDIAC CATHETERIZATION (CUSTOM PROCEDURE TRAY) ×2 IMPLANT
SHEATH PINNACLE 5F 10CM (SHEATH) ×2 IMPLANT
SYR MEDRAD MARK V 150ML (SYRINGE) ×2 IMPLANT
TRANSDUCER W/STOPCOCK (MISCELLANEOUS) ×2 IMPLANT
TUBING CIL FLEX 10 FLL-RA (TUBING) ×2 IMPLANT
WIRE EMERALD 3MM-J .025X260CM (WIRE) ×2 IMPLANT
WIRE EMERALD 3MM-J .035X150CM (WIRE) ×2 IMPLANT
WIRE HI TORQ VERSACORE-J 145CM (WIRE) ×2 IMPLANT

## 2017-02-11 NOTE — H&P (View-Only) (Signed)
Cardiology Office Note    Date:  01/26/2017   ID:  CYRUSS ARATA, DOB 1956-01-20, MRN [id number]  PCP:  [name], MD  Cardiologist:  [name], MD   Chief Complaint  Patient presents with  . Sleep Apnea  . Hypertension    History of Present Illness:  Carl Lee is a 60 y.o. male with a history of HTN, severe OSA with an AHI of 35 events per hour with oxygen desaturations as low ast 81% now on CPAP. He is here today for followup and is doing well with his PAP device. He tolerates his memory foam full face mask and feels the pressure is adequate. His daytime sleepiness had improved after starting the PAP but still naps but still feels tired in the am.  He sleep well at night and does not think he snores with the PAP.  He has been having chest discomfort left sided occasionally for the past 3 weeks described as an ache that does not radiate.  It is not associated with SOB, nausea or diaphoresis.    Past Medical History:  Diagnosis Date  . AAA (abdominal aortic aneurysm) ([place]) 03/25/12   s/p endovascular repair  . Adenomatous polyp 06/2009  . Atrial tachycardia, paroxysmal ([place]) 06/18/2016  . Bladder cancer ([place]) 08/16/2008  . CAD (coronary artery disease), native coronary artery    S/p 3v CABG 01/2014   . Childhood asthma   . Contact dermatitis    atypical [name])  . COPD (chronic obstructive pulmonary disease) ([place])   . Coronary artery disease    a.2015: CABG w/ LIMA-LAD, RIMA-OM, SVG-RCA b. Cath on 07/10/15 w/ patent LIMA-LAD and RIMA-OM. SVG-RCA occluded but collaterals present  . Diverticulosis 2013   severe by CT and colonoscopy  . Environmental allergies    improved as ages  . Fracture of lateral malleolus of left ankle 08/29/2013  . Hepatic steatosis 06/2015   by [place]  . Hx of migraines   . Hypertension   . Lumbosacral radiculopathy at S1 03/2012   left, with spinal stenosis (MRI 04/2013) improved with TF ESI L5/S1 and S1/2 (Dalton [place])  . OSA  (obstructive sleep apnea) 09/04/2014   Severe with AHI 35/hr  . Overweight (BMI 25.0-29.9) 01/26/2017  . Prediabetes 06/08/2014   CAD   . Resistance to clopidogrel 2015   drug metabolism panel run - asked to scan  . Spinal stenosis    LS1 nerve root impingement from bulging disc  . Vitamin B12 deficiency   . Zenker diverticulum     Past Surgical History:  Procedure Laterality Date  . ABDOMINAL AORTIC ANEURYSM REPAIR  03/25/12   endovascular  . ABI  05/2013   WNL, L TBI low at 0.66  . Bladder cancer  March 2010 and Oct. 2015   Ernst Spell) x 2  . CARDIAC CATHETERIZATION N/A 07/10/2015   Procedure: Left Heart Cath and Cors/Grafts Angiography;  Surgeon: [name], MD;  Location: [facility];  Service: Cardiovascular;  Laterality: N/A;  . COLONOSCOPY  06/2012   hyperplastic polyp, diverticulosis ([place]) rec rpt 5 yrs  . CORONARY ARTERY BYPASS GRAFT N/A 02/07/2014   Procedure: CORONARY ARTERY BYPASS GRAFTING (CABG);  Surgeon: [name], MD;  Location: [facility];  Service: Open Heart Surgery;  Laterality: N/A;  CABG X 3, BILATERAL LIMA, EVH  . CYSTOSCOPY  08/16/08   Bladder Cancer  . EPIDURAL BLOCK INJECTION Left 09/2014, 10/2014, 12/2014   medial L2,3,4, dorsal L5 ramus blocks x2,  L L5/S1 and S1/2 transforaminal ESI ([facility] [place])  . ESI  04/2013, 06/2013   L L5S1, S12 transforaminal ESI (Dr.  [name])  . ESI Left 04/2014, 05/2014, 06/2014   L5/S1, S1/2; rpt; L4/5  . ESI  03/2016   R L5/S1 interlaminar ESI  . INTRAOPERATIVE TRANSESOPHAGEAL ECHOCARDIOGRAM N/A 02/07/2014   Procedure: INTRAOPERATIVE TRANSESOPHAGEAL ECHOCARDIOGRAM;  Surgeon: [name], MD;  Location: [facility];  Service: Open Heart Surgery;  Laterality: N/A;  . LEFT HEART CATHETERIZATION WITH CORONARY ANGIOGRAM N/A 02/01/2014   Procedure: LEFT HEART CATHETERIZATION WITH CORONARY ANGIOGRAM;  Surgeon: [name], MD;  Location: [facility];  Service: Cardiovascular;  Laterality:  N/A;  . PRP epidural injection Left 11/2015   L5/S1, S1/2 transforaminal epidural PRP injections under fluoroscopy ([place])  . TONSILLECTOMY AND ADENOIDECTOMY  1973  . VASECTOMY      Current Medications: Current Meds  Medication Sig  . albuterol (PROAIR HFA) 108 (90 Base) MCG/ACT inhaler Inhale 2 puffs into the lungs every 6 (six) hours as needed for wheezing or shortness of breath.  [name] amLODipine (NORVASC) 5 MG tablet Take 1 tablet (5 mg total) by mouth daily.  [name] aspirin EC 325 MG EC tablet Take 1 tablet (325 mg total) by mouth daily.  [name] atorvastatin (LIPITOR) 20 MG tablet TAKE 1 TABLET (20 MG TOTAL) BY MOUTH DAILY AT 6 PM.  . Famotidine (PEPCID PO) Take 20 mg by mouth 2 (two) times daily.   . fluticasone furoate-vilanterol (BREO ELLIPTA) 100-25 MCG/INH AEPB Inhale 1 puff into the lungs daily.  . metoprolol tartrate (LOPRESSOR) 25 MG tablet Take 12.5 mg by mouth 2 (two) times daily.  . vitamin B-12 (CYANOCOBALAMIN) 500 MCG tablet Take 1 tablet (500 mcg total) by mouth daily.   Current Facility-Administered Medications for the 01/26/17 encounter (Office Visit) with Sueanne Margarita, MD  Medication  . umeclidinium-vilanterol (ANORO ELLIPTA) 62.5-25 MCG/INH 1 puff    Allergies:   Codeine and Doxycycline   Social History   Social History  . Marital status: Married    Spouse name: N/A  . Number of children: 2  . Years of education: N/A   Occupational History  . Self Employed    Social History Main Topics  . Smoking status: Former Smoker    Packs/day: 1.00    Years: 40.00    Types: Cigarettes    Quit date: 02/05/2014  . Smokeless tobacco: Never Used  . Alcohol use 0.0 oz/week     Comment: Rare- states 1 oz. per week  . Drug use: No  . Sexual activity: Not Asked   Other Topics Concern  . 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   ROS:   Please see the history of present illness.    ROS All other systems reviewed and are negative.  No  flowsheet data found.     PHYSICAL EXAM:   VS:  BP 130/74   Pulse 85   Ht 5\' 11"  (1.803 m)   Wt 206 lb 12.8 oz (93.8 kg)   SpO2 98%   BMI 28.84 kg/m    GEN: Well nourished, well developed, in no acute distress  HEENT: normal  Neck: no JVD, carotid bruits, or masses Cardiac: RRR; no murmurs, rubs, or gallops,no edema.  Intact distal pulses bilaterally.  Respiratory:  clear to auscultation bilaterally, normal work of breathing GI: soft, nontender, nondistended, + BS MS: no deformity or atrophy  Skin: warm and dry, no rash Neuro:  Alert and Oriented x 3, Strength and sensation are intact Psych: euthymic mood, full affect  Wt Readings from Last 3 Encounters:  01/26/17 206 lb 12.8 oz (93.8 kg)  12/28/16 209 lb 8 oz (95 kg)  11/12/16 208 lb 6.4 oz (94.5 kg)      Studies/Labs Reviewed:   EKG:  EKG is not ordered today.   Recent Labs: 03/19/2016: Hemoglobin 15.7; Platelets 238 06/24/2016: ALT 7; BUN 20; Creatinine, Ser 0.81; Potassium 4.7; Sodium 141 09/23/2016: TSH 2.76   Lipid Panel    Component Value Date/Time   CHOL 99 06/24/2016 0843   CHOL 170 01/05/2012   TRIG 93.0 06/24/2016 0843   TRIG 141 01/05/2012   HDL 40.30 06/24/2016 0843   CHOLHDL 2 06/24/2016 0843   VLDL 18.6 06/24/2016 0843   LDLCALC 40 06/24/2016 0843   LDLDIRECT 105 01/05/2012    Additional  studies/ records that were reviewed today include:  CPAP download    ASSESSMENT:    1. OSA (obstructive sleep apnea)   2. Essential hypertension   3. Overweight (BMI 25.0-29.9)   4. Type 2 diabetes mellitus with hyperosmolar coma, without long-term current use of insulin (HCC)   5. Chest pain, unspecified type      PLAN:  In order of problems listed above:  OSA - the patient is tolerating PAP therapy well without any problems. The PAP download was reviewed today and showed an AHI of 4.1/hr on 16/12 cm H2O with 93% compliance in using more than 4 hours nightly.  The patient has been using and benefiting from CPAP use and will continue to benefit from therapy.   2.  HTN - his BP is well controlled on exam today.  He will continue on amlodipine 5mg  daily and lopressor 12.5mg  BID  3.  Overweight - I have encouraged him to get into a routine exercise program and cut back on carbs and portions.   4.  DM type 2  Uncomplicated - continue to followup with PCP  5.  Chest pain ? Etiology - he has a history of Zencker's diverticulum and he is unsure whether the CP is due to GERD or is heart.  He never had angina with his CAD in the past.   I will get a lexiscan myoview to rule out ischemia (he cannot walk due to hip pain).  I will set up followup with his Cardiologist, Dr. [name], after the study.  He also is planning to get a hip repair.     Medication Adjustments/Labs and Tests Ordered: Current medicines are reviewed at length with the patient today.  Concerns regarding medicines are outlined above.  Medication changes, Labs and Tests ordered today are listed in the Patient Instructions below.  There are no Patient Instructions on file for this visit.   Signed, Fransico Him, MD  01/26/2017 3:10 PM  [facility] [address], [place], [place]  [address] Phone: [phone]; Fax: [phone]

## 2017-02-11 NOTE — Discharge Instructions (Signed)

## 2017-02-11 NOTE — Progress Notes (Signed)
500 urine output.

## 2017-02-11 NOTE — Interval H&P Note (Signed)
Cath Lab Visit (complete for each Cath Lab visit)  Clinical Evaluation Leading to the Procedure:   ACS: No.  Non-ACS:    Anginal Classification: CCS III  Anti-ischemic medical therapy: Maximal Therapy (2 or more classes of medications)  Non-Invasive Test Results: Low-risk stress test findings: cardiac mortality <1%/year  Prior CABG: Previous CABG      History and Physical Interval Note:  02/11/2017 1:28 PM  Carl Lee  has presented today for surgery, with the diagnosis of abnormal stress test  The various methods of treatment have been discussed with the patient and family. After consideration of risks, benefits and other options for treatment, the patient has consented to  Procedure(s): LEFT HEART CATH AND CORS/GRAFTS ANGIOGRAPHY (N/A) as a surgical intervention .  The patient's history has been reviewed, patient examined, no change in status, stable for surgery.  I have reviewed the patient's chart and labs.  Questions were answered to the patient's satisfaction.     Shelva Majestic

## 2017-02-11 NOTE — Progress Notes (Signed)
Up and walked and tol well; right groin stable no bleeding or hematoma 

## 2017-02-11 NOTE — Progress Notes (Signed)
Order for sheath removal verified per post procedural orders. Procedure explained to patient and femoral artery access site assessed: level 0, palpable dorsalis pedis and posterior tibial pulses. 5 [place] Sheath removed and manual pressure applied for 20 minutes. Pre, peri, & post procedural vitals: HR 69, RR 12, O2 Sat 94, BP 144/85, Pain level 0. Distal pulses remained intact after sheath removal. Access site level 0 and dressed with 4X4 gauze and tegaderm. RN confirmed condition of site. Post procedural instructions discussed with return demonstration from patient.

## 2017-02-14 ENCOUNTER — Encounter (HOSPITAL_COMMUNITY): Payer: Self-pay | Admitting: Cardiovascular Disease

## 2017-02-16 ENCOUNTER — Encounter: Payer: Self-pay | Admitting: *Deleted

## 2017-02-16 ENCOUNTER — Telehealth: Payer: Self-pay | Admitting: Cardiovascular Disease

## 2017-02-16 NOTE — Telephone Encounter (Signed)
New Message    Patient calling for results of heart cath from last Friday, no one has called him.

## 2017-02-16 NOTE — Telephone Encounter (Signed)
Have already done this  Clear to have hip surgery

## 2017-02-16 NOTE — Telephone Encounter (Signed)
Patient is requesting that surgical clearance for hip surgery by sent to his orthopedic surgeon (Dr. [name] [email or web address]). A copy of heart cath with disposition of cardiac clearance has been faxed to his surgeon.  Patient does not have a return appointment for post cath. Please advise if he need to be seen prior to October 2018 (his regular 6 mth f/u) or if he need to be seen sooner based on his heart cath result.

## 2017-02-16 NOTE — Telephone Encounter (Signed)
Patient informed.  Patient's 6 mth f/u scheduled and information emailed to patient per his request.

## 2017-02-22 ENCOUNTER — Ambulatory Visit (INDEPENDENT_AMBULATORY_CARE_PROVIDER_SITE_OTHER): Payer: PPO | Admitting: Gastroenterology

## 2017-02-22 ENCOUNTER — Encounter: Payer: Self-pay | Admitting: Gastroenterology

## 2017-02-22 VITALS — BP 132/70 | HR 68 | Ht 68.25 in | Wt 206.4 lb

## 2017-02-22 DIAGNOSIS — Z860100 Personal history of colon polyps, unspecified: Secondary | ICD-10-CM

## 2017-02-22 DIAGNOSIS — R1310 Dysphagia, unspecified: Secondary | ICD-10-CM

## 2017-02-22 DIAGNOSIS — K225 Diverticulum of esophagus, acquired: Secondary | ICD-10-CM | POA: Diagnosis not present

## 2017-02-22 MED ORDER — NA SULFATE-K SULFATE-MG SULF 17.5-3.13-1.6 GM/177ML PO SOLN: 1.0000 | Freq: Once | ORAL | 0 refills | Status: DC

## 2017-02-22 NOTE — Patient Instructions (Addendum)
You will be set up for a colonoscopy for h/o polyps.  You will be set up for an upper endoscopy for dysphagia.  Hi resolution esophageal manometry. At same time as the EGD and colonoscopy at [facility], in December 2018 to give time to heal from upcoming hip surgery.  If all of the above looks OK, will refer to [facility] Dr. Gayleen Orem to consider endoscopic Zenker's repair.  Cut back to once daily pepcid.  Normal BMI (Body Mass Index- based on height and weight) is between 19 and 25. Your BMI today is Body mass index is 31.15 kg/m. [name] Please consider follow up  regarding your BMI with your Primary Care Provider.

## 2017-02-22 NOTE — Progress Notes (Signed)
Review of pertinent gastrointestinal problems: 1. H/o adenomatous colon polyps: colonoscopy December 2013;  one small polyp that was removed and was a hyperplastic polyp.  Colonoscopy 06/2009 two adenomas, one was 71mm.  2. Zenker's diverticulum, Barium esophagram 04/2014: small zenker's diverticululm felt likely to be causing dysphagia 2015 GI evaluation: Evaluation with Dr. Servando Snare 08/2014: dysphagia had improved, he declined further workup.   Barium esophagram 04/2016: "Zenker's diverticulum which fills with contrast without clearing."    HPI: This is a very pleasant 61 year old man whom I last saw several years ago about this same problem  Chief complaint is Zenker's diverticulum, history of adenomatous polyp  Had AAA repair ugently 2013; recovered from that well.  Coronary artery bypass grafting 2018.   He still has dysphagia.  Has to chew very very well, then the food hangs in his throat.   He will cough some of it out unless it clears from his throat with some massage maneuvers and extra swallows.  If he doesn't chew well then he will always have problems.  Even if he chews very well then he has a sensation of the food hanging.  Stable weight.  Liquids swallow OK.    Takes pepcid twice daily.  On that he has no GERD symptoms.  ROS: complete GI ROS as described in HPI, all other review negative.  Constitutional:  No unintentional weight loss   Past Medical History:  Diagnosis Date  . AAA (abdominal aortic aneurysm) ([place]) 03/25/12   s/p endovascular repair  . Adenomatous polyp 06/2009  . Atrial tachycardia, paroxysmal ([place]) 06/18/2016  . Bladder cancer ([place]) 08/16/2008   recurrence 2015 and 2018 treated with office fulguration  . CAD (coronary artery disease), native coronary artery    S/p 3v CABG 01/2014   . Childhood asthma   . Contact dermatitis    atypical [name])  . COPD (chronic obstructive pulmonary disease) ([place])   . Coronary artery disease    a.2015: CABG  w/ LIMA-LAD, RIMA-OM, SVG-RCA b. Cath on 07/10/15 w/ patent LIMA-LAD and RIMA-OM. SVG-RCA occluded but collaterals present  . Diverticulosis 2013   severe by CT and colonoscopy  . Environmental allergies    improved as ages  . Fracture of lateral malleolus of left ankle 08/29/2013  . Hepatic steatosis 06/2015   by [place]  . Hx of migraines   . Hypertension   . Lumbosacral radiculopathy at S1 03/2012   left, with spinal stenosis (MRI 04/2013) improved with TF ESI L5/S1 and S1/2 (Dalton [place])  . OSA (obstructive sleep apnea) 09/04/2014   Severe with AHI 35/hr  . Overweight (BMI 25.0-29.9) 01/26/2017  . Prediabetes 06/08/2014   CAD   . Resistance to clopidogrel 2015   drug metabolism panel run - asked to scan  . Spinal stenosis    LS1 nerve root impingement from bulging disc  . Vitamin B12 deficiency   . Zenker diverticulum     Past Surgical History:  Procedure Laterality Date  . ABDOMINAL AORTIC ANEURYSM REPAIR  03/25/12   endovascular  . ABI  05/2013   WNL, L TBI low at 0.66  . Bladder cancer  March 2010 and Oct. 2015   Ernst Spell) x 2  . CARDIAC CATHETERIZATION N/A 07/10/2015   Procedure: Left Heart Cath and Cors/Grafts Angiography;  Surgeon: [name], MD;  Location: [place] [facility];  Service: Cardiovascular;  Laterality: N/A;  . COLONOSCOPY  06/2012   hyperplastic polyp, diverticulosis ([place]) rec rpt 5 yrs  . CORONARY ARTERY BYPASS GRAFT  N/A 02/07/2014   Procedure: CORONARY ARTERY BYPASS GRAFTING (CABG);  Surgeon: [name], MD;  Location: [facility];  Service: Open Heart Surgery;  Laterality: N/A;  CABG X 3, BILATERAL LIMA, EVH  . CYSTOSCOPY  08/16/08   Bladder Cancer  . EPIDURAL BLOCK INJECTION Left 09/2014, 10/2014, 12/2014   medial L2,3,4, dorsal L5 ramus blocks x2, L L5/S1 and S1/2 transforaminal ESI (Dalton [place])  . ESI  04/2013, 06/2013   L L5S1, S12 transforaminal ESI (Dr.  [name])  . ESI Left 04/2014, 05/2014, 06/2014   L5/S1, S1/2; rpt; L4/5   . ESI  03/2016   R L5/S1 interlaminar ESI  . FULGURATION OF BLADDER TUMOR  01/2017   recurrent 30mm L lateral wall papillary transitional cell carcinoma ([place])  . INTRAOPERATIVE TRANSESOPHAGEAL ECHOCARDIOGRAM N/A 02/07/2014   Procedure: INTRAOPERATIVE TRANSESOPHAGEAL ECHOCARDIOGRAM;  Surgeon: [name], MD;  Location: [facility];  Service: Open Heart Surgery;  Laterality: N/A;  . LEFT HEART CATH AND CORS/GRAFTS ANGIOGRAPHY N/A 02/11/2017   Procedure: LEFT HEART CATH AND CORS/GRAFTS ANGIOGRAPHY;  Surgeon: [name], MD;  Location: [facility];  Service: Cardiovascular;  Laterality: N/A;  . LEFT HEART CATHETERIZATION WITH CORONARY ANGIOGRAM N/A 02/01/2014   Procedure: LEFT HEART CATHETERIZATION WITH CORONARY ANGIOGRAM;  Surgeon: [name], MD;  Location: [facility] CATH LAB;  Service: Cardiovascular;  Laterality: N/A;  . PRP epidural injection Left 11/2015   L5/S1, S1/2 transforaminal epidural PRP injections under fluoroscopy ([place])  . TONSILLECTOMY AND ADENOIDECTOMY  1973  . VASECTOMY      Current Outpatient Prescriptions  Medication Sig Dispense Refill  . albuterol (PROAIR HFA) 108 (90 Base) MCG/ACT inhaler Inhale 2 puffs into the lungs every 6 (six) hours as needed for wheezing or shortness of breath. 1 Inhaler 2  . amLODipine (NORVASC) 5 MG tablet Take 1 tablet (5 mg total) by mouth daily. 90 tablet 3  . aspirin EC 325 MG EC tablet Take 1 tablet (325 mg total) by mouth daily.    [name] atorvastatin (LIPITOR) 20 MG tablet TAKE 1 TABLET (20 MG TOTAL) BY MOUTH DAILY AT 6 PM. 90 tablet 3  . famotidine (PEPCID) 20 MG tablet Take 20 mg by mouth 2 (two) times daily.     . fluticasone furoate-vilanterol (BREO ELLIPTA) 100-25 MCG/INH AEPB Inhale 1 puff into the lungs daily. 60 each 6  . metoprolol tartrate (LOPRESSOR) 25 MG tablet Take 12.5 mg by mouth 2 (two) times daily.    [name] oxyCODONE-acetaminophen (PERCOCET) 10-325 MG tablet Take 1 tablet by mouth every 4 (four) hours  as needed for pain.    . vitamin B-12 (CYANOCOBALAMIN) 500 MCG tablet Take 1 tablet (500 mcg total) by mouth daily. (Patient taking differently: Take 500 mcg by mouth every other day. )     Current Facility-Administered Medications  Medication Dose Route Frequency Provider Last Rate Last Dose  . umeclidinium-vilanterol (ANORO ELLIPTA) 62.5-25 MCG/INH 1 puff  1 puff Inhalation Daily Juanito Doom, MD        Allergies as of 02/22/2017 - Review Complete 02/22/2017  Allergen Reaction Noted  . Codeine Other (See Comments) 03/24/2012  . Doxycycline Hives 06/24/2015    Family History  Problem Relation Age of Onset  . Diabetes Mother   . Arrhythmia Mother        pacemaker  . Cancer Mother        Bladder  . COPD Mother   . Thyroid disease Mother   . Hyperlipidemia Mother   .  Hypertension Mother   . Diabetes Father   . CAD Father 78       CHF, MI  . Dementia Father   . Heart attack Father   . Diabetes Sister   . Diabetes Brother   . Hypertension Brother   . Heart attack Maternal Grandmother   . Colon cancer Neg Hx     Social History   Social History  . Marital status: Married    Spouse name: N/A  . Number of children: 2  . Years of education: N/A   Occupational History  . Self Employed    Social History Main Topics  . Smoking status: Former Smoker    Packs/day: 1.00    Years: 40.00    Types: Cigarettes    Quit date: 02/05/2014  . Smokeless tobacco: Never Used  . Alcohol use 0.0 oz/week     Comment: Rare- states 1 oz. per week  . Drug use: No  . Sexual activity: Not on file   Other Topics Concern  . 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 male with Symptomatic Zenker's diverticulum and personal history of adenomatous polyps  I do believe the swallowing issues he is describing are from his Zenker's diverticulum. I explained to him that there are endoscopic options for treatment of his Zenker's diverticulum mouth I recommended upper endoscopy and esophageal manometry prior to referral for endoscopic Zenker's treatment just to make sure we are not missing anything else. He agreed but does not wish to do anything testing until later this year to allow him to have some time to recover from an upcoming left hip surgery. I think that is reasonable. We will put him in our reminder system for colonoscopy for polyp surveillance, same time upper endoscopy and esophageal manometry. Given the Zenker's upper endoscopy will be necessary to ensure proper placement of the manometry tube.  Please see the "Patient Instructions" section for addition details about the plan.  Owens Loffler, MD [facility] 02/22/2017, 3:35 PM

## 2017-02-23 ENCOUNTER — Ambulatory Visit (INDEPENDENT_AMBULATORY_CARE_PROVIDER_SITE_OTHER): Payer: PPO | Admitting: Physician Assistant

## 2017-02-23 ENCOUNTER — Ambulatory Visit (HOSPITAL_COMMUNITY)
Admission: RE | Admit: 2017-02-23 | Discharge: 2017-02-23 | Disposition: A | Discharge status: Home / Self Care | Payer: PPO | Source: Ambulatory Visit | Attending: Orthopaedic Surgery | Admitting: Orthopaedic Surgery

## 2017-02-23 DIAGNOSIS — M79662 Pain in left lower leg: Secondary | ICD-10-CM | POA: Diagnosis not present

## 2017-02-23 DIAGNOSIS — M25562 Pain in left knee: Secondary | ICD-10-CM

## 2017-02-23 DIAGNOSIS — M1612 Unilateral primary osteoarthritis, left hip: Secondary | ICD-10-CM | POA: Diagnosis not present

## 2017-02-23 DIAGNOSIS — M7989 Other specified soft tissue disorders: Secondary | ICD-10-CM | POA: Diagnosis not present

## 2017-02-23 MED ORDER — METHYLPREDNISOLONE ACETATE 40 MG/ML IJ SUSP
40.0000 mg | INTRAMUSCULAR | Status: AC | PRN
  Administered 2017-02-23: 40 mg via INTRA_ARTICULAR

## 2017-02-23 MED ORDER — OXYCODONE-ACETAMINOPHEN 5-325 MG PO TABS: 1.0000 | ORAL_TABLET | Freq: Two times a day (BID) | ORAL | 0 refills | Status: DC

## 2017-02-23 MED ORDER — LIDOCAINE HCL 1 % IJ SOLN
3.0000 mL | INTRAMUSCULAR | Status: AC | PRN
  Administered 2017-02-23: 3 mL

## 2017-02-23 NOTE — Pre-Procedure Instructions (Signed)
Carl Lee  02/23/2017      [id number] [place], [name] [address] Phone: [phone] Fax: [phone]    Your procedure is scheduled on Tuesday august 28.  Report to [facility] Admitting at 12:45 A.M.  Call this number if you have problems the morning of surgery:  [phone]   Remember:  Do not eat food or drink liquids after midnight.  Take these medicines the morning of surgery with A SIP OF WATER: amlodipine (norvasc), famotidine (pepcid), metoprolol (lopressor), oxycodone (percocet) if needed, albuterol (inhaler), BREO Ellipta  Please bring inhalers to hospital with you  7 days prior to surgery STOP taking any Aspirin, Aleve, Naproxen, Ibuprofen, Motrin, Advil, Goody's, BC's, all herbal medications, fish oil, and all vitamins    Do not wear jewelry  Do not wear lotions, powders, or colognes, or deoderant.  Do not shave 48 hours prior to surgery.  Men may shave face and neck.  Do not bring valuables to the hospital.  [facility] is not responsible for any belongings or valuables.  Contacts, dentures or bridgework may not be worn into surgery.  Leave your suitcase in the car.  After surgery it may be brought to your room.  For patients admitted to the hospital, discharge time will be determined by your treatment team.  Patients discharged the day of surgery will not be allowed to drive home.    Special instructions:    [place]- Preparing For Surgery  Before surgery, you can play an important role. Because skin is not sterile, your skin needs to be as free of germs as possible. You can reduce the number of germs on your skin by washing with CHG (chlorahexidine gluconate) Soap before surgery.  CHG is an antiseptic cleaner which kills germs and bonds with the skin to continue killing germs even after washing.  Please do not use if you have an allergy to CHG or antibacterial soaps. If your skin  becomes reddened/irritated stop using the CHG.  Do not shave (including legs and underarms) for at least 48 hours prior to first CHG shower. It is OK to shave your face.  Please follow these instructions carefully.   1. Shower the NIGHT BEFORE SURGERY and the MORNING OF SURGERY with CHG.   2. If you chose to wash your hair, wash your hair first as usual with your normal shampoo.  3. After you shampoo, rinse your hair and body thoroughly to remove the shampoo.  4. Use CHG as you would any other liquid soap. You can apply CHG directly to the skin and wash gently with a scrungie or a clean washcloth.   5. Apply the CHG Soap to your body ONLY FROM THE NECK DOWN.  Do not use on open wounds or open sores. Avoid contact with your eyes, ears, mouth and genitals (private parts). Wash genitals (private parts) with your normal soap.  6. Wash thoroughly, paying special attention to the area where your surgery will be performed.  7. Thoroughly rinse your body with warm water from the neck down.  8. DO NOT shower/wash with your normal soap after using and rinsing off the CHG Soap.  9. Pat yourself dry with a CLEAN TOWEL.   10. Wear CLEAN PAJAMAS   11. Place CLEAN SHEETS on your bed the night of your first shower and DO NOT SLEEP WITH PETS.    Day of Surgery: Do not apply  any deodorants/lotions. Please wear clean clothes to the hospital/surgery center.      Please read over the following fact sheets that you were given. Total Joint Packet and MRSA Information

## 2017-02-23 NOTE — Progress Notes (Signed)
**  Preliminary report by tech**  Left lower extremity venous duplex complete. There is no evidence of deep or superficial vein thrombosis involving the left lower extremity. All visualized vessels appear patent and compressible. There is no evidence of a Baker's cyst on the left. Results were faxed to Dr. Trevor Mace office.  02/23/17 2:14 PM Carl Lee RVT

## 2017-02-23 NOTE — Progress Notes (Signed)
Office Visit Note   Patient: Carl Lee           Date of Birth: 10-Mar-1956           MRN: [id number] Visit Date: 02/23/2017              Requested by: [name], MD [address] [place], [place] [address] PCP: [name], MD   Assessment & Plan: Visit Diagnoses:  1. Left leg swelling   2. Acute pain of left knee   3. Primary osteoarthritis of left hip     Plan: Left total hip arthroplasty as scheduled 03/08/17. Percocet Rx given he is to use this sparingly. Regards to knee did discuss with him that this would be beneficial to give him an injection to see if in fact his pain is coming from the knee. Questions are encouraged and answered. Discussed  hip surgery and the postoperative protocol. Questions that he and his wife had were answered at length today. Due to his left leg swelling and pain in his calf we will obtain a Doppler to rule out DVT.  Follow-Up Instructions: Return in about 2 weeks (around 03/09/2017) for 2 weeks post-op.   Orders:  Orders Placed This Encounter  Procedures  . Large Joint Injection/Arthrocentesis   Meds ordered this encounter  Medications  . oxyCODONE-acetaminophen (PERCOCET/ROXICET) 5-325 MG tablet    Sig: Take 1 tablet by mouth 2 (two) times daily.    Dispense:  30 tablet    Refill:  0      Procedures: Large Joint Inj Date/Time: 02/23/2017 11:57 AM Performed by: Pete Pelt Authorized by: Pete Pelt   Consent Given by:  Patient Indications:  Pain Location:  Knee Site:  L knee Needle Size:  22 G Needle Length:  1.5 inches Approach:  Anterolateral Ultrasound Guidance: No   Fluoroscopic Guidance: No   Medications:  3 mL lidocaine 1 %; 40 mg methylPREDNISolone acetate 40 MG/ML Aspiration Attempted: No   Patient tolerance:  Patient tolerated the procedure well with no immediate complications      Clinical Data: No additional findings.   Subjective: Left hip pain Left knee pain  HPI History  of [place] comes in today due to questions prior to his upcoming left total hip arthroplasty scheduled for 03/08/2017. He also is having increased left knee pain and swelling in his left leg and some calf pain. He's had no shortness breath fevers chills. He is unsure if the knee pain is coming from his hip or if this is something new. He is also asking for refill on pain medicine states tramadol was not helping with his knee pain and hip pain at this point time. Review of Systems No chest pain shortness breath fevers chills  Objective: Vital Signs: There were no vitals taken for this visit.  Physical Exam  Constitutional: He is oriented to person, place, and time. He appears well-developed and well-nourished. No distress.  Pulmonary/Chest: Effort normal.  Neurological: He is alert and oriented to person, place, and time.  Skin: He is not diaphoretic.  Psychiatric: He has a normal mood and affect. His behavior is normal.    Ortho Exam Left knee non tender. No effusion abnormal warmth erythema. No instability valgus varus stressing. Left hip severe pain with internal/external rotation and limited rotation.  No specialty comments available.  Imaging: No results found.   PMFS History: Patient Active Problem List   Diagnosis Date Noted  . Abnormal nuclear stress test   .  Overweight (BMI 25.0-29.9) 01/26/2017  . Radiculopathy due to lumbar intervertebral disc disorder 10/21/2016  . Osteoarthritis of left hip 08/16/2016  . Atrial tachycardia, paroxysmal ([place]) 06/18/2016  . Chronic fatigue 04/27/2016  . Coronary artery disease   . Abdominal discomfort 06/24/2015  . Hepatic steatosis 06/12/2015  . Right trigeminal neuralgia 11/27/2014  . Chest pain 10/30/2014  . Vitamin B12 deficiency   . OSA (obstructive sleep apnea) 09/04/2014  . Headache 06/20/2014  . Prediabetes 06/08/2014  . Dysphagia 04/19/2014  . Zenker's diverticulum 04/19/2014  . COPD, moderate ([place]) 03/26/2014  . S/P CABG  x 3 02/07/2014  . Occlusion and stenosis of vertebral artery without mention of cerebral infarction 11/12/2013  . Skin rash 04/17/2013  . Healthcare maintenance 01/09/2013  . Dyslipidemia 01/09/2013  . Lumbosacral radiculopathy at S1 09/14/2012  . Hx of migraines   . Arthritis   . History of repair of aneurysm of abdominal aorta using endovascular stent graft 05/01/2012  . Adenomatous polyps 03/24/2012  . Ex-smoker 03/24/2012  . Hypertension 03/24/2012  . Bladder cancer ([place]) 03/24/2012   Past Medical History:  Diagnosis Date  . AAA (abdominal aortic aneurysm) ([place]) 03/25/12   s/p endovascular repair  . Adenomatous polyp 06/2009  . Atrial tachycardia, paroxysmal ([place]) 06/18/2016  . Bladder cancer ([place]) 08/16/2008   recurrence 2015 and 2018 treated with office fulguration  . CAD (coronary artery disease), native coronary artery    S/p 3v CABG 01/2014   . Childhood asthma   . Contact dermatitis    atypical [name])  . COPD (chronic obstructive pulmonary disease) ([place])   . Coronary artery disease    a.2015: CABG w/ LIMA-LAD, RIMA-OM, SVG-RCA b. Cath on 07/10/15 w/ patent LIMA-LAD and RIMA-OM. SVG-RCA occluded but collaterals present  . Diverticulosis 2013   severe by CT and colonoscopy  . Environmental allergies    improved as ages  . Fracture of lateral malleolus of left ankle 08/29/2013  . Hepatic steatosis 06/2015   by [place]  . Hx of migraines   . Hypertension   . Lumbosacral radiculopathy at S1 03/2012   left, with spinal stenosis (MRI 04/2013) improved with TF ESI L5/S1 and S1/2 ([place])  . OSA (obstructive sleep apnea) 09/04/2014   Severe with AHI 35/hr  . Overweight (BMI 25.0-29.9) 01/26/2017  . Prediabetes 06/08/2014   CAD   . Resistance to clopidogrel 2015   drug metabolism panel run - asked to scan  . Spinal stenosis    LS1 nerve root impingement from bulging disc  . Vitamin B12 deficiency   . Zenker diverticulum     Family History  Problem Relation Age  of Onset  . Diabetes Mother   . Arrhythmia Mother        pacemaker  . Cancer Mother        Bladder  . COPD Mother   . Thyroid disease Mother   . Hyperlipidemia Mother   . Hypertension Mother   . Diabetes Father   . CAD Father 15       CHF, MI  . Dementia Father   . Heart attack Father   . Diabetes Sister   . Diabetes Brother   . Hypertension Brother   . Heart attack Maternal Grandmother   . Colon cancer Neg Hx     Past Surgical History:  Procedure Laterality Date  . ABDOMINAL AORTIC ANEURYSM REPAIR  03/25/12   endovascular  . ABI  05/2013   WNL, L TBI low at 0.66  . Bladder cancer  March 2010 and Oct. 2015   Ernst Spell) x 2  . CARDIAC CATHETERIZATION N/A 07/10/2015   Procedure: Left Heart Cath and Cors/Grafts Angiography;  Surgeon: [name], MD;  Location: [facility];  Service: Cardiovascular;  Laterality: N/A;  . COLONOSCOPY  06/2012   hyperplastic polyp, diverticulosis ([place]) rec rpt 5 yrs  . CORONARY ARTERY BYPASS GRAFT N/A 02/07/2014   Procedure: CORONARY ARTERY BYPASS GRAFTING (CABG);  Surgeon: [name], MD;  Location: [facility];  Service: Open Heart Surgery;  Laterality: N/A;  CABG X 3, BILATERAL LIMA, EVH  . CYSTOSCOPY  08/16/08   Bladder Cancer  . EPIDURAL BLOCK INJECTION Left 09/2014, 10/2014, 12/2014   medial L2,3,4, dorsal L5 ramus blocks x2, L L5/S1 and S1/2 transforaminal ESI ([place])  . ESI  04/2013, 06/2013   L L5S1, S12 transforaminal ESI (Dr.  [name])  . ESI Left 04/2014, 05/2014, 06/2014   L5/S1, S1/2; rpt; L4/5  . ESI  03/2016   R L5/S1 interlaminar ESI  . FULGURATION OF BLADDER TUMOR  01/2017   recurrent 43mm L lateral wall papillary transitional cell carcinoma ([place])  . INTRAOPERATIVE TRANSESOPHAGEAL ECHOCARDIOGRAM N/A 02/07/2014   Procedure: INTRAOPERATIVE TRANSESOPHAGEAL ECHOCARDIOGRAM;  Surgeon: [name], MD;  Location: [facility];  Service: Open Heart Surgery;  Laterality: N/A;  . LEFT HEART CATH AND  CORS/GRAFTS ANGIOGRAPHY N/A 02/11/2017   Procedure: LEFT HEART CATH AND CORS/GRAFTS ANGIOGRAPHY;  Surgeon: [name], MD;  Location: [facility];  Service: Cardiovascular;  Laterality: N/A;  . LEFT HEART CATHETERIZATION WITH CORONARY ANGIOGRAM N/A 02/01/2014   Procedure: LEFT HEART CATHETERIZATION WITH CORONARY ANGIOGRAM;  Surgeon: [name], MD;  Location: [facility];  Service: Cardiovascular;  Laterality: N/A;  . PRP epidural injection Left 11/2015   L5/S1, S1/2 transforaminal epidural PRP injections under fluoroscopy ([place])  . TONSILLECTOMY AND ADENOIDECTOMY  1973  . VASECTOMY     Social History   Occupational History  . Self Employed    Social History Main Topics  . Smoking status: Former Smoker    Packs/day: 1.00    Years: 40.00    Types: Cigarettes    Quit date: 02/05/2014  . Smokeless tobacco: Never Used  . Alcohol use 0.0 oz/week     Comment: Rare- states 1 oz. per week  . Drug use: No  . Sexual activity: Not on file

## 2017-02-24 ENCOUNTER — Encounter (HOSPITAL_COMMUNITY)
Admission: RE | Admit: 2017-02-24 | Discharge: 2017-02-24 | Disposition: A | Discharge status: Home / Self Care | Payer: PPO | Source: Ambulatory Visit | Attending: Orthopaedic Surgery | Admitting: Orthopaedic Surgery

## 2017-02-24 ENCOUNTER — Encounter (HOSPITAL_COMMUNITY): Payer: Self-pay

## 2017-02-24 ENCOUNTER — Other Ambulatory Visit (INDEPENDENT_AMBULATORY_CARE_PROVIDER_SITE_OTHER): Payer: Self-pay | Admitting: Orthopaedic Surgery

## 2017-02-24 ENCOUNTER — Other Ambulatory Visit (INDEPENDENT_AMBULATORY_CARE_PROVIDER_SITE_OTHER): Payer: Self-pay | Admitting: Physician Assistant

## 2017-02-24 DIAGNOSIS — Z01818 Encounter for other preprocedural examination: Secondary | ICD-10-CM | POA: Insufficient documentation

## 2017-02-24 HISTORY — DX: Headache, unspecified: R51.9

## 2017-02-24 HISTORY — DX: Headache: R51

## 2017-02-24 LAB — COMPREHENSIVE METABOLIC PANEL
ALBUMIN: 4.1 g/dL (ref 3.5–5.0)
ALK PHOS: 78 U/L (ref 38–126)
ALT: 9 U/L — AB (ref 17–63)
AST: 16 U/L (ref 15–41)
Anion gap: 9 (ref 5–15)
BUN: 15 mg/dL (ref 6–20)
CALCIUM: 9.2 mg/dL (ref 8.9–10.3)
CO2: 23 mmol/L (ref 22–32)
CREATININE: 0.86 mg/dL (ref 0.61–1.24)
Chloride: 107 mmol/L (ref 101–111)
GFR calc Af Amer: >60 mL/min (ref >60)
GFR calc non Af Amer: >60 mL/min (ref >60)
GLUCOSE: 119 mg/dL — AB (ref 65–99)
Potassium: 4.2 mmol/L (ref 3.5–5.1)
SODIUM: 139 mmol/L (ref 135–145)
Total Bilirubin: 0.9 mg/dL (ref 0.3–1.2)
Total Protein: 6.5 g/dL (ref 6.5–8.1)

## 2017-02-24 LAB — CBC
HCT: 46.2 % (ref 39.0–52.0)
Hemoglobin: 16 g/dL (ref 13.0–17.0)
MCH: 31.7 pg (ref 26.0–34.0)
MCHC: 34.6 g/dL (ref 30.0–36.0)
MCV: 91.5 fL (ref 78.0–100.0)
PLATELETS: 251 10*3/uL (ref 150–400)
RBC: 5.05 MIL/uL (ref 4.22–5.81)
RDW: 13.4 % (ref 11.5–15.5)
WBC: 10 10*3/uL (ref 4.0–10.5)

## 2017-02-24 LAB — SURGICAL PCR SCREEN
MRSA, PCR: NEGATIVE
Staphylococcus aureus: NEGATIVE

## 2017-02-24 LAB — HEMOGLOBIN A1C
HEMOGLOBIN A1C: 6 % — AB (ref 4.8–5.6)
Mean Plasma Glucose: 125.5 mg/dL

## 2017-02-24 LAB — GLUCOSE, CAPILLARY: Glucose-Capillary: 111 mg/dL — ABNORMAL HIGH (ref 65–99)

## 2017-02-24 NOTE — Progress Notes (Signed)
   How to Manage Your Diabetes Before and After Surgery  Why is it important to control my blood sugar before and after surgery? . Improving blood sugar levels before and after surgery helps healing and can limit problems. . A way of improving blood sugar control is eating a healthy diet by: o  Eating less sugar and carbohydrates o  Increasing activity/exercise o  Talking with your doctor about reaching your blood sugar goals . High blood sugars (greater than 180 mg/dL) can raise your risk of infections and slow your recovery, so you will need to focus on controlling your diabetes during the weeks before surgery. . Make sure that the doctor who takes care of your diabetes knows about your planned surgery including the date and location.  How do I manage my blood sugar before surgery? . Check your blood sugar at least 4 times a day, starting 2 days before surgery, to make sure that the level is not too high or low. o Check your blood sugar the morning of your surgery when you wake up and every 2 hours until you get to the Short Stay unit. . If your blood sugar is less than 70 mg/dL, you will need to treat for low blood sugar: o Do not take insulin. o Treat a low blood sugar (less than 70 mg/dL) with  cup of clear juice (cranberry or apple), 4 glucose tablets, OR glucose gel. o Recheck blood sugar in 15 minutes after treatment (to make sure it is greater than 70 mg/dL). If your blood sugar is not greater than 70 mg/dL on recheck, call [phone] for further instructions. . Report your blood sugar to the short stay nurse when you get to Short Stay.  . If you are admitted to the hospital after surgery: o Your blood sugar will be checked by the staff and you will probably be given insulin after surgery (instead of oral diabetes medicines) to make sure you have good blood sugar levels. o The goal for blood sugar control after surgery is 80-180 mg/dL.           

## 2017-02-24 NOTE — Progress Notes (Signed)
PCP - [name] Cardiologist -  Dr. [name], celared for surgery 02/16/17  EKG - 02/11/17 Stress Test - 01/28/17 ECHO - 11/11/15 Cardiac Cath - 02/11/17  Sleep Study - 10/27/16, pt is wearing a BIPAP at home at night.   Fasting Blood Sugar - pt states he rarely checks his blood sugars, but when he does it is around 90. Pt is pre-diabetic and last A1c was in December.   Pt was not given instructions on when to stop 325mg  aspirin. Pt has cardiac hx and was cleared by Dr. [name]. Message left with Almedia Balls at Dr. Trevor Mace office regarding this. Pt states he will call office if he does not hear by by tomorrow about when to stop Aspirin.   Patient denies shortness of breath, fever, cough and chest pain at PAT appointment  Patient verbalized understanding of instructions that were given to them at the PAT appointment. Patient was also instructed that they will need to review over the PAT instructions again at home before surgery.

## 2017-02-25 DIAGNOSIS — G4733 Obstructive sleep apnea (adult) (pediatric): Secondary | ICD-10-CM | POA: Diagnosis not present

## 2017-02-25 NOTE — Progress Notes (Addendum)
Anesthesia Chart Review:  Pt is a 61 year old male scheduled for L total hip arthroplasty anterior approach on 02/28/2017 with [name], M.D.  - PCP is [name], MD - Cardiologist is Fransico Him, MD.  Pt cleared for surgery in comment on cardiac cath (report below) - Vascular surgeon is Harold Barban, MD  PMH includes: CAD (s/p CABG 02/07/14), paroxysmal atrial tachycardia, AAA (status post endovascular repair), HTN, pre-diabetes, COPD, OSA, hepatic steatosis, bladder cancer. Former smoker. BMI 30.  Medications include: Albuterol, amlodipine, ASA 325 mg, Lipitor, Pepcid, Breo ellipta, metoprolol, anoro ellipta  Preoperative labs reviewed. HbA1c 6.0, glucose 119.  CXR 03/19/16: No edema or consolidation.  EKG 02/11/17: NSR  Cardiac cath 02/11/17 (done for abnormal stress test):   Prox RCA lesion, 100 %stenosed.  Origin to Prox Graft lesion, 100 %stenosed.  Ost LAD to Prox LAD lesion, 100 %stenosed.  LIMA and is normal in caliber.  3rd Mrg lesion, 40 %stenosed.  RIMA and is normal in caliber.  Ost Cx lesion, 55 %stenosed.  Prox Cx lesion, 20 %stenosed.  There is mild left ventricular systolic dysfunction. - Mild LV dysfunction with an ejection fraction of 45-50% with mild mid to basal inferior hypocontractility and a nipple appearing focal apical aneurysm. - Significant native CAD with total occlusion of the proximal LAD after a prominent septal perforating artery; 55% stenosis prior to an ectatic aneurysmal segment in the proximal circumflex 20% mild narrowing between the OM1 and OM 2 with retrograde filling into the OM 2 via the RIMA graft; and total occlusion of the proximal RCA with bridging collaterals to the mid RCA and collaterals arising from the distal circumflex and septal perforating artery of the LAD supplying the very distal branch vessels. - Patent LIMA graft supplying the mid LAD. - Patent RIMA graft supplying the OM 2 vessel of the circumflex - Old  occluded vein graft, which had supplied the PDA region.  Holter monitor 03/25/16:  - SR average HR 72 bpm - Isolated PAC;s / PVC;s - Some short runs 10-15 beats atrial tachycardia  CT angio abd/pelvis 11/28/15: - Endovascular repair of the abdominal aortic aneurysm. Aortic aneurysm sac is excluded and continues to decrease in size. No evidence for an endoleak. - Stent graft remains patent but there is increased mural thrombus in the main stent body as described. - No acute abnormalities in the abdomen or pelvis.  Colonic diverticulosis. EVAR duplex 11/24/15:  1. Suboptimal exam due to body habitus and bowel gas 2. Patent aortic repair with maximum diameter 4.0 cm/s 3. Unable to thoroughly visualize the iliac arteries  Echo 11/11/15:  - Left ventricle: The cavity size was normal. Wall thickness was normal. Systolic function was vigorous. The estimated ejection fraction was in the range of 65% to 70%. Wall motion was normal; there were no regional wall motion abnormalities. Doppler parameters are consistent with abnormal left ventricular relaxation (grade 1 diastolic dysfunction). The E/e&' ratio is between 8-15, suggesting indeterminate LV filling pressure. - Right ventricle: The cavity size was mildly dilated. Systolic function is reduced. - Tricuspid valve: There was trivial regurgitation. - Pulmonary arteries: PA peak pressure: 24 mm Hg (S). - Inferior vena cava: The vessel was normal in size. The respirophasic diameter changes were in the normal range (= 50%), consistent with normal central venous pressure. - Impressions: Compared to the prior study in 2015, the LVEF is higher at 65-70%  If no changes, I anticipate pt can proceed with surgery as scheduled.   Willeen Cass,  FNP-BC [facility]Anesthesiology Phone: [phone] 02/25/2017 4:11 PM

## 2017-02-28 ENCOUNTER — Other Ambulatory Visit (INDEPENDENT_AMBULATORY_CARE_PROVIDER_SITE_OTHER): Payer: Self-pay | Admitting: Orthopaedic Surgery

## 2017-03-02 ENCOUNTER — Ambulatory Visit (HOSPITAL_COMMUNITY): Admit: 2017-03-02 | Payer: PPO | Admitting: Gastroenterology

## 2017-03-02 ENCOUNTER — Encounter (HOSPITAL_COMMUNITY): Payer: Self-pay

## 2017-03-02 DIAGNOSIS — G4733 Obstructive sleep apnea (adult) (pediatric): Secondary | ICD-10-CM | POA: Diagnosis not present

## 2017-03-02 SURGERY — MANOMETRY, ESOPHAGUS
Anesthesia: Choice

## 2017-03-07 MED ORDER — CEFAZOLIN SODIUM-DEXTROSE 2-4 GM/100ML-% IV SOLN
2.0000 g | INTRAVENOUS | Status: AC
  Administered 2017-03-08: 2 g via INTRAVENOUS
  Filled 2017-03-07: qty 100

## 2017-03-07 MED ORDER — SODIUM CHLORIDE 0.9 % IV SOLN
1000.0000 mg | INTRAVENOUS | Status: AC
  Administered 2017-03-08: 1000 mg via INTRAVENOUS
  Filled 2017-03-07: qty 1100

## 2017-03-08 ENCOUNTER — Inpatient Hospital Stay (HOSPITAL_COMMUNITY): Payer: PPO

## 2017-03-08 ENCOUNTER — Inpatient Hospital Stay (HOSPITAL_COMMUNITY)
Admission: RE | Admit: 2017-03-08 | Discharge: 2017-03-11 | DRG: 470 | Disposition: A | Discharge status: Home Health | Payer: PPO | Attending: Orthopaedic Surgery | Admitting: Orthopaedic Surgery

## 2017-03-08 ENCOUNTER — Inpatient Hospital Stay (HOSPITAL_COMMUNITY): Payer: PPO | Admitting: Emergency Medicine

## 2017-03-08 ENCOUNTER — Encounter (HOSPITAL_COMMUNITY)
Admission: RE | Disposition: A | Discharge status: Home Health | Payer: Self-pay | Source: Home / Self Care | Attending: Orthopaedic Surgery

## 2017-03-08 ENCOUNTER — Encounter (HOSPITAL_COMMUNITY): Payer: Self-pay

## 2017-03-08 ENCOUNTER — Inpatient Hospital Stay (HOSPITAL_COMMUNITY): Payer: PPO | Admitting: Certified Registered"

## 2017-03-08 DIAGNOSIS — Z881 Allergy status to other antibiotic agents status: Secondary | ICD-10-CM | POA: Diagnosis not present

## 2017-03-08 DIAGNOSIS — G43909 Migraine, unspecified, not intractable, without status migrainosus: Secondary | ICD-10-CM | POA: Diagnosis not present

## 2017-03-08 DIAGNOSIS — Z951 Presence of aortocoronary bypass graft: Secondary | ICD-10-CM

## 2017-03-08 DIAGNOSIS — Z79899 Other long term (current) drug therapy: Secondary | ICD-10-CM

## 2017-03-08 DIAGNOSIS — G4733 Obstructive sleep apnea (adult) (pediatric): Secondary | ICD-10-CM | POA: Diagnosis not present

## 2017-03-08 DIAGNOSIS — Z8551 Personal history of malignant neoplasm of bladder: Secondary | ICD-10-CM

## 2017-03-08 DIAGNOSIS — Z7982 Long term (current) use of aspirin: Secondary | ICD-10-CM | POA: Diagnosis not present

## 2017-03-08 DIAGNOSIS — E785 Hyperlipidemia, unspecified: Secondary | ICD-10-CM | POA: Diagnosis present

## 2017-03-08 DIAGNOSIS — I2510 Atherosclerotic heart disease of native coronary artery without angina pectoris: Secondary | ICD-10-CM | POA: Diagnosis present

## 2017-03-08 DIAGNOSIS — J449 Chronic obstructive pulmonary disease, unspecified: Secondary | ICD-10-CM | POA: Diagnosis not present

## 2017-03-08 DIAGNOSIS — I10 Essential (primary) hypertension: Secondary | ICD-10-CM | POA: Diagnosis not present

## 2017-03-08 DIAGNOSIS — Z09 Encounter for follow-up examination after completed treatment for conditions other than malignant neoplasm: Secondary | ICD-10-CM

## 2017-03-08 DIAGNOSIS — J432 Centrilobular emphysema: Secondary | ICD-10-CM

## 2017-03-08 DIAGNOSIS — I739 Peripheral vascular disease, unspecified: Secondary | ICD-10-CM | POA: Diagnosis not present

## 2017-03-08 DIAGNOSIS — E538 Deficiency of other specified B group vitamins: Secondary | ICD-10-CM | POA: Diagnosis present

## 2017-03-08 DIAGNOSIS — M1612 Unilateral primary osteoarthritis, left hip: Secondary | ICD-10-CM | POA: Diagnosis not present

## 2017-03-08 DIAGNOSIS — Z87891 Personal history of nicotine dependence: Secondary | ICD-10-CM | POA: Diagnosis not present

## 2017-03-08 DIAGNOSIS — K5790 Diverticulosis of intestine, part unspecified, without perforation or abscess without bleeding: Secondary | ICD-10-CM | POA: Diagnosis not present

## 2017-03-08 DIAGNOSIS — Z885 Allergy status to narcotic agent status: Secondary | ICD-10-CM | POA: Diagnosis not present

## 2017-03-08 DIAGNOSIS — Z471 Aftercare following joint replacement surgery: Secondary | ICD-10-CM | POA: Diagnosis not present

## 2017-03-08 DIAGNOSIS — Z96642 Presence of left artificial hip joint: Secondary | ICD-10-CM | POA: Diagnosis not present

## 2017-03-08 HISTORY — PX: TOTAL HIP ARTHROPLASTY: SHX124

## 2017-03-08 HISTORY — DX: Prediabetes: R73.03

## 2017-03-08 LAB — GLUCOSE, CAPILLARY: Glucose-Capillary: 98 mg/dL (ref 65–99)

## 2017-03-08 SURGERY — ARTHROPLASTY, HIP, TOTAL, ANTERIOR APPROACH
Anesthesia: Spinal | Site: Hip | Laterality: Left

## 2017-03-08 MED ORDER — ACETAMINOPHEN 325 MG PO TABS
650.0000 mg | ORAL_TABLET | Freq: Four times a day (QID) | ORAL | Status: DC | PRN
  Administered 2017-03-08 – 2017-03-10 (×4): 650 mg via ORAL
  Filled 2017-03-08 (×4): qty 2

## 2017-03-08 MED ORDER — CEFAZOLIN SODIUM-DEXTROSE 1-4 GM/50ML-% IV SOLN
1.0000 g | Freq: Four times a day (QID) | INTRAVENOUS | Status: AC
  Administered 2017-03-08 – 2017-03-09 (×2): 1 g via INTRAVENOUS
  Filled 2017-03-08 (×2): qty 50

## 2017-03-08 MED ORDER — HYDROMORPHONE HCL 1 MG/ML IJ SOLN
INTRAMUSCULAR | Status: AC
  Administered 2017-03-08: 0.5 mg via INTRAVENOUS
  Filled 2017-03-08: qty 1

## 2017-03-08 MED ORDER — ALUM & MAG HYDROXIDE-SIMETH 200-200-20 MG/5ML PO SUSP: 30.0000 mL | ORAL | Status: DC | PRN

## 2017-03-08 MED ORDER — PROPOFOL 10 MG/ML IV BOLUS
INTRAVENOUS | Status: AC
  Filled 2017-03-08: qty 20

## 2017-03-08 MED ORDER — ONDANSETRON HCL 4 MG/2ML IJ SOLN
INTRAMUSCULAR | Status: DC | PRN
  Administered 2017-03-08: 4 mg via INTRAVENOUS

## 2017-03-08 MED ORDER — OXYCODONE HCL 5 MG PO TABS
ORAL_TABLET | ORAL | Status: AC
  Filled 2017-03-08: qty 2

## 2017-03-08 MED ORDER — FENTANYL CITRATE (PF) 250 MCG/5ML IJ SOLN
INTRAMUSCULAR | Status: DC | PRN
  Administered 2017-03-08: 50 ug via INTRAVENOUS
  Administered 2017-03-08: 25 ug via INTRAVENOUS
  Administered 2017-03-08: 50 ug via INTRAVENOUS

## 2017-03-08 MED ORDER — PHENOL 1.4 % MT LIQD: 1.0000 | OROMUCOSAL | Status: DC | PRN

## 2017-03-08 MED ORDER — METHOCARBAMOL 500 MG PO TABS
500.0000 mg | ORAL_TABLET | Freq: Four times a day (QID) | ORAL | Status: DC | PRN
  Administered 2017-03-08 – 2017-03-10 (×6): 500 mg via ORAL
  Filled 2017-03-08 (×5): qty 1

## 2017-03-08 MED ORDER — MIDAZOLAM HCL 5 MG/5ML IJ SOLN
INTRAMUSCULAR | Status: DC | PRN
  Administered 2017-03-08 (×2): 1 mg via INTRAVENOUS

## 2017-03-08 MED ORDER — ONDANSETRON HCL 4 MG/2ML IJ SOLN: 4.0000 mg | Freq: Four times a day (QID) | INTRAMUSCULAR | Status: DC | PRN

## 2017-03-08 MED ORDER — METHOCARBAMOL 500 MG PO TABS
ORAL_TABLET | ORAL | Status: AC
  Filled 2017-03-08: qty 1

## 2017-03-08 MED ORDER — SODIUM CHLORIDE 0.9 % IV SOLN
INTRAVENOUS | Status: DC
  Administered 2017-03-08 – 2017-03-10 (×3): via INTRAVENOUS

## 2017-03-08 MED ORDER — ASPIRIN EC 325 MG PO TBEC
325.0000 mg | DELAYED_RELEASE_TABLET | Freq: Two times a day (BID) | ORAL | Status: DC
  Administered 2017-03-09 – 2017-03-11 (×5): 325 mg via ORAL
  Filled 2017-03-08 (×5): qty 1

## 2017-03-08 MED ORDER — PHENYLEPHRINE HCL 10 MG/ML IJ SOLN
INTRAVENOUS | Status: DC | PRN
  Administered 2017-03-08: 20 ug/min via INTRAVENOUS

## 2017-03-08 MED ORDER — METOCLOPRAMIDE HCL 5 MG PO TABS: 5.0000 mg | ORAL_TABLET | Freq: Three times a day (TID) | ORAL | Status: DC | PRN

## 2017-03-08 MED ORDER — ALBUTEROL SULFATE (2.5 MG/3ML) 0.083% IN NEBU: 2.5000 mg | INHALATION_SOLUTION | Freq: Four times a day (QID) | RESPIRATORY_TRACT | Status: DC | PRN

## 2017-03-08 MED ORDER — FAMOTIDINE 20 MG PO TABS
20.0000 mg | ORAL_TABLET | Freq: Two times a day (BID) | ORAL | Status: DC
  Administered 2017-03-08 – 2017-03-11 (×6): 20 mg via ORAL
  Filled 2017-03-08 (×6): qty 1

## 2017-03-08 MED ORDER — 0.9 % SODIUM CHLORIDE (POUR BTL) OPTIME
TOPICAL | Status: DC | PRN
  Administered 2017-03-08: 1000 mL

## 2017-03-08 MED ORDER — FLUTICASONE FUROATE-VILANTEROL 100-25 MCG/INH IN AEPB
1.0000 | INHALATION_SPRAY | Freq: Every day | RESPIRATORY_TRACT | Status: DC
  Administered 2017-03-09 – 2017-03-11 (×3): 1 via RESPIRATORY_TRACT
  Filled 2017-03-08: qty 28

## 2017-03-08 MED ORDER — ONDANSETRON HCL 4 MG/2ML IJ SOLN: 4.0000 mg | Freq: Once | INTRAMUSCULAR | Status: DC | PRN

## 2017-03-08 MED ORDER — POLYETHYLENE GLYCOL 3350 17 G PO PACK: 17.0000 g | PACK | Freq: Every day | ORAL | Status: DC | PRN

## 2017-03-08 MED ORDER — BUPIVACAINE IN DEXTROSE 0.75-8.25 % IT SOLN
INTRATHECAL | Status: DC | PRN
  Administered 2017-03-08: 12 mg via INTRATHECAL

## 2017-03-08 MED ORDER — LACTATED RINGERS IV SOLN
INTRAVENOUS | Status: DC | PRN
  Administered 2017-03-08 (×2): via INTRAVENOUS

## 2017-03-08 MED ORDER — ATORVASTATIN CALCIUM 20 MG PO TABS
20.0000 mg | ORAL_TABLET | Freq: Every day | ORAL | Status: DC
  Administered 2017-03-09 – 2017-03-10 (×2): 20 mg via ORAL
  Filled 2017-03-08 (×2): qty 1

## 2017-03-08 MED ORDER — METOPROLOL TARTRATE 12.5 MG HALF TABLET
12.5000 mg | ORAL_TABLET | Freq: Two times a day (BID) | ORAL | Status: DC
  Administered 2017-03-08 – 2017-03-11 (×6): 12.5 mg via ORAL
  Filled 2017-03-08 (×6): qty 1

## 2017-03-08 MED ORDER — HYDROMORPHONE HCL 1 MG/ML IJ SOLN
0.2500 mg | INTRAMUSCULAR | Status: DC | PRN
  Administered 2017-03-08 (×4): 0.5 mg via INTRAVENOUS

## 2017-03-08 MED ORDER — METHOCARBAMOL 1000 MG/10ML IJ SOLN
500.0000 mg | Freq: Four times a day (QID) | INTRAVENOUS | Status: DC | PRN
  Filled 2017-03-08: qty 5

## 2017-03-08 MED ORDER — FENTANYL CITRATE (PF) 100 MCG/2ML IJ SOLN
INTRAMUSCULAR | Status: AC
  Administered 2017-03-08: 50 ug via INTRAVENOUS
  Filled 2017-03-08: qty 2

## 2017-03-08 MED ORDER — MIDAZOLAM HCL 2 MG/2ML IJ SOLN
INTRAMUSCULAR | Status: AC
  Filled 2017-03-08: qty 2

## 2017-03-08 MED ORDER — DIPHENHYDRAMINE HCL 12.5 MG/5ML PO ELIX: 12.5000 mg | ORAL_SOLUTION | ORAL | Status: DC | PRN

## 2017-03-08 MED ORDER — ZOLPIDEM TARTRATE 5 MG PO TABS: 5.0000 mg | ORAL_TABLET | Freq: Every evening | ORAL | Status: DC | PRN

## 2017-03-08 MED ORDER — PROPOFOL 500 MG/50ML IV EMUL
INTRAVENOUS | Status: DC | PRN
  Administered 2017-03-08: 75 ug/kg/min via INTRAVENOUS

## 2017-03-08 MED ORDER — MENTHOL 3 MG MT LOZG: 1.0000 | LOZENGE | OROMUCOSAL | Status: DC | PRN

## 2017-03-08 MED ORDER — ACETAMINOPHEN 650 MG RE SUPP: 650.0000 mg | Freq: Four times a day (QID) | RECTAL | Status: DC | PRN

## 2017-03-08 MED ORDER — FENTANYL CITRATE (PF) 250 MCG/5ML IJ SOLN
INTRAMUSCULAR | Status: AC
  Filled 2017-03-08: qty 5

## 2017-03-08 MED ORDER — AMLODIPINE BESYLATE 5 MG PO TABS
5.0000 mg | ORAL_TABLET | Freq: Every day | ORAL | Status: DC
  Administered 2017-03-09 – 2017-03-11 (×3): 5 mg via ORAL
  Filled 2017-03-08 (×4): qty 1

## 2017-03-08 MED ORDER — MEPERIDINE HCL 25 MG/ML IJ SOLN: 6.2500 mg | INTRAMUSCULAR | Status: DC | PRN

## 2017-03-08 MED ORDER — METOCLOPRAMIDE HCL 5 MG/ML IJ SOLN: 5.0000 mg | Freq: Three times a day (TID) | INTRAMUSCULAR | Status: DC | PRN

## 2017-03-08 MED ORDER — HYDROMORPHONE HCL 1 MG/ML IJ SOLN
1.0000 mg | INTRAMUSCULAR | Status: DC | PRN
  Administered 2017-03-09 (×2): 1 mg via INTRAVENOUS
  Filled 2017-03-08 (×2): qty 1

## 2017-03-08 MED ORDER — UMECLIDINIUM-VILANTEROL 62.5-25 MCG/INH IN AEPB: 1.0000 | INHALATION_SPRAY | Freq: Every day | RESPIRATORY_TRACT | Status: DC

## 2017-03-08 MED ORDER — ONDANSETRON HCL 4 MG PO TABS: 4.0000 mg | ORAL_TABLET | Freq: Four times a day (QID) | ORAL | Status: DC | PRN

## 2017-03-08 MED ORDER — SODIUM CHLORIDE 0.9 % IR SOLN
Status: DC | PRN
  Administered 2017-03-08: 3000 mL

## 2017-03-08 MED ORDER — LACTATED RINGERS IV SOLN
INTRAVENOUS | Status: DC
  Administered 2017-03-08: 12:00:00 via INTRAVENOUS

## 2017-03-08 MED ORDER — OXYCODONE HCL 5 MG PO TABS
5.0000 mg | ORAL_TABLET | ORAL | Status: DC | PRN
  Administered 2017-03-08: 10 mg via ORAL
  Administered 2017-03-08 – 2017-03-09 (×4): 15 mg via ORAL
  Administered 2017-03-09: 10 mg via ORAL
  Administered 2017-03-09 – 2017-03-10 (×5): 15 mg via ORAL
  Administered 2017-03-11: 10 mg via ORAL
  Administered 2017-03-11: 15 mg via ORAL
  Filled 2017-03-08 (×11): qty 3
  Filled 2017-03-08: qty 2

## 2017-03-08 MED ORDER — DOCUSATE SODIUM 100 MG PO CAPS
100.0000 mg | ORAL_CAPSULE | Freq: Two times a day (BID) | ORAL | Status: DC
  Administered 2017-03-08 – 2017-03-11 (×6): 100 mg via ORAL
  Filled 2017-03-08 (×6): qty 1

## 2017-03-08 MED ORDER — CHLORHEXIDINE GLUCONATE 4 % EX LIQD: 60.0000 mL | Freq: Once | CUTANEOUS | Status: DC

## 2017-03-08 MED ORDER — FENTANYL CITRATE (PF) 100 MCG/2ML IJ SOLN
25.0000 ug | INTRAMUSCULAR | Status: DC | PRN
  Administered 2017-03-08 (×3): 50 ug via INTRAVENOUS

## 2017-03-08 MED ORDER — LIDOCAINE HCL (CARDIAC) 20 MG/ML IV SOLN
INTRAVENOUS | Status: DC | PRN
  Administered 2017-03-08: 100 mg via INTRAVENOUS

## 2017-03-08 MED ORDER — PROPOFOL 1000 MG/100ML IV EMUL
INTRAVENOUS | Status: AC
  Filled 2017-03-08: qty 200

## 2017-03-08 SURGICAL SUPPLY — 54 items
BENZOIN TINCTURE PRP APPL 2/3 (GAUZE/BANDAGES/DRESSINGS) ×3 IMPLANT
BLADE CLIPPER SURG (BLADE) IMPLANT
BLADE SAW SGTL 18X1.27X75 (BLADE) ×2 IMPLANT
BLADE SAW SGTL 18X1.27X75MM (BLADE) ×1
CAPT HIP TOTAL 2 ×3 IMPLANT
CELLS DAT CNTRL 66122 CELL SVR (MISCELLANEOUS) ×1 IMPLANT
CLOSURE STERI-STRIP 1/2X4 (GAUZE/BANDAGES/DRESSINGS) ×1
CLOSURE WOUND 1/2 X4 (GAUZE/BANDAGES/DRESSINGS) ×2
CLSR STERI-STRIP ANTIMIC 1/2X4 (GAUZE/BANDAGES/DRESSINGS) ×2 IMPLANT
COVER SURGICAL LIGHT HANDLE (MISCELLANEOUS) ×3 IMPLANT
DRAPE C-ARM 42X72 X-RAY (DRAPES) ×3 IMPLANT
DRAPE STERI IOBAN 125X83 (DRAPES) ×3 IMPLANT
DRAPE U-SHAPE 47X51 STRL (DRAPES) ×9 IMPLANT
DRSG AQUACEL AG ADV 3.5X10 (GAUZE/BANDAGES/DRESSINGS) ×3 IMPLANT
DRSG AQUACEL AG ADV 3.5X14 (GAUZE/BANDAGES/DRESSINGS) ×3 IMPLANT
DURAPREP 26ML APPLICATOR (WOUND CARE) ×3 IMPLANT
ELECT BLADE 4.0 EZ CLEAN MEGAD (MISCELLANEOUS) ×3
ELECT BLADE 6.5 EXT (BLADE) IMPLANT
ELECT REM PT RETURN 9FT ADLT (ELECTROSURGICAL) ×3
ELECTRODE BLDE 4.0 EZ CLN MEGD (MISCELLANEOUS) ×1 IMPLANT
ELECTRODE REM PT RTRN 9FT ADLT (ELECTROSURGICAL) ×1 IMPLANT
FACESHIELD WRAPAROUND (MASK) ×6 IMPLANT
GLOVE BIOGEL PI IND STRL 8 (GLOVE) ×2 IMPLANT
GLOVE BIOGEL PI INDICATOR 8 (GLOVE) ×4
GLOVE ECLIPSE 8.0 STRL XLNG CF (GLOVE) ×3 IMPLANT
GLOVE ORTHO TXT STRL SZ7.5 (GLOVE) ×6 IMPLANT
GOWN STRL REUS W/ TWL LRG LVL3 (GOWN DISPOSABLE) ×2 IMPLANT
GOWN STRL REUS W/ TWL XL LVL3 (GOWN DISPOSABLE) ×2 IMPLANT
GOWN STRL REUS W/TWL LRG LVL3 (GOWN DISPOSABLE) ×4
GOWN STRL REUS W/TWL XL LVL3 (GOWN DISPOSABLE) ×4
HANDPIECE INTERPULSE COAX TIP (DISPOSABLE) ×2
KIT BASIN OR (CUSTOM PROCEDURE TRAY) ×3 IMPLANT
KIT ROOM TURNOVER OR (KITS) ×3 IMPLANT
MANIFOLD NEPTUNE II (INSTRUMENTS) ×3 IMPLANT
NS IRRIG 1000ML POUR BTL (IV SOLUTION) ×3 IMPLANT
PACK TOTAL JOINT (CUSTOM PROCEDURE TRAY) ×3 IMPLANT
PAD ARMBOARD 7.5X6 YLW CONV (MISCELLANEOUS) ×3 IMPLANT
RTRCTR WOUND ALEXIS 18CM MED (MISCELLANEOUS) ×3
SET HNDPC FAN SPRY TIP SCT (DISPOSABLE) ×1 IMPLANT
STAPLER VISISTAT 35W (STAPLE) IMPLANT
STRIP CLOSURE SKIN 1/2X4 (GAUZE/BANDAGES/DRESSINGS) ×4 IMPLANT
SUT ETHIBOND NAB CT1 #1 30IN (SUTURE) ×3 IMPLANT
SUT MNCRL AB 4-0 PS2 18 (SUTURE) IMPLANT
SUT VIC AB 0 CT1 27 (SUTURE) ×2
SUT VIC AB 0 CT1 27XBRD ANBCTR (SUTURE) ×1 IMPLANT
SUT VIC AB 1 CT1 27 (SUTURE) ×2
SUT VIC AB 1 CT1 27XBRD ANBCTR (SUTURE) ×1 IMPLANT
SUT VIC AB 2-0 CT1 27 (SUTURE) ×2
SUT VIC AB 2-0 CT1 TAPERPNT 27 (SUTURE) ×1 IMPLANT
TOWEL OR 17X24 6PK STRL BLUE (TOWEL DISPOSABLE) ×3 IMPLANT
TOWEL OR 17X26 10 PK STRL BLUE (TOWEL DISPOSABLE) ×3 IMPLANT
TRAY CATH 16FR W/PLASTIC CATH (SET/KITS/TRAYS/PACK) IMPLANT
TRAY FOLEY W/METER SILVER 16FR (SET/KITS/TRAYS/PACK) IMPLANT
WATER STERILE IRR 1000ML POUR (IV SOLUTION) ×6 IMPLANT

## 2017-03-08 NOTE — Anesthesia Procedure Notes (Signed)
Spinal  Patient location during procedure: OR Start time: 03/08/2017 1:42 PM End time: 03/08/2017 1:50 PM Staffing Anesthesiologist: Dawson Carmack Preanesthetic Checklist Completed: patient identified, site marked, surgical consent, pre-op evaluation, timeout performed, IV checked, risks and benefits discussed and monitors and equipment checked Spinal Block Patient position: sitting Prep: DuraPrep Patient monitoring: heart rate, cardiac monitor, continuous pulse ox and blood pressure Approach: midline Location: L3-4 Injection technique: single-shot Needle Needle type: Sprotte  Needle gauge: 24 G Needle length: 9 cm Assessment Sensory level: T4

## 2017-03-08 NOTE — Anesthesia Preprocedure Evaluation (Signed)
Anesthesia Evaluation  Patient identified by MRN, date of birth, ID band Patient awake    Reviewed: Allergy & Precautions, H&P , NPO status , Patient's Chart, lab work & pertinent test results, reviewed documented beta blocker date and time   Airway Mallampati: III  TM Distance: >3 FB Neck ROM: Full    Dental no notable dental hx. (+) Teeth Intact, Dental Advisory Given   Pulmonary neg pulmonary ROS, asthma , sleep apnea , COPD, former smoker,    Pulmonary exam normal breath sounds clear to auscultation       Cardiovascular hypertension, On Medications + CAD and + Peripheral Vascular Disease   Rhythm:Regular Rate:Normal     Neuro/Psych  Headaches, negative psych ROS   GI/Hepatic negative GI ROS, Neg liver ROS,   Endo/Other  negative endocrine ROS  Renal/GU Renal diseasenegative Renal ROS  negative genitourinary   Musculoskeletal  (+) Arthritis , Osteoarthritis,    Abdominal   Peds  Hematology negative hematology ROS (+)   Anesthesia Other Findings CATH 8/18 Mild LV dysfunction with an ejection fraction of 45-50% with mild mid to basal inferior hypocontractility and a nipple appearing focal apical aneurysm.  Significant native CAD with total occlusion of the proximal LAD after a prominent septal perforating artery; 55% stenosis prior to an ectatic aneurysmal segment in the proximal circumflex 20% mild narrowing between the OM1 and OM 2 with retrograde filling into the OM 2 via the RIMA graft; and total occlusion of the proximal RCA with bridging collaterals to the mid RCA and collaterals arising from the distal circumflex and septal perforating artery of the LAD supplying the very distal branch vessels.  Patent LIMA graft supplying the mid LAD.  Patent RIMA graft supplying the OM 2 vessel of the circumflex  Old occluded vein graft, which had supplied the PDA region.  RECOMMENDATION: Medical therapy.  The  patient will return to the care of Dr. Roosvelt Harps in [place].  Based on his anatomy, which essentially is unchanged from 2 years ago,  cardiac clearance can be given for his hip surgery.   Reproductive/Obstetrics negative OB ROS                             Anesthesia Physical  Anesthesia Plan  ASA: III  Anesthesia Plan: Spinal   Post-op Pain Management:  Regional for Post-op pain   Induction:   PONV Risk Score and Plan: 2 and Ondansetron, Dexamethasone, Treatment may vary due to age or medical condition and Midazolam  Airway Management Planned: Nasal Cannula and Natural Airway  Additional Equipment:   Intra-op Plan:   Post-operative Plan:   Informed Consent: I have reviewed the patients History and Physical, chart, labs and discussed the procedure including the risks, benefits and alternatives for the proposed anesthesia with the patient or authorized representative who has indicated his/her understanding and acceptance.   Dental advisory given  Plan Discussed with: CRNA, Anesthesiologist and Surgeon  Anesthesia Plan Comments:         Anesthesia Quick Evaluation

## 2017-03-08 NOTE — Brief Op Note (Signed)
03/08/2017  2:56 PM  PATIENT:  Carl Lee  61 y.o. male  PRE-OPERATIVE DIAGNOSIS:  left hip osteoarthritis  POST-OPERATIVE DIAGNOSIS:  left hip osteoarthritis  PROCEDURE:  Procedure(s): LEFT TOTAL HIP ARTHROPLASTY ANTERIOR APPROACH (Left)  SURGEON:  Surgeon(s) and Role:    [name], MD - Primary  PHYSICIAN ASSISTANT: [name], PA-C  ANESTHESIA:   spinal  EBL:  Total I/O In: 1000 [I.V.:1000] Out: 250 [Blood:250]  COUNTS:  YES  DICTATION: .Other Dictation: Dictation Number [id number]  PLAN OF CARE: Admit to inpatient   PATIENT DISPOSITION:  PACU - hemodynamically stable.   Delay start of Pharmacological VTE agent (>24hrs) due to surgical blood loss or risk of bleeding: no

## 2017-03-08 NOTE — Progress Notes (Signed)
Pt feels better after in & out cath.

## 2017-03-08 NOTE — Progress Notes (Signed)
Pt up in recliner, feels he has to void, bladder scan shows 698cc. Pt still c/o 10/10 left hip pain after meds. Dr Marcie Bal fully updated-new order for in & out cath, as pt feels if his bladder were emptied, his pain would be better. Wife at bedside & fully updated as well.

## 2017-03-08 NOTE — Transfer of Care (Signed)
Immediate Anesthesia Transfer of Care Note  Patient: Carl Lee  Procedure(s) Performed: Procedure(s): LEFT TOTAL HIP ARTHROPLASTY ANTERIOR APPROACH (Left)  Patient Location: PACU  Anesthesia Type:MAC and Regional  Level of Consciousness: awake and alert   Airway & Oxygen Therapy: Patient Spontanous Breathing  Post-op Assessment: Report given to RN  Post vital signs: Reviewed and stable  Last Vitals:  Vitals:   03/08/17 1124  BP: 121/74  Pulse: 65  Resp: 18  Temp: 36.8 C  SpO2: 97%    Last Pain:  Vitals:   03/08/17 1203  TempSrc:   PainSc: 8          Complications: No apparent anesthesia complications

## 2017-03-08 NOTE — H&P (Signed)
TOTAL HIP ADMISSION H&P  Patient is admitted for left total hip arthroplasty.  Subjective:  Chief Complaint: left hip pain  HPI: Carl Lee, 61 y.o. male, has a history of pain and functional disability in the left hip(s) due to arthritis and patient has failed non-surgical conservative treatments for greater than 12 weeks to include NSAID's and/or analgesics, corticosteriod injections, flexibility and strengthening excercises, weight reduction as appropriate and activity modification.  Onset of symptoms was gradual starting 1 years ago with gradually worsening course since that time.The patient noted no past surgery on the left hip(s).  Patient currently rates pain in the left hip at 10 out of 10 with activity. Patient has night pain, worsening of pain with activity and weight bearing, trendelenberg gait, pain that interfers with activities of daily living and pain with passive range of motion. Patient has evidence of subchondral sclerosis, periarticular osteophytes and joint space narrowing by imaging studies. This condition presents safety issues increasing the risk of falls.  There is no current active infection.  Patient Active Problem List   Diagnosis Date Noted  . Abnormal nuclear stress test   . Overweight (BMI 25.0-29.9) 01/26/2017  . Radiculopathy due to lumbar intervertebral disc disorder 10/21/2016  . Osteoarthritis of left hip 08/16/2016  . Atrial tachycardia, paroxysmal ([place]) 06/18/2016  . Chronic fatigue 04/27/2016  . Coronary artery disease   . Abdominal discomfort 06/24/2015  . Hepatic steatosis 06/12/2015  . Right trigeminal neuralgia 11/27/2014  . Chest pain 10/30/2014  . Vitamin B12 deficiency   . OSA (obstructive sleep apnea) 09/04/2014  . Headache 06/20/2014  . Prediabetes 06/08/2014  . Dysphagia 04/19/2014  . Zenker's diverticulum 04/19/2014  . COPD, moderate ([place]) 03/26/2014  . S/P CABG x 3 02/07/2014  . Occlusion and stenosis of vertebral artery without  mention of cerebral infarction 11/12/2013  . Skin rash 04/17/2013  . Healthcare maintenance 01/09/2013  . Dyslipidemia 01/09/2013  . Lumbosacral radiculopathy at S1 09/14/2012  . Hx of migraines   . Arthritis   . History of repair of aneurysm of abdominal aorta using endovascular stent graft 05/01/2012  . Adenomatous polyps 03/24/2012  . Ex-smoker 03/24/2012  . Hypertension 03/24/2012  . Bladder cancer ([place]) 03/24/2012   Past Medical History:  Diagnosis Date  . AAA (abdominal aortic aneurysm) ([place]) 03/25/12   s/p endovascular repair  . Adenomatous polyp 06/2009  . Atrial tachycardia, paroxysmal ([place]) 06/18/2016  . Bladder cancer ([place]) 08/16/2008   recurrence 2015 and 2018 treated with office fulguration  . CAD (coronary artery disease), native coronary artery    S/p 3v CABG 01/2014   . Childhood asthma    as a child  . Complication of anesthesia    "takes me a year to get over"  . Contact dermatitis    atypical [name])  . COPD (chronic obstructive pulmonary disease) ([place])   . Coronary artery disease    a.2015: CABG w/ LIMA-LAD, RIMA-OM, SVG-RCA b. Cath on 07/10/15 w/ patent LIMA-LAD and RIMA-OM. SVG-RCA occluded but collaterals present  . Diverticulosis 2013   severe by CT and colonoscopy  . Environmental allergies    improved as ages  . Fracture of lateral malleolus of left ankle 08/29/2013  . Headache    hasn't had migraines in years  . Hepatic steatosis 06/2015   by [place]  . Hx of migraines   . Hypertension   . Lumbosacral radiculopathy at S1 03/2012   left, with spinal stenosis (MRI 04/2013) improved with TF ESI L5/S1 and S1/2 ([place])  . OSA (obstructive sleep apnea) 09/04/2014   Severe with AHI 35/hr, uses BIPAP  . Overweight (BMI 25.0-29.9) 01/26/2017  . Prediabetes 06/08/2014   CAD   . Resistance to clopidogrel 2015   drug metabolism panel run - asked to scan  . Spinal stenosis    LS1 nerve root impingement from bulging disc  . Vitamin B12 deficiency    . Zenker diverticulum     Past Surgical History:  Procedure Laterality Date  . ABDOMINAL AORTIC ANEURYSM REPAIR  03/25/12   endovascular  . ABI  05/2013   WNL, L TBI low at 0.66  . Bladder cancer  March 2010 and Oct. 2015   Ernst Spell) x 2  . CARDIAC CATHETERIZATION N/A 07/10/2015   Procedure: Left Heart Cath and Cors/Grafts Angiography;  Surgeon: [name], MD;  Location: [facility];  Service: Cardiovascular;  Laterality: N/A;  . COLONOSCOPY  06/2012   hyperplastic polyp, diverticulosis ([place]) rec rpt 5 yrs  . CORONARY ARTERY BYPASS GRAFT N/A 02/07/2014   Procedure: CORONARY ARTERY BYPASS GRAFTING (CABG);  Surgeon: [name], MD;  Location: [facility];  Service: Open Heart Surgery;  Laterality: N/A;  CABG X 3, BILATERAL LIMA, EVH  . CYSTOSCOPY  08/16/08   Bladder Cancer  . EPIDURAL BLOCK INJECTION Left 09/2014, 10/2014, 12/2014   medial L2,3,4, dorsal L5 ramus blocks x2, L L5/S1 and S1/2 transforaminal ESI ([place])  . ESI  04/2013, 06/2013   L L5S1, S12 transforaminal ESI (Dr.  [name])  . ESI Left 04/2014, 05/2014, 06/2014   L5/S1, S1/2; rpt; L4/5  . ESI  03/2016   R L5/S1 interlaminar ESI  . FULGURATION OF BLADDER TUMOR  01/2017   recurrent 39mm L lateral wall papillary transitional cell carcinoma ([place])  . INTRAOPERATIVE TRANSESOPHAGEAL ECHOCARDIOGRAM N/A 02/07/2014   Procedure: INTRAOPERATIVE TRANSESOPHAGEAL ECHOCARDIOGRAM;  Surgeon: [name], MD;  Location: [facility];  Service: Open Heart Surgery;  Laterality: N/A;  . LEFT HEART CATH AND CORS/GRAFTS ANGIOGRAPHY N/A 02/11/2017   Procedure: LEFT HEART CATH AND CORS/GRAFTS ANGIOGRAPHY;  Surgeon: [name], MD;  Location: [facility];  Service: Cardiovascular;  Laterality: N/A;  . LEFT HEART CATHETERIZATION WITH CORONARY ANGIOGRAM N/A 02/01/2014   Procedure: LEFT HEART CATHETERIZATION WITH CORONARY ANGIOGRAM;  Surgeon: [name], MD;  Location: [facility];  Service:  Cardiovascular;  Laterality: N/A;  . PRP epidural injection Left 11/2015   L5/S1, S1/2 transforaminal epidural PRP injections under fluoroscopy ([place])  . TONSILLECTOMY AND ADENOIDECTOMY  1973  . VASECTOMY      Facility-Administered Medications Prior to Admission  Medication Dose Route Frequency Provider Last Rate Last Dose  . umeclidinium-vilanterol (ANORO ELLIPTA) 62.5-25 MCG/INH 1 puff  1 puff Inhalation Daily Juanito Doom, MD       Prescriptions Prior to Admission  Medication Sig Dispense Refill Last Dose  . albuterol (PROAIR HFA) 108 (90 Base) MCG/ACT inhaler Inhale 2 puffs into the lungs every 6 (six) hours as needed for wheezing or shortness of breath. 1 Inhaler 2 Taking  . amLODipine (NORVASC) 5 MG tablet Take 1 tablet (5 mg total) by mouth daily. 90 tablet 3 Taking  . aspirin EC 325 MG EC tablet Take 1 tablet (325 mg total) by mouth daily.   Taking  . atorvastatin (LIPITOR) 20 MG tablet TAKE 1 TABLET (20 MG TOTAL) BY MOUTH DAILY AT 6 PM. 90 tablet 3 Taking  . famotidine (PEPCID) 20 MG tablet Take 20 mg by  mouth 2 (two) times daily.    Taking  . fluticasone furoate-vilanterol (BREO ELLIPTA) 100-25 MCG/INH AEPB Inhale 1 puff into the lungs daily. 60 each 6 Taking  . metoprolol tartrate (LOPRESSOR) 25 MG tablet Take 12.5 mg by mouth 2 (two) times daily.   Taking  . vitamin B-12 (CYANOCOBALAMIN) 500 MCG tablet Take 1 tablet (500 mcg total) by mouth daily. (Patient taking differently: Take 500 mcg by mouth every other day. )   Taking  . oxyCODONE-acetaminophen (PERCOCET/ROXICET) 5-325 MG tablet Take 1 tablet by mouth 2 (two) times daily. 30 tablet 0    Allergies  Allergen Reactions  . Codeine Other (See Comments)    Nightmares  . Doxycycline Hives    mild    Social History  Substance Use Topics  . Smoking status: Former Smoker    Packs/day: 1.00    Years: 40.00    Types: Cigarettes    Quit date: 02/05/2014  . Smokeless tobacco: Never Used  . Alcohol use 0.0  oz/week     Comment: Rare- states 1 oz. per week    Family History  Problem Relation Age of Onset  . Diabetes Mother   . Arrhythmia Mother        pacemaker  . Cancer Mother        Bladder  . COPD Mother   . Thyroid disease Mother   . Hyperlipidemia Mother   . Hypertension Mother   . Diabetes Father   . CAD Father 5       CHF, MI  . Dementia Father   . Heart attack Father   . Diabetes Sister   . Diabetes Brother   . Hypertension Brother   . Heart attack Maternal Grandmother   . Colon cancer Neg Hx      Review of Systems  Musculoskeletal: Positive for joint pain.  All other systems reviewed and are negative.   Objective:  Physical Exam  Constitutional: He is oriented to person, place, and time. He appears well-developed and well-nourished.  HENT:  Head: Normocephalic and atraumatic.  Eyes: Pupils are equal, round, and reactive to light. EOM are normal.  Neck: Normal range of motion. Neck supple.  Cardiovascular: Normal rate.   Respiratory: Effort normal and breath sounds normal.  GI: Soft. Bowel sounds are normal.  Musculoskeletal:       Left hip: He exhibits decreased range of motion, decreased strength, tenderness and bony tenderness.  Neurological: He is alert and oriented to person, place, and time.  Skin: Skin is warm and dry.  Psychiatric: He has a normal mood and affect.    Vital signs in last 24 hours: Temp:  [98.2 F (36.8 C)] 98.2 F (36.8 C) (08/28 1124) Pulse Rate:  [65] 65 (08/28 1124) Resp:  [18] 18 (08/28 1124) BP: (121)/(74) 121/74 (08/28 1124) SpO2:  [97 %] 97 % (08/28 1124) Weight:  [205 lb 9.6 oz (93.3 kg)] 205 lb 9.6 oz (93.3 kg) (08/28 1124)  Labs:   Estimated body mass index is 30.36 kg/m as calculated from the following:   Height as of this encounter: 5\' 9"  (1.753 m).   Weight as of this encounter: 205 lb 9.6 oz (93.3 kg).   Imaging Review Plain radiographs demonstrate severe degenerative joint disease of the left hip(s). The  bone quality appears to be excellent for age and reported activity level.  Assessment/Plan:  End stage arthritis, left hip(s)  The patient history, physical examination, clinical judgement of the provider and imaging studies are consistent  with end stage degenerative joint disease of the left hip(s) and total hip arthroplasty is deemed medically necessary. The treatment options including medical management, injection therapy, arthroscopy and arthroplasty were discussed at length. The risks and benefits of total hip arthroplasty were presented and reviewed. The risks due to aseptic loosening, infection, stiffness, dislocation/subluxation,  thromboembolic complications and other imponderables were discussed.  The patient acknowledged the explanation, agreed to proceed with the plan and consent was signed. Patient is being admitted for inpatient treatment for surgery, pain control, PT, OT, prophylactic antibiotics, VTE prophylaxis, progressive ambulation and ADL's and discharge planning.The patient is planning to be discharged home with home health services

## 2017-03-09 ENCOUNTER — Encounter (HOSPITAL_COMMUNITY): Payer: Self-pay | Admitting: General Practice

## 2017-03-09 LAB — CBC
HEMATOCRIT: 39.1 % (ref 39.0–52.0)
Hemoglobin: 12.9 g/dL — ABNORMAL LOW (ref 13.0–17.0)
MCH: 31 pg (ref 26.0–34.0)
MCHC: 33 g/dL (ref 30.0–36.0)
MCV: 94 fL (ref 78.0–100.0)
Platelets: 196 10*3/uL (ref 150–400)
RBC: 4.16 MIL/uL — AB (ref 4.22–5.81)
RDW: 13.4 % (ref 11.5–15.5)
WBC: 15 10*3/uL — AB (ref 4.0–10.5)

## 2017-03-09 LAB — BASIC METABOLIC PANEL
ANION GAP: 9 (ref 5–15)
BUN: 14 mg/dL (ref 6–20)
CALCIUM: 8.5 mg/dL — AB (ref 8.9–10.3)
CO2: 26 mmol/L (ref 22–32)
Chloride: 101 mmol/L (ref 101–111)
Creatinine, Ser: 0.82 mg/dL (ref 0.61–1.24)
Glucose, Bld: 126 mg/dL — ABNORMAL HIGH (ref 65–99)
POTASSIUM: 3.8 mmol/L (ref 3.5–5.1)
Sodium: 136 mmol/L (ref 135–145)

## 2017-03-09 NOTE — Evaluation (Signed)
Physical Therapy Evaluation Patient Details Name: Carl Lee MRN: [id number] DOB: Apr 22, 1956 Today's Date: 03/09/2017   History of Present Illness  Pt admitted 8/28 for elective L direct THA. PMH: copd, s/p cabgx3, arthritis,s/p repair of AAA, OSA. - wears CPAP at night.  Clinical Impression  Pt is s/p THA resulting in the deficits listed below (see PT Problem List). Pt limited this session by L hip pain and chest pain that he reports his indigestion. BP is 109/64. Pt will benefit from skilled PT to increase their independence and safety with mobility to allow discharge to the venue listed below.   SATURATION QUALIFICATIONS: (This note is used to comply with regulatory documentation for home oxygen)  Patient Saturations on Room Air at Rest = 83%  Patient Saturations on Room Air while Ambulating = NT  Patient Saturations on 2 Liters of oxygen while Ambulating = 87%  Please briefly explain why patient needs home oxygen: can't maintain above 88%     Follow Up Recommendations Home health PT;Supervision/Assistance - 24 hour    Equipment Recommendations  Rolling walker with 5" wheels (tub bench)    Recommendations for Other Services       Precautions / Restrictions Precautions Precautions: None Precaution Comments: watch 02, decreases into 80s on RA and on 2Lo2 when amb Restrictions Weight Bearing Restrictions: Yes LLE Weight Bearing: Weight bearing as tolerated      Mobility  Bed Mobility Overal bed mobility: Modified Independent             General bed mobility comments: pt refused assist from PT, v/c's for long sit technique and to complete quad set with L LE to assist with adduction off the bed.  increaed time  Transfers Overall transfer level: Needs assistance Equipment used: Rolling walker (2 wheeled) Transfers: Sit to/from Stand Sit to Stand: Min guard         General transfer comment: v/c's for safe hand  placement, increased  time  Ambulation/Gait Ambulation/Gait assistance: Min guard Ambulation Distance (Feet): 50 Feet Assistive device: Rolling walker (2 wheeled) Gait Pattern/deviations: Step-to pattern;Decreased step length - left;Decreased step length - right;Decreased stance time - left Gait velocity: slow Gait velocity interpretation: Below normal speed for age/gender General Gait Details: pt initially unable to clear L foot, then transitioned to raising R heel to hip hike L foot, pt with minimal L LE WBing, pt required 3 standing rest breaks. pt reports "my arms are giving out" pt encouraged to incerased L LE [place]            Wheelchair Mobility    Modified Rankin (Stroke Patients Only)       Balance Overall balance assessment: Needs assistance Sitting-balance support: Feet supported;No upper extremity supported       Standing balance support: No upper extremity supported Standing balance-Leahy Scale: Fair Standing balance comment: pt able to pull up shorts in standing without difficulty                             Pertinent Vitals/Pain Pain Assessment: 0-10 Pain Score: 10-Worst pain ever Pain Location: L hip and chest - reports he has indigestion Pain Descriptors / Indicators: Sharp (sharp hip pain with mvmt, throb at rest) Pain Intervention(s): Monitored during session;Patient requesting pain meds-RN notified    Home Living Family/patient expects to be discharged to:: Private residence Living Arrangements: Spouse/significant other Available Help at Discharge: Family;Available 24 hours/day Type of Home: House Home Access:  Stairs to enter Entrance Stairs-Rails: None Entrance Stairs-Number of Steps: 2 Home Layout: One level Home Equipment: Cane - single point      Prior Function Level of Independence: Independent               Hand Dominance   Dominant Hand: Right    Extremity/Trunk Assessment   Upper Extremity Assessment Upper Extremity  Assessment: Overall WFL for tasks assessed    Lower Extremity Assessment Lower Extremity Assessment: LLE deficits/detail LLE Deficits / Details: pt able to complete quad set but painful, can initiate L hip flexion but painful    Cervical / Trunk Assessment Cervical / Trunk Assessment: Normal  Communication   Communication: No difficulties  Cognition Arousal/Alertness: Awake/alert (however sleepy, difficulty maintaining eyes open) Behavior During Therapy: Impulsive Overall Cognitive Status: Within Functional Limits for tasks assessed                                 General Comments: mild AMS from pain meds but oriented and appropriate      General Comments General comments (skin integrity, edema, etc.): pt hypersensitive at L thigh    Exercises General Exercises - Lower Extremity Ankle Circles/Pumps: AROM;Both;10 reps;Supine Quad Sets: AROM;Left;10 reps;Supine Gluteal Sets: AROM;Both;10 reps;Supine Long Arc Quad: AROM;Left;10 reps;Seated   Assessment/Plan    PT Assessment Patient needs continued PT services  PT Problem List Decreased strength;Decreased range of motion;Decreased activity tolerance;Decreased balance;Decreased mobility;Decreased knowledge of use of DME;Decreased safety awareness;Pain       PT Treatment Interventions DME instruction;Gait training;Stair training;Functional mobility training;Therapeutic activities;Therapeutic exercise;Balance training    PT Goals (Current goals can be found in the Care Plan section)  Acute Rehab PT Goals Patient Stated Goal: stop the pain PT Goal Formulation: With patient Time For Goal Achievement: 03/16/17 Potential to Achieve Goals: Good    Frequency 7X/week   Barriers to discharge        Co-evaluation               AM-PAC PT "6 Clicks" Daily Activity  Outcome Measure Difficulty turning over in bed (including adjusting bedclothes, sheets and blankets)?: Unable Difficulty moving from lying on  back to sitting on the side of the bed? : Unable Difficulty sitting down on and standing up from a chair with arms (e.g., wheelchair, bedside commode, etc,.)?: A Little Help needed moving to and from a bed to chair (including a wheelchair)?: A Little Help needed walking in hospital room?: A Little Help needed climbing 3-5 steps with a railing? : A Lot 6 Click Score: 13    End of Session Equipment Utilized During Treatment: Gait belt;Oxygen (2LO2 via [place]) Activity Tolerance: Patient tolerated treatment well Patient left: in chair;with call bell/phone within reach;with family/visitor present Nurse Communication: Mobility status;Patient requests pain meds PT Visit Diagnosis: Pain;Difficulty in walking, not elsewhere classified (R26.2) Pain - Right/Left: Left Pain - part of body: Hip    Time: 4540-9811 PT Time Calculation (min) (ACUTE ONLY): 44 min   Charges:   PT Evaluation $PT Eval Moderate Complexity: 1 Mod PT Treatments $Gait Training: 8-22 mins $Therapeutic Exercise: 8-22 mins   PT G Codes:        Kittie Plater, PT, DPT Pager #: [phone] Office #: [phone]   [place] 03/09/2017, 9:45 AM

## 2017-03-09 NOTE — Progress Notes (Signed)
Physical Therapy Treatment Note  Clinical Impression: Pt con't to be limited by L hip pain with all mobility. Pt with inability to initiate L hip flexion without assist. With max encouragement and L LE assist pt did increase amb distance today.   03/09/17 1300  PT Visit Information  Last PT Received On 03/09/17  Assistance Needed +1  History of Present Illness Pt admitted 8/28 for elective L direct THA. PMH: copd, s/p cabgx3, arthritis,s/p repair of AAA, OSA. - wears CPAP at night.  Precautions  Precautions None  Precaution Comments watch O2  Restrictions  Weight Bearing Restrictions Yes  LLE Weight Bearing WBAT  Pain Assessment  Pain Assessment 0-10  Pain Score 8  Pain Location L hip  Pain Descriptors / Indicators Sharp  Pain Intervention(s) Monitored during session  Cognition  Arousal/Alertness Awake/alert  Behavior During Therapy WFL for tasks assessed/performed  Overall Cognitive Status Within Functional Limits for tasks assessed  Bed Mobility  Overal bed mobility Modified Independent  General bed mobility comments pt refused assist from PT, v/c's for long sit technique and to complete quad set with L LE to assist with adduction off the bed.  increaed time  Transfers  Overall transfer level Needs assistance  Equipment used Rolling walker (2 wheeled)  Transfers Sit to/from Stand  Sit to Stand Min guard  General transfer comment v/c's for safey hand placement, pt does awkard twist requiring min guard for safety and v/c's for L LE alignment  Ambulation/Gait  Ambulation/Gait assistance Min assist  Ambulation Distance (Feet) 60 Feet  Assistive device Rolling walker (2 wheeled)  Gait Pattern/deviations Step-to pattern  General Gait Details pt required minA to advance L LE during swing phase for 40', then pt able to advance on own with minimal clearance. Pt fatigues quickly and reports increased L hip pain  Gait velocity slow  Gait velocity interpretation Below normal speed for  age/gender  Balance  Overall balance assessment Needs assistance  Standing balance support No upper extremity supported  Standing balance-Leahy Scale Fair  Standing balance comment pt able to stand and use cell phone with both hands without LOB  Exercises  Exercises General Lower Extremity  General Exercises - Lower Extremity  Quad Sets AROM;Left;10 reps;Supine  Long Arc Quad AROM;Left;10 reps;Seated  Hip Flexion/Marching AAROM;Left;10 reps;Standing  PT - End of Session  Equipment Utilized During Treatment Gait belt;Oxygen (2LO2 via [place])  Activity Tolerance Patient tolerated treatment well  Patient left in chair;with call bell/phone within reach;with family/visitor present  Nurse Communication Mobility status;Patient requests pain meds  PT - Assessment/Plan  PT Plan Current plan remains appropriate  PT Visit Diagnosis Pain;Difficulty in walking, not elsewhere classified (R26.2)  Pain - Right/Left Left  Pain - part of body Hip  PT Frequency (ACUTE ONLY) 7X/week  Follow Up Recommendations Home health PT;Supervision/Assistance - 24 hour  PT equipment Rolling walker with 5" wheels  AM-PAC PT "6 Clicks" Daily Activity Outcome Measure  Difficulty turning over in bed (including adjusting bedclothes, sheets and blankets)? 1  Difficulty moving from lying on back to sitting on the side of the bed?  1  Difficulty sitting down on and standing up from a chair with arms (e.g., wheelchair, bedside commode, etc,.)? 3  Help needed moving to and from a bed to chair (including a wheelchair)? 3  Help needed walking in hospital room? 3  Help needed climbing 3-5 steps with a railing?  2  6 Click Score 13  Mobility G Code  CK  PT Time Calculation  PT Start Time (ACUTE ONLY) 1256  PT Stop Time (ACUTE ONLY) 1323  PT Time Calculation (min) (ACUTE ONLY) 27 min  PT General Charges  $$ ACUTE PT VISIT 1 Visit  PT Treatments  $Gait Training 8-22 mins  $Therapeutic Exercise 8-22 mins   Kittie Plater,  PT, DPT Pager #: [phone] Office #: [phone]

## 2017-03-09 NOTE — Op Note (Signed)
NAMERONDALE, [name]:  [id number]  MEDICAL RECORD NO.:  [id number]  LOCATION:  [facility]                         FACILITY:  [facility]  PHYSICIAN:  Carl Lee, M.D.DATE OF BIRTH:  1956/02/23  DATE OF PROCEDURE:  03/08/2017 DATE OF DISCHARGE:                              OPERATIVE REPORT   PREOPERATIVE DIAGNOSIS:  Primary osteoarthritis and degenerative joint disease, left hip.  POSTOPERATIVE DIAGNOSIS:  Primary osteoarthritis and degenerative joint disease, left hip.  PROCEDURE:  Left total hip arthroplasty through direct anterior approach.  IMPLANTS:  DePuy Sector Gription acetabular component size 58, size 36+ 4 polyethylene liner, size 13 Corail femoral component with varus offset, size 36+ 5 ceramic hip ball.  SURGEON:  Carl Lee, M.D.  ASSISTANT:  Erskine Emery, PA-C.  ANESTHESIA:  Spinal.  ANTIBIOTICS:  2 g of IV Ancef.  BLOOD LOSS:  250-300 mL.  COMPLICATIONS:  None.  INDICATIONS:  Carl Lee is a very pleasant 61 year old gentleman with debilitating arthritis involving his left hip.  This has been well documented under clinical exam and radiography.  He has x-rays that showed complete loss of the joint space on his left hip.  He has tried and failed all forms of conservative treatment including multiple steroid injections.  At this point, he does wish to proceed with total hip arthroplasty given his daily pain, his decreased mobility, and his decreased quality of life.  He understands the risk of acute blood loss anemia, nerve and vessel injury, fracture, infection, dislocation, DVT. He understands our goals are to decrease pain, improve mobility, and overall improved quality of life.  PROCEDURE DESCRIPTION:  After informed consent was obtained, appropriate left hip was marked.  He was brought to the operating room where spinal anesthesia was obtained while he was on the stretcher.  He was laid in a supine position  on the stretcher.  Traction boots were placed on both of his feet.  Next, he was placed supine on the Hana fracture table with a perineal post in place and both legs in inline skeletal traction devices, but no traction applied.  His left operative hip was prepped and draped with DuraPrep and sterile drapes.  A time-out was called, and he was identified as correct patient and correct left hip.  We then made an incision just inferior and posterior to the anterior superior iliac spine and carried this obliquely down the leg.  We dissected down the tensor fascia lata muscle and tensor fascia was then divided longitudinally to proceed with a direct anterior approach to the hip. We identified and cauterized the circumflex vessels and identified the hip capsule.  I opened up the hip capsule finding a moderate joint effusion.  We placed Cobra retractors around the medial and lateral femoral neck within the joint capsule.  We then made our femoral neck cut with an oscillating saw proximal to the lesser trochanter and completed this with an osteotome.  We placed a corkscrew guide in the femoral head and removed the femoral head in its entirety, found it to be completely devoid of cartilage.  I then placed a bent Hohmann over the medial acetabular rim and released the  transverse acetabular ligament.  I then removed remnants of acetabular labrum.  I then began reaming under direct visualization from a size 43 reamer in several steps going up to a size 58 with all reamers under direct visualization and the last reamer under direct fluoroscopy, so we obtain our depth of reaming, our inclination and anteversion.  Once I was pleased with this, I placed the real DePuy Sector Gription acetabular component size 58 and a 36+ 4 polyethylene liner for that size acetabular component. Attention was then turned to the femur.  With the leg externally rotated to 120 degrees, extended and adducted, we were able to  place a Mueller retractor medially and a Hohmann retractor behind the greater trochanter.  We released the lateral joint capsule and used a box cutting osteotome to enter the femoral canal and a rongeur to lateralize.  I then began broaching using size 8 broach using the Corail broaching system going up to size 13.  With the 13 in place, we trialed a standard offset femoral neck and a 36+ 1 hip ball, reduced this in the acetabulum and we definitely need more offset based on our radiographs. We dislocated the hip and removed the trial components.  We were able to place the real Corail femoral component size 13, but this time with varus offset.  We were then able to place the real 36+ 5 ceramic hip ball.  We reduced this in the acetabulum and we were pleased with leg length, offset, stability, range of motion.  We then irrigated the soft tissue with normal saline solution using pulsatile lavage.  We were able to close the joint capsule with interrupted #1 Ethibond suture followed by running #1 Vicryl in the tensor fascia, 0 Vicryl in the deep tissue, 2-0 Vicryl in the subcutaneous tissue, 4-0 Monocryl subcuticular stitch and Steri-Strips on the skin.  An Aquacel dressing was applied.  He was taken off the Hana table and taken to the recovery room in stable condition.  All final counts were correct.  There were no complications noted.  Of note, Erskine Emery, PA-C assisted in the entire case.  His assistance was crucial for facilitating all aspects of this case.     Carl Lee, M.D.     CYB/MEDQ  D:  03/08/2017  T:  03/09/2017  Job:  [id number]

## 2017-03-09 NOTE — Progress Notes (Signed)
Subjective: 1 Day Post-Op Procedure(s) (LRB): LEFT TOTAL HIP ARTHROPLASTY ANTERIOR APPROACH (Left) Patient reports pain as moderate.    Objective: Vital signs in last 24 hours: Temp:  [97 F (36.1 C)-99.4 F (37.4 C)] 99.4 F (37.4 C) (08/28 2012) Pulse Rate:  [52-97] 81 (08/28 2012) Resp:  [14-30] 18 (08/28 2012) BP: (99-130)/(56-82) 123/68 (08/28 2012) SpO2:  [88 %-100 %] 93 % (08/28 2012) Weight:  [205 lb 9.6 oz (93.3 kg)] 205 lb 9.6 oz (93.3 kg) (08/28 1124)  Intake/Output from previous day: 08/28 0701 - 08/29 0700 In: 2150 [P.O.:150; I.V.:2000] Out: 2550 [Urine:2300; Blood:250] Intake/Output this shift: No intake/output data recorded.   Recent Labs  03/09/17 0549  HGB 12.9*    Recent Labs  03/09/17 0549  WBC 15.0*  RBC 4.16*  HCT 39.1  PLT 196   No results for input(s): NA, K, CL, CO2, BUN, CREATININE, GLUCOSE, CALCIUM in the last 72 hours. No results for input(s): LABPT, INR in the last 72 hours.  Sensation intact distally Intact pulses distally Dorsiflexion/Plantar flexion intact Incision: dressing C/D/I  Assessment/Plan: 1 Day Post-Op Procedure(s) (LRB): LEFT TOTAL HIP ARTHROPLASTY ANTERIOR APPROACH (Left) Up with therapy Discharge home with home health  Next 1-2 days.  Carl Lee 03/09/2017, 7:07 AM

## 2017-03-09 NOTE — Anesthesia Postprocedure Evaluation (Signed)
Anesthesia Post Note  Patient: Kathleene Hazel Dom  Procedure(s) Performed: Procedure(s) (LRB): LEFT TOTAL HIP ARTHROPLASTY ANTERIOR APPROACH (Left)     Anesthesia Type: Spinal    Last Vitals:  Vitals:   03/09/17 0827 03/09/17 0855  BP:    Pulse:    Resp:    Temp:    SpO2: 98% 96%    Last Pain:  Vitals:   03/09/17 0825  TempSrc: Oral  PainSc:                  Hope Holst

## 2017-03-09 NOTE — Progress Notes (Signed)
Transitions of Care - Pain Medication Review for Discharge to Home  Patient Carl Lee is a 61 YO M hospitalized on 03/08/17, s/p L total hip arthroplasy, anterior approach.   Assessment/Recommendation:  Patient with moderate to severe pain which is relieved ~80% by oxycodone 15 mg. Patient has received 94 mg of morphine equivalents since he arrived on the floor at 1934 yesterday, 8/28, but has not received any additional doses of oxycodone since 0600 this AM.   O2 Sats fell to 86% around 0800 on RA and have since improved. Currently on nasal canula.   Also receiving APAP 650 mg x 2 doses.  For discharge could consider:   1. Scheduled Acetaminophen 1000 mg po Q8H x 5-7 days, then PRN. Separating APAP and opioid avoids max dose of either from limiting pain management.   2. Avoid NSAIDs/COXIBs since hx of CABG x 3.   3. Oxycodone 5 mg tabs, 1-3 tabs q4h PRN moderate to severe pain. Qty: 80  Taper:  Days 1-4:  3 tabs x 4 doses  Days 5-7: 2 tabs x 4 doses  Days 8-10: 2 tabs x 3 doses  ____________________________________________________________ Prior to admission  PTA, patient is on oxycodone-acetaminophen 5/325 mg PO BID , which is 15 mg morphine equivalents daily.   [name] CSRS reviewed on 03/09/17 and shows that the patient should have approximately 4 days worth of their home opiate pain medications remaining. Last dispensed on 02/25/17, #30 for a 15DS.   Pertinent co-morbid conditions include bladder cancer, arthritis, CABG x 3, s/p AAA repair, OSA (wears CPAP) .   CrCl=105.3 ml/min (using Cockcroft-Gault equation).   Other pertinent PTA medications include aspirin 325 mg daily.     ------------------------------------------------------------------------------ During Admission  Today, patient reports 10/10 for surgical pain.  Scheduled Opioids:  -none  PRN Opioids:  -oxycodone 5-15 mg PO q3h PRN breakthrough pain (has received ~60 mg since 1919 yesterday)  -hydromorphone 1  mg IV q2h PRN severe pain, unresolved breakthrough pain. (received 1 dose)  Mg of Morphine Equivalents (past 24 hours): 94 mg   Non-opioid analgesics:  -acetaminophen 650 mg PO q6h PRN Mild pain (received 2 doses)   ____________________________________________________________ Assessment of Pain and Goals  Sitting for too long makes patients pain worse.   Repositioning makes patients pain better.   States that he is always in pain at home and "nothing makes it better" including oxycodone which he has taken in the past. Says he does not like the way oxycodone makes him feel and it gives him nightmares.  ____________________________________________________________  Counseling provided:  Max dose acetaminophen Opioid side effects Opioid disposal Pain expectations and goals

## 2017-03-09 NOTE — Progress Notes (Signed)
OT Cancellation Note  Patient Details Name: Carl Lee MRN: [id number] DOB: 1956-03-24   Cancelled Treatment:    Reason Eval/Treat Not Completed: Fatigue/lethargy limiting ability to participate. RN states pt getting ready to go on CPAP and needs to take a nap as he is worn out from earlier  PT sesssion  Britt Bottom 03/09/2017, 11:32 AM

## 2017-03-10 MED ORDER — OXYCODONE-ACETAMINOPHEN 5-325 MG PO TABS: 1.0000 | ORAL_TABLET | ORAL | 0 refills | Status: DC | PRN

## 2017-03-10 MED ORDER — METHOCARBAMOL 500 MG PO TABS: 500.0000 mg | ORAL_TABLET | Freq: Four times a day (QID) | ORAL | 0 refills | Status: DC | PRN

## 2017-03-10 NOTE — Progress Notes (Signed)
Called MD, reported Pt's difficulty flexing of L hip during PT.

## 2017-03-10 NOTE — Discharge Instructions (Signed)

## 2017-03-10 NOTE — Progress Notes (Addendum)
Physical Therapy Treatment Patient Details Name: Carl Lee MRN: [id number] DOB: 1956/06/10 Today's Date: 03/10/2017    History of Present Illness Pt admitted 8/28 for elective L direct THA. PMH: copd, s/p cabgx3, arthritis,s/p repair of AAA, OSA. - wears CPAP at night.    PT Comments    Pt performed increased gait but remains to require significant assistance to advance LLE forward.  Wife present and aware that she will need to assist her husband with ambulation.  Pt  Issued HEP and educated on frequency and technique during session this am.  Pt tolerated tx well but remains frustrated due to his apparent weakness. Will f/u in pm for stair training and continued gait training.     Follow Up Recommendations  Home health PT;Supervision/Assistance - 24 hour     Equipment Recommendations  Rolling walker with 5" wheels    Recommendations for Other Services       Precautions / Restrictions Precautions Precautions: None Precaution Comments: watch O2 Restrictions Weight Bearing Restrictions: Yes LLE Weight Bearing: Weight bearing as tolerated    Mobility  Bed Mobility Overal bed mobility: Needs Assistance Bed Mobility: Supine to Sit     Supine to sit: Mod assist     General bed mobility comments: Pt required cues for LE advancement with assist for LLE to edge of bed.  Pt then required assist with trunk elevation despite HOB being elevated.    Transfers Overall transfer level: Needs assistance Equipment used: Rolling walker (2 wheeled) Transfers: Sit to/from Stand Sit to Stand: Min assist         General transfer comment: VC remains for safe hand placement to and from seated surface.  Pt required cues for LE advancement forward to ease pain during transition from sitting/standing.  Pt remains to present with weakness in LLE.    Ambulation/Gait Ambulation/Gait assistance: Min assist Ambulation Distance (Feet): 80 Feet Assistive device: Rolling walker (2 wheeled) Gait  Pattern/deviations: Step-to pattern;Decreased step length - left;Decreased dorsiflexion - left;Shuffle;Antalgic;Trunk flexed Gait velocity: slow Gait velocity interpretation: Below normal speed for age/gender General Gait Details: Pt remains to require min assist to advance LLE during swing phase.  Regressed patient to maintain step to pattern to avoid dragging LLE along.  Pt attempted step forward with LLE and presents with LLE in an "inch worm like technique".  Pt required cues for L hip/knee flexion and L dorsiflexion in swing phase.  Pt will require assistance at home due to weakness.     Stairs            Wheelchair Mobility    Modified Rankin (Stroke Patients Only)       Balance Overall balance assessment: Needs assistance Sitting-balance support: Feet supported;No upper extremity supported Sitting balance-Leahy Scale: Fair       Standing balance-Leahy Scale: Poor                              Cognition Arousal/Alertness: Awake/alert Behavior During Therapy: WFL for tasks assessed/performed Overall Cognitive Status: Within Functional Limits for tasks assessed                                        Exercises Total Joint Exercises Ankle Circles/Pumps: AROM;Both;Supine;20 reps Quad Sets: AROM;Left;10 reps;Supine Short Arc Quad: AROM;Left;10 reps;Supine Heel Slides: AAROM;Left;10 reps;Supine Hip ABduction/ADduction: AAROM;Left;10 reps;Supine Long Arc Quad: AROM;Left;10 reps;Seated  General Comments        Pertinent Vitals/Pain Pain Assessment: 0-10 Pain Score: 8  Pain Location: L hip Pain Descriptors / Indicators: Sharp Pain Intervention(s): Monitored during session;Repositioned    Home Living                      Prior Function            PT Goals (current goals can now be found in the care plan section) Acute Rehab PT Goals Patient Stated Goal: stop the pain Potential to Achieve Goals: Good Progress towards  PT goals: Progressing toward goals    Frequency    7X/week      PT Plan Current plan remains appropriate    Co-evaluation              AM-PAC PT "6 Clicks" Daily Activity  Outcome Measure  Difficulty turning over in bed (including adjusting bedclothes, sheets and blankets)?: Unable Difficulty moving from lying on back to sitting on the side of the bed? : Unable Difficulty sitting down on and standing up from a chair with arms (e.g., wheelchair, bedside commode, etc,.)?: Unable Help needed moving to and from a bed to chair (including a wheelchair)?: A Little Help needed walking in hospital room?: A Little Help needed climbing 3-5 steps with a railing? : A Lot 6 Click Score: 11    End of Session Equipment Utilized During Treatment: Gait belt Activity Tolerance: Patient tolerated treatment well Patient left: in chair;with call bell/phone within reach;with family/visitor present Nurse Communication: Mobility status;Patient requests pain meds PT Visit Diagnosis: Pain;Difficulty in walking, not elsewhere classified (R26.2) Pain - Right/Left: Left Pain - part of body: Hip     Time: 0722-5750 PT Time Calculation (min) (ACUTE ONLY): 31 min  Charges:  $Gait Training: 8-22 mins $Therapeutic Exercise: 8-22 mins                    G Codes:       Governor Rooks, PTA pager [place] 03/10/2017, 12:14 PM

## 2017-03-10 NOTE — Evaluation (Signed)
Occupational Therapy Evaluation Patient Details Name: Carl Lee MRN: [id number] DOB: 01/28/1956 Today's Date: 03/10/2017    History of Present Illness Pt admitted 8/28 for elective L direct THA. PMH: copd, s/p cabgx3, arthritis,s/p repair of AAA, OSA. - wears CPAP at night.   Clinical Impression   Pt reports he occasionally required min assist with LB dressing PTA but was mostly independent with ADL. Currently pt requires min guard assist for ADL and functional mobility with the exception of min assist for LB ADL. Pt planning to d/c home with 24/7 supervision from his wife. Pt would benefit from continued skilled OT to address established goals.    Follow Up Recommendations  Supervision/Assistance - 24 hour;DC plan and follow up therapy as arranged by surgeon    Equipment Recommendations  3 in 1 bedside commode    Recommendations for Other Services       Precautions / Restrictions Precautions Precautions: None Precaution Comments: watch O2 Restrictions Weight Bearing Restrictions: Yes LLE Weight Bearing: Weight bearing as tolerated      Mobility Bed Mobility Overal bed mobility: Needs Assistance Bed Mobility: Supine to Sit     Supine to sit: Min assist     General bed mobility comments: Pt able to manage LEs to EOB. Required min HHA to boost trunk up to sitting position.  Transfers Overall transfer level: Needs assistance Equipment used: Rolling walker (2 wheeled) Transfers: Sit to/from Stand Sit to Stand: Min guard         General transfer comment: for safety. Good hand placement and technique    Balance Overall balance assessment: Needs assistance Sitting-balance support: Feet supported;No upper extremity supported Sitting balance-Leahy Scale: Fair     Standing balance support: No upper extremity supported;During functional activity Standing balance-Leahy Scale: Fair Standing balance comment: Able to stand at sink and wash hands without UE support                           ADL either performed or assessed with clinical judgement   ADL Overall ADL's : Needs assistance/impaired Eating/Feeding: Set up;Sitting   Grooming: Min guard;Standing;Wash/dry hands   Upper Body Bathing: Set up;Supervision/ safety;Sitting   Lower Body Bathing: Minimal assistance;Sit to/from stand   Upper Body Dressing : Set up;Supervision/safety;Sitting   Lower Body Dressing: Minimal assistance;Sit to/from stand Lower Body Dressing Details (indicate cue type and reason): to adjust L sock Toilet Transfer: Min guard;Ambulation;RW Toilet Transfer Details (indicate cue type and reason): pt able to stand at toilet to void         Functional mobility during ADLs: Min guard;Rolling walker       Vision         Perception     Praxis      Pertinent Vitals/Pain Pain Assessment: 0-10 Pain Score: 5  Pain Location: L hip Pain Descriptors / Indicators: Aching;Sore Pain Intervention(s): Monitored during session;Repositioned;Ice applied     Hand Dominance Right   Extremity/Trunk Assessment Upper Extremity Assessment Upper Extremity Assessment: Overall WFL for tasks assessed   Lower Extremity Assessment Lower Extremity Assessment: Defer to PT evaluation   Cervical / Trunk Assessment Cervical / Trunk Assessment: Normal   Communication Communication Communication: No difficulties   Cognition Arousal/Alertness: Awake/alert Behavior During Therapy: WFL for tasks assessed/performed Overall Cognitive Status: Within Functional Limits for tasks assessed  General Comments       Exercises    Shoulder Instructions      Home Living Family/patient expects to be discharged to:: Private residence Living Arrangements: Spouse/significant other Available Help at Discharge: Family;Available 24 hours/day Type of Home: House Home Access: Stairs to enter [employer] of Steps: 2 Entrance  Stairs-Rails: None Home Layout: Two level;Able to live on main level with bedroom/bathroom     Bathroom Shower/Tub: Tub/shower unit;Curtain   Bathroom Toilet: Handicapped height     Home Equipment: [place] - single point          Prior Functioning/Environment Level of Independence: Independent        Comments: wife assisted with LB ADL at times        OT Problem List: Decreased strength;Decreased range of motion;Decreased activity tolerance;Impaired balance (sitting and/or standing);Decreased knowledge of use of DME or AE;Decreased knowledge of precautions;Pain      OT Treatment/Interventions: Self-care/ADL training;Energy conservation;DME and/or AE instruction;Therapeutic activities;Patient/family education;Balance training    OT Goals(Current goals can be found in the care plan section) Acute Rehab OT Goals Patient Stated Goal: be able to walk better OT Goal Formulation: With patient Time For Goal Achievement: 03/24/17 Potential to Achieve Goals: Good ADL Goals Pt Will Perform Lower Body Bathing: with supervision;sit to/from stand Pt Will Perform Lower Body Dressing: with supervision;sit to/from stand Pt Will Transfer to Toilet: with supervision;ambulating;bedside commode Pt Will Perform Toileting - Clothing Manipulation and hygiene: with supervision;sit to/from stand Pt Will Perform Tub/Shower Transfer: Tub transfer;with supervision;ambulating;3 in 1;rolling walker  OT Frequency: Min 2X/week   Barriers to D/C:            Co-evaluation              AM-PAC PT "6 Clicks" Daily Activity     Outcome Measure Help from another person eating meals?: None Help from another person taking care of personal grooming?: A Little Help from another person toileting, which includes using toliet, bedpan, or urinal?: A Little Help from another person bathing (including washing, rinsing, drying)?: A Little Help from another person to put on and taking off regular upper body  clothing?: A Little Help from another person to put on and taking off regular lower body clothing?: A Little 6 Click Score: 19   End of Session Equipment Utilized During Treatment: Gait belt;Rolling walker  Activity Tolerance: Patient tolerated treatment well Patient left: in chair;with call bell/phone within reach  OT Visit Diagnosis: Unsteadiness on feet (R26.81);Other abnormalities of gait and mobility (R26.89);Pain Pain - Right/Left: Left Pain - part of body: Hip                Time: 2993-7169 OT Time Calculation (min): 24 min Charges:  OT General Charges $OT Visit: 1 Visit OT Evaluation $OT Eval Moderate Complexity: 1 Mod OT Treatments $Self Care/Home Management : 8-22 mins G-Codes:     Caramia Boutin A. Ulice Brilliant, M.S., OTR/L Pager: [place] 03/10/2017, 2:40 PM

## 2017-03-10 NOTE — Progress Notes (Signed)
Subjective: 2 Days Post-Op Procedure(s) (LRB): LEFT TOTAL HIP ARTHROPLASTY ANTERIOR APPROACH (Left) Patient reports pain as moderate.  Patient reports he does feel a little better today and he does look better.  He is concerned about brusing around his hip.  Objective: Vital signs in last 24 hours: Temp:  [98.3 F (36.8 C)-98.8 F (37.1 C)] 98.3 F (36.8 C) (08/30 0937) Pulse Rate:  [77-94] 84 (08/30 0937) Resp:  [16-18] 16 (08/30 0937) BP: (106-134)/(60-74) 106/60 (08/30 0937) SpO2:  [91 %-94 %] 91 % (08/30 0937)  Intake/Output from previous day: 08/29 0701 - 08/30 0700 In: 1452.5 [P.O.:480; I.V.:972.5] Out: 1500 [Urine:1500] Intake/Output this shift: Total I/O In: -  Out: 550 [Urine:550]   Recent Labs  03/09/17 0549  HGB 12.9*    Recent Labs  03/09/17 0549  WBC 15.0*  RBC 4.16*  HCT 39.1  PLT 196    Recent Labs  03/09/17 0549  NA 136  K 3.8  CL 101  CO2 26  BUN 14  CREATININE 0.82  GLUCOSE 126*  CALCIUM 8.5*   No results for input(s): LABPT, INR in the last 72 hours.  Sensation intact distally Intact pulses distally Dorsiflexion/Plantar flexion intact Incision: dressing C/D/I No cellulitis present Compartment soft  Bruising like this can be normal and I am not concerned.  Assessment/Plan: 2 Days Post-Op Procedure(s) (LRB): LEFT TOTAL HIP ARTHROPLASTY ANTERIOR APPROACH (Left) Up with therapy Plan for discharge tomorrow Discharge home with home health  [name] 03/10/2017, 10:19 AM

## 2017-03-10 NOTE — Care Management Note (Signed)
Case Management Note  Patient Details  Name: TAAJ HURLBUT MRN: [id number] Date of Birth: 08/06/1955  Subjective/Objective:   61 yr old gentleman s/p left total hip arthroplasty.                 Action/Plan: Case manager spoke with patient and his wife concerning discharge plan and DME. Patient was preoperatively setup with [facility], no changes. CM has ordered DME. Patient will have family support at discharge.    Expected Discharge Date:   03/10/17               Expected Discharge Plan:  [place]  In-House Referral:  NA  Discharge planning Services  CM Consult  Post Acute Care Choice:  Durable Medical Equipment, Home Health Choice offered to:  Patient  DME Arranged:  3-N-1, Walker rolling DME Agency:  [place]:  PT [place]:  [facility] (formerly [facility])  Status of Service:  Completed, signed off  If discussed at [employer], dates discussed:    Additional Comments:  Ninfa Meeker, RN 03/10/2017, 11:56 AM

## 2017-03-10 NOTE — Progress Notes (Signed)
Patient ID: Carl Lee, male   DOB: Jun 09, 1956, 61 y.o.   MRN: [id number] I just examined the patient at the bedside and his left hip is stable on my exam.  His leg lengths are equal and I can rotate the hip.  No other recommendations.

## 2017-03-10 NOTE — Progress Notes (Signed)
Physical Therapy Treatment Patient Details Name: Carl Lee MRN: [id number] DOB: [birth year] Today's Date: 03/10/2017    History of Present Illness Pt admitted 8/28 for elective L direct THA. PMH: copd, s/p cabgx3, arthritis,s/p repair of AAA, OSA. - wears CPAP at night.    PT Comments    Pt remains severely limited due to weakness in L hip.  Pt presents with increased edema in L hip which limits his ability to sequence correctly during gait without significant assistance.  Pt educated to switch sequencing to improve his function and increase his independence.  Pt appears guarded as well when attempting flexion due to pain in L hip.  Informed nursing who reports she will inform MD of findings.     Follow Up Recommendations  Home health PT;Supervision/Assistance - 24 hour     Equipment Recommendations  Rolling walker with 5" wheels    Recommendations for Other Services       Precautions / Restrictions Precautions Precautions: None Precaution Comments: watch O2 Restrictions Weight Bearing Restrictions: Yes LLE Weight Bearing: Weight bearing as tolerated    Mobility  Bed Mobility Overal bed mobility: Needs Assistance Bed Mobility: Sit to Supine         General bed mobility comments: Pt required min assist with LEs despite teaching to self assist patient remains limited due to pain.    Transfers Overall transfer level: Needs assistance Equipment used: Rolling walker (2 wheeled) Transfers: Sit to/from Stand Sit to Stand: Min guard         General transfer comment: for safety. Good hand placement and technique  Ambulation/Gait Ambulation/Gait assistance: Min assist Ambulation Distance (Feet): 120 Feet Assistive device: Rolling walker (2 wheeled) Gait Pattern/deviations: Step-to pattern;Decreased step length - left;Decreased dorsiflexion - left;Shuffle;Antalgic;Trunk flexed Gait velocity: slow Gait velocity interpretation: Below normal speed for age/gender General  Gait Details: Pt remains UNABLE to actively flex L hip forward during swing phase.  This weakness limits his gait but he is able to alter his sequencing to advance gait without min assist by leading with non-operative limb into RW in a step to pattern.    Stairs            Wheelchair Mobility    Modified Rankin (Stroke Patients Only)       Balance Overall balance assessment: Needs assistance Sitting-balance support: Feet supported;No upper extremity supported Sitting balance-Leahy Scale: Fair     Standing balance support: No upper extremity supported;During functional activity Standing balance-Leahy Scale: Fair Standing balance comment: Able to stand at sink and wash hands without UE support                            Cognition Arousal/Alertness: Awake/alert Behavior During Therapy: WFL for tasks assessed/performed Overall Cognitive Status: Within Functional Limits for tasks assessed                                        Exercises   General Comments        Pertinent Vitals/Pain Pain Assessment: 0-10 Pain Score: 8  Pain Location: L hip Pain Descriptors / Indicators: Aching;Sore Pain Intervention(s): Monitored during session;Repositioned;Ice applied    Home Living Family/patient expects to be discharged to:: Private residence Living Arrangements: Spouse/significant other Available Help at Discharge: Family;Available 24 hours/day Type of Home: House Home Access: Stairs to enter Entrance Stairs-Rails: None Home Layout: Two  level;Able to live on main level with bedroom/bathroom Home Equipment: Kasandra Knudsen - single point      Prior Function Level of Independence: Independent      Comments: wife assisted with LB ADL at times   PT Goals (current goals can now be found in the care plan section) Acute Rehab PT Goals Patient Stated Goal: be able to walk better Potential to Achieve Goals: Good Progress towards PT goals: Progressing toward  goals    Frequency    7X/week      PT Plan Current plan remains appropriate    Co-evaluation              AM-PAC PT "6 Clicks" Daily Activity  Outcome Measure  Difficulty turning over in bed (including adjusting bedclothes, sheets and blankets)?: Unable Difficulty moving from lying on back to sitting on the side of the bed? : Unable Difficulty sitting down on and standing up from a chair with arms (e.g., wheelchair, bedside commode, etc,.)?: Unable Help needed moving to and from a bed to chair (including a wheelchair)?: A Little Help needed walking in hospital room?: A Little Help needed climbing 3-5 steps with a railing? : A Lot 6 Click Score: 11    End of Session Equipment Utilized During Treatment: Gait belt Activity Tolerance: Patient tolerated treatment well Patient left: with call bell/phone within reach;with family/visitor present;in bed Nurse Communication: Mobility status;Patient requests pain meds PT Visit Diagnosis: Pain;Difficulty in walking, not elsewhere classified (R26.2) Pain - Right/Left: Left Pain - part of body: Hip     Time: 6415-8309 PT Time Calculation (min) (ACUTE ONLY): 22 min  Charges:  $Gait Training: 8-22 mins                    G Codes:       Governor Rooks, PTA pager [place] 03/10/2017, 4:05 PM

## 2017-03-11 NOTE — Care Management Important Message (Signed)
Important Message  Patient Details  Name: Carl Lee MRN: [id number] Date of Birth: Mar 22, 1956   Medicare Important Message Given:  Yes    Nathen May 03/11/2017, 9:34 AM

## 2017-03-11 NOTE — Progress Notes (Signed)
Pt discharged via wheelchair. Pt is not in distress. Pt belongings with pt. 

## 2017-03-11 NOTE — Progress Notes (Signed)
Physical Therapy Treatment Patient Details Name: Carl Lee MRN: [id number] DOB: 1955/08/13 Today's Date: 03/11/2017    History of Present Illness Pt admitted 8/28 for elective L direct THA. PMH: copd, s/p cabgx3, arthritis,s/p repair of AAA, OSA. - wears CPAP at night.    PT Comments    Pt progressing with gait pattern today.  Reviewed standing exercises and stair training for carryover at home.  Pt remains limited with active hip flexion during session.  Plan to return home today.     Follow Up Recommendations  Home health PT;Supervision/Assistance - 24 hour     Equipment Recommendations  Rolling walker with 5" wheels    Recommendations for Other Services       Precautions / Restrictions Precautions Precautions: None Precaution Comments: watch O2 Restrictions Weight Bearing Restrictions: Yes LLE Weight Bearing: Weight bearing as tolerated    Mobility  Bed Mobility Overal bed mobility: Needs Assistance Bed Mobility: Sit to Supine     Supine to sit: Supervision Sit to supine: Supervision   General bed mobility comments: HOB flaT with rails removed and elevated height to simulate home environment.  Pt required increased time and cues for lifting LLEs into bed.  Pt went to sidelying to pull LEs into bed and then able to roll into supine.    Transfers Overall transfer level: Needs assistance Equipment used: Rolling walker (2 wheeled) Transfers: Sit to/from [facility] Sit to Stand: Min guard Stand pivot transfers: Min guard       General transfer comment: Cues for hand placement to and from seated surface.   Ambulation/Gait Ambulation/Gait assistance: Supervision Ambulation Distance (Feet): 150 Feet Assistive device: Rolling walker (2 wheeled) Gait Pattern/deviations: Step-through pattern;Decreased step length - left;Trunk flexed;Antalgic Gait velocity: slow Gait velocity interpretation: Below normal speed for age/gender General Gait Details:  Pt able to progress to swing through/step through gait.  Pt able to create momentum to swing LLE past RLE to simulate a more normal pattern.     Stairs Stairs: Yes   Stair Management: No rails;Backwards;Step to pattern;With walker Number of Stairs: 2 General stair comments: Cues for sequencing and RW placement. Min assist to keep RW steady during stair negotiation.    Wheelchair Mobility    Modified Rankin (Stroke Patients Only)       Balance Overall balance assessment: Needs assistance   Sitting balance-Leahy Scale: Fair       Standing balance-Leahy Scale: Fair                              Cognition Arousal/Alertness: Awake/alert Behavior During Therapy: WFL for tasks assessed/performed Overall Cognitive Status: Within Functional Limits for tasks assessed                                        Exercises Total Joint Exercises Short Arc Quad: AROM;Left;5 reps;Supine Hip ABduction/ADduction: AROM;Left;10 reps;Standing Knee Flexion: AROM;Left;10 reps;Standing Marching in Standing: AAROM;Left;10 reps;Standing Standing Hip Extension: AROM;Left;10 reps;Standing    General Comments        Pertinent Vitals/Pain Pain Assessment: 0-10 Pain Score: 4  Pain Location: L hip Pain Descriptors / Indicators: Aching Pain Intervention(s): Monitored during session;Repositioned    Home Living                      Prior Function  PT Goals (current goals can now be found in the care plan section) Acute Rehab PT Goals Potential to Achieve Goals: Good Progress towards PT goals: Progressing toward goals    Frequency    7X/week      PT Plan Current plan remains appropriate    Co-evaluation              AM-PAC PT "6 Clicks" Daily Activity  Outcome Measure  Difficulty turning over in bed (including adjusting bedclothes, sheets and blankets)?: A Little Difficulty moving from lying on back to sitting on the side of  the bed? : A Little Difficulty sitting down on and standing up from a chair with arms (e.g., wheelchair, bedside commode, etc,.)?: A Little Help needed moving to and from a bed to chair (including a wheelchair)?: A Little Help needed walking in hospital room?: A Little Help needed climbing 3-5 steps with a railing? : A Little 6 Click Score: 18    End of Session Equipment Utilized During Treatment: Gait belt Activity Tolerance: Patient tolerated treatment well Patient left: with call bell/phone within reach;with family/visitor present;in bed Nurse Communication: Mobility status;Patient requests pain meds PT Visit Diagnosis: Pain;Difficulty in walking, not elsewhere classified (R26.2) Pain - Right/Left: Left Pain - part of body: Hip     Time: 7622-6333 PT Time Calculation (min) (ACUTE ONLY): 29 min  Charges:  $Gait Training: 8-22 mins $Therapeutic Exercise: 8-22 mins                    G Codes:       Governor Rooks, PTA pager [place] 03/11/2017, 10:03 AM

## 2017-03-11 NOTE — Progress Notes (Signed)
Pt discharge instructions reviewed with pt. Pt verbalizes understanding. Pt belongings with pt. Pt is not in distress. Pt's wife is driving him home. Pt is waiting on equipment to be delivered to his room and then will be discharged.

## 2017-03-11 NOTE — Progress Notes (Signed)
Occupational Therapy Treatment Patient Details Name: Carl Lee MRN: [id number] DOB: 05-03-1956 Today's Date: 03/11/2017    History of present illness Pt admitted 8/28 for elective L direct THA. PMH: copd, s/p cabgx3, arthritis,s/p repair of AAA, OSA. - wears CPAP at night.   OT comments  Pt. Able to complete bed mobility, toilet transfer and tub transfer with min guard a/ S level of assist.  Remains limited with hip flexion making functional mobility difficult.  Will continue to follow acutely but note d/c likely later today.    Follow Up Recommendations  Supervision/Assistance - 24 hour;DC plan and follow up therapy as arranged by surgeon    Equipment Recommendations  3 in 1 bedside commode    Recommendations for Other Services      Precautions / Restrictions Precautions Precautions: None Precaution Comments: watch O2 Restrictions Weight Bearing Restrictions: Yes LLE Weight Bearing: Weight bearing as tolerated       Mobility Bed Mobility Overal bed mobility: Needs Assistance Bed Mobility: Supine to Sit     Supine to sit: Supervision     General bed mobility comments: hob flat, exits on the R side of high bed.  reviwed with PTA to practice back into bed   Transfers Overall transfer level: Needs assistance Equipment used: Rolling walker (2 wheeled) Transfers: Sit to/from [facility] Sit to Stand: Min guard Stand pivot transfers: Min guard       General transfer comment: cues to complete full pivot before sitting down    Balance                                           ADL either performed or assessed with clinical judgement   ADL Overall ADL's : Needs assistance/impaired                         Toilet Transfer: Midwife Details (indicate cue type and reason): pt able to stand at toilet to void Toileting- Water quality scientist and Hygiene:  Supervision/safety;Sit to/from stand   Tub/ Shower Transfer: Tub transfer;Min guard;Cueing for safety;Cueing for sequencing;Ambulation;Grab bars;Rolling walker Tub/Shower Transfer Details (indicate cue type and reason): educated on need for his sig. other to assist with stabalizing the RW during side step in/out of tub.  also reviewed use of 3n1 for shower seat as needed. pt. with limited to no hip flexion but able to utilize standing knee flexion to step in/out of tub         Vision       Perception     Praxis      Cognition Arousal/Alertness: Awake/alert Behavior During Therapy: WFL for tasks assessed/performed Overall Cognitive Status: Within Functional Limits for tasks assessed                                          Exercises     Shoulder Instructions       General Comments      Pertinent Vitals/ Pain       Pain Assessment: 0-10 Pain Score: 4  Pain Location: L hip Pain Descriptors / Indicators: Aching Pain Intervention(s): Limited activity within patient's tolerance;Monitored during session;Premedicated before session  Home Living  Prior Functioning/Environment              Frequency  Min 2X/week        Progress Toward Goals  OT Goals(current goals can now be found in the care plan section)  Progress towards OT goals: Progressing toward goals     Plan Discharge plan remains appropriate    Co-evaluation                 AM-PAC PT "6 Clicks" Daily Activity     Outcome Measure   Help from another person eating meals?: None Help from another person taking care of personal grooming?: A Little Help from another person toileting, which includes using toliet, bedpan, or urinal?: A Little Help from another person bathing (including washing, rinsing, drying)?: A Little Help from another person to put on and taking off regular upper body clothing?: A Little Help from  another person to put on and taking off regular lower body clothing?: A Little 6 Click Score: 19    End of Session Equipment Utilized During Treatment: Gait belt;Rolling walker  OT Visit Diagnosis: Unsteadiness on feet (R26.81);Other abnormalities of gait and mobility (R26.89);Pain Pain - Right/Left: Left Pain - part of body: Hip   Activity Tolerance Patient tolerated treatment well   Patient Left in chair;with call bell/phone within reach   Nurse Communication          Time: 6283-6629 OT Time Calculation (min): 18 min  Charges: OT General Charges $OT Visit: 1 Visit OT Treatments $Self Care/Home Management : 8-22 mins   Janice Coffin, COTA/L 03/11/2017, 9:32 AM

## 2017-03-11 NOTE — Progress Notes (Signed)
Patient ID: Carl Lee, male   DOB: 11-06-1955, 60 y.o.   MRN: [id number] Doing well overall.  Left hip stable on exam.  Vitals stable.  Can be discharged to home today.

## 2017-03-11 NOTE — Progress Notes (Signed)
Requested [facility] to deliver 3/1 and RW to room prior to Dc today.

## 2017-03-11 NOTE — Discharge Summary (Signed)
Patient ID: Carl Lee MRN: [id number] DOB/AGE: 12-17-1955 61 y.o.  Admit date: 03/08/2017 Discharge date: 03/11/2017  Admission Diagnoses:  Principal Problem:   Osteoarthritis of left hip Active Problems:   Status post total replacement of left hip   Discharge Diagnoses:  Same  Past Medical History:  Diagnosis Date  . AAA (abdominal aortic aneurysm) ([place]) 03/25/12   s/p endovascular repair  . Adenomatous polyp 06/2009  . Atrial tachycardia, paroxysmal ([place]) 06/18/2016  . Bladder cancer ([place]) 08/16/2008   recurrence 2015 and 2018 treated with office fulguration  . CAD (coronary artery disease), native coronary artery    S/p 3v CABG 01/2014   . Childhood asthma    as a child  . Complication of anesthesia    "takes me a year to get over"  . Contact dermatitis    atypical [name])  . COPD (chronic obstructive pulmonary disease) ([place])   . Coronary artery disease    a.2015: CABG w/ LIMA-LAD, RIMA-OM, SVG-RCA b. Cath on 07/10/15 w/ patent LIMA-LAD and RIMA-OM. SVG-RCA occluded but collaterals present  . Diverticulosis 2013   severe by CT and colonoscopy  . Environmental allergies    improved as ages  . Fracture of lateral malleolus of left ankle 08/29/2013  . Headache    hasn't had migraines in years  . Hepatic steatosis 06/2015   by [place]  . Hx of migraines   . Hypertension   . Lumbosacral radiculopathy at S1 03/2012   left, with spinal stenosis (MRI 04/2013) improved with TF ESI L5/S1 and S1/2 ([facility] [place])  . OSA (obstructive sleep apnea) 09/04/2014   Severe with AHI 35/hr, uses BIPAP  . Overweight (BMI 25.0-29.9) 01/26/2017  . Pre-diabetes   . Prediabetes 06/08/2014   CAD   . Resistance to clopidogrel 2015   drug metabolism panel run - asked to scan  . Spinal stenosis    LS1 nerve root impingement from bulging disc  . Vitamin B12 deficiency   . Zenker diverticulum     Surgeries: Procedure(s): LEFT TOTAL HIP ARTHROPLASTY ANTERIOR APPROACH on 03/08/2017    Consultants:   Discharged Condition: Improved  Hospital Course: DEMON VOLANTE is an 61 y.o. male who was admitted 03/08/2017 for operative treatment ofOsteoarthritis of left hip. Patient has severe unremitting pain that affects sleep, daily activities, and work/hobbies. After pre-op clearance the patient was taken to the operating room on 03/08/2017 and underwent  Procedure(s): LEFT TOTAL HIP ARTHROPLASTY ANTERIOR APPROACH.    Patient was given perioperative antibiotics: Anti-infectives    Start     Dose/Rate Route Frequency Ordered Stop   03/08/17 2030  ceFAZolin (ANCEF) IVPB 1 g/50 mL premix     1 g 100 mL/hr over 30 Minutes Intravenous Every 6 hours 03/08/17 1952 03/09/17 0225   03/08/17 1300  ceFAZolin (ANCEF) IVPB 2g/100 mL premix     2 g 200 mL/hr over 30 Minutes Intravenous To [facility]Stay [facility] 03/07/17 1239 03/08/17 1405       Patient was given sequential compression devices, early ambulation, and chemoprophylaxis to prevent DVT.  Patient benefited maximally from hospital stay and there were no complications.    Recent vital signs: Patient Vitals for the past 24 hrs:  BP Temp Temp src Pulse Resp SpO2  03/11/17 0417 (!) 107/58 99.2 F (37.3 C) Oral 82 - 93 %  03/10/17 2200 123/71 - - 91 - -  03/10/17 2017 (!) 107/56 (!) 100.8 F (38.2 C) Oral 86 - 93 %  03/10/17 1720 (!) 117/56 99.8  F (37.7 C) Oral 83 - 92 %  03/10/17 1438 111/68 99.2 F (37.3 C) Oral 86 - 92 %  03/10/17 0937 106/60 98.3 F (36.8 C) Oral 84 16 91 %  03/10/17 0914 - - - 94 18 94 %     Recent laboratory studies:  Recent Labs  03/09/17 0549  WBC 15.0*  HGB 12.9*  HCT 39.1  PLT 196  NA 136  K 3.8  CL 101  CO2 26  BUN 14  CREATININE 0.82  GLUCOSE 126*  CALCIUM 8.5*     Discharge Medications:   Allergies as of 03/11/2017      Reactions   Codeine Other (See Comments)   Nightmares   Doxycycline Hives   mild      Medication List    TAKE these medications   albuterol 108 (90 Base)  MCG/ACT inhaler Commonly known as:  PROAIR HFA Inhale 2 puffs into the lungs every 6 (six) hours as needed for wheezing or shortness of breath.   amLODipine 5 MG tablet Commonly known as:  NORVASC Take 1 tablet (5 mg total) by mouth daily.   aspirin 325 MG EC tablet Take 1 tablet (325 mg total) by mouth daily.   atorvastatin 20 MG tablet Commonly known as:  LIPITOR TAKE 1 TABLET (20 MG TOTAL) BY MOUTH DAILY AT 6 PM.   fluticasone furoate-vilanterol 100-25 MCG/INH Aepb Commonly known as:  BREO ELLIPTA Inhale 1 puff into the lungs daily.   methocarbamol 500 MG tablet Commonly known as:  ROBAXIN Take 1 tablet (500 mg total) by mouth every 6 (six) hours as needed for muscle spasms.   metoprolol tartrate 25 MG tablet Commonly known as:  LOPRESSOR Take 12.5 mg by mouth 2 (two) times daily.   oxyCODONE-acetaminophen 5-325 MG tablet Commonly known as:  PERCOCET/ROXICET Take 1-2 tablets by mouth every 4 (four) hours as needed for severe pain. What changed:  how much to take  when to take this  reasons to take this   PEPCID 20 MG tablet Generic drug:  famotidine Take 20 mg by mouth 2 (two) times daily.   vitamin B-12 500 MCG tablet Commonly known as:  CYANOCOBALAMIN Take 1 tablet (500 mcg total) by mouth daily. What changed:  when to take this            Durable Medical Equipment        Start     Ordered   03/08/17 1953  DME 3 n 1  Once     03/08/17 1952   03/08/17 1953  DME Walker rolling  Once    Question:  Patient needs a walker to treat with the following condition  Answer:  Status post total replacement of left hip   03/08/17 1952       Discharge Care Instructions        Start     Ordered   03/11/17 0000  Discharge patient    Question Answer Comment  Discharge disposition 01-Home or Self Care   Discharge patient date 03/11/2017      03/11/17 0655   03/10/17 0000  methocarbamol (ROBAXIN) 500 MG tablet  Every 6 hours PRN     03/10/17 1023    03/10/17 0000  oxyCODONE-acetaminophen (PERCOCET/ROXICET) 5-325 MG tablet  Every 4 hours PRN     03/10/17 1023      Diagnostic Studies: Dg Pelvis Portable  Result Date: 03/08/2017 CLINICAL DATA:  Post total hip replacement EXAM: PORTABLE PELVIS 1-2 VIEWS COMPARISON:  Portable  exam 1545 hours compared to intraoperative images of 03/08/2017 FINDINGS: LEFT hip prosthesis identified. No fracture, dislocation or acute complication seen. Bones appear demineralized. IMPRESSION: LEFT hip prosthesis without acute complication. Electronically Signed   By: Lavonia Dana M.D.   On: 03/08/2017 16:05   Dg C-arm 1-60 Min  Result Date: 03/08/2017 CLINICAL DATA:  Left hip replacement EXAM: DG C-ARM 61-120 MIN; OPERATIVE LEFT HIP WITH PELVIS COMPARISON:  None. FINDINGS: Changes of left hip replacement. No hardware or bony complicating feature. Normal AP alignment. IMPRESSION: Left hip replacement.  No complicating feature. Electronically Signed   By: Rolm Baptise M.D.   On: 03/08/2017 15:26   Dg Hip Operative Unilat W Or W/o Pelvis Left  Result Date: 03/08/2017 CLINICAL DATA:  Left hip replacement EXAM: DG C-ARM 61-120 MIN; OPERATIVE LEFT HIP WITH PELVIS COMPARISON:  None. FINDINGS: Changes of left hip replacement. No hardware or bony complicating feature. Normal AP alignment. IMPRESSION: Left hip replacement.  No complicating feature. Electronically Signed   By: Rolm Baptise M.D.   On: 03/08/2017 15:26    Disposition: 01-Home or Self Care  Discharge Instructions    Discharge patient    Complete by:  As directed    Discharge disposition:  01-Home or Self Care   Discharge patient date:  03/11/2017      Follow-up Information    [name], MD Follow up in 2 week(s).   Specialty:  Orthopedic Surgery Contact information: [place] [address] [phone]        [facility] Follow up.   Specialty:  [place] Why:  A representative from [facility] will contact you to arrange start date and time for your therapy. Contact information: [address] [place] [address] [phone]            Signed: [name] 03/11/2017, 6:55 AM

## 2017-03-11 NOTE — Progress Notes (Signed)
Pt showered and dressing started to fall off. RN applied a new aquacel dressing.

## 2017-03-12 DIAGNOSIS — Z7951 Long term (current) use of inhaled steroids: Secondary | ICD-10-CM | POA: Diagnosis not present

## 2017-03-12 DIAGNOSIS — I10 Essential (primary) hypertension: Secondary | ICD-10-CM | POA: Diagnosis not present

## 2017-03-12 DIAGNOSIS — R7303 Prediabetes: Secondary | ICD-10-CM | POA: Diagnosis not present

## 2017-03-12 DIAGNOSIS — Z87891 Personal history of nicotine dependence: Secondary | ICD-10-CM | POA: Diagnosis not present

## 2017-03-12 DIAGNOSIS — Z9181 History of falling: Secondary | ICD-10-CM | POA: Diagnosis not present

## 2017-03-12 DIAGNOSIS — M5136 Other intervertebral disc degeneration, lumbar region: Secondary | ICD-10-CM | POA: Diagnosis not present

## 2017-03-12 DIAGNOSIS — J432 Centrilobular emphysema: Secondary | ICD-10-CM | POA: Diagnosis not present

## 2017-03-12 DIAGNOSIS — R269 Unspecified abnormalities of gait and mobility: Secondary | ICD-10-CM | POA: Diagnosis not present

## 2017-03-12 DIAGNOSIS — Z7982 Long term (current) use of aspirin: Secondary | ICD-10-CM | POA: Diagnosis not present

## 2017-03-12 DIAGNOSIS — I2510 Atherosclerotic heart disease of native coronary artery without angina pectoris: Secondary | ICD-10-CM | POA: Diagnosis not present

## 2017-03-12 DIAGNOSIS — Z951 Presence of aortocoronary bypass graft: Secondary | ICD-10-CM | POA: Diagnosis not present

## 2017-03-12 DIAGNOSIS — I471 Supraventricular tachycardia: Secondary | ICD-10-CM | POA: Diagnosis not present

## 2017-03-12 DIAGNOSIS — Z8551 Personal history of malignant neoplasm of bladder: Secondary | ICD-10-CM | POA: Diagnosis not present

## 2017-03-12 DIAGNOSIS — G4733 Obstructive sleep apnea (adult) (pediatric): Secondary | ICD-10-CM | POA: Diagnosis not present

## 2017-03-12 DIAGNOSIS — E785 Hyperlipidemia, unspecified: Secondary | ICD-10-CM | POA: Diagnosis not present

## 2017-03-12 DIAGNOSIS — M4807 Spinal stenosis, lumbosacral region: Secondary | ICD-10-CM | POA: Diagnosis not present

## 2017-03-12 DIAGNOSIS — Z96642 Presence of left artificial hip joint: Secondary | ICD-10-CM | POA: Diagnosis not present

## 2017-03-12 DIAGNOSIS — Z471 Aftercare following joint replacement surgery: Secondary | ICD-10-CM | POA: Diagnosis not present

## 2017-03-15 ENCOUNTER — Telehealth (INDEPENDENT_AMBULATORY_CARE_PROVIDER_SITE_OTHER): Payer: Self-pay

## 2017-03-15 NOTE — Telephone Encounter (Signed)
Carl Lee from [facility] called would like to let Dr [name] know patient was seen on Friday requesting verbal orders for  1x wk for 1wk and then 3x wk for 2 wks has been planned. Contact for therapist is [phone]

## 2017-03-15 NOTE — Telephone Encounter (Signed)
Patient called would like return call to discuss how bad his leg is hurting since his surgery on Tuesday. Taking pain medication. Complains of swelling in leg since his surgery but he is upset that no one explained to him that he was going to have swelling. Please call to discuss.  [phone]

## 2017-03-15 NOTE — Telephone Encounter (Signed)
Patient aware that swelling is normal, to continue to elevate. If his swelling doesn't decrease or he starts to have any calf pain to give me a call back

## 2017-03-15 NOTE — Telephone Encounter (Signed)
Verbal order given to Carl Lee  

## 2017-03-21 DIAGNOSIS — Z9181 History of falling: Secondary | ICD-10-CM | POA: Diagnosis not present

## 2017-03-21 DIAGNOSIS — M5136 Other intervertebral disc degeneration, lumbar region: Secondary | ICD-10-CM | POA: Diagnosis not present

## 2017-03-21 DIAGNOSIS — Z471 Aftercare following joint replacement surgery: Secondary | ICD-10-CM | POA: Diagnosis not present

## 2017-03-21 DIAGNOSIS — Z951 Presence of aortocoronary bypass graft: Secondary | ICD-10-CM | POA: Diagnosis not present

## 2017-03-21 DIAGNOSIS — I471 Supraventricular tachycardia: Secondary | ICD-10-CM | POA: Diagnosis not present

## 2017-03-21 DIAGNOSIS — Z7951 Long term (current) use of inhaled steroids: Secondary | ICD-10-CM | POA: Diagnosis not present

## 2017-03-21 DIAGNOSIS — R7303 Prediabetes: Secondary | ICD-10-CM | POA: Diagnosis not present

## 2017-03-21 DIAGNOSIS — Z96642 Presence of left artificial hip joint: Secondary | ICD-10-CM | POA: Diagnosis not present

## 2017-03-21 DIAGNOSIS — M4807 Spinal stenosis, lumbosacral region: Secondary | ICD-10-CM | POA: Diagnosis not present

## 2017-03-21 DIAGNOSIS — J432 Centrilobular emphysema: Secondary | ICD-10-CM | POA: Diagnosis not present

## 2017-03-21 DIAGNOSIS — Z7982 Long term (current) use of aspirin: Secondary | ICD-10-CM | POA: Diagnosis not present

## 2017-03-21 DIAGNOSIS — I2510 Atherosclerotic heart disease of native coronary artery without angina pectoris: Secondary | ICD-10-CM | POA: Diagnosis not present

## 2017-03-21 DIAGNOSIS — Z87891 Personal history of nicotine dependence: Secondary | ICD-10-CM | POA: Diagnosis not present

## 2017-03-21 DIAGNOSIS — E785 Hyperlipidemia, unspecified: Secondary | ICD-10-CM | POA: Diagnosis not present

## 2017-03-21 DIAGNOSIS — Z8551 Personal history of malignant neoplasm of bladder: Secondary | ICD-10-CM | POA: Diagnosis not present

## 2017-03-21 DIAGNOSIS — I10 Essential (primary) hypertension: Secondary | ICD-10-CM | POA: Diagnosis not present

## 2017-03-21 DIAGNOSIS — G4733 Obstructive sleep apnea (adult) (pediatric): Secondary | ICD-10-CM | POA: Diagnosis not present

## 2017-03-23 ENCOUNTER — Ambulatory Visit (INDEPENDENT_AMBULATORY_CARE_PROVIDER_SITE_OTHER): Payer: PPO | Admitting: Orthopaedic Surgery

## 2017-03-23 DIAGNOSIS — Z96642 Presence of left artificial hip joint: Secondary | ICD-10-CM

## 2017-03-23 MED ORDER — OXYCODONE-ACETAMINOPHEN 5-325 MG PO TABS: 1.0000 | ORAL_TABLET | Freq: Four times a day (QID) | ORAL | 0 refills | Status: DC | PRN

## 2017-03-23 NOTE — Progress Notes (Signed)
The patient is now 2 weeks status post a left total hip arthroplasty direct anterior approach. He said he is doing well overall. Exam of lidocaine. He is already driven. He does need a refill on his pain medication.  On exam his incision looks great. I removed the Steri-Strips and placde new ones. He does have a mild seroma and I drained about 50 mL of fluid off of the hip area. He tolerated this well. He has bruising of his knee and ankle from the surgery itself.  Overall his leg lengths are equal and is doing well. All questions and concerns were answered and addressed. He'll follow-up in 4 weeks' 0 is doing overall.

## 2017-03-28 DIAGNOSIS — G4733 Obstructive sleep apnea (adult) (pediatric): Secondary | ICD-10-CM | POA: Diagnosis not present

## 2017-03-29 ENCOUNTER — Telehealth (INDEPENDENT_AMBULATORY_CARE_PROVIDER_SITE_OTHER): Payer: Self-pay

## 2017-03-29 NOTE — Telephone Encounter (Signed)
Patient called triage phone stating that he is having terrible aching pain in his leg since surgery. States medication provided by Dr [name] is not working. He is not sleeping at night due to pain. Would like a call back to discuss [phone]

## 2017-03-29 NOTE — Telephone Encounter (Signed)
Please advise 

## 2017-03-30 ENCOUNTER — Other Ambulatory Visit (INDEPENDENT_AMBULATORY_CARE_PROVIDER_SITE_OTHER): Payer: Self-pay | Admitting: Orthopaedic Surgery

## 2017-03-30 MED ORDER — IBUPROFEN 800 MG PO TABS: 800.0000 mg | ORAL_TABLET | Freq: Three times a day (TID) | ORAL | 0 refills | Status: DC | PRN

## 2017-03-30 MED ORDER — OXYCODONE-ACETAMINOPHEN 5-325 MG PO TABS: 1.0000 | ORAL_TABLET | Freq: Four times a day (QID) | ORAL | 0 refills | Status: DC | PRN

## 2017-03-30 MED ORDER — TIZANIDINE HCL 4 MG PO TABS: 4.0000 mg | ORAL_TABLET | Freq: Three times a day (TID) | ORAL | 0 refills | Status: DC | PRN

## 2017-03-30 NOTE — Telephone Encounter (Signed)
He's asking if gabapentin will help with this constant ache in the legs  If so can we call in for him

## 2017-03-30 NOTE — Telephone Encounter (Signed)
I sent him to his pharmacy which is [facility] in [place] 800 mg ibuprofen as well as Zanaflex. This will help with his pain and spasms. He can also come and pick up a new prescription for Percocet. It's really the only other medications that we can provide for pain postoperative.

## 2017-03-30 NOTE — Telephone Encounter (Signed)
It will be fine to send and gabapentin 100 mg 3 times daily. #60 with a refill.

## 2017-03-31 ENCOUNTER — Other Ambulatory Visit (INDEPENDENT_AMBULATORY_CARE_PROVIDER_SITE_OTHER): Payer: Self-pay

## 2017-03-31 MED ORDER — GABAPENTIN 100 MG PO CAPS: 100.0000 mg | ORAL_CAPSULE | Freq: Three times a day (TID) | ORAL | 0 refills | Status: DC

## 2017-03-31 NOTE — Telephone Encounter (Signed)
Called into pharmacy

## 2017-03-31 NOTE — Progress Notes (Unsigned)
gab

## 2017-04-01 ENCOUNTER — Encounter: Payer: Self-pay | Admitting: Pulmonary Disease

## 2017-04-01 ENCOUNTER — Ambulatory Visit (INDEPENDENT_AMBULATORY_CARE_PROVIDER_SITE_OTHER): Payer: PPO | Admitting: Pulmonary Disease

## 2017-04-01 VITALS — BP 116/76 | HR 69 | Ht 69.0 in | Wt 200.8 lb

## 2017-04-01 DIAGNOSIS — Z23 Encounter for immunization: Secondary | ICD-10-CM | POA: Diagnosis not present

## 2017-04-01 DIAGNOSIS — F17210 Nicotine dependence, cigarettes, uncomplicated: Secondary | ICD-10-CM | POA: Diagnosis not present

## 2017-04-01 DIAGNOSIS — J432 Centrilobular emphysema: Secondary | ICD-10-CM

## 2017-04-01 NOTE — Patient Instructions (Signed)
COPD: Continue Anoro daily Flu shot today Exercise regularly  For your history of cigarette smoking: I will reach out to our lung cancer screening coordinator to see if you qualify for lung cancer screening  I will see you back in 6-12 months or sooner if needed

## 2017-04-01 NOTE — Progress Notes (Signed)
Subjective:    Patient ID: Carl Lee, male    DOB: 07/31/1955, 61 y.o.   MRN: [id number]  Synopsis: Mr. Olmeda had a CABG in 2015 and pulmonary critical care medicine was consulted for a COPD exacerbation. July 2015 lung function testing showed a ratio 62%, FEV1 of 2.10 L (56% predicted), total lung capacity 6.35 L (80% predicted). He smoked for many years and quit smoking in 2015. April 2017 pulmonary function testing: FEV1 1.8 L, total lung capacity 99% predicted, DLCO 39% predicted 11/12/2015 TTE > normal LVEF, RV mildly dilated and systolic function mildly reduced  HPI  Chief Complaint  Patient presents with  . Follow-up    Pt states that his breathing has been okay. Pt just had a hip replacement x 3 weeks ago, and when was in hospital, O2 sats dropped to the low 80s. Denies any SOB, cough, or CP. Dr. Danise Mina wanted to have lung cancer screening discussed.   Mandy just had his hip replaced recently and he says that he is feeling better.    Breathing is OK right now. While hospitalized he was confused and his oxygen level dropped a few times while under the influence of narcotics.  This was only when he wasn't on the BIPAP.   He had a recurrence of his bladder cancer.    He hasn't had bronchitis or flare ups.   He has been taking his Anoro regularly.    Past Medical History:  Diagnosis Date  . AAA (abdominal aortic aneurysm) ([place]) 03/25/12   s/p endovascular repair  . Adenomatous polyp 06/2009  . Atrial tachycardia, paroxysmal ([place]) 06/18/2016  . Bladder cancer ([place]) 08/16/2008   recurrence 2015 and 2018 treated with office fulguration  . CAD (coronary artery disease), native coronary artery    S/p 3v CABG 01/2014   . Childhood asthma    as a child  . Complication of anesthesia    "takes me a year to get over"  . Contact dermatitis    atypical [name])  . COPD (chronic obstructive pulmonary disease) ([place])   . Coronary artery disease    a.2015: CABG w/ LIMA-LAD,  RIMA-OM, SVG-RCA b. Cath on 07/10/15 w/ patent LIMA-LAD and RIMA-OM. SVG-RCA occluded but collaterals present  . Diverticulosis 2013   severe by CT and colonoscopy  . Environmental allergies    improved as ages  . Fracture of lateral malleolus of left ankle 08/29/2013  . Headache    hasn't had migraines in years  . Hepatic steatosis 06/2015   by [place]  . Hx of migraines   . Hypertension   . Lumbosacral radiculopathy at S1 03/2012   left, with spinal stenosis (MRI 04/2013) improved with TF ESI L5/S1 and S1/2 ([place])  . OSA (obstructive sleep apnea) 09/04/2014   Severe with AHI 35/hr, uses BIPAP  . Overweight (BMI 25.0-29.9) 01/26/2017  . Pre-diabetes   . Prediabetes 06/08/2014   CAD   . Resistance to clopidogrel 2015   drug metabolism panel run - asked to scan  . Spinal stenosis    LS1 nerve root impingement from bulging disc  . Vitamin B12 deficiency   . Zenker diverticulum      Review of Systems  Constitutional: Positive for fatigue. Negative for chills and fever.  HENT: Negative for postnasal drip, rhinorrhea and sinus pressure.   Respiratory: Positive for cough. Negative for shortness of breath and wheezing.   Cardiovascular: Negative for chest pain, palpitations and leg swelling.  Objective:   Physical Exam Vitals:   04/01/17 1556  BP: 116/76  Pulse: 69  SpO2: 94%  Weight: 200 lb 12.8 oz (91.1 kg)  Height: 5\' 9"  (1.753 m)   RA  Gen: well appearing HENT: OP clear, TM's clear, neck supple PULM: CTA B, normal percussion CV: RRR, no mgr, trace edema GI: BS+, soft, nontender Derm: no cyanosis or rash Psyche: normal mood and affect  CBC    Component Value Date/Time   WBC 15.0 (H) 03/09/2017 0549   RBC 4.16 (L) 03/09/2017 0549   HGB 12.9 (L) 03/09/2017 0549   HGB 16.1 02/10/2017 1103   HCT 39.1 03/09/2017 0549   HCT 46.7 02/10/2017 1103   PLT 196 03/09/2017 0549   PLT 291 02/10/2017 1103   MCV 94.0 03/09/2017 0549   MCV 91 02/10/2017 1103   MCV  96 01/09/2014 1237   MCH 31.0 03/09/2017 0549   MCHC 33.0 03/09/2017 0549   RDW 13.4 03/09/2017 0549   RDW 13.6 02/10/2017 1103   RDW 13.5 01/09/2014 1237   LYMPHSABS 2.6 02/10/2017 1103   MONOABS 1,342 (H) 03/19/2016 1121   EOSABS 0.4 02/10/2017 1103   BASOSABS 0.1 02/10/2017 1103      Records from his office visit with cardiology on 05/24/2016 reviewed where his hypertension was managed in addition to ongoing chest pain.    Assessment & Plan:   Centrilobular emphysema ([place])  Cigarette smoker  Discussion: This has been a stable interval for Merlin despite his multiple recent surgeries. He should continue taking Anoro 1  puff daily.  He is interested in lung cancer screening. Even though he does not have Medicare he does meet entry criteria from the original lung cancer screening trial.  Plan: COPD: Continue Anoro daily Flu shot today Exercise regularly  For your history of cigarette smoking: I will reach out to our lung cancer screening coordinator to see if you qualify for lung cancer screening  I will see you back in 6-12 months or sooner if needed    Current Outpatient Prescriptions:  .  albuterol (PROAIR HFA) 108 (90 Base) MCG/ACT inhaler, Inhale 2 puffs into the lungs every 6 (six) hours as needed for wheezing or shortness of breath., Disp: 1 Inhaler, Rfl: 2 .  amLODipine (NORVASC) 5 MG tablet, Take 1 tablet (5 mg total) by mouth daily., Disp: 90 tablet, Rfl: 3 .  aspirin EC 325 MG EC tablet, Take 1 tablet (325 mg total) by mouth daily., Disp: , Rfl:  .  atorvastatin (LIPITOR) 20 MG tablet, TAKE 1 TABLET (20 MG TOTAL) BY MOUTH DAILY AT 6 PM., Disp: 90 tablet, Rfl: 3 .  famotidine (PEPCID) 20 MG tablet, Take 20 mg by mouth 2 (two) times daily. , Disp: , Rfl:  .  fluticasone furoate-vilanterol (BREO ELLIPTA) 100-25 MCG/INH AEPB, Inhale 1 puff into the lungs daily., Disp: 60 each, Rfl: 6 .  gabapentin (NEURONTIN) 100 MG capsule, Take 1 capsule (100 mg total) by mouth 3  (three) times daily., Disp: 60 capsule, Rfl: 0 .  ibuprofen (ADVIL,MOTRIN) 800 MG tablet, Take 1 tablet (800 mg total) by mouth every 8 (eight) hours as needed., Disp: 60 tablet, Rfl: 0 .  metoprolol tartrate (LOPRESSOR) 25 MG tablet, Take 12.5 mg by mouth 2 (two) times daily., Disp: , Rfl:  .  oxyCODONE-acetaminophen (PERCOCET/ROXICET) 5-325 MG tablet, Take 1-2 tablets by mouth every 6 (six) hours as needed for severe pain., Disp: 60 tablet, Rfl: 0 .  tiZANidine (ZANAFLEX) 4 MG tablet, Take  1 tablet (4 mg total) by mouth every 8 (eight) hours as needed for muscle spasms., Disp: 60 tablet, Rfl: 0 .  vitamin B-12 (CYANOCOBALAMIN) 500 MCG tablet, Take 1 tablet (500 mcg total) by mouth daily. (Patient taking differently: Take 500 mcg by mouth every other day. ), Disp: , Rfl:   Current Facility-Administered Medications:  .  umeclidinium-vilanterol (ANORO ELLIPTA) 62.5-25 MCG/INH 1 puff, 1 puff, Inhalation, Daily, Juanito Doom, MD

## 2017-04-04 DIAGNOSIS — L821 Other seborrheic keratosis: Secondary | ICD-10-CM | POA: Diagnosis not present

## 2017-04-04 DIAGNOSIS — D2272 Melanocytic nevi of left lower limb, including hip: Secondary | ICD-10-CM | POA: Diagnosis not present

## 2017-04-04 DIAGNOSIS — D2261 Melanocytic nevi of right upper limb, including shoulder: Secondary | ICD-10-CM | POA: Diagnosis not present

## 2017-04-04 DIAGNOSIS — X32XXXA Exposure to sunlight, initial encounter: Secondary | ICD-10-CM | POA: Diagnosis not present

## 2017-04-04 DIAGNOSIS — L570 Actinic keratosis: Secondary | ICD-10-CM | POA: Diagnosis not present

## 2017-04-04 DIAGNOSIS — D225 Melanocytic nevi of trunk: Secondary | ICD-10-CM | POA: Diagnosis not present

## 2017-04-11 ENCOUNTER — Telehealth: Payer: Self-pay | Admitting: Gastroenterology

## 2017-04-13 ENCOUNTER — Ambulatory Visit (INDEPENDENT_AMBULATORY_CARE_PROVIDER_SITE_OTHER): Payer: PPO | Admitting: Orthopaedic Surgery

## 2017-04-13 DIAGNOSIS — Z96642 Presence of left artificial hip joint: Secondary | ICD-10-CM

## 2017-04-13 NOTE — Progress Notes (Signed)
The patient is now almost 5 weeks status post a left total hip arthroplasty through direct injury approach. He is doing well as far as his hip goes. He still been dealing with some back pain and sciatic pain issues and he has had injections by Dr. Ernestina Patches in the past as a relates to his back and he was consider that again in the future but not now. He says is not hurting bad enough. He still been experiencing some knee aching on the left side as well as some ankle pain is walking without assistive device. He only takes An oxycodone at night to help him sleep.  On examination I can easily put his left hip left knee and left ankle through full motion without difficulties at all. He has no numbness today. There is no knee effusion. There is no weakness.  We'll see him back in a month to see how is doing overall no x-rays are needed.

## 2017-04-13 NOTE — Telephone Encounter (Signed)
The pt is aware that he is in for a recall for procedures for Dec. He will get a letter at that time

## 2017-04-18 ENCOUNTER — Other Ambulatory Visit: Payer: Self-pay | Admitting: Cardiovascular Disease

## 2017-04-18 ENCOUNTER — Other Ambulatory Visit (INDEPENDENT_AMBULATORY_CARE_PROVIDER_SITE_OTHER): Payer: Self-pay | Admitting: Orthopaedic Surgery

## 2017-04-18 ENCOUNTER — Other Ambulatory Visit: Payer: Self-pay | Admitting: Pulmonary Disease

## 2017-04-18 ENCOUNTER — Other Ambulatory Visit: Payer: Self-pay | Admitting: Family Medicine

## 2017-04-18 NOTE — Telephone Encounter (Signed)
Please advise 

## 2017-04-27 DIAGNOSIS — G4733 Obstructive sleep apnea (adult) (pediatric): Secondary | ICD-10-CM | POA: Diagnosis not present

## 2017-05-10 NOTE — Progress Notes (Signed)
Cardiology Office Note:    Date:  05/11/2017   ID:  Carl Lee, DOB [birth year], MRN [id number]  PCP:  [name], MD  Cardiologist:  Dr. Jenkins Lee   Electrophysiologist:  N/a Pulmonology: Dr. [name] GI: Dr. Ardis Hughs  Referring MD: [name], MD   No chief complaint on file.   History of Present Illness:    Carl Lee is a 61 y.o. male with a hx of CAD s/p CABG in 2015 with Dr. Roxan Lee (L-LAD, RIMA-OM, S-PDA), recurrent bladder CA, carotid artery disease, AAA s/p endovascular repair in 9/13 by Dr. Trula Lee, HTN, HL, DM2, OSA, tobacco abuse.   Myoview in 12/16 was intermediate risk with anterior, anteroseptal and apical scar with peri-infarct ischemia.  LHC demonstrated severe native 3 vessel CAD with patent LIMA-LAD and patent free RIMA-OM3. Native RCA was occluded and filled distally from left to right collaterals. SVG-RCA was occluded. Medical therapy was recommended.    VVS follows PVD with last [place] 11/24/15 residual AAA 4.0 cm   Recent titration of CPAP by Dr Carl Lee   Lumbar Radiculopathy with steroid injection 11/11/16   Thinking of buying house in [place] Both sons are there one charters a boat and [place] With him a lot  Had cath 02/11/17 for preop right hip surgery clearance. Stable with patent LIMA to mid LAD, RIMA to OM2 Occluded SVG PDA native RCA fills bridging collaterals EF 45-50%   Uneventful right THR Dr Carl Lee 03/09/17   Has sharp fleeting chest pains that sound muscular  Prior CV studies that were reviewed today include:    Holter Monitor:  Reviewed 03/25/16   SR average HR 72 Isolated PAC;s / PVC;s Some short runs 10-15 beats atrial tachycardia  Echo 11/11/15 Vigorous LVF, EF 65-70%, no RWMA, Gr 1 DD, reduced RVSF, PASP 24 mmHg  24 Hr BP Monitor 4/17 Avg BP in normal range  LHC 07/10/15 LAD 100% LCx ostial 60%, OM1 40%, OM3 40% RCA 100% - dist RCA fills form L-R collats S-RCA 100% L-LAD ok Free RIMA-OM3 ok EF  normal Med Rx  Study Highlights   Myovue 01/28/17     The left ventricular ejection fraction is normal (55-65%).  Nuclear stress EF: 55%.  There was no ST segment deviation noted during stress.  This is a low risk study.   This study is most probably normal. However there are significant perfusion defects in both rest and stress images in the mid and apical anterior and septal walls that are not associated with wall motion abnormalities. These are most probably artifacts. Consider coronary CTA for further evaluation.      Abd/Pelvic CTA 11/15 Patent bilateral renal arteries.  LHC (7/15):  pLAD 30 then 100 (L-L collats), pCFX 80, mCFX 50, pRCA 100 (R-R and L-R collats), EF 50%, ant HK and poss LV apical aneurysm  Nuclear (7/15): Inferior and anteroseptal, mid and dist anterior and dist inferior and apical fixed defect, no ischemia, EF 49%; High Risk  Carotid US (7/15):  Bilateral ICA 1-39%  Past Medical History:  Diagnosis Date  . AAA (abdominal aortic aneurysm) ([place]) 03/25/12   s/p endovascular repair  . Adenomatous polyp 06/2009  . Atrial tachycardia, paroxysmal ([place]) 06/18/2016  . Bladder cancer ([place]) 08/16/2008   recurrence 2015 and 2018 treated with office fulguration  . CAD (coronary artery disease), native coronary artery    S/p 3v CABG 01/2014   . Childhood asthma    as a child  . Complication of anesthesia    "  takes me a year to get over"  . Contact dermatitis    atypical [name])  . COPD (chronic obstructive pulmonary disease) ([place])   . Coronary artery disease    a.2015: CABG w/ LIMA-LAD, RIMA-OM, SVG-RCA b. Cath on 07/10/15 w/ patent LIMA-LAD and RIMA-OM. SVG-RCA occluded but collaterals present  . Diverticulosis 2013   severe by CT and colonoscopy  . Environmental allergies    improved as ages  . Fracture of lateral malleolus of left ankle 08/29/2013  . Headache    hasn't had migraines in years  . Hepatic steatosis 06/2015   by [place]  . Hx  of migraines   . Hypertension   . Lumbosacral radiculopathy at S1 03/2012   left, with spinal stenosis (MRI 04/2013) improved with TF ESI L5/S1 and S1/2 ([place])  . OSA (obstructive sleep apnea) 09/04/2014   Severe with AHI 35/hr, uses BIPAP  . Overweight (BMI 25.0-29.9) 01/26/2017  . Pre-diabetes   . Prediabetes 06/08/2014   CAD   . Resistance to clopidogrel 2015   drug metabolism panel run - asked to scan  . Spinal stenosis    LS1 nerve root impingement from bulging disc  . Vitamin B12 deficiency   . Zenker diverticulum     Past Surgical History:  Procedure Laterality Date  . ABDOMINAL AORTIC ANEURYSM REPAIR  03/25/12   endovascular  . ABI  05/2013   WNL, L TBI low at 0.66  . Bladder cancer  March 2010 and Oct. 2015   [facility]nst Spell) x 2  . CARDIAC CATHETERIZATION N/A 07/10/2015   Procedure: Left Heart Cath and Cors/Grafts Angiography;  Surgeon: Carl Blanks, MD;  Location: [facility];  Service: Cardiovascular;  Laterality: N/A;  . COLONOSCOPY  06/2012   hyperplastic polyp, diverticulosis ([place]) rec rpt 5 yrs  . CORONARY ARTERY BYPASS GRAFT N/A 02/07/2014   Procedure: CORONARY ARTERY BYPASS GRAFTING (CABG);  Surgeon: Carl Nakayama, MD;  Location: [facility];  Service: Open Heart Surgery;  Laterality: N/A;  CABG X 3, BILATERAL LIMA, EVH  . CYSTOSCOPY  08/16/08   Bladder Cancer  . EPIDURAL BLOCK INJECTION Left 09/2014, 10/2014, 12/2014   medial L2,3,4, dorsal L5 ramus blocks x2, L L5/S1 and S1/2 transforaminal ESI ([place])  . ESI  04/2013, 06/2013   L L5S1, S12 transforaminal ESI (Dr.  [name])  . ESI Left 04/2014, 05/2014, 06/2014   L5/S1, S1/2; rpt; L4/5  . ESI  03/2016   R L5/S1 interlaminar ESI  . FULGURATION OF BLADDER TUMOR  01/2017   recurrent 32mm L lateral wall papillary transitional cell carcinoma ([place])  . INTRAOPERATIVE TRANSESOPHAGEAL ECHOCARDIOGRAM N/A 02/07/2014   Procedure: INTRAOPERATIVE TRANSESOPHAGEAL ECHOCARDIOGRAM;  Surgeon:  Carl Nakayama, MD;  Location: [facility];  Service: Open Heart Surgery;  Laterality: N/A;  . LEFT HEART CATH AND CORS/GRAFTS ANGIOGRAPHY N/A 02/11/2017   Procedure: LEFT HEART CATH AND CORS/GRAFTS ANGIOGRAPHY;  Surgeon: Carl Sine, MD;  Location: [facility];  Service: Cardiovascular;  Laterality: N/A;  . LEFT HEART CATHETERIZATION WITH CORONARY ANGIOGRAM N/A 02/01/2014   Procedure: LEFT HEART CATHETERIZATION WITH CORONARY ANGIOGRAM;  Surgeon: Carl Blanks, MD;  Location[facility];  Service: Cardiovascular;  Laterality: N/A;  . PRP epidural injection Left 11/2015   L5/S1, S1/2 transforaminal epidural PRP injections under fluoroscopy ([place])  . TONSILLECTOMY AND ADENOIDECTOMY  1973  . TOTAL HIP ARTHROPLASTY Left 03/08/2017  . TOTAL HIP ARTHROPLASTY Left 03/08/2017   Procedure: LEFT TOTAL HIP ARTHROPLASTY ANTERIOR APPROACH;  Surgeon: [name], MD;  Location: [facility];  Service: Orthopedics;  Laterality: Left;  [name] VASECTOMY      Current Medications: Current Meds  Medication Sig  . albuterol (PROAIR HFA) 108 (90 Base) MCG/ACT inhaler Inhale 2 puffs into the lungs every 6 (six) hours as needed for wheezing or shortness of breath.  [name] amLODipine (NORVASC) 5 MG tablet Take 1 tablet (5 mg total) by mouth daily.  [name] aspirin EC 325 MG EC tablet Take 1 tablet (325 mg total) by mouth daily.  [name] atorvastatin (LIPITOR) 20 MG tablet TAKE 1 TABLET (20 MG TOTAL) BY MOUTH DAILY AT 6 PM.  . BREO ELLIPTA 100-25 MCG/INH AEPB INHALE 1 PUFF INTO THE LUNGS DAILY.  . famotidine (PEPCID) 20 MG tablet TAKE 1 TABLET (20 MG TOTAL) BY MOUTH 2 (TWO) TIMES DAILY.  [name] gabapentin (NEURONTIN) 100 MG capsule TAKE 1 CAPSULE BY MOUTH THREE TIMES A DAY  . metoprolol tartrate (LOPRESSOR) 25 MG tablet TAKE 0.5 TABLETS (12.5 MG TOTAL) BY MOUTH 2 (TWO) TIMES DAILY.  [name] nystatin (MYCOSTATIN) 100000 UNIT/ML suspension Take 5 mLs by mouth daily.  [name] oxyCODONE-acetaminophen (PERCOCET/ROXICET) 5-325 MG  tablet Take 1-2 tablets by mouth every 6 (six) hours as needed for severe pain.  . vitamin B-12 (CYANOCOBALAMIN) 500 MCG tablet Take 1 tablet (500 mcg total) by mouth daily. (Patient taking differently: Take 500 mcg by mouth every other day. )   Current Facility-Administered Medications for the 05/11/17 encounter (Office Visit) with Josue Hector, MD  Medication  . umeclidinium-vilanterol (ANORO ELLIPTA) 62.5-25 MCG/INH 1 puff     Allergies:   Codeine and Doxycycline   Social History   Social History  . Marital status: Married    Spouse name: N/A  . Number of children: 2  . Years of education: N/A   Occupational History  . Self Employed    Social History Main Topics  . Smoking status: Former Smoker    Packs/day: 1.00    Years: 40.00    Types: Cigarettes    Quit date: 02/05/2014  . Smokeless tobacco: Never Used  . Alcohol use 0.0 oz/week     Comment: Rare- states 1 oz. per week  . Drug use: No  . Sexual activity: Not Asked   Other Topics Concern  . None   Social History Narrative   Caffeine: 1 cup coffee/day   Lives with wife, 1 dog   grown children   Occupation: Therapist, sports - self employed.  Disability after CABG   Edu: 2 yrs college   Activity: fishing, walking occasionally   Diet: good water, fruits/vegetables daily     Family History:  The patient's family history includes Arrhythmia in his mother; CAD (age of onset: 84) in his father; COPD in his mother; Cancer in his mother; Dementia in his father; Diabetes in his brother, father, mother, and sister; Heart attack in his father and maternal grandmother; Hyperlipidemia in his mother; Hypertension in his brother and mother; Thyroid disease in his mother.   ROS:   Please see the history of present illness.    Review of Systems  Constitution: Positive for malaise/fatigue.  Eyes: Positive for visual disturbance.  Cardiovascular: Positive for chest pain, dyspnea on exertion and irregular heartbeat.    Respiratory: Positive for wheezing.   Hematologic/Lymphatic: Bruises/bleeds easily.  Skin: Positive for rash.  Musculoskeletal: Positive for back pain.   All other systems reviewed and are negative.   EKGs/Labs/Other Test Reviewed:    EKG:  EKG is  ordered today.  The ekg ordered today demonstrates sinus brady, HR 51, normal axis, septal Q waves, QTc 383 ms, no changes.   Recent Labs: 09/23/2016: TSH 2.76 02/24/2017: ALT 9 03/09/2017: BUN 14; Creatinine, Ser 0.82; Hemoglobin 12.9; Platelets 196; Potassium 3.8; Sodium 136   Recent Lipid Panel    Component Value Date/Time   CHOL 99 06/24/2016 0843   CHOL 170 01/05/2012   TRIG 93.0 06/24/2016 0843   TRIG 141 01/05/2012   HDL 40.30 06/24/2016 0843   CHOLHDL 2 06/24/2016 0843   VLDL 18.6 06/24/2016 0843   LDLCALC 40 06/24/2016 0843   LDLDIRECT 105 01/05/2012     Physical Exam:    VS:  BP 102/60   Pulse (!) 57   Ht 5\' 9"  (1.753 m)   Wt 202 lb 1.9 oz (91.7 kg)   SpO2 92%   BMI 29.85 kg/m     Wt Readings from Last 3 Encounters:  05/11/17 202 lb 1.9 oz (91.7 kg)  04/01/17 200 lb 12.8 oz (91.1 kg)  03/08/17 205 lb 9.6 oz (93.3 kg)     Physical Exam  Constitutional: He is oriented to person, place, and time. He appears well-developed and well-nourished. No distress.  Obese white male  HENT:  Head: Normocephalic and atraumatic.  Eyes: Pupils are equal, round, and reactive to light. No scleral icterus.  Neck: Normal range of motion. No JVD present.  Cardiovascular: Normal rate, regular rhythm, S1 normal and S2 normal.  Exam reveals no gallop and no friction rub.   No murmur heard. Post sternotomy  Pulmonary/Chest: Effort normal and breath sounds normal. He has no decreased breath sounds. He has no wheezes. He has no rhonchi. He has no rales. He exhibits tenderness.  Abdominal: Soft. There is no tenderness.  Musculoskeletal: He exhibits no edema.  Post left anterior hip replacement  Neurological: He is alert and oriented  to person, place, and time.  Skin: Skin is warm and dry.  Psychiatric: He has a normal mood and affect.    ASSESSMENT:    1. Essential hypertension   2. Coronary artery disease involving coronary bypass graft of native heart, angina presence unspecified   3. Dyslipidemia   4. S/P CABG x 3    PLAN:    In order of problems listed above:  1. CAD - s/p CABG. Cath 02/2017 stable anatomy continue medical RX  2. HTN -  His BP runs low at times and he is symptomatic.  I have asked him to decrease Lisinopril to 5 mg QD and to keep an eye on his BP  3. HL - Continue statin.   4. AAA s/p Endovascular repair - FU with VVS.     5. Fatigue - This is a chronic issue.  Check CBC, BMET, TSH.  6. Palpitations - Probably PVCs.  Check 48 Hr Holter.  7. Zenker's Diverticulum - He has chest pain, dysphagia, odynophagia.  He notes food getting stuck in his throat for hours at a time. I have recommended he see GI.  Refer back to Dr. Ardis Hughs.   8. OSA:  CPAP titration per Dr Carl Lee   9. Ortho:  Post Right THR continue PT/OT  Carl Rouge MD [facility]

## 2017-05-11 ENCOUNTER — Ambulatory Visit (INDEPENDENT_AMBULATORY_CARE_PROVIDER_SITE_OTHER): Payer: PPO | Admitting: Cardiovascular Disease

## 2017-05-11 ENCOUNTER — Encounter: Payer: Self-pay | Admitting: Cardiovascular Disease

## 2017-05-11 VITALS — BP 102/60 | HR 57 | Ht 69.0 in | Wt 202.1 lb

## 2017-05-11 DIAGNOSIS — I25810 Atherosclerosis of coronary artery bypass graft(s) without angina pectoris: Secondary | ICD-10-CM | POA: Diagnosis not present

## 2017-05-11 DIAGNOSIS — Z951 Presence of aortocoronary bypass graft: Secondary | ICD-10-CM

## 2017-05-11 DIAGNOSIS — I10 Essential (primary) hypertension: Secondary | ICD-10-CM | POA: Diagnosis not present

## 2017-05-11 DIAGNOSIS — E785 Hyperlipidemia, unspecified: Secondary | ICD-10-CM | POA: Diagnosis not present

## 2017-05-11 NOTE — Patient Instructions (Signed)

## 2017-05-13 ENCOUNTER — Ambulatory Visit: Payer: PPO | Admitting: Cardiovascular Disease

## 2017-05-16 ENCOUNTER — Encounter (INDEPENDENT_AMBULATORY_CARE_PROVIDER_SITE_OTHER): Payer: Self-pay | Admitting: Orthopaedic Surgery

## 2017-05-16 ENCOUNTER — Ambulatory Visit (INDEPENDENT_AMBULATORY_CARE_PROVIDER_SITE_OTHER): Payer: PPO | Admitting: Orthopaedic Surgery

## 2017-05-16 DIAGNOSIS — R351 Nocturia: Secondary | ICD-10-CM | POA: Diagnosis not present

## 2017-05-16 DIAGNOSIS — N401 Enlarged prostate with lower urinary tract symptoms: Secondary | ICD-10-CM | POA: Diagnosis not present

## 2017-05-16 DIAGNOSIS — Z96642 Presence of left artificial hip joint: Secondary | ICD-10-CM

## 2017-05-16 DIAGNOSIS — Z8551 Personal history of malignant neoplasm of bladder: Secondary | ICD-10-CM | POA: Diagnosis not present

## 2017-05-16 MED ORDER — HYDROCODONE-ACETAMINOPHEN 5-325 MG PO TABS: 1.0000 | ORAL_TABLET | Freq: Three times a day (TID) | ORAL | 0 refills | Status: DC | PRN

## 2017-05-16 NOTE — Progress Notes (Signed)
The patient is now 9 weeks status post a left total hip arthroplasty through direct anterior approach.  He is walking without assistive device.  He is having trouble sleeping at night.  He still has issues with his lumbar spine and is been a long-term patient of Dr. Ernestina Patches.  He said he would like to have a consultation with Dr. Ernestina Patches again to consider what his next steps will be with his back.  As far as his hip goes he does have tenderness at his incision he states and generalized leg pain but he says that is doing much better than what he was preop.  He would still like pain medications.  He is on oxycodone and I counseled him about trying to now wean to hydrocodone since his been 9 weeks postoperative.  On exam his incision looks good.  He is a little tender around it but there is no redness no evidence of infection.  Is walking without assistive device.  There is no significant limp.  We talked the importance of the range of motion as well.  His leg lengths are equal.  At this point I did give him a prescription for hydrocodone.  We will work on a consultation visit with Dr. Ernestina Patches.  I will see him back myself in 4 weeks to see how he is doing overall.  No x-rays are needed from me at that visit.

## 2017-05-17 ENCOUNTER — Other Ambulatory Visit (INDEPENDENT_AMBULATORY_CARE_PROVIDER_SITE_OTHER): Payer: Self-pay

## 2017-05-17 DIAGNOSIS — G8929 Other chronic pain: Secondary | ICD-10-CM

## 2017-05-17 DIAGNOSIS — M545 Low back pain: Principal | ICD-10-CM

## 2017-05-19 ENCOUNTER — Telehealth: Payer: Self-pay

## 2017-05-19 NOTE — Telephone Encounter (Signed)
-----   Message from Jeoffrey Massed, RN sent at 02/22/2017  3:58 PM EDT ----- Carl Lee will be set up for a colonoscopy for h/o polyps.  You will be set up for an upper endoscopy for dysphagia.  Hi resolution esophageal manometry. At same time as the EGD and colonoscopy at [facility], in December 2018 to give time to heal from upcoming hip surgery.

## 2017-05-24 ENCOUNTER — Encounter (INDEPENDENT_AMBULATORY_CARE_PROVIDER_SITE_OTHER): Payer: Self-pay | Admitting: Physical Medicine and Rehabilitation

## 2017-05-24 ENCOUNTER — Ambulatory Visit (INDEPENDENT_AMBULATORY_CARE_PROVIDER_SITE_OTHER): Payer: PPO | Admitting: Physical Medicine and Rehabilitation

## 2017-05-24 VITALS — BP 113/69 | HR 60

## 2017-05-24 DIAGNOSIS — M545 Low back pain: Secondary | ICD-10-CM | POA: Diagnosis not present

## 2017-05-24 DIAGNOSIS — G8929 Other chronic pain: Secondary | ICD-10-CM

## 2017-05-24 DIAGNOSIS — M47816 Spondylosis without myelopathy or radiculopathy, lumbar region: Secondary | ICD-10-CM | POA: Diagnosis not present

## 2017-05-24 NOTE — Progress Notes (Deleted)
Lower back pain in center and on the right. Reports it is about the same as before, but the left now does not hurt as much.Both legs ache at night- difficulty falling asleep.

## 2017-05-24 NOTE — Telephone Encounter (Signed)
Patty, Can you contact him To see if he is ready to schedule his colonoscopy for history of adenomatous polyps and at same time EGD to help place esophageal high-res manometry catheter at [facility].  Looking at December or January for this.  He had wanted some time to recover from a hip surgery.  Should be MAC sedated [facility] Thursday for EGD and colonoscopy.  Please let the team at [facility] know he will need hi res esophageal manometry at the same time.

## 2017-05-26 ENCOUNTER — Encounter (INDEPENDENT_AMBULATORY_CARE_PROVIDER_SITE_OTHER): Payer: Self-pay | Admitting: Physical Medicine and Rehabilitation

## 2017-05-26 DIAGNOSIS — G8929 Other chronic pain: Secondary | ICD-10-CM | POA: Diagnosis not present

## 2017-05-26 DIAGNOSIS — M545 Low back pain: Secondary | ICD-10-CM | POA: Diagnosis not present

## 2017-05-26 DIAGNOSIS — M47816 Spondylosis without myelopathy or radiculopathy, lumbar region: Secondary | ICD-10-CM | POA: Diagnosis not present

## 2017-05-26 MED ORDER — TRIAMCINOLONE ACETONIDE 40 MG/ML IJ SUSP
10.0000 mg | INTRAMUSCULAR | Status: AC | PRN
  Administered 2017-05-26: 10 mg via INTRAMUSCULAR

## 2017-05-26 MED ORDER — LIDOCAINE HCL (PF) 1 % IJ SOLN
3.0000 mL | INTRAMUSCULAR | Status: AC | PRN
  Administered 2017-05-26: 3 mL

## 2017-05-26 NOTE — Progress Notes (Signed)
VAYDEN WEINAND - 61 y.o. male MRN [id number]  Date of birth: 08/18/1955  Office Visit Note: Visit Date: 05/24/2017 PCP: [name], MD Referred by: [name], MD  Subjective: Chief Complaint  Patient presents with  . Lower Back - Pain   HPI: Mr. lovingood is a pleasant but complicated medically 61 year old gentleman who I have seen in the past at the request of Dr. [name].  Briefly his history with her was several spinal injections including injections of PRP in the spine which is still controversial.  He began seeing me because she was not taking his insurance.  At the time that I saw him he was having left radicular complaints and a pretty classic L5 or S1 distribution.  He also was having signs and symptoms of left hip pain from some hip pathology.  Ultimately intra-articular hip injection gave him quite a bit of relief and he saw Dr. [name] in our office and is now status post left total hip arthroplasty and doing well with that.  S1 transforaminal injection also ultimately helped his left leg pain to the point where he is really not complaining of that at this point.  His biggest complaint today is after rehabilitation for his hip and walking he is having pretty significant axial low back pain which is midline and is not relieved with pain medication and other medication.  His pain is just to the center into the right.  He feels like it is the same as before except at the time that I saw him again he was having mostly left radicular pain.  He does report that he gets some leg aching at night which is in more of an L3 or 4 distribution.  He had a hard time falling asleep sometimes.  He has a hard time walking in general still although he does better with the hip replacement.  He does get some symptoms into the thighs almost claudication.  Again complicated patient with prior AAA repair as well as prior CABG 2015.  MRI findings from 2017 show moderate multifactorial stenosis at  L4-5 with a left small disc herniation paracentral and at L5-S1.  He does have some arthritis of the lower spine as well.  He is not noted any focal weakness or trauma or falls.  He has had no bowel or bladder issues.  He said no other real medical problems lately.  His symptoms are better with rest in certain positions.  He is worse with standing and ambulating.    Review of Systems  Constitutional: Negative for chills, fever, malaise/fatigue and weight loss.  HENT: Negative for hearing loss and sinus pain.   Eyes: Negative for blurred vision, double vision and photophobia.  Respiratory: Negative for cough and shortness of breath.   Cardiovascular: Negative for chest pain, palpitations and leg swelling.  Gastrointestinal: Negative for abdominal pain, nausea and vomiting.  Genitourinary: Negative for flank pain.  Musculoskeletal: Positive for back pain. Negative for myalgias.  Skin: Negative for itching and rash.  Neurological: Negative for tremors, focal weakness and weakness.  Endo/Heme/Allergies: Negative.   Psychiatric/Behavioral: Negative for depression.  All other systems reviewed and are negative.  Otherwise per HPI.  Assessment & Plan: Visit Diagnoses:  1. Spondylosis without myelopathy or radiculopathy, lumbar region   2. Chronic bilateral low back pain without sciatica     Plan: Findings:  Chronic history of low back pain chronic pain syndrome from multiple musculoskeletal problems and arthritis.  He has had chronic medical conditions  with his heart and vascular system.  His pain today is probably multifactorial I think he is getting some symptoms of stenosis he is getting some symptoms of facet arthropathy on a clearly on exam he has a pretty significant trigger point on the right that does reproduce a lot of his pain as well.  I think the best approach today is a localized trigger point injection with a small bit of cortisone.  He reported that Dr. [name] did not want him to  have any steroid or cortisone medications while he was healing from the hip.  He is 2 months out from the hip replacement.  We would only use a very small amount of cortisone in the trigger point injection just to see if we can get him some relief.  I explained to him a lot of the relief with trigger points comes from the needling technique.  Depending on how he does with that we would look at potentially epidural injection versus facet joint block.  I would get with Dr. [name] at that point to see if it was okay to do a little bit stronger steroid dose.  The patient might do extremely well with radiofrequency ablation eventually.    Meds & Orders: No orders of the defined types were placed in this encounter.   Orders Placed This Encounter  Procedures  . Trigger Point Inj    Follow-up: Return if symptoms worsen or fail to improve, for Possible epidural versus facet joint block.   Procedures: Trigger Point Inj Date/Time: 05/26/2017 5:34 AM Performed by: Magnus Sinning, MD Authorized by: Magnus Sinning, MD   Consent Given by:  Patient Site marked: the procedure site was marked   Timeout: prior to procedure the correct patient, procedure, and site was verified   Indications:  Pain Total # of Trigger Points:  1 Location: back   Needle Size:  25 G Approach:  Dorsal and lateral Medications #1:  10 mg triamcinolone acetonide 40 MG/ML; 3 mL lidocaine (PF) 1 % Patient tolerance:  Patient tolerated the procedure well with no immediate complications Comments: Trigger point was palpated in the right quadratus lumborum and needling technique was utilized.    No notes on file   Clinical History: 11/27/2015 FINDINGS: Severe susceptibility artifact resulting from the aorto bi-iliac stent graft which obscures portions of the lumbar spine suspect she on the STIR images.  Segmentation: Standard.  Alignment: Physiologic.  Vertebrae: Limited evaluation secondary to cystitis did of  the artifact. No fracture, evidence of discitis, or bone lesion.  Conus medullaris: Extends to the L1 level and appears normal.  Paraspinal and other soft tissues: Severe susceptibility artifact resulting from the aorto bi-iliac stent graft .  Disc levels:  Disc spaces: Disc heights are relatively well maintained, but evaluation is limited.  T12-L1: No significant disc protrusion. No evidence of neural foraminal stenosis. No central canal stenosis.  L1-L2: Mild broad-based disc bulge. Mild bilateral facet arthropathy. No evidence of neural foraminal stenosis. No central canal stenosis.  L2-L3: Small central disc protrusion. Mild bilateral facet arthropathy. No evidence of neural foraminal stenosis. No central canal stenosis.  L3-L4: Moderate broad-based disc bulge flattening the ventral thecal sac. Mild bilateral facet arthropathy. Mild spinal stenosis. No foraminal stenosis.  L4-L5: Moderate broad-based disc bulge eccentric towards the right. Moderate bilateral facet arthropathy. Mild -moderate spinal stenosis. No evidence of neural foraminal stenosis.  L5-S1: Broad-based disc bulge with a left paracentral disc protrusion abutting the left intraspinal S1 nerve root. No evidence of  neural foraminal stenosis. No central canal stenosis.  FINDINGS: The craniocervical junction appears unremarkable. No vertebral subluxation is observed. No significant abnormal spinal cord signal is observed. No significant vertebral marrow edema is identified.  Intervertebral disc desiccation is observed at all levels in the cervical spine.  Additional findings at individual levels are as follows:  C2-3:  Unremarkable.  C3-4: Mild bilateral foraminal stenosis and mild central narrowing of the thecal sac eccentric to the left due to left eccentric disc bulge, and facet arthropathy.  C4-5: Mild right foraminal stenosis due to uncinate and facet spurring.  C5-6: Moderate  right and borderline left foraminal stenosis due to disc bulge, uncinate spurring, and facet spurring. The previous left paracentral disc protrusion at this level was less apparent.  C6-7: Mild bilateral foraminal stenosis and borderline central narrowing of the thecal sac due to disc bulge and facet arthropathy.  C7-T1: Mild bilateral foraminal stenosis due to disc bulge, and uncinate spurring.  He reports that he quit smoking about 3 years ago. His smoking use included cigarettes. He has a 40.00 pack-year smoking history. he has never used smokeless tobacco.  Recent Labs    06/24/16 0843 02/24/17 0930  HGBA1C 6.0 6.0*    Objective:  VS:  HT:    WT:   BMI:     BP:113/69  HR:60bpm  TEMP: ( )  RESP:  Physical Exam  Constitutional: He is oriented to person, place, and time. He appears well-developed and well-nourished. No distress.  HENT:  Head: Normocephalic and atraumatic.  Nose: Nose normal.  Mouth/Throat: Oropharynx is clear and moist.  Eyes: Conjunctivae are normal. Pupils are equal, round, and reactive to light.  Neck: Normal range of motion. Neck supple.  Cardiovascular: Regular rhythm and intact distal pulses.  Pulmonary/Chest: Effort normal and breath sounds normal.  Abdominal: Soft. He exhibits no distension.  Musculoskeletal: He exhibits no deformity.  Patient ambulates without aid.  He is a little slow to rise from a seated position.  He has pain with extension rotation right more than left which is concordant with his back pain.  He does have a focal trigger point in the right quadratus lumborum that is also concordant with his pain.  He has no tenderness or pain over the greater trochanters.  His hips move fairly freely.  He has good distal strength without deficit.  Neurological: He is alert and oriented to person, place, and time. He exhibits normal muscle tone. Coordination normal.  Skin: Skin is warm. No rash noted.  Psychiatric: He has a normal mood and  affect. His behavior is normal.  Nursing note and vitals reviewed.   Ortho Exam Imaging: No results found.  Past Medical/Family/Surgical/Social History: Medications & Allergies reviewed per EMR Patient Active Problem List   Diagnosis Date Noted  . Status post total replacement of left hip 03/08/2017  . Abnormal nuclear stress test   . Overweight (BMI 25.0-29.9) 01/26/2017  . Radiculopathy due to lumbar intervertebral disc disorder 10/21/2016  . Osteoarthritis of left hip 08/16/2016  . Atrial tachycardia, paroxysmal ([place]) 06/18/2016  . Chronic fatigue 04/27/2016  . Coronary artery disease   . Abdominal discomfort 06/24/2015  . Hepatic steatosis 06/12/2015  . Right trigeminal neuralgia 11/27/2014  . Chest pain 10/30/2014  . Vitamin B12 deficiency   . OSA (obstructive sleep apnea) 09/04/2014  . Headache 06/20/2014  . Prediabetes 06/08/2014  . Dysphagia 04/19/2014  . Zenker's diverticulum 04/19/2014  . COPD, moderate ([place]) 03/26/2014  . S/P CABG x 3 02/07/2014  .  Occlusion and stenosis of vertebral artery without mention of cerebral infarction 11/12/2013  . Skin rash 04/17/2013  . Healthcare maintenance 01/09/2013  . Dyslipidemia 01/09/2013  . Lumbosacral radiculopathy at S1 09/14/2012  . Hx of migraines   . Arthritis   . History of repair of aneurysm of abdominal aorta using endovascular stent graft 05/01/2012  . Adenomatous polyps 03/24/2012  . Ex-smoker 03/24/2012  . Hypertension 03/24/2012  . Bladder cancer ([place]) 03/24/2012   Past Medical History:  Diagnosis Date  . AAA (abdominal aortic aneurysm) ([place]) 03/25/12   s/p endovascular repair  . Adenomatous polyp 06/2009  . Atrial tachycardia, paroxysmal ([place]) 06/18/2016  . Bladder cancer ([place]) 08/16/2008   recurrence 2015 and 2018 treated with office fulguration  . CAD (coronary artery disease), native coronary artery    S/p 3v CABG 01/2014   . Childhood asthma    as a child  . Complication of anesthesia    "takes  me a year to get over"  . Contact dermatitis    atypical [name])  . COPD (chronic obstructive pulmonary disease) ([place])   . Coronary artery disease    a.2015: CABG w/ LIMA-LAD, RIMA-OM, SVG-RCA b. Cath on 07/10/15 w/ patent LIMA-LAD and RIMA-OM. SVG-RCA occluded but collaterals present  . Diverticulosis 2013   severe by CT and colonoscopy  . Environmental allergies    improved as ages  . Fracture of lateral malleolus of left ankle 08/29/2013  . Headache    hasn't had migraines in years  . Hepatic steatosis 06/2015   by [place]  . Hx of migraines   . Hypertension   . Lumbosacral radiculopathy at S1 03/2012   left, with spinal stenosis (MRI 04/2013) improved with TF ESI L5/S1 and S1/2 ([place])  . OSA (obstructive sleep apnea) 09/04/2014   Severe with AHI 35/hr, uses BIPAP  . Overweight (BMI 25.0-29.9) 01/26/2017  . Pre-diabetes   . Prediabetes 06/08/2014   CAD   . Resistance to clopidogrel 2015   drug metabolism panel run - asked to scan  . Spinal stenosis    LS1 nerve root impingement from bulging disc  . Vitamin B12 deficiency   . Zenker diverticulum    Family History  Problem Relation Age of Onset  . Diabetes Mother   . Arrhythmia Mother        pacemaker  . Cancer Mother        Bladder  . COPD Mother   . Thyroid disease Mother   . Hyperlipidemia Mother   . Hypertension Mother   . Diabetes Father   . CAD Father 73       CHF, MI  . Dementia Father   . Heart attack Father   . Diabetes Sister   . Diabetes Brother   . Hypertension Brother   . Heart attack Maternal Grandmother   . Colon cancer Neg Hx    Past Surgical History:  Procedure Laterality Date  . ABDOMINAL AORTIC ANEURYSM REPAIR  03/25/12   endovascular  . ABI  05/2013   WNL, L TBI low at 0.66  . Bladder cancer  March 2010 and Oct. 2015   [place] Spell) x 2  . CARDIAC CATHETERIZATION N/A 07/10/2015   Procedure: Left Heart Cath and Cors/Grafts Angiography;  Surgeon: [name], MD;  Location:  [place] [facility];  Service: Cardiovascular;  Laterality: N/A;  . COLONOSCOPY  06/2012   hyperplastic polyp, diverticulosis ([place]) rec rpt 5 yrs  . CORONARY ARTERY BYPASS GRAFT N/A 02/07/2014   Procedure:  CORONARY ARTERY BYPASS GRAFTING (CABG);  Surgeon: [name], MD;  Location: [place];  Service: Open Heart Surgery;  Laterality: N/A;  CABG X 3, BILATERAL LIMA, EVH  . CYSTOSCOPY  08/16/08   Bladder Cancer  . EPIDURAL BLOCK INJECTION Left 09/2014, 10/2014, 12/2014   medial L2,3,4, dorsal L5 ramus blocks x2, L L5/S1 and S1/2 transforaminal ESI ([place])  . ESI  04/2013, 06/2013   L L5S1, S12 transforaminal ESI (Dr.  [name])  . ESI Left 04/2014, 05/2014, 06/2014   L5/S1, S1/2; rpt; L4/5  . ESI  03/2016   R L5/S1 interlaminar ESI  . FULGURATION OF BLADDER TUMOR  01/2017   recurrent 50mm L lateral wall papillary transitional cell carcinoma ([place])  . INTRAOPERATIVE TRANSESOPHAGEAL ECHOCARDIOGRAM N/A 02/07/2014   Procedure: INTRAOPERATIVE TRANSESOPHAGEAL ECHOCARDIOGRAM;  Surgeon: [name], MD;  Location: [place];  Service: Open Heart Surgery;  Laterality: N/A;  . LEFT HEART CATH AND CORS/GRAFTS ANGIOGRAPHY N/A 02/11/2017   Procedure: LEFT HEART CATH AND CORS/GRAFTS ANGIOGRAPHY;  Surgeon: [name], MD;  Location: [place] [facility];  Service: Cardiovascular;  Laterality: N/A;  . LEFT HEART CATHETERIZATION WITH CORONARY ANGIOGRAM N/A 02/01/2014   Procedure: LEFT HEART CATHETERIZATION WITH CORONARY ANGIOGRAM;  Surgeon: [name], MD;  Location[facility] C[facility];  Service: Cardiovascular;  Laterality: N/A;  . PRP epidural injection Left 11/2015   L5/S1, S1/2 transforaminal epidural PRP injections under fluoroscopy ([place])  . TONSILLECTOMY AND ADENOIDECTOMY  1973  . TOTAL HIP ARTHROPLASTY Left 03/08/2017  . TOTAL HIP ARTHROPLASTY Left 03/08/2017   Procedure: LEFT TOTAL HIP ARTHROPLASTY ANTERIOR APPROACH;  Surgeon: [name], MD;  Location:  [place];  Service: Orthopedics;  Laterality: Left;  [name] VASECTOMY     Social History   Occupational History  . Occupation: Self Employed  Tobacco Use  . Smoking status: Former Smoker    Packs/day: 1.00    Years: 40.00    Pack years: 40.00    Types: Cigarettes    Last attempt to quit: 02/05/2014    Years since quitting: 3.3  . Smokeless tobacco: Never Used  Substance and Sexual Activity  . Alcohol use: Yes    Alcohol/week: 0.0 oz    Comment: Rare- states 1 oz. per week  . Drug use: No  . Sexual activity: Not on file

## 2017-05-28 DIAGNOSIS — G4733 Obstructive sleep apnea (adult) (pediatric): Secondary | ICD-10-CM | POA: Diagnosis not present

## 2017-06-06 NOTE — Telephone Encounter (Signed)
Patient calling in regarding this. He would like to schedule. Best # [phone]

## 2017-06-07 NOTE — Telephone Encounter (Signed)
See phone note

## 2017-06-07 NOTE — Telephone Encounter (Signed)
-----   Message from Jeoffrey Massed, RN sent at 02/22/2017  3:58 PM EDT ----- Carl Lee will be set up for a colonoscopy for h/o polyps.  You will be set up for an upper endoscopy for dysphagia.  Hi resolution esophageal manometry. At same time as the EGD and colonoscopy at [facility], in December 2018 to give time to heal from upcoming hip surgery.

## 2017-06-10 DIAGNOSIS — G4733 Obstructive sleep apnea (adult) (pediatric): Secondary | ICD-10-CM | POA: Diagnosis not present

## 2017-06-13 ENCOUNTER — Ambulatory Visit (INDEPENDENT_AMBULATORY_CARE_PROVIDER_SITE_OTHER): Payer: PPO | Admitting: Orthopaedic Surgery

## 2017-06-13 NOTE — Telephone Encounter (Signed)
The pt will be scheduled for 07/21/17 at 830 am need to add to schedule and mail instructions

## 2017-06-13 NOTE — Telephone Encounter (Signed)
Dr Ardis Hughs does the esophageal manometry need to be done at the same time as the endo colon.  Carl Lee states they are only done on Monday, Wed, and some fridays.  If you want it done un sedated prior to the endo colon on the same day we have to make sure that they can do it on a Thursday.  Please advise

## 2017-06-15 ENCOUNTER — Encounter (INDEPENDENT_AMBULATORY_CARE_PROVIDER_SITE_OTHER): Payer: Self-pay | Admitting: Orthopaedic Surgery

## 2017-06-15 ENCOUNTER — Ambulatory Visit (INDEPENDENT_AMBULATORY_CARE_PROVIDER_SITE_OTHER): Payer: PPO | Admitting: Orthopaedic Surgery

## 2017-06-15 DIAGNOSIS — Z96642 Presence of left artificial hip joint: Secondary | ICD-10-CM

## 2017-06-15 NOTE — Progress Notes (Signed)
The patient is now just over 3 months status post a left total hip arthroplasty through an anterior approach.  He is married and deep sea fishing and he does have some hip stiffness and some pain down the IT band with knee stiffness with some aching at night but overall he feels like he has made improvements.  Dr. Ernestina Patches has been working with him for his lumbar spine.  Overall though he feels like he is doing well.  On exam his legs are equal.  He tolerates me easily putting his left hip to range of motion.  He does have IT band pain.  His leg lengths are equal.  At this point I will showed him stretching exercises for his IT band and trochanteric bursitis.  I gave him reassurance that hopefully the pain will calm down with time.  From my standpoint I do not need to see him back for 6 months.  I would like a low AP pelvis and lateral of his left operative hip at that visit.

## 2017-06-16 NOTE — Telephone Encounter (Signed)
Yes, the endo is actually going to be to help place the mano tube appropriately.  ASk if [facility] endo can do it on that Thursday.  If not, then lets to it during my next available hosp week (late January I think) on one of the days that works for them.   Thanks

## 2017-06-16 NOTE — Telephone Encounter (Signed)
Call placed to ENDO and Sharee Pimple is unavailable  Message left to have her return call

## 2017-06-16 NOTE — Telephone Encounter (Signed)
Dr Ardis Hughs ENDO says that the manometry has to be done on a separate day to have reliable results.  Please advise

## 2017-06-27 DIAGNOSIS — G4733 Obstructive sleep apnea (adult) (pediatric): Secondary | ICD-10-CM | POA: Diagnosis not present

## 2017-06-28 ENCOUNTER — Telehealth: Payer: Self-pay | Admitting: Gastroenterology

## 2017-06-28 NOTE — Telephone Encounter (Signed)
Sorry for the delay.   These actually have to be done on the same day because I was using the EGD to help place the manometry tube given his known Zenkers diverticulum.   Can it be rescheduled to do the procedures during my next hospital week on a day that manometry can be done? (Wednesday preferable).  The plan will be to do the colonoscopy first, then EGD to help place the manometry tube. Then allow him to wake up over an hour or so and then they can proceed with manometry.   Thanks

## 2017-06-28 NOTE — Telephone Encounter (Signed)
Dr Ardis Hughs per my ;ast message ENDO wants to do the [facility] separately for more accurate results.  Is it ok to have mano on a separate day from endo colon?

## 2017-06-28 NOTE — Telephone Encounter (Signed)
The pt has been scheduled for mano, colon, and endo for 08/10/17 830 am.  Endo was called and scheduled with [facility].  The pt has been advised and per visit scheduled for instructions.

## 2017-06-29 ENCOUNTER — Ambulatory Visit (INDEPENDENT_AMBULATORY_CARE_PROVIDER_SITE_OTHER): Payer: PPO

## 2017-06-29 VITALS — BP 110/70 | HR 57 | Temp 98.4°F | Ht 68.5 in | Wt 204.8 lb

## 2017-06-29 DIAGNOSIS — Z125 Encounter for screening for malignant neoplasm of prostate: Secondary | ICD-10-CM

## 2017-06-29 DIAGNOSIS — Z0000 Encounter for general adult medical examination without abnormal findings: Secondary | ICD-10-CM | POA: Diagnosis not present

## 2017-06-29 DIAGNOSIS — R7303 Prediabetes: Secondary | ICD-10-CM | POA: Diagnosis not present

## 2017-06-29 DIAGNOSIS — D582 Other hemoglobinopathies: Secondary | ICD-10-CM

## 2017-06-29 DIAGNOSIS — E785 Hyperlipidemia, unspecified: Secondary | ICD-10-CM

## 2017-06-29 LAB — LIPID PANEL
CHOL/HDL RATIO: 2
Cholesterol: 103 mg/dL (ref 0–200)
HDL: 43.2 mg/dL (ref >39.00)
LDL CALC: 47 mg/dL (ref 0–99)
NonHDL: 59.55
Triglycerides: 63 mg/dL (ref 0.0–149.0)
VLDL: 12.6 mg/dL (ref 0.0–40.0)

## 2017-06-29 LAB — CBC WITH DIFFERENTIAL/PLATELET
BASOS ABS: 0.1 10*3/uL (ref 0.0–0.1)
Basophils Relative: 0.9 % (ref 0.0–3.0)
EOS ABS: 0.3 10*3/uL (ref 0.0–0.7)
Eosinophils Relative: 4.3 % (ref 0.0–5.0)
HEMATOCRIT: 47.3 % (ref 39.0–52.0)
HEMOGLOBIN: 15.9 g/dL (ref 13.0–17.0)
LYMPHS PCT: 26.4 % (ref 12.0–46.0)
Lymphs Abs: 2 10*3/uL (ref 0.7–4.0)
MCHC: 33.7 g/dL (ref 30.0–36.0)
MCV: 94.9 fl (ref 78.0–100.0)
MONO ABS: 0.7 10*3/uL (ref 0.1–1.0)
Monocytes Relative: 9.1 % (ref 3.0–12.0)
Neutro Abs: 4.6 10*3/uL (ref 1.4–7.7)
Neutrophils Relative %: 59.3 % (ref 43.0–77.0)
Platelets: 248 10*3/uL (ref 150.0–400.0)
RBC: 4.99 Mil/uL (ref 4.22–5.81)
RDW: 14.1 % (ref 11.5–15.5)
WBC: 7.7 10*3/uL (ref 4.0–10.5)

## 2017-06-29 LAB — MICROALBUMIN / CREATININE URINE RATIO
CREATININE, U: 70.6 mg/dL
MICROALB/CREAT RATIO: 1 mg/g (ref 0.0–30.0)

## 2017-06-29 LAB — PSA, MEDICARE: PSA: 0.93 ng/mL (ref 0.10–4.00)

## 2017-06-29 LAB — HEMOGLOBIN A1C: Hgb A1c MFr Bld: 6.4 % (ref 4.6–6.5)

## 2017-06-29 NOTE — Patient Instructions (Addendum)
Mr. Bartol , Thank you for taking time to come for your Medicare Wellness Visit. I appreciate your ongoing commitment to your health goals. Please review the following plan we discussed and let me know if I can assist you in the future.   These are the goals we discussed: Goals    . Increase physical activity     Starting 06/29/2017, I will continue to stretch and to walk 20 minutes 5 days per week.        This is a list of the screening recommended for you and due dates:  Health Maintenance  Topic Date Due  . HIV Screening  06/29/2049*  . Colon Cancer Screening  07/03/2017  . Urine Protein Check  06/29/2018  . DTaP/Tdap/Td vaccine (2 - Td) 12/15/2022  . Tetanus Vaccine  12/15/2022  . Flu Shot  Completed  .  Hepatitis C: One time screening is recommended by Center for Disease Control  (CDC) for  adults born from [birth year] through 1967.   Completed  *Topic was postponed. The date shown is not the original due date.   Preventive Care for Adults  A healthy lifestyle and preventive care can promote health and wellness. Preventive health guidelines for adults include the following key practices.  . A routine yearly physical is a good way to check with your health care provider about your health and preventive screening. It is a chance to share any concerns and updates on your health and to receive a thorough exam.  . Visit your dentist for a routine exam and preventive care every 6 months. Brush your teeth twice a day and floss once a day. Good oral hygiene prevents tooth decay and gum disease.  . The frequency of eye exams is based on your age, health, family medical history, use  of contact lenses, and other factors. Follow your health care provider's recommendations for frequency of eye exams.  . Eat a healthy diet. Foods like vegetables, fruits, whole grains, low-fat dairy products, and lean protein foods contain the nutrients you need without too many calories. Decrease your intake of  foods high in solid fats, added sugars, and salt. Eat the right amount of calories for you. Get information about a proper diet from your health care provider, if necessary.  . Regular physical exercise is one of the most important things you can do for your health. Most adults should get at least 150 minutes of moderate-intensity exercise (any activity that increases your heart rate and causes you to sweat) each week. In addition, most adults need muscle-strengthening exercises on 2 or more days a week.  Silver Sneakers may be a benefit available to you. To determine eligibility, you may visit the website: www.silversneakers.com or contact program at 346-034-9878 Mon-Fri between 8AM-8PM.   . Maintain a healthy weight. The body mass index (BMI) is a screening tool to identify possible weight problems. It provides an estimate of body fat based on height and weight. Your health care provider can find your BMI and can help you achieve or maintain a healthy weight.   For adults 20 years and older: ? A BMI below 18.5 is considered underweight. ? A BMI of 18.5 to 24.9 is normal. ? A BMI of 25 to 29.9 is considered overweight. ? A BMI of 30 and above is considered obese.   . Maintain normal blood lipids and cholesterol levels by exercising and minimizing your intake of saturated fat. Eat a balanced diet with plenty of fruit and vegetables. Blood  tests for lipids and cholesterol should begin at age 72 and be repeated every 5 years. If your lipid or cholesterol levels are high, you are over 50, or you are at high risk for heart disease, you may need your cholesterol levels checked more frequently. Ongoing high lipid and cholesterol levels should be treated with medicines if diet and exercise are not working.  . If you smoke, find out from your health care provider how to quit. If you do not use tobacco, please do not start.  . If you choose to drink alcohol, please do not consume more than 2 drinks per  day. One drink is considered to be 12 ounces (355 mL) of beer, 5 ounces (148 mL) of wine, or 1.5 ounces (44 mL) of liquor.  . If you are 57-23 years old, ask your health care provider if you should take aspirin to prevent strokes.  . Use sunscreen. Apply sunscreen liberally and repeatedly throughout the day. You should seek shade when your shadow is shorter than you. Protect yourself by wearing long sleeves, pants, a wide-brimmed hat, and sunglasses year round, whenever you are outdoors.  . Once a month, do a whole body skin exam, using a mirror to look at the skin on your back. Tell your health care provider of new moles, moles that have irregular borders, moles that are larger than a pencil eraser, or moles that have changed in shape or color.

## 2017-06-29 NOTE — Progress Notes (Signed)
Subjective:   Carl Lee is a 61 y.o. male who presents for an Initial Medicare Annual Wellness Visit.  Review of Systems  N/A Cardiac Risk Factors include: advanced age (>51men, >24 women);male gender;hypertension    Objective:    Today's Vitals   06/29/17 0921 06/29/17 0925  BP: 110/70   Pulse: (!) 57   Temp: 98.4 F (36.9 C)   TempSrc: Oral   SpO2: 96%   Weight: 204 lb 12 oz (92.9 kg)   Height: 5' 8.5" (1.74 m)   PainSc: 5  5   PainLoc: Hip    Body mass index is 30.68 kg/m.  Advanced Directives 06/29/2017 03/09/2017 02/24/2017 02/11/2017 10/25/2016 07/10/2015 11/18/2014  Does Patient Have a Medical Advance Directive? Yes Yes Yes Yes Yes Yes Yes  Type of Paramedic of [place];Living will [place];Living will [place];Living will Living will;Healthcare Power of [place];Living will Living will Living will;Healthcare Power of Attorney  Does patient want to make changes to medical advance directive? - No - Patient declined - - - No - Patient declined No - Patient declined  Copy of [place] in Chart? No - copy requested No - copy requested - Yes Yes No - copy requested Yes  Pre-existing out of facility DNR order (yellow form or pink MOST form) - - - - - - -    Current Medications (verified) Outpatient Encounter Medications as of 06/29/2017  Medication Sig  . albuterol (PROAIR HFA) 108 (90 Base) MCG/ACT inhaler Inhale 2 puffs into the lungs every 6 (six) hours as needed for wheezing or shortness of breath.  [name] amLODipine (NORVASC) 5 MG tablet Take 1 tablet (5 mg total) by mouth daily.  [name] aspirin EC 325 MG EC tablet Take 1 tablet (325 mg total) by mouth daily.  [name] atorvastatin (LIPITOR) 20 MG tablet TAKE 1 TABLET (20 MG TOTAL) BY MOUTH DAILY AT 6 PM.  . BREO ELLIPTA 100-25 MCG/INH AEPB INHALE 1 PUFF INTO THE LUNGS DAILY.  . famotidine (PEPCID) 20 MG tablet TAKE 1 TABLET  (20 MG TOTAL) BY MOUTH 2 (TWO) TIMES DAILY.  . metoprolol tartrate (LOPRESSOR) 25 MG tablet TAKE 0.5 TABLETS (12.5 MG TOTAL) BY MOUTH 2 (TWO) TIMES DAILY.  [name] nystatin (MYCOSTATIN) 100000 UNIT/ML suspension Take 5 mLs by mouth as needed.   . vitamin B-12 (CYANOCOBALAMIN) 500 MCG tablet Take 1 tablet (500 mcg total) by mouth daily. (Patient taking differently: Take 500 mcg by mouth every other day. )  . gabapentin (NEURONTIN) 100 MG capsule TAKE 1 CAPSULE BY MOUTH THREE TIMES A DAY (Patient not taking: Reported on 05/24/2017)  . HYDROcodone-acetaminophen (NORCO/VICODIN) 5-325 MG tablet Take 1-2 tablets 3 (three) times daily as needed by mouth for moderate pain. (Patient not taking: Reported on 06/29/2017)  . oxyCODONE-acetaminophen (PERCOCET/ROXICET) 5-325 MG tablet Take 1-2 tablets by mouth every 6 (six) hours as needed for severe pain. (Patient not taking: Reported on 05/24/2017)   Facility-Administered Encounter Medications as of 06/29/2017  Medication  . umeclidinium-vilanterol (ANORO ELLIPTA) 62.5-25 MCG/INH 1 puff    Allergies (verified) Codeine and Doxycycline   History: Past Medical History:  Diagnosis Date  . AAA (abdominal aortic aneurysm) ([place]) 03/25/12   s/p endovascular repair  . Adenomatous polyp 06/2009  . Atrial tachycardia, paroxysmal ([place]) 06/18/2016  . Bladder cancer ([place]) 08/16/2008   recurrence 2015 and 2018 treated with office fulguration  . CAD (coronary artery disease), native coronary artery  S/p 3v CABG 01/2014   . Childhood asthma    as a child  . Complication of anesthesia    "takes me a year to get over"  . Contact dermatitis    atypical [name])  . COPD (chronic obstructive pulmonary disease) ([place])   . Coronary artery disease    a.2015: CABG w/ LIMA-LAD, RIMA-OM, SVG-RCA b. Cath on 07/10/15 w/ patent LIMA-LAD and RIMA-OM. SVG-RCA occluded but collaterals present  . Diverticulosis 2013   severe by CT and colonoscopy  . Environmental allergies     improved as ages  . Fracture of lateral malleolus of left ankle 08/29/2013  . Headache    hasn't had migraines in years  . Hepatic steatosis 06/2015   by [place]  . Hx of migraines   . Hypertension   . Lumbosacral radiculopathy at S1 03/2012   left, with spinal stenosis (MRI 04/2013) improved with TF ESI L5/S1 and S1/2 ([place])  . OSA (obstructive sleep apnea) 09/04/2014   Severe with AHI 35/hr, uses BIPAP  . Overweight (BMI 25.0-29.9) 01/26/2017  . Pre-diabetes   . Prediabetes 06/08/2014   CAD   . Resistance to clopidogrel 2015   drug metabolism panel run - asked to scan  . Spinal stenosis    LS1 nerve root impingement from bulging disc  . Vitamin B12 deficiency   . [place] diverticulum    Past Surgical History:  Procedure Laterality Date  . ABDOMINAL AORTIC ANEURYSM REPAIR  03/25/12   endovascular  . ABI  05/2013   WNL, L TBI low at 0.66  . Bladder cancer  March 2010 and Oct. 2015   [facility] Spell) x 2  . CARDIAC CATHETERIZATION N/A 07/10/2015   Procedure: Left Heart Cath and Cors/Grafts Angiography;  Surgeon: [name], MD;  Location: [facility] CV LAB;  Service: Cardiovascular;  Laterality: N/A;  . COLONOSCOPY  06/2012   hyperplastic polyp, diverticulosis ([place]) rec rpt 5 yrs  . CORONARY ARTERY BYPASS GRAFT N/A 02/07/2014   Procedure: CORONARY ARTERY BYPASS GRAFTING (CABG);  Surgeon: [name], MD;  Location: [facility];  Service: Open Heart Surgery;  Laterality: N/A;  CABG X 3, BILATERAL LIMA, EVH  . CYSTOSCOPY  08/16/08   Bladder Cancer  . EPIDURAL BLOCK INJECTION Left 09/2014, 10/2014, 12/2014   medial L2,3,4, dorsal L5 ramus blocks x2, L L5/S1 and S1/2 transforaminal ESI ([place])  . ESI  04/2013, 06/2013   L L5S1, S12 transforaminal ESI (Dr.  [name])  . ESI Left 04/2014, 05/2014, 06/2014   L5/S1, S1/2; rpt; L4/5  . ESI  03/2016   R L5/S1 interlaminar ESI  . FULGURATION OF BLADDER TUMOR  01/2017   recurrent 68mm L lateral wall papillary  transitional cell carcinoma ([place])  . INTRAOPERATIVE TRANSESOPHAGEAL ECHOCARDIOGRAM N/A 02/07/2014   Procedure: INTRAOPERATIVE TRANSESOPHAGEAL ECHOCARDIOGRAM;  Surgeon: [name], MD;  Location: [facility];  Service: Open Heart Surgery;  Laterality: N/A;  . LEFT HEART CATH AND CORS/GRAFTS ANGIOGRAPHY N/A 02/11/2017   Procedure: LEFT HEART CATH AND CORS/GRAFTS ANGIOGRAPHY;  Surgeon: [name], MD;  Location: [place] [facility] CV LAB;  Service: Cardiovascular;  Laterality: N/A;  . LEFT HEART CATHETERIZATION WITH CORONARY ANGIOGRAM N/A 02/01/2014   Procedure: LEFT HEART CATHETERIZATION WITH CORONARY ANGIOGRAM;  Surgeon: [name], MD;  Location: [facility] CATH LAB;  Service: Cardiovascular;  Laterality: N/A;  . PRP epidural injection Left 11/2015   L5/S1, S1/2 transforaminal epidural PRP injections under fluoroscopy ([place])  . TONSILLECTOMY AND ADENOIDECTOMY  1973  .  TOTAL HIP ARTHROPLASTY Left 03/08/2017  . TOTAL HIP ARTHROPLASTY Left 03/08/2017   Procedure: LEFT TOTAL HIP ARTHROPLASTY ANTERIOR APPROACH;  Surgeon: [name], MD;  Location: [facility];  Service: Orthopedics;  Laterality: Left;  [name] VASECTOMY     Family History  Problem Relation Age of Onset  . Diabetes Mother   . Arrhythmia Mother        pacemaker  . Cancer Mother        Bladder  . COPD Mother   . Thyroid disease Mother   . Hyperlipidemia Mother   . Hypertension Mother   . Diabetes Father   . CAD Father 53       CHF, MI  . Dementia Father   . Heart attack Father   . Diabetes Sister   . Diabetes Brother   . Hypertension Brother   . Heart attack Maternal Grandmother   . Colon cancer Neg Hx    Social History   Socioeconomic History  . Marital status: Married    Spouse name: None  . Number of children: 2  . Years of education: None  . Highest education level: None  Social Needs  . Financial resource strain: None  . Food insecurity - worry: None  . Food insecurity - inability: None  .  Transportation needs - medical: None  . Transportation needs - non-medical: None  Occupational History  . Occupation: Self Employed  Tobacco Use  . Smoking status: Former Smoker    Packs/day: 1.00    Years: 40.00    Pack years: 40.00    Types: Cigarettes    Last attempt to quit: 02/05/2014    Years since quitting: 3.3  . Smokeless tobacco: Never Used  Substance and Sexual Activity  . Alcohol use: Yes    Alcohol/week: 0.0 oz    Comment: Rare- states 1 oz. per week  . Drug use: No  . Sexual activity: None  Other Topics Concern  . None  Social History Narrative   Caffeine: 1 cup coffee/day   Lives with wife, 1 dog   grown children   Occupation: Therapist, sports - self employed.  Disability after CABG   Edu: 2 yrs college   Activity: fishing, walking occasionally   Diet: good water, fruits/vegetables daily   Tobacco Counseling Counseling given: No   Clinical Intake:  Pre-visit preparation completed: Yes  Pain : 0-10 Pain Score: 5  Pain Type: Chronic pain Pain Location: Hip Pain Orientation: Left Pain Onset: More than a month ago Pain Frequency: Constant     Nutritional Status: BMI 25 -29 Overweight Nutritional Risks: None Diabetes: No  How often do you need to have someone help you when you read instructions, pamphlets, or other written materials from your doctor or pharmacy?: 1 - Never What is the last grade level you completed in school?: 12th grade + trade school  Interpreter Needed?: No  Comments: pt lives with spouse Information entered by :: LPinson, LPN  Activities of Daily Living In your present state of health, do you have any difficulty performing the following activities: 06/29/2017 03/09/2017  Hearing? N -  Vision? Y -  Comment cataracts in both eyes -  Difficulty concentrating or making decisions? Y -  Walking or climbing stairs? Y -  Dressing or bathing? N -  Doing errands, shopping? N Y  Conservation officer, nature and eating ? N -  Using the  Toilet? N -  In the past six months, have you accidently leaked urine? N -  Do you have problems with loss of bowel control? N -  Managing your Medications? N -  Managing your Finances? N -  Housekeeping or managing your Housekeeping? N -  Some recent data might be hidden     Immunizations and Health Maintenance Immunization History  Administered Date(s) Administered  . Influenza,inj,Quad PF,6+ Mos 04/17/2013, 06/17/2014, 05/01/2015, 03/25/2016, 04/01/2017  . Pneumococcal Conjugate-13 05/01/2015  . Pneumococcal Polysaccharide-23 06/17/2014  . Tdap 12/14/2012  . Zoster 12/23/2015   There are no preventive care reminders to display for this patient.  Patient Care Team: [name], MD as PCP - General (Family Medicine) [name], MD (Gastroenterology) [name], MD as Consulting Physician (Cardiology) Josue Hector, MD as Consulting Physician (Cardiology) Juanito Doom, MD as Consulting Physician (Pulmonary Disease) [place], Fabio Asa, MD (Physical Medicine and Rehabilitation) [name], MD as Consulting Physician (Orthopedic Surgery)  Indicate any recent Medical Services you may have received from other than [facility] providers in the past year (date may be approximate).    Assessment:   This is a routine wellness examination for [place].  Hearing/Vision screen  Hearing Screening   125Hz  250Hz  500Hz  1000Hz  2000Hz  3000Hz  4000Hz  6000Hz  8000Hz   Right ear:   40 0 0  0    Left ear:   40 40 40  40    Vision Screening Comments: Dr. Matilde Sprang in 12/2016  Dietary issues and exercise activities discussed: Current Exercise Habits: Home exercise routine, Type of exercise: walking;stretching, Time (Minutes): 20, Frequency (Times/Week): 5, Weekly Exercise (Minutes/Week): 100, Intensity: Mild, Exercise limited by: orthopedic condition(s)  Goals    . Increase physical activity     Starting 06/29/2017, I will continue to stretch and to walk 20 minutes  5 days per week.       Depression Screen PHQ 2/9 Scores 06/29/2017 08/27/2015 07/24/2014 04/17/2014  PHQ - 2 Score 0 0 1 0  PHQ- 9 Score 0 - - -    Fall Risk Fall Risk  06/29/2017 08/27/2015  Falls in the past year? Yes Yes  Comment lost balance while walking at night on vacation -  Number falls in past yr: 1 2 or more  Injury with Fall? No No  Risk for fall due to : - History of fall(s)  Follow up - Falls evaluation completed;Education provided  Cognitive Function: MMSE - Mini Mental State Exam 06/29/2017  Orientation to time 5  Orientation to Place 5  Registration 3  Attention/ Calculation 0  Recall 3  Language- name 2 objects 0  Language- repeat 1  Language- follow 3 step command 3  Language- read & follow direction 0  Write a sentence 0  Copy design 0  Total score 20     PLEASE NOTE: A Mini-Cog screen was completed. Maximum score is 20. A value of 0 denotes this part of [place] MMSE was not completed or the patient failed this part of the Mini-Cog screening.   Mini-Cog Screening Orientation to Time - Max 5 pts Orientation to Place - Max 5 pts Registration - Max 3 pts Recall - Max 3 pts Language Repeat - Max 1 pts Language Follow 3 Step Command - Max 3 pts     Screening Tests Health Maintenance  Topic Date Due  . HIV Screening  06/29/2049 (Originally 03/27/1971)  . COLONOSCOPY  07/03/2017  . URINE MICROALBUMIN  06/29/2018  . DTaP/Tdap/Td (2 - Td) 12/15/2022  . TETANUS/TDAP  12/15/2022  . INFLUENZA VACCINE  Completed  . Hepatitis C  Screening  Completed         Plan:     I have personally reviewed, addressed, and noted the following in the patient's chart:  A. Medical and social history B. Use of alcohol, tobacco or illicit drugs  C. Current medications and supplements D. Functional ability and status E.  Nutritional status F.  Physical activity G. Advance directives H. List of other physicians I.  Hospitalizations, surgeries, and ER visits in  previous 12 months J.  [place] to include hearing, vision, cognitive, depression L. Referrals and appointments - none  In addition, I have reviewed and discussed with patient certain preventive protocols, quality metrics, and best practice recommendations. A written personalized care plan for preventive services as well as general preventive health recommendations were provided to patient.  See attached scanned questionnaire for additional information.   Signed,   Lindell Noe, MHA, BS, LPN Health Coach

## 2017-06-29 NOTE — Progress Notes (Signed)
Pre visit review using our clinic review tool, if applicable. No additional management support is needed unless otherwise documented below in the visit note. 

## 2017-06-29 NOTE — Progress Notes (Signed)
PCP notes:   Health maintenance:  Microalbumin - completed HIV screening - declined  Abnormal screenings:   Fall risk - hx of fall without injury Hearing - failed  Hearing Screening   125Hz  250Hz  500Hz  1000Hz  2000Hz  3000Hz  4000Hz  6000Hz  8000Hz   Right ear:   40 0 0  0    Left ear:   40 40 40  40     Patient concerns:   None  Nurse concerns:  None  Next PCP appt:   06/30/17 @ 1030

## 2017-06-30 ENCOUNTER — Ambulatory Visit (INDEPENDENT_AMBULATORY_CARE_PROVIDER_SITE_OTHER): Payer: PPO | Admitting: Family Medicine

## 2017-06-30 ENCOUNTER — Encounter: Payer: Self-pay | Admitting: Family Medicine

## 2017-06-30 VITALS — BP 122/80 | HR 61 | Temp 98.0°F | Ht 70.0 in | Wt 203.5 lb

## 2017-06-30 DIAGNOSIS — K225 Diverticulum of esophagus, acquired: Secondary | ICD-10-CM

## 2017-06-30 DIAGNOSIS — R350 Frequency of micturition: Secondary | ICD-10-CM

## 2017-06-30 DIAGNOSIS — E785 Hyperlipidemia, unspecified: Secondary | ICD-10-CM

## 2017-06-30 DIAGNOSIS — Z8551 Personal history of malignant neoplasm of bladder: Secondary | ICD-10-CM

## 2017-06-30 DIAGNOSIS — I10 Essential (primary) hypertension: Secondary | ICD-10-CM | POA: Diagnosis not present

## 2017-06-30 DIAGNOSIS — R5382 Chronic fatigue, unspecified: Secondary | ICD-10-CM | POA: Diagnosis not present

## 2017-06-30 DIAGNOSIS — J449 Chronic obstructive pulmonary disease, unspecified: Secondary | ICD-10-CM | POA: Diagnosis not present

## 2017-06-30 DIAGNOSIS — Z0000 Encounter for general adult medical examination without abnormal findings: Secondary | ICD-10-CM

## 2017-06-30 DIAGNOSIS — R7303 Prediabetes: Secondary | ICD-10-CM | POA: Diagnosis not present

## 2017-06-30 DIAGNOSIS — Z87891 Personal history of nicotine dependence: Secondary | ICD-10-CM

## 2017-06-30 NOTE — Assessment & Plan Note (Signed)
Chronic, stable. Continue current regimen. 

## 2017-06-30 NOTE — Assessment & Plan Note (Signed)
Remains abstinent 

## 2017-06-30 NOTE — Assessment & Plan Note (Signed)
myrbetriq helpful but expensive.

## 2017-06-30 NOTE — Patient Instructions (Addendum)
Bring [place] copy of your living will to update your chart.  You are doing well today.  Return as needed or in 6 months for sugar follow up visit.   Health Maintenance, Male A healthy lifestyle and preventive care is important for your health and wellness. Ask your health care provider about what schedule of regular examinations is right for you. What should I know about weight and diet? Eat a Healthy Diet  Eat plenty of vegetables, fruits, whole grains, low-fat dairy products, and lean protein.  Do not eat a lot of foods high in solid fats, added sugars, or salt.  Maintain a Healthy Weight Regular exercise can help you achieve or maintain a healthy weight. You should:  Do at least 150 minutes of exercise each week. The exercise should increase your heart rate and make you sweat (moderate-intensity exercise).  Do strength-training exercises at least twice a week.  Watch Your Levels of Cholesterol and Blood Lipids  Have your blood tested for lipids and cholesterol every 5 years starting at 63 years of age. If you are at high risk for heart disease, you should start having your blood tested when you are 63 years old. You may need to have your cholesterol levels checked more often if: ? Your lipid or cholesterol levels are high. ? You are older than 63 years of age. ? You are at high risk for heart disease.  What should I know about cancer screening? Many types of cancers can be detected early and may often be prevented. Lung Cancer  You should be screened every year for lung cancer if: ? You are a current smoker who has smoked for at least 30 years. ? You are a former smoker who has quit within the past 15 years.  Talk to your health care provider about your screening options, when you should start screening, and how often you should be screened.  Colorectal Cancer  Routine colorectal cancer screening usually begins at 63 years of age and should be repeated every 5-10 years until you  are 63 years old. You may need to be screened more often if early forms of precancerous polyps or small growths are found. Your health care provider may recommend screening at an earlier age if you have risk factors for colon cancer.  Your health care provider may recommend using home test kits to check for hidden blood in the stool.  A small camera at the end of a tube can be used to examine your colon (sigmoidoscopy or colonoscopy). This checks for the earliest forms of colorectal cancer.  Prostate and Testicular Cancer  Depending on your age and overall health, your health care provider may do certain tests to screen for prostate and testicular cancer.  Talk to your health care provider about any symptoms or concerns you have about testicular or prostate cancer.  Skin Cancer  Check your skin from head to toe regularly.  Tell your health care provider about any new moles or changes in moles, especially if: ? There is a change in a mole's size, shape, or color. ? You have a mole that is larger than a pencil eraser.  Always use sunscreen. Apply sunscreen liberally and repeat throughout the day.  Protect yourself by wearing long sleeves, pants, a wide-brimmed hat, and sunglasses when outside.  What should I know about heart disease, diabetes, and high blood pressure?  If you are 82-23 years of age, have your blood pressure checked every 3-5 years. If you are  72 years of age or older, have your blood pressure checked every year. You should have your blood pressure measured twice-once when you are at a hospital or clinic, and once when you are not at a hospital or clinic. Record the average of the two measurements. To check your blood pressure when you are not at a hospital or clinic, you can use: ? An automated blood pressure machine at a pharmacy. ? A home blood pressure monitor.  Talk to your health care provider about your target blood pressure.  If you are between 83-71 years old,  ask your health care provider if you should take aspirin to prevent heart disease.  Have regular diabetes screenings by checking your fasting blood sugar level. ? If you are at a normal weight and have a low risk for diabetes, have this test once every three years after the age of 87. ? If you are overweight and have a high risk for diabetes, consider being tested at a younger age or more often.  A one-time screening for abdominal aortic aneurysm (AAA) by ultrasound is recommended for men aged 59-75 years who are current or former smokers. What should I know about preventing infection? Hepatitis B If you have a higher risk for hepatitis B, you should be screened for this virus. Talk with your health care provider to find out if you are at risk for hepatitis B infection. Hepatitis C Blood testing is recommended for:  Everyone born from [birth year] through 1967.  Anyone with known risk factors for hepatitis C.  Sexually Transmitted Diseases (STDs)  You should be screened each year for STDs including gonorrhea and chlamydia if: ? You are sexually active and are younger than 63 years of age. ? You are older than 63 years of age and your health care provider tells you that you are at risk for this type of infection. ? Your sexual activity has changed since you were last screened and you are at an increased risk for chlamydia or gonorrhea. Ask your health care provider if you are at risk.  Talk with your health care provider about whether you are at high risk of being infected with HIV. Your health care provider may recommend a prescription medicine to help prevent HIV infection.  What else can I do?  Schedule regular health, dental, and eye exams.  Stay current with your vaccines (immunizations).  Do not use any tobacco products, such as cigarettes, chewing tobacco, and e-cigarettes. If you need help quitting, ask your health care provider.  Limit alcohol intake to no more than 2 drinks per  day. One drink equals 12 ounces of beer, 5 ounces of wine, or 1 ounces of hard liquor.  Do not use street drugs.  Do not share needles.  Ask your health care provider for help if you need support or information about quitting drugs.  Tell your health care provider if you often feel depressed.  Tell your health care provider if you have ever been abused or do not feel safe at home. This information is not intended to replace advice given to you by your health care provider. Make sure you discuss any questions you have with your health care provider. Document Released: 12/25/2007 Document Revised: 02/25/2016 Document Reviewed: 04/01/2015 Elsevier Interactive Patient Education  [employer].

## 2017-06-30 NOTE — Assessment & Plan Note (Signed)
Chronic, stable on lipitor.  The ASCVD Risk score Mikey Bussing DC Jr., et al., 2013) failed to calculate for the following reasons:   The valid total cholesterol range is 130 to 320 mg/dL

## 2017-06-30 NOTE — Assessment & Plan Note (Signed)
Continue breo daily.

## 2017-06-30 NOTE — Assessment & Plan Note (Signed)
Followed yearly by urology.  

## 2017-06-30 NOTE — Assessment & Plan Note (Signed)
Preventative protocols reviewed and updated unless pt declined. Discussed healthy diet and lifestyle.  

## 2017-06-30 NOTE — Assessment & Plan Note (Addendum)
Upcoming GI eval next month (colonoscopy,EGD, esoph manometry)

## 2017-06-30 NOTE — Progress Notes (Signed)
BP 122/80 (BP Location: Left Arm, Patient Position: Sitting, Cuff Size: Normal)   Pulse 61   Temp 98 F (36.7 C) (Oral)   Ht 5\' 10"  (1.778 m)   Wt 203 lb 8 oz (92.3 kg)   SpO2 96%   BMI 29.20 kg/m    CC: CPE Subjective:    Patient ID: Carl Lee, male    DOB: [birth year], 61 y.o.   MRN: [id number]  HPI: Carl Lee is a 61 y.o. male presenting on 06/30/2017 for Annual Exam (Pt 2)   Saw Katha Cabal yesterday for medicare wellness visit. Note reviewed. Failed R hearing screen. Worried about cost of audiology evaluation so declines.  S/p L hip replacement.  Sees GI for known zenker's diverticulum. Upcoming colonoscopy, esophageal manometry.  myrbetriq has been helpful but very expensive.   Preventative: Colonoscopy 06/2012 hyperplastic polyp, diverticulosis Ardis Hughs) rec rpt 5 yrs.  Prostate cancer screening - no fmhx. Screen every few years (2018).  Flu shot yearly  Pneumovax 06/2014, prevnar 04/2015  Tdap 12/2012 zostavax - 12/2015 shingrix - discussed - will postpone Advanced directive - has living will at home. HCPOA is wife. Asked to bring [place] copy Seat belt use discussed  Sunscreen use discussed, wants mole on back evaluated Ex smoker - quit 2015 Alcohol - 1 drink per month  Caffeine: 1 cup coffee/day  Lives with wife, 1 dog  Grown children  Occupation: Therapist, sports - self employed  Edu: 2 yrs college  Activity: fishing, walking occasionally  Diet: good water, fruits/vegetables daily  Relevant past medical, surgical, family and social history reviewed and updated as indicated. Interim medical history since our last visit reviewed. Allergies and medications reviewed and updated. Outpatient Medications Prior to Visit  Medication Sig Dispense Refill  . albuterol (PROAIR HFA) 108 (90 Base) MCG/ACT inhaler Inhale 2 puffs into the lungs every 6 (six) hours as needed for wheezing or shortness of breath. 1 Inhaler 2  . amLODipine (NORVASC) 5 MG tablet Take 1 tablet  (5 mg total) by mouth daily. 90 tablet 3  . aspirin EC 325 MG EC tablet Take 1 tablet (325 mg total) by mouth daily.    [name] atorvastatin (LIPITOR) 20 MG tablet TAKE 1 TABLET (20 MG TOTAL) BY MOUTH DAILY AT 6 PM. 90 tablet 3  . BREO ELLIPTA 100-25 MCG/INH AEPB INHALE 1 PUFF INTO THE LUNGS DAILY. 60 each 5  . famotidine (PEPCID) 20 MG tablet TAKE 1 TABLET (20 MG TOTAL) BY MOUTH 2 (TWO) TIMES DAILY. 180 tablet 3  . metoprolol tartrate (LOPRESSOR) 25 MG tablet TAKE 0.5 TABLETS (12.5 MG TOTAL) BY MOUTH 2 (TWO) TIMES DAILY. 90 tablet 2  . nystatin (MYCOSTATIN) 100000 UNIT/ML suspension Take 5 mLs by mouth as needed.   2  . vitamin B-12 (CYANOCOBALAMIN) 500 MCG tablet Take 1 tablet (500 mcg total) by mouth daily. (Patient taking differently: Take 500 mcg by mouth every other day. )    . gabapentin (NEURONTIN) 100 MG capsule TAKE 1 CAPSULE BY MOUTH THREE TIMES A DAY (Patient not taking: Reported on 05/24/2017) 60 capsule 0  . HYDROcodone-acetaminophen (NORCO/VICODIN) 5-325 MG tablet Take 1-2 tablets 3 (three) times daily as needed by mouth for moderate pain. (Patient not taking: Reported on 06/29/2017) 60 tablet 0  . oxyCODONE-acetaminophen (PERCOCET/ROXICET) 5-325 MG tablet Take 1-2 tablets by mouth every 6 (six) hours as needed for severe pain. (Patient not taking: Reported on 05/24/2017) 60 tablet 0   Facility-Administered Medications Prior to Visit  Medication Dose Route  Frequency Provider Last Rate Last Dose  . umeclidinium-vilanterol (ANORO ELLIPTA) 62.5-25 MCG/INH 1 puff  1 puff Inhalation Daily Simonne Maffucci B, MD         Per HPI unless specifically indicated in ROS section below Review of Systems  Constitutional: Negative for activity change, appetite change, chills, fatigue, fever and unexpected weight change.  HENT: Negative for hearing loss.   Eyes: Negative for visual disturbance.  Respiratory: Positive for shortness of breath (with exertion). Negative for cough, chest tightness and  wheezing.   Cardiovascular: Negative for chest pain, palpitations and leg swelling.  Gastrointestinal: Negative for abdominal distention, abdominal pain, blood in stool, constipation, diarrhea, nausea and vomiting.  Genitourinary: Negative for difficulty urinating and hematuria.  Musculoskeletal: Negative for arthralgias, myalgias and neck pain.  Skin: Negative for rash.  Neurological: Negative for dizziness, seizures, syncope and headaches.  Hematological: Negative for adenopathy. Bruises/bleeds easily.  Psychiatric/Behavioral: Negative for dysphoric mood. The patient is not nervous/anxious.        Objective:    BP 122/80 (BP Location: Left Arm, Patient Position: Sitting, Cuff Size: Normal)   Pulse 61   Temp 98 F (36.7 C) (Oral)   Ht 5\' 10"  (1.778 m)   Wt 203 lb 8 oz (92.3 kg)   SpO2 96%   BMI 29.20 kg/m   Wt Readings from Last 3 Encounters:  06/30/17 203 lb 8 oz (92.3 kg)  06/29/17 204 lb 12 oz (92.9 kg)  05/11/17 202 lb 1.9 oz (91.7 kg)    Physical Exam  Constitutional: He is oriented to person, place, and time. He appears well-developed and well-nourished. No distress.  HENT:  Head: Normocephalic and atraumatic.  Right Ear: Hearing, tympanic membrane, external ear and ear canal normal.  Left Ear: Hearing, tympanic membrane, external ear and ear canal normal.  Nose: Nose normal.  Mouth/Throat: Uvula is midline, oropharynx is clear and moist and mucous membranes are normal. No oropharyngeal exudate, posterior oropharyngeal edema or posterior oropharyngeal erythema.  Eyes: Conjunctivae and EOM are normal. Pupils are equal, round, and reactive to light. No scleral icterus.  Neck: Normal range of motion. Neck supple. Carotid bruit is not present. No thyromegaly present.  Cardiovascular: Normal rate, regular rhythm, normal heart sounds and intact distal pulses.  No murmur heard. Pulses:      Radial pulses are 2+ on the right side, and 2+ on the left side.  Pulmonary/Chest:  Effort normal and breath sounds normal. No respiratory distress. He has no wheezes. He has no rales.  Abdominal: Soft. Bowel sounds are normal. He exhibits no distension and no mass. There is no tenderness. There is no rebound and no guarding.  Genitourinary: Rectum normal and prostate normal. Rectal exam shows no external hemorrhoid, no fissure, no mass, no tenderness and anal tone normal. Prostate is not enlarged (20gm) and not tender.  Musculoskeletal: Normal range of motion. He exhibits no edema.  Lymphadenopathy:    He has no cervical adenopathy.  Neurological: He is alert and oriented to person, place, and time.  CN grossly intact, station and gait intact  Skin: Skin is warm and dry. No rash noted.  Psychiatric: He has a normal mood and affect. His behavior is normal. Judgment and thought content normal.  Nursing note and vitals reviewed.  Results for orders placed or performed in visit on 06/29/17  Microalbumin/Creatinine Ratio, Urine  Result Value Ref Range   Microalb, Ur <0.7 0.0 - 1.9 mg/dL   Creatinine,U 70.6 mg/dL   Microalb Creat Ratio 1.0  0.0 - 30.0 mg/g  Hemoglobin A1c  Result Value Ref Range   Hgb A1c MFr Bld 6.4 4.6 - 6.5 %  Lipid Panel  Result Value Ref Range   Cholesterol 103 0 - 200 mg/dL   Triglycerides 63.0 0.0 - 149.0 mg/dL   HDL 43.20 >39.00 mg/dL   VLDL 12.6 0.0 - 40.0 mg/dL   LDL Cholesterol 47 0 - 99 mg/dL   Total CHOL/HDL Ratio 2    NonHDL 59.55   PSA, Medicare  Result Value Ref Range   PSA 0.93 0.10 - 4.00 ng/ml  CBC with Differential/Platelet  Result Value Ref Range   WBC 7.7 4.0 - 10.5 K/uL   RBC 4.99 4.22 - 5.81 Mil/uL   Hemoglobin 15.9 13.0 - 17.0 g/dL   HCT 47.3 39.0 - 52.0 %   MCV 94.9 78.0 - 100.0 fl   MCHC 33.7 30.0 - 36.0 g/dL   RDW 14.1 11.5 - 15.5 %   Platelets 248.0 150.0 - 400.0 K/uL   Neutrophils Relative % 59.3 43.0 - 77.0 %   Lymphocytes Relative 26.4 12.0 - 46.0 %   Monocytes Relative 9.1 3.0 - 12.0 %   Eosinophils Relative  4.3 0.0 - 5.0 %   Basophils Relative 0.9 0.0 - 3.0 %   Neutro Abs 4.6 1.4 - 7.7 K/uL   Lymphs Abs 2.0 0.7 - 4.0 K/uL   Monocytes Absolute 0.7 0.1 - 1.0 K/uL   Eosinophils Absolute 0.3 0.0 - 0.7 K/uL   Basophils Absolute 0.1 0.0 - 0.1 K/uL      Assessment & Plan:   Problem List Items Addressed This Visit    Chronic fatigue   COPD, moderate (HCC)    Continue breo daily.       Dyslipidemia    Chronic, stable on lipitor.  The ASCVD Risk score Mikey Bussing DC Jr., et al., 2013) failed to calculate for the following reasons:   The valid total cholesterol range is 130 to 320 mg/dL       Ex-smoker    Remains abstinent.       Healthcare maintenance - Primary    Preventative protocols reviewed and updated unless pt declined. Discussed healthy diet and lifestyle.       History of bladder cancer    Followed yearly by urology.       Hypertension    Chronic, stable. Continue current regimen.       Prediabetes    Chronic, stable. Reviewed decreased simple carbs.       Urinary frequency    myrbetriq helpful but expensive.       Zenker's diverticulum    Upcoming GI eval next month (colonoscopy,EGD, esoph manometry)          Follow up plan: Return in about 6 months (around 12/29/2017) for follow up visit.  [name], MD

## 2017-06-30 NOTE — Assessment & Plan Note (Signed)
Chronic, stable. Reviewed decreased simple carbs.

## 2017-07-02 NOTE — Progress Notes (Signed)
I reviewed health advisor's note, was available for consultation, and agree with documentation and plan.  

## 2017-07-12 DIAGNOSIS — H269 Unspecified cataract: Secondary | ICD-10-CM

## 2017-07-12 HISTORY — DX: Unspecified cataract: H26.9

## 2017-07-27 ENCOUNTER — Encounter (HOSPITAL_COMMUNITY): Payer: Self-pay | Admitting: Emergency Medicine

## 2017-07-27 ENCOUNTER — Other Ambulatory Visit: Payer: Self-pay

## 2017-07-28 DIAGNOSIS — G4733 Obstructive sleep apnea (adult) (pediatric): Secondary | ICD-10-CM | POA: Diagnosis not present

## 2017-08-02 ENCOUNTER — Encounter: Payer: Self-pay | Admitting: Family Medicine

## 2017-08-02 ENCOUNTER — Ambulatory Visit (INDEPENDENT_AMBULATORY_CARE_PROVIDER_SITE_OTHER): Payer: PPO | Admitting: Family Medicine

## 2017-08-02 ENCOUNTER — Other Ambulatory Visit: Payer: Self-pay

## 2017-08-02 VITALS — BP 104/60 | HR 71 | Temp 98.1°F | Ht 70.0 in | Wt 208.5 lb

## 2017-08-02 DIAGNOSIS — K225 Diverticulum of esophagus, acquired: Secondary | ICD-10-CM | POA: Diagnosis not present

## 2017-08-02 DIAGNOSIS — J029 Acute pharyngitis, unspecified: Secondary | ICD-10-CM | POA: Diagnosis not present

## 2017-08-02 LAB — POCT RAPID STREP A (OFFICE): Rapid Strep A Screen: NEGATIVE

## 2017-08-02 MED ORDER — AMOXICILLIN 500 MG PO CAPS: 1000.0000 mg | ORAL_CAPSULE | Freq: Two times a day (BID) | ORAL | 0 refills | Status: DC

## 2017-08-02 NOTE — Progress Notes (Signed)
   Subjective:    Patient ID: Carl Lee, male    DOB: 1955/09/01, 62 y.o.   MRN: [id number]  HPI   62 year old male former smoker with COPD and zenker's diverticulum pt  of Dr. Synthia Innocent presents with new onset  sore throat,  mild diarrhea in last 3 days  Yesterday progressed to increase soreness in right throat.  Scratchy throat.  Could not sleep with pain.  Chills. Feels tired.  Headache.  He does have chronic mild sore throat and dysphagia with his zenker's diverticulum.. But not pain like this.   No cough , no congestion. Has some post nasal drip... wrose than usual right ear pain, mild, occ sharp.   Has not taken anything for symptoms.  Sick contacts: none  He has endoscopy and colonoscopy upcoming next week.  Blood pressure 104/60, pulse 71, temperature 98.1 F (36.7 C), temperature source Oral, height 5\' 10"  (1.778 m), weight 208 lb 8 oz (94.6 kg), SpO2 94 %.  Review of Systems  Constitutional: Positive for chills and fatigue. Negative for fever.  HENT: Negative for ear pain.   Eyes: Negative for pain.  Respiratory: Negative for cough and shortness of breath.   Cardiovascular: Negative for chest pain, palpitations and leg swelling.  Gastrointestinal: Negative for abdominal pain.  Genitourinary: Negative for dysuria.  Musculoskeletal: Negative for arthralgias.  Neurological: Negative for syncope, light-headedness and headaches.  Psychiatric/Behavioral: Negative for dysphoric mood.       Objective:   Physical Exam  Constitutional: Vital signs are normal. He appears well-developed and well-nourished.  HENT:  Head: Normocephalic.  Right Ear: Hearing normal. No middle ear effusion.  Left Ear: Hearing normal.  No middle ear effusion.  Nose: No mucosal edema or rhinorrhea. Right sinus exhibits maxillary sinus tenderness.  Mouth/Throat: Mucous membranes are normal. No uvula swelling. Posterior oropharyngeal erythema present. No oropharyngeal exudate, posterior  oropharyngeal edema or tonsillar abscesses.    Mild lymphadenopathy anteriorly... Most ttp over right anterior neck midway.. No associated lymph node or swelling  Neck: Trachea normal. Carotid bruit is not present. No thyroid mass and no thyromegaly present.  Cardiovascular: Normal rate, regular rhythm and normal pulses. Exam reveals no gallop, no distant heart sounds and no friction rub.  No murmur heard. No peripheral edema  Pulmonary/Chest: Effort normal and breath sounds normal. No respiratory distress.  Skin: Skin is warm, dry and intact. No rash noted.  Psychiatric: He has a normal mood and affect. His speech is normal and behavior is normal. Thought content normal.          Assessment & Plan:

## 2017-08-02 NOTE — Patient Instructions (Signed)
Complete course of antibiotics x 10 days.  Call here or GI if not improving as expected.

## 2017-08-02 NOTE — Assessment & Plan Note (Signed)
Atypical for viral infection. ? Relationship with Zenker's diverticulum.  Strep negative.  Will treat with antibiotics to cover for bacterial infection given chills,unilateral severe sore throat.

## 2017-08-03 ENCOUNTER — Ambulatory Visit (AMBULATORY_SURGERY_CENTER): Payer: Self-pay

## 2017-08-03 VITALS — Ht 70.0 in | Wt 210.2 lb

## 2017-08-03 DIAGNOSIS — R1319 Other dysphagia: Secondary | ICD-10-CM

## 2017-08-03 DIAGNOSIS — K225 Diverticulum of esophagus, acquired: Secondary | ICD-10-CM

## 2017-08-03 DIAGNOSIS — Z86010 Personal history of colonic polyps: Secondary | ICD-10-CM

## 2017-08-03 MED ORDER — PEG 3350-KCL-NA BICARB-NACL 420 G PO SOLR: 4000.0000 mL | Freq: Once | ORAL | 0 refills | Status: AC

## 2017-08-03 NOTE — Progress Notes (Signed)
Per pt, no allergies to soy or egg products.Pt not taking any weight loss meds or using  O2 at home.  Pt refused emmi video. 

## 2017-08-04 ENCOUNTER — Telehealth: Payer: Self-pay | Admitting: Family Medicine

## 2017-08-04 MED ORDER — GABAPENTIN 300 MG PO CAPS: 300.0000 mg | ORAL_CAPSULE | Freq: Two times a day (BID) | ORAL | 3 refills | Status: DC

## 2017-08-04 NOTE — Telephone Encounter (Signed)
Spoke to pt who states he is needing to restart gabapentin. He states he has trigeminal neuralgia and the top of his head, neck and jaw have began to hurt. I advised the pt that he may need an office visit since his last episode and Rx were in 2015. Pt requesting a call back for advice

## 2017-08-04 NOTE — Telephone Encounter (Signed)
Spoke with pt relaying instructions and message per Dr. Darnell Level.  Says ok.  However, pt states he is scheduled for colonoscopy/endoscopy on Wed, 1/30.  Asking with taking gabapentin affect the procedure.

## 2017-08-04 NOTE — Telephone Encounter (Signed)
Copied from [place] [id number]. Topic: Quick Communication - See Telephone Encounter >> Aug 04, 2017  8:13 AM Robina Ade, Helene Kelp D wrote: CRM for notification. See Telephone encounter for: 08/04/17. Patient called and said he would like to talk to Dr. Danise Mina or his CMA about getting a refill on his gabapentin. He has not need to take them since 2015, therefore that is the reason he would like to talk to him about it. Please call patient back, thanks.

## 2017-08-04 NOTE — Telephone Encounter (Signed)
Ok to start - but take only 1 tab at night until after colonoscopy.

## 2017-08-04 NOTE — Telephone Encounter (Signed)
Ok to restart. Take gabapentin 300mg  nightly for 3 days then increase to BID and update [place] with effect, schedule OV if not doing better.

## 2017-08-05 NOTE — Telephone Encounter (Signed)
Spoke with pt relaying instructions per Dr. Darnell Level.  Pt says ok.

## 2017-08-08 ENCOUNTER — Telehealth: Payer: Self-pay | Admitting: Gastroenterology

## 2017-08-08 DIAGNOSIS — L2089 Other atopic dermatitis: Secondary | ICD-10-CM | POA: Diagnosis not present

## 2017-08-08 DIAGNOSIS — D2371 Other benign neoplasm of skin of right lower limb, including hip: Secondary | ICD-10-CM | POA: Diagnosis not present

## 2017-08-08 DIAGNOSIS — L821 Other seborrheic keratosis: Secondary | ICD-10-CM | POA: Diagnosis not present

## 2017-08-08 DIAGNOSIS — L570 Actinic keratosis: Secondary | ICD-10-CM | POA: Diagnosis not present

## 2017-08-08 DIAGNOSIS — X32XXXA Exposure to sunlight, initial encounter: Secondary | ICD-10-CM | POA: Diagnosis not present

## 2017-08-08 NOTE — Telephone Encounter (Signed)
The pt was advised that he will be fine to continue with the procedure as planned.  His medication should not have any adverse reactions with the procedure.

## 2017-08-09 NOTE — Anesthesia Preprocedure Evaluation (Signed)
Anesthesia Evaluation  Patient identified by MRN, date of birth, ID band Patient awake    Reviewed: Allergy & Precautions, H&P , NPO status , Patient's Chart, lab work & pertinent test results, reviewed documented beta blocker date and time   History of Anesthesia Complications (+) history of anesthetic complications  Airway Mallampati: III  TM Distance: >3 FB Neck ROM: Full    Dental no notable dental hx. (+) Teeth Intact, Dental Advisory Given   Pulmonary neg pulmonary ROS, asthma , sleep apnea , COPD, former smoker,    Pulmonary exam normal breath sounds clear to auscultation       Cardiovascular hypertension, On Medications + CAD, + CABG and + Peripheral Vascular Disease   Rhythm:Regular Rate:Normal     Neuro/Psych  Headaches,  Neuromuscular disease negative psych ROS   GI/Hepatic negative GI ROS, Neg liver ROS,   Endo/Other  negative endocrine ROS  Renal/GU Renal diseasenegative Renal ROS  negative genitourinary   Musculoskeletal  (+) Arthritis , Osteoarthritis,    Abdominal   Peds  Hematology negative hematology ROS (+)   Anesthesia Other Findings CATH 8/18 Mild LV dysfunction with an ejection fraction of 45-50% with mild mid to basal inferior hypocontractility and a nipple appearing focal apical aneurysm.  Significant native CAD with total occlusion of the proximal LAD after a prominent septal perforating artery; 55% stenosis prior to an ectatic aneurysmal segment in the proximal circumflex 20% mild narrowing between the OM1 and OM 2 with retrograde filling into the OM 2 via the RIMA graft; and total occlusion of the proximal RCA with bridging collaterals to the mid RCA and collaterals arising from the distal circumflex and septal perforating artery of the LAD supplying the very distal branch vessels.  Patent LIMA graft supplying the mid LAD.  Patent RIMA graft supplying the OM 2 vessel of the  circumflex  Old occluded vein graft, which had supplied the PDA region.  RECOMMENDATION: Medical therapy.  The patient will return to the care of Dr. Roosvelt Harps in [place].  Based on his anatomy, which essentially is unchanged from 2 years ago,  cardiac clearance can be given for his hip surgery.   Reproductive/Obstetrics negative OB ROS                             Anesthesia Physical  Anesthesia Plan  ASA: III  Anesthesia Plan: MAC   Post-op Pain Management:    Induction:   PONV Risk Score and Plan: 2 and Ondansetron and Propofol infusion  Airway Management Planned: Nasal Cannula and Natural Airway  Additional Equipment:   Intra-op Plan:   Post-operative Plan:   Informed Consent: I have reviewed the patients History and Physical, chart, labs and discussed the procedure including the risks, benefits and alternatives for the proposed anesthesia with the patient or authorized representative who has indicated his/her understanding and acceptance.   Dental advisory given  Plan Discussed with: CRNA  Anesthesia Plan Comments:         Anesthesia Quick Evaluation

## 2017-08-10 ENCOUNTER — Other Ambulatory Visit: Payer: Self-pay

## 2017-08-10 ENCOUNTER — Ambulatory Visit (HOSPITAL_COMMUNITY): Payer: PPO | Admitting: Anesthesiology

## 2017-08-10 ENCOUNTER — Encounter (HOSPITAL_COMMUNITY): Payer: Self-pay | Admitting: *Deleted

## 2017-08-10 ENCOUNTER — Encounter (HOSPITAL_COMMUNITY)
Admission: RE | Disposition: A | Discharge status: Home / Self Care | Payer: Self-pay | Source: Ambulatory Visit | Attending: Gastroenterology

## 2017-08-10 ENCOUNTER — Ambulatory Visit (HOSPITAL_COMMUNITY)
Admission: RE | Admit: 2017-08-10 | Discharge: 2017-08-10 | Disposition: A | Discharge status: Home / Self Care | Payer: PPO | Source: Ambulatory Visit | Attending: Gastroenterology | Admitting: Gastroenterology

## 2017-08-10 DIAGNOSIS — G4733 Obstructive sleep apnea (adult) (pediatric): Secondary | ICD-10-CM | POA: Insufficient documentation

## 2017-08-10 DIAGNOSIS — I2510 Atherosclerotic heart disease of native coronary artery without angina pectoris: Secondary | ICD-10-CM | POA: Diagnosis not present

## 2017-08-10 DIAGNOSIS — Z96642 Presence of left artificial hip joint: Secondary | ICD-10-CM | POA: Insufficient documentation

## 2017-08-10 DIAGNOSIS — Z951 Presence of aortocoronary bypass graft: Secondary | ICD-10-CM | POA: Diagnosis not present

## 2017-08-10 DIAGNOSIS — Z87891 Personal history of nicotine dependence: Secondary | ICD-10-CM | POA: Insufficient documentation

## 2017-08-10 DIAGNOSIS — I10 Essential (primary) hypertension: Secondary | ICD-10-CM | POA: Diagnosis not present

## 2017-08-10 DIAGNOSIS — E538 Deficiency of other specified B group vitamins: Secondary | ICD-10-CM | POA: Diagnosis not present

## 2017-08-10 DIAGNOSIS — J449 Chronic obstructive pulmonary disease, unspecified: Secondary | ICD-10-CM | POA: Diagnosis not present

## 2017-08-10 DIAGNOSIS — Z86010 Personal history of colonic polyps: Secondary | ICD-10-CM | POA: Insufficient documentation

## 2017-08-10 DIAGNOSIS — Z881 Allergy status to other antibiotic agents status: Secondary | ICD-10-CM | POA: Insufficient documentation

## 2017-08-10 DIAGNOSIS — Z8249 Family history of ischemic heart disease and other diseases of the circulatory system: Secondary | ICD-10-CM | POA: Diagnosis not present

## 2017-08-10 DIAGNOSIS — Z8551 Personal history of malignant neoplasm of bladder: Secondary | ICD-10-CM | POA: Insufficient documentation

## 2017-08-10 DIAGNOSIS — R1310 Dysphagia, unspecified: Secondary | ICD-10-CM | POA: Diagnosis not present

## 2017-08-10 DIAGNOSIS — K2970 Gastritis, unspecified, without bleeding: Secondary | ICD-10-CM | POA: Diagnosis not present

## 2017-08-10 DIAGNOSIS — K225 Diverticulum of esophagus, acquired: Secondary | ICD-10-CM | POA: Insufficient documentation

## 2017-08-10 DIAGNOSIS — I714 Abdominal aortic aneurysm, without rupture: Secondary | ICD-10-CM | POA: Diagnosis not present

## 2017-08-10 DIAGNOSIS — D124 Benign neoplasm of descending colon: Secondary | ICD-10-CM | POA: Diagnosis not present

## 2017-08-10 DIAGNOSIS — Z1211 Encounter for screening for malignant neoplasm of colon: Secondary | ICD-10-CM | POA: Diagnosis not present

## 2017-08-10 DIAGNOSIS — R7303 Prediabetes: Secondary | ICD-10-CM | POA: Insufficient documentation

## 2017-08-10 DIAGNOSIS — Z9889 Other specified postprocedural states: Secondary | ICD-10-CM | POA: Insufficient documentation

## 2017-08-10 DIAGNOSIS — Z885 Allergy status to narcotic agent status: Secondary | ICD-10-CM | POA: Diagnosis not present

## 2017-08-10 DIAGNOSIS — D123 Benign neoplasm of transverse colon: Secondary | ICD-10-CM

## 2017-08-10 DIAGNOSIS — I739 Peripheral vascular disease, unspecified: Secondary | ICD-10-CM | POA: Diagnosis not present

## 2017-08-10 DIAGNOSIS — Z9989 Dependence on other enabling machines and devices: Secondary | ICD-10-CM | POA: Insufficient documentation

## 2017-08-10 DIAGNOSIS — K5730 Diverticulosis of large intestine without perforation or abscess without bleeding: Secondary | ICD-10-CM

## 2017-08-10 HISTORY — PX: ESOPHAGEAL MANOMETRY: SHX5429

## 2017-08-10 HISTORY — PX: ESOPHAGOGASTRODUODENOSCOPY (EGD) WITH PROPOFOL: SHX5813

## 2017-08-10 HISTORY — PX: COLONOSCOPY WITH PROPOFOL: SHX5780

## 2017-08-10 SURGERY — COLONOSCOPY WITH PROPOFOL
Anesthesia: Monitor Anesthesia Care

## 2017-08-10 SURGERY — MANOMETRY, ESOPHAGUS
Anesthesia: LOCAL

## 2017-08-10 MED ORDER — LIDOCAINE 2% (20 MG/ML) 5 ML SYRINGE
INTRAMUSCULAR | Status: DC | PRN
  Administered 2017-08-10: 100 mg via INTRAVENOUS

## 2017-08-10 MED ORDER — LACTATED RINGERS IV SOLN
INTRAVENOUS | Status: DC
  Administered 2017-08-10: 08:00:00 via INTRAVENOUS

## 2017-08-10 MED ORDER — PROPOFOL 10 MG/ML IV BOLUS
INTRAVENOUS | Status: AC
  Filled 2017-08-10: qty 60

## 2017-08-10 MED ORDER — ONDANSETRON HCL 4 MG/2ML IJ SOLN
INTRAMUSCULAR | Status: DC | PRN
  Administered 2017-08-10: 4 mg via INTRAVENOUS

## 2017-08-10 MED ORDER — PROPOFOL 10 MG/ML IV BOLUS
INTRAVENOUS | Status: DC | PRN
  Administered 2017-08-10: 30 mg via INTRAVENOUS
  Administered 2017-08-10: 20 mg via INTRAVENOUS

## 2017-08-10 MED ORDER — PROPOFOL 500 MG/50ML IV EMUL
INTRAVENOUS | Status: DC | PRN
  Administered 2017-08-10: 125 ug/kg/min via INTRAVENOUS

## 2017-08-10 SURGICAL SUPPLY — 24 items

## 2017-08-10 SURGICAL SUPPLY — 2 items
FACESHIELD LNG OPTICON STERILE (SAFETY) IMPLANT
GLOVE BIO SURGEON STRL SZ8 (GLOVE) ×4 IMPLANT

## 2017-08-10 NOTE — Op Note (Signed)
[facility] Patient Name: Carl Lee Procedure Date: 08/10/2017 MRN: [id number] Attending MD: [name] , MD Date of Birth: 1956-01-18 CSN: [id number] Age: 62 Admit Type: Outpatient Procedure:                Upper GI endoscopy Indications:              Dysphagia; assist placement of manometry probe                            given known Zenker's Diverticulum Providers:                [name], MD, Cleda Daub, RN, William Dalton, Technician, Laurena Spies, Technician Referring MD:              Medicines:                Monitored Anesthesia Care Complications:            No immediate complications. Estimated blood loss:                            Minimal. Estimated Blood Loss:     Estimated blood loss: none. Procedure:                Pre-Anesthesia Assessment:                           - Prior to the procedure, a History and Physical                            was performed, and patient medications and                            allergies were reviewed. The patient's tolerance of                            previous anesthesia was also reviewed. The risks                            and benefits of the procedure and the sedation                            options and risks were discussed with the patient.                            All questions were answered, and informed consent                            was obtained. Prior Anticoagulants: The patient has                            taken no previous anticoagulant or antiplatelet  agents. ASA Grade Assessment: II - A patient with                            mild systemic disease. After reviewing the risks                            and benefits, the patient was deemed in                            satisfactory condition to undergo the procedure.                           After obtaining informed consent, the endoscope was   passed under direct vision. Throughout the                            procedure, the patient's blood pressure, pulse, and                            oxygen saturations were monitored continuously. The                            [id number] ([id number]) scope was introduced through the                            mouth, and advanced to the second part of duodenum.                            The upper GI endoscopy was accomplished without                            difficulty. The patient tolerated the procedure                            well. Scope In: Scope Out: Findings:      A non-bleeding Zenker's diverticulum with a small opening, no impacted       food and no stigmata of recent bleeding was found.      The esophagus was otherwise normal.      Mild non-specific gastritis.      The examination was otherwise normal.      Following inpection, the esophageal manometry probe was place through       his right nare and advanced deeply into this esophagus, stomach. I used       direct visualization with the gastroscope to ensure the probe did not       lodge within the Zenker's diverticulum during placement. Impression:               - Zenker's Diverticulum                           - Mild gastritis. Moderate Sedation:      N/A- Per Anesthesia Care Recommendation:           - Patient has a contact number available for  emergencies. The signs and symptoms of potential                            delayed complications were discussed with the                            patient. Return to normal activities tomorrow.                            Written discharge instructions were provided to the                            patient.                           - Resume previous diet.                           - Continue present medications.                           - Proceed with Hi Resolution Manometry today as                            workup for Zenker's therapy. Procedure  Code(s):        --- Professional ---                           [phone], Esophagogastroduodenoscopy, flexible,                            transoral; diagnostic, including collection of                            specimen(s) by brushing or washing, when performed                            (separate procedure) Diagnosis Code(s):        --- Professional ---                           K22.5, Diverticulum of esophagus, acquired                           R13.10, Dysphagia, unspecified CPT copyright 2016 American Medical Association. All rights reserved. The codes documented in this report are preliminary and upon coder review may  be revised to meet current compliance requirements. [name], MD 08/10/2017 9:02:44 AM This report has been signed electronically. Number of Addenda: 0

## 2017-08-10 NOTE — Anesthesia Postprocedure Evaluation (Signed)
Anesthesia Post Note  Patient: Carl Lee  Procedure(s) Performed: COLONOSCOPY WITH PROPOFOL (N/A ) ESOPHAGOGASTRODUODENOSCOPY (EGD) WITH PROPOFOL (N/A )     Patient location during evaluation: PACU Anesthesia Type: MAC Level of consciousness: awake and alert Pain management: pain level controlled Vital Signs Assessment: post-procedure vital signs reviewed and stable Respiratory status: spontaneous breathing Cardiovascular status: stable Anesthetic complications: no    Last Vitals:  Vitals:   08/10/17 0920 08/10/17 0930  BP: 112/69   Pulse: (!) 57 64  Resp: 17 (!) 24  Temp:    SpO2: 94% 95%    Last Pain:  Vitals:   08/10/17 0906  TempSrc: Oral  PainSc: [place]

## 2017-08-10 NOTE — H&P (Signed)
HPI: This is a 62 yo man  Chief complaint is for polyp surveillance colonoscopy as well as HR esophageal mano as workup for zenkers therapy (probe to be placed endoscopically)  ROS: complete GI ROS as described in HPI, all other review negative.  Constitutional:  No unintentional weight loss   Past Medical History:  Diagnosis Date  . AAA (abdominal aortic aneurysm) ([place]) 03/25/12   s/p endovascular repair  . Adenomatous polyp 06/2009  . Atrial tachycardia, paroxysmal ([place]) 06/18/2016  . Bladder cancer ([place]) 08/16/2008   recurrence 2015 and 2018 treated with office fulguration  . CAD (coronary artery disease), native coronary artery    S/p 3v CABG 01/2014   . Childhood asthma    as a child  . Complication of anesthesia    "takes me a year to get over"  . Contact dermatitis    atypical [name])  . COPD (chronic obstructive pulmonary disease) ([place])   . Coronary artery disease    a.2015: CABG w/ LIMA-LAD, RIMA-OM, SVG-RCA b. Cath on 07/10/15 w/ patent LIMA-LAD and RIMA-OM. SVG-RCA occluded but collaterals present  . Diverticulosis 2013   severe by CT and colonoscopy  . Environmental allergies    improved as ages  . Fracture of lateral malleolus of left ankle 08/29/2013  . Headache    hasn't had migraines in years  . Hepatic steatosis 06/2015   by [place]  . Hx of migraines   . Hypertension   . Lumbosacral radiculopathy at S1 03/2012   left, with spinal stenosis (MRI 04/2013) improved with TF ESI L5/S1 and S1/2 ([place])  . OSA (obstructive sleep apnea) 09/04/2014   Severe with AHI 35/hr, uses BIPAP  . Overweight (BMI 25.0-29.9) 01/26/2017  . Pre-diabetes   . Prediabetes 06/08/2014   CAD   . Resistance to clopidogrel 2015   drug metabolism panel run - asked to scan  . Spinal stenosis    LS1 nerve root impingement from bulging disc  . Vitamin B12 deficiency   . Zenker diverticulum     Past Surgical History:  Procedure Laterality Date  . ABDOMINAL AORTIC ANEURYSM  REPAIR  03/25/12   endovascular  . ABI  05/2013   WNL, L TBI low at 0.66  . Bladder cancer  March 2010 and Oct. 2015   Ernst Spell) x 2  . CARDIAC CATHETERIZATION N/A 07/10/2015   Procedure: Left Heart Cath and Cors/Grafts Angiography;  Surgeon: [name], MD;  Location: [facility];  Service: Cardiovascular;  Laterality: N/A;  . COLONOSCOPY  06/2012   hyperplastic polyp, diverticulosis ([place]) rec rpt 5 yrs  . CORONARY ARTERY BYPASS GRAFT N/A 02/07/2014   Procedure: CORONARY ARTERY BYPASS GRAFTING (CABG);  Surgeon: [name], MD;  Location: [facility];  Service: Open Heart Surgery;  Laterality: N/A;  CABG X 3, BILATERAL LIMA, EVH  . CYSTOSCOPY  08/16/08   Bladder Cancer  . EPIDURAL BLOCK INJECTION Left 09/2014, 10/2014, 12/2014   medial L2,3,4, dorsal L5 ramus blocks x2, L L5/S1 and S1/2 transforaminal ESI ([place])  . ESI  04/2013, 06/2013   L L5S1, S12 transforaminal ESI (Dr.  [name])  . ESI Left 04/2014, 05/2014, 06/2014   L5/S1, S1/2; rpt; L4/5  . ESI  03/2016   R L5/S1 interlaminar ESI  . FULGURATION OF BLADDER TUMOR  01/2017   recurrent 54mm L lateral wall papillary transitional cell carcinoma ([place])  . INTRAOPERATIVE TRANSESOPHAGEAL ECHOCARDIOGRAM N/A 02/07/2014   Procedure: INTRAOPERATIVE TRANSESOPHAGEAL ECHOCARDIOGRAM;  Surgeon: [name],  MD;  Location: [facility];  Service: Open Heart Surgery;  Laterality: N/A;  . LEFT HEART CATH AND CORS/GRAFTS ANGIOGRAPHY N/A 02/11/2017   Procedure: LEFT HEART CATH AND CORS/GRAFTS ANGIOGRAPHY;  Surgeon: [name], MD;  Location: [place] [facility];  Service: Cardiovascular;  Laterality: N/A;  . LEFT HEART CATHETERIZATION WITH CORONARY ANGIOGRAM N/A 02/01/2014   Procedure: LEFT HEART CATHETERIZATION WITH CORONARY ANGIOGRAM;  Surgeon: [name], MD;  Location: [facility];  Service: Cardiovascular;  Laterality: N/A;  . PRP epidural injection Left 11/2015   L5/S1, S1/2 transforaminal  epidural PRP injections under fluoroscopy ([place])  . TONSILLECTOMY AND ADENOIDECTOMY  1973  . TOTAL HIP ARTHROPLASTY Left 03/08/2017  . TOTAL HIP ARTHROPLASTY Left 03/08/2017   Procedure: LEFT TOTAL HIP ARTHROPLASTY ANTERIOR APPROACH;  Surgeon: [name], MD;  Location: [facility];  Service: Orthopedics;  Laterality: Left;  [name] VASECTOMY      Current Facility-Administered Medications  Medication Dose Route Frequency Provider Last Rate Last Dose  . lactated ringers infusion   Intravenous Continuous [name], MD 20 mL/hr at 08/10/17 9767      Allergies as of 06/13/2017 - Review Complete 05/26/2017  Allergen Reaction Noted  . Codeine Other (See Comments) 03/24/2012  . Doxycycline Hives 06/24/2015    Family History  Problem Relation Age of Onset  . Diabetes Mother   . Arrhythmia Mother        pacemaker  . Cancer Mother        Bladder  . COPD Mother   . Thyroid disease Mother   . Hyperlipidemia Mother   . Hypertension Mother   . Diabetes Father   . CAD Father 71       CHF, MI  . Dementia Father   . Heart attack Father   . Diabetes Sister   . Heart attack Maternal Grandmother   . AAA (abdominal aortic aneurysm) Maternal Grandmother   . Colon cancer Neg Hx     Social History   Socioeconomic History  . Marital status: Married    Spouse name: Not on file  . Number of children: 2  . Years of education: Not on file  . Highest education level: Not on file  Social Needs  . Financial resource strain: Not on file  . Food insecurity - worry: Not on file  . Food insecurity - inability: Not on file  . Transportation needs - medical: Not on file  . Transportation needs - non-medical: Not on file  Occupational History  . Occupation: Self Employed  Tobacco Use  . Smoking status: Former Smoker    Packs/day: 1.00    Years: 40.00    Pack years: 40.00    Types: Cigarettes    Last attempt to quit: 02/05/2014    Years since quitting: 3.5  . Smokeless  tobacco: Never Used  Substance and Sexual Activity  . Alcohol use: Yes    Alcohol/week: 0.0 oz    Comment: Rare- states 1 oz. per week  . Drug use: No  . Sexual activity: Not on file  Other Topics Concern  . Not on file  Social History Narrative   Caffeine: 1 cup coffee/day   Lives with wife, 1 dog   grown children   Occupation: Therapist, sports - self employed.  Disability after CABG   Edu: 2 yrs college   Activity: fishing, walking occasionally   Diet: good water, fruits/vegetables daily     Physical Exam: BP 124/69   Pulse 61  Temp 97.9 F (36.6 C) (Oral)   Resp (!) 21   Ht 5\' 10"  (1.778 m)   Wt 210 lb 3.2 oz (95.3 kg)   SpO2 96%   BMI 30.16 kg/m  Constitutional: generally well-appearing Psychiatric: alert and oriented x3 Abdomen: soft, nontender, nondistended, no obvious ascites, no peritoneal signs, normal bowel sounds No peripheral edema noted in lower extremities  Assessment and plan: 62 y.o. male with history of polyps, zenkers, dysphagia  For colonoscoyp and also endoscopic placement of HRM catheter as workup for zenkers therapy.  Please see the "Patient Instructions" section for addition details about the plan.  Owens Loffler, MD [facility] 08/10/2017, 8:10 AM

## 2017-08-10 NOTE — Transfer of Care (Signed)
Immediate Anesthesia Transfer of Care Note  Patient: Carl Lee  Procedure(s) Performed: COLONOSCOPY WITH PROPOFOL (N/A ) ESOPHAGOGASTRODUODENOSCOPY (EGD) WITH PROPOFOL (N/A )  Patient Location: PACU  Anesthesia Type:MAC  Level of Consciousness: sedated  Airway & Oxygen Therapy: Patient Spontanous Breathing and Patient connected to nasal cannula oxygen  Post-op Assessment: Report given to RN and Post -op Vital signs reviewed and stable  Post vital signs: Reviewed and stable  Last Vitals:  Vitals:   08/10/17 0725  BP: 124/69  Pulse: 61  Resp: (!) 21  Temp: 36.6 C  SpO2: 96%    Last Pain:  Vitals:   08/10/17 0725  TempSrc: Oral  PainSc: 7          Complications: No apparent anesthesia complications

## 2017-08-10 NOTE — Discharge Instructions (Signed)

## 2017-08-10 NOTE — Progress Notes (Signed)
Esophageal manometry catheter placed by Dr Oretha Caprice.  Patient recovered in endoscopy.  When patient was awake he was moved to manometry room and catheter was adjusted to corrected position.  Esophageal manometry was then done per protocol.  Patient tolerated well.  Report will be sent to Dr Silverio Decamp today.

## 2017-08-10 NOTE — Op Note (Signed)
[facility] Patient Name: Carl Lee Procedure Date: 08/10/2017 MRN: [id number] Attending MD: [name] , MD Date of Birth: 15-Oct-1955 CSN: [id number] Age: 62 Admit Type: Outpatient Procedure:                Colonoscopy Indications:              High risk colon cancer surveillance: Personal                            history of colonic polyps; H/o adenomatous colon                            polyps: colonoscopy December 2013; one small polyp                            that was removed and was a hyperplastic polyp.                            Colonoscopy 06/2009 two adenomas, one was 48mm. Providers:                [name], MD, Cleda Daub, RN, William Dalton, Technician, Laurena Spies, Technician Referring MD:              Medicines:                Monitored Anesthesia Care Complications:            No immediate complications. Estimated blood loss:                            None. Estimated Blood Loss:     Estimated blood loss: none. Procedure:                Pre-Anesthesia Assessment:                           - Prior to the procedure, a History and Physical                            was performed, and patient medications and                            allergies were reviewed. The patient's tolerance of                            previous anesthesia was also reviewed. The risks                            and benefits of the procedure and the sedation                            options and risks were discussed with the patient.  All questions were answered, and informed consent                            was obtained. Prior Anticoagulants: The patient has                            taken no previous anticoagulant or antiplatelet                            agents. ASA Grade Assessment: II - A patient with                            mild systemic disease. After reviewing the risks             and benefits, the patient was deemed in                            satisfactory condition to undergo the procedure.                           After obtaining informed consent, the colonoscope                            was passed under direct vision. Throughout the                            procedure, the patient's blood pressure, pulse, and                            oxygen saturations were monitored continuously. The                            [id number] ([id number]) scope was introduced through                            the anus and advanced to the the terminal ileum.                            The colonoscopy was performed without difficulty.                            The patient tolerated the procedure well. The                            quality of the bowel preparation was good. The                            terminal ileum, ileocecal valve, appendiceal                            orifice, and rectum were photographed. Scope In: 8:29:00 AM Scope Out: 8:42:11 AM Scope Withdrawal Time: 0 hours 10 minutes 2 seconds  Total Procedure Duration: 0 hours 13 minutes 11 seconds  Findings:      Four sessile  polyps were found in the descending colon and transverse       colon. The polyps were 2 to 4 mm in size. These polyps were removed with       a cold snare. Resection and retrieval were complete.      Multiple small and large-mouthed diverticula were found in the entire       colon.      The exam was otherwise without abnormality on direct and retroflexion       views. Impression:               - Four 2 to 4 mm polyps in the descending colon and                            in the transverse colon, removed with a cold snare.                            Resected and retrieved.                           - Diverticulosis in the entire examined colon.                           - The examination was otherwise normal on direct                            and retroflexion views. Moderate  Sedation:      N/A- Per Anesthesia Care Recommendation:           - Patient has a contact number available for                            emergencies. The signs and symptoms of potential                            delayed complications were discussed with the                            patient. Return to normal activities tomorrow.                            Written discharge instructions were provided to the                            patient.                           - Resume previous diet.                           - Continue present medications.                           - Repeat colonoscopy is recommended. The                            colonoscopy date will be determined after pathology  results from today's exam become available for                            review.                           -                           You will receive a letter within 2-3 weeks with the                            pathology results and my final recommendations.                           If the polyp(s) is proven to be 'pre-cancerous' on                            pathology, you will need repeat colonoscopy in 3-5                            years. Procedure Code(s):        --- Professional ---                           [phone], Colonoscopy, flexible; with removal of                            tumor(s), polyp(s), or other lesion(s) by snare                            technique Diagnosis Code(s):        --- Professional ---                           Z86.010, Personal history of colonic polyps                           D12.4, Benign neoplasm of descending colon                           D12.3, Benign neoplasm of transverse colon (hepatic                            flexure or splenic flexure)                           K57.30, Diverticulosis of large intestine without                            perforation or abscess without bleeding CPT copyright 2016 American Medical  Association. All rights reserved. The codes documented in this report are preliminary and upon coder review may  be revised to meet current compliance requirements. [name], MD 08/10/2017 8:44:07 AM This report has been signed electronically. Number of Addenda: 0

## 2017-08-11 ENCOUNTER — Encounter (HOSPITAL_COMMUNITY): Payer: Self-pay | Admitting: Gastroenterology

## 2017-08-14 ENCOUNTER — Encounter: Payer: Self-pay | Admitting: Family Medicine

## 2017-08-15 ENCOUNTER — Other Ambulatory Visit: Payer: Self-pay

## 2017-08-15 DIAGNOSIS — K225 Diverticulum of esophagus, acquired: Secondary | ICD-10-CM

## 2017-08-23 DIAGNOSIS — K225 Diverticulum of esophagus, acquired: Secondary | ICD-10-CM | POA: Diagnosis not present

## 2017-08-28 ENCOUNTER — Other Ambulatory Visit: Payer: Self-pay | Admitting: Family Medicine

## 2017-08-28 DIAGNOSIS — G4733 Obstructive sleep apnea (adult) (pediatric): Secondary | ICD-10-CM | POA: Diagnosis not present

## 2017-08-29 ENCOUNTER — Telehealth: Payer: Self-pay | Admitting: Gastroenterology

## 2017-08-29 NOTE — Telephone Encounter (Signed)
The pt was advised to continue his medications as prescribed.  He will call if he has any problems or concerns

## 2017-09-05 ENCOUNTER — Encounter (INDEPENDENT_AMBULATORY_CARE_PROVIDER_SITE_OTHER): Payer: Self-pay | Admitting: Physician Assistant

## 2017-09-05 ENCOUNTER — Ambulatory Visit (INDEPENDENT_AMBULATORY_CARE_PROVIDER_SITE_OTHER): Payer: PPO | Admitting: Physician Assistant

## 2017-09-05 ENCOUNTER — Ambulatory Visit (INDEPENDENT_AMBULATORY_CARE_PROVIDER_SITE_OTHER): Payer: PPO

## 2017-09-05 VITALS — Ht 70.0 in | Wt 205.0 lb

## 2017-09-05 DIAGNOSIS — M25552 Pain in left hip: Secondary | ICD-10-CM

## 2017-09-05 DIAGNOSIS — Z96642 Presence of left artificial hip joint: Secondary | ICD-10-CM

## 2017-09-05 DIAGNOSIS — M7062 Trochanteric bursitis, left hip: Secondary | ICD-10-CM

## 2017-09-05 MED ORDER — LIDOCAINE HCL 1 % IJ SOLN
3.0000 mL | INTRAMUSCULAR | Status: AC | PRN
  Administered 2017-09-05: 3 mL

## 2017-09-05 MED ORDER — OXYCODONE-ACETAMINOPHEN 5-325 MG PO TABS: 1.0000 | ORAL_TABLET | Freq: Four times a day (QID) | ORAL | 0 refills | Status: DC | PRN

## 2017-09-05 MED ORDER — METHYLPREDNISOLONE ACETATE 40 MG/ML IJ SUSP
40.0000 mg | INTRAMUSCULAR | Status: AC | PRN
  Administered 2017-09-05: 40 mg via INTRA_ARTICULAR

## 2017-09-05 NOTE — Progress Notes (Signed)
Office Visit Note   Patient: Carl Lee           Date of Birth: 02-18-1956           MRN: [id number] Visit Date: 09/05/2017              Requested by: [name], MD [place], [place] [address] PCP: [name], MD   Assessment & Plan: Visit Diagnoses:  1. Pain in left hip   2. Status post total replacement of left hip   3. Trochanteric bursitis, left hip     Plan: We will have him do IT band stretching exercises as shown in the past.  He will follow-up in 2 weeks to check his progress lack of.  Questions encouraged and answered at length.  Discussed with him that some of the numbness about the hip should dissipate with time.  Follow-Up Instructions: Return in about 2 weeks (around 09/19/2017), or if symptoms worsen or fail to improve.   Orders:  Orders Placed This Encounter  Procedures  . Large Joint Inj  . XR HIP UNILAT W OR W/O PELVIS 2-3 VIEWS LEFT   Meds ordered this encounter  Medications  . oxyCODONE-acetaminophen (PERCOCET/ROXICET) 5-325 MG tablet    Sig: Take 1 tablet by mouth every 6 (six) hours as needed for severe pain.    Dispense:  30 tablet    Refill:  0      Procedures: Large Joint Inj: L greater trochanter on 09/05/2017 9:40 PM Indications: pain Details: 22 G 1.5 in needle, lateral approach  Arthrogram: No  Medications: 3 mL lidocaine 1 %; 40 mg methylPREDNISolone acetate 40 MG/ML Outcome: tolerated well, no immediate complications Procedure, treatment alternatives, risks and benefits explained, specific risks discussed. Consent was given by the patient. Immediately prior to procedure a time out was called to verify the correct patient, procedure, equipment, support staff and site/side marked as required. Patient was prepped and draped in the usual sterile fashion.       Clinical Data: No additional findings.   Subjective: Chief Complaint  Patient presents with  . Left Hip - Pain    HPI Carl Lee  returns today follow-up of his left hip is now 6 months status post left total hip arthroplasty.  There is a 7 burning feeling about the hip.  He is on gabapentin for another neuralgia and states that the 300 mg of Neurontin he takes daily does not help with the burning sensation.  He has pain whenever he lies on the left hip at night.  He is unable to sleep on that side.  States if he walks for a long time he has burning aching lateral aspect of the hip.  He denies any radicular symptoms down the leg. Review of Systems Denies fevers chills.  Otherwise please see HPI  Objective: Vital Signs: Ht 5\' 10"  (1.778 m)   Wt 205 lb (93 kg)   BMI 29.41 kg/m   Physical Exam Well-developed well-nourished male in no acute distress mood affect appropriate. Ortho Exam Left hip he has good range of motion of the hip without pain.  Surgical incisions well-healed.  No signs of infection.  Tenderness over the left trochanteric region. No specialty comments available.  Imaging: Xr Hip Unilat W Or W/o Pelvis 2-3 Views Left  Result Date: 09/05/2017 AP pelvis lateral view of the left hip: No acute fracture.  Left total hip is well seated.  No dislocation of either hip.  PMFS History: Patient Active Problem List   Diagnosis Date Noted  . History of colonic polyps   . Polyp of transverse colon   . Polyp of descending colon   . Diverticulosis of colon without hemorrhage   . Sore throat 08/02/2017  . Urinary frequency 06/30/2017  . Status post total replacement of left hip 03/08/2017  . Abnormal nuclear stress test   . Overweight (BMI 25.0-29.9) 01/26/2017  . Radiculopathy due to lumbar intervertebral disc disorder 10/21/2016  . Osteoarthritis of left hip 08/16/2016  . Atrial tachycardia, paroxysmal ([place]) 06/18/2016  . Chronic fatigue 04/27/2016  . Coronary artery disease   . Abdominal discomfort 06/24/2015  . Hepatic steatosis 06/12/2015  . Right trigeminal neuralgia 11/27/2014  . Chest pain  10/30/2014  . Vitamin B12 deficiency   . OSA (obstructive sleep apnea) 09/04/2014  . Headache 06/20/2014  . Prediabetes 06/08/2014  . Dysphagia 04/19/2014  . Zenker diverticulum 04/19/2014  . COPD, moderate ([place]) 03/26/2014  . S/P CABG x 3 02/07/2014  . Occlusion and stenosis of vertebral artery without mention of cerebral infarction 11/12/2013  . Skin rash 04/17/2013  . Healthcare maintenance 01/09/2013  . Dyslipidemia 01/09/2013  . Lumbosacral radiculopathy at S1 09/14/2012  . Hx of migraines   . Arthritis   . History of repair of aneurysm of abdominal aorta using endovascular stent graft 05/01/2012  . Adenomatous polyps 03/24/2012  . Ex-smoker 03/24/2012  . Hypertension 03/24/2012  . History of bladder cancer 03/24/2012   Past Medical History:  Diagnosis Date  . AAA (abdominal aortic aneurysm) ([place]) 03/25/12   s/p endovascular repair  . Adenomatous polyp 06/2009  . Atrial tachycardia, paroxysmal ([place]) 06/18/2016  . Bladder cancer ([place]) 08/16/2008   recurrence 2015 and 2018 treated with office fulguration  . CAD (coronary artery disease), native coronary artery    S/p 3v CABG 01/2014   . Childhood asthma    as a child  . Complication of anesthesia    "takes me a year to get over"  . Contact dermatitis    atypical [name])  . COPD (chronic obstructive pulmonary disease) ([place])   . Coronary artery disease    a.2015: CABG w/ LIMA-LAD, RIMA-OM, SVG-RCA b. Cath on 07/10/15 w/ patent LIMA-LAD and RIMA-OM. SVG-RCA occluded but collaterals present  . Diverticulosis 2013   severe by CT and colonoscopy  . Environmental allergies    improved as ages  . Fracture of lateral malleolus of left ankle 08/29/2013  . Headache    hasn't had migraines in years  . Hepatic steatosis 06/2015   by [place]  . Hx of migraines   . Hypertension   . Lumbosacral radiculopathy at S1 03/2012   left, with spinal stenosis (MRI 04/2013) improved with TF ESI L5/S1 and S1/2 ([facility] [place])  . OSA  (obstructive sleep apnea) 09/04/2014   Severe with AHI 35/hr, uses BIPAP  . Overweight (BMI 25.0-29.9) 01/26/2017  . Pre-diabetes   . Prediabetes 06/08/2014   CAD   . Resistance to clopidogrel 2015   drug metabolism panel run - asked to scan  . Spinal stenosis    LS1 nerve root impingement from bulging disc  . Vitamin B12 deficiency   . Zenker diverticulum     Family History  Problem Relation Age of Onset  . Diabetes Mother   . Arrhythmia Mother        pacemaker  . Cancer Mother        Bladder  . COPD Mother   . Thyroid disease Mother   .  Hyperlipidemia Mother   . Hypertension Mother   . Diabetes Father   . CAD Father 49       CHF, MI  . Dementia Father   . Heart attack Father   . Diabetes Sister   . Heart attack Maternal Grandmother   . AAA (abdominal aortic aneurysm) Maternal Grandmother   . Colon cancer Neg Hx     Past Surgical History:  Procedure Laterality Date  . ABDOMINAL AORTIC ANEURYSM REPAIR  03/25/12   endovascular  . ABI  05/2013   WNL, L TBI low at 0.66  . Bladder cancer  March 2010 and Oct. 2015   Ernst Spell) x 2  . CARDIAC CATHETERIZATION N/A 07/10/2015   Procedure: Left Heart Cath and Cors/Grafts Angiography;  Surgeon: [name], MD;  Location: [facility];  Service: Cardiovascular;  Laterality: N/A;  . COLONOSCOPY  06/2012   hyperplastic polyp, diverticulosis (jacobs) rec rpt 5 yrs  . COLONOSCOPY WITH PROPOFOL N/A 08/10/2017   TA, rpt 5 yrs Ardis Hughs, Melene Plan, MD)  . CORONARY ARTERY BYPASS GRAFT N/A 02/07/2014   Procedure: CORONARY ARTERY BYPASS GRAFTING (CABG);  Surgeon: [name], MD;  Location: [facility];  Service: Open Heart Surgery;  Laterality: N/A;  CABG X 3, BILATERAL LIMA, EVH  . CYSTOSCOPY  08/16/08   Bladder Cancer  . EPIDURAL BLOCK INJECTION Left 09/2014, 10/2014, 12/2014   medial L2,3,4, dorsal L5 ramus blocks x2, L L5/S1 and S1/2 transforaminal ESI ([facility] [place])  . ESI  04/2013, 06/2013   L L5S1, S12 transforaminal  ESI (Dr.  [name])  . ESI Left 04/2014, 05/2014, 06/2014   L5/S1, S1/2; rpt; L4/5  . ESI  03/2016   R L5/S1 interlaminar ESI  . ESOPHAGEAL MANOMETRY N/A 08/10/2017   Procedure: ESOPHAGEAL MANOMETRY (EM);  Surgeon: [name], MD;  [facility];  Service: Endoscopy;  Laterality: N/A;  . ESOPHAGOGASTRODUODENOSCOPY (EGD) WITH PROPOFOL N/A 08/10/2017   Procedure: ESOPHAGOGASTRODUODENOSCOPY (EGD) WITH PROPOFOL;  Surgeon: [name], MD;  [facility];  Service: Endoscopy;  Laterality: N/A;  . FULGURATION OF BLADDER TUMOR  01/2017   recurrent 24mm L lateral wall papillary transitional cell carcinoma ([place])  . INTRAOPERATIVE TRANSESOPHAGEAL ECHOCARDIOGRAM N/A 02/07/2014   Procedure: INTRAOPERATIVE TRANSESOPHAGEAL ECHOCARDIOGRAM;  Surgeon: [name], MD;  Location: [facility];  Service: Open Heart Surgery;  Laterality: N/A;  . LEFT HEART CATH AND CORS/GRAFTS ANGIOGRAPHY N/A 02/11/2017   Procedure: LEFT HEART CATH AND CORS/GRAFTS ANGIOGRAPHY;  Surgeon: [name], MD;  Location: [facility];  Service: Cardiovascular;  Laterality: N/A;  . LEFT HEART CATHETERIZATION WITH CORONARY ANGIOGRAM N/A 02/01/2014   Procedure: LEFT HEART CATHETERIZATION WITH CORONARY ANGIOGRAM;  Surgeon: [name], MD;  Location: [facility];  Service: Cardiovascular;  Laterality: N/A;  . PRP epidural injection Left 11/2015   L5/S1, S1/2 transforaminal epidural PRP injections under fluoroscopy ([place])  . TONSILLECTOMY AND ADENOIDECTOMY  1973  . TOTAL HIP ARTHROPLASTY Left 03/08/2017  . TOTAL HIP ARTHROPLASTY Left 03/08/2017   Procedure: LEFT TOTAL HIP ARTHROPLASTY ANTERIOR APPROACH;  Surgeon: [name], MD;  Location: [facility];  Service: Orthopedics;  Laterality: Left;  [name] VASECTOMY     Social History   Occupational History  . Occupation: Self Employed  Tobacco Use  . Smoking status: Former Smoker    Packs/day: 1.00    Years: 40.00    Pack  years: 40.00    Types: Cigarettes    Last attempt to quit:  02/05/2014    Years since quitting: 3.5  . Smokeless tobacco: Never Used  Substance and Sexual Activity  . Alcohol use: Yes    Alcohol/week: 0.0 oz    Comment: Rare- states 1 oz. per week  . Drug use: No  . Sexual activity: Not on file

## 2017-09-06 ENCOUNTER — Other Ambulatory Visit: Payer: Self-pay

## 2017-09-06 DIAGNOSIS — I714 Abdominal aortic aneurysm, without rupture, unspecified: Secondary | ICD-10-CM

## 2017-09-09 HISTORY — PX: ZENKER'S DIVERTICULECTOMY: SHX5223

## 2017-09-12 DIAGNOSIS — E785 Hyperlipidemia, unspecified: Secondary | ICD-10-CM | POA: Diagnosis not present

## 2017-09-12 DIAGNOSIS — M1990 Unspecified osteoarthritis, unspecified site: Secondary | ICD-10-CM | POA: Diagnosis not present

## 2017-09-12 DIAGNOSIS — K225 Diverticulum of esophagus, acquired: Secondary | ICD-10-CM | POA: Diagnosis not present

## 2017-09-12 DIAGNOSIS — K449 Diaphragmatic hernia without obstruction or gangrene: Secondary | ICD-10-CM | POA: Diagnosis not present

## 2017-09-12 DIAGNOSIS — Z87891 Personal history of nicotine dependence: Secondary | ICD-10-CM | POA: Diagnosis not present

## 2017-09-12 DIAGNOSIS — I2510 Atherosclerotic heart disease of native coronary artery without angina pectoris: Secondary | ICD-10-CM | POA: Diagnosis not present

## 2017-09-12 DIAGNOSIS — Z79899 Other long term (current) drug therapy: Secondary | ICD-10-CM | POA: Diagnosis not present

## 2017-09-12 DIAGNOSIS — R1310 Dysphagia, unspecified: Secondary | ICD-10-CM | POA: Diagnosis not present

## 2017-09-12 DIAGNOSIS — K2270 Barrett's esophagus without dysplasia: Secondary | ICD-10-CM | POA: Diagnosis not present

## 2017-09-12 DIAGNOSIS — J029 Acute pharyngitis, unspecified: Secondary | ICD-10-CM | POA: Diagnosis not present

## 2017-09-12 DIAGNOSIS — Z885 Allergy status to narcotic agent status: Secondary | ICD-10-CM | POA: Diagnosis not present

## 2017-09-12 DIAGNOSIS — I10 Essential (primary) hypertension: Secondary | ICD-10-CM | POA: Diagnosis not present

## 2017-09-19 ENCOUNTER — Encounter (INDEPENDENT_AMBULATORY_CARE_PROVIDER_SITE_OTHER): Payer: Self-pay | Admitting: Physician Assistant

## 2017-09-19 ENCOUNTER — Ambulatory Visit (INDEPENDENT_AMBULATORY_CARE_PROVIDER_SITE_OTHER): Payer: PPO | Admitting: Physician Assistant

## 2017-09-19 VITALS — Ht 70.0 in | Wt 205.0 lb

## 2017-09-19 DIAGNOSIS — M7062 Trochanteric bursitis, left hip: Secondary | ICD-10-CM | POA: Diagnosis not present

## 2017-09-19 NOTE — Progress Notes (Signed)
Office Visit Note   Patient: Carl Lee           Date of Birth: 09-04-1955           MRN: [id number] Visit Date: 09/19/2017              Requested by: [name], MD [address] [place], [place] [address] PCP: [name], MD   Assessment & Plan: Visit Diagnoses:  1. Trochanteric bursitis, left hip     Plan: We will send him to physical therapy for IT band stretching home exercise program and modalities.  See him back in 6 weeks check his progress lack of.  Did discuss with him the fact that he is weaned off his Neurontin if his burning comes back the can go back on the Neurontin even if it is just a low dose taking it at night.  Follow-Up Instructions: Return in about 6 weeks (around 10/31/2017).   Orders:  No orders of the defined types were placed in this encounter.  No orders of the defined types were placed in this encounter.     Procedures: No procedures performed   Clinical Data: No additional findings.   Subjective: Chief Complaint  Patient presents with  . Left Hip - Follow-up    HPI Carl Lee comes in today due to left hip pain.  He states the trochanteric injection given on 09/05/2017 gave him some with the left however he is having some burning in the hip region and pain on the lateral aspect of the hip whenever he lies on it again.  He has weaned off of his Neurontin which he was taking due to another neuralgia.  He is unsure if this is really made any difference in the burning pain lateral aspect of the hip.  Is having no back pain.  He has had no new injury. Review of Systems See HPI otherwise negative  Objective: Vital Signs: Ht 5\' 10"  (1.778 m)   Wt 205 lb (93 kg)   BMI 29.41 kg/m   Physical Exam General well-developed well-nourished male in no acute distress mood and affect appropriate.  Psych alert and oriented x3. Ortho Exam Bilateral hips he has good range of motion of both hips with internal and external rotation.   Tenderness over the lateral aspect of the left hip only.  He ambulates without any assistive devices and a nonantalgic gait. Specialty Comments:  No specialty comments available.  Imaging: No results found.   PMFS History: Patient Active Problem List   Diagnosis Date Noted  . History of colonic polyps   . Polyp of transverse colon   . Polyp of descending colon   . Diverticulosis of colon without hemorrhage   . Sore throat 08/02/2017  . Urinary frequency 06/30/2017  . Status post total replacement of left hip 03/08/2017  . Abnormal nuclear stress test   . Overweight (BMI 25.0-29.9) 01/26/2017  . Radiculopathy due to lumbar intervertebral disc disorder 10/21/2016  . Osteoarthritis of left hip 08/16/2016  . Atrial tachycardia, paroxysmal ([place]) 06/18/2016  . Chronic fatigue 04/27/2016  . Coronary artery disease   . Abdominal discomfort 06/24/2015  . Hepatic steatosis 06/12/2015  . Right trigeminal neuralgia 11/27/2014  . Chest pain 10/30/2014  . Vitamin B12 deficiency   . OSA (obstructive sleep apnea) 09/04/2014  . Headache 06/20/2014  . Prediabetes 06/08/2014  . Dysphagia 04/19/2014  . Zenker diverticulum 04/19/2014  . COPD, moderate ([place]) 03/26/2014  . S/P CABG x 3 02/07/2014  .  Occlusion and stenosis of vertebral artery without mention of cerebral infarction 11/12/2013  . Skin rash 04/17/2013  . Healthcare maintenance 01/09/2013  . Dyslipidemia 01/09/2013  . Lumbosacral radiculopathy at S1 09/14/2012  . Hx of migraines   . Arthritis   . History of repair of aneurysm of abdominal aorta using endovascular stent graft 05/01/2012  . Adenomatous polyps 03/24/2012  . Ex-smoker 03/24/2012  . Hypertension 03/24/2012  . History of bladder cancer 03/24/2012   Past Medical History:  Diagnosis Date  . AAA (abdominal aortic aneurysm) ([place]) 03/25/12   s/p endovascular repair  . Adenomatous polyp 06/2009  . Atrial tachycardia, paroxysmal ([place]) 06/18/2016  . Bladder cancer ([place])  08/16/2008   recurrence 2015 and 2018 treated with office fulguration  . CAD (coronary artery disease), native coronary artery    S/p 3v CABG 01/2014   . Childhood asthma    as a child  . Complication of anesthesia    "takes me a year to get over"  . Contact dermatitis    atypical [name])  . COPD (chronic obstructive pulmonary disease) ([place])   . Coronary artery disease    a.2015: CABG w/ LIMA-LAD, RIMA-OM, SVG-RCA b. Cath on 07/10/15 w/ patent LIMA-LAD and RIMA-OM. SVG-RCA occluded but collaterals present  . Diverticulosis 2013   severe by CT and colonoscopy  . Environmental allergies    improved as ages  . Fracture of lateral malleolus of left ankle 08/29/2013  . Headache    hasn't had migraines in years  . Hepatic steatosis 06/2015   by [place]  . Hx of migraines   . Hypertension   . Lumbosacral radiculopathy at S1 03/2012   left, with spinal stenosis (MRI 04/2013) improved with TF ESI L5/S1 and S1/2 (Dalton [place])  . OSA (obstructive sleep apnea) 09/04/2014   Severe with AHI 35/hr, uses BIPAP  . Overweight (BMI 25.0-29.9) 01/26/2017  . Pre-diabetes   . Prediabetes 06/08/2014   CAD   . Resistance to clopidogrel 2015   drug metabolism panel run - asked to scan  . Spinal stenosis    LS1 nerve root impingement from bulging disc  . Vitamin B12 deficiency   . Zenker diverticulum     Family History  Problem Relation Age of Onset  . Diabetes Mother   . Arrhythmia Mother        pacemaker  . Cancer Mother        Bladder  . COPD Mother   . Thyroid disease Mother   . Hyperlipidemia Mother   . Hypertension Mother   . Diabetes Father   . CAD Father 72       CHF, MI  . Dementia Father   . Heart attack Father   . Diabetes Sister   . Heart attack Maternal Grandmother   . AAA (abdominal aortic aneurysm) Maternal Grandmother   . Colon cancer Neg Hx     Past Surgical History:  Procedure Laterality Date  . ABDOMINAL AORTIC ANEURYSM REPAIR  03/25/12   endovascular  . ABI   05/2013   WNL, L TBI low at 0.66  . Bladder cancer  March 2010 and Oct. 2015   Ernst Spell) x 2  . CARDIAC CATHETERIZATION N/A 07/10/2015   Procedure: Left Heart Cath and Cors/Grafts Angiography;  Surgeon: [name], MD;  Location: [facility];  Service: Cardiovascular;  Laterality: N/A;  . COLONOSCOPY  06/2012   hyperplastic polyp, diverticulosis (jacobs) rec rpt 5 yrs  . COLONOSCOPY WITH PROPOFOL N/A 08/10/2017  TA, rpt 5 yrs Ardis Hughs, Melene Plan, MD)  . CORONARY ARTERY BYPASS GRAFT N/A 02/07/2014   Procedure: CORONARY ARTERY BYPASS GRAFTING (CABG);  Surgeon: [name], MD;  Location: [facility];  Service: Open Heart Surgery;  Laterality: N/A;  CABG X 3, BILATERAL LIMA, EVH  . CYSTOSCOPY  08/16/08   Bladder Cancer  . EPIDURAL BLOCK INJECTION Left 09/2014, 10/2014, 12/2014   medial L2,3,4, dorsal L5 ramus blocks x2, L L5/S1 and S1/2 transforaminal ESI ([facility] [place])  . ESI  04/2013, 06/2013   L L5S1, S12 transforaminal ESI (Dr.  [name])  . ESI Left 04/2014, 05/2014, 06/2014   L5/S1, S1/2; rpt; L4/5  . ESI  03/2016   R L5/S1 interlaminar ESI  . ESOPHAGEAL MANOMETRY N/A 08/10/2017   Procedure: ESOPHAGEAL MANOMETRY (EM);  Surgeon: [name], MD;  [facility];  Service: Endoscopy;  Laterality: N/A;  . ESOPHAGOGASTRODUODENOSCOPY (EGD) WITH PROPOFOL N/A 08/10/2017   Procedure: ESOPHAGOGASTRODUODENOSCOPY (EGD) WITH PROPOFOL;  Surgeon: [name], MD;  [facility];  Service: Endoscopy;  Laterality: N/A;  . FULGURATION OF BLADDER TUMOR  01/2017   recurrent 57mm L lateral wall papillary transitional cell carcinoma ([place])  . INTRAOPERATIVE TRANSESOPHAGEAL ECHOCARDIOGRAM N/A 02/07/2014   Procedure: INTRAOPERATIVE TRANSESOPHAGEAL ECHOCARDIOGRAM;  Surgeon: [name], MD;  Location: [facility];  Service: Open Heart Surgery;  Laterality: N/A;  . LEFT HEART CATH AND CORS/GRAFTS ANGIOGRAPHY N/A 02/11/2017   Procedure: LEFT HEART CATH AND  CORS/GRAFTS ANGIOGRAPHY;  Surgeon: [name], MD;  Location: [facility];  Service: Cardiovascular;  Laterality: N/A;  . LEFT HEART CATHETERIZATION WITH CORONARY ANGIOGRAM N/A 02/01/2014   Procedure: LEFT HEART CATHETERIZATION WITH CORONARY ANGIOGRAM;  Surgeon: [name], MD;  Location: [facility];  Service: Cardiovascular;  Laterality: N/A;  . PRP epidural injection Left 11/2015   L5/S1, S1/2 transforaminal epidural PRP injections under fluoroscopy ([place])  . TONSILLECTOMY AND ADENOIDECTOMY  1973  . TOTAL HIP ARTHROPLASTY Left 03/08/2017  . TOTAL HIP ARTHROPLASTY Left 03/08/2017   Procedure: LEFT TOTAL HIP ARTHROPLASTY ANTERIOR APPROACH;  Surgeon: [name], MD;  Location: [facility];  Service: Orthopedics;  Laterality: Left;  [name] VASECTOMY     Social History   Occupational History  . Occupation: Self Employed  Tobacco Use  . Smoking status: Former Smoker    Packs/day: 1.00    Years: 40.00    Pack years: 40.00    Types: Cigarettes    Last attempt to quit: 02/05/2014    Years since quitting: 3.6  . Smokeless tobacco: Never Used  Substance and Sexual Activity  . Alcohol use: Yes    Alcohol/week: 0.0 oz    Comment: Rare- states 1 oz. per week  . Drug use: No  . Sexual activity: Not on file

## 2017-09-21 DIAGNOSIS — M25552 Pain in left hip: Secondary | ICD-10-CM | POA: Diagnosis not present

## 2017-09-23 DIAGNOSIS — G4733 Obstructive sleep apnea (adult) (pediatric): Secondary | ICD-10-CM | POA: Diagnosis not present

## 2017-09-25 DIAGNOSIS — G4733 Obstructive sleep apnea (adult) (pediatric): Secondary | ICD-10-CM | POA: Diagnosis not present

## 2017-09-26 DIAGNOSIS — M25552 Pain in left hip: Secondary | ICD-10-CM | POA: Diagnosis not present

## 2017-09-28 DIAGNOSIS — M25552 Pain in left hip: Secondary | ICD-10-CM | POA: Diagnosis not present

## 2017-09-29 ENCOUNTER — Telehealth (INDEPENDENT_AMBULATORY_CARE_PROVIDER_SITE_OTHER): Payer: Self-pay | Admitting: Physician Assistant

## 2017-09-29 NOTE — Telephone Encounter (Signed)
Patient aware he doesn't need this and he is aware I faxed this to his dentist

## 2017-09-29 NOTE — Telephone Encounter (Signed)
Dr.Kemp  [phone]  Pt is requesting a antibiotic   Fax note on how long pt needs to take premeds before dental work. Pt has sereval different dental services needed.

## 2017-10-03 DIAGNOSIS — M25552 Pain in left hip: Secondary | ICD-10-CM | POA: Diagnosis not present

## 2017-10-06 DIAGNOSIS — M25552 Pain in left hip: Secondary | ICD-10-CM | POA: Diagnosis not present

## 2017-10-10 ENCOUNTER — Other Ambulatory Visit: Payer: Self-pay | Admitting: Pulmonary Disease

## 2017-10-11 DIAGNOSIS — M25552 Pain in left hip: Secondary | ICD-10-CM | POA: Diagnosis not present

## 2017-10-26 DIAGNOSIS — G4733 Obstructive sleep apnea (adult) (pediatric): Secondary | ICD-10-CM | POA: Diagnosis not present

## 2017-11-07 ENCOUNTER — Ambulatory Visit (INDEPENDENT_AMBULATORY_CARE_PROVIDER_SITE_OTHER): Payer: PPO | Admitting: Physician Assistant

## 2017-11-07 ENCOUNTER — Encounter (INDEPENDENT_AMBULATORY_CARE_PROVIDER_SITE_OTHER): Payer: Self-pay | Admitting: Physician Assistant

## 2017-11-07 VITALS — Ht 70.0 in | Wt 215.0 lb

## 2017-11-07 DIAGNOSIS — M7062 Trochanteric bursitis, left hip: Secondary | ICD-10-CM | POA: Diagnosis not present

## 2017-11-07 NOTE — Progress Notes (Signed)
Cardiology Office Note:    Date:  11/10/2017   ID:  Armoni, Depass 05-25-1956, MRN [id number]  PCP:  [name], MD  Cardiologist:  Dr. [name]   Electrophysiologist:  N/a Pulmonology: Dr. [name] GI: Dr. Ardis Hughs  Referring MD: [name], MD   No chief complaint on file.   History of Present Illness:    Carl Lee is a 62 y.o. male with a hx of CAD s/p CABG in 2015 with Dr. [name] (L-LAD, RIMA-OM, S-PDA), recurrent bladder CA, carotid artery disease, AAA s/p endovascular repair in 9/13 by Dr. [name], HTN, HL, DM2, OSA, tobacco abuse.   Had cath 02/11/17 for preop right hip surgery clearance. Stable with patent LIMA to mid LAD, RIMA to OM2 Occluded SVG PDA native RCA fills bridging collaterals EF 45-50%   Uneventful right THR Dr [name] 03/09/17   VVS follows PVD with last [place] 11/24/15 residual AAA 4.0 cm   Dr Radford Pax follows his CPAP   He has been having more atypical non exertional pressure in his chest More dyspnea which seems lung related and he needs f/u With Dr [name]        Prior CV studies that were reviewed today include:    Holter Monitor:  Reviewed 03/25/16   SR average HR 72 Isolated PAC;s / PVC;s Some short runs 10-15 beats atrial tachycardia  Echo 11/11/15 Vigorous LVF, EF 65-70%, no RWMA, Gr 1 DD, reduced RVSF, PASP 24 mmHg  24 Hr BP Monitor 4/17 Avg BP in normal range  LHC 02/11/17 reviewed Conclusion     Prox RCA lesion, 100 %stenosed.  Origin to Prox Graft lesion, 100 %stenosed.  Ost LAD to Prox LAD lesion, 100 %stenosed.  LIMA and is normal in caliber.  3rd Mrg lesion, 40 %stenosed.  RIMA and is normal in caliber.  Ost Cx lesion, 55 %stenosed.  Prox Cx lesion, 20 %stenosed.  There is mild left ventricular systolic dysfunction.   Mild LV dysfunction with an ejection fraction of 45-50% with mild mid to basal inferior hypocontractility and a nipple appearing focal apical aneurysm.  Significant native  CAD with total occlusion of the proximal LAD after a prominent septal perforating artery; 55% stenosis prior to an ectatic aneurysmal segment in the proximal circumflex 20% mild narrowing between the OM1 and OM 2 with retrograde filling into the OM 2 via the RIMA graft; and total occlusion of the proximal RCA with bridging collaterals to the mid RCA and collaterals arising from the distal circumflex and septal perforating artery of the LAD supplying the very distal branch vessels.  Patent LIMA graft supplying the mid LAD.  Patent RIMA graft supplying the OM 2 vessel of the circumflex  Old occluded vein graft, which had supplied the PDA region.  RECOMMENDATION: Medical therapy.  The patient will return to the care of Dr. Roosvelt Harps in [place].  Based on his anatomy, which essentially is unchanged from 2 years ago,  cardiac clearance can be given for his hip surgery.         Carotid US (7/15):  Bilateral ICA 1-39%  Past Medical History:  Diagnosis Date  . AAA (abdominal aortic aneurysm) ([place]) 03/25/12   s/p endovascular repair  . Adenomatous polyp 06/2009  . Atrial tachycardia, paroxysmal ([place]) 06/18/2016  . Bladder cancer ([place]) 08/16/2008   recurrence 2015 and 2018 treated with office fulguration  . CAD (coronary artery disease), native coronary artery    S/p 3v CABG 01/2014   . Childhood asthma  as a child  . Complication of anesthesia    "takes me a year to get over"  . Contact dermatitis    atypical [name])  . COPD (chronic obstructive pulmonary disease) ([place])   . Coronary artery disease    a.2015: CABG w/ LIMA-LAD, RIMA-OM, SVG-RCA b. Cath on 07/10/15 w/ patent LIMA-LAD and RIMA-OM. SVG-RCA occluded but collaterals present  . Diverticulosis 2013   severe by CT and colonoscopy  . Environmental allergies    improved as ages  . Fracture of lateral malleolus of left ankle 08/29/2013  . Headache    hasn't had migraines in years  . Hepatic steatosis 06/2015   by [place]  .  Hx of migraines   . Hypertension   . Lumbosacral radiculopathy at S1 03/2012   left, with spinal stenosis (MRI 04/2013) improved with TF ESI L5/S1 and S1/2 ([place] [place])  . OSA (obstructive sleep apnea) 09/04/2014   Severe with AHI 35/hr, uses BIPAP  . Overweight (BMI 25.0-29.9) 01/26/2017  . Pre-diabetes   . Prediabetes 06/08/2014   CAD   . Resistance to clopidogrel 2015   drug metabolism panel run - asked to scan  . Spinal stenosis    LS1 nerve root impingement from bulging disc  . Vitamin B12 deficiency   . Zenker diverticulum     Past Surgical History:  Procedure Laterality Date  . ABDOMINAL AORTIC ANEURYSM REPAIR  03/25/12   endovascular  . ABI  05/2013   WNL, L TBI low at 0.66  . Bladder cancer  March 2010 and Oct. 2015   Ernst Spell) x 2  . CARDIAC CATHETERIZATION N/A 07/10/2015   Procedure: Left Heart Cath and Cors/Grafts Angiography;  Surgeon: [name], MD;  Location: [place];  Service: Cardiovascular;  Laterality: N/A;  . COLONOSCOPY  06/2012   hyperplastic polyp, diverticulosis ([place]) rec rpt 5 yrs  . COLONOSCOPY WITH PROPOFOL N/A 08/10/2017   TA, rpt 5 yrs Ardis Hughs, Melene Plan, MD)  . CORONARY ARTERY BYPASS GRAFT N/A 02/07/2014   Procedure: CORONARY ARTERY BYPASS GRAFTING (CABG);  Surgeon: [name], MD;  Location: [place];  Service: Open Heart Surgery;  Laterality: N/A;  CABG X 3, BILATERAL LIMA, EVH  . CYSTOSCOPY  08/16/08   Bladder Cancer  . EPIDURAL BLOCK INJECTION Left 09/2014, 10/2014, 12/2014   medial L2,3,4, dorsal L5 ramus blocks x2, L L5/S1 and S1/2 transforaminal ESI ([facility] [place])  . ESI  04/2013, 06/2013   L L5S1, S12 transforaminal ESI (Dr.  [name])  . ESI Left 04/2014, 05/2014, 06/2014   L5/S1, S1/2; rpt; L4/5  . ESI  03/2016   R L5/S1 interlaminar ESI  . ESOPHAGEAL MANOMETRY N/A 08/10/2017   Procedure: ESOPHAGEAL MANOMETRY (EM);  Surgeon: [name], MD;  Location: [facility];  Service: Endoscopy;   Laterality: N/A;  . ESOPHAGOGASTRODUODENOSCOPY (EGD) WITH PROPOFOL N/A 08/10/2017   Procedure: ESOPHAGOGASTRODUODENOSCOPY (EGD) WITH PROPOFOL;  Surgeon: [name], MD;  Location: [facility];  Service: Endoscopy;  Laterality: N/A;  . FULGURATION OF BLADDER TUMOR  01/2017   recurrent 40mm L lateral wall papillary transitional cell carcinoma ([place])  . INTRAOPERATIVE TRANSESOPHAGEAL ECHOCARDIOGRAM N/A 02/07/2014   Procedure: INTRAOPERATIVE TRANSESOPHAGEAL ECHOCARDIOGRAM;  Surgeon: [name], MD;  Location: [place];  Service: Open Heart Surgery;  Laterality: N/A;  . LEFT HEART CATH AND CORS/GRAFTS ANGIOGRAPHY N/A 02/11/2017   Procedure: LEFT HEART CATH AND CORS/GRAFTS ANGIOGRAPHY;  Surgeon: [name], MD;  Location: [place] [facility];  Service: Cardiovascular;  Laterality: N/A;  . LEFT HEART CATHETERIZATION WITH CORONARY ANGIOGRAM N/A 02/01/2014   Procedure: LEFT HEART CATHETERIZATION WITH CORONARY ANGIOGRAM;  Surgeon: [name], MD;  Location: [facility];  Service: Cardiovascular;  Laterality: N/A;  . PRP epidural injection Left 11/2015   L5/S1, S1/2 transforaminal epidural PRP injections under fluoroscopy ([place])  . TONSILLECTOMY AND ADENOIDECTOMY  1973  . TOTAL HIP ARTHROPLASTY Left 03/08/2017  . TOTAL HIP ARTHROPLASTY Left 03/08/2017   Procedure: LEFT TOTAL HIP ARTHROPLASTY ANTERIOR APPROACH;  Surgeon: [name], MD;  Location: [place];  Service: Orthopedics;  Laterality: Left;  [name] VASECTOMY      Current Medications: Current Meds  Medication Sig  . acetaminophen (TYLENOL) 325 MG tablet Take 650 mg by mouth every 6 (six) hours as needed for moderate pain or headache.  . albuterol (PROAIR HFA) 108 (90 Base) MCG/ACT inhaler Inhale 2 puffs into the lungs every 6 (six) hours as needed for wheezing or shortness of breath.  [name] aspirin EC 325 MG EC tablet Take 1 tablet (325 mg total) by mouth daily.  [name] atorvastatin (LIPITOR) 20 MG tablet TAKE 1  TABLET (20 MG TOTAL) BY MOUTH DAILY AT 6 PM.  . betamethasone dipropionate (DIPROLENE) 0.05 % ointment   . BREO ELLIPTA 100-25 MCG/INH AEPB INHALE 1 PUFF INTO THE LUNGS DAILY.  . famotidine (PEPCID) 20 MG tablet TAKE 1 TABLET (20 MG TOTAL) BY MOUTH 2 (TWO) TIMES DAILY.  . metoprolol tartrate (LOPRESSOR) 25 MG tablet TAKE 0.5 TABLETS (12.5 MG TOTAL) BY MOUTH 2 (TWO) TIMES DAILY.  [name] Propylene Glycol (SYSTANE BALANCE) 0.6 % SOLN Place 1 drop into both eyes daily as needed (for dry eyes).  . vitamin B-12 (CYANOCOBALAMIN) 500 MCG tablet Take 1 tablet (500 mcg total) by mouth daily. (Patient taking differently: Take 500 mcg by mouth every other day. )  . [DISCONTINUED] amLODipine (NORVASC) 5 MG tablet Take 1 tablet (5 mg total) by mouth daily.     Allergies:   Codeine and Doxycycline   Social History   Socioeconomic History  . Marital status: Married    Spouse name: Not on file  . Number of children: 2  . Years of education: Not on file  . Highest education level: Not on file  Occupational History  . Occupation: Self Employed  Social Needs  . Financial resource strain: Not on file  . Food insecurity:    Worry: Not on file    Inability: Not on file  . Transportation needs:    Medical: Not on file    Non-medical: Not on file  Tobacco Use  . Smoking status: Former Smoker    Packs/day: 1.00    Years: 40.00    Pack years: 40.00    Types: Cigarettes    Last attempt to quit: 02/05/2014    Years since quitting: 3.7  . Smokeless tobacco: Never Used  Substance and Sexual Activity  . Alcohol use: Yes    Alcohol/week: 0.0 oz    Comment: Rare- states 1 oz. per week  . Drug use: No  . Sexual activity: Not on file  Lifestyle  . Physical activity:    Days per week: Not on file    Minutes per session: Not on file  . Stress: Not on file  Relationships  . Social connections:    Talks on phone: Not on file    Gets together: Not on file    Attends religious service: Not on file    Active  member of  club or organization: Not on file    Attends meetings of clubs or organizations: Not on file    Relationship status: Not on file  Other Topics Concern  . Not on file  Social History Narrative   Caffeine: 1 cup coffee/day   Lives with wife, 1 dog   grown children   Occupation: Therapist, sports - self employed.  Disability after CABG   Edu: 2 yrs college   Activity: fishing, walking occasionally   Diet: good water, fruits/vegetables daily     Family History:  The patient's family history includes AAA (abdominal aortic aneurysm) in his maternal grandmother; Arrhythmia in his mother; CAD (age of onset: 49) in his father; COPD in his mother; Cancer in his mother; Dementia in his father; Diabetes in his father, mother, and sister; Heart attack in his father and maternal grandmother; Hyperlipidemia in his mother; Hypertension in his mother; Thyroid disease in his mother.   ROS:   Please see the history of present illness.    Review of Systems  Constitution: Positive for malaise/fatigue.  Eyes: Positive for visual disturbance.  Cardiovascular: Positive for chest pain, dyspnea on exertion and irregular heartbeat.  Respiratory: Positive for wheezing.   Hematologic/Lymphatic: Bruises/bleeds easily.  Skin: Positive for rash.  Musculoskeletal: Positive for back pain.   All other systems reviewed and are negative.   EKGs/Labs/Other Test Reviewed:    EKG:  11/10/17 SR rate 61 nonspecific ST changes   Recent Labs: 02/24/2017: ALT 9 03/09/2017: BUN 14; Creatinine, Ser 0.82; Potassium 3.8; Sodium 136 06/29/2017: Hemoglobin 15.9; Platelets 248.0   Recent Lipid Panel    Component Value Date/Time   CHOL 103 06/29/2017 0955   CHOL 170 01/05/2012   TRIG 63.0 06/29/2017 0955   TRIG 141 01/05/2012   HDL 43.20 06/29/2017 0955   CHOLHDL 2 06/29/2017 0955   VLDL 12.6 06/29/2017 0955   LDLCALC 47 06/29/2017 0955   LDLDIRECT 105 01/05/2012     Physical Exam:    VS:  BP 120/72  (BP Location: Left Arm, Patient Position: Sitting, Cuff Size: Normal)   Pulse 61   Ht 5\' 9"  (1.753 m)   Wt 209 lb (94.8 kg)   BMI 30.86 kg/m     Wt Readings from Last 3 Encounters:  11/10/17 209 lb (94.8 kg)  11/10/17 212 lb (96.2 kg)  11/07/17 215 lb (97.5 kg)     Affect appropriate Obese white male  HEENT: normal Neck supple with no adenopathy JVP normal no bruits no thyromegaly Lungs rhonchi right mid and base no wheezing and good diaphragmatic motion Heart:  S1/S2 no murmur, no rub, gallop or click PMI normal Abdomen: benighn, BS positve, no tenderness, no AAA no bruit.  No HSM or HJR Distal pulses intact with no bruits No edema Neuro non-focal Skin warm and dry Post left THR    ASSESSMENT:    1. Chest pain, unspecified type   2. SOB (shortness of breath)    PLAN:    In order of problems listed above:  1. CAD - s/p CABG. Cath 02/2017 stable anatomy continue medical RX Symptoms Currently sound atypical but will d/c norvasc and start imdur continue beta blocker   2. HTN - better with lower dose ACE   3. HL - Continue statin.   4. AAA s/p Endovascular repair - FU with VVS.  Ordered duplex of carotids and abdomen  5. Fatigue - This is a chronic issue. Labs including TSH ok .  6. Palpitations - Holter 03/2016 PAC;s/  PVC;s benign   7. Zenker's Diverticulum - F/U with Dr Ardis Hughs   8. OSA:  CPAP titration per Dr Radford Pax   9. Ortho:  Post left THR continue PT/OT some trochanteric bursitis consider Deep tissue massage to desensitize   10/  Dyspnea:  Abnormal right lung exam Doubt cardiac etiology will order CXR and make Him f/u appt with Tammy Parret or Dr [name]   [name] MD [facility]

## 2017-11-07 NOTE — Progress Notes (Signed)
HPI: Mr. sexson returns today for follow-up of his left hip trochanteric bursitis.  He is now 8 months status post left total hip arthroplasty.  Date gated physical therapy states it helped with the burning sensation is having lateral aspect hip but he still has pain lateral aspect of the hip with prolonged ambulation lying on the left hip.  Review of systems: Please see HPI otherwise negative  Physical exam: Left hip surgical incisions well-healed no signs of infection.  He has tenderness about the incision site.  Also over the left trochanteric region.  Excellent range of motion of the left hip without pain today.  Left calf supple nontender.  Impression: 8 months status post left total hip arthroplasty Left hip trochanteric bursitis  Plan: We will see him back in 4 months at that time we will obtain AP and lateral views of the left hip.  Recommend he try some deep tissue massage see if this helps desensitize the area.  Continue with home exercise program as taught by therapy.  Follow-up sooner if there is any questions or concerns.

## 2017-11-10 ENCOUNTER — Ambulatory Visit
Admission: RE | Admit: 2017-11-10 | Discharge: 2017-11-10 | Disposition: A | Discharge status: Home / Self Care | Payer: PPO | Source: Ambulatory Visit | Attending: Cardiovascular Disease | Admitting: Cardiovascular Disease

## 2017-11-10 ENCOUNTER — Ambulatory Visit (INDEPENDENT_AMBULATORY_CARE_PROVIDER_SITE_OTHER): Payer: PPO | Admitting: Family

## 2017-11-10 ENCOUNTER — Encounter: Payer: Self-pay | Admitting: Cardiovascular Disease

## 2017-11-10 ENCOUNTER — Encounter: Payer: Self-pay | Admitting: Family

## 2017-11-10 ENCOUNTER — Ambulatory Visit: Payer: PPO | Admitting: Cardiovascular Disease

## 2017-11-10 VITALS — BP 120/72 | HR 61 | Ht 69.0 in | Wt 209.0 lb

## 2017-11-10 VITALS — BP 117/73 | HR 65 | Temp 98.4°F | Resp 18 | Ht 69.0 in | Wt 212.0 lb

## 2017-11-10 DIAGNOSIS — I714 Abdominal aortic aneurysm, without rupture, unspecified: Secondary | ICD-10-CM

## 2017-11-10 DIAGNOSIS — R079 Chest pain, unspecified: Secondary | ICD-10-CM | POA: Diagnosis not present

## 2017-11-10 DIAGNOSIS — F17200 Nicotine dependence, unspecified, uncomplicated: Secondary | ICD-10-CM | POA: Diagnosis not present

## 2017-11-10 DIAGNOSIS — Z95828 Presence of other vascular implants and grafts: Secondary | ICD-10-CM | POA: Diagnosis not present

## 2017-11-10 DIAGNOSIS — R05 Cough: Secondary | ICD-10-CM | POA: Diagnosis not present

## 2017-11-10 DIAGNOSIS — R0602 Shortness of breath: Secondary | ICD-10-CM

## 2017-11-10 MED ORDER — ISOSORBIDE MONONITRATE ER 30 MG PO TB24: 30.0000 mg | ORAL_TABLET | Freq: Every day | ORAL | 3 refills | Status: DC

## 2017-11-10 NOTE — Progress Notes (Signed)
[facility] [place]  CC: Follow up s/p Endovascular Repair of Abdominal Aortic Aneurysm    History of Present Illness  Carl Lee is a 62 y.o. (12/12/1955) male who is status post endovascular repair of a symptomatic abdominal aortic aneurysm by Dr. [name]. This was done using a [facility] Zenith device. Date of surgery was 03/25/2012.   The patient initially presented with abdominal pain to his GI doctor. A CT scan showed a large abdominal aortic aneurysm. On examination he had a very tender aorta and therefore Dr. [name] decided to take him to the operating room for repair. This repair was uncomplicated. He came back to the office several days later with complaints of bilateral flank pain and hypersensitivity in both upper thighs as well as numbness in his thighs. He continues to have back pain. He has had injections which have helped the pain in his left hip however he still has numbness. He has been very fatigued lately.  Pt states he does not have post prandial abdominal pain.  He saw Dr. Ardis Hughs, GI, for a colonoscopy since the EVAR.  He has spinal stenosis in his lumbar spine, states he gets temporary relief from pain with back injections, last injections. He also sees a pulmonologist, Dr. Sherrin Daisy, states his dyspnea continues.   He uses BiPAP for his OSA.  He had a repair of what sounds like and esophogeal diverticulum at [facility] in March 2019. He had a left hip replacement August 2018, states much relief since this, able to walk much better.   He is s/p CABG X3 vessels in July 2015; he denies any hx of MI. Pt states he has felt tired since this CABG, states his cardiologist, Dr. [name], is aware.  He denies non healing wounds in in his feet or lower legs.  Pt denies any hx of stroke or TIA  Pt states he has mid and LLQ intermittent abdominal pain since about January 2019, states he told his GI doctor; pain is not worse over time.   His legs ache at  night rarely, after he has been walking lots of walking  11-28-15, Dr. [name] evaluated pt on 11-28-15. At that time CTA abd/pelvis demonstrated aortic aneurysm sac was excluded and continued to decrease in size. No evidence for an endoleak. Stent graft remained patent but there was increased mural thrombus in the main stent body as described. No acute abnormalities in the abdomen or pelvis. Colonic diverticulosis. The patient has had difficulty with visualization on ultrasound.  He had a CT scan that day that showed that the maximum diameter of his aneurysm had decreased from 5.2 cm, down to 4.4 cm that day.  There were no complicating features.    Because he cannot be visualized with ultrasound,Dr. [name] recommended getting a repeat CT scan in 2 years.  CTA abd/pelvis scheduled for 12-26-17.     Diabetic: Yes, 6.4 A1C on 06-29-17 Tobaccos use: current smoker, he smokes one cigarette once every 6 months  He takes a daily 325 mg ASA, and no other antiplatelets nor anticoagulants.  He takes a daily statin    Past Medical History:  Diagnosis Date  . AAA (abdominal aortic aneurysm) ([place]) 03/25/12   s/p endovascular repair  . Adenomatous polyp 06/2009  . Atrial tachycardia, paroxysmal ([place]) 06/18/2016  . Bladder cancer ([place]) 08/16/2008   recurrence 2015 and 2018 treated with office fulguration  . CAD (coronary artery disease), native coronary artery    S/p 3v CABG 01/2014   .  Childhood asthma    as a child  . Complication of anesthesia    "takes me a year to get over"  . Contact dermatitis    atypical [name])  . COPD (chronic obstructive pulmonary disease) ([place])   . Coronary artery disease    a.2015: CABG w/ LIMA-LAD, RIMA-OM, SVG-RCA b. Cath on 07/10/15 w/ patent LIMA-LAD and RIMA-OM. SVG-RCA occluded but collaterals present  . Diverticulosis 2013   severe by CT and colonoscopy  . Environmental allergies    improved as ages  . Fracture of lateral malleolus of left ankle  08/29/2013  . Headache    hasn't had migraines in years  . Hepatic steatosis 06/2015   by [place]  . Hx of migraines   . Hypertension   . Lumbosacral radiculopathy at S1 03/2012   left, with spinal stenosis (MRI 04/2013) improved with TF ESI L5/S1 and S1/2 ([facility] [place]ar)  . OSA (obstructive sleep apnea) 09/04/2014   Severe with AHI 35/hr, uses BIPAP  . Overweight (BMI 25.0-29.9) 01/26/2017  . Pre-diabetes   . Prediabetes 06/08/2014   CAD   . Resistance to clopidogrel 2015   drug metabolism panel run - asked to scan  . Spinal stenosis    LS1 nerve root impingement from bulging disc  . Vitamin B12 deficiency   . Zenker diverticulum    Past Surgical History:  Procedure Laterality Date  . ABDOMINAL AORTIC ANEURYSM REPAIR  03/25/12   endovascular  . ABI  05/2013   WNL, L TBI low at 0.66  . Bladder cancer  March 2010 and Oct. 2015   Ernst Spell) x 2  . CARDIAC CATHETERIZATION N/A 07/10/2015   Procedure: Left Heart Cath and Cors/Grafts Angiography;  Surgeon: [name], MD;  Location: [facility];  Service: Cardiovascular;  Laterality: N/A;  . COLONOSCOPY  06/2012   hyperplastic polyp, diverticulosis (jacobs) rec rpt 5 yrs  . COLONOSCOPY WITH PROPOFOL N/A 08/10/2017   TA, rpt 5 yrs Ardis Hughs, Melene Plan, MD)  . CORONARY ARTERY BYPASS GRAFT N/A 02/07/2014   Procedure: CORONARY ARTERY BYPASS GRAFTING (CABG);  Surgeon: [name], MD;  Location: [facility];  Service: Open Heart Surgery;  Laterality: N/A;  CABG X 3, BILATERAL LIMA, EVH  . CYSTOSCOPY  08/16/08   Bladder Cancer  . EPIDURAL BLOCK INJECTION Left 09/2014, 10/2014, 12/2014   medial L2,3,4, dorsal L5 ramus blocks x2, L L5/S1 and S1/2 transforaminal ESI ([place] Sentinel)  . ESI  04/2013, 06/2013   L L5S1, S12 transforaminal ESI (Dr.  [name])  . ESI Left 04/2014, 05/2014, 06/2014   L5/S1, S1/2; rpt; L4/5  . ESI  03/2016   R L5/S1 interlaminar ESI  . ESOPHAGEAL MANOMETRY N/A 08/10/2017   Procedure: ESOPHAGEAL  MANOMETRY (EM);  Surgeon: [name], MD;  Location: [facility];  Service: Endoscopy;  Laterality: N/A;  . ESOPHAGOGASTRODUODENOSCOPY (EGD) WITH PROPOFOL N/A 08/10/2017   Procedure: ESOPHAGOGASTRODUODENOSCOPY (EGD) WITH PROPOFOL;  Surgeon: [name], MD;  Location: [facility];  Service: Endoscopy;  Laterality: N/A;  . FULGURATION OF BLADDER TUMOR  01/2017   recurrent 67mm L lateral wall papillary transitional cell carcinoma ([place])  . INTRAOPERATIVE TRANSESOPHAGEAL ECHOCARDIOGRAM N/A 02/07/2014   Procedure: INTRAOPERATIVE TRANSESOPHAGEAL ECHOCARDIOGRAM;  Surgeon: [name], MD;  Location: [facility];  Service: Open Heart Surgery;  Laterality: N/A;  . LEFT HEART CATH AND CORS/GRAFTS ANGIOGRAPHY N/A 02/11/2017   Procedure: LEFT HEART CATH AND CORS/GRAFTS ANGIOGRAPHY;  Surgeon: [name], MD;  Location: [facility];  Service: Cardiovascular;  Laterality: N/A;  . LEFT HEART CATHETERIZATION WITH CORONARY ANGIOGRAM N/A 02/01/2014   Procedure: LEFT HEART CATHETERIZATION WITH CORONARY ANGIOGRAM;  Surgeon: [name], MD;  Location: [facility];  Service: Cardiovascular;  Laterality: N/A;  . PRP epidural injection Left 11/2015   L5/S1, S1/2 transforaminal epidural PRP injections under fluoroscopy ([place])  . TONSILLECTOMY AND ADENOIDECTOMY  1973  . TOTAL HIP ARTHROPLASTY Left 03/08/2017  . TOTAL HIP ARTHROPLASTY Left 03/08/2017   Procedure: LEFT TOTAL HIP ARTHROPLASTY ANTERIOR APPROACH;  Surgeon: [name], MD;  Location: [facility];  Service: Orthopedics;  Laterality: Left;  [name] VASECTOMY     Social History Social History   Tobacco Use  . Smoking status: Former Smoker    Packs/day: 1.00    Years: 40.00    Pack years: 40.00    Types: Cigarettes    Last attempt to quit: 02/05/2014    Years since quitting: 3.7  . Smokeless tobacco: Never Used  Substance Use Topics  . Alcohol use: Yes    Alcohol/week: 0.0 oz    Comment: Rare- states 1 oz.  per week  . Drug use: No   Family History Family History  Problem Relation Age of Onset  . Diabetes Mother   . Arrhythmia Mother        pacemaker  . Cancer Mother        Bladder  . COPD Mother   . Thyroid disease Mother   . Hyperlipidemia Mother   . Hypertension Mother   . Diabetes Father   . CAD Father 18       CHF, MI  . Dementia Father   . Heart attack Father   . Diabetes Sister   . Heart attack Maternal Grandmother   . AAA (abdominal aortic aneurysm) Maternal Grandmother   . Colon cancer Neg Hx    Current Outpatient Medications on File Prior to Visit  Medication Sig Dispense Refill  . acetaminophen (TYLENOL) 325 MG tablet Take 650 mg by mouth every 6 (six) hours as needed for moderate pain or headache.    . albuterol (PROAIR HFA) 108 (90 Base) MCG/ACT inhaler Inhale 2 puffs into the lungs every 6 (six) hours as needed for wheezing or shortness of breath. 1 Inhaler 2  . amLODipine (NORVASC) 5 MG tablet Take 1 tablet (5 mg total) by mouth daily. 90 tablet 3  . aspirin EC 325 MG EC tablet Take 1 tablet (325 mg total) by mouth daily.    [name] atorvastatin (LIPITOR) 20 MG tablet TAKE 1 TABLET (20 MG TOTAL) BY MOUTH DAILY AT 6 PM. 90 tablet 2  . betamethasone dipropionate (DIPROLENE) 0.05 % ointment     . BREO ELLIPTA 100-25 MCG/INH AEPB INHALE 1 PUFF INTO THE LUNGS DAILY. 60 each 5  . famotidine (PEPCID) 20 MG tablet TAKE 1 TABLET (20 MG TOTAL) BY MOUTH 2 (TWO) TIMES DAILY. 180 tablet 3  . gabapentin (NEURONTIN) 300 MG capsule Take 1 capsule (300 mg total) by mouth 2 (two) times daily. First 3 days take once nightly 60 capsule 3  . metoprolol tartrate (LOPRESSOR) 25 MG tablet TAKE 0.5 TABLETS (12.5 MG TOTAL) BY MOUTH 2 (TWO) TIMES DAILY. 90 tablet 2  . Propylene Glycol (SYSTANE BALANCE) 0.6 % SOLN Place 1 drop into both eyes daily as needed (for dry eyes).    . vitamin B-12 (CYANOCOBALAMIN) 500 MCG tablet Take 1 tablet (500 mcg total) by mouth daily. (Patient taking differently: Take  500 mcg by  mouth every other day. )     No current facility-administered medications on file prior to visit.    Allergies  Allergen Reactions  . Codeine Other (See Comments)    Nightmares  . Doxycycline Hives and Other (See Comments)    mild     ROS: See HPI for pertinent positives and negatives.  Physical Examination  Vitals:   11/10/17 0904  BP: 117/73  Pulse: 65  Resp: 18  Temp: 98.4 F (36.9 C)  TempSrc: Oral  SpO2: 96%  Weight: 212 lb (96.2 kg)  Height: 5\' 9"  (1.753 m)   Body mass index is 31.31 kg/m.  General: A&O x 3, WD, obese male HEENT: No gross abnormalities  Pulmonary: Sym exp, respirations are non labored, limited air movement in all fields, + rales in bilateral bases, few rhonchi, and transient expiratory wheezes . Cardiac: Regular rhythm and rate, no murmur appreciated  Vascular: Vessel Right Left  Radial 1+Palpable 1+Palpable  Carotid  without bruit  without bruit  Aorta Not palpable N/A  Femoral 1+Palpable 1+Palpable  Popliteal 1+ palpable 1+palpable  PT 2+Palpable 2+Palpable  DP 2+Palpable 1+Palpable   Gastrointestinal: soft, moderately tender to palpation LLQ, -G/R, - HSM, - palpable masses, - CVAT B. Musculoskeletal: M/S 5/5 throughout, extremities without ischemic changes. Skin: No rashes, no ulcers, no cellulitis.   Neurologic: Pain and light touch intact in extremities, Motor exam as listed above. Psychiatric: Normal thought content, mood appropriate for clinical situation.     Medical Decision Making  Carl Lee is a 62 y.o. male who presents s/p EVAR (Date:  03/25/2012).  He returned today for a provider examination prior to his 2 year CTA abd/pelvis follow up.   I discussed with the patient the importance of surveillance of the endograft.  The next CTA is scheduled for 12-26-17.  The patient is scheduled that same day to see Dr. [name] afterward.   Over 3 minutes was spent counseling patient re smoking cessation, and patient  was given several free resources re smoking cessation.    I emphasized the importance of maximal medical management including strict control of blood pressure, blood glucose, and lipid levels, antiplatelet agents, obtaining regular exercise, and cessation of smoking.   Thank you for allowing [place] to participate in this patient's care.  Clemon Chambers, RN, MSN, FNP-C [facility] [place] Office: [facility] Physician: Oneida Alar  11/10/2017, 9:15 AM

## 2017-11-10 NOTE — Patient Instructions (Addendum)
Medication Instructions:  Your physician has recommended you make the following change in your medication:  1-STOP Norvasc 2-START Imdur 30 mg by mouth daily  Labwork: NONE  Testing/Procedures: A chest x-ray takes a picture of the organs and structures inside the chest, including the heart, lungs, and blood vessels. This test can show several things, including, whether the heart is enlarges; whether fluid is building up in the lungs; and whether pacemaker / defibrillator leads are still in place. Go to [facility] at [address]   Follow-Up: Your physician wants you to follow-up in: 6 months with Dr. [name]. You will receive a reminder letter in the mail two months in advance. If you don't receive a letter, please call our office to schedule the follow-up appointment.   If you need a refill on your cardiac medications before your next appointment, please call your pharmacy.

## 2017-11-14 ENCOUNTER — Telehealth: Payer: Self-pay | Admitting: Cardiovascular Disease

## 2017-11-14 DIAGNOSIS — N401 Enlarged prostate with lower urinary tract symptoms: Secondary | ICD-10-CM | POA: Diagnosis not present

## 2017-11-14 DIAGNOSIS — R350 Frequency of micturition: Secondary | ICD-10-CM | POA: Diagnosis not present

## 2017-11-14 DIAGNOSIS — C672 Malignant neoplasm of lateral wall of bladder: Secondary | ICD-10-CM | POA: Diagnosis not present

## 2017-11-14 NOTE — Telephone Encounter (Signed)
New message   Patient calling for imaging results. Please call

## 2017-11-14 NOTE — Telephone Encounter (Signed)
Called patient with chest xray results. Will send copy of chest xray to patient's PCP.

## 2017-11-16 ENCOUNTER — Ambulatory Visit (INDEPENDENT_AMBULATORY_CARE_PROVIDER_SITE_OTHER)
Admission: RE | Admit: 2017-11-16 | Discharge: 2017-11-16 | Disposition: A | Discharge status: Home / Self Care | Payer: PPO | Source: Ambulatory Visit | Attending: Adult Health | Admitting: Adult Health

## 2017-11-16 ENCOUNTER — Encounter: Payer: Self-pay | Admitting: Adult Health

## 2017-11-16 ENCOUNTER — Ambulatory Visit: Payer: PPO | Admitting: Adult Health

## 2017-11-16 VITALS — BP 100/62 | Ht 68.0 in | Wt 211.0 lb

## 2017-11-16 DIAGNOSIS — N3000 Acute cystitis without hematuria: Secondary | ICD-10-CM | POA: Diagnosis not present

## 2017-11-16 DIAGNOSIS — R300 Dysuria: Secondary | ICD-10-CM | POA: Diagnosis not present

## 2017-11-16 DIAGNOSIS — R350 Frequency of micturition: Secondary | ICD-10-CM | POA: Diagnosis not present

## 2017-11-16 DIAGNOSIS — R0602 Shortness of breath: Secondary | ICD-10-CM

## 2017-11-16 DIAGNOSIS — J449 Chronic obstructive pulmonary disease, unspecified: Secondary | ICD-10-CM

## 2017-11-16 DIAGNOSIS — G4733 Obstructive sleep apnea (adult) (pediatric): Secondary | ICD-10-CM | POA: Diagnosis not present

## 2017-11-16 DIAGNOSIS — R918 Other nonspecific abnormal finding of lung field: Secondary | ICD-10-CM | POA: Diagnosis not present

## 2017-11-16 DIAGNOSIS — N410 Acute prostatitis: Secondary | ICD-10-CM | POA: Diagnosis not present

## 2017-11-16 MED ORDER — LEVALBUTEROL HCL 0.63 MG/3ML IN NEBU
0.6300 mg | INHALATION_SOLUTION | RESPIRATORY_TRACT | Status: AC
  Administered 2017-11-16: 0.63 mg via RESPIRATORY_TRACT

## 2017-11-16 MED ORDER — PREDNISONE 10 MG PO TABS: ORAL_TABLET | ORAL | 0 refills | Status: DC

## 2017-11-16 NOTE — Patient Instructions (Signed)
Finish Bactrim as directed Prednisone taper over the next week Mucinex DM twice daily as needed for cough and congestion Chest x-ray today Follow up with Dr. [name] in 2-3 months and As needed   Please contact office for sooner follow up if symptoms do not improve or worsen or seek emergency care

## 2017-11-16 NOTE — Progress Notes (Signed)
Reviewed, agree 

## 2017-11-16 NOTE — Addendum Note (Signed)
Addended by: Madolyn Frieze on: 11/16/2017 03:13 PM   Modules accepted: Orders

## 2017-11-16 NOTE — Assessment & Plan Note (Signed)
Cont on BIPAP .   

## 2017-11-16 NOTE — Assessment & Plan Note (Addendum)
Exacerbation  xopenex neb x 1   Plan Patient Instructions  Finish Bactrim as directed Prednisone taper over the next week Mucinex DM twice daily as needed for cough and congestion Chest x-ray today Follow up with Dr. [name] in 2-3 months and As needed   Please contact office for sooner follow up if symptoms do not improve or worsen or seek emergency care

## 2017-11-16 NOTE — Progress Notes (Signed)
@Patient  ID: Carl Lee, male    DOB: [birth year], 62 y.o.   MRN: [id number]  Chief Complaint  Patient presents with  . Follow-up    COPD     Referring provider: [name], MD  HPI: 62 yo male former smoker with a CABG in 2015 and pulmonary critical care medicine was consulted for a COPD exacerbation. July 2015 lung function testing showed a ratio 62%, FEV1 of 2.10 L (56% predicted), total lung capacity 6.35 L (80% predicted). He smoked for many years and quit smoking in 2015. April 2017 pulmonary function testing: FEV1 1.8 L, total lung capacity 99% predicted, DLCO 39% predicted 11/12/2015 TTE > normal LVEF, RV mildly dilated and systolic function mildly reduced OSA -BIPAP    11/16/2017 Acute OV : COPD  Patient presents for an acute office visit.  Patient complains over the last 4 weeks has had increased cough, congestion with thick mucus intermittent wheezing sinus congestion drainage sore throat. And chest tightness . No otc used. Chest xray today last week showed nodular opacities , recommended to have repeat with nipple markers.   He was previously on ANORO last year and did well but insurance would not cover so he is now on BREO daily.   Seen by Urology today , has UTI started on Bactrim .  Has hx of bladder cancer.   Has CAD s/p CABG . REcently started on Imdur .   Allergies  Allergen Reactions  . Codeine Other (See Comments)    Nightmares  . Doxycycline Hives and Other (See Comments)    mild    Immunization History  Administered Date(s) Administered  . Influenza,inj,Quad PF,6+ Mos 04/17/2013, 06/17/2014, 05/01/2015, 03/25/2016, 04/01/2017  . Pneumococcal Conjugate-13 05/01/2015  . Pneumococcal Polysaccharide-23 06/17/2014  . Tdap 12/14/2012  . Zoster 12/23/2015    Past Medical History:  Diagnosis Date  . AAA (abdominal aortic aneurysm) ([place]) 03/25/12   s/p endovascular repair  . Adenomatous polyp 06/2009  . Atrial tachycardia, paroxysmal ([place])  06/18/2016  . Bladder cancer ([place]) 08/16/2008   recurrence 2015 and 2018 treated with office fulguration  . CAD (coronary artery disease), native coronary artery    S/p 3v CABG 01/2014   . Childhood asthma    as a child  . Complication of anesthesia    "takes me a year to get over"  . Contact dermatitis    atypical [name])  . COPD (chronic obstructive pulmonary disease) ([place])   . Coronary artery disease    a.2015: CABG w/ LIMA-LAD, RIMA-OM, SVG-RCA b. Cath on 07/10/15 w/ patent LIMA-LAD and RIMA-OM. SVG-RCA occluded but collaterals present  . Diverticulosis 2013   severe by CT and colonoscopy  . Environmental allergies    improved as ages  . Fracture of lateral malleolus of left ankle 08/29/2013  . Headache    hasn't had migraines in years  . Hepatic steatosis 06/2015   by [place]  . Hx of migraines   . Hypertension   . Lumbosacral radiculopathy at S1 03/2012   left, with spinal stenosis (MRI 04/2013) improved with TF ESI L5/S1 and S1/2 ([facility] [place])  . OSA (obstructive sleep apnea) 09/04/2014   Severe with AHI 35/hr, uses BIPAP  . Overweight (BMI 25.0-29.9) 01/26/2017  . Pre-diabetes   . Prediabetes 06/08/2014   CAD   . Resistance to clopidogrel 2015   drug metabolism panel run - asked to scan  . Spinal stenosis    LS1 nerve root impingement from bulging disc  . Vitamin B12  deficiency   . Zenker diverticulum     Tobacco History: Social History   Tobacco Use  Smoking Status Former Smoker  . Packs/day: 1.00  . Years: 40.00  . Pack years: 40.00  . Types: Cigarettes  . Last attempt to quit: 02/05/2014  . Years since quitting: 3.7  Smokeless Tobacco Never Used   Counseling given: Not Answered   Outpatient Encounter Medications as of 11/16/2017  Medication Sig  . acetaminophen (TYLENOL) 325 MG tablet Take 650 mg by mouth every 6 (six) hours as needed for moderate pain or headache.  . albuterol (PROAIR HFA) 108 (90 Base) MCG/ACT inhaler Inhale 2 puffs into the lungs  every 6 (six) hours as needed for wheezing or shortness of breath.  [name] aspirin EC 325 MG EC tablet Take 1 tablet (325 mg total) by mouth daily.  [name] atorvastatin (LIPITOR) 20 MG tablet TAKE 1 TABLET (20 MG TOTAL) BY MOUTH DAILY AT 6 PM.  . betamethasone dipropionate (DIPROLENE) 0.05 % ointment   . BREO ELLIPTA 100-25 MCG/INH AEPB INHALE 1 PUFF INTO THE LUNGS DAILY.  . famotidine (PEPCID) 20 MG tablet TAKE 1 TABLET (20 MG TOTAL) BY MOUTH 2 (TWO) TIMES DAILY.  [name] gabapentin (NEURONTIN) 300 MG capsule Take 1 capsule (300 mg total) by mouth 2 (two) times daily. First 3 days take once nightly  . isosorbide mononitrate (IMDUR) 30 MG 24 hr tablet Take 1 tablet (30 mg total) by mouth daily.  [name] Propylene Glycol (SYSTANE BALANCE) 0.6 % SOLN Place 1 drop into both eyes daily as needed (for dry eyes).  [name] sulfamethoxazole-trimethoprim (BACTRIM,SEPTRA) 400-80 MG tablet Take 1 tablet by mouth 2 (two) times daily.  . vitamin B-12 (CYANOCOBALAMIN) 500 MCG tablet Take 1 tablet (500 mcg total) by mouth daily. (Patient taking differently: Take 500 mcg by mouth every other day. )  . metoprolol tartrate (LOPRESSOR) 25 MG tablet TAKE 0.5 TABLETS (12.5 MG TOTAL) BY MOUTH 2 (TWO) TIMES DAILY.  . tamsulosin (FLOMAX) 0.4 MG CAPS capsule Take 1 capsule by mouth daily.   No facility-administered encounter medications on file as of 11/16/2017.      Review of Systems  Constitutional:   No  weight loss, night sweats,  Fevers, chills, fatigue, or  lassitude.  HEENT:   No headaches,  Difficulty swallowing,  Tooth/dental problems, or  Sore throat,                No sneezing, itching, ear ache,  +nasal congestion, post nasal drip,   CV:  No chest pain,  Orthopnea, PND, swelling in lower extremities, anasarca, dizziness, palpitations, syncope.   GI  No heartburn, indigestion, abdominal pain, nausea, vomiting, diarrhea, change in bowel habits, loss of appetite, bloody stools.   Resp:    No chest wall deformity  Skin: no rash or  lesions.  GU: no dysuria, change in color of urine, no urgency or frequency.  No flank pain, no hematuria   MS:  No joint pain or swelling.  No decreased range of motion.  No back pain.    Physical Exam  BP 100/62 (BP Location: Left Arm, Cuff Size: Normal)   Ht 5\' 8"  (1.727 m)   Wt 211 lb (95.7 kg)   SpO2 (P) 96%   BMI 32.08 kg/m   GEN: A/Ox3; pleasant , NAD, elderly    HEENT:  [place]/AT,  EACs-clear, TMs-wnl, NOSE-clear drainage , max tenderness  THROAT-clear, no lesions, no postnasal drip or exudate noted.   NECK:  Supple w/ fair ROM;  no JVD; normal carotid impulses w/o bruits; no thyromegaly or nodules palpated; no lymphadenopathy.    RESP  Few trace rhonchi and exp wheezing , no accessory muscle use, no dullness to percussion  CARD:  RRR, no m/r/g, no peripheral edema, pulses intact, no cyanosis or clubbing.  GI:   Soft & nt; nml bowel sounds; no organomegaly or masses detected.   Musco: Warm bil, no deformities or joint swelling noted.   Neuro: alert, no focal deficits noted.    Skin: Warm, no lesions or rashes    Lab Results:  CBC    Component Value Date/Time   WBC 7.7 06/29/2017 0955   RBC 4.99 06/29/2017 0955   HGB 15.9 06/29/2017 0955   HGB 16.1 02/10/2017 1103   HCT 47.3 06/29/2017 0955   HCT 46.7 02/10/2017 1103   PLT 248.0 06/29/2017 0955   PLT 291 02/10/2017 1103   MCV 94.9 06/29/2017 0955   MCV 91 02/10/2017 1103   MCV 96 01/09/2014 1237   MCH 31.0 03/09/2017 0549   MCHC 33.7 06/29/2017 0955   RDW 14.1 06/29/2017 0955   RDW 13.6 02/10/2017 1103   RDW 13.5 01/09/2014 1237   LYMPHSABS 2.0 06/29/2017 0955   LYMPHSABS 2.6 02/10/2017 1103   MONOABS 0.7 06/29/2017 0955   EOSABS 0.3 06/29/2017 0955   EOSABS 0.4 02/10/2017 1103   BASOSABS 0.1 06/29/2017 0955   BASOSABS 0.1 02/10/2017 1103    BMET    Component Value Date/Time   NA 136 03/09/2017 0549   NA 143 02/08/2017 1031   NA 138 01/09/2014 1237   K 3.8 03/09/2017 0549   K 3.7 01/09/2014  1237   CL 101 03/09/2017 0549   CL 104 01/09/2014 1237   CO2 26 03/09/2017 0549   CO2 27 01/09/2014 1237   GLUCOSE 126 (H) 03/09/2017 0549   GLUCOSE 83 01/09/2014 1237   BUN 14 03/09/2017 0549   BUN 13 02/08/2017 1031   BUN 19 (H) 01/09/2014 1237   CREATININE 0.82 03/09/2017 0549   CREATININE 1.16 03/19/2016 1121   CALCIUM 8.5 (L) 03/09/2017 0549   CALCIUM 8.6 01/09/2014 1237   GFRNONAA >60 03/09/2017 0549   GFRNONAA >60 01/09/2014 1237   GFRAA >60 03/09/2017 0549   GFRAA >60 01/09/2014 1237    BNP No results found for: BNP  ProBNP No results found for: PROBNP  Imaging: Dg Chest 2 View  Result Date: 11/10/2017 CLINICAL DATA:  Cough, shortness of breath, chest pain. EXAM: CHEST - 2 VIEW COMPARISON:  Radiographs of March 19, 2016. FINDINGS: The heart size and mediastinal contours are within normal limits. Status post coronary artery bypass graft. No pneumothorax or pleural effusion is noted. Right lung is clear. Nodular density seen projected over left lung base which may represent overlying nipple shadow. The visualized skeletal structures are unremarkable. IMPRESSION: Nodular density seen projected over the left lung base which may represent overlying nipple shadow; repeat radiograph with nipple markers is recommended to rule out pulmonary nodule or mass. No other significant abnormality seen in the chest. Electronically Signed   By: Marijo Conception, M.D.   On: 11/10/2017 16:41     Assessment & Plan:   COPD, moderate ([place]) Exacerbation  xopenex neb x 1   Plan Patient Instructions  Finish Bactrim as directed Prednisone taper over the next week Mucinex DM twice daily as needed for cough and congestion Chest x-ray today Follow up with Dr. [name] in 2-3 months and As needed   Please contact office  for sooner follow up if symptoms do not improve or worsen or seek emergency care        OSA (obstructive sleep apnea) Cont on BIPAP      Kateland Leisinger,  NP 11/16/2017

## 2017-11-16 NOTE — Addendum Note (Signed)
Addended by: Madolyn Frieze on: 11/16/2017 03:24 PM   Modules accepted: Orders

## 2017-11-18 ENCOUNTER — Telehealth: Payer: Self-pay | Admitting: Cardiovascular Disease

## 2017-11-18 DIAGNOSIS — N3000 Acute cystitis without hematuria: Secondary | ICD-10-CM | POA: Diagnosis not present

## 2017-11-18 DIAGNOSIS — C672 Malignant neoplasm of lateral wall of bladder: Secondary | ICD-10-CM | POA: Diagnosis not present

## 2017-11-18 DIAGNOSIS — N410 Acute prostatitis: Secondary | ICD-10-CM | POA: Diagnosis not present

## 2017-11-18 NOTE — Telephone Encounter (Signed)
New Message   Pt c/o medication issue:  1. Name of Medication: isosorbide mononitrate (IMDUR) 30 MG 24 hr tablet  2. How are you currently taking this medication (dosage and times per day)? Take 1 tablet (30 mg total) by mouth daily  3. Are you having a reaction (difficulty breathing--STAT)? yes  4. What is your medication issue? Pt states the medication is not working, he is having headaches, chest pain and his bps have been ranging from 135/95 and 90/60. Pt states he will be stopping this medication and wants to go back to his old medication. Please call

## 2017-11-18 NOTE — Telephone Encounter (Signed)
Called patient back about his message. Patient stated he is unable to tolerate Imdur and he wants to go back on the Norvasc. Patient stated that Imdur is causing him severe headaches and has not helped his chest pain. Patient complaining about his BP being up and down, and as low as 90/60. Consulted DOD, Dr. Angelena Form about switching back, he stated that should be fine. Will forward to Dr. [name] so he is aware and wants to make any other changes.   Called patient back with Dr. Camillia Herter response. Patient is agreeable to switching back to Norvasc. Will update medication list.

## 2017-11-23 DIAGNOSIS — C672 Malignant neoplasm of lateral wall of bladder: Secondary | ICD-10-CM | POA: Diagnosis not present

## 2017-11-23 DIAGNOSIS — N410 Acute prostatitis: Secondary | ICD-10-CM | POA: Diagnosis not present

## 2017-11-23 DIAGNOSIS — N3000 Acute cystitis without hematuria: Secondary | ICD-10-CM | POA: Diagnosis not present

## 2017-11-24 ENCOUNTER — Encounter: Payer: Self-pay | Admitting: Family Medicine

## 2017-11-24 DIAGNOSIS — N3000 Acute cystitis without hematuria: Secondary | ICD-10-CM | POA: Insufficient documentation

## 2017-11-25 DIAGNOSIS — G4733 Obstructive sleep apnea (adult) (pediatric): Secondary | ICD-10-CM | POA: Diagnosis not present

## 2017-11-29 ENCOUNTER — Encounter: Payer: Self-pay | Admitting: Adult Health

## 2017-11-29 ENCOUNTER — Ambulatory Visit: Payer: PPO | Admitting: Adult Health

## 2017-11-29 ENCOUNTER — Other Ambulatory Visit: Payer: Self-pay | Admitting: Pulmonary Disease

## 2017-11-29 VITALS — BP 116/70 | HR 83 | Ht 69.0 in | Wt 208.4 lb

## 2017-11-29 DIAGNOSIS — J449 Chronic obstructive pulmonary disease, unspecified: Secondary | ICD-10-CM

## 2017-11-29 MED ORDER — GLYCOPYRROLATE-FORMOTEROL 9-4.8 MCG/ACT IN AERO: 2.0000 | INHALATION_SPRAY | Freq: Two times a day (BID) | RESPIRATORY_TRACT | 0 refills | Status: DC

## 2017-11-29 MED ORDER — AZITHROMYCIN 250 MG PO TABS: ORAL_TABLET | ORAL | 0 refills | Status: AC

## 2017-11-29 MED ORDER — FLUTICASONE FUROATE-VILANTEROL 100-25 MCG/INH IN AEPB: 1.0000 | INHALATION_SPRAY | Freq: Every day | RESPIRATORY_TRACT | 0 refills | Status: DC

## 2017-11-29 MED ORDER — GLYCOPYRROLATE-FORMOTEROL 9-4.8 MCG/ACT IN AERO: 2.0000 | INHALATION_SPRAY | Freq: Two times a day (BID) | RESPIRATORY_TRACT | 5 refills | Status: DC

## 2017-11-29 NOTE — Progress Notes (Signed)
@Patient  ID: Carl Lee, male    DOB: [birth year], 62 y.o.   MRN: [id number]    Referring provider: [name], MD  HPI: 62 yo male former smoker with a CABG in 2015 and pulmonary critical care medicine was consulted for a COPD exacerbation. July 2015 lung function testing showed a ratio 62%, FEV1 of 2.10 L (56% predicted), total lung capacity 6.35 L (80% predicted). He smoked for many years and quit smoking in 2015. April 2017 pulmonary function testing: FEV1 1.8 L, total lung capacity 99% predicted, DLCO 39% predicted 11/12/2015 TTE > normal LVEF, RV mildly dilated and systolic function mildly reduced OSA -BIPAP    11/29/2017 Follow up : Cough  Patient returns for a follow-up.  He was seen 2 weeks ago for a COPD exacerbation.  He was treated with a prednisone taper.  Patient had been on Bactrim for a UTI.  And was recommended to finish this course of antibiotics.  Patient says he is feeling better.  He was actually able to go out in do yard work over the last few days. However since stopping prednisone he feels that his breathing is not quite as good.  Still has some lingering cough and congestion.  He is leaving to go on vacation in 64 to make sure that he is completely clear.  Spirometry today shows FEV1 at 49%, ratio 56, FVC 66%-similar to 2017. He denies any hemoptysis, chest pain, orthopnea, edema.  Chest x-ray last visit showed COPD changes  Allergies  Allergen Reactions  . Codeine Other (See Comments)    Nightmares  . Doxycycline Hives and Other (See Comments)    mild    Immunization History  Administered Date(s) Administered  . Influenza,inj,Quad PF,6+ Mos 04/17/2013, 06/17/2014, 05/01/2015, 03/25/2016, 04/01/2017  . Pneumococcal Conjugate-13 05/01/2015  . Pneumococcal Polysaccharide-23 06/17/2014  . Tdap 12/14/2012  . Zoster 12/23/2015    Past Medical History:  Diagnosis Date  . AAA (abdominal aortic aneurysm) ([place]) 03/25/12   s/p endovascular repair  .  Adenomatous polyp 06/2009  . Atrial tachycardia, paroxysmal ([place]) 06/18/2016  . Bladder cancer ([place]) 08/16/2008   recurrence 2015 and 2018 treated with office fulguration  . CAD (coronary artery disease), native coronary artery    S/p 3v CABG 01/2014   . Childhood asthma    as a child  . Complication of anesthesia    "takes me a year to get over"  . Contact dermatitis    atypical [name])  . COPD (chronic obstructive pulmonary disease) ([place])   . Coronary artery disease    a.2015: CABG w/ LIMA-LAD, RIMA-OM, SVG-RCA b. Cath on 07/10/15 w/ patent LIMA-LAD and RIMA-OM. SVG-RCA occluded but collaterals present  . Diverticulosis 2013   severe by CT and colonoscopy  . Environmental allergies    improved as ages  . Fracture of lateral malleolus of left ankle 08/29/2013  . Headache    hasn't had migraines in years  . Hepatic steatosis 06/2015   by [place]  . Hx of migraines   . Hypertension   . Lumbosacral radiculopathy at S1 03/2012   left, with spinal stenosis (MRI 04/2013) improved with TF ESI L5/S1 and S1/2 ([facility])  . OSA (obstructive sleep apnea) 09/04/2014   Severe with AHI 35/hr, uses BIPAP  . Overweight (BMI 25.0-29.9) 01/26/2017  . Pre-diabetes   . Prediabetes 06/08/2014   CAD   . Resistance to clopidogrel 2015   drug metabolism panel run - asked to scan  . Spinal stenosis  LS1 nerve root impingement from bulging disc  . Vitamin B12 deficiency   . Zenker diverticulum     Tobacco History: Social History   Tobacco Use  Smoking Status Former Smoker  . Packs/day: 1.00  . Years: 40.00  . Pack years: 40.00  . Types: Cigarettes  . Last attempt to quit: 02/05/2014  . Years since quitting: 3.8  Smokeless Tobacco Never Used   Counseling given: Not Answered   Outpatient Encounter Medications as of 11/29/2017  Medication Sig  . acetaminophen (TYLENOL) 325 MG tablet Take 650 mg by mouth every 6 (six) hours as needed for moderate pain or headache.  . albuterol (PROAIR  HFA) 108 (90 Base) MCG/ACT inhaler Inhale 2 puffs into the lungs every 6 (six) hours as needed for wheezing or shortness of breath.  [name] amLODipine (NORVASC) 5 MG tablet Take 5 mg by mouth daily.  [name] aspirin EC 325 MG EC tablet Take 1 tablet (325 mg total) by mouth daily.  [name] atorvastatin (LIPITOR) 20 MG tablet TAKE 1 TABLET (20 MG TOTAL) BY MOUTH DAILY AT 6 PM.  . betamethasone dipropionate (DIPROLENE) 0.05 % ointment   . BREO ELLIPTA 100-25 MCG/INH AEPB INHALE 1 PUFF INTO THE LUNGS DAILY.  . famotidine (PEPCID) 20 MG tablet TAKE 1 TABLET (20 MG TOTAL) BY MOUTH 2 (TWO) TIMES DAILY.  . metoprolol tartrate (LOPRESSOR) 25 MG tablet TAKE 0.5 TABLETS (12.5 MG TOTAL) BY MOUTH 2 (TWO) TIMES DAILY.  [name] Propylene Glycol (SYSTANE BALANCE) 0.6 % SOLN Place 1 drop into both eyes daily as needed (for dry eyes).  [name] sulfamethoxazole-trimethoprim (BACTRIM,SEPTRA) 400-80 MG tablet Take 1 tablet by mouth 2 (two) times daily.  . tamsulosin (FLOMAX) 0.4 MG CAPS capsule Take 1 capsule by mouth daily.  . vitamin B-12 (CYANOCOBALAMIN) 500 MCG tablet Take 1 tablet (500 mcg total) by mouth daily. (Patient taking differently: Take 500 mcg by mouth every other day. )  . fluticasone furoate-vilanterol (BREO ELLIPTA) 100-25 MCG/INH AEPB Inhale 1 puff into the lungs daily.  [name] gabapentin (NEURONTIN) 300 MG capsule Take 1 capsule (300 mg total) by mouth 2 (two) times daily. First 3 days take once nightly (Patient not taking: Reported on 11/29/2017)  . [DISCONTINUED] predniSONE (DELTASONE) 10 MG tablet 40mg  for 2 day, 30mg  for 2 day, 20mg  for 2 days, 10mg  for 2days then stop. (Patient not taking: Reported on 11/29/2017)   No facility-administered encounter medications on file as of 11/29/2017.      Review of Systems  Constitutional:   No  weight loss, night sweats,  Fevers, chills,  +fatigue, or  lassitude.  HEENT:   No headaches,  Difficulty swallowing,  Tooth/dental problems, or  Sore throat,                No sneezing,  itching, ear ache, nasal congestion, post nasal drip,   CV:  No chest pain,  Orthopnea, PND, swelling in lower extremities, anasarca, dizziness, palpitations, syncope.   GI  No heartburn, indigestion, abdominal pain, nausea, vomiting, diarrhea, change in bowel habits, loss of appetite, bloody stools.   Resp:   No chest wall deformity  Skin: no rash or lesions.  GU: no dysuria, change in color of urine, no urgency or frequency.  No flank pain, no hematuria   MS:  No joint pain or swelling.  No decreased range of motion.  No back pain.    Physical Exam  BP 116/70 (BP Location: Left Arm, Cuff Size: Normal)   Pulse 83   Ht  5\' 9"  (1.753 m)   Wt 208 lb 6.4 oz (94.5 kg)   SpO2 95%   BMI 30.78 kg/m   GEN: A/Ox3; pleasant , NAD, obese   HEENT:  [place]/AT,  EACs-clear, TMs-wnl, NOSE-clear, THROAT-clear, no lesions, no postnasal drip or exudate noted.   NECK:  Supple w/ fair ROM; no JVD; normal carotid impulses w/o bruits; no thyromegaly or nodules palpated; no lymphadenopathy.    RESP decreased breath sounds in the bases . no accessory muscle use, no dullness to percussion  CARD:  RRR, no m/r/g, no peripheral edema, pulses intact, no cyanosis or clubbing.  GI:   Soft & nt; nml bowel sounds; no organomegaly or masses detected.   Musco: Warm bil, no deformities or joint swelling noted.   Neuro: alert, no focal deficits noted.    Skin: Warm, no lesions or rashes    Lab Results:  CBC    Component Value Date/Time   WBC 7.7 06/29/2017 0955   RBC 4.99 06/29/2017 0955   HGB 15.9 06/29/2017 0955   HGB 16.1 02/10/2017 1103   HCT 47.3 06/29/2017 0955   HCT 46.7 02/10/2017 1103   PLT 248.0 06/29/2017 0955   PLT 291 02/10/2017 1103   MCV 94.9 06/29/2017 0955   MCV 91 02/10/2017 1103   MCV 96 01/09/2014 1237   MCH 31.0 03/09/2017 0549   MCHC 33.7 06/29/2017 0955   RDW 14.1 06/29/2017 0955   RDW 13.6 02/10/2017 1103   RDW 13.5 01/09/2014 1237   LYMPHSABS 2.0 06/29/2017 0955    LYMPHSABS 2.6 02/10/2017 1103   MONOABS 0.7 06/29/2017 0955   EOSABS 0.3 06/29/2017 0955   EOSABS 0.4 02/10/2017 1103   BASOSABS 0.1 06/29/2017 0955   BASOSABS 0.1 02/10/2017 1103    BMET    Component Value Date/Time   NA 136 03/09/2017 0549   NA 143 02/08/2017 1031   NA 138 01/09/2014 1237   K 3.8 03/09/2017 0549   K 3.7 01/09/2014 1237   CL 101 03/09/2017 0549   CL 104 01/09/2014 1237   CO2 26 03/09/2017 0549   CO2 27 01/09/2014 1237   GLUCOSE 126 (H) 03/09/2017 0549   GLUCOSE 83 01/09/2014 1237   BUN 14 03/09/2017 0549   BUN 13 02/08/2017 1031   BUN 19 (H) 01/09/2014 1237   CREATININE 0.82 03/09/2017 0549   CREATININE 1.16 03/19/2016 1121   CALCIUM 8.5 (L) 03/09/2017 0549   CALCIUM 8.6 01/09/2014 1237   GFRNONAA >60 03/09/2017 0549   GFRNONAA >60 01/09/2014 1237   GFRAA >60 03/09/2017 0549   GFRAA >60 01/09/2014 1237    BNP No results found for: BNP  ProBNP No results found for: PROBNP  Imaging: Dg Chest 2 View  Result Date: 11/16/2017 CLINICAL DATA:  62 y/o  M; x-ray with nipple markers. EXAM: CHEST - 2 VIEW COMPARISON:  11/10/2017 chest radiograph FINDINGS: Stable normal cardiac silhouette given differences in technique. Post median sternotomy with wires in alignment. Aortic atherosclerosis with calcification. Nipple markers noted. Question nodular density at the left lung base corresponds to nipple marker. Clear lungs. No pleural effusion or pneumothorax. No acute osseous abnormality is evident. IMPRESSION: Nodular density at left lung base corresponds to nipple marker. No acute pulmonary process identified. Electronically Signed   By: Kristine Garbe M.D.   On: 11/16/2017 15:36   Dg Chest 2 View  Result Date: 11/10/2017 CLINICAL DATA:  Cough, shortness of breath, chest pain. EXAM: CHEST - 2 VIEW COMPARISON:  Radiographs of March 19, 2016.  FINDINGS: The heart size and mediastinal contours are within normal limits. Status post coronary artery bypass  graft. No pneumothorax or pleural effusion is noted. Right lung is clear. Nodular density seen projected over left lung base which may represent overlying nipple shadow. The visualized skeletal structures are unremarkable. IMPRESSION: Nodular density seen projected over the left lung base which may represent overlying nipple shadow; repeat radiograph with nipple markers is recommended to rule out pulmonary nodule or mass. No other significant abnormality seen in the chest. Electronically Signed   By: Marijo Conception, M.D.   On: 11/10/2017 16:41     Assessment & Plan:   No problem-specific Assessment & Plan notes found for this encounter.     Rexene Edison, NP 11/29/2017

## 2017-11-29 NOTE — Patient Instructions (Addendum)
Stop BREO  Try BEVESPI 2 puffs Twice daily   Mucinex DM Twice daily  As needed  Cough /congestion  Z-pack to have on hold if symptoms worsen with discolored mucus .  Follow up with Dr. [name] in 2-3 months and As needed.  Please contact office for sooner follow up if symptoms do not improve or worsen or seek emergency care

## 2017-11-30 DIAGNOSIS — C672 Malignant neoplasm of lateral wall of bladder: Secondary | ICD-10-CM | POA: Diagnosis not present

## 2017-11-30 DIAGNOSIS — N410 Acute prostatitis: Secondary | ICD-10-CM | POA: Diagnosis not present

## 2017-11-30 DIAGNOSIS — N3000 Acute cystitis without hematuria: Secondary | ICD-10-CM | POA: Diagnosis not present

## 2017-11-30 NOTE — Assessment & Plan Note (Signed)
Resolving COPD flare. We will change Breo to Laba/ lama combination to see if this helps with his symptom management. A Z-Pak to have on hold in case symptoms persist and develop with discolored mucus. Patient education on antibiotics  Plan  Patient Instructions  Stop BREO  Try BEVESPI 2 puffs Twice daily   Mucinex DM Twice daily  As needed  Cough /congestion  Z-pack to have on hold if symptoms worsen with discolored mucus .  Follow up with Dr. [name] in 2-3 months and As needed.  Please contact office for sooner follow up if symptoms do not improve or worsen or seek emergency care

## 2017-12-12 ENCOUNTER — Ambulatory Visit: Payer: BLUE CROSS/BLUE SHIELD | Admitting: Surgery

## 2017-12-19 ENCOUNTER — Ambulatory Visit (INDEPENDENT_AMBULATORY_CARE_PROVIDER_SITE_OTHER): Payer: PPO | Admitting: Orthopaedic Surgery

## 2017-12-19 ENCOUNTER — Telehealth: Payer: Self-pay | Admitting: Pulmonary Disease

## 2017-12-19 ENCOUNTER — Ambulatory Visit (INDEPENDENT_AMBULATORY_CARE_PROVIDER_SITE_OTHER): Payer: PPO | Admitting: Physician Assistant

## 2017-12-19 NOTE — Telephone Encounter (Signed)
Left message for patient to call back. Reviewed patient's chart, it appears that he was on Anoro.

## 2017-12-20 MED ORDER — UMECLIDINIUM-VILANTEROL 62.5-25 MCG/INH IN AEPB: 1.0000 | INHALATION_SPRAY | Freq: Every day | RESPIRATORY_TRACT | 1 refills | Status: DC

## 2017-12-20 NOTE — Telephone Encounter (Signed)
Take Anoro Take Zpack

## 2017-12-20 NOTE — Telephone Encounter (Signed)
Spoke with the pt's spouse and notified of recs per BQ She verbalized understanding and will inform the pt  Rx for anoro sent

## 2017-12-20 NOTE — Telephone Encounter (Signed)
Spoke with pt, states that his Charolotte Eke is no longer covered by his insurance, but Anoro is.  Pt would like to be switched back to [employer] for insurance purposes. While on the phone, pt also noted increased sob and nonprod cough.  Pt was prescribed a zpak at 5/21 OV with TP to hold onto, wants to know if he can take this zpak now to help with these symptoms.  Pharmacy: [facility] [address] (90 day supply)  BQ please advise on zpak and Anoro switch.  Thanks!

## 2017-12-20 NOTE — Telephone Encounter (Signed)
Pt is calling back [phone]

## 2017-12-22 DIAGNOSIS — C672 Malignant neoplasm of lateral wall of bladder: Secondary | ICD-10-CM | POA: Diagnosis not present

## 2017-12-22 DIAGNOSIS — N410 Acute prostatitis: Secondary | ICD-10-CM | POA: Diagnosis not present

## 2017-12-26 ENCOUNTER — Ambulatory Visit
Admission: RE | Admit: 2017-12-26 | Discharge: 2017-12-26 | Disposition: A | Discharge status: Home / Self Care | Payer: PPO | Source: Ambulatory Visit | Attending: Surgery | Admitting: Surgery

## 2017-12-26 ENCOUNTER — Encounter: Payer: Self-pay | Admitting: Surgery

## 2017-12-26 ENCOUNTER — Ambulatory Visit: Payer: PPO | Admitting: Surgery

## 2017-12-26 ENCOUNTER — Other Ambulatory Visit: Payer: Self-pay

## 2017-12-26 VITALS — BP 120/84 | HR 55 | Temp 98.1°F | Resp 16 | Ht 69.0 in | Wt 207.0 lb

## 2017-12-26 DIAGNOSIS — I714 Abdominal aortic aneurysm, without rupture, unspecified: Secondary | ICD-10-CM

## 2017-12-26 MED ORDER — IOPAMIDOL (ISOVUE-370) INJECTION 76%
75.0000 mL | Freq: Once | INTRAVENOUS | Status: AC | PRN
  Administered 2017-12-26: 75 mL via INTRAVENOUS

## 2017-12-26 NOTE — Progress Notes (Signed)
Vascular and Vein Specialist of [facility]  Patient name: Carl Lee MRN: [id number] DOB: 1956-07-03 Sex: male   REASON FOR VISIT:    Follow up  HISOTRY OF PRESENT ILLNESS:    Carl Lee is a 62 y.o. male who is status post endovascular repair of a symptomatic abdominal aortic aneurysm on 03/25/2012 using a Cook Zenith device.  He has been difficult to evaluate with ultrasound and so CT scan follow-up has been recommended.  His preoperative CABG Doppler study showed no significant carotid stenosis.  He has recently had his left hip replaced.  He also suffers from spinal stenosis.  He takes a statin for hypercholesterolemia.   PAST MEDICAL HISTORY:   Past Medical History:  Diagnosis Date  . AAA (abdominal aortic aneurysm) ([place]) 03/25/12   s/p endovascular repair  . Adenomatous polyp 06/2009  . Atrial tachycardia, paroxysmal ([place]) 06/18/2016  . Bladder cancer ([place]) 08/16/2008   recurrence 2015 and 2018 treated with office fulguration  . CAD (coronary artery disease), native coronary artery    S/p 3v CABG 01/2014   . Childhood asthma    as a child  . Complication of anesthesia    "takes me a year to get over"  . Contact dermatitis    atypical [name])  . COPD (chronic obstructive pulmonary disease) ([place])   . Coronary artery disease    a.2015: CABG w/ LIMA-LAD, RIMA-OM, SVG-RCA b. Cath on 07/10/15 w/ patent LIMA-LAD and RIMA-OM. SVG-RCA occluded but collaterals present  . Diverticulosis 2013   severe by CT and colonoscopy  . Environmental allergies    improved as ages  . Fracture of lateral malleolus of left ankle 08/29/2013  . Headache    hasn't had migraines in years  . Hepatic steatosis 06/2015   by [place]  . Hx of migraines   . Hypertension   . Lumbosacral radiculopathy at S1 03/2012   left, with spinal stenosis (MRI 04/2013) improved with TF ESI L5/S1 and S1/2 ([place])  . OSA (obstructive sleep apnea) 09/04/2014   Severe with  AHI 35/hr, uses BIPAP  . Overweight (BMI 25.0-29.9) 01/26/2017  . Pre-diabetes   . Prediabetes 06/08/2014   CAD   . Resistance to clopidogrel 2015   drug metabolism panel run - asked to scan  . Spinal stenosis    LS1 nerve root impingement from bulging disc  . Vitamin B12 deficiency   . Zenker diverticulum      FAMILY HISTORY:   Family History  Problem Relation Age of Onset  . Diabetes Mother   . Arrhythmia Mother        pacemaker  . Cancer Mother        Bladder  . COPD Mother   . Thyroid disease Mother   . Hyperlipidemia Mother   . Hypertension Mother   . Diabetes Father   . CAD Father 76       CHF, MI  . Dementia Father   . Heart attack Father   . Diabetes Sister   . Heart attack Maternal Grandmother   . AAA (abdominal aortic aneurysm) Maternal Grandmother   . Colon cancer Neg Hx     SOCIAL HISTORY:   Social History   Tobacco Use  . Smoking status: Former Smoker    Packs/day: 1.00    Years: 40.00    Pack years: 40.00    Types: Cigarettes    Last attempt to quit: 02/05/2014    Years since quitting: 3.8  . Smokeless tobacco: Never  Used  Substance Use Topics  . Alcohol use: Yes    Alcohol/week: 0.0 oz    Comment: Rare- states 1 oz. per week     ALLERGIES:   Allergies  Allergen Reactions  . Codeine Other (See Comments)    Nightmares  . Doxycycline Hives and Other (See Comments)    mild     CURRENT MEDICATIONS:   Current Outpatient Medications  Medication Sig Dispense Refill  . acetaminophen (TYLENOL) 325 MG tablet Take 650 mg by mouth every 6 (six) hours as needed for moderate pain or headache.    . albuterol (PROAIR HFA) 108 (90 Base) MCG/ACT inhaler Inhale 2 puffs into the lungs every 6 (six) hours as needed for wheezing or shortness of breath. 1 Inhaler 2  . amLODipine (NORVASC) 5 MG tablet Take 5 mg by mouth daily.    [name] aspirin EC 325 MG EC tablet Take 1 tablet (325 mg total) by mouth daily.    [name] atorvastatin (LIPITOR) 20 MG tablet TAKE 1  TABLET (20 MG TOTAL) BY MOUTH DAILY AT 6 PM. 90 tablet 2  . betamethasone dipropionate (DIPROLENE) 0.05 % ointment     . famotidine (PEPCID) 20 MG tablet TAKE 1 TABLET (20 MG TOTAL) BY MOUTH 2 (TWO) TIMES DAILY. 180 tablet 3  . metoprolol tartrate (LOPRESSOR) 25 MG tablet TAKE 0.5 TABLETS (12.5 MG TOTAL) BY MOUTH 2 (TWO) TIMES DAILY. 90 tablet 2  . Propylene Glycol (SYSTANE BALANCE) 0.6 % SOLN Place 1 drop into both eyes daily as needed (for dry eyes).    . tamsulosin (FLOMAX) 0.4 MG CAPS capsule Take 1 capsule by mouth daily.    [name] umeclidinium-vilanterol (ANORO ELLIPTA) 62.5-25 MCG/INH AEPB Inhale 1 puff into the lungs daily. 180 each 1  . vitamin B-12 (CYANOCOBALAMIN) 500 MCG tablet Take 1 tablet (500 mcg total) by mouth daily. (Patient taking differently: Take 500 mcg by mouth every other day. )     No current facility-administered medications for this visit.     REVIEW OF SYSTEMS:   [X]  denotes positive finding, [ ]  denotes negative finding Cardiac  Comments:  Chest pain or chest pressure:    Shortness of breath upon exertion: x   Short of breath when lying flat:    Irregular heart rhythm:        Vascular    Pain in calf, thigh, or hip brought on by ambulation: x   Pain in feet at night that wakes you up from your sleep:     Blood clot in your veins:    Leg swelling:         Pulmonary    Oxygen at home:    Productive cough:  x   Wheezing:  x       Neurologic    Sudden weakness in arms or legs:     Sudden numbness in arms or legs:     Sudden onset of difficulty speaking or slurred speech:    Temporary loss of vision in one eye:     Problems with dizziness:         Gastrointestinal    Blood in stool:     Vomited blood:         Genitourinary    Burning when urinating:  x   Blood in urine: x       Psychiatric    Major depression:         Hematologic    Bleeding problems:    Problems with blood clotting  too easily:        Skin    Rashes or ulcers: x         Constitutional    Fever or chills: x     PHYSICAL EXAM:   Vitals:   12/26/17 1110  BP: 120/84  Pulse: (!) 55  Resp: 16  Temp: 98.1 F (36.7 C)  TempSrc: Oral  SpO2: 95%  Weight: 207 lb (93.9 kg)  Height: 5\' 9"  (1.753 m)    GENERAL: The patient is a well-nourished male, in no acute distress. The vital signs are documented above. CARDIAC: There is a regular rate and rhythm.  VASCULAR: No carotid bruits PULMONARY: Non-labored respirations ABDOMEN: Soft and non-tender no pulsatile mass MUSCULOSKELETAL: There are no major deformities or cyanosis. NEUROLOGIC: No focal weakness or paresthesias are detected. SKIN: There are no ulcers or rashes noted. PSYCHIATRIC: The patient has a normal affect.  STUDIES:   I have reviewed his CT scan which shows complete exclusion of the aneurysm sac with no evidence of endoleak. Redemonstration of endovascular repair of infrarenal abdominal aortic aneurysm, with either suprarenal fixation with a Cook device or a cuff. Patency is maintained of 2 right renal arteries, left renal artery, and there has been excellent remodeling of the aorta with no type 1 or type 2 endoleak.  Mild luminal mural thrombus within the main body of the device without significant stenosis.  No acute finding within the abdomen/pelvis.  Redemonstration of small left ventricular apical pseudoaneurysm, which appears essentially unchanged dating to the CT of 03/24/2012, potentially sequela of a prior myocardial infarction. MEDICAL ISSUES:   AAA: The patient will follow-up in 1 year with an abdominal ultrasound.    Annamarie Major, MD [facility] Specialists of [facility] [phone] Pager [phone]

## 2017-12-28 ENCOUNTER — Other Ambulatory Visit (INDEPENDENT_AMBULATORY_CARE_PROVIDER_SITE_OTHER): Payer: PPO

## 2017-12-28 ENCOUNTER — Other Ambulatory Visit: Payer: Self-pay | Admitting: Family Medicine

## 2017-12-28 DIAGNOSIS — E785 Hyperlipidemia, unspecified: Secondary | ICD-10-CM

## 2017-12-28 DIAGNOSIS — E538 Deficiency of other specified B group vitamins: Secondary | ICD-10-CM

## 2017-12-28 DIAGNOSIS — R7303 Prediabetes: Secondary | ICD-10-CM

## 2017-12-28 LAB — BASIC METABOLIC PANEL
BUN: 19 mg/dL (ref 6–23)
CO2: 27 mEq/L (ref 19–32)
Calcium: 9.7 mg/dL (ref 8.4–10.5)
Chloride: 103 mEq/L (ref 96–112)
Creatinine, Ser: 0.77 mg/dL (ref 0.40–1.50)
GFR: 108.19 mL/min (ref >60.00)
Glucose, Bld: 117 mg/dL — ABNORMAL HIGH (ref 70–99)
POTASSIUM: 4 meq/L (ref 3.5–5.1)
SODIUM: 138 meq/L (ref 135–145)

## 2017-12-28 LAB — LDL CHOLESTEROL, DIRECT: LDL DIRECT: 62 mg/dL

## 2017-12-28 LAB — HEMOGLOBIN A1C: HEMOGLOBIN A1C: 6.3 % (ref 4.6–6.5)

## 2017-12-28 LAB — VITAMIN B12: VITAMIN B 12: 644 pg/mL (ref 211–911)

## 2017-12-29 ENCOUNTER — Encounter: Payer: Self-pay | Admitting: Family Medicine

## 2017-12-29 ENCOUNTER — Ambulatory Visit (INDEPENDENT_AMBULATORY_CARE_PROVIDER_SITE_OTHER): Payer: PPO | Admitting: Family Medicine

## 2017-12-29 VITALS — BP 118/64 | HR 77 | Temp 97.9°F | Ht 70.0 in | Wt 208.5 lb

## 2017-12-29 DIAGNOSIS — H9190 Unspecified hearing loss, unspecified ear: Secondary | ICD-10-CM | POA: Insufficient documentation

## 2017-12-29 DIAGNOSIS — G8929 Other chronic pain: Secondary | ICD-10-CM | POA: Insufficient documentation

## 2017-12-29 DIAGNOSIS — E785 Hyperlipidemia, unspecified: Secondary | ICD-10-CM | POA: Diagnosis not present

## 2017-12-29 DIAGNOSIS — R7303 Prediabetes: Secondary | ICD-10-CM | POA: Diagnosis not present

## 2017-12-29 DIAGNOSIS — K225 Diverticulum of esophagus, acquired: Secondary | ICD-10-CM | POA: Diagnosis not present

## 2017-12-29 DIAGNOSIS — J449 Chronic obstructive pulmonary disease, unspecified: Secondary | ICD-10-CM | POA: Diagnosis not present

## 2017-12-29 DIAGNOSIS — I25810 Atherosclerosis of coronary artery bypass graft(s) without angina pectoris: Secondary | ICD-10-CM | POA: Diagnosis not present

## 2017-12-29 DIAGNOSIS — M79673 Pain in unspecified foot: Secondary | ICD-10-CM | POA: Diagnosis not present

## 2017-12-29 DIAGNOSIS — E538 Deficiency of other specified B group vitamins: Secondary | ICD-10-CM

## 2017-12-29 DIAGNOSIS — Z95828 Presence of other vascular implants and grafts: Secondary | ICD-10-CM | POA: Diagnosis not present

## 2017-12-29 DIAGNOSIS — H9193 Unspecified hearing loss, bilateral: Secondary | ICD-10-CM

## 2017-12-29 MED ORDER — VITAMIN B-12 500 MCG PO TABS
500.0000 ug | ORAL_TABLET | ORAL | Status: DC
Start: 2017-12-29 — End: 2019-07-23

## 2017-12-29 NOTE — Assessment & Plan Note (Signed)
Ongoing atypical chest pain. Did not tolerate imdur due to headache and poor blood pressure control, now back on amlodipine.

## 2017-12-29 NOTE — Assessment & Plan Note (Signed)
Chronic, stable. Continue lipitor.  The ASCVD Risk score (Goff DC Jr., et al., 2013) failed to calculate for the following reasons:   The valid total cholesterol range is 130 to 320 mg/dL  

## 2017-12-29 NOTE — Assessment & Plan Note (Signed)
Stable s/p surgical removal.

## 2017-12-29 NOTE — Progress Notes (Signed)
BP 118/64 (BP Location: Left Arm, Patient Position: Sitting, Cuff Size: Normal)   Pulse 77   Temp 97.9 F (36.6 C) (Oral)   Ht 5\' 10"  (1.778 m)   Wt 208 lb 8 oz (94.6 kg)   SpO2 94%   BMI 29.92 kg/m    CC: 6 mo f/u visit Subjective:    Patient ID: Carl Lee, male    DOB: 01/16/1956, 62 y.o.   MRN: [id number]  HPI: Carl Lee is a 62 y.o. male presenting on 12/29/2017 for 6 mo follow up   Had zencker diverticulum fixed (diverticulotomy) - this has resolved cough, dysphagia (Dr Okey Dupre @ [facility])  Recently saw [facility] Dr [name] - stable eval planned re yearly eval. COPD followed by pulm - on anoro (Bevespi was not covered by insurance).  CAD s/p CABG - saw cardiology last month - norvasc was replaced with imdur. This caused headache and fluctuating blood pressures - had to change back to amlodipine 5mg .   Urology - bad UTI after cystoscopy - hematuria, fever/chills. Treated with levaquin antibiotic. Had UA rechecked, now feeling better.   Would like referral to audiologist for known hearing loss.   Ongoing bilateral foot pain worse with prolonged walking worse at soles, ongoing for years but acutely worsening. Has tried insoles as well as heel inserts. Not worse with first few steps of the day. No numbness or burning pain.   Relevant past medical, surgical, family and social history reviewed and updated as indicated. Interim medical history since our last visit reviewed. Allergies and medications reviewed and updated. Outpatient Medications Prior to Visit  Medication Sig Dispense Refill  . acetaminophen (TYLENOL) 325 MG tablet Take 650 mg by mouth every 6 (six) hours as needed for moderate pain or headache.    . albuterol (PROAIR HFA) 108 (90 Base) MCG/ACT inhaler Inhale 2 puffs into the lungs every 6 (six) hours as needed for wheezing or shortness of breath. 1 Inhaler 2  . amLODipine (NORVASC) 5 MG tablet Take 5 mg by mouth daily.    [name] aspirin EC 325 MG EC tablet Take 1  tablet (325 mg total) by mouth daily.    [name] atorvastatin (LIPITOR) 20 MG tablet TAKE 1 TABLET (20 MG TOTAL) BY MOUTH DAILY AT 6 PM. 90 tablet 2  . betamethasone dipropionate (DIPROLENE) 0.05 % ointment     . famotidine (PEPCID) 20 MG tablet TAKE 1 TABLET (20 MG TOTAL) BY MOUTH 2 (TWO) TIMES DAILY. 180 tablet 3  . metoprolol tartrate (LOPRESSOR) 25 MG tablet TAKE 0.5 TABLETS (12.5 MG TOTAL) BY MOUTH 2 (TWO) TIMES DAILY. 90 tablet 2  . Propylene Glycol (SYSTANE BALANCE) 0.6 % SOLN Place 1 drop into both eyes daily as needed (for dry eyes).    . tamsulosin (FLOMAX) 0.4 MG CAPS capsule Take 1 capsule by mouth daily.    [name] umeclidinium-vilanterol (ANORO ELLIPTA) 62.5-25 MCG/INH AEPB Inhale 1 puff into the lungs daily. 180 each 1  . vitamin B-12 (CYANOCOBALAMIN) 500 MCG tablet Take 1 tablet (500 mcg total) by mouth daily. (Patient taking differently: Take 500 mcg by mouth every other day. )     No facility-administered medications prior to visit.      Per HPI unless specifically indicated in ROS section below Review of Systems     Objective:    BP 118/64 (BP Location: Left Arm, Patient Position: Sitting, Cuff Size: Normal)   Pulse 77   Temp 97.9 F (36.6 C) (Oral)   Ht  5\' 10"  (1.778 m)   Wt 208 lb 8 oz (94.6 kg)   SpO2 94%   BMI 29.92 kg/m   Wt Readings from Last 3 Encounters:  12/29/17 208 lb 8 oz (94.6 kg)  12/26/17 207 lb (93.9 kg)  11/29/17 208 lb 6.4 oz (94.5 kg)    Physical Exam  Constitutional: He appears well-developed and well-nourished. No distress.  HENT:  Mouth/Throat: Oropharynx is clear and moist. No oropharyngeal exudate.  Eyes: Pupils are equal, round, and reactive to light. EOM are normal.  Neck: Normal range of motion. Neck supple. No thyromegaly present.  Cardiovascular: Normal rate, regular rhythm and normal heart sounds.  No murmur heard. Pulmonary/Chest: Effort normal. No respiratory distress. He has wheezes (faint left lung). He has no rales.  Musculoskeletal:  He exhibits no edema.  2+ DP bilaterally Mild tenderness to palpation at bilateral soles, no pain with toe extension.   Lymphadenopathy:    He has no cervical adenopathy.  Nursing note and vitals reviewed.  Results for orders placed or performed in visit on 12/28/17  LDL Cholesterol, Direct  Result Value Ref Range   Direct LDL 62.0 mg/dL  Vitamin B12  Result Value Ref Range   Vitamin B-12 644 211 - 911 pg/mL  Hemoglobin A1c  Result Value Ref Range   Hgb A1c MFr Bld 6.3 4.6 - 6.5 %  Basic metabolic panel  Result Value Ref Range   Sodium 138 135 - 145 mEq/L   Potassium 4.0 3.5 - 5.1 mEq/L   Chloride 103 96 - 112 mEq/L   CO2 27 19 - 32 mEq/L   Glucose, Bld 117 (H) 70 - 99 mg/dL   BUN 19 6 - 23 mg/dL   Creatinine, Ser 0.77 0.40 - 1.50 mg/dL   Calcium 9.7 8.4 - 10.5 mg/dL   GFR 108.19 >60.00 mL/min      Assessment & Plan:   Problem List Items Addressed This Visit    Zenker diverticulum    Stable s/p surgical removal.       Vitamin B12 deficiency    B12 stable on current regimen - continue.       Prediabetes    Chronic, reviewed diet choices to control sugar levels.       History of repair of aneurysm of abdominal aorta using endovascular stent graft    Stable period, sees [facility] yearly      Dyslipidemia    Chronic, stable. Continue lipitor.  The ASCVD Risk score Mikey Bussing DC Jr., et al., 2013) failed to calculate for the following reasons:   The valid total cholesterol range is 130 to 320 mg/dL       Coronary artery disease    Ongoing atypical chest pain. Did not tolerate imdur due to headache and poor blood pressure control, now back on amlodipine.       COPD, moderate ([place])    bevespi no longer covered by insurance, now on anoro and followed by pulm.      Chronic foot pain    Ongoing, ?plantar fasciitis. Requests podiatry evaluation - referral placed.      Relevant Orders   Ambulatory referral to Podiatry   Bilateral hearing loss - Primary    Known hearing  loss. Previously hesitant for audiology eval given const of hearing aides, requests audiology referral today for assessment.       Relevant Orders   Ambulatory referral to Audiology       Meds ordered this encounter  Medications  . vitamin B-12 (  CYANOCOBALAMIN) 500 MCG tablet    Sig: Take 1 tablet (500 mcg total) by mouth every other day.   Orders Placed This Encounter  Procedures  . Ambulatory referral to Audiology    Referral Priority:   Routine    Referral Type:   Audiology Exam    Referral Reason:   Specialty Services Required    Number of Visits Requested:   1  . Ambulatory referral to Podiatry    Referral Priority:   Routine    Referral Type:   Consultation    Referral Reason:   Specialty Services Required    Requested Specialty:   Podiatry    Number of Visits Requested:   1    Follow up plan: Return in about 6 months (around 06/30/2018) for annual exam, prior fasting for blood work, medicare wellness visit.  [name], MD

## 2017-12-29 NOTE — Assessment & Plan Note (Signed)
B12 stable on current regimen - continue.

## 2017-12-29 NOTE — Assessment & Plan Note (Signed)
Known hearing loss. Previously hesitant for audiology eval given const of hearing aides, requests audiology referral today for assessment.

## 2017-12-29 NOTE — Assessment & Plan Note (Signed)
Stable period, sees [facility] yearly

## 2017-12-29 NOTE — Assessment & Plan Note (Signed)
Chronic, reviewed diet choices to control sugar levels.

## 2017-12-29 NOTE — Addendum Note (Signed)
Addended by: [name] on: 12/29/2017 11:06 PM   Modules accepted: Level of Service

## 2017-12-29 NOTE — Assessment & Plan Note (Signed)
bevespi no longer covered by insurance, now on anoro and followed by pulm.

## 2017-12-29 NOTE — Patient Instructions (Signed)
Sugars are staying a bit elevated in prediabetes range - watch simple carbs especially portion sizes.  Good to see you today. Return in 6 months for physical.

## 2017-12-29 NOTE — Assessment & Plan Note (Signed)
Ongoing, ?plantar fasciitis. Requests podiatry evaluation - referral placed.

## 2018-01-03 ENCOUNTER — Encounter: Payer: Self-pay | Admitting: Adult Health

## 2018-01-03 ENCOUNTER — Telehealth: Payer: Self-pay | Admitting: Pulmonary Disease

## 2018-01-03 ENCOUNTER — Ambulatory Visit: Payer: PPO | Admitting: Adult Health

## 2018-01-03 DIAGNOSIS — J449 Chronic obstructive pulmonary disease, unspecified: Secondary | ICD-10-CM

## 2018-01-03 NOTE — Assessment & Plan Note (Signed)
Recent flare now resolving.  Plan  Patient Instructions  Continue on ANORO daily, rinse after use.  Mucinex DM Twice daily  As needed  Cough /congestion  Follow up with Dr. [name] in 3 months and As needed. Please contact office for sooner follow up if symptoms do not improve or worsen or seek emergency care

## 2018-01-03 NOTE — Patient Instructions (Addendum)
Continue on ANORO daily, rinse after use.  Mucinex DM Twice daily  As needed  Cough /congestion  Follow up with Dr. [name] in 3 months and As needed. Please contact office for sooner follow up if symptoms do not improve or worsen or seek emergency care

## 2018-01-03 NOTE — Telephone Encounter (Signed)
Spoke with patient. He stated that he was seen by his PCP on Friday of last visit. Per patient, PCP still heard some wheezing in his lungs despite the fact he was finishing up his zpak. PCP recommended that he follow up with pulmonary.   Patient has been scheduled with TP for 4pm today. He verbalized understanding. Nothing else needed at time of call.

## 2018-01-03 NOTE — Progress Notes (Signed)
@Patient  ID: Carl Lee, male    DOB: 1955-07-29, 62 y.o.   MRN: [id number]  Chief Complaint  Patient presents with  . Follow-up    COPD    Referring provider: [name], MD  HPI: 62 yo male former smoker with a CABG in 2015 and pulmonary critical care medicine was consulted for a COPD exacerbation. July 2015 lung function testing showed a ratio 62%, FEV1 of 2.10 L (56% predicted), total lung capacity 6.35 L (80% predicted). He smoked for many years and quit smoking in 2015. April 2017 pulmonary function testing: FEV1 1.8 L, total lung capacity 99% predicted, DLCO 39% predicted 11/12/2015 TTE > normal LVEF, RV mildly dilated and systolic function mildly reduced OSA -BIPAP  11/2017 Spirometry today shows FEV1 at 49%, ratio 56, FVC 66%-similar to 2017.   01/03/2018 Follow up ; COPD  Patient presents for a follow up for COPD .  Patient had been having slow to resolve COPD flare.  He was given a course of prednisone a Z-Pak and change from Breo to [employer].  Says he is feeling better.  Was able to go on vacation and had a good time.  He does get short of breath with activity at times.  However is very active does his own yard work including push mowing his grass and using a weed eater.  Patient was seen his primary care physician's office last week during that visit he had a some occasional wheezing.  Patient says he did not feel like it was that bad.  But was recommend to follow-up with our office.  Patient says over the last 3 days has been doing well.  Was able to do his yard work this morning without difficulty.  He denies any fever discolored mucus orthopnea PND or leg swelling.     Allergies  Allergen Reactions  . Codeine Other (See Comments)    Nightmares  . Doxycycline Hives and Other (See Comments)    mild    Immunization History  Administered Date(s) Administered  . Influenza,inj,Quad PF,6+ Mos 04/17/2013, 06/17/2014, 05/01/2015, 03/25/2016, 04/01/2017  . Pneumococcal  Conjugate-13 05/01/2015  . Pneumococcal Polysaccharide-23 06/17/2014  . Tdap 12/14/2012  . Zoster 12/23/2015    Past Medical History:  Diagnosis Date  . AAA (abdominal aortic aneurysm) ([place]) 03/25/12   s/p endovascular repair  . Adenomatous polyp 06/2009  . Atrial tachycardia, paroxysmal ([place]) 06/18/2016  . Bladder cancer ([place]) 08/16/2008   recurrence 2015 and 2018 treated with office fulguration  . CAD (coronary artery disease), native coronary artery    S/p 3v CABG 01/2014   . Childhood asthma    as a child  . Complication of anesthesia    "takes me a year to get over"  . Contact dermatitis    atypical [name])  . COPD (chronic obstructive pulmonary disease) ([place])   . Coronary artery disease    a.2015: CABG w/ LIMA-LAD, RIMA-OM, SVG-RCA b. Cath on 07/10/15 w/ patent LIMA-LAD and RIMA-OM. SVG-RCA occluded but collaterals present  . Diverticulosis 2013   severe by CT and colonoscopy  . Environmental allergies    improved as ages  . Fracture of lateral malleolus of left ankle 08/29/2013  . Headache    hasn't had migraines in years  . Hepatic steatosis 06/2015   by [place]  . Hx of migraines   . Hypertension   . Lumbosacral radiculopathy at S1 03/2012   left, with spinal stenosis (MRI 04/2013) improved with TF ESI L5/S1 and S1/2 ([facility] [place])  .  OSA (obstructive sleep apnea) 09/04/2014   Severe with AHI 35/hr, uses BIPAP  . Overweight (BMI 25.0-29.9) 01/26/2017  . Pre-diabetes   . Prediabetes 06/08/2014   CAD   . Resistance to clopidogrel 2015   drug metabolism panel run - asked to scan  . Spinal stenosis    LS1 nerve root impingement from bulging disc  . Vitamin B12 deficiency   . Zenker diverticulum     Tobacco History: Social History   Tobacco Use  Smoking Status Former Smoker  . Packs/day: 1.00  . Years: 40.00  . Pack years: 40.00  . Types: Cigarettes  . Last attempt to quit: 02/05/2014  . Years since quitting: 3.9  Smokeless Tobacco Never Used   Counseling  given: Not Answered   Outpatient Encounter Medications as of 01/03/2018  Medication Sig  . acetaminophen (TYLENOL) 325 MG tablet Take 650 mg by mouth every 6 (six) hours as needed for moderate pain or headache.  . albuterol (PROAIR HFA) 108 (90 Base) MCG/ACT inhaler Inhale 2 puffs into the lungs every 6 (six) hours as needed for wheezing or shortness of breath.  [name] amLODipine (NORVASC) 5 MG tablet Take 5 mg by mouth daily.  [name] aspirin EC 325 MG EC tablet Take 1 tablet (325 mg total) by mouth daily.  [name] atorvastatin (LIPITOR) 20 MG tablet TAKE 1 TABLET (20 MG TOTAL) BY MOUTH DAILY AT 6 PM.  . betamethasone dipropionate (DIPROLENE) 0.05 % ointment   . famotidine (PEPCID) 20 MG tablet TAKE 1 TABLET (20 MG TOTAL) BY MOUTH 2 (TWO) TIMES DAILY.  . metoprolol tartrate (LOPRESSOR) 25 MG tablet TAKE 0.5 TABLETS (12.5 MG TOTAL) BY MOUTH 2 (TWO) TIMES DAILY.  [name] Propylene Glycol (SYSTANE BALANCE) 0.6 % SOLN Place 1 drop into both eyes daily as needed (for dry eyes).  . tamsulosin (FLOMAX) 0.4 MG CAPS capsule Take 1 capsule by mouth daily.  [name] umeclidinium-vilanterol (ANORO ELLIPTA) 62.5-25 MCG/INH AEPB Inhale 1 puff into the lungs daily.  . vitamin B-12 (CYANOCOBALAMIN) 500 MCG tablet Take 1 tablet (500 mcg total) by mouth every other day.   No facility-administered encounter medications on file as of 01/03/2018.      Review of Systems  Constitutional:   No  weight loss, night sweats,  Fevers, chills, fatigue, or  lassitude.  HEENT:   No headaches,  Difficulty swallowing,  Tooth/dental problems, or  Sore throat,                No sneezing, itching, ear ache, nasal congestion, post nasal drip,   CV:  No chest pain,  Orthopnea, PND, swelling in lower extremities, anasarca, dizziness, palpitations, syncope.   GI  No heartburn, indigestion, abdominal pain, nausea, vomiting, diarrhea, change in bowel habits, loss of appetite, bloody stools.   Resp:  .  No wheezing.  No chest wall deformity  Skin: no rash  or lesions.  GU: no dysuria, change in color of urine, no urgency or frequency.  No flank pain, no hematuria   MS:  No joint pain or swelling.  No decreased range of motion.  No back pain.    Physical Exam  BP 126/74 (BP Location: Left Arm, Cuff Size: Normal)   Pulse 75   Ht 5\' 8"  (1.727 m)   Wt 208 lb (94.3 kg)   SpO2 97%   BMI 31.63 kg/m   GEN: A/Ox3; pleasant , NAD, elderly    HEENT:  [place]/AT,  EACs-clear, TMs-wnl, NOSE-clear, THROAT-clear, no lesions, no postnasal drip or exudate  noted.   NECK:  Supple w/ fair ROM; no JVD; normal carotid impulses w/o bruits; no thyromegaly or nodules palpated; no lymphadenopathy.    RESP  Clear  P & A; w/o, wheezes/ rales/ or rhonchi. no accessory muscle use, no dullness to percussion  CARD:  RRR, no m/r/g, no peripheral edema, pulses intact, no cyanosis or clubbing.  GI:   Soft & nt; nml bowel sounds; no organomegaly or masses detected.   Musco: Warm bil, no deformities or joint swelling noted.   Neuro: alert, no focal deficits noted.    Skin: Warm, no lesions or rashes    Lab Results:  CBC   BMET  BNP No results found for: BNP  ProBNP No results found for: PROBNP  Imaging:   Assessment & Plan:   COPD, moderate (HCC) Recent flare now resolving.  Plan  Patient Instructions  Continue on ANORO daily, rinse after use.  Mucinex DM Twice daily  As needed  Cough /congestion  Follow up with Dr. [name] in 3 months and As needed. Please contact office for sooner follow up if symptoms do not improve or worsen or seek emergency care           Rexene Edison, NP 01/03/2018

## 2018-01-04 NOTE — Progress Notes (Signed)
Reviewed, agree 

## 2018-01-09 NOTE — Progress Notes (Signed)
Reviewed, agree 

## 2018-01-10 DIAGNOSIS — G4733 Obstructive sleep apnea (adult) (pediatric): Secondary | ICD-10-CM | POA: Diagnosis not present

## 2018-01-16 DIAGNOSIS — N3000 Acute cystitis without hematuria: Secondary | ICD-10-CM | POA: Diagnosis not present

## 2018-01-20 ENCOUNTER — Ambulatory Visit: Payer: PPO | Admitting: Podiatry

## 2018-01-23 ENCOUNTER — Other Ambulatory Visit: Payer: Self-pay | Admitting: Cardiovascular Disease

## 2018-01-24 ENCOUNTER — Ambulatory Visit: Payer: PPO | Admitting: Podiatry

## 2018-01-24 ENCOUNTER — Encounter: Payer: Self-pay | Admitting: Podiatry

## 2018-01-24 ENCOUNTER — Ambulatory Visit (INDEPENDENT_AMBULATORY_CARE_PROVIDER_SITE_OTHER): Payer: PPO

## 2018-01-24 ENCOUNTER — Other Ambulatory Visit: Payer: Self-pay | Admitting: Podiatry

## 2018-01-24 VITALS — BP 105/57 | HR 64

## 2018-01-24 DIAGNOSIS — M79671 Pain in right foot: Secondary | ICD-10-CM

## 2018-01-24 DIAGNOSIS — M722 Plantar fascial fibromatosis: Secondary | ICD-10-CM

## 2018-01-24 DIAGNOSIS — M79672 Pain in left foot: Principal | ICD-10-CM

## 2018-01-30 DIAGNOSIS — N3000 Acute cystitis without hematuria: Secondary | ICD-10-CM | POA: Diagnosis not present

## 2018-02-01 ENCOUNTER — Encounter: Payer: Self-pay | Admitting: Pulmonary Disease

## 2018-02-01 ENCOUNTER — Ambulatory Visit: Payer: PPO | Admitting: Pulmonary Disease

## 2018-02-01 ENCOUNTER — Ambulatory Visit (INDEPENDENT_AMBULATORY_CARE_PROVIDER_SITE_OTHER): Payer: Self-pay | Admitting: Orthotics

## 2018-02-01 VITALS — BP 132/78 | HR 54 | Ht 68.0 in | Wt 211.0 lb

## 2018-02-01 DIAGNOSIS — M722 Plantar fascial fibromatosis: Secondary | ICD-10-CM

## 2018-02-01 DIAGNOSIS — J449 Chronic obstructive pulmonary disease, unspecified: Secondary | ICD-10-CM | POA: Diagnosis not present

## 2018-02-01 MED ORDER — ALBUTEROL SULFATE (2.5 MG/3ML) 0.083% IN NEBU: 2.5000 mg | INHALATION_SOLUTION | Freq: Four times a day (QID) | RESPIRATORY_TRACT | 12 refills | Status: DC | PRN

## 2018-02-01 MED ORDER — UMECLIDINIUM-VILANTEROL 62.5-25 MCG/INH IN AEPB: 1.0000 | INHALATION_SPRAY | Freq: Every day | RESPIRATORY_TRACT | 0 refills | Status: DC

## 2018-02-01 NOTE — Progress Notes (Signed)
Patient came into today to be cast for Custom Foot Orthotics. Upon recommendation of Dr. Amalia Hailey Patient presents with foot pain b/l Goals are RF stability, forefoot cushioning Plan vendor [place]  Patient advised/signed ABN that medicare HTA will not cover.

## 2018-02-01 NOTE — Addendum Note (Signed)
Addended by: Len Blalock on: 02/01/2018 10:31 AM   Modules accepted: Orders

## 2018-02-01 NOTE — Patient Instructions (Signed)
COPD with persistent problems of shortness of breath: Continue Anoro 1 puff daily Use albuterol as needed for chest tightness wheezing or shortness of breath or prior to physical activity Get a flu shot in the fall Practice good hand hygiene Follow-up in 6 months

## 2018-02-01 NOTE — Progress Notes (Signed)
Subjective:    Patient ID: Carl Lee, male    DOB: [birth year], 62 y.o.   MRN: [id number]  Synopsis: Mr. Velazco had a CABG in 2015 and pulmonary critical care medicine was consulted for a COPD exacerbation. July 2015 lung function testing showed a ratio 62%, FEV1 of 2.10 L (56% predicted), total lung capacity 6.35 L (80% predicted). He smoked for many years and quit smoking in 2015.  He had a Zenker dierticulum that would cause a lot of trouble, this was repaired 09/2017.     HPI  Chief Complaint  Patient presents with  . Follow-up    pt states he is doing fair, notes sob with exertion, nonprod cough, some sinus congestion.     Since the last visit he has been doing OK.  He says that the heat has really bothered his breahting a lot.  He still goes out in the mornings to mow the yard and if the heat is bad it really bothers him.  He is not more congested, not having more mucus production.  He has a scratchy throat that makes him cough.  He has some reflux.  He has a history of Zenker divierticulum and this was repaired in March.  He still has some soreness in his throat. He is supposed to see ENT in 6 weeks.   He is still using Anoro daily.  He doesn't have thrush with this so he likes that a lot.  He wonders if it is helping much.    Past Medical History:  Diagnosis Date  . AAA (abdominal aortic aneurysm) ([place]) 03/25/12   s/p endovascular repair  . Adenomatous polyp 06/2009  . Atrial tachycardia, paroxysmal ([place]) 06/18/2016  . Bladder cancer ([place]) 08/16/2008   recurrence 2015 and 2018 treated with office fulguration  . CAD (coronary artery disease), native coronary artery    S/p 3v CABG 01/2014   . Childhood asthma    as a child  . Complication of anesthesia    "takes me a year to get over"  . Contact dermatitis    atypical [name])  . COPD (chronic obstructive pulmonary disease) ([place])   . Coronary artery disease    a.2015: CABG w/ LIMA-LAD, RIMA-OM, SVG-RCA b. Cath on 07/10/15  w/ patent LIMA-LAD and RIMA-OM. SVG-RCA occluded but collaterals present  . Diverticulosis 2013   severe by CT and colonoscopy  . Environmental allergies    improved as ages  . Fracture of lateral malleolus of left ankle 08/29/2013  . Headache    hasn't had migraines in years  . Hepatic steatosis 06/2015   by [place]  . Hx of migraines   . Hypertension   . Lumbosacral radiculopathy at S1 03/2012   left, with spinal stenosis (MRI 04/2013) improved with TF ESI L5/S1 and S1/2 ([facility] [place])  . OSA (obstructive sleep apnea) 09/04/2014   Severe with AHI 35/hr, uses BIPAP  . Overweight (BMI 25.0-29.9) 01/26/2017  . Pre-diabetes   . Prediabetes 06/08/2014   CAD   . Resistance to clopidogrel 2015   drug metabolism panel run - asked to scan  . Spinal stenosis    LS1 nerve root impingement from bulging disc  . Vitamin B12 deficiency   . Zenker diverticulum      Review of Systems  Constitutional: Positive for fatigue. Negative for chills and fever.  HENT: Negative for postnasal drip, rhinorrhea and sinus pressure.   Respiratory: Positive for cough. Negative for shortness of breath and wheezing.   Cardiovascular:  Negative for chest pain, palpitations and leg swelling.       Objective:   Physical Exam Vitals:   02/01/18 0915  BP: 132/78  Pulse: (!) 54  SpO2: 96%  Weight: 211 lb (95.7 kg)  Height: 5\' 8"  (1.727 m)   RA  Gen: well appearing HENT: OP clear, TM's clear, neck supple PULM: Wheezing upper lobes bilaterally, normal percussion CV: RRR, no mgr, trace edema GI: BS+, soft, nontender Derm: no cyanosis or rash Psyche: normal mood and affect   CBC    Component Value Date/Time   WBC 7.7 06/29/2017 0955   RBC 4.99 06/29/2017 0955   HGB 15.9 06/29/2017 0955   HGB 16.1 02/10/2017 1103   HCT 47.3 06/29/2017 0955   HCT 46.7 02/10/2017 1103   PLT 248.0 06/29/2017 0955   PLT 291 02/10/2017 1103   MCV 94.9 06/29/2017 0955   MCV 91 02/10/2017 1103   MCV 96 01/09/2014 1237    MCH 31.0 03/09/2017 0549   MCHC 33.7 06/29/2017 0955   RDW 14.1 06/29/2017 0955   RDW 13.6 02/10/2017 1103   RDW 13.5 01/09/2014 1237   LYMPHSABS 2.0 06/29/2017 0955   LYMPHSABS 2.6 02/10/2017 1103   MONOABS 0.7 06/29/2017 0955   EOSABS 0.3 06/29/2017 0955   EOSABS 0.4 02/10/2017 1103   BASOSABS 0.1 06/29/2017 0955   BASOSABS 0.1 02/10/2017 1103   PFT July 2015 lung function testing showed a ratio 62%, FEV1 of 2.10 L (56% predicted), total lung capacity 6.35 L (80% predicted).  April 2017 pulmonary function testing: FEV1 1.8 L, total lung capacity 99% predicted, DLCO 39% predicted  Echo 11/12/2015 TTE > normal LVEF, RV mildly dilated and systolic function mildly reduced  Records from his office visit with cardiology on 05/24/2016 reviewed where his hypertension was managed in addition to ongoing chest pain.    Assessment & Plan:   COPD, moderate ([place])  Discussion: This has been a stable interval for Brevyn despite the exacerbation this year.  He does have some wheezing on exam and he notes some shortness of breath when he exerts himself.  In general he feels the Anoro is not that helpful.  I have prescribed albuterol nebulized to use as needed for shortness of breath and have encouraged him to use it prior to physical activity.  We talked about the importance of good hand hygiene and infection prevention.  Plan: COPD with persistent problems of shortness of breath: Continue Anoro 1 puff daily Use albuterol as needed for chest tightness wheezing or shortness of breath or prior to physical activity Get a flu shot in the fall Practice good hand hygiene Follow-up in 6 months  Greater than 50% of this 25-minute visit spent face-to-face   Current Outpatient Medications:  .  acetaminophen (TYLENOL) 325 MG tablet, Take 650 mg by mouth every 6 (six) hours as needed for moderate pain or headache., Disp: , Rfl:  .  albuterol (PROAIR HFA) 108 (90 Base) MCG/ACT inhaler, Inhale 2 puffs into  the lungs every 6 (six) hours as needed for wheezing or shortness of breath., Disp: 1 Inhaler, Rfl: 2 .  amLODipine (NORVASC) 5 MG tablet, TAKE 1 TABLET BY MOUTH EVERY DAY, Disp: 90 tablet, Rfl: 3 .  aspirin EC 325 MG EC tablet, Take 1 tablet (325 mg total) by mouth daily., Disp: , Rfl:  .  atorvastatin (LIPITOR) 20 MG tablet, TAKE 1 TABLET (20 MG TOTAL) BY MOUTH DAILY AT 6 PM., Disp: 90 tablet, Rfl: 2 .  betamethasone dipropionate (  DIPROLENE) 0.05 % ointment, , Disp: , Rfl:  .  cefUROXime (CEFTIN) 500 MG tablet, Take 500 mg by mouth 2 (two) times daily., Disp: , Rfl: 0 .  famotidine (PEPCID) 20 MG tablet, TAKE 1 TABLET (20 MG TOTAL) BY MOUTH 2 (TWO) TIMES DAILY., Disp: 180 tablet, Rfl: 3 .  metoprolol tartrate (LOPRESSOR) 25 MG tablet, TAKE 0.5 TABLETS (12.5 MG TOTAL) BY MOUTH 2 (TWO) TIMES DAILY., Disp: 90 tablet, Rfl: 2 .  Propylene Glycol (SYSTANE BALANCE) 0.6 % SOLN, Place 1 drop into both eyes daily as needed (for dry eyes)., Disp: , Rfl:  .  tamsulosin (FLOMAX) 0.4 MG CAPS capsule, Take 1 capsule by mouth daily., Disp: , Rfl:  .  umeclidinium-vilanterol (ANORO ELLIPTA) 62.5-25 MCG/INH AEPB, Inhale 1 puff into the lungs daily., Disp: 180 each, Rfl: 1 .  vitamin B-12 (CYANOCOBALAMIN) 500 MCG tablet, Take 1 tablet (500 mcg total) by mouth every other day., Disp: , Rfl:

## 2018-02-02 NOTE — Progress Notes (Signed)
Subjective: 62 year old male presenting today as a new patient with a chief complaint of throbbing bilateral foot pain that has been ongoing for several years. He reports associated pain to the plantar aspect of the heels and the balls of the feet. He states the pain is greater in the right foot than the left and he experiences burning and itching as well. Walking or standing for long periods of time increases the pain. He has not done anything for treatment. Patient is here for further evaluation and treatment.   Past Medical History:  Diagnosis Date  . AAA (abdominal aortic aneurysm) ([place]) 03/25/12   s/p endovascular repair  . Adenomatous polyp 06/2009  . Atrial tachycardia, paroxysmal ([place]) 06/18/2016  . Bladder cancer ([place]) 08/16/2008   recurrence 2015 and 2018 treated with office fulguration  . CAD (coronary artery disease), native coronary artery    S/p 3v CABG 01/2014   . Childhood asthma    as a child  . Complication of anesthesia    "takes me a year to get over"  . Contact dermatitis    atypical [name])  . COPD (chronic obstructive pulmonary disease) ([place])   . Coronary artery disease    a.2015: CABG w/ LIMA-LAD, RIMA-OM, SVG-RCA b. Cath on 07/10/15 w/ patent LIMA-LAD and RIMA-OM. SVG-RCA occluded but collaterals present  . Diverticulosis 2013   severe by CT and colonoscopy  . Environmental allergies    improved as ages  . Fracture of lateral malleolus of left ankle 08/29/2013  . Headache    hasn't had migraines in years  . Hepatic steatosis 06/2015   by [place]  . Hx of migraines   . Hypertension   . Lumbosacral radiculopathy at S1 03/2012   left, with spinal stenosis (MRI 04/2013) improved with TF ESI L5/S1 and S1/2 ([facility] [place])  . OSA (obstructive sleep apnea) 09/04/2014   Severe with AHI 35/hr, uses BIPAP  . Overweight (BMI 25.0-29.9) 01/26/2017  . Pre-diabetes   . Prediabetes 06/08/2014   CAD   . Resistance to clopidogrel 2015   drug metabolism panel run -  asked to scan  . Spinal stenosis    LS1 nerve root impingement from bulging disc  . Vitamin B12 deficiency   . Zenker diverticulum      Objective: Physical Exam General: The patient is alert and oriented x3 in no acute distress.  Dermatology: Skin is warm, dry and supple bilateral lower extremities. Negative for open lesions or macerations bilateral.   Vascular: Dorsalis Pedis and Posterior Tibial pulses palpable bilateral.  Capillary fill time is immediate to all digits.  Neurological: Epicritic and protective threshold intact bilateral.   Musculoskeletal: Pain on palpation throughout the bilateral balls of feet. Tenderness to palpation to the plantar aspect of the bilateral heels along the plantar fascia. All other joints range of motion within normal limits bilateral. Strength 5/5 in all groups bilateral.   Radiographic exam: Normal osseous mineralization. Joint spaces preserved. No fracture/dislocation/boney destruction. No other soft tissue abnormalities or radiopaque foreign bodies.   Assessment: 1. plantar fasciitis bilateral feet 2. Metatarsalgia bilateral 3. Generalized foot pain bilateral   Plan of Care:  1. Patient evaluated. Xrays reviewed.   2. Appointment with Liliane Channel for custom molded orthotics.  3. Recommended good shoe gear.  4. Return to clinic in 4 weeks.    Edrick Kins, DPM [facility]  Dr. Edrick Kins, DPM    [address]  [place], [place] [address]                Office [phone]  Fax [phone]

## 2018-02-07 ENCOUNTER — Encounter: Payer: Self-pay | Admitting: Cardiology

## 2018-02-07 ENCOUNTER — Ambulatory Visit: Payer: PPO | Admitting: Cardiology

## 2018-02-07 VITALS — BP 114/62 | HR 71 | Ht 68.0 in | Wt 211.8 lb

## 2018-02-07 DIAGNOSIS — J449 Chronic obstructive pulmonary disease, unspecified: Secondary | ICD-10-CM | POA: Diagnosis not present

## 2018-02-07 DIAGNOSIS — I10 Essential (primary) hypertension: Secondary | ICD-10-CM | POA: Diagnosis not present

## 2018-02-07 DIAGNOSIS — G4733 Obstructive sleep apnea (adult) (pediatric): Secondary | ICD-10-CM

## 2018-02-07 DIAGNOSIS — R269 Unspecified abnormalities of gait and mobility: Secondary | ICD-10-CM | POA: Diagnosis not present

## 2018-02-07 NOTE — Progress Notes (Signed)
Cardiology Office Note:    Date:  02/07/2018   ID:  Vir, Whetstine 1956/05/09, MRN [id number]  PCP:  [name], MD  Cardiologist:  No primary care provider on file.    Referring MD: [name], MD   No chief complaint on file.   History of Present Illness:    Carl Lee is a 62 y.o. male with a hx of HTN, severe OSA with an AHI of 35 events per hour with oxygen desaturations as low ast 81% now on CPAP.  He is here today for followup and is doing well.  He denies any chest pain or pressure, SOB, DOE, PND, orthopnea, LE edema, dizziness, palpitations or syncope. He is compliant with his meds and is tolerating meds with no SE.     Past Medical History:  Diagnosis Date  . AAA (abdominal aortic aneurysm) ([place]) 03/25/12   s/p endovascular repair  . Adenomatous polyp 06/2009  . Atrial tachycardia, paroxysmal ([place]) 06/18/2016  . Bladder cancer ([place]) 08/16/2008   recurrence 2015 and 2018 treated with office fulguration  . CAD (coronary artery disease), native coronary artery    S/p 3v CABG 01/2014   . Childhood asthma    as a child  . Complication of anesthesia    "takes me a year to get over"  . Contact dermatitis    atypical [name])  . COPD (chronic obstructive pulmonary disease) ([place])   . Coronary artery disease    a.2015: CABG w/ LIMA-LAD, RIMA-OM, SVG-RCA b. Cath on 07/10/15 w/ patent LIMA-LAD and RIMA-OM. SVG-RCA occluded but collaterals present  . Diverticulosis 2013   severe by CT and colonoscopy  . Environmental allergies    improved as ages  . Fracture of lateral malleolus of left ankle 08/29/2013  . Headache    hasn't had migraines in years  . Hepatic steatosis 06/2015   by [place]  . Hx of migraines   . Hypertension   . Lumbosacral radiculopathy at S1 03/2012   left, with spinal stenosis (MRI 04/2013) improved with TF ESI L5/S1 and S1/2 ([facility] [place])  . OSA (obstructive sleep apnea) 09/04/2014   Severe with AHI 35/hr, uses BIPAP  . Overweight  (BMI 25.0-29.9) 01/26/2017  . Pre-diabetes   . Prediabetes 06/08/2014   CAD   . Resistance to clopidogrel 2015   drug metabolism panel run - asked to scan  . Spinal stenosis    LS1 nerve root impingement from bulging disc  . Vitamin B12 deficiency   . Zenker diverticulum     Past Surgical History:  Procedure Laterality Date  . ABDOMINAL AORTIC ANEURYSM REPAIR  03/25/12   endovascular  . ABI  05/2013   WNL, L TBI low at 0.66  . Bladder cancer  March 2010 and Oct. 2015   Ernst Spell) x 2  . CARDIAC CATHETERIZATION N/A 07/10/2015   Procedure: Left Heart Cath and Cors/Grafts Angiography;  Surgeon: [name], MD;  Location: [place] [facility];  Service: Cardiovascular;  Laterality: N/A;  . COLONOSCOPY  06/2012   hyperplastic polyp, diverticulosis (jacobs) rec rpt 5 yrs  . COLONOSCOPY WITH PROPOFOL N/A 08/10/2017   TA, rpt 5 yrs Ardis Hughs, Melene Plan, MD)  . CORONARY ARTERY BYPASS GRAFT N/A 02/07/2014   Procedure: CORONARY ARTERY BYPASS GRAFTING (CABG);  Surgeon: [name], MD;  Location: [place];  Service: Open Heart Surgery;  Laterality: N/A;  CABG X 3, BILATERAL LIMA, EVH  . CYSTOSCOPY  08/16/08   Bladder Cancer  . EPIDURAL  BLOCK INJECTION Left 09/2014, 10/2014, 12/2014   medial L2,3,4, dorsal L5 ramus blocks x2, L L5/S1 and S1/2 transforaminal ESI ([facility] [place])  . ESI  04/2013, 06/2013   L L5S1, S12 transforaminal ESI (Dr.  [name])  . ESI Left 04/2014, 05/2014, 06/2014   L5/S1, S1/2; rpt; L4/5  . ESI  03/2016   R L5/S1 interlaminar ESI  . ESOPHAGEAL MANOMETRY N/A 08/10/2017   Procedure: ESOPHAGEAL MANOMETRY (EM);  Surgeon: [name], MD;  [facility];  Service: Endoscopy;  Laterality: N/A;  . ESOPHAGOGASTRODUODENOSCOPY (EGD) WITH PROPOFOL N/A 08/10/2017   Procedure: ESOPHAGOGASTRODUODENOSCOPY (EGD) WITH PROPOFOL;  Surgeon: [name], MD;  [facility];  Service: Endoscopy;  Laterality: N/A;  . FULGURATION OF BLADDER TUMOR   01/2017   recurrent 61mm L lateral wall papillary transitional cell carcinoma ([place])  . INTRAOPERATIVE TRANSESOPHAGEAL ECHOCARDIOGRAM N/A 02/07/2014   Procedure: INTRAOPERATIVE TRANSESOPHAGEAL ECHOCARDIOGRAM;  Surgeon: [name], MD;  Location: [place];  Service: Open Heart Surgery;  Laterality: N/A;  . LEFT HEART CATH AND CORS/GRAFTS ANGIOGRAPHY N/A 02/11/2017   Procedure: LEFT HEART CATH AND CORS/GRAFTS ANGIOGRAPHY;  Surgeon: [name], MD;  Location: [place] [facility];  Service: Cardiovascular;  Laterality: N/A;  . LEFT HEART CATHETERIZATION WITH CORONARY ANGIOGRAM N/A 02/01/2014   Procedure: LEFT HEART CATHETERIZATION WITH CORONARY ANGIOGRAM;  Surgeon: [name], MD;  Location: [facility];  Service: Cardiovascular;  Laterality: N/A;  . PRP epidural injection Left 11/2015   L5/S1, S1/2 transforaminal epidural PRP injections under fluoroscopy ([place])  . TONSILLECTOMY AND ADENOIDECTOMY  1973  . TOTAL HIP ARTHROPLASTY Left 03/08/2017  . TOTAL HIP ARTHROPLASTY Left 03/08/2017   Procedure: LEFT TOTAL HIP ARTHROPLASTY ANTERIOR APPROACH;  Surgeon: [name], MD;  Location: [place];  Service: Orthopedics;  Laterality: Left;  [name] VASECTOMY      Current Medications: Current Meds  Medication Sig  . acetaminophen (TYLENOL) 325 MG tablet Take 650 mg by mouth every 6 (six) hours as needed for moderate pain or headache.  . albuterol (PROAIR HFA) 108 (90 Base) MCG/ACT inhaler Inhale 2 puffs into the lungs every 6 (six) hours as needed for wheezing or shortness of breath.  [name] albuterol (PROVENTIL) (2.5 MG/3ML) 0.083% nebulizer solution Take 3 mLs (2.5 mg total) by nebulization every 6 (six) hours as needed for wheezing or shortness of breath.  [name] amLODipine (NORVASC) 5 MG tablet TAKE 1 TABLET BY MOUTH EVERY DAY  . aspirin EC 325 MG EC tablet Take 1 tablet (325 mg total) by mouth daily.  [name] atorvastatin (LIPITOR) 20 MG tablet TAKE 1 TABLET (20 MG TOTAL) BY MOUTH  DAILY AT 6 PM.  . betamethasone dipropionate (DIPROLENE) 0.05 % ointment   . cefUROXime (CEFTIN) 500 MG tablet Take 500 mg by mouth 2 (two) times daily.  . famotidine (PEPCID) 20 MG tablet TAKE 1 TABLET (20 MG TOTAL) BY MOUTH 2 (TWO) TIMES DAILY.  . metoprolol tartrate (LOPRESSOR) 25 MG tablet TAKE 0.5 TABLETS (12.5 MG TOTAL) BY MOUTH 2 (TWO) TIMES DAILY.  [name] Propylene Glycol (SYSTANE BALANCE) 0.6 % SOLN Place 1 drop into both eyes daily as needed (for dry eyes).  . tamsulosin (FLOMAX) 0.4 MG CAPS capsule Take 1 capsule by mouth daily.  [name] umeclidinium-vilanterol (ANORO ELLIPTA) 62.5-25 MCG/INH AEPB Inhale 1 puff into the lungs daily.  [name] umeclidinium-vilanterol (ANORO ELLIPTA) 62.5-25 MCG/INH AEPB Inhale 1 puff into the lungs daily.  . vitamin B-12 (CYANOCOBALAMIN) 500 MCG tablet Take 1 tablet (500 mcg  total) by mouth every other day.     Allergies:   Codeine and Doxycycline   Social History   Socioeconomic History  . Marital status: Married    Spouse name: Not on file  . Number of children: 2  . Years of education: Not on file  . Highest education level: Not on file  Occupational History  . Occupation: Self Employed  Social Needs  . Financial resource strain: Not on file  . Food insecurity:    Worry: Not on file    Inability: Not on file  . Transportation needs:    Medical: Not on file    Non-medical: Not on file  Tobacco Use  . Smoking status: Former Smoker    Packs/day: 1.00    Years: 40.00    Pack years: 40.00    Types: Cigarettes    Last attempt to quit: 02/05/2014    Years since quitting: 4.0  . Smokeless tobacco: Never Used  Substance and Sexual Activity  . Alcohol use: Yes    Alcohol/week: 0.0 oz    Comment: Rare- states 1 oz. per week  . Drug use: No  . Sexual activity: Not on file  Lifestyle  . Physical activity:    Days per week: Not on file    Minutes per session: Not on file  . Stress: Not on file  Relationships  . Social connections:    Talks on phone:  Not on file    Gets together: Not on file    Attends religious service: Not on file    Active member of club or organization: Not on file    Attends meetings of clubs or organizations: Not on file    Relationship status: Not on file  Other Topics Concern  . Not on file  Social History Narrative   Caffeine: 1 cup coffee/day   Lives with wife, 1 dog   grown children   Occupation: Therapist, sports - self employed.  Disability after CABG   Edu: 2 yrs college   Activity: fishing, walking occasionally   Diet: good water, fruits/vegetables daily     Family History: The patient's family history includes AAA (abdominal aortic aneurysm) in his maternal grandmother; Arrhythmia in his mother; CAD (age of onset: 20) in his father; COPD in his mother; Cancer in his mother; Dementia in his father; Diabetes in his father, mother, and sister; Heart attack in his father and maternal grandmother; Hyperlipidemia in his mother; Hypertension in his mother; Thyroid disease in his mother. There is no history of Colon cancer.  ROS:   Please see the history of present illness.    ROS  All other systems reviewed and negative.   EKGs/Labs/Other Studies Reviewed:    The following studies were reviewed today: PAP download  EKG:  EKG is not ordered today.   Recent Labs: 02/24/2017: ALT 9 06/29/2017: Hemoglobin 15.9; Platelets 248.0 12/28/2017: BUN 19; Creatinine, Ser 0.77; Potassium 4.0; Sodium 138   Recent Lipid Panel    Component Value Date/Time   CHOL 103 06/29/2017 0955   CHOL 170 01/05/2012   TRIG 63.0 06/29/2017 0955   TRIG 141 01/05/2012   HDL 43.20 06/29/2017 0955   CHOLHDL 2 06/29/2017 0955   VLDL 12.6 06/29/2017 0955   LDLCALC 47 06/29/2017 0955   LDLDIRECT 62.0 12/28/2017 0827    Physical Exam:    VS:  Ht 5\' 8"  (1.727 m)   Wt 211 lb 12.8 oz (96.1 kg)   BMI 32.20 kg/m  Wt Readings from Last 3 Encounters:  02/07/18 211 lb 12.8 oz (96.1 kg)  02/01/18 211 lb (95.7 kg)    01/03/18 208 lb (94.3 kg)     GEN:  Well nourished, well developed in no acute distress HEENT: Normal NECK: No JVD; No carotid bruits LYMPHATICS: No lymphadenopathy CARDIAC: RRR, no murmurs, rubs, gallops RESPIRATORY:  Clear to auscultation without rales, wheezing or rhonchi  ABDOMEN: Soft, non-tender, non-distended MUSCULOSKELETAL:  No edema; No deformity  SKIN: Warm and dry NEUROLOGIC:  Alert and oriented x 3 PSYCHIATRIC:  Normal affect   ASSESSMENT:    1. OSA (obstructive sleep apnea)   2. Essential hypertension    PLAN:    In order of problems listed above:  1.  OSA - the patient is tolerating PAP therapy well without any problems. The PAP download was reviewed today and showed an AHI of 1.9/hr on 16/12 cm H2O with 93% compliance in using more than 4 hours nightly.  The patient has been using and benefiting from PAP use and will continue to benefit from therapy.   2.  HTN -BP is well controlled on exam today.  He will continue on amlodipine 5 mg daily, Lopressor 0.5 mg twice daily.   Medication Adjustments/Labs and Tests Ordered: Current medicines are reviewed at length with the patient today.  Concerns regarding medicines are outlined above.  No orders of the defined types were placed in this encounter.  No orders of the defined types were placed in this encounter.   Signed, Fransico Him, MD  02/07/2018 7:45 AM    [place]

## 2018-02-07 NOTE — Patient Instructions (Signed)
Medication Instructions:  Your provider recommends that you continue on your current medications as directed. Please refer to the Current Medication list given to you today.   Labwork: None  Testing/Procedures: None  Follow-Up: Your provider wants you to follow-up in: 1 year with Dr. Radford Pax. You will receive a reminder letter in the mail two months in advance. If you don't receive a letter, please call our office to schedule the follow-up appointment.    Any Other Special Instructions Will Be Listed Below (If Applicable). New supplies have been ordered for you.     If you need a refill on your cardiac medications before your next appointment, please call your pharmacy.

## 2018-02-08 ENCOUNTER — Other Ambulatory Visit: Payer: Self-pay | Admitting: Cardiovascular Disease

## 2018-02-13 DIAGNOSIS — N3021 Other chronic cystitis with hematuria: Secondary | ICD-10-CM | POA: Diagnosis not present

## 2018-02-13 DIAGNOSIS — R3915 Urgency of urination: Secondary | ICD-10-CM | POA: Diagnosis not present

## 2018-02-13 DIAGNOSIS — N401 Enlarged prostate with lower urinary tract symptoms: Secondary | ICD-10-CM | POA: Diagnosis not present

## 2018-02-13 DIAGNOSIS — Z8551 Personal history of malignant neoplasm of bladder: Secondary | ICD-10-CM | POA: Diagnosis not present

## 2018-02-22 ENCOUNTER — Ambulatory Visit (INDEPENDENT_AMBULATORY_CARE_PROVIDER_SITE_OTHER): Payer: PPO | Admitting: Orthotics

## 2018-02-22 DIAGNOSIS — M79671 Pain in right foot: Secondary | ICD-10-CM

## 2018-02-22 DIAGNOSIS — M79672 Pain in left foot: Secondary | ICD-10-CM

## 2018-02-22 DIAGNOSIS — M722 Plantar fascial fibromatosis: Secondary | ICD-10-CM

## 2018-02-22 NOTE — Progress Notes (Signed)
Patient came in today to pick up custom made foot orthotics.  The goals were accomplished and the patient reported no dissatisfaction with said orthotics.  Patient was advised of breakin period and how to report any issues. 

## 2018-03-06 ENCOUNTER — Encounter (INDEPENDENT_AMBULATORY_CARE_PROVIDER_SITE_OTHER): Payer: Self-pay | Admitting: Physician Assistant

## 2018-03-06 ENCOUNTER — Ambulatory Visit (INDEPENDENT_AMBULATORY_CARE_PROVIDER_SITE_OTHER): Payer: PPO | Admitting: Physician Assistant

## 2018-03-06 ENCOUNTER — Ambulatory Visit (INDEPENDENT_AMBULATORY_CARE_PROVIDER_SITE_OTHER): Payer: PPO

## 2018-03-06 DIAGNOSIS — M7062 Trochanteric bursitis, left hip: Secondary | ICD-10-CM

## 2018-03-06 DIAGNOSIS — M25552 Pain in left hip: Secondary | ICD-10-CM

## 2018-03-06 NOTE — Progress Notes (Signed)
Office Visit Note   Patient: Carl Lee           Date of Birth: 1956/04/28           MRN: [id number] Visit Date: 03/06/2018              Requested by: [name], MD [address] [place], [place] [address] PCP: [name], MD   Assessment & Plan: Visit Diagnoses:  1. Pain in left hip   2. Trochanteric bursitis, left hip     Plan: Due to the fact that patient continues to have pain lateral aspect of his hip and down to the knee with the findings of nonpalpable dorsal pedal pulse on the left and monophasic pulse with hand-held Doppler.  We will send him to vascular for evaluation for possible vascular claudication pain in the left thigh.  Follow-Up Instructions: Return in about 3 months (around 06/06/2018).   Orders:  Orders Placed This Encounter  Procedures  . XR HIP UNILAT W OR W/O PELVIS 2-3 VIEWS LEFT  . Ambulatory referral to Vascular Surgery   No orders of the defined types were placed in this encounter.     Procedures: No procedures performed   Clinical Data: No additional findings.   Subjective: Chief Complaint  Patient presents with  . Left Hip - Follow-up    HPI Carl Lee returns today due to left hip pain is now 12 months status post left total hip arthroplasty.  States is not having groin pain.  Pain is in the lateral aspect of the hip.  He notes that with prolonged walking that he is initially early in the day able to walk a considerable distance and then he started having pain in the lateral aspect of the hip down to the knee has to stop walking due to the pain and then sits and rest.  He then is unable to walk the same amount of distance due to the pain lateral aspect of the hip.  Does not really describe the pain as a numbness or tingling or burning sensation to his pain lateral aspect of the hip down to the knee.  He is tried physical therapy and injections for trochanteric bursitis in the minimal relief.  Does have COPD still  occasionally smokes a cigarette.  History of aortic aneurysm status post endovascular repair.  No bowel bladder dysfunction.  No radicular symptoms down either leg.  Positive for low back pain.  Denies any calf pain bilaterally.     Review of Systems Please see HPI otherwise negative  Objective: Vital Signs: There were no vitals taken for this visit.  Physical Exam  Constitutional: He is oriented to person, place, and time. He appears well-developed and well-nourished. No distress.  Pulmonary/Chest: Effort normal.  Neurological: He is alert and oriented to person, place, and time.  Skin: Skin is warm and dry. He is not diaphoretic.    Ortho Exam 5 out of 5 strength throughout the lower extremities against resistance negative straight leg raise bilaterally.  Good range of motion bilateral hips.  Calf supple nontender bilaterally.  Sensation grossly intact bilateral feet.  No rashes skin lesions ulcerations on either foot.  Unable to palpate dorsal pedal pulse on the left.  2+ dorsal pedal pulse on the right.  Handheld Doppler meals bounding pulse on the right.  Faint monophasic pulse on the left. Left hip incision is well-healed no signs of infection.  No seroma.  Tenderness over the left trochanteric region.  Specialty Comments:  No specialty comments available.  Imaging: Xr Hip Unilat W Or W/o Pelvis 2-3 Views Left  Result Date: 03/06/2018 AP pelvis and lateral view of the left hip: Well-seated left total hip arthroplasty without any signs of complication.  No acute fractures    PMFS History: Patient Active Problem List   Diagnosis Date Noted  . Bilateral hearing loss 12/29/2017  . Chronic foot pain 12/29/2017  . Acute cystitis 11/24/2017  . History of colonic polyps   . Diverticulosis of colon without hemorrhage   . Urinary frequency 06/30/2017  . Status post total replacement of left hip 03/08/2017  . Abnormal nuclear stress test   . Overweight (BMI 25.0-29.9) 01/26/2017  .  Radiculopathy due to lumbar intervertebral disc disorder 10/21/2016  . Osteoarthritis of left hip 08/16/2016  . Atrial tachycardia, paroxysmal ([place]) 06/18/2016  . Chronic fatigue 04/27/2016  . Coronary artery disease   . Abdominal discomfort 06/24/2015  . Hepatic steatosis 06/12/2015  . Right trigeminal neuralgia 11/27/2014  . Chronic chest pain 10/30/2014  . Vitamin B12 deficiency   . OSA (obstructive sleep apnea) 09/04/2014  . Headache 06/20/2014  . Prediabetes 06/08/2014  . Zenker diverticulum 04/19/2014  . COPD, moderate ([place]) 03/26/2014  . S/P CABG x 3 02/07/2014  . Occlusion and stenosis of vertebral artery without mention of cerebral infarction 11/12/2013  . Skin rash 04/17/2013  . Healthcare maintenance 01/09/2013  . Dyslipidemia 01/09/2013  . Lumbosacral radiculopathy at S1 09/14/2012  . Hx of migraines   . Arthritis   . History of repair of aneurysm of abdominal aorta using endovascular stent graft 05/01/2012  . Adenomatous polyps 03/24/2012  . Ex-smoker 03/24/2012  . Hypertension 03/24/2012  . History of bladder cancer 03/24/2012   Past Medical History:  Diagnosis Date  . AAA (abdominal aortic aneurysm) ([place]) 03/25/12   s/p endovascular repair  . Adenomatous polyp 06/2009  . Atrial tachycardia, paroxysmal ([place]) 06/18/2016  . Bladder cancer ([place]) 08/16/2008   recurrence 2015 and 2018 treated with office fulguration  . CAD (coronary artery disease), native coronary artery    S/p 3v CABG 01/2014   . Childhood asthma    as a child  . Complication of anesthesia    "takes me a year to get over"  . Contact dermatitis    atypical [name])  . COPD (chronic obstructive pulmonary disease) ([place])   . Coronary artery disease    a.2015: CABG w/ LIMA-LAD, RIMA-OM, SVG-RCA b. Cath on 07/10/15 w/ patent LIMA-LAD and RIMA-OM. SVG-RCA occluded but collaterals present  . Diverticulosis 2013   severe by CT and colonoscopy  . Environmental allergies    improved as ages  .  Fracture of lateral malleolus of left ankle 08/29/2013  . Headache    hasn't had migraines in years  . Hepatic steatosis 06/2015   by [place]  . Hx of migraines   . Hypertension   . Lumbosacral radiculopathy at S1 03/2012   left, with spinal stenosis (MRI 04/2013) improved with TF ESI L5/S1 and S1/2 ([facility] [place])  . OSA (obstructive sleep apnea) 09/04/2014   Severe with AHI 35/hr, uses BIPAP  . Overweight (BMI 25.0-29.9) 01/26/2017  . Pre-diabetes   . Prediabetes 06/08/2014   CAD   . Resistance to clopidogrel 2015   drug metabolism panel run - asked to scan  . Spinal stenosis    LS1 nerve root impingement from bulging disc  . Vitamin B12 deficiency   . Zenker diverticulum     Family History  Problem Relation Age of Onset  . Diabetes Mother   . Arrhythmia Mother        pacemaker  . Cancer Mother        Bladder  . COPD Mother   . Thyroid disease Mother   . Hyperlipidemia Mother   . Hypertension Mother   . Diabetes Father   . CAD Father 32       CHF, MI  . Dementia Father   . Heart attack Father   . Diabetes Sister   . Heart attack Maternal Grandmother   . AAA (abdominal aortic aneurysm) Maternal Grandmother   . Colon cancer Neg Hx     Past Surgical History:  Procedure Laterality Date  . ABDOMINAL AORTIC ANEURYSM REPAIR  03/25/12   endovascular  . ABI  05/2013   WNL, L TBI low at 0.66  . Bladder cancer  March 2010 and Oct. 2015   Ernst Spell) x 2  . CARDIAC CATHETERIZATION N/A 07/10/2015   Procedure: Left Heart Cath and Cors/Grafts Angiography;  Surgeon: [name], MD;  Location: [place] [facility];  Service: Cardiovascular;  Laterality: N/A;  . COLONOSCOPY  06/2012   hyperplastic polyp, diverticulosis (jacobs) rec rpt 5 yrs  . COLONOSCOPY WITH PROPOFOL N/A 08/10/2017   TA, rpt 5 yrs Ardis Hughs, Melene Plan, MD)  . CORONARY ARTERY BYPASS GRAFT N/A 02/07/2014   Procedure: CORONARY ARTERY BYPASS GRAFTING (CABG);  Surgeon: [name], MD;  Location: [place];   Service: Open Heart Surgery;  Laterality: N/A;  CABG X 3, BILATERAL LIMA, EVH  . CYSTOSCOPY  08/16/08   Bladder Cancer  . EPIDURAL BLOCK INJECTION Left 09/2014, 10/2014, 12/2014   medial L2,3,4, dorsal L5 ramus blocks x2, L L5/S1 and S1/2 transforaminal ESI ([facility] [place])  . ESI  04/2013, 06/2013   L L5S1, S12 transforaminal ESI (Dr.  [name])  . ESI Left 04/2014, 05/2014, 06/2014   L5/S1, S1/2; rpt; L4/5  . ESI  03/2016   R L5/S1 interlaminar ESI  . ESOPHAGEAL MANOMETRY N/A 08/10/2017   Procedure: ESOPHAGEAL MANOMETRY (EM);  Surgeon: [name], MD;  [facility];  Service: Endoscopy;  Laterality: N/A;  . ESOPHAGOGASTRODUODENOSCOPY (EGD) WITH PROPOFOL N/A 08/10/2017   Procedure: ESOPHAGOGASTRODUODENOSCOPY (EGD) WITH PROPOFOL;  Surgeon: [name], MD;  [facility];  Service: Endoscopy;  Laterality: N/A;  . FULGURATION OF BLADDER TUMOR  01/2017   recurrent 88mm L lateral wall papillary transitional cell carcinoma ([place])  . INTRAOPERATIVE TRANSESOPHAGEAL ECHOCARDIOGRAM N/A 02/07/2014   Procedure: INTRAOPERATIVE TRANSESOPHAGEAL ECHOCARDIOGRAM;  Surgeon: [name], MD;  Location: [place];  Service: Open Heart Surgery;  Laterality: N/A;  . LEFT HEART CATH AND CORS/GRAFTS ANGIOGRAPHY N/A 02/11/2017   Procedure: LEFT HEART CATH AND CORS/GRAFTS ANGIOGRAPHY;  Surgeon: [name], MD;  Location: [place] [facility];  Service: Cardiovascular;  Laterality: N/A;  . LEFT HEART CATHETERIZATION WITH CORONARY ANGIOGRAM N/A 02/01/2014   Procedure: LEFT HEART CATHETERIZATION WITH CORONARY ANGIOGRAM;  Surgeon: [name], MD;  Location: [facility] LAB;  Service: Cardiovascular;  Laterality: N/A;  . PRP epidural injection Left 11/2015   L5/S1, S1/2 transforaminal epidural PRP injections under fluoroscopy ([facility])  . TONSILLECTOMY AND ADENOIDECTOMY  1973  . TOTAL HIP ARTHROPLASTY Left 03/08/2017  . TOTAL HIP ARTHROPLASTY Left 03/08/2017   Procedure:  LEFT TOTAL HIP ARTHROPLASTY ANTERIOR APPROACH;  Surgeon: [name], MD;  Location: [place];  Service: Orthopedics;  Laterality: Left;  [name] VASECTOMY     Social  History   Occupational History  . Occupation: Self Employed  Tobacco Use  . Smoking status: Former Smoker    Packs/day: 1.00    Years: 40.00    Pack years: 40.00    Types: Cigarettes    Last attempt to quit: 02/05/2014    Years since quitting: 4.0  . Smokeless tobacco: Never Used  Substance and Sexual Activity  . Alcohol use: Yes    Alcohol/week: 0.0 standard drinks    Comment: Rare- states 1 oz. per week  . Drug use: No  . Sexual activity: Not on file

## 2018-03-08 ENCOUNTER — Ambulatory Visit: Payer: PPO | Admitting: Orthotics

## 2018-03-08 DIAGNOSIS — M722 Plantar fascial fibromatosis: Secondary | ICD-10-CM

## 2018-03-08 DIAGNOSIS — M79672 Pain in left foot: Secondary | ICD-10-CM

## 2018-03-08 DIAGNOSIS — M79671 Pain in right foot: Secondary | ICD-10-CM

## 2018-03-08 NOTE — Progress Notes (Signed)
F/O needs adjsutments: Remove met pads, off load 1st and 5th with PPT (dancers pad) b/l  Lower arch and add 1/16 scaphoid pad.

## 2018-03-10 ENCOUNTER — Telehealth (INDEPENDENT_AMBULATORY_CARE_PROVIDER_SITE_OTHER): Payer: Self-pay | Admitting: Physician Assistant

## 2018-03-10 DIAGNOSIS — J449 Chronic obstructive pulmonary disease, unspecified: Secondary | ICD-10-CM | POA: Diagnosis not present

## 2018-03-10 DIAGNOSIS — G4733 Obstructive sleep apnea (adult) (pediatric): Secondary | ICD-10-CM | POA: Diagnosis not present

## 2018-03-10 DIAGNOSIS — R269 Unspecified abnormalities of gait and mobility: Secondary | ICD-10-CM | POA: Diagnosis not present

## 2018-03-10 NOTE — Telephone Encounter (Signed)
Patient called stating that he can not get into the Vascular doctor until November the 4th.  He was not sure if [name] would want him to wait that long to see if he has a blood clot.  Is there another doctor that he could see that might get him in sooner?  CB#[phone].  Thank you.

## 2018-03-10 NOTE — Telephone Encounter (Signed)
Please advise 

## 2018-03-14 NOTE — Telephone Encounter (Signed)
According to [facility] and his notes, the appointment with vascular surgery was not to assess for a blood clot.  It is to evaluate the blood flow in general into his lower extremities since he gets cramping and pain in his legs after he has been walking for a while.  This can be indicative of a circulation issue.  Thus the appointment is not urgent with vascular surgery.

## 2018-03-14 NOTE — Telephone Encounter (Signed)
Please advise, maybe talk to a vascular doctor? This is the one we talked about

## 2018-03-15 ENCOUNTER — Telehealth (INDEPENDENT_AMBULATORY_CARE_PROVIDER_SITE_OTHER): Payer: Self-pay | Admitting: Physician Assistant

## 2018-03-15 NOTE — Telephone Encounter (Signed)
Returned call to patient left message for return call

## 2018-03-16 NOTE — Telephone Encounter (Signed)
I talked to him and let him know we are trying to get him in sooner and that he DOES NOT have a blood clot

## 2018-03-16 NOTE — Telephone Encounter (Signed)
Patient called back and I advised him of the message from Dr. [name].  He stated that the appointment is not until December 4th not November 4th.  He also is concerned about flying next week due to [place] telling him that he was concerned about a blot clot in his leg.  CB#[phone].  Thank you.

## 2018-03-22 ENCOUNTER — Ambulatory Visit (HOSPITAL_COMMUNITY)
Admission: RE | Admit: 2018-03-22 | Discharge: 2018-03-22 | Disposition: A | Discharge status: Home / Self Care | Payer: PPO | Source: Ambulatory Visit | Attending: Vascular Surgery | Admitting: Vascular Surgery

## 2018-03-22 ENCOUNTER — Other Ambulatory Visit: Payer: Self-pay

## 2018-03-22 ENCOUNTER — Ambulatory Visit: Payer: PPO | Admitting: Orthotics

## 2018-03-22 DIAGNOSIS — M722 Plantar fascial fibromatosis: Secondary | ICD-10-CM

## 2018-03-22 DIAGNOSIS — I739 Peripheral vascular disease, unspecified: Secondary | ICD-10-CM | POA: Diagnosis not present

## 2018-03-22 NOTE — Progress Notes (Signed)
Will picke up adjusted f/o in [place] this afternoon.

## 2018-03-23 DIAGNOSIS — H25013 Cortical age-related cataract, bilateral: Secondary | ICD-10-CM | POA: Diagnosis not present

## 2018-03-23 DIAGNOSIS — R7303 Prediabetes: Secondary | ICD-10-CM | POA: Diagnosis not present

## 2018-03-23 DIAGNOSIS — H2513 Age-related nuclear cataract, bilateral: Secondary | ICD-10-CM | POA: Diagnosis not present

## 2018-03-23 DIAGNOSIS — H353131 Nonexudative age-related macular degeneration, bilateral, early dry stage: Secondary | ICD-10-CM | POA: Diagnosis not present

## 2018-03-23 LAB — HM DIABETES EYE EXAM

## 2018-03-27 ENCOUNTER — Encounter

## 2018-03-27 ENCOUNTER — Encounter (HOSPITAL_COMMUNITY): Payer: PPO

## 2018-03-27 ENCOUNTER — Encounter: Payer: PPO | Admitting: Surgery

## 2018-03-29 ENCOUNTER — Encounter (HOSPITAL_COMMUNITY): Payer: PPO

## 2018-04-03 ENCOUNTER — Telehealth: Payer: Self-pay

## 2018-04-03 NOTE — Telephone Encounter (Signed)
Copied from [place] [id number]. Topic: Inquiry >> Apr 03, 2018  9:43 AM Oliver Pila B wrote: Reason for CRM: pt is needing a letter for the getting out of [place] [employer]; pt is needing the letter by the 6th of Oct; contact pt to advise

## 2018-04-07 ENCOUNTER — Other Ambulatory Visit (INDEPENDENT_AMBULATORY_CARE_PROVIDER_SITE_OTHER): Payer: Self-pay

## 2018-04-07 DIAGNOSIS — M25552 Pain in left hip: Secondary | ICD-10-CM

## 2018-04-10 DIAGNOSIS — D485 Neoplasm of uncertain behavior of skin: Secondary | ICD-10-CM | POA: Diagnosis not present

## 2018-04-10 DIAGNOSIS — L905 Scar conditions and fibrosis of skin: Secondary | ICD-10-CM | POA: Diagnosis not present

## 2018-04-10 DIAGNOSIS — L814 Other melanin hyperpigmentation: Secondary | ICD-10-CM | POA: Diagnosis not present

## 2018-04-10 DIAGNOSIS — R269 Unspecified abnormalities of gait and mobility: Secondary | ICD-10-CM | POA: Diagnosis not present

## 2018-04-10 DIAGNOSIS — L821 Other seborrheic keratosis: Secondary | ICD-10-CM | POA: Diagnosis not present

## 2018-04-10 DIAGNOSIS — J449 Chronic obstructive pulmonary disease, unspecified: Secondary | ICD-10-CM | POA: Diagnosis not present

## 2018-04-10 DIAGNOSIS — G4733 Obstructive sleep apnea (adult) (pediatric): Secondary | ICD-10-CM | POA: Diagnosis not present

## 2018-04-10 NOTE — Telephone Encounter (Signed)
Spoke with pt notifying him the letter is ready to pick up.  [Placed letter at front office.] 

## 2018-04-10 NOTE — Telephone Encounter (Signed)
Letter written and in Lisa's box.  

## 2018-04-20 DIAGNOSIS — G4733 Obstructive sleep apnea (adult) (pediatric): Secondary | ICD-10-CM | POA: Diagnosis not present

## 2018-04-26 ENCOUNTER — Ambulatory Visit
Admission: RE | Admit: 2018-04-26 | Discharge: 2018-04-26 | Disposition: A | Discharge status: Home / Self Care | Payer: PPO | Source: Ambulatory Visit | Attending: Orthopaedic Surgery | Admitting: Orthopaedic Surgery

## 2018-04-26 DIAGNOSIS — M25452 Effusion, left hip: Secondary | ICD-10-CM | POA: Diagnosis not present

## 2018-04-26 DIAGNOSIS — M25552 Pain in left hip: Secondary | ICD-10-CM

## 2018-05-04 ENCOUNTER — Other Ambulatory Visit: Payer: Self-pay | Admitting: Family Medicine

## 2018-05-04 NOTE — Telephone Encounter (Signed)
Gabapentin Last filled:  04/08/18 Last OV:  12/29/17, acute Next OV:  CPE

## 2018-05-09 ENCOUNTER — Encounter: Payer: Self-pay | Admitting: Family Medicine

## 2018-05-09 DIAGNOSIS — H353190 Nonexudative age-related macular degeneration, unspecified eye, stage unspecified: Secondary | ICD-10-CM | POA: Insufficient documentation

## 2018-05-09 DIAGNOSIS — H269 Unspecified cataract: Secondary | ICD-10-CM | POA: Insufficient documentation

## 2018-05-10 ENCOUNTER — Encounter: Payer: Self-pay | Admitting: Family Medicine

## 2018-05-10 DIAGNOSIS — R269 Unspecified abnormalities of gait and mobility: Secondary | ICD-10-CM | POA: Diagnosis not present

## 2018-05-10 DIAGNOSIS — J449 Chronic obstructive pulmonary disease, unspecified: Secondary | ICD-10-CM | POA: Diagnosis not present

## 2018-05-10 DIAGNOSIS — G4733 Obstructive sleep apnea (adult) (pediatric): Secondary | ICD-10-CM | POA: Diagnosis not present

## 2018-05-11 DIAGNOSIS — R3915 Urgency of urination: Secondary | ICD-10-CM | POA: Diagnosis not present

## 2018-05-11 DIAGNOSIS — N401 Enlarged prostate with lower urinary tract symptoms: Secondary | ICD-10-CM | POA: Diagnosis not present

## 2018-05-11 DIAGNOSIS — C672 Malignant neoplasm of lateral wall of bladder: Secondary | ICD-10-CM | POA: Diagnosis not present

## 2018-05-11 DIAGNOSIS — N3000 Acute cystitis without hematuria: Secondary | ICD-10-CM | POA: Diagnosis not present

## 2018-05-15 ENCOUNTER — Ambulatory Visit (INDEPENDENT_AMBULATORY_CARE_PROVIDER_SITE_OTHER): Payer: PPO | Admitting: Physician Assistant

## 2018-05-15 ENCOUNTER — Ambulatory Visit: Payer: PPO | Admitting: Surgery

## 2018-05-15 ENCOUNTER — Encounter (HOSPITAL_COMMUNITY): Payer: PPO

## 2018-05-15 ENCOUNTER — Encounter (INDEPENDENT_AMBULATORY_CARE_PROVIDER_SITE_OTHER): Payer: Self-pay | Admitting: Physician Assistant

## 2018-05-15 ENCOUNTER — Other Ambulatory Visit (INDEPENDENT_AMBULATORY_CARE_PROVIDER_SITE_OTHER): Payer: Self-pay

## 2018-05-15 ENCOUNTER — Encounter

## 2018-05-15 ENCOUNTER — Encounter: Payer: PPO | Admitting: Surgery

## 2018-05-15 DIAGNOSIS — M5417 Radiculopathy, lumbosacral region: Secondary | ICD-10-CM

## 2018-05-15 DIAGNOSIS — M25552 Pain in left hip: Secondary | ICD-10-CM

## 2018-05-15 NOTE — Progress Notes (Signed)
HPI: [name] returns today to go over the MRI of his right hip.  Since he was last seen he did undergo ABIs of the lower extremities which were within normal limits no significant arterial disease particularly on the left.  He continues to have pain lateral aspect of his right hip.  He does have some low back which she is seeing Dr. Ernestina Patches for in the past.  Last given the L5-S1 epidural steroid injection on the left by Dr. Ernestina Patches on 2018.  Describes the pain is burning sensation lateral aspect of his right hip.  Does have pain that goes down occasionally into his left lower leg.  Currently systemic right lower back pain.  Pain is constant worse with prolonged ambulation.  He said no fevers she does have occasional chills.  Does have a history of bladder cancer and recently had UTI took antibiotics on Wednesday Thursday Friday.  He also had a root canal 2 weeks ago was on antibiotics for this. MRI of his lumbar spine in 2017 showed a broad-based disc bulge which was eccentric towards the right also at L4-5 he had moderate bilateral facet arthropathy and mild to moderate spinal stenosis at the L4-5 level.  L5-S1 broad-based disc bulge eccentric to the left abutting the left intraspinal S1 nerve root. MRI of left hip is reviewed and shows no signs of loosening of the total hip components small left femoral neck joint effusion measuring 1.1 cm craniocaudal and 1.7 cm AP by 3.2 cm transverse is noted there is no synovitis associated with this.  Muscles and tendons about the left hip.  Normal and intact. Review of systems: Please see HPI otherwise negative  Physical exam: General well-developed well-nourished male no acute distress. Psych: Alert and oriented x3  Left hip good range of motion without pain.  Flexion of the left hip though causes some discomfort laterally and straight leg raise causes pain lateral aspect of the hip.  He has tenderness in the lower lumbar sacral paraspinous region on the left only.   5 out of 5 strength throughout lower extremities against resistance.  Impression: Left lateral hip pain  Plan: At this point time we will send him back to Dr. Ernestina Patches for possible epidural steroid injection on the left at L5 5 S1.  We will see him back in a month after this injection to see what type of response he had to wait until much of his hip pain is resolved.

## 2018-05-19 ENCOUNTER — Other Ambulatory Visit: Payer: Self-pay | Admitting: Family Medicine

## 2018-05-29 ENCOUNTER — Ambulatory Visit (INDEPENDENT_AMBULATORY_CARE_PROVIDER_SITE_OTHER): Payer: PPO | Admitting: Physical Medicine and Rehabilitation

## 2018-05-29 ENCOUNTER — Encounter (INDEPENDENT_AMBULATORY_CARE_PROVIDER_SITE_OTHER): Payer: Self-pay | Admitting: Physical Medicine and Rehabilitation

## 2018-05-29 ENCOUNTER — Ambulatory Visit (INDEPENDENT_AMBULATORY_CARE_PROVIDER_SITE_OTHER): Payer: Self-pay

## 2018-05-29 VITALS — BP 130/84 | HR 67 | Temp 97.4°F

## 2018-05-29 DIAGNOSIS — M5416 Radiculopathy, lumbar region: Secondary | ICD-10-CM

## 2018-05-29 DIAGNOSIS — M25552 Pain in left hip: Secondary | ICD-10-CM | POA: Diagnosis not present

## 2018-05-29 MED ORDER — BUPIVACAINE HCL 0.25 % IJ SOLN
4.0000 mL | INTRAMUSCULAR | Status: AC | PRN
  Administered 2018-05-29: 4 mL via INTRA_ARTICULAR

## 2018-05-29 MED ORDER — BETAMETHASONE SOD PHOS & ACET 6 (3-3) MG/ML IJ SUSP: 12.0000 mg | Freq: Once | INTRAMUSCULAR | Status: DC

## 2018-05-29 MED ORDER — TRIAMCINOLONE ACETONIDE 40 MG/ML IJ SUSP
80.0000 mg | INTRAMUSCULAR | Status: AC | PRN
  Administered 2018-05-29: 80 mg via INTRA_ARTICULAR

## 2018-05-29 MED ORDER — LIDOCAINE HCL 2 % IJ SOLN
4.0000 mL | INTRAMUSCULAR | Status: AC | PRN
  Administered 2018-05-29: 4 mL

## 2018-05-29 NOTE — Progress Notes (Signed)
Carl Lee - 62 y.o. male MRN [id number]  Date of birth: 12-Mar-1956  Office Visit Note: Visit Date: 05/29/2018 PCP: [name], MD Referred by: [name], MD  Subjective: Chief Complaint  Patient presents with  . Middle Back - Pain  . Lower Back - Pain  . Right Leg - Pain  . Left Hip - Pain   HPI: Carl Lee is a 62 y.o. male who comes in today For planned left L5 transforaminal epidural steroid injection.  Patient is someone well-known to me in the past who I initially saw for his lumbar spine and ultimately he was actually having more issues with his left hip and groin.  He has since had replacement by Dr. [name].  Is been followed by [name], P.A.-C with continued pain on the left lateral hip.  He is not had relief with greater trochanteric injection.  Carl Lee actually talk to me about the patient and we decided to complete left L5 transforaminal injection.  However, today on exam the patient is exquisitely tender over the greater trochanter.  He really almost dropped to the floor when I push over this area.  He is not tender up and down this area but right over the greater trochanter.  No pain with hip rotation.  He does report some back pain overall he does want to see me at some point for his low back which I would agree to see him obviously.  I think today the best approach is fluoroscopically guided left greater trochanteric injection.  Patient is not a large individual and I am certain that the greater trochanter injection was done accurately but sometimes with fluoroscopic imaging we can get this right over the greater trochanter and it can make a difference.  ROS Otherwise per HPI.  Assessment & Plan: Visit Diagnoses:  1. Lumbar radiculopathy   2. Greater trochanteric pain syndrome of left lower extremity     Plan: Findings:  During injection we had good fluoroscopic imaging with flow contrast outlining the bursa.  Patient had an extreme amount of concordant  pain at the site of needling into that area.  He did have decreased pain with the Marcaine post injection.    Meds & Orders:  Meds ordered this encounter  Medications  . DISCONTD: betamethasone acetate-betamethasone sodium phosphate (CELESTONE) injection 12 mg    Orders Placed This Encounter  Procedures  . Large Joint Inj: L greater trochanter  . XR C-ARM NO REPORT    Follow-up: Return if symptoms worsen or fail to improve, for Recheck spine.   Procedures: Large Joint Inj: L greater trochanter on 05/29/2018 3:12 PM Indications: pain and diagnostic evaluation Details: 22 G 3.5 in needle, fluoroscopy-guided lateral approach  Arthrogram: No  Medications: 4 mL lidocaine 2 %; 80 mg triamcinolone acetonide 40 MG/ML; 4 mL bupivacaine 0.25 % Outcome: tolerated well, no immediate complications  There was excellent flow of contrast outlined the greater trochanteric bursa without vascular uptake. Procedure, treatment alternatives, risks and benefits explained, specific risks discussed. Consent was given by the patient. Immediately prior to procedure a time out was called to verify the correct patient, procedure, equipment, support staff and site/side marked as required. Patient was prepped and draped in the usual sterile fashion.      No notes on file   Clinical History: MRI LUMBAR SPINE WITHOUT CONTRAST  TECHNIQUE: Multiplanar, multisequence MR imaging of the lumbar spine was performed. No intravenous contrast was administered.  COMPARISON:  None.  FINDINGS: Severe  susceptibility artifact resulting from the aorto bi-iliac stent graft which obscures portions of the lumbar spine suspect she on the STIR images.  Segmentation:  Standard.  Alignment:  Physiologic.  Vertebrae: Limited evaluation secondary to cystitis did of the artifact. No fracture, evidence of discitis, or bone lesion.  Conus medullaris: Extends to the L1 level and appears normal.  Paraspinal and  other soft tissues: Severe susceptibility artifact resulting from the aorto bi-iliac stent graft .  Disc levels:  Disc spaces: Disc heights are relatively well maintained, but evaluation is limited.  T12-L1: No significant disc protrusion. No evidence of neural foraminal stenosis. No central canal stenosis.  L1-L2: Mild broad-based disc bulge. Mild bilateral facet arthropathy. No evidence of neural foraminal stenosis. No central canal stenosis.  L2-L3: Small central disc protrusion. Mild bilateral facet arthropathy. No evidence of neural foraminal stenosis. No central canal stenosis.  L3-L4: Moderate broad-based disc bulge flattening the ventral thecal sac. Mild bilateral facet arthropathy. Mild spinal stenosis. No foraminal stenosis.  L4-L5: Moderate broad-based disc bulge eccentric towards the right. Moderate bilateral facet arthropathy. Mild -moderate spinal stenosis. No evidence of neural foraminal stenosis.  L5-S1: Broad-based disc bulge with a left paracentral disc protrusion abutting the left intraspinal S1 nerve root. No evidence of neural foraminal stenosis. No central canal stenosis.  IMPRESSION: 1. Lumbar spine spondylosis as described above. Limitations as described above secondary to susceptibility artifact resulting from the aortic stent graft. 2. At L4-5 there is a moderate broad-based disc bulge eccentric towards the right. Moderate bilateral facet arthropathy. Mild -moderate spinal stenosis. 3. At L5-S1 there is a broad-based disc bulge with a left paracentral disc protrusion abutting the left intraspinal S1 nerve root.   Electronically Signed   By: Kathreen Devoid   On: 11/27/2015 12:13   He reports that he quit smoking about 4 years ago. His smoking use included cigarettes. He has a 40.00 pack-year smoking history. He has never used smokeless tobacco.  Recent Labs    06/29/17 0955 12/28/17 0827  HGBA1C 6.4 6.3    Objective:  VS:  HT:     WT:   BMI:     BP:130/84  HR:67bpm  TEMP:(!) 97.4 F (36.3 C)(Oral)  RESP:  Physical Exam  Ortho Exam Imaging: Xr C-arm No Report  Result Date: 05/29/2018 Please see Notes tab for imaging impression.   Past Medical/Family/Surgical/Social History: Medications & Allergies reviewed per EMR, new medications updated. Patient Active Problem List   Diagnosis Date Noted  . Cataracts, bilateral 05/09/2018  . Macular degeneration, dry 05/09/2018  . Bilateral hearing loss 12/29/2017  . Chronic foot pain 12/29/2017  . Acute cystitis 11/24/2017  . History of colonic polyps   . Diverticulosis of colon without hemorrhage   . Urinary frequency 06/30/2017  . Status post total replacement of left hip 03/08/2017  . Abnormal nuclear stress test   . Overweight (BMI 25.0-29.9) 01/26/2017  . Radiculopathy due to lumbar intervertebral disc disorder 10/21/2016  . Osteoarthritis of left hip 08/16/2016  . Atrial tachycardia, paroxysmal ([place]) 06/18/2016  . Chronic fatigue 04/27/2016  . Coronary artery disease   . Abdominal discomfort 06/24/2015  . Hepatic steatosis 06/12/2015  . Right trigeminal neuralgia 11/27/2014  . Chronic chest pain 10/30/2014  . Vitamin B12 deficiency   . OSA (obstructive sleep apnea) 09/04/2014  . Headache 06/20/2014  . Prediabetes 06/08/2014  . Zenker diverticulum 04/19/2014  . COPD, moderate ([place]) 03/26/2014  . S/P CABG x 3 02/07/2014  . Occlusion and stenosis of vertebral artery  without mention of cerebral infarction 11/12/2013  . Skin rash 04/17/2013  . Healthcare maintenance 01/09/2013  . Dyslipidemia 01/09/2013  . Lumbosacral radiculopathy at S1 09/14/2012  . Hx of migraines   . Arthritis   . History of repair of aneurysm of abdominal aorta using endovascular stent graft 05/01/2012  . Adenomatous polyps 03/24/2012  . Ex-smoker 03/24/2012  . Hypertension 03/24/2012  . History of bladder cancer 03/24/2012   Past Medical History:  Diagnosis Date  . AAA  (abdominal aortic aneurysm) ([place]) 03/25/12   s/p endovascular repair  . Adenomatous polyp 06/2009  . Atrial tachycardia, paroxysmal ([place]) 06/18/2016  . Bladder cancer ([place]) 08/16/2008   recurrence 2015 and 2018 treated with office fulguration  . CAD (coronary artery disease), native coronary artery    S/p 3v CABG 01/2014   . Childhood asthma    as a child  . Complication of anesthesia    "takes me a year to get over"  . Contact dermatitis    atypical [name])  . COPD (chronic obstructive pulmonary disease) ([place])   . Coronary artery disease    a.2015: CABG w/ LIMA-LAD, RIMA-OM, SVG-RCA b. Cath on 07/10/15 w/ patent LIMA-LAD and RIMA-OM. SVG-RCA occluded but collaterals present  . Diverticulosis 2013   severe by CT and colonoscopy  . Environmental allergies    improved as ages  . Fracture of lateral malleolus of left ankle 08/29/2013  . Headache    hasn't had migraines in years  . Hepatic steatosis 06/2015   by [place]  . Hx of migraines   . Hypertension   . Lumbosacral radiculopathy at S1 03/2012   left, with spinal stenosis (MRI 04/2013) improved with TF ESI L5/S1 and S1/2 ([place])  . OSA (obstructive sleep apnea) 09/04/2014   Severe with AHI 35/hr, uses BIPAP  . Overweight (BMI 25.0-29.9) 01/26/2017  . Pre-diabetes   . Prediabetes 06/08/2014   CAD   . Resistance to clopidogrel 2015   drug metabolism panel run - asked to scan  . Spinal stenosis    LS1 nerve root impingement from bulging disc  . Vitamin B12 deficiency   . Zenker diverticulum    Family History  Problem Relation Age of Onset  . Diabetes Mother   . Arrhythmia Mother        pacemaker  . Cancer Mother        Bladder  . COPD Mother   . Thyroid disease Mother   . Hyperlipidemia Mother   . Hypertension Mother   . Diabetes Father   . CAD Father 4       CHF, MI  . Dementia Father   . Heart attack Father   . Diabetes Sister   . Heart attack Maternal Grandmother   . AAA (abdominal aortic aneurysm)  Maternal Grandmother   . Colon cancer Neg Hx    Past Surgical History:  Procedure Laterality Date  . ABDOMINAL AORTIC ANEURYSM REPAIR  03/25/12   endovascular  . ABI  05/2013   WNL, L TBI low at 0.66  . Bladder cancer  March 2010 and Oct. 2015   Ernst Spell) x 2  . CARDIAC CATHETERIZATION N/A 07/10/2015   Procedure: Left Heart Cath and Cors/Grafts Angiography;  Surgeon: [name], MD;  Location: [facility];  Service: Cardiovascular;  Laterality: N/A;  . COLONOSCOPY  06/2012   hyperplastic polyp, diverticulosis ([place]) rec rpt 5 yrs  . COLONOSCOPY WITH PROPOFOL N/A 08/10/2017   TA, rpt 5 yrs Ardis Hughs, Melene Plan, MD)  .  CORONARY ARTERY BYPASS GRAFT N/A 02/07/2014   Procedure: CORONARY ARTERY BYPASS GRAFTING (CABG);  Surgeon: [name], MD;  Location: [facility];  Service: Open Heart Surgery;  Laterality: N/A;  CABG X 3, BILATERAL LIMA, EVH  . CYSTOSCOPY  08/16/08   Bladder Cancer  . EPIDURAL BLOCK INJECTION Left 09/2014, 10/2014, 12/2014   medial L2,3,4, dorsal L5 ramus blocks x2, L L5/S1 and S1/2 transforaminal ESI ([place])  . ESI  04/2013, 06/2013   L L5S1, S12 transforaminal ESI (Dr.  [name])  . ESI Left 04/2014, 05/2014, 06/2014   L5/S1, S1/2; rpt; L4/5  . ESI  03/2016   R L5/S1 interlaminar ESI  . ESOPHAGEAL MANOMETRY N/A 08/10/2017   Procedure: ESOPHAGEAL MANOMETRY (EM);  Surgeon: [name], MD;  Location: [facility];  Service: Endoscopy;  Laterality: N/A;  . ESOPHAGOGASTRODUODENOSCOPY (EGD) WITH PROPOFOL N/A 08/10/2017   Procedure: ESOPHAGOGASTRODUODENOSCOPY (EGD) WITH PROPOFOL;  Surgeon: [name], MD;  Location: [facility];  Service: Endoscopy;  Laterality: N/A;  . FULGURATION OF BLADDER TUMOR  01/2017   recurrent 43mm L lateral wall papillary transitional cell carcinoma ([place])  . INTRAOPERATIVE TRANSESOPHAGEAL ECHOCARDIOGRAM N/A 02/07/2014   Procedure: INTRAOPERATIVE TRANSESOPHAGEAL ECHOCARDIOGRAM;  Surgeon: [name], MD;  Location: [facility];  Service: Open Heart Surgery;  Laterality: N/A;  . LEFT HEART CATH AND CORS/GRAFTS ANGIOGRAPHY N/A 02/11/2017   Procedure: LEFT HEART CATH AND CORS/GRAFTS ANGIOGRAPHY;  Surgeon: [name], MD;  Location: [facility];  Service: Cardiovascular;  Laterality: N/A;  . LEFT HEART CATHETERIZATION WITH CORONARY ANGIOGRAM N/A 02/01/2014   Procedure: LEFT HEART CATHETERIZATION WITH CORONARY ANGIOGRAM;  Surgeon: [name], MD;  Location[facility];  Service: Cardiovascular;  Laterality: N/A;  . PRP epidural injection Left 11/2015   L5/S1, S1/2 transforaminal epidural PRP injections under fluoroscopy (Carl Lee)  . TONSILLECTOMY AND ADENOIDECTOMY  1973  . TOTAL HIP ARTHROPLASTY Left 03/08/2017  . TOTAL HIP ARTHROPLASTY Left 03/08/2017   Procedure: LEFT TOTAL HIP ARTHROPLASTY ANTERIOR APPROACH;  Surgeon: [name], MD;  Location: [facility];  Service: Orthopedics;  Laterality: Left;  [name] VASECTOMY     Social History   Occupational History  . Occupation: Self Employed  Tobacco Use  . Smoking status: Former Smoker    Packs/day: 1.00    Years: 40.00    Pack years: 40.00    Types: Cigarettes    Last attempt to quit: 02/05/2014    Years since quitting: 4.3  . Smokeless tobacco: Never Used  Substance and Sexual Activity  . Alcohol use: Yes    Alcohol/week: 0.0 standard drinks    Comment: Rare- states 1 oz. per week  . Drug use: No  . Sexual activity: Not on file

## 2018-05-29 NOTE — Progress Notes (Signed)
 .  Numeric Pain Rating Scale and Functional Assessment Average Pain 6   In the last MONTH (on 0-10 scale) has pain interfered with the following?  1. General activity like being  able to carry out your everyday physical activities such as walking, climbing stairs, carrying groceries, or moving a chair?  Rating(4)   +Driver, -BT, -Dye Allergies.  

## 2018-05-29 NOTE — Patient Instructions (Signed)

## 2018-06-05 ENCOUNTER — Ambulatory Visit (INDEPENDENT_AMBULATORY_CARE_PROVIDER_SITE_OTHER): Payer: PPO | Admitting: Physician Assistant

## 2018-06-08 ENCOUNTER — Other Ambulatory Visit: Payer: Self-pay | Admitting: Family Medicine

## 2018-06-10 DIAGNOSIS — G4733 Obstructive sleep apnea (adult) (pediatric): Secondary | ICD-10-CM | POA: Diagnosis not present

## 2018-06-10 DIAGNOSIS — J449 Chronic obstructive pulmonary disease, unspecified: Secondary | ICD-10-CM | POA: Diagnosis not present

## 2018-06-10 DIAGNOSIS — R269 Unspecified abnormalities of gait and mobility: Secondary | ICD-10-CM | POA: Diagnosis not present

## 2018-06-12 NOTE — Progress Notes (Signed)
Cardiology Office Note:    Date:  06/13/2018   ID:  Josafat, Carl Lee 01-Apr-1956, MRN [id number]  PCP:  [name], MD  Cardiologist:  Dr. [name]   Electrophysiologist:  N/a Pulmonology: Dr. [name] GI: Dr. Ardis Hughs  Referring MD: [name], MD   No chief complaint on file.   History of Present Illness:    EDOUARD GIKAS is a 62 y.o. male with a hx of CAD s/p CABG in 2015 with Dr. [name] (L-LAD, RIMA-OM, S-PDA), recurrent bladder CA, carotid artery disease, AAA s/p endovascular repair in 9/13 by Dr. [name], HTN, HL, DM2, OSA, tobacco abuse.   Had cath 02/11/17 for preop right hip surgery clearance. Stable with patent LIMA to mid LAD, RIMA to OM2 Occluded SVG PDA native RCA fills bridging collaterals EF 45-50% EF was normal by TTE done 11/11/15  Uneventful right THR Dr [name] 03/09/17 Continued left hip pain with L5 transforaminal injection on 05/29/18  VVS follows PVD with last [place] 11/24/15 residual AAA 4.0 cm   Dr Radford Pax follows his CPAP   He continues to have multiple types of chest pain One is clearly chest wall But does get pain When fishing or exerting himself. Intolerant to imdur with bad headache Gave it a good 3 week Trial and headache did not improve       Prior CV studies that were reviewed today include:    Holter Monitor:  Reviewed 03/25/16   SR average HR 72 Isolated PAC;s / PVC;s Some short runs 10-15 beats atrial tachycardia  Echo 11/11/15 Vigorous LVF, EF 65-70%, no RWMA, Gr 1 DD, reduced RVSF, PASP 24 mmHg  24 Hr BP Monitor 4/17 Avg BP in normal range  LHC 02/11/17 reviewed Conclusion     Prox RCA lesion, 100 %stenosed.  Origin to Prox Graft lesion, 100 %stenosed.  Ost LAD to Prox LAD lesion, 100 %stenosed.  LIMA and is normal in caliber.  3rd Mrg lesion, 40 %stenosed.  RIMA and is normal in caliber.  Ost Cx lesion, 55 %stenosed.  Prox Cx lesion, 20 %stenosed.  There is mild left ventricular systolic  dysfunction.   Mild LV dysfunction with an ejection fraction of 45-50% with mild mid to basal inferior hypocontractility and a nipple appearing focal apical aneurysm.  Significant native CAD with total occlusion of the proximal LAD after a prominent septal perforating artery; 55% stenosis prior to an ectatic aneurysmal segment in the proximal circumflex 20% mild narrowing between the OM1 and OM 2 with retrograde filling into the OM 2 via the RIMA graft; and total occlusion of the proximal RCA with bridging collaterals to the mid RCA and collaterals arising from the distal circumflex and septal perforating artery of the LAD supplying the very distal branch vessels.  Patent LIMA graft supplying the mid LAD.  Patent RIMA graft supplying the OM 2 vessel of the circumflex  Old occluded vein graft, which had supplied the PDA region.  RECOMMENDATION: Medical therapy.  The patient will return to the care of Dr. Roosvelt Harps in [place].  Based on his anatomy, which essentially is unchanged from 2 years ago,  cardiac clearance can be given for his hip surgery.         Carotid US (7/15):  Bilateral ICA 1-39%  Past Medical History:  Diagnosis Date  . AAA (abdominal aortic aneurysm) ([place]) 03/25/12   s/p endovascular repair  . Adenomatous polyp 06/2009  . Atrial tachycardia, paroxysmal ([place]) 06/18/2016  . Bladder cancer ([place]) 08/16/2008   recurrence  2015 and 2018 treated with office fulguration  . CAD (coronary artery disease), native coronary artery    S/p 3v CABG 01/2014   . Childhood asthma    as a child  . Complication of anesthesia    "takes me a year to get over"  . Contact dermatitis    atypical [name])  . COPD (chronic obstructive pulmonary disease) ([place])   . Coronary artery disease    a.2015: CABG w/ LIMA-LAD, RIMA-OM, SVG-RCA b. Cath on 07/10/15 w/ patent LIMA-LAD and RIMA-OM. SVG-RCA occluded but collaterals present  . Diverticulosis 2013   severe by CT and colonoscopy  .  Environmental allergies    improved as ages  . Fracture of lateral malleolus of left ankle 08/29/2013  . Headache    hasn't had migraines in years  . Hepatic steatosis 06/2015   by [place]  . Hx of migraines   . Hypertension   . Lumbosacral radiculopathy at S1 03/2012   left, with spinal stenosis (MRI 04/2013) improved with TF ESI L5/S1 and S1/2 ([place])  . OSA (obstructive sleep apnea) 09/04/2014   Severe with AHI 35/hr, uses BIPAP  . Overweight (BMI 25.0-29.9) 01/26/2017  . Pre-diabetes   . Prediabetes 06/08/2014   CAD   . Resistance to clopidogrel 2015   drug metabolism panel run - asked to scan  . Spinal stenosis    LS1 nerve root impingement from bulging disc  . Vitamin B12 deficiency   . Zenker diverticulum     Past Surgical History:  Procedure Laterality Date  . ABDOMINAL AORTIC ANEURYSM REPAIR  03/25/12   endovascular  . ABI  05/2013   WNL, L TBI low at 0.66  . Bladder cancer  March 2010 and Oct. 2015   Ernst Spell) x 2  . CARDIAC CATHETERIZATION N/A 07/10/2015   Procedure: Left Heart Cath and Cors/Grafts Angiography;  Surgeon: [name], MD;  Location: [facility];  Service: Cardiovascular;  Laterality: N/A;  . COLONOSCOPY  06/2012   hyperplastic polyp, diverticulosis (jac[place]) rec rpt 5 yrs  . COLONOSCOPY WITH PROPOFOL N/A 08/10/2017   TA, rpt 5 yrs Ardis Hughs, Melene Plan, MD)  . CORONARY ARTERY BYPASS GRAFT N/A 02/07/2014   Procedure: CORONARY ARTERY BYPASS GRAFTING (CABG);  Surgeon: [name], MD;  Location: [facility];  Service: Open Heart Surgery;  Laterality: N/A;  CABG X 3, BILATERAL LIMA, EVH  . CYSTOSCOPY  08/16/08   Bladder Cancer  . EPIDURAL BLOCK INJECTION Left 09/2014, 10/2014, 12/2014   medial L2,3,4, dorsal L5 ramus blocks x2, L L5/S1 and S1/2 transforaminal ESI ([place])  . ESI  04/2013, 06/2013   L L5S1, S12 transforaminal ESI (Dr.  [name])  . ESI Left 04/2014, 05/2014, 06/2014   L5/S1, S1/2; rpt; L4/5  . ESI  03/2016   R  L5/S1 interlaminar ESI  . ESOPHAGEAL MANOMETRY N/A 08/10/2017   Procedure: ESOPHAGEAL MANOMETRY (EM);  Surgeon: [name], MD;  Location: [facility];  Service: Endoscopy;  Laterality: N/A;  . ESOPHAGOGASTRODUODENOSCOPY (EGD) WITH PROPOFOL N/A 08/10/2017   Procedure: ESOPHAGOGASTRODUODENOSCOPY (EGD) WITH PROPOFOL;  Surgeon: [name], MD;  Location: [facility];  Service: Endoscopy;  Laterality: N/A;  . FULGURATION OF BLADDER TUMOR  01/2017   recurrent 40mm L lateral wall papillary transitional cell carcinoma ([place])  . INTRAOPERATIVE TRANSESOPHAGEAL ECHOCARDIOGRAM N/A 02/07/2014   Procedure: INTRAOPERATIVE TRANSESOPHAGEAL ECHOCARDIOGRAM;  Surgeon: [name], MD;  Location: [facility];  Service: Open Heart Surgery;  Laterality: N/A;  . LEFT  HEART CATH AND CORS/GRAFTS ANGIOGRAPHY N/A 02/11/2017   Procedure: LEFT HEART CATH AND CORS/GRAFTS ANGIOGRAPHY;  Surgeon: [name], MD;  Location: [facility];  Service: Cardiovascular;  Laterality: N/A;  . LEFT HEART CATHETERIZATION WITH CORONARY ANGIOGRAM N/A 02/01/2014   Procedure: LEFT HEART CATHETERIZATION WITH CORONARY ANGIOGRAM;  Surgeon: [name], MD;  Location: [facility];  Service: Cardiovascular;  Laterality: N/A;  . PRP epidural injection Left 11/2015   L5/S1, S1/2 transforaminal epidural PRP injections under fluoroscopy ([place])  . TONSILLECTOMY AND ADENOIDECTOMY  1973  . TOTAL HIP ARTHROPLASTY Left 03/08/2017  . TOTAL HIP ARTHROPLASTY Left 03/08/2017   Procedure: LEFT TOTAL HIP ARTHROPLASTY ANTERIOR APPROACH;  Surgeon: [name], MD;  Location: [facility];  Service: Orthopedics;  Laterality: Left;  [name] VASECTOMY      Current Medications: Current Meds  Medication Sig  . acetaminophen (TYLENOL) 325 MG tablet Take 650 mg by mouth every 6 (six) hours as needed for moderate pain or headache.  . albuterol (PROAIR HFA) 108 (90 Base) MCG/ACT inhaler Inhale 2 puffs into the lungs every 6 (six)  hours as needed for wheezing or shortness of breath.  [name] albuterol (PROVENTIL) (2.5 MG/3ML) 0.083% nebulizer solution Take 3 mLs (2.5 mg total) by nebulization every 6 (six) hours as needed for wheezing or shortness of breath.  [name] amLODipine (NORVASC) 5 MG tablet TAKE 1 TABLET BY MOUTH EVERY DAY  . aspirin EC 325 MG EC tablet Take 1 tablet (325 mg total) by mouth daily.  [name] atorvastatin (LIPITOR) 20 MG tablet TAKE 1 TABLET (20 MG TOTAL) BY MOUTH DAILY AT 6 PM.  . famotidine (PEPCID) 20 MG tablet TAKE 1 TABLET (20 MG TOTAL) BY MOUTH 2 (TWO) TIMES DAILY.  [name] gabapentin (NEURONTIN) 300 MG capsule TAKE 1 CAPSULE (300 MG TOTAL) BY MOUTH 2 (TWO) TIMES DAILY. FIRST 3 DAYS TAKE ONCE NIGHTLY  . glucose blood (ONE TOUCH ULTRA TEST) test strip CHECK ONCE DAILY AND AS NEEDED  . metoprolol tartrate (LOPRESSOR) 25 MG tablet Take 0.5 tablets (12.5 mg total) by mouth 2 (two) times daily.  [name] Propylene Glycol (SYSTANE BALANCE) 0.6 % SOLN Place 1 drop into both eyes daily as needed (for dry eyes).  [name] umeclidinium-vilanterol (ANORO ELLIPTA) 62.5-25 MCG/INH AEPB Inhale 1 puff into the lungs daily.  . vitamin B-12 (CYANOCOBALAMIN) 500 MCG tablet Take 1 tablet (500 mcg total) by mouth every other day.     Allergies:   Codeine and Doxycycline   Social History   Socioeconomic History  . Marital status: Married    Spouse name: Not on file  . Number of children: 2  . Years of education: Not on file  . Highest education level: Not on file  Occupational History  . Occupation: Self Employed  Social Needs  . Financial resource strain: Not on file  . Food insecurity:    Worry: Not on file    Inability: Not on file  . Transportation needs:    Medical: Not on file    Non-medical: Not on file  Tobacco Use  . Smoking status: Former Smoker    Packs/day: 1.00    Years: 40.00    Pack years: 40.00    Types: Cigarettes    Last attempt to quit: 02/05/2014    Years since quitting: 4.3  . Smokeless tobacco: Never Used   Substance and Sexual Activity  . Alcohol use: Yes    Alcohol/week: 0.0 standard drinks    Comment: Rare- states 1 oz.  per week  . Drug use: No  . Sexual activity: Not on file  Lifestyle  . Physical activity:    Days per week: Not on file    Minutes per session: Not on file  . Stress: Not on file  Relationships  . Social connections:    Talks on phone: Not on file    Gets together: Not on file    Attends religious service: Not on file    Active member of club or organization: Not on file    Attends meetings of clubs or organizations: Not on file    Relationship status: Not on file  Other Topics Concern  . Not on file  Social History Narrative   Caffeine: 1 cup coffee/day   Lives with wife, 1 dog   grown children   Occupation: Therapist, sports - self employed.  Disability after CABG   Edu: 2 yrs college   Activity: fishing, walking occasionally   Diet: good water, fruits/vegetables daily     Family History:  The patient's family history includes AAA (abdominal aortic aneurysm) in his maternal grandmother; Arrhythmia in his mother; CAD (age of onset: 86) in his father; COPD in his mother; Cancer in his mother; Dementia in his father; Diabetes in his father, mother, and sister; Heart attack in his father and maternal grandmother; Hyperlipidemia in his mother; Hypertension in his mother; Thyroid disease in his mother.   ROS:   Please see the history of present illness.    Review of Systems  Constitution: Positive for malaise/fatigue.  Eyes: Positive for visual disturbance.  Cardiovascular: Positive for chest pain, dyspnea on exertion and irregular heartbeat.  Respiratory: Positive for wheezing.   Hematologic/Lymphatic: Bruises/bleeds easily.  Skin: Positive for rash.  Musculoskeletal: Positive for back pain.   All other systems reviewed and are negative.   EKGs/Labs/Other Test Reviewed:    EKG:  11/10/17 SR rate 61 nonspecific ST changes   Recent Labs: 06/29/2017:  Hemoglobin 15.9; Platelets 248.0 12/28/2017: BUN 19; Creatinine, Ser 0.77; Potassium 4.0; Sodium 138   Recent Lipid Panel    Component Value Date/Time   CHOL 103 06/29/2017 0955   CHOL 170 01/05/2012   TRIG 63.0 06/29/2017 0955   TRIG 141 01/05/2012   HDL 43.20 06/29/2017 0955   CHOLHDL 2 06/29/2017 0955   VLDL 12.6 06/29/2017 0955   LDLCALC 47 06/29/2017 0955   LDLDIRECT 62.0 12/28/2017 0827     Physical Exam:    VS:  BP 116/70   Pulse (!) 52   Ht 5\' 8"  (1.727 m)   Wt 210 lb 6.4 oz (95.4 kg)   SpO2 92%   BMI 31.99 kg/m     Wt Readings from Last 3 Encounters:  06/13/18 210 lb 6.4 oz (95.4 kg)  02/07/18 211 lb 12.8 oz (96.1 kg)  02/01/18 211 lb (95.7 kg)     Affect appropriate Overweight male  HEENT: normal Neck supple with no adenopathy JVP normal no bruits no thyromegaly Lungs clear with no wheezing and good diaphragmatic motion Heart:  S1/S2 no murmur, no rub, gallop or click PMI normal Abdomen: benighn, BS positve, no tenderness, no AAA no bruit.  No HSM or HJR Distal pulses intact with no bruits No edema Neuro non-focal Skin warm and dry No muscular weakness Post left THR     ASSESSMENT:    1. Coronary artery disease involving coronary bypass graft of native heart, angina presence unspecified   2. Essential hypertension   3. Chest pain, unspecified type  4. S/P AAA (abdominal aortic aneurysm) repair    PLAN:    In order of problems listed above:  1. CAD - s/p CABG. Cath 02/2017 stab le anatomy Continues to have chest pain ? Insufficient Collaterals to RCA. Intolerant to imdur due to headache Try Ranexa 500 bid. Also try capzasin cream to chest at night for chest wall pain Do not think he needs another heart cath at this time   2. HTN - Well controlled.  Continue current medications and low sodium Dash type diet.    3. HL - Continue statin. Labs with primary   4. AAA s/p Endovascular repair - FU with VVS.    5. Fatigue - This is a chronic  issue. Labs including TSH ok .  6. Palpitations - Holter 03/2016 PAC;s/ PVC;s benign   7. Zenker's Diverticulum - F/U with Dr Ardis Hughs   8. OSA:  CPAP titration per Dr Radford Pax compliant   9. Ortho:  Post left THR with bursitis continue PT/OT and analgesics   10/  Dyspnea:  EF 45-50% cath 02/11/17 LVEDP only 3 and post A wave 16 mmHg  Last TTE 11/11/15 EF 65-70% will update TTE for EF     [name] MD [facility]

## 2018-06-13 ENCOUNTER — Telehealth (INDEPENDENT_AMBULATORY_CARE_PROVIDER_SITE_OTHER): Payer: Self-pay | Admitting: Physical Medicine and Rehabilitation

## 2018-06-13 ENCOUNTER — Encounter: Payer: Self-pay | Admitting: Cardiovascular Disease

## 2018-06-13 ENCOUNTER — Ambulatory Visit: Payer: PPO | Admitting: Cardiovascular Disease

## 2018-06-13 VITALS — BP 116/70 | HR 52 | Ht 68.0 in | Wt 210.4 lb

## 2018-06-13 DIAGNOSIS — I10 Essential (primary) hypertension: Secondary | ICD-10-CM

## 2018-06-13 DIAGNOSIS — R079 Chest pain, unspecified: Secondary | ICD-10-CM

## 2018-06-13 DIAGNOSIS — Z9889 Other specified postprocedural states: Secondary | ICD-10-CM | POA: Diagnosis not present

## 2018-06-13 DIAGNOSIS — I25810 Atherosclerosis of coronary artery bypass graft(s) without angina pectoris: Secondary | ICD-10-CM

## 2018-06-13 DIAGNOSIS — Z8679 Personal history of other diseases of the circulatory system: Secondary | ICD-10-CM | POA: Diagnosis not present

## 2018-06-13 MED ORDER — RANOLAZINE ER 500 MG PO TB12: 500.0000 mg | ORAL_TABLET | Freq: Two times a day (BID) | ORAL | 3 refills | Status: DC

## 2018-06-13 NOTE — Telephone Encounter (Signed)
L5 TF esi to side that hurts, may need updated MRI at some point, I didn't check insurance but facet block may be possible

## 2018-06-13 NOTE — Telephone Encounter (Signed)
Scheduled for 12/17 at 1445.

## 2018-06-13 NOTE — Patient Instructions (Addendum)
Medication Instructions:  1-START Ranexa 500 mg by mouth twice daily.  If you need a refill on your cardiac medications before your next appointment, please call your pharmacy.   Lab work:  If you have labs (blood work) drawn today and your tests are completely normal, you will receive your results only by: [name] MyChart Message (if you have MyChart) OR . A paper copy in the mail If you have any lab test that is abnormal or we need to change your treatment, we will call you to review the results.  Testing/Procedures: NONE ordered today.  Follow-Up: At [facility], you and your health needs are our priority.  As part of our continuing mission to provide you with exceptional heart care, we have created designated Provider Care Teams.  These Care Teams include your primary Cardiologist (physician) and Advanced Practice Providers (APPs -  Physician Assistants and Nurse Practitioners) who all work together to provide you with the care you need, when you need it. You will need a follow up appointment in 3 months.  You may see [name], MD or one of the following Advanced Practice Providers on your designated Care Team:   Truitt Merle, NP Cecilie Kicks, NP . Kathyrn Drown, NP

## 2018-06-13 NOTE — Telephone Encounter (Signed)
Left message for patient to call back to discuss/ schedule. 

## 2018-06-14 IMAGING — CR DG CHEST 2V
2 series · 2 of 2 positions shown · non-contrast
Comparison: Radiographs March 19, 2016.

CLINICAL DATA: Cough, shortness of breath, chest pain.

EXAM:
CHEST - 2 VIEW

[w chest pa]
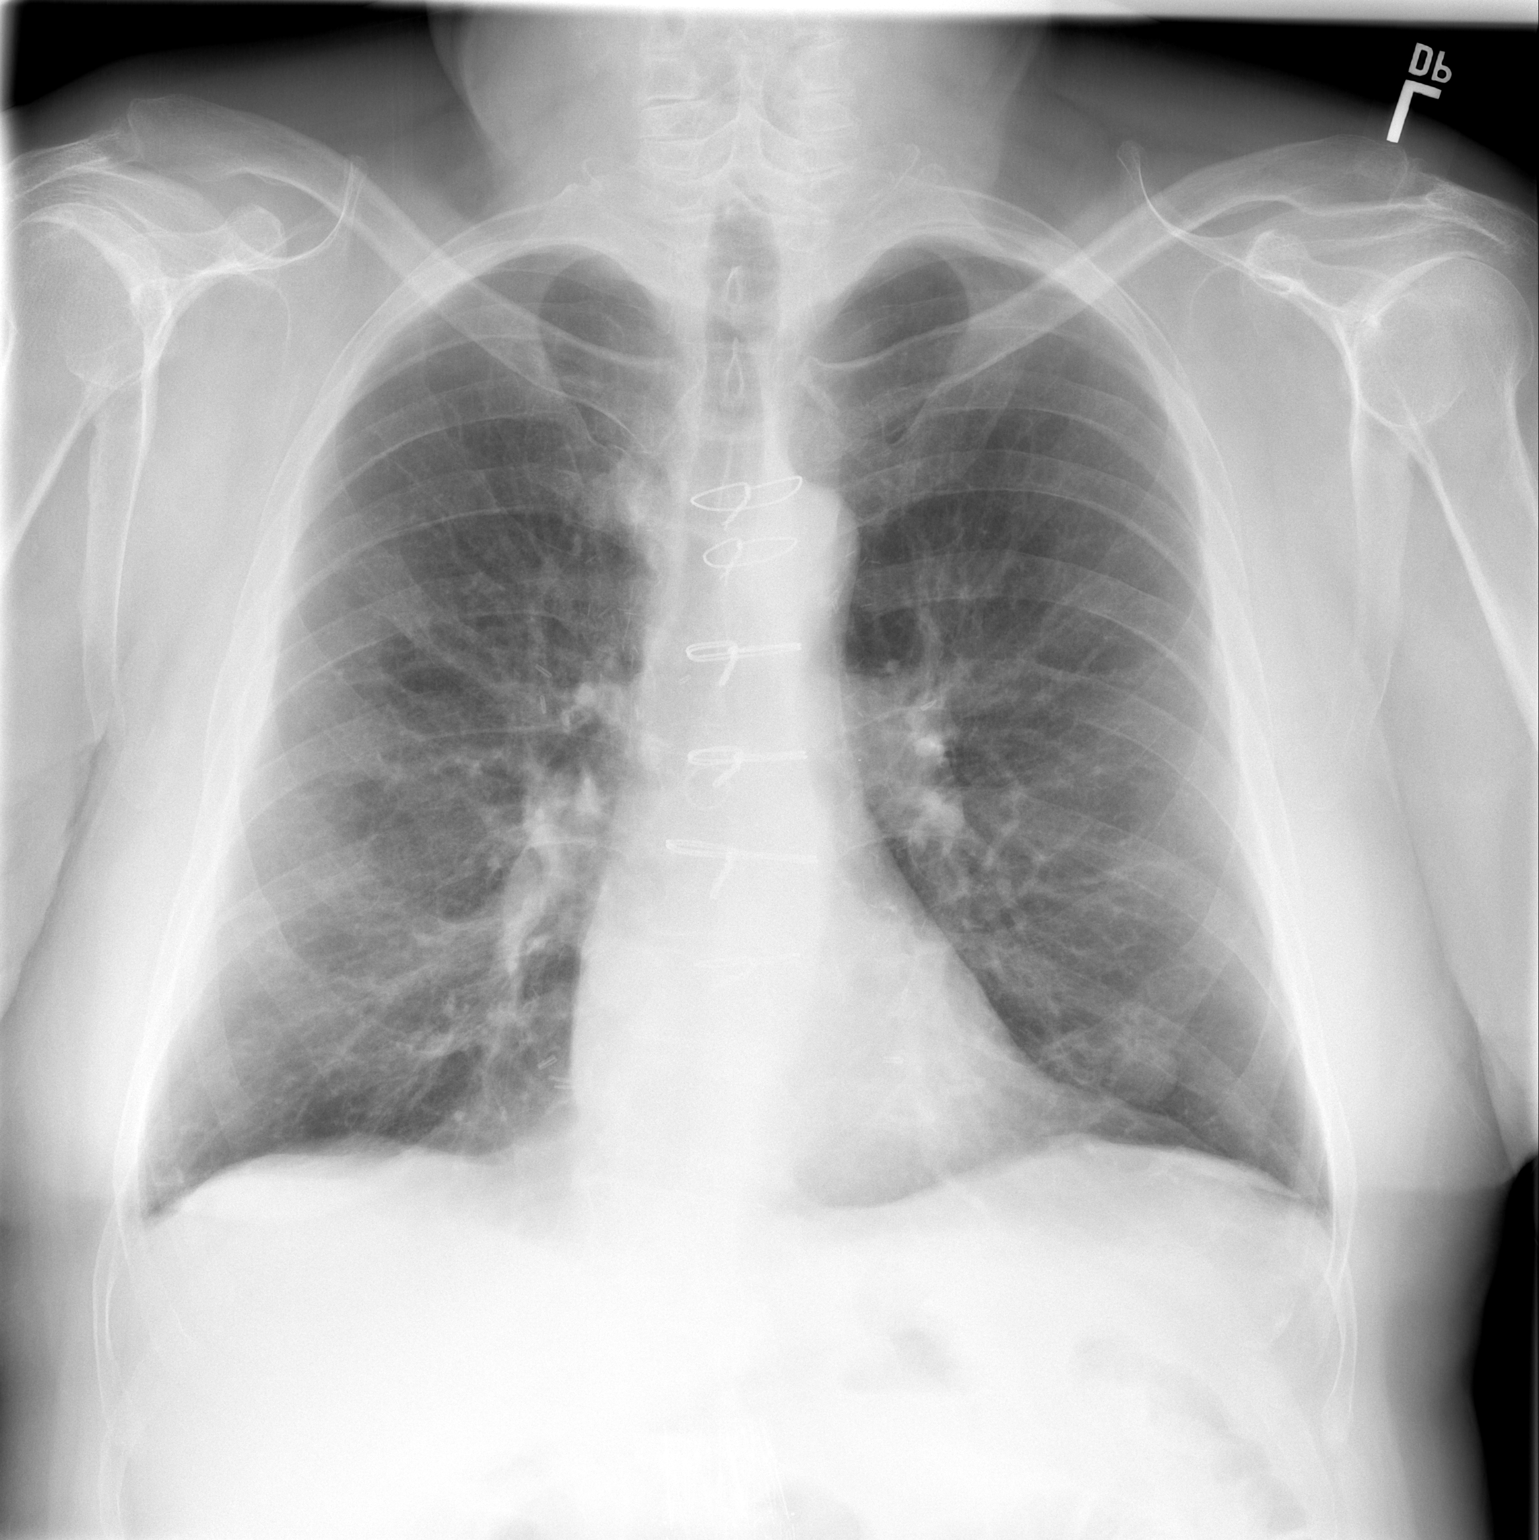

[w chest lat]
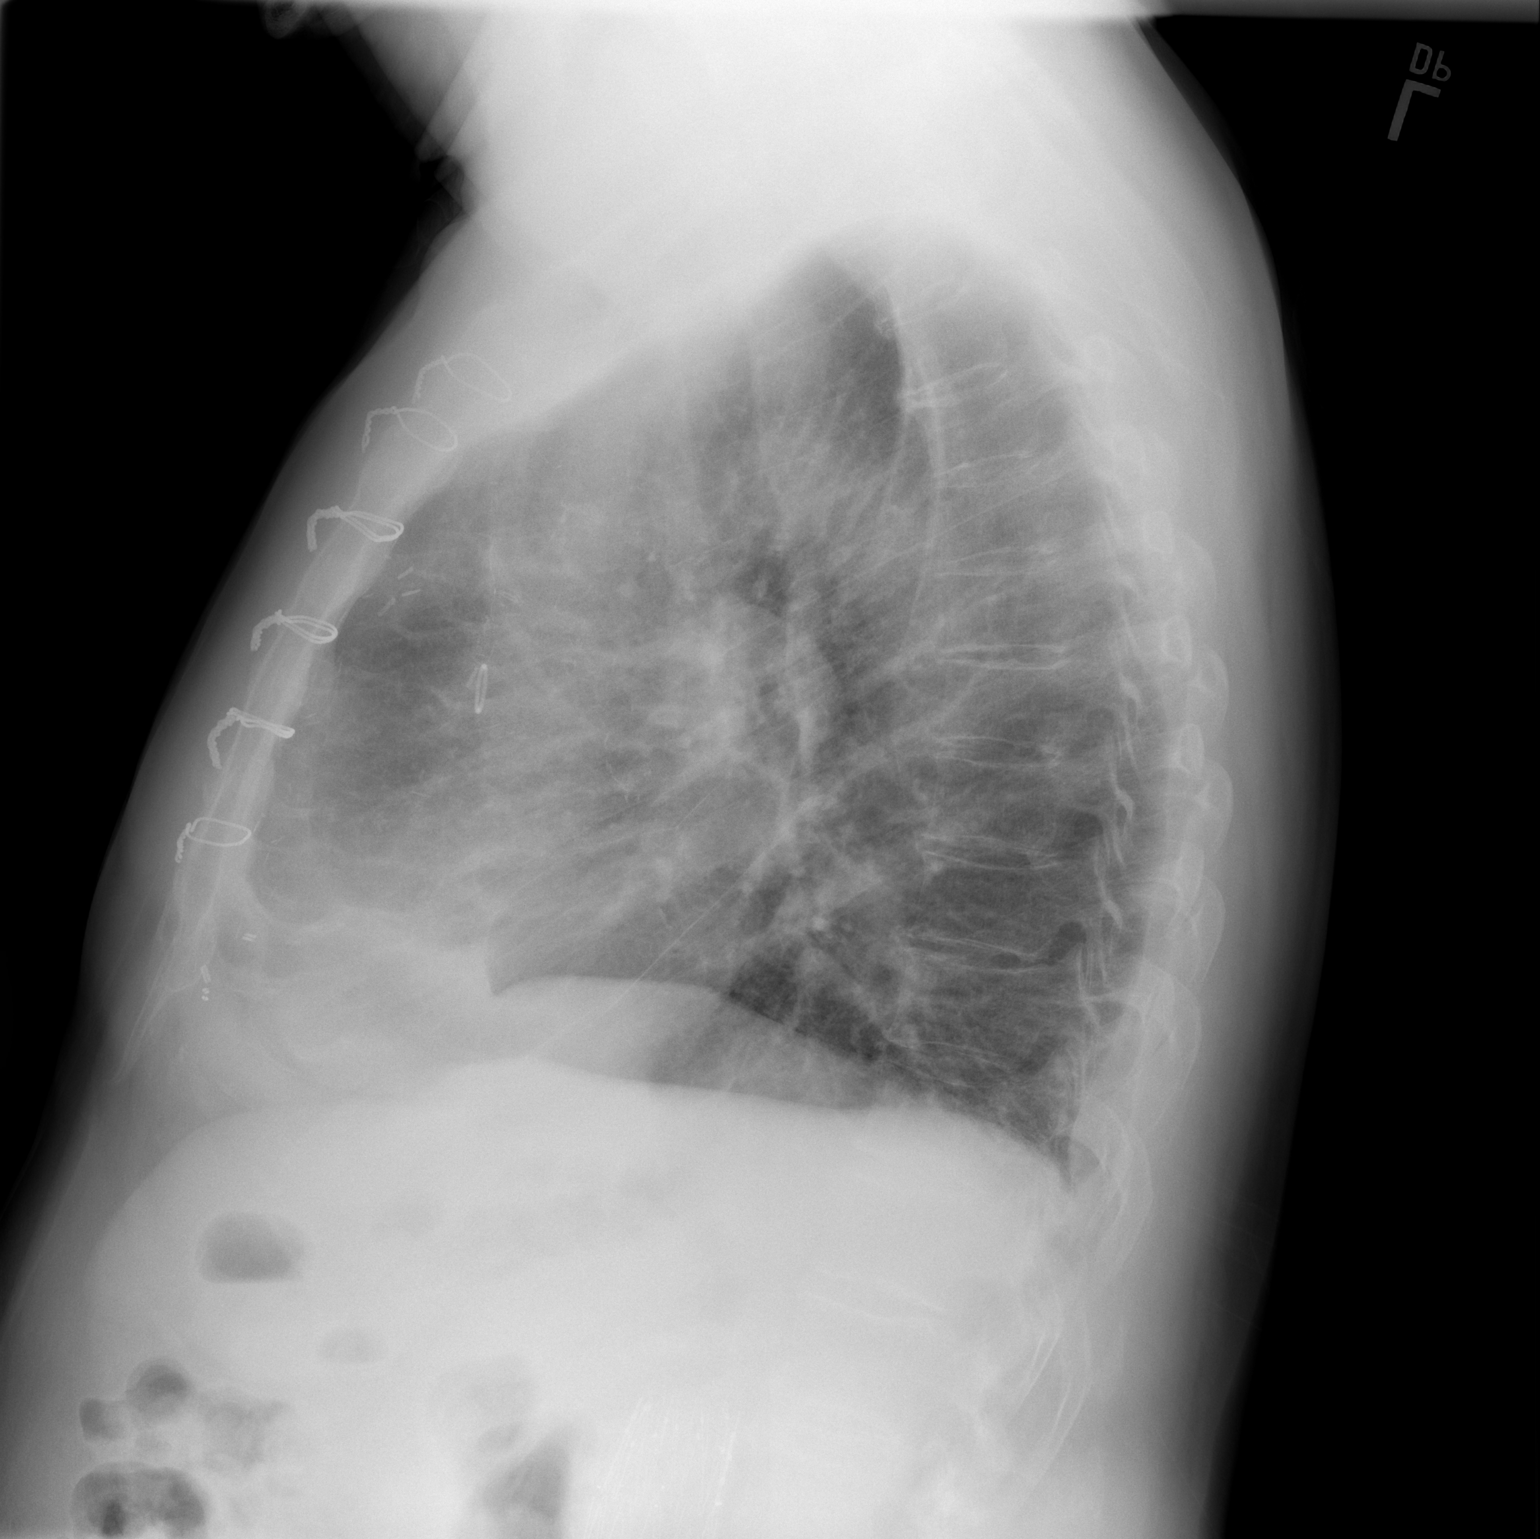

[2 of 2 positions shown; findings below may reference images not displayed]

FINDINGS: The heart size and mediastinal contours are within normal limits.
Status post coronary artery bypass graft. No pneumothorax or pleural
effusion is noted. Right lung is clear. Nodular density seen
projected over left lung base which may represent overlying nipple
shadow. The visualized skeletal structures are unremarkable.
IMPRESSION: Nodular density seen projected over the left lung base which may
represent overlying nipple shadow; repeat radiograph with nipple
markers is recommended to rule out pulmonary nodule or mass. No
other significant abnormality seen in the chest.

## 2018-06-19 ENCOUNTER — Encounter (INDEPENDENT_AMBULATORY_CARE_PROVIDER_SITE_OTHER): Payer: Self-pay | Admitting: Physician Assistant

## 2018-06-19 ENCOUNTER — Ambulatory Visit (INDEPENDENT_AMBULATORY_CARE_PROVIDER_SITE_OTHER): Payer: PPO | Admitting: Physician Assistant

## 2018-06-19 DIAGNOSIS — Z96641 Presence of right artificial hip joint: Secondary | ICD-10-CM

## 2018-06-19 MED ORDER — OXYCODONE-ACETAMINOPHEN 5-325 MG PO TABS: 1.0000 | ORAL_TABLET | ORAL | 0 refills | Status: DC | PRN

## 2018-06-19 NOTE — Progress Notes (Signed)
Office Visit Note   Patient: Carl Lee           Date of Birth: 09/15/1955           MRN: [id number] Visit Date: 06/19/2018              Requested by: [name], MD [id number] [place], [place] [address] PCP: [name], MD   Assessment & Plan: Visit Diagnoses:  1. Status post total replacement of right hip     Plan:-Continue to do IT band stretching.  He has a lumbar epidural steroid inject set up with Dr. Ernestina Patches on December 17.  He will follow-up with [place] on as-needed basis pain persist or becomes worse.  Questions encouraged and answered at length.  Did give him a one-time refill on his Percocet.  I told him not be appropriate for [place] to continue his on a long-term basis he understands.  Follow-Up Instructions: No follow-ups on file.   Orders:  No orders of the defined types were placed in this encounter.  No orders of the defined types were placed in this encounter.     Procedures: No procedures performed   Clinical Data: No additional findings.   Subjective: Chief Complaint  Patient presents with  . Left Hip - Pain, Follow-up    HPI Carl Lee is now 15 months status post left total hip arthroplasty.  He still has some burning numbness tingling down the left leg mostly lateral aspect of the hip occasionally goes down into the lower left leg.  States now that his right low back is bothering him the most.  Did undergo a left hip inner trochanteric injection by Dr. Ernestina Patches on 05/29/2018 and he states his injection really helped for the first week gave him some relief but it at about 2 weeks he was able to do whatever he wanted without any significant pain in the lateral aspect of the hip.  Said no new injury. Review of Systems  See HPI Objective: Vital Signs: There were no vitals taken for this visit.  Physical Exam  Constitutional: He is oriented to person, place, and time. He appears well-developed and well-nourished. No distress.    Pulmonary/Chest: Effort normal and breath sounds normal.  Neurological: He is alert and oriented to person, place, and time.    Ortho Exam Bilateral hips he has good range of motion of both hips without pain.  Left hip with external rotation causes no pain.  He is nontender at the left trochanteric region.  He is positive straight leg raise on the left negative on the right. Specialty Comments:  No specialty comments available.  Imaging: No results found.   PMFS History: Patient Active Problem List   Diagnosis Date Noted  . Cataracts, bilateral 05/09/2018  . Macular degeneration, dry 05/09/2018  . Bilateral hearing loss 12/29/2017  . Chronic foot pain 12/29/2017  . Acute cystitis 11/24/2017  . History of colonic polyps   . Diverticulosis of colon without hemorrhage   . Urinary frequency 06/30/2017  . Status post total replacement of left hip 03/08/2017  . Abnormal nuclear stress test   . Overweight (BMI 25.0-29.9) 01/26/2017  . Radiculopathy due to lumbar intervertebral disc disorder 10/21/2016  . Osteoarthritis of left hip 08/16/2016  . Atrial tachycardia, paroxysmal ([place]) 06/18/2016  . Chronic fatigue 04/27/2016  . Coronary artery disease   . Abdominal discomfort 06/24/2015  . Hepatic steatosis 06/12/2015  . Right trigeminal neuralgia 11/27/2014  . Chronic chest pain  10/30/2014  . Vitamin B12 deficiency   . OSA (obstructive sleep apnea) 09/04/2014  . Headache 06/20/2014  . Prediabetes 06/08/2014  . Zenker diverticulum 04/19/2014  . COPD, moderate ([place]) 03/26/2014  . S/P CABG x 3 02/07/2014  . Occlusion and stenosis of vertebral artery without mention of cerebral infarction 11/12/2013  . Skin rash 04/17/2013  . Healthcare maintenance 01/09/2013  . Dyslipidemia 01/09/2013  . Lumbosacral radiculopathy at S1 09/14/2012  . Hx of migraines   . Arthritis   . History of repair of aneurysm of abdominal aorta using endovascular stent graft 05/01/2012  . Adenomatous polyps  03/24/2012  . Ex-smoker 03/24/2012  . Hypertension 03/24/2012  . History of bladder cancer 03/24/2012   Past Medical History:  Diagnosis Date  . AAA (abdominal aortic aneurysm) ([place]) 03/25/12   s/p endovascular repair  . Adenomatous polyp 06/2009  . Atrial tachycardia, paroxysmal ([place]) 06/18/2016  . Bladder cancer ([place]) 08/16/2008   recurrence 2015 and 2018 treated with office fulguration  . CAD (coronary artery disease), native coronary artery    S/p 3v CABG 01/2014   . Childhood asthma    as a child  . Complication of anesthesia    "takes me a year to get over"  . Contact dermatitis    atypical [name])  . COPD (chronic obstructive pulmonary disease) ([place])   . Coronary artery disease    a.2015: CABG w/ LIMA-LAD, RIMA-OM, SVG-RCA b. Cath on 07/10/15 w/ patent LIMA-LAD and RIMA-OM. SVG-RCA occluded but collaterals present  . Diverticulosis 2013   severe by CT and colonoscopy  . Environmental allergies    improved as ages  . Fracture of lateral malleolus of left ankle 08/29/2013  . Headache    hasn't had migraines in years  . Hepatic steatosis 06/2015   by [place]  . Hx of migraines   . Hypertension   . Lumbosacral radiculopathy at S1 03/2012   left, with spinal stenosis (MRI 04/2013) improved with TF ESI L5/S1 and S1/2 ([place])  . OSA (obstructive sleep apnea) 09/04/2014   Severe with AHI 35/hr, uses BIPAP  . Overweight (BMI 25.0-29.9) 01/26/2017  . Pre-diabetes   . Prediabetes 06/08/2014   CAD   . Resistance to clopidogrel 2015   drug metabolism panel run - asked to scan  . Spinal stenosis    LS1 nerve root impingement from bulging disc  . Vitamin B12 deficiency   . Zenker diverticulum     Family History  Problem Relation Age of Onset  . Diabetes Mother   . Arrhythmia Mother        pacemaker  . Cancer Mother        Bladder  . COPD Mother   . Thyroid disease Mother   . Hyperlipidemia Mother   . Hypertension Mother   . Diabetes Father   . CAD Father 43        CHF, MI  . Dementia Father   . Heart attack Father   . Diabetes Sister   . Heart attack Maternal Grandmother   . AAA (abdominal aortic aneurysm) Maternal Grandmother   . Colon cancer Neg Hx     Past Surgical History:  Procedure Laterality Date  . ABDOMINAL AORTIC ANEURYSM REPAIR  03/25/12   endovascular  . ABI  05/2013   WNL, L TBI low at 0.66  . Bladder cancer  March 2010 and Oct. 2015   Ernst Spell) x 2  . CARDIAC CATHETERIZATION N/A 07/10/2015   Procedure: Left Heart Cath and Cors/Grafts Angiography;  Surgeon: [name], MD;  Location: [facility];  Service: Cardiovascular;  Laterality: N/A;  . COLONOSCOPY  06/2012   hyperplastic polyp, diverticulosis ([place]) rec rpt 5 yrs  . COLONOSCOPY WITH PROPOFOL N/A 08/10/2017   TA, rpt 5 yrs Ardis Hughs, Melene Plan, MD)  . CORONARY ARTERY BYPASS GRAFT N/A 02/07/2014   Procedure: CORONARY ARTERY BYPASS GRAFTING (CABG);  Surgeon: [name], MD;  Location: [facility];  Service: Open Heart Surgery;  Laterality: N/A;  CABG X 3, BILATERAL LIMA, EVH  . CYSTOSCOPY  08/16/08   Bladder Cancer  . EPIDURAL BLOCK INJECTION Left 09/2014, 10/2014, 12/2014   medial L2,3,4, dorsal L5 ramus blocks x2, L L5/S1 and S1/2 transforaminal ESI ([place])  . ESI  04/2013, 06/2013   L L5S1, S12 transforaminal ESI (Dr.  [name])  . ESI Left 04/2014, 05/2014, 06/2014   L5/S1, S1/2; rpt; L4/5  . ESI  03/2016   R L5/S1 interlaminar ESI  . ESOPHAGEAL MANOMETRY N/A 08/10/2017   Procedure: ESOPHAGEAL MANOMETRY (EM);  Surgeon: [name], MD;  Location: [facility];  Service: Endoscopy;  Laterality: N/A;  . ESOPHAGOGASTRODUODENOSCOPY (EGD) WITH PROPOFOL N/A 08/10/2017   Procedure: ESOPHAGOGASTRODUODENOSCOPY (EGD) WITH PROPOFOL;  Surgeon: [name], MD;  Location: [facility];  Service: Endoscopy;  Laterality: N/A;  . FULGURATION OF BLADDER TUMOR  01/2017   recurrent 10mm L lateral wall papillary transitional cell carcinoma ([place])    . INTRAOPERATIVE TRANSESOPHAGEAL ECHOCARDIOGRAM N/A 02/07/2014   Procedure: INTRAOPERATIVE TRANSESOPHAGEAL ECHOCARDIOGRAM;  Surgeon: [name], MD;  Location: [facility];  Service: Open Heart Surgery;  Laterality: N/A;  . LEFT HEART CATH AND CORS/GRAFTS ANGIOGRAPHY N/A 02/11/2017   Procedure: LEFT HEART CATH AND CORS/GRAFTS ANGIOGRAPHY;  Surgeon: [name], MD;  Location: [facility];  Service: Cardiovascular;  Laterality: N/A;  . LEFT HEART CATHETERIZATION WITH CORONARY ANGIOGRAM N/A 02/01/2014   Procedure: LEFT HEART CATHETERIZATION WITH CORONARY ANGIOGRAM;  Surgeon: [name], MD;  [facility];  Service: Cardiovascular;  Laterality: N/A;  . PRP epidural injection Left 11/2015   L5/S1, S1/2 transforaminal epidural PRP injections under fluoroscopy ([place])  . TONSILLECTOMY AND ADENOIDECTOMY  1973  . TOTAL HIP ARTHROPLASTY Left 03/08/2017  . TOTAL HIP ARTHROPLASTY Left 03/08/2017   Procedure: LEFT TOTAL HIP ARTHROPLASTY ANTERIOR APPROACH;  Surgeon: [name], MD;  Location: [facility];  Service: Orthopedics;  Laterality: Left;  [name] VASECTOMY     Social History   Occupational History  . Occupation: Self Employed  Tobacco Use  . Smoking status: Former Smoker    Packs/day: 1.00    Years: 40.00    Pack years: 40.00    Types: Cigarettes    Last attempt to quit: 02/05/2014    Years since quitting: 4.3  . Smokeless tobacco: Never Used  Substance and Sexual Activity  . Alcohol use: Yes    Alcohol/week: 0.0 standard drinks    Comment: Rare- states 1 oz. per week  . Drug use: No  . Sexual activity: Not on file

## 2018-06-23 DIAGNOSIS — H2513 Age-related nuclear cataract, bilateral: Secondary | ICD-10-CM | POA: Diagnosis not present

## 2018-06-23 DIAGNOSIS — H25043 Posterior subcapsular polar age-related cataract, bilateral: Secondary | ICD-10-CM | POA: Diagnosis not present

## 2018-06-23 DIAGNOSIS — H25013 Cortical age-related cataract, bilateral: Secondary | ICD-10-CM | POA: Diagnosis not present

## 2018-06-23 DIAGNOSIS — H2511 Age-related nuclear cataract, right eye: Secondary | ICD-10-CM | POA: Diagnosis not present

## 2018-06-23 DIAGNOSIS — H02834 Dermatochalasis of left upper eyelid: Secondary | ICD-10-CM | POA: Diagnosis not present

## 2018-06-23 DIAGNOSIS — H18413 Arcus senilis, bilateral: Secondary | ICD-10-CM | POA: Diagnosis not present

## 2018-06-27 ENCOUNTER — Ambulatory Visit (INDEPENDENT_AMBULATORY_CARE_PROVIDER_SITE_OTHER): Payer: PPO | Admitting: Physical Medicine and Rehabilitation

## 2018-06-27 ENCOUNTER — Encounter (INDEPENDENT_AMBULATORY_CARE_PROVIDER_SITE_OTHER): Payer: Self-pay | Admitting: Physical Medicine and Rehabilitation

## 2018-06-27 ENCOUNTER — Ambulatory Visit (INDEPENDENT_AMBULATORY_CARE_PROVIDER_SITE_OTHER): Payer: Self-pay

## 2018-06-27 VITALS — BP 134/76 | HR 69 | Temp 98.1°F

## 2018-06-27 DIAGNOSIS — M5416 Radiculopathy, lumbar region: Secondary | ICD-10-CM

## 2018-06-27 MED ORDER — BETAMETHASONE SOD PHOS & ACET 6 (3-3) MG/ML IJ SUSP
12.0000 mg | Freq: Once | INTRAMUSCULAR | Status: AC
  Administered 2018-06-27: 12 mg

## 2018-06-27 NOTE — Progress Notes (Signed)
 .  Numeric Pain Rating Scale and Functional Assessment Average Pain 6   In the last MONTH (on 0-10 scale) has pain interfered with the following?  1. General activity like being  able to carry out your everyday physical activities such as walking, climbing stairs, carrying groceries, or moving a chair?  Rating(4)   +Driver, -BT, -Dye Allergies.  

## 2018-06-27 NOTE — Patient Instructions (Signed)

## 2018-07-10 ENCOUNTER — Other Ambulatory Visit: Payer: Self-pay | Admitting: Family Medicine

## 2018-07-10 DIAGNOSIS — J449 Chronic obstructive pulmonary disease, unspecified: Secondary | ICD-10-CM | POA: Diagnosis not present

## 2018-07-10 DIAGNOSIS — Z125 Encounter for screening for malignant neoplasm of prostate: Secondary | ICD-10-CM

## 2018-07-10 DIAGNOSIS — E538 Deficiency of other specified B group vitamins: Secondary | ICD-10-CM

## 2018-07-10 DIAGNOSIS — G4733 Obstructive sleep apnea (adult) (pediatric): Secondary | ICD-10-CM | POA: Diagnosis not present

## 2018-07-10 DIAGNOSIS — E785 Hyperlipidemia, unspecified: Secondary | ICD-10-CM

## 2018-07-10 DIAGNOSIS — I10 Essential (primary) hypertension: Secondary | ICD-10-CM

## 2018-07-10 DIAGNOSIS — R269 Unspecified abnormalities of gait and mobility: Secondary | ICD-10-CM | POA: Diagnosis not present

## 2018-07-10 DIAGNOSIS — R7303 Prediabetes: Secondary | ICD-10-CM

## 2018-07-13 ENCOUNTER — Ambulatory Visit (INDEPENDENT_AMBULATORY_CARE_PROVIDER_SITE_OTHER): Payer: PPO

## 2018-07-13 ENCOUNTER — Encounter: Payer: Self-pay | Admitting: Family Medicine

## 2018-07-13 ENCOUNTER — Ambulatory Visit: Payer: PPO

## 2018-07-13 VITALS — BP 124/80 | HR 68 | Temp 98.1°F | Ht 69.5 in | Wt 205.0 lb

## 2018-07-13 DIAGNOSIS — E538 Deficiency of other specified B group vitamins: Secondary | ICD-10-CM | POA: Diagnosis not present

## 2018-07-13 DIAGNOSIS — Z125 Encounter for screening for malignant neoplasm of prostate: Secondary | ICD-10-CM

## 2018-07-13 DIAGNOSIS — R7303 Prediabetes: Secondary | ICD-10-CM | POA: Diagnosis not present

## 2018-07-13 DIAGNOSIS — Z0000 Encounter for general adult medical examination without abnormal findings: Secondary | ICD-10-CM | POA: Diagnosis not present

## 2018-07-13 DIAGNOSIS — I10 Essential (primary) hypertension: Secondary | ICD-10-CM | POA: Diagnosis not present

## 2018-07-13 DIAGNOSIS — E785 Hyperlipidemia, unspecified: Secondary | ICD-10-CM

## 2018-07-13 LAB — MICROALBUMIN / CREATININE URINE RATIO
Creatinine,U: 48.3 mg/dL
Microalb Creat Ratio: 1.4 mg/g (ref 0.0–30.0)
Microalb, Ur: <0.7 mg/dL (ref 0.0–1.9)

## 2018-07-13 LAB — LIPID PANEL
Cholesterol: 115 mg/dL (ref 0–200)
HDL: 46.5 mg/dL (ref >39.00)
LDL Cholesterol: 46 mg/dL (ref 0–99)
NonHDL: 68.54
Total CHOL/HDL Ratio: 2
Triglycerides: 115 mg/dL (ref 0.0–149.0)
VLDL: 23 mg/dL (ref 0.0–40.0)

## 2018-07-13 LAB — COMPREHENSIVE METABOLIC PANEL
ALBUMIN: 4.4 g/dL (ref 3.5–5.2)
ALT: 8 U/L (ref 0–53)
AST: 11 U/L (ref 0–37)
Alkaline Phosphatase: 74 U/L (ref 39–117)
BUN: 25 mg/dL — ABNORMAL HIGH (ref 6–23)
CALCIUM: 9.2 mg/dL (ref 8.4–10.5)
CO2: 29 mEq/L (ref 19–32)
Chloride: 104 mEq/L (ref 96–112)
Creatinine, Ser: 0.8 mg/dL (ref 0.40–1.50)
GFR: 103.34 mL/min (ref >60.00)
Glucose, Bld: 94 mg/dL (ref 70–99)
Potassium: 4.7 mEq/L (ref 3.5–5.1)
Sodium: 139 mEq/L (ref 135–145)
Total Bilirubin: 0.9 mg/dL (ref 0.2–1.2)
Total Protein: 6.6 g/dL (ref 6.0–8.3)

## 2018-07-13 LAB — TSH: TSH: 2.2 u[IU]/mL (ref 0.35–4.50)

## 2018-07-13 LAB — HEMOGLOBIN A1C: Hgb A1c MFr Bld: 6.5 % (ref 4.6–6.5)

## 2018-07-13 LAB — PSA, MEDICARE: PSA: 1.18 ng/ml (ref 0.10–4.00)

## 2018-07-13 LAB — VITAMIN B12: Vitamin B-12: 783 pg/mL (ref 211–911)

## 2018-07-13 NOTE — Assessment & Plan Note (Signed)
Preventative protocols reviewed and updated unless pt declined. Discussed healthy diet and lifestyle.  

## 2018-07-13 NOTE — Progress Notes (Signed)
PCP notes:   Health maintenance:  Flu vaccine - PCP follow-up requested Microalbumin - completed  Abnormal screenings:   Fall risk - hx of multiple falls Fall Risk  07/13/2018 11/10/2017 06/29/2017 08/27/2015  Falls in the past year? 1 No Yes Yes  Comment fell twice due to loss of balance - lost balance while walking at night on vacation -  Number falls in past yr: 1 - 1 2 or more  Injury with Fall? 0 - No No  Risk for fall due to : - - - History of fall(s)  Follow up - - - Falls evaluation completed;Education provided    Mini-Cog score: 19/20 MMSE - Mini Mental State Exam 07/13/2018 06/29/2017  Orientation to time 5 5  Orientation to Place 5 5  Registration 3 3  Attention/ Calculation 0 0  Recall 2 3  Recall-comments unable to recall 1 of 3 words -  Language- name 2 objects 0 0  Language- repeat 1 1  Language- follow 3 step command 3 3  Language- read & follow direction 0 0  Write a sentence 0 0  Copy design 0 0  Total score 19 20     Hearing - failed  Hearing Screening   125Hz  250Hz  500Hz  1000Hz  2000Hz  3000Hz  4000Hz  6000Hz  8000Hz   Right ear:   40 0 40  40    Left ear:   40 0 40  40     Patient concerns:   Medication management - Patient would like a cheaper alternative to Ranexa. States copay is $191 for 90 day supply.  Patient also wants to discuss the following concerns with PCP:  Breathing  Gas Hip pain Chest discomfort Hives (began 3-4 months ago) Headache (occurs in afternoon) Ankles (turning brown)  Nurse concerns:  None  Next PCP appt:   07/14/2018 @ 0830

## 2018-07-13 NOTE — Patient Instructions (Signed)
Carl Lee , Thank you for taking time to come for your Medicare Wellness Visit. I appreciate your ongoing commitment to your health goals. Please review the following plan we discussed and let me know if I can assist you in the future.   These are the goals we discussed: Goals    . Increase physical activity     Starting 07/13/18, I will continue to walk 10 minutes daily.         This is a list of the screening recommended for you and due dates:  Health Maintenance  Topic Date Due  . Flu Shot  10/10/2018*  . HIV Screening  06/29/2049*  . Urine Protein Check  07/14/2019  . Colon Cancer Screening  08/10/2022  . DTaP/Tdap/Td vaccine (2 - Td) 12/15/2022  . Tetanus Vaccine  12/15/2022  .  Hepatitis C: One time screening is recommended by Center for Disease Control  (CDC) for  adults born from [birth year] through 1967.   Completed  *Topic was postponed. The date shown is not the original due date.   Preventive Care for Adults  A healthy lifestyle and preventive care can promote health and wellness. Preventive health guidelines for adults include the following key practices.  . A routine yearly physical is a good way to check with your health care provider about your health and preventive screening. It is a chance to share any concerns and updates on your health and to receive a thorough exam.  . Visit your dentist for a routine exam and preventive care every 6 months. Brush your teeth twice a day and floss once a day. Good oral hygiene prevents tooth decay and gum disease.  . The frequency of eye exams is based on your age, health, family medical history, use  of contact lenses, and other factors. Follow your health care provider's recommendations for frequency of eye exams.  . Eat a healthy diet. Foods like vegetables, fruits, whole grains, low-fat dairy products, and lean protein foods contain the nutrients you need without too many calories. Decrease your intake of foods high in solid fats,  added sugars, and salt. Eat the right amount of calories for you. Get information about a proper diet from your health care provider, if necessary.  . Regular physical exercise is one of the most important things you can do for your health. Most adults should get at least 150 minutes of moderate-intensity exercise (any activity that increases your heart rate and causes you to sweat) each week. In addition, most adults need muscle-strengthening exercises on 2 or more days a week.  Silver Sneakers may be a benefit available to you. To determine eligibility, you may visit the website: www.silversneakers.com or contact program at (269)426-3653 Mon-Fri between 8AM-8PM.   . Maintain a healthy weight. The body mass index (BMI) is a screening tool to identify possible weight problems. It provides an estimate of body fat based on height and weight. Your health care provider can find your BMI and can help you achieve or maintain a healthy weight.   For adults 20 years and older: ? A BMI below 18.5 is considered underweight. ? A BMI of 18.5 to 24.9 is normal. ? A BMI of 25 to 29.9 is considered overweight. ? A BMI of 30 and above is considered obese.   . Maintain normal blood lipids and cholesterol levels by exercising and minimizing your intake of saturated fat. Eat a balanced diet with plenty of fruit and vegetables. Blood tests for lipids and cholesterol  should begin at age 26 and be repeated every 5 years. If your lipid or cholesterol levels are high, you are over 50, or you are at high risk for heart disease, you may need your cholesterol levels checked more frequently. Ongoing high lipid and cholesterol levels should be treated with medicines if diet and exercise are not working.  . If you smoke, find out from your health care provider how to quit. If you do not use tobacco, please do not start.  . If you choose to drink alcohol, please do not consume more than 2 drinks per day. One drink is  considered to be 12 ounces (355 mL) of beer, 5 ounces (148 mL) of wine, or 1.5 ounces (44 mL) of liquor.  . If you are 57-76 years old, ask your health care provider if you should take aspirin to prevent strokes.  . Use sunscreen. Apply sunscreen liberally and repeatedly throughout the day. You should seek shade when your shadow is shorter than you. Protect yourself by wearing long sleeves, pants, a wide-brimmed hat, and sunglasses year round, whenever you are outdoors.  . Once a month, do a whole body skin exam, using a mirror to look at the skin on your back. Tell your health care provider of new moles, moles that have irregular borders, moles that are larger than a pencil eraser, or moles that have changed in shape or color.

## 2018-07-13 NOTE — Progress Notes (Signed)
BP 118/78 (BP Location: Left Arm, Patient Position: Sitting, Cuff Size: Normal)   Pulse (!) 59   Temp 98.6 F (37 C) (Oral)   Ht 5' 9.5" (1.765 m)   Wt 210 lb 12 oz (95.6 kg)   SpO2 98%   BMI 30.68 kg/m    CC: CPE Subjective:    Patient ID: Carl Lee, male    DOB: 1956-07-12, 63 y.o.   MRN: [id number]  HPI: Carl Lee is a 63 y.o. male presenting on 07/14/2018 for Annual Exam (Pt 2. )   Saw Katha Cabal yesterday for medicare wellness visit. Note reviewed.  Concerned with cost of ranexa. It was helpful but he feels unaffordable. Intolerant to imdur due to headaches.  Upcoming cataract surgery.  Had zenker diverticulum repaired this past year. Hives present for last 3-4 months throughout entire body. Denies new medicines, supplements, foods, etc. Declines allergy eval.   Regularly gets frontal and L temple headaches in the evenings. No vision changes with this. Treats with tylenol with benefit. Gabapentin worsens mood.   Preventative: COLONOSCOPY WITH PROPOFOL 08/10/2017 - TA, rpt 5 yrs Ardis Hughs, Melene Plan, MD) Prostate cancer screening - no fmhx.Screen every few years (2018) - declines today, states recently checked. Recent cystoscopy by Dr Lovena Neighbours with infection.  Lung cancer screening: 30+ PY hx. Interested in screening Flu shot yearly  Pneumovax 06/2014, prevnar 04/2015  Tdap 12/2012  zostavax -12/2015  shingrix - discussed - will postpone Advanced directive - has living will at home. HCPOA is wife. Asked to bring [place] copy Seat belt use discussed  Sunscreen use discussed, no changing moles. Sees derm.  Ex smoker - quit 2015 Alcohol - a few drinks per month Dentist regularly Eye exam yearly  Caffeine: 1 cup coffee/day  Lives with wife, 1 dog Grown children  Occupation: Therapist, sports - self employed  Edu: 2 yrs college  Activity: fishing, walking occasionally  Diet: good water, fruits/vegetables daily     Relevant past medical, surgical, family and social  history reviewed and updated as indicated. Interim medical history since our last visit reviewed. Allergies and medications reviewed and updated. Outpatient Medications Prior to Visit  Medication Sig Dispense Refill  . albuterol (PROAIR HFA) 108 (90 Base) MCG/ACT inhaler Inhale 2 puffs into the lungs every 6 (six) hours as needed for wheezing or shortness of breath. 1 Inhaler 2  . albuterol (PROVENTIL) (2.5 MG/3ML) 0.083% nebulizer solution Take 3 mLs (2.5 mg total) by nebulization every 6 (six) hours as needed for wheezing or shortness of breath. 75 mL 12  . amLODipine (NORVASC) 5 MG tablet TAKE 1 TABLET BY MOUTH EVERY DAY 90 tablet 3  . aspirin EC 325 MG EC tablet Take 1 tablet (325 mg total) by mouth daily.    [name] atorvastatin (LIPITOR) 20 MG tablet TAKE 1 TABLET (20 MG TOTAL) BY MOUTH DAILY AT 6 PM. 90 tablet 0  . famotidine (PEPCID) 20 MG tablet TAKE 1 TABLET (20 MG TOTAL) BY MOUTH 2 (TWO) TIMES DAILY. 180 tablet 0  . glucose blood (ONE TOUCH ULTRA TEST) test strip CHECK ONCE DAILY AND AS NEEDED    . metoprolol tartrate (LOPRESSOR) 25 MG tablet Take 0.5 tablets (12.5 mg total) by mouth 2 (two) times daily. 90 tablet 3  . oxyCODONE-acetaminophen (PERCOCET/ROXICET) 5-325 MG tablet Take 1-2 tablets by mouth every 4 (four) hours as needed for severe pain. 30 tablet 0  . Propylene Glycol (SYSTANE BALANCE) 0.6 % SOLN Place 1 drop into both eyes daily  as needed (for dry eyes).    [name] umeclidinium-vilanterol (ANORO ELLIPTA) 62.5-25 MCG/INH AEPB Inhale 1 puff into the lungs daily. 180 each 1  . vitamin B-12 (CYANOCOBALAMIN) 500 MCG tablet Take 1 tablet (500 mcg total) by mouth every other day.    [name] acetaminophen (TYLENOL) 325 MG tablet Take 650 mg by mouth every 6 (six) hours as needed for moderate pain or headache.    . gabapentin (NEURONTIN) 300 MG capsule TAKE 1 CAPSULE (300 MG TOTAL) BY MOUTH 2 (TWO) TIMES DAILY. FIRST 3 DAYS TAKE ONCE NIGHTLY 60 capsule 3  . ranolazine (RANEXA) 500 MG 12 hr tablet  Take 1 tablet (500 mg total) by mouth 2 (two) times daily. 180 tablet 3   No facility-administered medications prior to visit.      Per HPI unless specifically indicated in ROS section below Review of Systems  Constitutional: Negative for activity change, appetite change, chills, fatigue, fever and unexpected weight change.  HENT: Negative for hearing loss.   Eyes: Negative for visual disturbance.  Respiratory: Negative for cough, chest tightness, shortness of breath and wheezing.   Cardiovascular: Positive for chest pain. Negative for palpitations and leg swelling.  Gastrointestinal: Negative for abdominal distention, abdominal pain, blood in stool, constipation, diarrhea, nausea and vomiting.       Increased gassiness  Genitourinary: Negative for difficulty urinating and hematuria.  Musculoskeletal: Negative for arthralgias, myalgias and neck pain.  Skin: Negative for rash.  Neurological: Positive for headaches (frontal HA in the evenings). Negative for dizziness, seizures and syncope.  Hematological: Negative for adenopathy. Does not bruise/bleed easily.  Psychiatric/Behavioral: Negative for dysphoric mood. The patient is not nervous/anxious.    Objective:    BP 118/78 (BP Location: Left Arm, Patient Position: Sitting, Cuff Size: Normal)   Pulse (!) 59   Temp 98.6 F (37 C) (Oral)   Ht 5' 9.5" (1.765 m)   Wt 210 lb 12 oz (95.6 kg)   SpO2 98%   BMI 30.68 kg/m   Wt Readings from Last 3 Encounters:  07/14/18 210 lb 12 oz (95.6 kg)  07/13/18 205 lb (93 kg)  06/13/18 210 lb 6.4 oz (95.4 kg)    Physical Exam Vitals signs and nursing note reviewed.  Constitutional:      General: He is not in acute distress.    Appearance: He is well-developed.  HENT:     Head: Normocephalic and atraumatic.     Right Ear: Hearing, tympanic membrane, ear canal and external ear normal.     Left Ear: Hearing, tympanic membrane, ear canal and external ear normal.     Nose: Nose normal.      Mouth/Throat:     Pharynx: Uvula midline. No oropharyngeal exudate or posterior oropharyngeal erythema.  Eyes:     General: No scleral icterus.    Conjunctiva/sclera: Conjunctivae normal.     Pupils: Pupils are equal, round, and reactive to light.  Neck:     Musculoskeletal: Normal range of motion and neck supple.     Vascular: No carotid bruit.  Cardiovascular:     Rate and Rhythm: Normal rate and regular rhythm.     Pulses:          Radial pulses are 2+ on the right side and 2+ on the left side.     Heart sounds: Normal heart sounds. No murmur.  Pulmonary:     Effort: Pulmonary effort is normal. No respiratory distress.     Breath sounds: Normal breath sounds. No wheezing or  rales.  Abdominal:     General: Bowel sounds are normal. There is no distension.     Palpations: Abdomen is soft. There is no mass.     Tenderness: There is no abdominal tenderness. There is no guarding or rebound.  Musculoskeletal: Normal range of motion.     Right lower leg: No edema.     Left lower leg: No edema.  Lymphadenopathy:     Cervical: No cervical adenopathy.  Skin:    General: Skin is warm and dry.     Findings: No rash.  Neurological:     Mental Status: He is alert and oriented to person, place, and time.     Comments: CN grossly intact, station and gait intact  Psychiatric:        Behavior: Behavior normal.        Thought Content: Thought content normal.        Judgment: Judgment normal.       Results for orders placed or performed in visit on 07/13/18  PSA, Medicare  Result Value Ref Range   PSA 1.18 0.10 - 4.00 ng/ml  Vitamin B12  Result Value Ref Range   Vitamin B-12 783 211 - 911 pg/mL  Hemoglobin A1c  Result Value Ref Range   Hgb A1c MFr Bld 6.5 4.6 - 6.5 %  TSH  Result Value Ref Range   TSH 2.20 0.35 - 4.50 uIU/mL  Comprehensive metabolic panel  Result Value Ref Range   Sodium 139 135 - 145 mEq/L   Potassium 4.7 3.5 - 5.1 mEq/L   Chloride 104 96 - 112 mEq/L   CO2 29  19 - 32 mEq/L   Glucose, Bld 94 70 - 99 mg/dL   BUN 25 (H) 6 - 23 mg/dL   Creatinine, Ser 0.80 0.40 - 1.50 mg/dL   Total Bilirubin 0.9 0.2 - 1.2 mg/dL   Alkaline Phosphatase 74 39 - 117 U/L   AST 11 0 - 37 U/L   ALT 8 0 - 53 U/L   Total Protein 6.6 6.0 - 8.3 g/dL   Albumin 4.4 3.5 - 5.2 g/dL   Calcium 9.2 8.4 - 10.5 mg/dL   GFR 103.34 >60.00 mL/min  Lipid panel  Result Value Ref Range   Cholesterol 115 0 - 200 mg/dL   Triglycerides 115.0 0.0 - 149.0 mg/dL   HDL 46.50 >39.00 mg/dL   VLDL 23.0 0.0 - 40.0 mg/dL   LDL Cholesterol 46 0 - 99 mg/dL   Total CHOL/HDL Ratio 2    NonHDL 68.54   Microalbumin / creatinine urine ratio  Result Value Ref Range   Microalb, Ur <0.7 0.0 - 1.9 mg/dL   Creatinine,U 48.3 mg/dL   Microalb Creat Ratio 1.4 0.0 - 30.0 mg/g   Assessment & Plan:   Problem List Items Addressed This Visit    Obesity, Class I, BMI 30-34.9    Encouraged healthy diet and lifestyle choices to affect sustainable weight loss.       Migraine    H/o migraines. Notes gabapentin worsens mood - suggested he decrease from BID to nightly.      Relevant Medications   acetaminophen (TYLENOL) 325 MG tablet   gabapentin (NEURONTIN) 300 MG capsule   Hypertension    Chronic, stable. Continue current regimen.       Hives    Unclear cause. Offered allergist eval, he declines today       History of bladder cancer    Sees urology yearly.  Hepatic steatosis    LFTs normal.       Healthcare maintenance - Primary    Preventative protocols reviewed and updated unless pt declined. Discussed healthy diet and lifestyle.       Headache    Ongoing - possible MOH - suggested he back off tylenol use. Will reassess at f/u visit.       Relevant Medications   acetaminophen (TYLENOL) 325 MG tablet   gabapentin (NEURONTIN) 300 MG capsule   Gassiness    rec avoid gas producing foods, trial gasX.       Ex-smoker    Ex smoker, quit 2015, remains abstinent.  Discussed lung  cancer screening - pt interested. Referral placed.       Relevant Orders   Ambulatory Referral for Lung Cancer Scre   Dyslipidemia    Chronic, stable. Continue lipitor. Known CAD hx. The ASCVD Risk score Mikey Bussing DC Jr., et al., 2013) failed to calculate for the following reasons:   The valid total cholesterol range is 130 to 320 mg/dL       Coronary artery disease    Not currently taking ranexa - was too expensive.  Suggested he touch base with cards.       COPD, moderate ([place])   Controlled diabetes mellitus type 2 with complications ([place])    New diagnosis. Discussed with patient. rec avoid added sugars, sweetened beverages, watch simple carbs. Reassess at f/u in 4 months.       Advanced care planning/counseling discussion    Advanced directive - has living will at home. HCPOA is wife. Asked to bring [place] copy.        Other Visit Diagnoses    Need for influenza vaccination       Relevant Orders   Flu Vaccine QUAD 36+ mos IM (Completed)       Meds ordered this encounter  Medications  . acetaminophen (TYLENOL) 325 MG tablet    Sig: Take 2 tablets (650 mg total) by mouth every 6 (six) hours as needed for moderate pain or headache.  . gabapentin (NEURONTIN) 300 MG capsule    Sig: Take 1 capsule (300 mg total) by mouth at bedtime. First 3 days take once nightly    Dispense:  30 capsule    Refill:  11   Orders Placed This Encounter  Procedures  . Flu Vaccine QUAD 36+ mos IM  . Ambulatory Referral for Lung Cancer Scre    Referral Priority:   Routine    Referral Type:   Consultation    Referral Reason:   Specialty Services Required    Number of Visits Requested:   1   Patient instructions: Flu shot today If interested, check with pharmacy about new 2 shot shingles series (shingrix).  Bring [place] copy of your advanced directive to update chart.  Back off tylenol as it may be causing medication overuse headache.  Try gas X for gassiness. Exclude gas producing foods (beans,  onions, celery, carrots, raisins, bananas, apricots, prunes, brussel sprouts, wheat germ, pretzels).  We will set you up for lung cancer screening CT.  Follow up plan: Return in about 4 months (around 11/12/2018) for follow up visit.  [name], MD

## 2018-07-13 NOTE — Progress Notes (Signed)
Subjective:   Carl Lee is a 63 y.o. male who presents for Medicare Annual/Subsequent preventive examination.  Review of Systems:  N/A Cardiac Risk Factors include: advanced age (>79men, >84 women);male gender;hypertension     Objective:    Vitals: BP 124/80 (BP Location: Right Arm, Patient Position: Sitting, Cuff Size: Normal)   Pulse 68   Temp 98.1 F (36.7 C) (Oral)   Ht 5' 9.5" (1.765 m)   Wt 205 lb (93 kg)   SpO2 93%   BMI 29.84 kg/m   Body mass index is 29.84 kg/m.  Advanced Directives 07/13/2018 12/26/2017 08/10/2017 06/29/2017 03/09/2017 02/24/2017 02/11/2017  Does Patient Have a Medical Advance Directive? Yes Yes Yes Yes Yes Yes Yes  Type of Paramedic of [place];Living will [place];Living will [place];Living will [place];Living will [place];Living will [place];Living will Living will;Healthcare Power of Attorney  Does patient want to make changes to medical advance directive? - - - - No - Patient declined - -  Copy of [place] in Chart? No - copy requested - No - copy requested No - copy requested No - copy requested - Yes  Pre-existing out of facility DNR order (yellow form or pink MOST form) - - - - - - -    Tobacco Social History   Tobacco Use  Smoking Status Former Smoker  . Packs/day: 1.00  . Years: 40.00  . Pack years: 40.00  . Types: Cigarettes  . Last attempt to quit: 02/05/2014  . Years since quitting: 4.4  Smokeless Tobacco Never Used     Counseling given: No   Clinical Intake:  Pre-visit preparation completed: Yes  Pain : No/denies pain Pain Score: 6      Nutritional Status: BMI 25 -29 Overweight Nutritional Risks: None  How often do you need to have someone help you when you read instructions, pamphlets, or other written materials from your doctor or pharmacy?: 1 - Never What is the last  grade level you completed in school?: 12th grade + 3 yrs college  Interpreter Needed?: No  Comments: pt lives with spouse Information entered by :: LPinson, LPN  Past Medical History:  Diagnosis Date  . AAA (abdominal aortic aneurysm) ([place]) 03/25/12   s/p endovascular repair  . Adenomatous polyp 06/2009  . Atrial tachycardia, paroxysmal ([place]) 06/18/2016  . Bladder cancer ([place]) 08/16/2008   recurrence 2015 and 2018 treated with office fulguration  . CAD (coronary artery disease), native coronary artery    S/p 3v CABG 01/2014   . Childhood asthma    as a child  . Complication of anesthesia    "takes me a year to get over"  . Contact dermatitis    atypical [name])  . COPD (chronic obstructive pulmonary disease) ([place])   . Coronary artery disease    a.2015: CABG w/ LIMA-LAD, RIMA-OM, SVG-RCA b. Cath on 07/10/15 w/ patent LIMA-LAD and RIMA-OM. SVG-RCA occluded but collaterals present  . Diverticulosis 2013   severe by CT and colonoscopy  . Environmental allergies    improved as ages  . Fracture of lateral malleolus of left ankle 08/29/2013  . Headache    hasn't had migraines in years  . Hepatic steatosis 06/2015   by [place]  . Hx of migraines   . Hypertension   . Lumbosacral radiculopathy at S1 03/2012   left, with spinal stenosis (MRI 04/2013) improved with TF ESI L5/S1 and  S1/2 (Dalton Markham)  . OSA (obstructive sleep apnea) 09/04/2014   Severe with AHI 35/hr, uses BIPAP  . Overweight (BMI 25.0-29.9) 01/26/2017  . Pre-diabetes   . Prediabetes 06/08/2014   CAD   . Resistance to clopidogrel 2015   drug metabolism panel run - asked to scan  . Spinal stenosis    LS1 nerve root impingement from bulging disc  . Vitamin B12 deficiency   . Zenker diverticulum    Past Surgical History:  Procedure Laterality Date  . ABDOMINAL AORTIC ANEURYSM REPAIR  03/25/12   endovascular  . ABI  05/2013   WNL, L TBI low at 0.66  . Bladder cancer  March 2010 and Oct. 2015   Ernst Spell) x 2  .  CARDIAC CATHETERIZATION N/A 07/10/2015   Procedure: Left Heart Cath and Cors/Grafts Angiography;  Surgeon: [name], MD;  Location: [facility];  Service: Cardiovascular;  Laterality: N/A;  . COLONOSCOPY  06/2012   hyperplastic polyp, diverticulosis ([place]) rec rpt 5 yrs  . COLONOSCOPY WITH PROPOFOL N/A 08/10/2017   TA, rpt 5 yrs Ardis Hughs, Melene Plan, MD)  . CORONARY ARTERY BYPASS GRAFT N/A 02/07/2014   Procedure: CORONARY ARTERY BYPASS GRAFTING (CABG);  Surgeon: [name], MD;  Location: [facility];  Service: Open Heart Surgery;  Laterality: N/A;  CABG X 3, BILATERAL LIMA, EVH  . CYSTOSCOPY  08/16/08   Bladder Cancer  . EPIDURAL BLOCK INJECTION Left 09/2014, 10/2014, 12/2014   medial L2,3,4, dorsal L5 ramus blocks x2, L L5/S1 and S1/2 transforaminal ESI (Dalton Jourdanton)  . ESI  04/2013, 06/2013   L L5S1, S12 transforaminal ESI (Dr.  [name])  . ESI Left 04/2014, 05/2014, 06/2014   L5/S1, S1/2; rpt; L4/5  . ESI  03/2016   R L5/S1 interlaminar ESI  . ESOPHAGEAL MANOMETRY N/A 08/10/2017   Procedure: ESOPHAGEAL MANOMETRY (EM);  Surgeon: [name], MD;  Location: [facility];  Service: Endoscopy;  Laterality: N/A;  . ESOPHAGOGASTRODUODENOSCOPY (EGD) WITH PROPOFOL N/A 08/10/2017   Procedure: ESOPHAGOGASTRODUODENOSCOPY (EGD) WITH PROPOFOL;  Surgeon: [name], MD;  Location: [facility];  Service: Endoscopy;  Laterality: N/A;  . FULGURATION OF BLADDER TUMOR  01/2017   recurrent 92mm L lateral wall papillary transitional cell carcinoma ([place])  . INTRAOPERATIVE TRANSESOPHAGEAL ECHOCARDIOGRAM N/A 02/07/2014   Procedure: INTRAOPERATIVE TRANSESOPHAGEAL ECHOCARDIOGRAM;  Surgeon: [name], MD;  Location: [facility];  Service: Open Heart Surgery;  Laterality: N/A;  . LEFT HEART CATH AND CORS/GRAFTS ANGIOGRAPHY N/A 02/11/2017   Procedure: LEFT HEART CATH AND CORS/GRAFTS ANGIOGRAPHY;  Surgeon: [name], MD;  Location: [facility];  Service:  Cardiovascular;  Laterality: N/A;  . LEFT HEART CATHETERIZATION WITH CORONARY ANGIOGRAM N/A 02/01/2014   Procedure: LEFT HEART CATHETERIZATION WITH CORONARY ANGIOGRAM;  Surgeon: [name], MD;  Location: [facility];  Service: Cardiovascular;  Laterality: N/A;  . PRP epidural injection Left 11/2015   L5/S1, S1/2 transforaminal epidural PRP injections under fluoroscopy (Dalton-Bethea)  . TONSILLECTOMY AND ADENOIDECTOMY  1973  . TOTAL HIP ARTHROPLASTY Left 03/08/2017  . TOTAL HIP ARTHROPLASTY Left 03/08/2017   Procedure: LEFT TOTAL HIP ARTHROPLASTY ANTERIOR APPROACH;  Surgeon: [name], MD;  Location: [facility];  Service: Orthopedics;  Laterality: Left;  [name] VASECTOMY    . ZENKER'S DIVERTICULECTOMY  09/2017   Family History  Problem Relation Age of Onset  . Diabetes Mother   . Arrhythmia Mother        pacemaker  . Cancer Mother  Bladder  . COPD Mother   . Thyroid disease Mother   . Hyperlipidemia Mother   . Hypertension Mother   . Diabetes Father   . CAD Father 85       CHF, MI  . Dementia Father   . Heart attack Father   . Diabetes Sister   . Heart attack Maternal Grandmother   . AAA (abdominal aortic aneurysm) Maternal Grandmother   . Colon cancer Neg Hx    Social History   Socioeconomic History  . Marital status: Married    Spouse name: Not on file  . Number of children: 2  . Years of education: Not on file  . Highest education level: Not on file  Occupational History  . Occupation: Self Employed  Social Needs  . Financial resource strain: Not on file  . Food insecurity:    Worry: Not on file    Inability: Not on file  . Transportation needs:    Medical: Not on file    Non-medical: Not on file  Tobacco Use  . Smoking status: Former Smoker    Packs/day: 1.00    Years: 40.00    Pack years: 40.00    Types: Cigarettes    Last attempt to quit: 02/05/2014    Years since quitting: 4.4  . Smokeless tobacco: Never Used  Substance and Sexual  Activity  . Alcohol use: Yes    Alcohol/week: 0.0 standard drinks    Comment: Rare- states 1 oz. per week  . Drug use: No  . Sexual activity: Not Currently  Lifestyle  . Physical activity:    Days per week: Not on file    Minutes per session: Not on file  . Stress: Not on file  Relationships  . Social connections:    Talks on phone: Not on file    Gets together: Not on file    Attends religious service: Not on file    Active member of club or organization: Not on file    Attends meetings of clubs or organizations: Not on file    Relationship status: Not on file  Other Topics Concern  . Not on file  Social History Narrative   Caffeine: 1 cup coffee/day   Lives with wife, 1 dog   grown children   Occupation: Therapist, sports - self employed.  Disability after CABG   Edu: 2 yrs college   Activity: fishing, walking occasionally   Diet: good water, fruits/vegetables daily    Outpatient Encounter Medications as of 07/13/2018  Medication Sig  . acetaminophen (TYLENOL) 325 MG tablet Take 650 mg by mouth every 6 (six) hours as needed for moderate pain or headache.  . albuterol (PROAIR HFA) 108 (90 Base) MCG/ACT inhaler Inhale 2 puffs into the lungs every 6 (six) hours as needed for wheezing or shortness of breath.  [name] albuterol (PROVENTIL) (2.5 MG/3ML) 0.083% nebulizer solution Take 3 mLs (2.5 mg total) by nebulization every 6 (six) hours as needed for wheezing or shortness of breath.  [name] amLODipine (NORVASC) 5 MG tablet TAKE 1 TABLET BY MOUTH EVERY DAY  . aspirin EC 325 MG EC tablet Take 1 tablet (325 mg total) by mouth daily.  [name] atorvastatin (LIPITOR) 20 MG tablet TAKE 1 TABLET (20 MG TOTAL) BY MOUTH DAILY AT 6 PM.  . famotidine (PEPCID) 20 MG tablet TAKE 1 TABLET (20 MG TOTAL) BY MOUTH 2 (TWO) TIMES DAILY.  [name] gabapentin (NEURONTIN) 300 MG capsule TAKE 1 CAPSULE (300 MG TOTAL) BY MOUTH 2 (TWO)  TIMES DAILY. FIRST 3 DAYS TAKE ONCE NIGHTLY  . glucose blood (ONE TOUCH ULTRA TEST) test  strip CHECK ONCE DAILY AND AS NEEDED  . metoprolol tartrate (LOPRESSOR) 25 MG tablet Take 0.5 tablets (12.5 mg total) by mouth 2 (two) times daily.  [name] oxyCODONE-acetaminophen (PERCOCET/ROXICET) 5-325 MG tablet Take 1-2 tablets by mouth every 4 (four) hours as needed for severe pain.  [name] Propylene Glycol (SYSTANE BALANCE) 0.6 % SOLN Place 1 drop into both eyes daily as needed (for dry eyes).  . ranolazine (RANEXA) 500 MG 12 hr tablet Take 1 tablet (500 mg total) by mouth 2 (two) times daily.  [name] umeclidinium-vilanterol (ANORO ELLIPTA) 62.5-25 MCG/INH AEPB Inhale 1 puff into the lungs daily.  . vitamin B-12 (CYANOCOBALAMIN) 500 MCG tablet Take 1 tablet (500 mcg total) by mouth every other day.   No facility-administered encounter medications on file as of 07/13/2018.     Activities of Daily Living In your present state of health, do you have any difficulty performing the following activities: 07/13/2018  Hearing? Y  Vision? Y  Difficulty concentrating or making decisions? Y  Walking or climbing stairs? Y  Dressing or bathing? N  Doing errands, shopping? N  Preparing Food and eating ? N  Using the Toilet? N  In the past six months, have you accidently leaked urine? N  Do you have problems with loss of bowel control? N  Managing your Medications? N  Managing your Finances? N  Housekeeping or managing your Housekeeping? N  Some recent data might be hidden    Patient Care Team: [name], MD as PCP - General (Family Medicine) Josue Hector, MD as PCP - Cardiology (Cardiology) [name], MD (Gastroenterology) [name], MD as Consulting Physician (Cardiology) Josue Hector, MD as Consulting Physician (Cardiology) Juanito Doom, MD as Consulting Physician (Pulmonary Disease) Dalton-Bethea, Fabio Asa, MD (Physical Medicine and Rehabilitation) [name], MD as Consulting Physician (Orthopedic Surgery)   Assessment:   This is a routine wellness  examination for Aedyn.   Hearing Screening   125Hz  250Hz  500Hz  1000Hz  2000Hz  3000Hz  4000Hz  6000Hz  8000Hz   Right ear:   40 0 40  40    Left ear:   40 0 40  40    Vision Screening Comments: Vision exam scheduled 07/13/18 with Dr.Beavis   Exercise Activities and Dietary recommendations Current Exercise Habits: Home exercise routine, Type of exercise: walking, Time (Minutes): 10, Frequency (Times/Week): 7, Weekly Exercise (Minutes/Week): 70, Exercise limited by: None identified  Goals    . Increase physical activity     Starting 07/13/18, I will continue to walk 10 minutes daily.         Fall Risk Fall Risk  07/13/2018 11/10/2017 06/29/2017 08/27/2015  Falls in the past year? 1 No Yes Yes  Comment fell twice due to loss of balance - lost balance while walking at night on vacation -  Number falls in past yr: 1 - 1 2 or more  Injury with Fall? 0 - No No  Risk for fall due to : - - - History of fall(s)  Follow up - - - Falls evaluation completed;Education provided  Depression Screen PHQ 2/9 Scores 07/13/2018 11/10/2017 06/29/2017 08/27/2015  PHQ - 2 Score 0 0 0 0  PHQ- 9 Score 0 - 0 -    Cognitive Function MMSE - Mini Mental State Exam 07/13/2018 06/29/2017  Orientation to time 5 5  Orientation to Place 5 5  Registration 3 3  Attention/ Calculation 0 0  Recall 2 3  Recall-comments unable to recall 1 of 3 words -  Language- name 2 objects 0 0  Language- repeat 1 1  Language- follow 3 step command 3 3  Language- read & follow direction 0 0  Write a sentence 0 0  Copy design 0 0  Total score 19 20     PLEASE NOTE: A Mini-Cog screen was completed. Maximum score is 20. A value of 0 denotes this part of Folstein MMSE was not completed or the patient failed this part of the Mini-Cog screening.   Mini-Cog Screening Orientation to Time - Max 5 pts Orientation to Place - Max 5 pts Registration - Max 3 pts Recall - Max 3 pts Language Repeat - Max 1 pts Language Follow 3 Step Command - Max 3  pts     Immunization History  Administered Date(s) Administered  . Influenza,inj,Quad PF,6+ Mos 04/17/2013, 06/17/2014, 05/01/2015, 03/25/2016, 04/01/2017  . Pneumococcal Conjugate-13 05/01/2015  . Pneumococcal Polysaccharide-23 06/17/2014  . Tdap 12/14/2012  . Zoster 12/23/2015   Screening Tests Health Maintenance  Topic Date Due  . INFLUENZA VACCINE  10/10/2018 (Originally 02/09/2018)  . HIV Screening  06/29/2049 (Originally 03/27/1971)  . URINE MICROALBUMIN  07/14/2019  . COLONOSCOPY  08/10/2022  . DTaP/Tdap/Td (2 - Td) 12/15/2022  . TETANUS/TDAP  12/15/2022  . Hepatitis C Screening  Completed     Plan:   I have personally reviewed, addressed, and noted the following in the patient's chart:  A. Medical and social history B. Use of alcohol, tobacco or illicit drugs  C. Current medications and supplements D. Functional ability and status E.  Nutritional status F.  Physical activity G. Advance directives H. List of other physicians I.  Hospitalizations, surgeries, and ER visits in previous 12 months J.  [place] to include hearing, vision, cognitive, depression L. Referrals and appointments - none  In addition, I have reviewed and discussed with patient certain preventive protocols, quality metrics, and best practice recommendations. A written personalized care plan for preventive services as well as general preventive health recommendations were provided to patient.  See attached scanned questionnaire for additional information.   Signed,   Lindell Noe, MHA, BS, LPN Health Coach

## 2018-07-14 ENCOUNTER — Telehealth: Payer: Self-pay | Admitting: *Deleted

## 2018-07-14 ENCOUNTER — Ambulatory Visit (INDEPENDENT_AMBULATORY_CARE_PROVIDER_SITE_OTHER): Payer: PPO | Admitting: Family Medicine

## 2018-07-14 ENCOUNTER — Encounter: Payer: Self-pay | Admitting: Family Medicine

## 2018-07-14 VITALS — BP 118/78 | HR 59 | Temp 98.6°F | Ht 69.5 in | Wt 210.8 lb

## 2018-07-14 DIAGNOSIS — J449 Chronic obstructive pulmonary disease, unspecified: Secondary | ICD-10-CM | POA: Diagnosis not present

## 2018-07-14 DIAGNOSIS — I25810 Atherosclerosis of coronary artery bypass graft(s) without angina pectoris: Secondary | ICD-10-CM

## 2018-07-14 DIAGNOSIS — I10 Essential (primary) hypertension: Secondary | ICD-10-CM | POA: Diagnosis not present

## 2018-07-14 DIAGNOSIS — R519 Headache, unspecified: Secondary | ICD-10-CM

## 2018-07-14 DIAGNOSIS — Z0000 Encounter for general adult medical examination without abnormal findings: Secondary | ICD-10-CM | POA: Diagnosis not present

## 2018-07-14 DIAGNOSIS — R51 Headache: Secondary | ICD-10-CM

## 2018-07-14 DIAGNOSIS — Z87891 Personal history of nicotine dependence: Secondary | ICD-10-CM

## 2018-07-14 DIAGNOSIS — Z8551 Personal history of malignant neoplasm of bladder: Secondary | ICD-10-CM | POA: Diagnosis not present

## 2018-07-14 DIAGNOSIS — Z122 Encounter for screening for malignant neoplasm of respiratory organs: Secondary | ICD-10-CM

## 2018-07-14 DIAGNOSIS — E785 Hyperlipidemia, unspecified: Secondary | ICD-10-CM

## 2018-07-14 DIAGNOSIS — Z23 Encounter for immunization: Secondary | ICD-10-CM

## 2018-07-14 DIAGNOSIS — R140 Abdominal distension (gaseous): Secondary | ICD-10-CM

## 2018-07-14 DIAGNOSIS — G43909 Migraine, unspecified, not intractable, without status migrainosus: Secondary | ICD-10-CM

## 2018-07-14 DIAGNOSIS — E118 Type 2 diabetes mellitus with unspecified complications: Secondary | ICD-10-CM

## 2018-07-14 DIAGNOSIS — L509 Urticaria, unspecified: Secondary | ICD-10-CM | POA: Insufficient documentation

## 2018-07-14 DIAGNOSIS — K760 Fatty (change of) liver, not elsewhere classified: Secondary | ICD-10-CM

## 2018-07-14 DIAGNOSIS — E669 Obesity, unspecified: Secondary | ICD-10-CM

## 2018-07-14 DIAGNOSIS — Z7189 Other specified counseling: Secondary | ICD-10-CM | POA: Insufficient documentation

## 2018-07-14 MED ORDER — ACETAMINOPHEN 325 MG PO TABS: 650.0000 mg | ORAL_TABLET | Freq: Four times a day (QID) | ORAL | Status: DC | PRN

## 2018-07-14 MED ORDER — GABAPENTIN 300 MG PO CAPS: 300.0000 mg | ORAL_CAPSULE | Freq: Every day | ORAL | 11 refills | Status: DC

## 2018-07-14 NOTE — Assessment & Plan Note (Deleted)
New diagnosis. Discussed with patient. rec avoid added sugars, sweetened beverages, watch simple carbs. Reassess at f/u in 4 months.

## 2018-07-14 NOTE — Assessment & Plan Note (Signed)
Advanced directive - has living will at home. HCPOA is wife. Asked to bring [place] copy.

## 2018-07-14 NOTE — Telephone Encounter (Signed)
Received referral for initial lung cancer screening scan. Contacted patient and obtained smoking history,(former, quit 02/05/14, 40 pack year) as well as answering questions related to screening process. Patient denies signs of lung cancer such as weight loss or hemoptysis. Patient denies comorbidity that would prevent curative treatment if lung cancer were found. Patient is scheduled for shared decision making visit and CT scan on 07/18/18 at 145.

## 2018-07-14 NOTE — Patient Instructions (Addendum)
Flu shot today If interested, check with pharmacy about new 2 shot shingles series (shingrix).  Bring [place] copy of your advanced directive to update chart.  Back off tylenol as it may be causing medication overuse headache.  Try gas X for gassiness. Exclude gas producing foods (beans, onions, celery, carrots, raisins, bananas, apricots, prunes, brussel sprouts, wheat germ, pretzels).  We will set you up for lung cancer screening CT.  Health Maintenance After Age 65 After age 79, you are at a higher risk for certain long-term diseases and infections as well as injuries from falls. Falls are a major cause of broken bones and head injuries in people who are older than age 13. Getting regular preventive care can help to keep you healthy and well. Preventive care includes getting regular testing and making lifestyle changes as recommended by your health care provider. Talk with your health care provider about:  Which screenings and tests you should have. A screening is a test that checks for a disease when you have no symptoms.  A diet and exercise plan that is right for you. What should I know about screenings and tests to prevent falls? Screening and testing are the best ways to find a health problem early. Early diagnosis and treatment give you the best chance of managing medical conditions that are common after age 65. Certain conditions and lifestyle choices may make you more likely to have a fall. Your health care provider may recommend:  Regular vision checks. Poor vision and conditions such as cataracts can make you more likely to have a fall. If you wear glasses, make sure to get your prescription updated if your vision changes.  Medicine review. Work with your health care provider to regularly review all of the medicines you are taking, including over-the-counter medicines. Ask your health care provider about any side effects that may make you more likely to have a fall. Tell your health care  provider if any medicines that you take make you feel dizzy or sleepy.  Osteoporosis screening. Osteoporosis is a condition that causes the bones to get weaker. This can make the bones weak and cause them to break more easily.  Blood pressure screening. Blood pressure changes and medicines to control blood pressure can make you feel dizzy.  Strength and balance checks. Your health care provider may recommend certain tests to check your strength and balance while standing, walking, or changing positions.  Foot health exam. Foot pain and numbness, as well as not wearing proper footwear, can make you more likely to have a fall.  Depression screening. You may be more likely to have a fall if you have a fear of falling, feel emotionally low, or feel unable to do activities that you used to do.  Alcohol use screening. Using too much alcohol can affect your balance and may make you more likely to have a fall. What actions can I take to lower my risk of falls? General instructions  Talk with your health care provider about your risks for falling. Tell your health care provider if: ? You fall. Be sure to tell your health care provider about all falls, even ones that seem minor. ? You feel dizzy, sleepy, or off-balance.  Take over-the-counter and prescription medicines only as told by your health care provider. These include any supplements.  Eat a healthy diet and maintain a healthy weight. A healthy diet includes low-fat dairy products, low-fat (lean) meats, and fiber from whole grains, beans, and lots of fruits and  vegetables. Home safety  Remove any tripping hazards, such as rugs, cords, and clutter.  Install safety equipment such as grab bars in bathrooms and safety rails on stairs.  Keep rooms and walkways well-lit. Activity   Follow a regular exercise program to stay fit. This will help you maintain your balance. Ask your health care provider what types of exercise are appropriate for  you.  If you need a cane or walker, use it as recommended by your health care provider.  Wear supportive shoes that have nonskid soles. Lifestyle  Do not drink alcohol if your health care provider tells you not to drink.  If you drink alcohol, limit how much you have: ? 0-1 drink a day for women. ? 0-2 drinks a day for men.  Be aware of how much alcohol is in your drink. In the [place] one drink equals one typical bottle of beer (12 oz), one-half glass of wine (5 oz), or one shot of hard liquor (1 oz).  Do not use any products that contain nicotine or tobacco, such as cigarettes and e-cigarettes. If you need help quitting, ask your health care provider. Summary  Having a healthy lifestyle and getting preventive care can help to protect your health and wellness after age 65.  Screening and testing are the best way to find a health problem early and help you avoid having a fall. Early diagnosis and treatment give you the best chance for managing medical conditions that are more common for people who are older than age 65.  Falls are a major cause of broken bones and head injuries in people who are older than age 65. Take precautions to prevent a fall at home.  Work with your health care provider to learn what changes you can make to improve your health and wellness and to prevent falls. This information is not intended to replace advice given to you by your health care provider. Make sure you discuss any questions you have with your health care provider. Document Released: 05/11/2017 Document Revised: 05/11/2017 Document Reviewed: 05/11/2017 Elsevier Interactive Patient Education  2019 [employer].

## 2018-07-14 NOTE — Assessment & Plan Note (Addendum)
Unclear cause. Offered allergist eval, he declines today

## 2018-07-16 ENCOUNTER — Encounter: Payer: Self-pay | Admitting: Family Medicine

## 2018-07-16 DIAGNOSIS — K219 Gastro-esophageal reflux disease without esophagitis: Secondary | ICD-10-CM | POA: Insufficient documentation

## 2018-07-16 DIAGNOSIS — R140 Abdominal distension (gaseous): Secondary | ICD-10-CM

## 2018-07-16 NOTE — Assessment & Plan Note (Signed)
New diagnosis. Discussed with patient. rec avoid added sugars, sweetened beverages, watch simple carbs. Reassess at f/u in 4 months.

## 2018-07-16 NOTE — Assessment & Plan Note (Signed)
Not currently taking ranexa - was too expensive.  Suggested he touch base with cards.

## 2018-07-16 NOTE — Assessment & Plan Note (Signed)
Sees urology yearly.  

## 2018-07-16 NOTE — Assessment & Plan Note (Signed)
Chronic, stable. Continue current regimen. 

## 2018-07-16 NOTE — Assessment & Plan Note (Addendum)
Chronic, stable. Continue lipitor. Known CAD hx. The ASCVD Risk score Mikey Bussing DC Jr., et al., 2013) failed to calculate for the following reasons:   The valid total cholesterol range is 130 to 320 mg/dL

## 2018-07-16 NOTE — Assessment & Plan Note (Signed)
Encouraged healthy diet and lifestyle choices to affect sustainable weight loss.  ?

## 2018-07-16 NOTE — Assessment & Plan Note (Signed)
Ongoing - possible MOH - suggested he back off tylenol use. Will reassess at f/u visit.

## 2018-07-16 NOTE — Assessment & Plan Note (Signed)
Ex smoker, quit 2015, remains abstinent.  Discussed lung cancer screening - pt interested. Referral placed.

## 2018-07-16 NOTE — Assessment & Plan Note (Deleted)
Reviewed trend in A1c - now in controlled diabetes range. Discussed low sugar low carb diet. RTC 4 mo f/u visit.

## 2018-07-16 NOTE — Assessment & Plan Note (Signed)
rec avoid gas producing foods, trial gasX.

## 2018-07-16 NOTE — Assessment & Plan Note (Signed)
H/o migraines. Notes gabapentin worsens mood - suggested he decrease from BID to nightly.

## 2018-07-16 NOTE — Assessment & Plan Note (Signed)
LFTs normal

## 2018-07-17 ENCOUNTER — Telehealth: Payer: Self-pay | Admitting: *Deleted

## 2018-07-17 NOTE — Telephone Encounter (Signed)
Called patient to remind them of their appt for ldct screening on 07-18-2018 @ 1345, no answer unable to leave message

## 2018-07-18 ENCOUNTER — Inpatient Hospital Stay: Payer: PPO | Attending: Oncology | Admitting: Nurse Practitioner

## 2018-07-18 ENCOUNTER — Ambulatory Visit
Admission: RE | Admit: 2018-07-18 | Discharge: 2018-07-18 | Disposition: A | Discharge status: Home / Self Care | Payer: PPO | Source: Ambulatory Visit | Attending: Oncology | Admitting: Oncology

## 2018-07-18 ENCOUNTER — Encounter: Payer: Self-pay | Admitting: *Deleted

## 2018-07-18 DIAGNOSIS — Z87891 Personal history of nicotine dependence: Secondary | ICD-10-CM | POA: Insufficient documentation

## 2018-07-18 DIAGNOSIS — Z122 Encounter for screening for malignant neoplasm of respiratory organs: Secondary | ICD-10-CM

## 2018-07-18 NOTE — Progress Notes (Signed)
In accordance with CMS guidelines, patient has met eligibility criteria including age, absence of signs or symptoms of lung cancer.  Social History   Tobacco Use  . Smoking status: Former Smoker    Packs/day: 1.00    Years: 40.00    Pack years: 40.00    Types: Cigarettes    Last attempt to quit: 02/05/2014    Years since quitting: 4.4  . Smokeless tobacco: Never Used  Substance Use Topics  . Alcohol use: Yes    Alcohol/week: 0.0 standard drinks    Comment: 3-4 drinks per month  . Drug use: No      A shared decision-making session was conducted prior to the performance of CT scan. This includes one or more decision aids, includes benefits and harms of screening, follow-up diagnostic testing, over-diagnosis, false positive rate, and total radiation exposure.   Counseling on the importance of adherence to annual lung cancer LDCT screening, impact of co-morbidities, and ability or willingness to undergo diagnosis and treatment is imperative for compliance of the program.   Counseling on the importance of continued smoking cessation for former smokers; the importance of smoking cessation for current smokers, and information about tobacco cessation interventions have been given to patient including [place] and 1800 quit [place] programs.   Written order for lung cancer screening with LDCT has been given to the patient and any and all questions have been answered to the best of my abilities.    Yearly follow up will be coordinated by Burgess Estelle, Thoracic Navigator.  Beckey Rutter, DNP, AGNP-C [place] [facility] [phone] (work cell) [phone] (office) 07/18/18 2:36 PM

## 2018-07-24 DIAGNOSIS — H52201 Unspecified astigmatism, right eye: Secondary | ICD-10-CM | POA: Diagnosis not present

## 2018-07-24 DIAGNOSIS — Z961 Presence of intraocular lens: Secondary | ICD-10-CM | POA: Diagnosis not present

## 2018-07-24 DIAGNOSIS — H2511 Age-related nuclear cataract, right eye: Secondary | ICD-10-CM | POA: Diagnosis not present

## 2018-07-24 DIAGNOSIS — Z9841 Cataract extraction status, right eye: Secondary | ICD-10-CM | POA: Diagnosis not present

## 2018-07-25 DIAGNOSIS — H2512 Age-related nuclear cataract, left eye: Secondary | ICD-10-CM | POA: Diagnosis not present

## 2018-07-30 IMAGING — CT CT CTA ABD/PEL W/CM AND/OR W/O CM
2 of 10 series · 11 of 46 positions shown, 15 images · IV contrast (iopamidol)
Comparison: Multiple prior CT for comparison. Most recent CT
11/28/2015, 06/05/2014, initial CT 03/24/2012

CLINICAL DATA: 61-year-old male status post endovascular repair of
abdominal aortic aneurysm 6344.

EXAM:
CTA ABDOMEN AND PELVIS wITHOUT AND WITH CONTRAST
TECHNIQUE: Multidetector CT imaging of the abdomen and pelvis was performed
using the standard protocol during bolus administration of
intravenous contrast. Multiplanar reconstructed images and MIPs were
obtained and reviewed to evaluate the vascular anatomy.
CONTRAST:  75mL YWJTHB-I7P IOPAMIDOL (YWJTHB-I7P) INJECTION 76%

[Series 6: cta arterial 2.00 bv36 s3 axial st · axial · arterial · 0.90mm/px · z∈[+1105,+1485]mm · 9 of 228 slices shown, 13 images]
[im 19/228  soft-tissue]
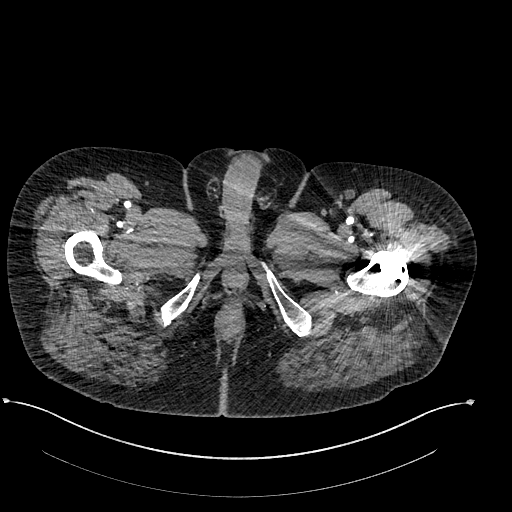
[im 19/228  bone]
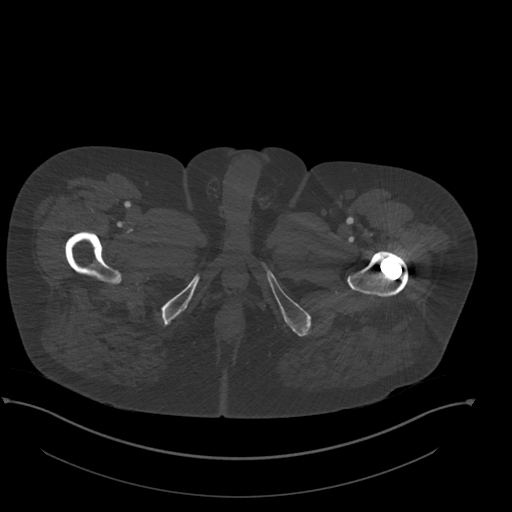
[im 57/228  soft-tissue]
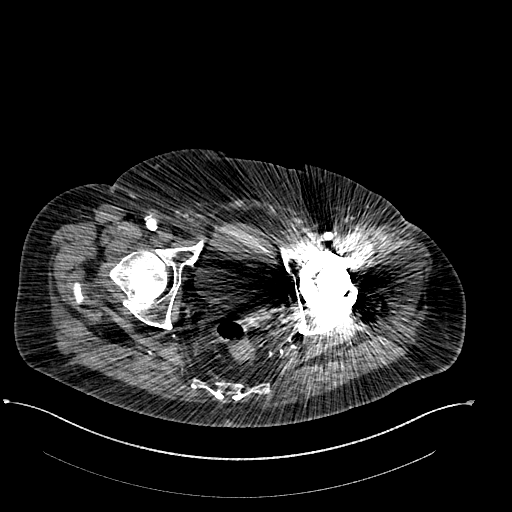
[im 76/228  soft-tissue]
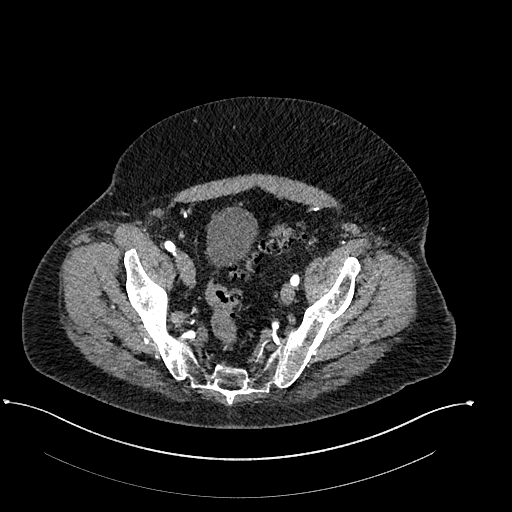
[im 95/228  soft-tissue]
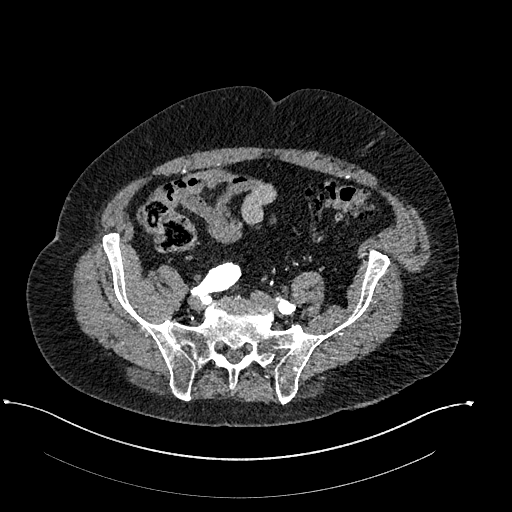
[im 133/228  soft-tissue]
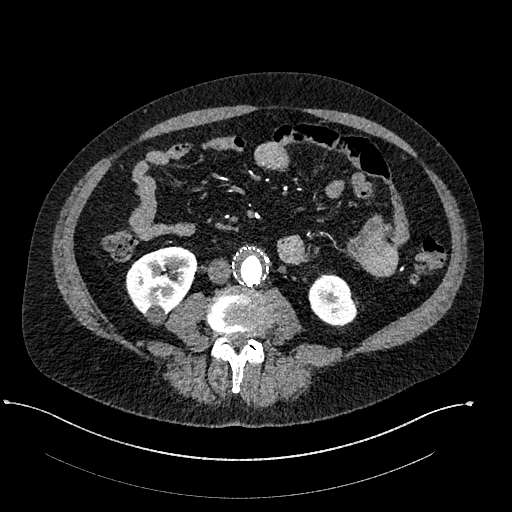
[im 152/228  soft-tissue]
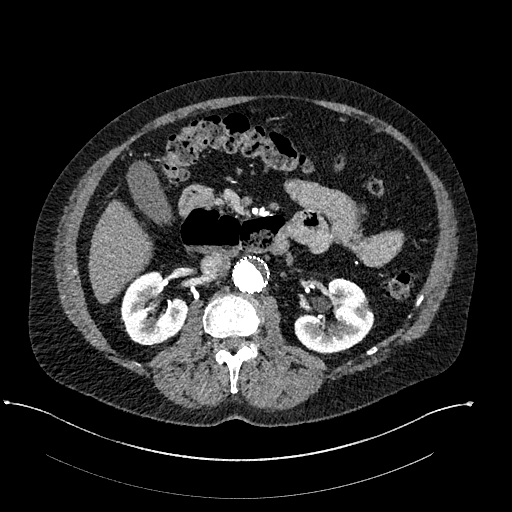
[im 152/228  lung]
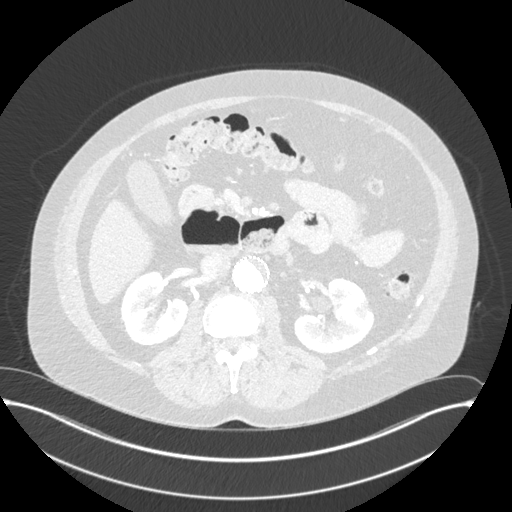
[im 171/228  soft-tissue]
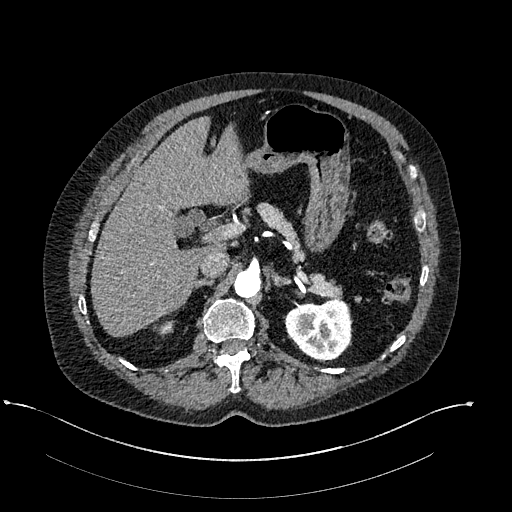
[im 171/228  lung]
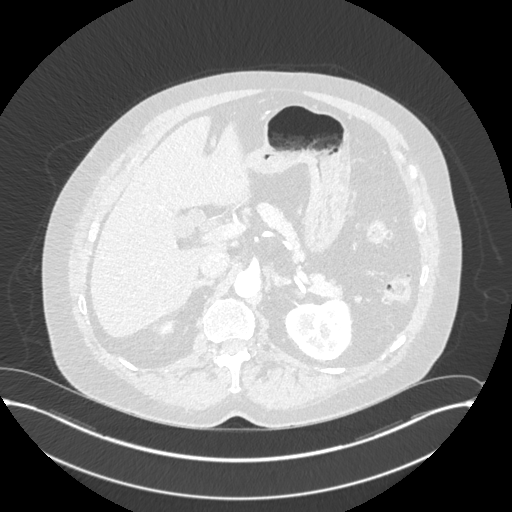
[im 190/228  lung]
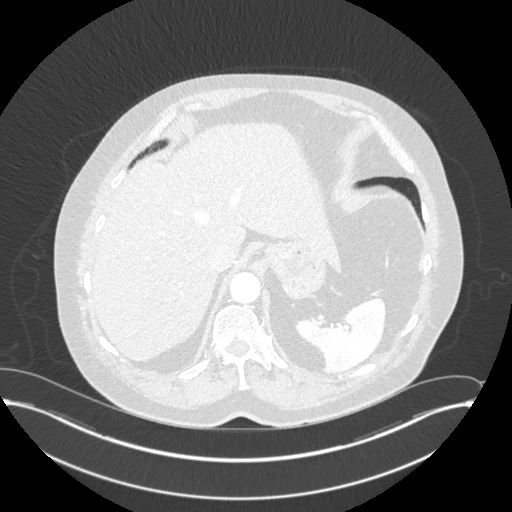
[im 209/228  soft-tissue]
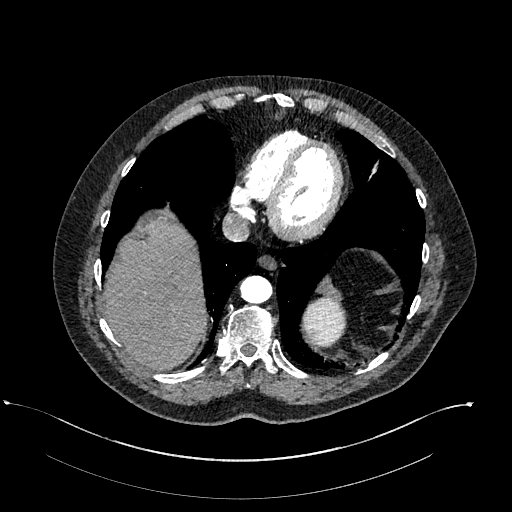
[im 209/228  lung]
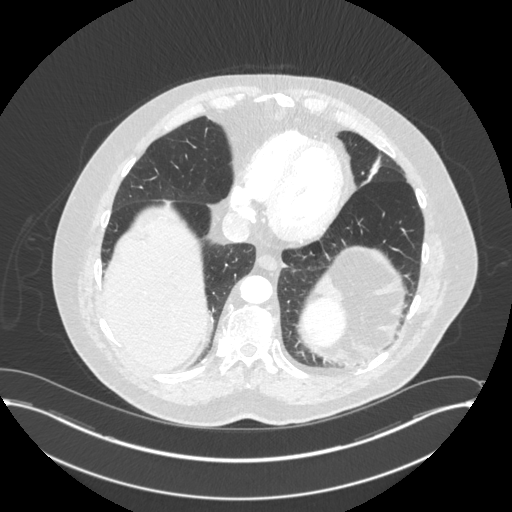

[Series 9: cta arterial 2.00 bv36 s3 cor cor art st · coronal · arterial · 0.83mm/px · 2 of 164 slices shown]
[im 55/164  soft-tissue]
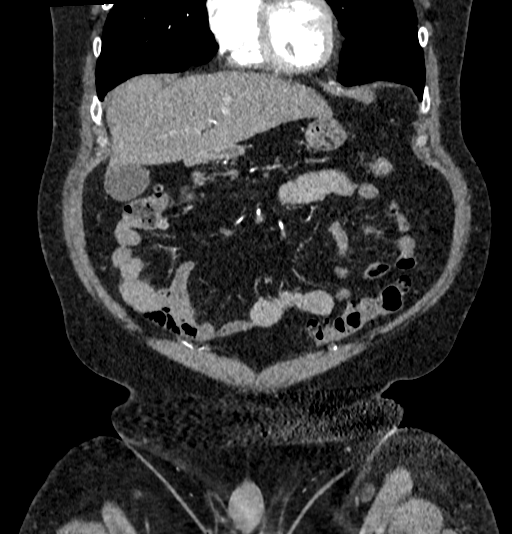
[im 109/164  soft-tissue]
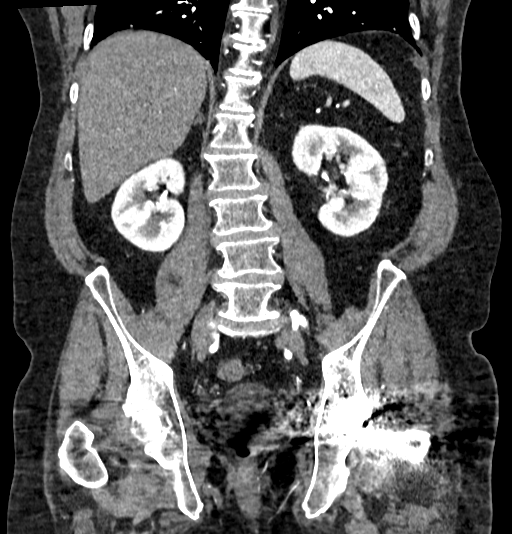

[11 of 46 positions shown; findings below may reference images not displayed]

FINDINGS: VASCULAR

Aorta: Redemonstration of endovascular repair of infrarenal
abdominal aortic aneurysm, with either suprarenal fixation with Gaul
Ranes device or a cuff.

The original diameter of the aorta measured 5.2 cm. There has been
excellent remodeling of the aorta with exclusion of the aneurysm
sac. There is very slight luminal thrombus/ingrowth of the stent
graft, with flow channel maintained through the main body and the
bilateral limbs. No periaortic fluid or inflammation.

Celiac: Celiac artery patent with mild atherosclerotic changes at
the origin.

SMA: Superior mesenteric artery remains patent with no significant
atherosclerotic changes at the origin.

Renals: Suprarenal fixation/cuffing has allowed maintenance of flow
within the 2 right renal arteries which are patent.

Left main renal artery remains patent with suprarenal fixation on
the left.

IMA: IMA is occluded at the origin with collateral flow filling the
left colic artery.

Right lower extremity:

The right limb remains patent terminating in the common iliac
artery. Hypogastric artery is patent with at least 50% stenosis at
the origin. Pelvic arteries patent.

Right external iliac artery is patent. Common femoral artery is
patent. Profunda femoris patent. Proximal SFA patent.

Left lower extremity:

Left iliac limb is patent terminating in the common iliac artery.
Hypogastric artery is patent with atherosclerotic changes at the
origin. External iliac artery patent. Common femoral artery patent.
Proximal SFA and profunda femoris patent.

Veins: Unremarkable appearance of the veins.

Review of the MIP images confirms the above findings.

NON-VASCULAR

Lower chest: Emphysema and scarring with no acute finding of the
lower chest.

Lower chest again demonstrates what appears to be a pseudoaneurysm
at the left ventricular apex, which is essentially unchanged dating
to the CT of 03/24/2012.

Evidence of median sternotomy.

Hepatobiliary: Unremarkable appearance of the liver. Unremarkable
gall bladder.

Pancreas: Unremarkable pancreas

Spleen: Unremarkable spleen

Adrenals/Urinary Tract: Unremarkable adrenal glands.

Right:

Right kidney without hydronephrosis or nephrolithiasis. Benign
appearing cyst at the lateral cortex. Unremarkable course of the
right ureter

Left

Left kidney without hydronephrosis or nephrolithiasis. Unremarkable
course of the left ureter

Unremarkable appearance of the urinary bladder.

Stomach/Bowel: Unremarkable appearance of the stomach. Unremarkable
appearance of small bowel. No evidence of obstruction. Colonic
diverticula. No associated inflammation. Normal appendix.

Lymphatic: No lymphadenopathy

Mesenteric: No free fluid or air. No adenopathy.

Reproductive: Unremarkable appearance of the pelvic organs

Other: No hernia.

Musculoskeletal: No acute displaced fracture. Degenerative changes
of the visualized spine. Surgical changes of left hip arthroplasty,
with streak artifact in the left pelvis
IMPRESSION: Redemonstration of endovascular repair of infrarenal abdominal
aortic aneurysm, with either suprarenal fixation with Orxideya Manyaki device
or a cuff. Patency is maintained of 2 right renal arteries, left
renal artery, and there has been excellent remodeling of the aorta
with no type 1 or type 2 endoleak.

Mild luminal mural thrombus within the main body of the device
without significant stenosis.

No acute finding within the abdomen/pelvis.

Redemonstration of small left ventricular apical pseudoaneurysm,
which appears essentially unchanged dating to the CT of 03/24/2012,
potentially sequela of a prior myocardial infarction..

## 2018-08-05 ENCOUNTER — Encounter: Payer: Self-pay | Admitting: Family Medicine

## 2018-08-05 DIAGNOSIS — I700 Atherosclerosis of aorta: Secondary | ICD-10-CM | POA: Insufficient documentation

## 2018-08-08 DIAGNOSIS — G4733 Obstructive sleep apnea (adult) (pediatric): Secondary | ICD-10-CM | POA: Diagnosis not present

## 2018-08-09 ENCOUNTER — Other Ambulatory Visit: Payer: Self-pay | Admitting: Pharmacist

## 2018-08-09 NOTE — Patient Outreach (Signed)
[facility]) Care Management  08/09/2018  Carl Lee August 25, 1955 [id number]   Incoming call from Kathleene Hazel Sokol in response to the [facility] Medication Adherence Campaign. Speak with patient. HIPAA identifiers verified and verbal consent received.  [name] reports that he takes his atorvastatin 20 mg once nightly as directed. Reports that he might miss a dose occasionally. Counsel patient that this particular cholesterol medication can be taken any time of day, including each morning, given its long half-life, if that helps with his remembering to take it, as long as it's taken at the same time each day. Patient verbalizes understanding. Counsel patient on the importance of adherence to this medication.  Patient asks about taking CBD oil for pain relief. Counsel patient that, unlike prescription medications, CBD oil is not FDA approved or regulated. Let patient know that there is insufficient data to confirm that it is safe and effective. Patient denies having yet talked to his PCP about this pain. Encourage patient to talk to his PCP first about options.  Patient denies any further medication questions/concerns at this time. Provide patient with my phone number.  Will close pharmacy episode.  Harlow Asa, PharmD, [facility] [phone]

## 2018-08-10 DIAGNOSIS — G4733 Obstructive sleep apnea (adult) (pediatric): Secondary | ICD-10-CM | POA: Diagnosis not present

## 2018-08-10 DIAGNOSIS — R269 Unspecified abnormalities of gait and mobility: Secondary | ICD-10-CM | POA: Diagnosis not present

## 2018-08-10 DIAGNOSIS — J449 Chronic obstructive pulmonary disease, unspecified: Secondary | ICD-10-CM | POA: Diagnosis not present

## 2018-08-14 DIAGNOSIS — H2512 Age-related nuclear cataract, left eye: Secondary | ICD-10-CM | POA: Diagnosis not present

## 2018-08-14 DIAGNOSIS — H52202 Unspecified astigmatism, left eye: Secondary | ICD-10-CM | POA: Diagnosis not present

## 2018-08-16 ENCOUNTER — Other Ambulatory Visit: Payer: Self-pay | Admitting: Family Medicine

## 2018-08-18 ENCOUNTER — Encounter: Payer: Self-pay | Admitting: Family Medicine

## 2018-08-18 ENCOUNTER — Ambulatory Visit (INDEPENDENT_AMBULATORY_CARE_PROVIDER_SITE_OTHER): Payer: PPO | Admitting: Family Medicine

## 2018-08-18 VITALS — BP 122/74 | HR 74 | Temp 97.8°F | Wt 210.8 lb

## 2018-08-18 DIAGNOSIS — J449 Chronic obstructive pulmonary disease, unspecified: Secondary | ICD-10-CM | POA: Diagnosis not present

## 2018-08-18 DIAGNOSIS — J0100 Acute maxillary sinusitis, unspecified: Secondary | ICD-10-CM | POA: Diagnosis not present

## 2018-08-18 MED ORDER — AZITHROMYCIN 250 MG PO TABS: ORAL_TABLET | ORAL | 0 refills | Status: DC

## 2018-08-18 MED ORDER — PREDNISONE 20 MG PO TABS: 20.0000 mg | ORAL_TABLET | Freq: Every day | ORAL | 0 refills | Status: AC

## 2018-08-18 NOTE — Progress Notes (Signed)
Subjective:    Patient ID: Carl Lee, male    DOB: [birth year], 63 y.o.   MRN: [id number]  HPI This is a 63 yo male who presents today with cough, nasal congestion, headache, facial/jaw pain x 4 days. Frontal headache. Feels winded with activity, wheezing. Right sided facial pain, fullness. Has been taking echinachia and Zinc, mucinex.Dm with improvement of cough.  Had cataract surgery Monday. Has been around sick people in public. No home sick contacts.   Past Medical History:  Diagnosis Date  . AAA (abdominal aortic aneurysm) ([place]) 03/25/12   s/p endovascular repair  . Adenomatous polyp 06/2009  . Atrial tachycardia, paroxysmal ([place]) 06/18/2016  . Bladder cancer ([place]) 08/16/2008   recurrence 2015 and 2018 treated with office fulguration  . CAD (coronary artery disease), native coronary artery    S/p 3v CABG 01/2014   . Cataract 2019   bilateral eyes  . Childhood asthma    as a child  . Complication of anesthesia    "takes me a year to get over"  . Contact dermatitis    atypical [name])  . Controlled diabetes mellitus type 2 with complications ([place]) 55/97/4163   CAD   . COPD (chronic obstructive pulmonary disease) ([place])   . Coronary artery disease    a.2015: CABG w/ LIMA-LAD, RIMA-OM, SVG-RCA b. Cath on 07/10/15 w/ patent LIMA-LAD and RIMA-OM. SVG-RCA occluded but collaterals present  . Diverticulosis 2013   severe by CT and colonoscopy  . Environmental allergies    improved as ages  . Fracture of lateral malleolus of left ankle 08/29/2013  . Headache    hasn't had migraines in years  . Hepatic steatosis 06/2015   by [place]  . Hx of migraines   . Hypertension   . Lumbosacral radiculopathy at S1 03/2012   left, with spinal stenosis (MRI 04/2013) improved with TF ESI L5/S1 and S1/2 ([place])  . OSA (obstructive sleep apnea) 09/04/2014   Severe with AHI 35/hr, uses BIPAP  . Overweight (BMI 25.0-29.9) 01/26/2017  . Pre-diabetes   . Prediabetes 06/08/2014   CAD   .  Resistance to clopidogrel 2015   drug metabolism panel run - asked to scan  . Spinal stenosis    LS1 nerve root impingement from bulging disc  . Vitamin B12 deficiency   . Zenker diverticulum    Past Surgical History:  Procedure Laterality Date  . ABDOMINAL AORTIC ANEURYSM REPAIR  03/25/12   endovascular  . ABI  05/2013   WNL, L TBI low at 0.66  . Bladder cancer  March 2010 and Oct. 2015   Ernst Spell) x 2  . CARDIAC CATHETERIZATION N/A 07/10/2015   Procedure: Left Heart Cath and Cors/Grafts Angiography;  Surgeon: [name], MD;  Location: [place] [facility];  Service: Cardiovascular;  Laterality: N/A;  . COLONOSCOPY  06/2012   hyperplastic polyp, diverticulosis ([place]) rec rpt 5 yrs  . COLONOSCOPY WITH PROPOFOL N/A 08/10/2017   TA, rpt 5 yrs Ardis Hughs, Melene Plan, MD)  . CORONARY ARTERY BYPASS GRAFT N/A 02/07/2014   Procedure: CORONARY ARTERY BYPASS GRAFTING (CABG);  Surgeon: [name], MD;  Location: [place];  Service: Open Heart Surgery;  Laterality: N/A;  CABG X 3, BILATERAL LIMA, EVH  . CYSTOSCOPY  08/16/08   Bladder Cancer  . EPIDURAL BLOCK INJECTION Left 09/2014, 10/2014, 12/2014   medial L2,3,4, dorsal L5 ramus blocks x2, L L5/S1 and S1/2 transforaminal ESI ([place])  . ESI  04/2013, 06/2013   L  L5S1, S12 transforaminal ESI (Dr.  [name])  . ESI Left 04/2014, 05/2014, 06/2014   L5/S1, S1/2; rpt; L4/5  . ESI  03/2016   R L5/S1 interlaminar ESI  . ESOPHAGEAL MANOMETRY N/A 08/10/2017   Procedure: ESOPHAGEAL MANOMETRY (EM);  Surgeon: [name], MD;  Location: [facility];  Service: Endoscopy;  Laterality: N/A;  . ESOPHAGOGASTRODUODENOSCOPY (EGD) WITH PROPOFOL N/A 08/10/2017   Procedure: ESOPHAGOGASTRODUODENOSCOPY (EGD) WITH PROPOFOL;  Surgeon: [name], MD;  Location: [facility];  Service: Endoscopy;  Laterality: N/A;  . 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  INTRAOPERATIVE TRANSESOPHAGEAL ECHOCARDIOGRAM N/A 02/07/2014   Procedure: INTRAOPERATIVE TRANSESOPHAGEAL ECHOCARDIOGRAM;  Surgeon: [name], MD;  Location: [place];  Service: Open Heart Surgery;  Laterality: N/A;  . LEFT HEART CATH AND CORS/GRAFTS ANGIOGRAPHY N/A 02/11/2017   Procedure: LEFT HEART CATH AND CORS/GRAFTS ANGIOGRAPHY;  Surgeon: [name], MD;  Location: [place] [facility];  Service: Cardiovascular;  Laterality: N/A;  . LEFT HEART CATHETERIZATION WITH CORONARY ANGIOGRAM N/A 02/01/2014   Procedure: LEFT HEART CATHETERIZATION WITH CORONARY ANGIOGRAM;  Surgeon: [name], MD;  [facility];  Service: Cardiovascular;  Laterality: N/A;  . PRP epidural injection Left 11/2015   L5/S1, S1/2 transforaminal epidural PRP injections under fluoroscopy ([place])  . TONSILLECTOMY AND ADENOIDECTOMY  1973  . TOTAL HIP ARTHROPLASTY Left 03/08/2017  . TOTAL HIP ARTHROPLASTY Left 03/08/2017   Procedure: LEFT TOTAL HIP ARTHROPLASTY ANTERIOR APPROACH;  Surgeon: [name], MD;  Location: [place];  Service: Orthopedics;  Laterality: Left;  [name] VASECTOMY    . ZENKER'S DIVERTICULECTOMY  09/2017   Family History  Problem Relation Age of Onset  . Diabetes Mother   . Arrhythmia Mother        pacemaker  . Cancer Mother        Bladder  . COPD Mother   . Thyroid disease Mother   . Hyperlipidemia Mother   . Hypertension Mother   . Diabetes Father   . CAD Father 49       CHF, MI  . Dementia Father   . Heart attack Father   . Diabetes Sister   . Heart attack Maternal Grandmother   . AAA (abdominal aortic aneurysm) Maternal Grandmother   . Colon cancer Neg Hx    Social History   Tobacco Use  . Smoking status: Former Smoker    Packs/day: 1.00    Years: 40.00    Pack years: 40.00    Types: Cigarettes    Last attempt to quit: 02/05/2014    Years since quitting: 4.5  . Smokeless tobacco: Never Used  Substance Use Topics  . Alcohol use: Yes     Alcohol/week: 0.0 standard drinks    Comment: 3-4 drinks per month  . Drug use: No      Review of Systems Per HPI    Objective:   Physical Exam Vitals signs reviewed.  Constitutional:      General: He is not in acute distress.    Appearance: He is well-developed. He is obese. He is ill-appearing. He is not toxic-appearing.  HENT:     Head: Normocephalic and atraumatic.     Right Ear: Tympanic membrane, ear canal and external ear normal.     Left Ear: Tympanic membrane, ear canal and external ear normal.     Nose: Congestion and rhinorrhea present.     Right Turbinates: Swollen.  Left Turbinates: Swollen.     Right Sinus: Maxillary sinus tenderness present. No frontal sinus tenderness.     Left Sinus: No maxillary sinus tenderness or frontal sinus tenderness.     Mouth/Throat:     Mouth: Mucous membranes are moist.  Eyes:     Conjunctiva/sclera: Conjunctivae normal.  Neck:     Musculoskeletal: Normal range of motion and neck supple. No neck rigidity or muscular tenderness.  Cardiovascular:     Rate and Rhythm: Normal rate and regular rhythm.     Heart sounds: Normal heart sounds.  Pulmonary:     Effort: Pulmonary effort is normal.     Breath sounds: Normal breath sounds.  Lymphadenopathy:     Cervical: No cervical adenopathy.  Skin:    General: Skin is warm and dry.  Neurological:     Mental Status: He is alert and oriented to person, place, and time.  Psychiatric:        Mood and Affect: Mood normal.        Behavior: Behavior normal.        Thought Content: Thought content normal.        Judgment: Judgment normal.       BP 122/74 (BP Location: Right Arm, Patient Position: Sitting, Cuff Size: Normal)   Pulse 74   Temp 97.8 F (36.6 C) (Oral)   Wt 210 lb 12 oz (95.6 kg)   SpO2 94%   BMI 30.68 kg/m  Wt Readings from Last 3 Encounters:  08/18/18 210 lb 12 oz (95.6 kg)  07/18/18 205 lb (93 kg)  07/14/18 210 lb 12 oz (95.6 kg)       Assessment & Plan:    1. Acute non-recurrent maxillary sinusitis -Provided written and verbal information regarding diagnosis and treatment. -  Patient Instructions  Good to see you today  I have sent in 5 days of prednisone and an antibiotic to your pharmacy  Please use your nebulizer 3-4 times a day as needed for chest tightness, SOB, wheeze  Continue Mucinex DM  IF not better in 5-7 days or if worse, please follow up   - azithromycin (ZITHROMAX) 250 MG tablet; Take 2 tabs PO x 1 dose, then 1 tab PO QD x 4 days  Dispense: 6 tablet; Refill: 0  2. COPD, moderate ([place]) - predniSONE (DELTASONE) 20 MG tablet; Take 1 tablet (20 mg total) by mouth daily with breakfast for 5 days.  Dispense: 5 tablet; Refill: 0   Clarene Reamer, FNP-BC  [facility]  08/20/2018 8:39 PM

## 2018-08-18 NOTE — Patient Instructions (Signed)
Good to see you today  I have sent in 5 days of prednisone and an antibiotic to your pharmacy  Please use your nebulizer 3-4 times a day as needed for chest tightness, SOB, wheeze  Continue Mucinex DM  IF not better in 5-7 days or if worse, please follow up

## 2018-08-20 ENCOUNTER — Encounter: Payer: Self-pay | Admitting: Family Medicine

## 2018-08-21 DIAGNOSIS — H2512 Age-related nuclear cataract, left eye: Secondary | ICD-10-CM | POA: Diagnosis not present

## 2018-08-21 DIAGNOSIS — H25012 Cortical age-related cataract, left eye: Secondary | ICD-10-CM | POA: Diagnosis not present

## 2018-08-28 ENCOUNTER — Telehealth: Payer: Self-pay | Admitting: Gastroenterology

## 2018-08-28 NOTE — Telephone Encounter (Signed)
PT advise that was sent over to [facility] for surgery and wants to follow up for a visit.[name] Please advised for scheduling.

## 2018-08-28 NOTE — Telephone Encounter (Signed)
appt made to see Dr Ardis Hughs to f/u after [place] zenkers surgery.  Has dysphagia begining again.

## 2018-08-29 ENCOUNTER — Other Ambulatory Visit: Payer: Self-pay | Admitting: Pulmonary Disease

## 2018-08-30 ENCOUNTER — Ambulatory Visit (INDEPENDENT_AMBULATORY_CARE_PROVIDER_SITE_OTHER)
Admission: RE | Admit: 2018-08-30 | Discharge: 2018-08-30 | Disposition: A | Discharge status: Home / Self Care | Payer: PPO | Source: Ambulatory Visit | Attending: Family Medicine | Admitting: Family Medicine

## 2018-08-30 ENCOUNTER — Encounter: Payer: Self-pay | Admitting: Family Medicine

## 2018-08-30 ENCOUNTER — Ambulatory Visit (INDEPENDENT_AMBULATORY_CARE_PROVIDER_SITE_OTHER): Payer: PPO | Admitting: Family Medicine

## 2018-08-30 VITALS — BP 114/66 | HR 60 | Temp 98.4°F | Ht 69.5 in | Wt 215.5 lb

## 2018-08-30 DIAGNOSIS — R062 Wheezing: Secondary | ICD-10-CM

## 2018-08-30 DIAGNOSIS — R0602 Shortness of breath: Secondary | ICD-10-CM

## 2018-08-30 DIAGNOSIS — R059 Cough, unspecified: Secondary | ICD-10-CM

## 2018-08-30 DIAGNOSIS — R05 Cough: Secondary | ICD-10-CM

## 2018-08-30 DIAGNOSIS — J449 Chronic obstructive pulmonary disease, unspecified: Secondary | ICD-10-CM | POA: Diagnosis not present

## 2018-08-30 MED ORDER — UMECLIDINIUM-VILANTEROL 62.5-25 MCG/INH IN AEPB: 1.0000 | INHALATION_SPRAY | Freq: Every day | RESPIRATORY_TRACT | 2 refills | Status: DC

## 2018-08-30 MED ORDER — ALBUTEROL SULFATE (2.5 MG/3ML) 0.083% IN NEBU: 2.5000 mg | INHALATION_SOLUTION | Freq: Four times a day (QID) | RESPIRATORY_TRACT | 12 refills | Status: DC | PRN

## 2018-08-30 MED ORDER — ALBUTEROL SULFATE HFA 108 (90 BASE) MCG/ACT IN AERS: 2.0000 | INHALATION_SPRAY | RESPIRATORY_TRACT | 2 refills | Status: DC | PRN

## 2018-08-30 MED ORDER — PREDNISONE 20 MG PO TABS: ORAL_TABLET | ORAL | 0 refills | Status: DC

## 2018-08-30 MED ORDER — BENZONATATE 100 MG PO CAPS: 100.0000 mg | ORAL_CAPSULE | Freq: Three times a day (TID) | ORAL | 0 refills | Status: DC | PRN

## 2018-08-30 MED ORDER — ALBUTEROL SULFATE (2.5 MG/3ML) 0.083% IN NEBU
2.5000 mg | INHALATION_SOLUTION | Freq: Once | RESPIRATORY_TRACT | Status: AC
  Administered 2018-08-30: 2.5 mg via RESPIRATORY_TRACT

## 2018-08-30 NOTE — Patient Instructions (Signed)
Good to see you today, I am sorry you are not all better by now  I have sent a higher dose, longer prednisone course to your pharmacy, please start today  I have also sent in benzonate cough pills. Continue daily Mucinex and good fluid intake to keep urine light yellow  You can take acetaminophen (Tylenol) for sore throat as well as do warm salt water gargles as needed

## 2018-08-30 NOTE — Progress Notes (Signed)
I reviewed health advisor's note, was available for consultation, and agree with documentation and plan.  

## 2018-08-30 NOTE — Procedures (Signed)
Lumbosacral Transforaminal Epidural Steroid Injection - Sub-Pedicular Approach with Fluoroscopic Guidance  Patient: Carl Lee      Date of Birth: [birth year] MRN: [id number] PCP: [name], MD      Visit Date: 06/27/2018   Universal Protocol:    Date/Time: 06/27/2018  Consent Given By: the patient  Position: PRONE  Additional Comments: Vital signs were monitored before and after the procedure. Patient was prepped and draped in the usual sterile fashion. The correct patient, procedure, and site was verified.   Injection Procedure Details:  Procedure Site One Meds Administered:  Meds ordered this encounter  Medications  . betamethasone acetate-betamethasone sodium phosphate (CELESTONE) injection 12 mg    Laterality: Right  Location/Site:  L4-L5  Needle size: 22 G  Needle type: Spinal  Needle Placement: Transforaminal  Findings:    -Comments: There was good flow of contrast into the foramen and at least into the canal and epidurally.  Flow was not in a great pattern but there was very stenotic foramen.  More than you would expect from the MRI images.  Procedure Details: After squaring off the end-plates to get a true AP view, the C-arm was positioned so that an oblique view of the foramen as noted above was visualized. The target area is just inferior to the "nose of the scotty dog" or sub pedicular. The soft tissues overlying this structure were infiltrated with 2-3 ml. of 1% Lidocaine without Epinephrine.  The spinal needle was inserted toward the target using a "trajectory" view along the fluoroscope beam.  Under AP and lateral visualization, the needle was advanced so it did not puncture dura and was located close the 6 O'Clock position of the pedical in AP tracterory. Biplanar projections were used to confirm position. Aspiration was confirmed to be negative for CSF and/or blood. A 1-2 ml. volume of Isovue-250 was injected and flow of contrast was noted at  each level. Radiographs were obtained for documentation purposes.   After attaining the desired flow of contrast documented above, a 0.5 to 1.0 ml test dose of 0.25% Marcaine was injected into each respective transforaminal space.  The patient was observed for 90 seconds post injection.  After no sensory deficits were reported, and normal lower extremity motor function was noted,   the above injectate was administered so that equal amounts of the injectate were placed at each foramen (level) into the transforaminal epidural space.   Additional Comments:  No complications occurred Dressing: Band-Aid    Post-procedure details: Patient was observed during the procedure. Post-procedure instructions were reviewed.  Patient left the clinic in stable condition.

## 2018-08-30 NOTE — Progress Notes (Signed)
Carl Lee - 63 y.o. male MRN [id number]  Date of birth: 10/19/1955  Office Visit Note: Visit Date: 06/27/2018 PCP: [name], MD Referred by: [name], MD  Subjective: Chief Complaint  Patient presents with  . Lower Back - Pain  . Right Hip - Pain  . Right Thigh - Pain   HPI:  Carl Lee is a 63 y.o. male who comes in today For planned right L4 transforaminal epidural steroid injection.  Patient's prior greater trochanteric injection gave him quite a bit of relief over the lateral thigh pain that he was having which was fairly acute greater trochanteric pain.  He had prior greater trochanteric injection in the office without fluoroscopic guidance without much relief.  I am not sure if this is just the need for very specific placement along the trochanter but he did get pretty exquisite pain with the injection and now he has gotten some relief.  He still having some pain on the right thigh it is more anterior lateral across the knee.  Prior MRI shows mild to moderate stenosis at L4-5 with some foraminal narrowing on the right with arthritis.  He had more left-sided issues with disc herniation but at the time we saw him this seemed to be more related to his hip which is since gone on to have left total hip replacement.  I think right L4 injection would be worthwhile diagnostically hopefully therapeutically.  If he does not get much relief may warrant updated MRI of the lumbar spine.  ROS Otherwise per HPI.  Assessment & Plan: Visit Diagnoses:  1. Lumbar radiculopathy     Plan: No additional findings.   Meds & Orders:  Meds ordered this encounter  Medications  . betamethasone acetate-betamethasone sodium phosphate (CELESTONE) injection 12 mg    Orders Placed This Encounter  Procedures  . XR C-ARM NO REPORT  . Epidural Steroid injection    Follow-up: Return if symptoms worsen or fail to improve, for [name], MD and [name], PA-C.    Procedures: No procedures performed  Lumbosacral Transforaminal Epidural Steroid Injection - Sub-Pedicular Approach with Fluoroscopic Guidance  Patient: Carl Lee      Date of Birth: 01/29/1956 MRN: [id number] PCP: [name], MD      Visit Date: 06/27/2018   Universal Protocol:    Date/Time: 06/27/2018  Consent Given By: the patient  Position: PRONE  Additional Comments: Vital signs were monitored before and after the procedure. Patient was prepped and draped in the usual sterile fashion. The correct patient, procedure, and site was verified.   Injection Procedure Details:  Procedure Site One Meds Administered:  Meds ordered this encounter  Medications  . betamethasone acetate-betamethasone sodium phosphate (CELESTONE) injection 12 mg    Laterality: Right  Location/Site:  L4-L5  Needle size: 22 G  Needle type: Spinal  Needle Placement: Transforaminal  Findings:    -Comments: There was good flow of contrast into the foramen and at least into the canal and epidurally.  Flow was not in a great pattern but there was very stenotic foramen.  More than you would expect from the MRI images.  Procedure Details: After squaring off the end-plates to get a true AP view, the C-arm was positioned so that an oblique view of the foramen as noted above was visualized. The target area is just inferior to the "nose of the scotty dog" or sub pedicular. The soft tissues overlying this structure were infiltrated with 2-3 ml. of 1% Lidocaine  without Epinephrine.  The spinal needle was inserted toward the target using a "trajectory" view along the fluoroscope beam.  Under AP and lateral visualization, the needle was advanced so it did not puncture dura and was located close the 6 O'Clock position of the pedical in AP tracterory. Biplanar projections were used to confirm position. Aspiration was confirmed to be negative for CSF and/or blood. A 1-2 ml. volume of Isovue-250 was  injected and flow of contrast was noted at each level. Radiographs were obtained for documentation purposes.   After attaining the desired flow of contrast documented above, a 0.5 to 1.0 ml test dose of 0.25% Marcaine was injected into each respective transforaminal space.  The patient was observed for 90 seconds post injection.  After no sensory deficits were reported, and normal lower extremity motor function was noted,   the above injectate was administered so that equal amounts of the injectate were placed at each foramen (level) into the transforaminal epidural space.   Additional Comments:  No complications occurred Dressing: Band-Aid    Post-procedure details: Patient was observed during the procedure. Post-procedure instructions were reviewed.  Patient left the clinic in stable condition.    Clinical History: MRI LUMBAR SPINE WITHOUT CONTRAST  TECHNIQUE: Multiplanar, multisequence MR imaging of the lumbar spine was performed. No intravenous contrast was administered.  COMPARISON:  None.  FINDINGS: Severe susceptibility artifact resulting from the aorto bi-iliac stent graft which obscures portions of the lumbar spine suspect she on the STIR images.  Segmentation:  Standard.  Alignment:  Physiologic.  Vertebrae: Limited evaluation secondary to cystitis did of the artifact. No fracture, evidence of discitis, or bone lesion.  Conus medullaris: Extends to the L1 level and appears normal.  Paraspinal and other soft tissues: Severe susceptibility artifact resulting from the aorto bi-iliac stent graft .  Disc levels:  Disc spaces: Disc heights are relatively well maintained, but evaluation is limited.  T12-L1: No significant disc protrusion. No evidence of neural foraminal stenosis. No central canal stenosis.  L1-L2: Mild broad-based disc bulge. Mild bilateral facet arthropathy. No evidence of neural foraminal stenosis. No central canal  stenosis.  L2-L3: Small central disc protrusion. Mild bilateral facet arthropathy. No evidence of neural foraminal stenosis. No central canal stenosis.  L3-L4: Moderate broad-based disc bulge flattening the ventral thecal sac. Mild bilateral facet arthropathy. Mild spinal stenosis. No foraminal stenosis.  L4-L5: Moderate broad-based disc bulge eccentric towards the right. Moderate bilateral facet arthropathy. Mild -moderate spinal stenosis. No evidence of neural foraminal stenosis.  L5-S1: Broad-based disc bulge with a left paracentral disc protrusion abutting the left intraspinal S1 nerve root. No evidence of neural foraminal stenosis. No central canal stenosis.  IMPRESSION: 1. Lumbar spine spondylosis as described above. Limitations as described above secondary to susceptibility artifact resulting from the aortic stent graft. 2. At L4-5 there is a moderate broad-based disc bulge eccentric towards the right. Moderate bilateral facet arthropathy. Mild -moderate spinal stenosis. 3. At L5-S1 there is a broad-based disc bulge with a left paracentral disc protrusion abutting the left intraspinal S1 nerve root.   Electronically Signed   By: Kathreen Devoid   On: 11/27/2015 12:13     Objective:  VS:  HT:    WT:   BMI:     BP:134/76  HR:69bpm  TEMP:98.1 F (36.7 C)(Oral)  RESP:  Physical Exam  Ortho Exam Imaging: No results found.

## 2018-08-30 NOTE — Progress Notes (Signed)
Subjective:    Patient ID: Carl Lee, male    DOB: [birth year], 63 y.o.   MRN: [id number]  HPI This is a 63 yo male who presents today with continued nasal congestion and sore throat. Was seen 08/18/18 by me and diagnosed with acute non recurrent maxillary sinusitis, copd, moderate and given Zpack and prednisone 20 mg x 5 days. Has had decreased nasal drainage, smaller amounts of grey green sputum, white at times, continued headache with cough. Nebulizer treatments BID, some improvement immediately after using but feels winded with any activity. Requests refills of inhalers today, has upcoming follow up with pulmonary.   Past Medical History:  Diagnosis Date  . AAA (abdominal aortic aneurysm) ([place]) 03/25/12   s/p endovascular repair  . Adenomatous polyp 06/2009  . Atrial tachycardia, paroxysmal ([place]) 06/18/2016  . Bladder cancer ([place]) 08/16/2008   recurrence 2015 and 2018 treated with office fulguration  . CAD (coronary artery disease), native coronary artery    S/p 3v CABG 01/2014   . Cataract 2019   bilateral eyes  . Childhood asthma    as a child  . Complication of anesthesia    "takes me a year to get over"  . Contact dermatitis    atypical [name])  . Controlled diabetes mellitus type 2 with complications ([place]) 92/05/9416   CAD   . COPD (chronic obstructive pulmonary disease) ([place])   . Coronary artery disease    a.2015: CABG w/ LIMA-LAD, RIMA-OM, SVG-RCA b. Cath on 07/10/15 w/ patent LIMA-LAD and RIMA-OM. SVG-RCA occluded but collaterals present  . Diverticulosis 2013   severe by CT and colonoscopy  . Environmental allergies    improved as ages  . Fracture of lateral malleolus of left ankle 08/29/2013  . Headache    hasn't had migraines in years  . Hepatic steatosis 06/2015   by [place]  . Hx of migraines   . Hypertension   . Lumbosacral radiculopathy at S1 03/2012   left, with spinal stenosis (MRI 04/2013) improved with TF ESI L5/S1 and S1/2 ([place])  . OSA  (obstructive sleep apnea) 09/04/2014   Severe with AHI 35/hr, uses BIPAP  . Overweight (BMI 25.0-29.9) 01/26/2017  . Pre-diabetes   . Prediabetes 06/08/2014   CAD   . Resistance to clopidogrel 2015   drug metabolism panel run - asked to scan  . Spinal stenosis    LS1 nerve root impingement from bulging disc  . Vitamin B12 deficiency   . Zenker diverticulum    Past Surgical History:  Procedure Laterality Date  . ABDOMINAL AORTIC ANEURYSM REPAIR  03/25/12   endovascular  . ABI  05/2013   WNL, L TBI low at 0.66  . Bladder cancer  March 2010 and Oct. 2015   [facility]nst Spell) x 2  . CARDIAC CATHETERIZATION N/A 07/10/2015   Procedure: Left Heart Cath and Cors/Grafts Angiography;  Surgeon: [name], MD;  Location: [facility];  Service: Cardiovascular;  Laterality: N/A;  . COLONOSCOPY  06/2012   hyperplastic polyp, diverticulosis ([place]) rec rpt 5 yrs  . COLONOSCOPY WITH PROPOFOL N/A 08/10/2017   TA, rpt 5 yrs Ardis Hughs, Melene Plan, MD)  . CORONARY ARTERY BYPASS GRAFT N/A 02/07/2014   Procedure: CORONARY ARTERY BYPASS GRAFTING (CABG);  Surgeon: [name], MD;  Location: [facility];  Service: Open Heart Surgery;  Laterality: N/A;  CABG X 3, BILATERAL LIMA, EVH  . CYSTOSCOPY  08/16/08   Bladder Cancer  . EPIDURAL BLOCK INJECTION Left 09/2014, 10/2014, 12/2014  medial L2,3,4, dorsal L5 ramus blocks x2, L L5/S1 and S1/2 transforaminal ESI ([place])  . ESI  04/2013, 06/2013   L L5S1, S12 transforaminal ESI (Dr.  [name])  . ESI Left 04/2014, 05/2014, 06/2014   L5/S1, S1/2; rpt; L4/5  . ESI  03/2016   R L5/S1 interlaminar ESI  . ESOPHAGEAL MANOMETRY N/A 08/10/2017   Procedure: ESOPHAGEAL MANOMETRY (EM);  Surgeon: [name], MD;  Location: [facility];  Service: Endoscopy;  Laterality: N/A;  . ESOPHAGOGASTRODUODENOSCOPY (EGD) WITH PROPOFOL N/A 08/10/2017   Procedure: ESOPHAGOGASTRODUODENOSCOPY (EGD) WITH PROPOFOL;  Surgeon: [name], MD;  Location: [facility];  Service: Endoscopy;  Laterality: N/A;  . FULGURATION OF BLADDER TUMOR  01/2017   recurrent 40mm L lateral wall papillary transitional cell carcinoma ([place])  . INTRAOPERATIVE TRANSESOPHAGEAL ECHOCARDIOGRAM N/A 02/07/2014   Procedure: INTRAOPERATIVE TRANSESOPHAGEAL ECHOCARDIOGRAM;  Surgeon: [name], MD;  Location: [facility];  Service: Open Heart Surgery;  Laterality: N/A;  . LEFT HEART CATH AND CORS/GRAFTS ANGIOGRAPHY N/A 02/11/2017   Procedure: LEFT HEART CATH AND CORS/GRAFTS ANGIOGRAPHY;  Surgeon: [name], MD;  Location: [facility];  Service: Cardiovascular;  Laterality: N/A;  . LEFT HEART CATHETERIZATION WITH CORONARY ANGIOGRAM N/A 02/01/2014   Procedure: LEFT HEART CATHETERIZATION WITH CORONARY ANGIOGRAM;  Surgeon: [name], MD;  Location: [facility];  Service: Cardiovascular;  Laterality: N/A;  . PRP epidural injection Left 11/2015   L5/S1, S1/2 transforaminal epidural PRP injections under fluoroscopy ([place])  . TONSILLECTOMY AND ADENOIDECTOMY  1973  . TOTAL HIP ARTHROPLASTY Left 03/08/2017  . TOTAL HIP ARTHROPLASTY Left 03/08/2017   Procedure: LEFT TOTAL HIP ARTHROPLASTY ANTERIOR APPROACH;  Surgeon: [name], MD;  Location: [facility];  Service: Orthopedics;  Laterality: Left;  [name] VASECTOMY    . ZENKER'S DIVERTICULECTOMY  09/2017   Family History  Problem Relation Age of Onset  . Diabetes Mother   . Arrhythmia Mother        pacemaker  . Cancer Mother        Bladder  . COPD Mother   . Thyroid disease Mother   . Hyperlipidemia Mother   . Hypertension Mother   . Diabetes Father   . CAD Father 25       CHF, MI  . Dementia Father   . Heart attack Father   . Diabetes Sister   . Heart attack Maternal Grandmother   . AAA (abdominal aortic aneurysm) Maternal Grandmother   . Colon cancer Neg Hx    Social History   Tobacco Use  . Smoking status: Former Smoker    Packs/day: 1.00    Years: 40.00    Pack years: 40.00      Types: Cigarettes    Last attempt to quit: 02/05/2014    Years since quitting: 4.5  . Smokeless tobacco: Never Used  Substance Use Topics  . Alcohol use: Yes    Alcohol/week: 0.0 standard drinks    Comment: 3-4 drinks per month  . Drug use: No      Review of Systems Per HPI    Objective:   Physical Exam Vitals signs reviewed.  Constitutional:      General: He is not in acute distress.    Appearance: He is well-developed. He is obese. He is not toxic-appearing.  HENT:     Head: Normocephalic and atraumatic.     Right Ear: Tympanic membrane and ear canal normal.     Left Ear: Tympanic membrane normal.  Nose: Congestion and rhinorrhea present.     Mouth/Throat:     Mouth: Mucous membranes are moist.     Pharynx: Uvula midline. Posterior oropharyngeal erythema present. No pharyngeal swelling or oropharyngeal exudate.  Neck:     Musculoskeletal: Normal range of motion. No neck rigidity or muscular tenderness.  Cardiovascular:     Rate and Rhythm: Normal rate and regular rhythm.     Heart sounds: Normal heart sounds.  Pulmonary:     Effort: Pulmonary effort is normal. No respiratory distress.     Breath sounds: No stridor. Wheezing (right posterior LL) present. No rhonchi or rales.  Musculoskeletal:     Right lower leg: No edema.     Left lower leg: No edema.  Lymphadenopathy:     Cervical: No cervical adenopathy.  Skin:    General: Skin is warm and dry.  Neurological:     Mental Status: He is alert and oriented to person, place, and time.  Psychiatric:        Mood and Affect: Mood normal.        Behavior: Behavior normal.        Thought Content: Thought content normal.        Judgment: Judgment normal.          BP 114/66 (BP Location: Left Arm, Patient Position: Sitting, Cuff Size: Normal)   Pulse 60   Temp 98.4 F (36.9 C) (Oral)   Ht 5' 9.5" (1.765 m)   Wt 215 lb 8 oz (97.8 kg)   SpO2 94%   BMI 31.37 kg/m  Wt Readings from Last 3 Encounters:   08/30/18 215 lb 8 oz (97.8 kg)  08/18/18 210 lb 12 oz (95.6 kg)  07/18/18 205 lb (93 kg)   Given albuterol neb in office, little improvement in wheeze, subjective symptoms Assessment & Plan:  1. COPD, moderate ([place]) - will refill his maintenance meds today - suspect continued COPD flare s/p URI, will get CXR to rule out pneumonia and provide prednisone taper - Provided written and verbal information regarding diagnosis and treatment. - umeclidinium-vilanterol (ANORO ELLIPTA) 62.5-25 MCG/INH AEPB; Inhale 1 puff into the lungs daily.  Dispense: 60 each; Refill: 2 - albuterol (PROAIR HFA) 108 (90 Base) MCG/ACT inhaler; Inhale 2 puffs into the lungs every 4 (four) hours as needed for wheezing or shortness of breath.  Dispense: 8.5 Inhaler; Refill: 2 - albuterol (PROVENTIL) (2.5 MG/3ML) 0.083% nebulizer solution; Take 3 mLs (2.5 mg total) by nebulization every 6 (six) hours as needed for wheezing or shortness of breath.  Dispense: 75 mL; Refill: 12 - DG Chest 2 View; Future - predniSONE (DELTASONE) 20 MG tablet; Three x 3 days, 2 x 3 days, 1 x 5 days  Dispense: 20 tablet; Refill: 0 - discussed expectations of care and he is to notify office if worsening or if no improvement in 48 hours  2. SOB (shortness of breath) - albuterol (PROVENTIL) (2.5 MG/3ML) 0.083% nebulizer solution 2.5 mg - DG Chest 2 View; Future - predniSONE (DELTASONE) 20 MG tablet; Three x 3 days, 2 x 3 days, 1 x 5 days  Dispense: 20 tablet; Refill: 0  3. Wheeze - albuterol (PROVENTIL) (2.5 MG/3ML) 0.083% nebulizer solution 2.5 mg - DG Chest 2 View; Future - predniSONE (DELTASONE) 20 MG tablet; Three x 3 days, 2 x 3 days, 1 x 5 days  Dispense: 20 tablet; Refill: 0  4. Cough - benzonatate (TESSALON) 100 MG capsule; Take 1-2 capsules (100-200 mg total) by mouth 3 (  three) times daily as needed.  Dispense: 30 capsule; Refill: 0   Clarene Reamer, FNP-BC  [facility]  08/30/2018 1:51 PM

## 2018-09-04 ENCOUNTER — Other Ambulatory Visit: Payer: Self-pay | Admitting: Family Medicine

## 2018-09-08 NOTE — Progress Notes (Signed)
Cardiology Office Note:    Date:  09/12/2018   ID:  Carl Lee, Carl Lee September 02, 1955, MRN [id number]  PCP:  [name], MD  Cardiologist:  Dr. [name]   Electrophysiologist:  N/a Pulmonology: Dr. [name] GI: Dr. Ardis Hughs  Referring MD: [name], MD   No chief complaint on file.   History of Present Illness:    Carl Lee is a 63 y.o. male with a hx of CAD s/p CABG in 2015 with Dr. [name] (L-LAD, RIMA-OM, S-PDA), recurrent bladder CA, carotid artery disease, AAA s/p endovascular repair in 9/13 by Dr. [name], HTN, HL, DM2, OSA, tobacco abuse.   Had cath 02/11/17 for preop right hip surgery clearance. Stable with patent LIMA to mid LAD, RIMA to OM2 Occluded SVG PDA native RCA fills bridging collaterals EF 45-50% EF was normal by TTE done 11/11/15  Uneventful right THR Dr [name] 03/09/17 Continued left hip pain with L5 transforaminal injection on 05/29/18  VVS follows PVD with last [place] 11/24/15 residual AAA 4.0 cm   Dr Radford Pax follows his CPAP   He continues to have multiple types of chest pain ntolerant to imdur with bad headache Gave it a good 3 weekTrial and headache did not improve Started on Ranexa But this also made him feel poorly and more anxious  On prednisone for two weeks for bronchitis still wheezy having trouble communicating with Dr Milton Ferguson His pulmonologist Primary ended up prescribing steroids. Lung cancer screening CT ok 07/18/18 Still smoking less than ppd    Prior CV studies that were reviewed today include:    Holter Monitor:  Reviewed 03/25/16   SR average HR 72 Isolated PAC;s / PVC;s Some short runs 10-15 beats atrial tachycardia  Echo 11/11/15 Vigorous LVF, EF 65-70%, no RWMA, Gr 1 DD, reduced RVSF, PASP 24 mmHg  24 Hr BP Monitor 4/17 Avg BP in normal range  LHC 02/11/17 reviewed Conclusion     Prox RCA lesion, 100 %stenosed.  Origin to Prox Graft lesion, 100 %stenosed.  Ost LAD to Prox LAD lesion, 100 %stenosed.  LIMA and is  normal in caliber.  3rd Mrg lesion, 40 %stenosed.  RIMA and is normal in caliber.  Ost Cx lesion, 55 %stenosed.  Prox Cx lesion, 20 %stenosed.  There is mild left ventricular systolic dysfunction.   Mild LV dysfunction with an ejection fraction of 45-50% with mild mid to basal inferior hypocontractility and a nipple appearing focal apical aneurysm.  Significant native CAD with total occlusion of the proximal LAD after a prominent septal perforating artery; 55% stenosis prior to an ectatic aneurysmal segment in the proximal circumflex 20% mild narrowing between the OM1 and OM 2 with retrograde filling into the OM 2 via the RIMA graft; and total occlusion of the proximal RCA with bridging collaterals to the mid RCA and collaterals arising from the distal circumflex and septal perforating artery of the LAD supplying the very distal branch vessels.  Patent LIMA graft supplying the mid LAD.  Patent RIMA graft supplying the OM 2 vessel of the circumflex  Old occluded vein graft, which had supplied the PDA region.  RECOMMENDATION: Medical therapy.  The patient will return to the care of Dr. Roosvelt Harps in [place].  Based on his anatomy, which essentially is unchanged from 2 years ago,  cardiac clearance can be given for his hip surgery.         Carotid US (7/15):  Bilateral ICA 1-39%  Past Medical History:  Diagnosis Date  . AAA (abdominal aortic  aneurysm) ([place]) 03/25/12   s/p endovascular repair  . Adenomatous polyp 06/2009  . Atrial tachycardia, paroxysmal ([place]) 06/18/2016  . Bladder cancer ([place]) 08/16/2008   recurrence 2015 and 2018 treated with office fulguration  . CAD (coronary artery disease), native coronary artery    S/p 3v CABG 01/2014   . Cataract 2019   bilateral eyes  . Childhood asthma    as a child  . Complication of anesthesia    "takes me a year to get over"  . Contact dermatitis    atypical [name])  . Controlled diabetes mellitus type 2 with  complications ([place]) 40/02/6760   CAD   . COPD (chronic obstructive pulmonary disease) ([place])   . Coronary artery disease    a.2015: CABG w/ LIMA-LAD, RIMA-OM, SVG-RCA b. Cath on 07/10/15 w/ patent LIMA-LAD and RIMA-OM. SVG-RCA occluded but collaterals present  . Diverticulosis 2013   severe by CT and colonoscopy  . Environmental allergies    improved as ages  . Fracture of lateral malleolus of left ankle 08/29/2013  . Headache    hasn't had migraines in years  . Hepatic steatosis 06/2015   by [place]  . Hx of migraines   . Hypertension   . Lumbosacral radiculopathy at S1 03/2012   left, with spinal stenosis (MRI 04/2013) improved with TF ESI L5/S1 and S1/2 ([place])  . OSA (obstructive sleep apnea) 09/04/2014   Severe with AHI 35/hr, uses BIPAP  . Overweight (BMI 25.0-29.9) 01/26/2017  . Pre-diabetes   . Prediabetes 06/08/2014   CAD   . Resistance to clopidogrel 2015   drug metabolism panel run - asked to scan  . Spinal stenosis    LS1 nerve root impingement from bulging disc  . Vitamin B12 deficiency   . Zenker diverticulum     Past Surgical History:  Procedure Laterality Date  . ABDOMINAL AORTIC ANEURYSM REPAIR  03/25/12   endovascular  . ABI  05/2013   WNL, L TBI low at 0.66  . Bladder cancer  March 2010 and Oct. 2015   [facility] Spell) x 2  . CARDIAC CATHETERIZATION N/A 07/10/2015   Procedure: Left Heart Cath and Cors/Grafts Angiography;  Surgeon: [name], MD;  Location: [facility];  Service: Cardiovascular;  Laterality: N/A;  . COLONOSCOPY  06/2012   hyperplastic polyp, diverticulosis ([place]) rec rpt 5 yrs  . COLONOSCOPY WITH PROPOFOL N/A 08/10/2017   TA, rpt 5 yrs Ardis Hughs, Melene Plan, MD)  . CORONARY ARTERY BYPASS GRAFT N/A 02/07/2014   Procedure: CORONARY ARTERY BYPASS GRAFTING (CABG);  Surgeon: [name], MD;  Location: [facility];  Service: Open Heart Surgery;  Laterality: N/A;  CABG X 3, BILATERAL LIMA, EVH  . CYSTOSCOPY  08/16/08   Bladder  Cancer  . EPIDURAL BLOCK INJECTION Left 09/2014, 10/2014, 12/2014   medial L2,3,4, dorsal L5 ramus blocks x2, L L5/S1 and S1/2 transforaminal ESI ([place])  . ESI  04/2013, 06/2013   L L5S1, S12 transforaminal ESI (Dr.  [name])  . ESI Left 04/2014, 05/2014, 06/2014   L5/S1, S1/2; rpt; L4/5  . ESI  03/2016   R L5/S1 interlaminar ESI  . ESOPHAGEAL MANOMETRY N/A 08/10/2017   Procedure: ESOPHAGEAL MANOMETRY (EM);  Surgeon: [name], MD;  Location: [facility];  Service: Endoscopy;  Laterality: N/A;  . ESOPHAGOGASTRODUODENOSCOPY (EGD) WITH PROPOFOL N/A 08/10/2017   Procedure: ESOPHAGOGASTRODUODENOSCOPY (EGD) WITH PROPOFOL;  Surgeon: [name], MD;  Location: [facility];  Service: Endoscopy;  Laterality: N/A;  .  FULGURATION OF BLADDER TUMOR  01/2017   recurrent 39mm L lateral wall papillary transitional cell carcinoma ([place])  . INTRAOPERATIVE TRANSESOPHAGEAL ECHOCARDIOGRAM N/A 02/07/2014   Procedure: INTRAOPERATIVE TRANSESOPHAGEAL ECHOCARDIOGRAM;  Surgeon: [name], MD;  Location: [facility];  Service: Open Heart Surgery;  Laterality: N/A;  . LEFT HEART CATH AND CORS/GRAFTS ANGIOGRAPHY N/A 02/11/2017   Procedure: LEFT HEART CATH AND CORS/GRAFTS ANGIOGRAPHY;  Surgeon: [name], MD;  Location: [facility];  Service: Cardiovascular;  Laterality: N/A;  . LEFT HEART CATHETERIZATION WITH CORONARY ANGIOGRAM N/A 02/01/2014   Procedure: LEFT HEART CATHETERIZATION WITH CORONARY ANGIOGRAM;  Surgeon: [name], MD;  Location: [facility];  Service: Cardiovascular;  Laterality: N/A;  . PRP epidural injection Left 11/2015   L5/S1, S1/2 transforaminal epidural PRP injections under fluoroscopy ([place])  . TONSILLECTOMY AND ADENOIDECTOMY  1973  . TOTAL HIP ARTHROPLASTY Left 03/08/2017  . TOTAL HIP ARTHROPLASTY Left 03/08/2017   Procedure: LEFT TOTAL HIP ARTHROPLASTY ANTERIOR APPROACH;  Surgeon: [name], MD;  Location: [facility];  Service:  Orthopedics;  Laterality: Left;  [name] VASECTOMY    . ZENKER'S DIVERTICULECTOMY  09/2017    Current Medications: Current Meds  Medication Sig  . acetaminophen (TYLENOL) 325 MG tablet Take 2 tablets (650 mg total) by mouth every 6 (six) hours as needed for moderate pain or headache.  . albuterol (PROAIR HFA) 108 (90 Base) MCG/ACT inhaler Inhale 2 puffs into the lungs every 4 (four) hours as needed for wheezing or shortness of breath.  [name] albuterol (PROVENTIL) (2.5 MG/3ML) 0.083% nebulizer solution Take 3 mLs (2.5 mg total) by nebulization every 6 (six) hours as needed for wheezing or shortness of breath.  [name] amLODipine (NORVASC) 5 MG tablet TAKE 1 TABLET BY MOUTH EVERY DAY  . aspirin EC 325 MG EC tablet Take 1 tablet (325 mg total) by mouth daily.  [name] atorvastatin (LIPITOR) 20 MG tablet TAKE 1 TABLET (20 MG TOTAL) BY MOUTH DAILY AT 6 PM.  . famotidine (PEPCID) 20 MG tablet TAKE 1 TABLET (20 MG TOTAL) BY MOUTH 2 (TWO) TIMES DAILY.  [name] gabapentin (NEURONTIN) 300 MG capsule Take 1 capsule (300 mg total) by mouth at bedtime. First 3 days take once nightly  . glucose blood (ONE TOUCH ULTRA TEST) test strip CHECK ONCE DAILY AND AS NEEDED  . metoprolol tartrate (LOPRESSOR) 25 MG tablet Take 0.5 tablets (12.5 mg total) by mouth 2 (two) times daily.  [name] oxyCODONE-acetaminophen (PERCOCET/ROXICET) 5-325 MG tablet Take 1-2 tablets by mouth every 4 (four) hours as needed for severe pain.  [name] PROLENSA 0.07 % SOLN Place 1 drop into both eyes at bedtime.  [name] Propylene Glycol (SYSTANE BALANCE) 0.6 % SOLN Place 1 drop into both eyes daily as needed (for dry eyes).  [name] umeclidinium-vilanterol (ANORO ELLIPTA) 62.5-25 MCG/INH AEPB Inhale 1 puff into the lungs daily.  . vitamin B-12 (CYANOCOBALAMIN) 500 MCG tablet Take 1 tablet (500 mcg total) by mouth every other day.     Allergies:   Codeine and Doxycycline   Social History   Socioeconomic History  . Marital status: Married    Spouse name: Not on file  . Number of  children: 2  . Years of education: Not on file  . Highest education level: Not on file  Occupational History  . Occupation: Self Employed  Social Needs  . Financial resource strain: Not on file  . Food insecurity:    Worry: Not on file    Inability: Not on file  .  Transportation needs:    Medical: Not on file    Non-medical: Not on file  Tobacco Use  . Smoking status: Former Smoker    Packs/day: 1.00    Years: 40.00    Pack years: 40.00    Types: Cigarettes    Last attempt to quit: 02/05/2014    Years since quitting: 4.6  . Smokeless tobacco: Never Used  Substance and Sexual Activity  . Alcohol use: Yes    Alcohol/week: 0.0 standard drinks    Comment: 3-4 drinks per month  . Drug use: No  . Sexual activity: Not Currently  Lifestyle  . Physical activity:    Days per week: Not on file    Minutes per session: Not on file  . Stress: Not on file  Relationships  . Social connections:    Talks on phone: Not on file    Gets together: Not on file    Attends religious service: Not on file    Active member of club or organization: Not on file    Attends meetings of clubs or organizations: Not on file    Relationship status: Not on file  Other Topics Concern  . Not on file  Social History Narrative   Caffeine: 1 cup coffee/day   Lives with wife, 1 dog   grown children   Occupation: Therapist, sports - self employed.  Disability after CABG   Edu: 2 yrs college   Activity: fishing, walking occasionally   Diet: good water, fruits/vegetables daily     Family History:  The patient's family history includes AAA (abdominal aortic aneurysm) in his maternal grandmother; Arrhythmia in his mother; CAD (age of onset: 46) in his father; COPD in his mother; Cancer in his mother; Dementia in his father; Diabetes in his father, mother, and sister; Heart attack in his father and maternal grandmother; Hyperlipidemia in his mother; Hypertension in his mother; Thyroid disease in his mother.     ROS:   Please see the history of present illness.    Review of Systems  Constitution: Positive for malaise/fatigue.  Eyes: Positive for visual disturbance.  Cardiovascular: Positive for chest pain, dyspnea on exertion and irregular heartbeat.  Respiratory: Positive for wheezing.   Hematologic/Lymphatic: Bruises/bleeds easily.  Skin: Positive for rash.  Musculoskeletal: Positive for back pain.   All other systems reviewed and are negative.   EKGs/Labs/Other Test Reviewed:    EKG:  11/10/17 SR rate 61 nonspecific ST changes   Recent Labs: 07/13/2018: ALT 8; BUN 25; Creatinine, Ser 0.80; Potassium 4.7; Sodium 139; TSH 2.20   Recent Lipid Panel    Component Value Date/Time   CHOL 115 07/13/2018 0851   CHOL 170 01/05/2012   TRIG 115.0 07/13/2018 0851   TRIG 141 01/05/2012   HDL 46.50 07/13/2018 0851   CHOLHDL 2 07/13/2018 0851   VLDL 23.0 07/13/2018 0851   LDLCALC 46 07/13/2018 0851   LDLDIRECT 62.0 12/28/2017 0827     Physical Exam:    VS:  BP 108/66   Pulse 75   Ht 5\' 10"  (1.778 m)   Wt 98.9 kg   SpO2 98%   BMI 31.30 kg/m     Wt Readings from Last 3 Encounters:  09/12/18 98.9 kg  08/30/18 97.8 kg  08/18/18 95.6 kg     Affect appropriate Overweight male  HEENT: normal Neck supple with no adenopathy JVP normal no bruits no thyromegaly Lungs  diffuse  wheezing and good diaphragmatic motion Heart:  S1/S2 no murmur, no rub, gallop  or click PMI normal post sternotomy  Abdomen: benighn, BS positve, no tenderness, no AAA no bruit.  No HSM or HJR Distal pulses intact with no bruits No edema Neuro non-focal Skin warm and dry No muscular weakness Post left THR    ASSESSMENT:    1. Coronary artery disease involving coronary bypass graft of native heart, angina presence unspecified   2. Essential hypertension   3. Dyslipidemia   4. S/P AAA (abdominal aortic aneurysm) repair   5. Palpitations   6. Dyspnea, unspecified type   7. Chronic fatigue    PLAN:     In order of problems listed above:  1. CAD - s/p CABG. Cath 02/2017 stable anatomy Continues to have chest pain ? InsufficientCollaterals to RCA. Intolerant to imdur due to headache  Now on Ranexa 500 bid. Also try capzasin cream to chest at night for chest wall pain Do not think he needs another heart cath at this time   2. HTN - Well controlled.  Continue current medications and low sodium Dash type diet.    3. HL - Continue statin. Labs with primary   4. AAA s/p Endovascular repair - FU with VVS.    5. Fatigue - This is a chronic issue. Labs including TSH ok .  6. Palpitations - Holter 03/2016 PAC;s/ PVC;s benign   7. Zenker's Diverticulum - F/U with Dr Ardis Hughs   8. OSA:  CPAP titration per Dr Radford Pax compliant   9. Ortho:  Post left THR with bursitis continue PT/OT and analgesics   10/  Dyspnea:  EF 45-50% cath 02/11/17 LVEDP only 3 and post A wave 16 mmHg  Last TTE 11/11/15 EF 65-70%   He has significant asthmatic COPD just finished course steroids needs f/u with Dr [name] admits to not using inhalers the way he should I think a lot of his "chest pain/ tightness " Is related to COPD      [name] MD [facility]

## 2018-09-09 DIAGNOSIS — R269 Unspecified abnormalities of gait and mobility: Secondary | ICD-10-CM | POA: Diagnosis not present

## 2018-09-09 DIAGNOSIS — J449 Chronic obstructive pulmonary disease, unspecified: Secondary | ICD-10-CM | POA: Diagnosis not present

## 2018-09-09 DIAGNOSIS — G4733 Obstructive sleep apnea (adult) (pediatric): Secondary | ICD-10-CM | POA: Diagnosis not present

## 2018-09-12 ENCOUNTER — Encounter: Payer: Self-pay | Admitting: Cardiovascular Disease

## 2018-09-12 ENCOUNTER — Ambulatory Visit: Payer: PPO | Admitting: Cardiovascular Disease

## 2018-09-12 VITALS — BP 108/66 | HR 75 | Ht 70.0 in | Wt 218.1 lb

## 2018-09-12 DIAGNOSIS — Z8679 Personal history of other diseases of the circulatory system: Secondary | ICD-10-CM | POA: Diagnosis not present

## 2018-09-12 DIAGNOSIS — I10 Essential (primary) hypertension: Secondary | ICD-10-CM | POA: Diagnosis not present

## 2018-09-12 DIAGNOSIS — E785 Hyperlipidemia, unspecified: Secondary | ICD-10-CM

## 2018-09-12 DIAGNOSIS — R002 Palpitations: Secondary | ICD-10-CM | POA: Diagnosis not present

## 2018-09-12 DIAGNOSIS — R0600 Dyspnea, unspecified: Secondary | ICD-10-CM

## 2018-09-12 DIAGNOSIS — I25810 Atherosclerosis of coronary artery bypass graft(s) without angina pectoris: Secondary | ICD-10-CM

## 2018-09-12 DIAGNOSIS — Z9889 Other specified postprocedural states: Secondary | ICD-10-CM

## 2018-09-12 DIAGNOSIS — R5382 Chronic fatigue, unspecified: Secondary | ICD-10-CM | POA: Diagnosis not present

## 2018-09-12 NOTE — Patient Instructions (Addendum)

## 2018-09-17 DIAGNOSIS — G4733 Obstructive sleep apnea (adult) (pediatric): Secondary | ICD-10-CM | POA: Diagnosis not present

## 2018-09-17 DIAGNOSIS — J449 Chronic obstructive pulmonary disease, unspecified: Secondary | ICD-10-CM | POA: Diagnosis not present

## 2018-09-17 DIAGNOSIS — R269 Unspecified abnormalities of gait and mobility: Secondary | ICD-10-CM | POA: Diagnosis not present

## 2018-09-26 ENCOUNTER — Other Ambulatory Visit: Payer: Self-pay

## 2018-09-26 ENCOUNTER — Encounter: Payer: Self-pay | Admitting: Gastroenterology

## 2018-09-26 ENCOUNTER — Ambulatory Visit (INDEPENDENT_AMBULATORY_CARE_PROVIDER_SITE_OTHER): Payer: PPO | Admitting: Gastroenterology

## 2018-09-26 VITALS — BP 142/80 | HR 82 | Temp 98.2°F | Ht 69.0 in | Wt 219.0 lb

## 2018-09-26 DIAGNOSIS — R070 Pain in throat: Secondary | ICD-10-CM | POA: Diagnosis not present

## 2018-09-26 DIAGNOSIS — R1310 Dysphagia, unspecified: Secondary | ICD-10-CM | POA: Diagnosis not present

## 2018-09-26 DIAGNOSIS — K225 Diverticulum of esophagus, acquired: Secondary | ICD-10-CM

## 2018-09-26 NOTE — Progress Notes (Signed)
Review of pertinent gastrointestinal problems: 1. H/o adenomatous colon polyps: colonoscopy December 2013;  one small polyp that was removed and was a hyperplastic polyp.  Colonoscopy 06/2009 two adenomas, one was 33mm.  Colonoscopy January 2019 found for subcentimeter polyps, 2 of them were adenomas.  Recommended repeat colonoscopy at 5-year interval 2. Symptomatic Zenker's diverticulum, Barium esophagram 04/2014: small zenker's diverticululm felt likely to be causing dysphagia 2015 GI evaluation: Evaluation with Dr. Servando Snare 08/2014: dysphagia had improved, he declined further workup.   Barium esophagram 04/2016: "Zenker's diverticulum which fills with contrast without clearing."   January 2019 high resolution esophageal manometry was normal.  He was referred to Dr. Zenia Resides at [facility] for Zenker's myotomy.  March 2019 EGD with Dr. Zenia Resides at [facility] cricopharyngeal myotomy, "preventative" endoscopic clips were placed to prevent bleeding.  He was noted to have a medium sized hiatal hernia and possible short segment of Barrett's change.  Not biopsied.   HPI: This is a very pleasant 63 year old man whom I last saw over a year ago.  I referred him to Dr. Zenia Resides at [facility] for Zenker's myotomy.  Following the endoscopic Zenkers myotomy, swallowing improved significantly after the myotomy but still had some left sided throat pains.  Has been gaining weight.  About 96month ago swallowing issues.  Feels food is lodging in the left side of his throat.  Stabbing pain in left pain.  No fevers or chills.  Chief complaint is dysphasia, left-sided throat pain  ROS: complete GI ROS as described in HPI, all other review negative.  Constitutional:  No unintentional weight loss   Past Medical History:  Diagnosis Date  . AAA (abdominal aortic aneurysm) ([place]) 03/25/12   s/p endovascular repair  . Adenomatous polyp 06/2009  . Atrial tachycardia, paroxysmal ([place]) 06/18/2016  . Bladder cancer ([place]) 08/16/2008    recurrence 2015 and 2018 treated with office fulguration  . CAD (coronary artery disease), native coronary artery    S/p 3v CABG 01/2014   . Cataract 2019   bilateral eyes  . Childhood asthma    as a child  . Complication of anesthesia    "takes me a year to get over"  . Contact dermatitis    atypical [name])  . Controlled diabetes mellitus type 2 with complications ([place]) 09/62/8366   CAD   . COPD (chronic obstructive pulmonary disease) ([place])   . Coronary artery disease    a.2015: CABG w/ LIMA-LAD, RIMA-OM, SVG-RCA b. Cath on 07/10/15 w/ patent LIMA-LAD and RIMA-OM. SVG-RCA occluded but collaterals present  . Diverticulosis 2013   severe by CT and colonoscopy  . Environmental allergies    improved as ages  . Fracture of lateral malleolus of left ankle 08/29/2013  . Headache    hasn't had migraines in years  . Hepatic steatosis 06/2015   by [place]  . Hx of migraines   . Hypertension   . Lumbosacral radiculopathy at S1 03/2012   left, with spinal stenosis (MRI 04/2013) improved with TF ESI L5/S1 and S1/2 ([place])  . OSA (obstructive sleep apnea) 09/04/2014   Severe with AHI 35/hr, uses BIPAP  . Overweight (BMI 25.0-29.9) 01/26/2017  . Pre-diabetes   . Prediabetes 06/08/2014   CAD   . Resistance to clopidogrel 2015   drug metabolism panel run - asked to scan  . Spinal stenosis    LS1 nerve root impingement from bulging disc  . Vitamin B12 deficiency   . Zenker diverticulum     Past Surgical History:  Procedure Laterality Date  . ABDOMINAL AORTIC ANEURYSM REPAIR  03/25/12   endovascular  . ABI  05/2013   WNL, L TBI low at 0.66  . Bladder cancer  March 2010 and Oct. 2015   Ernst Spell) x 2  . CARDIAC CATHETERIZATION N/A 07/10/2015   Procedure: Left Heart Cath and Cors/Grafts Angiography;  Surgeon: [name], MD;  Location: [place] [facility];  Service: Cardiovascular;  Laterality: N/A;  . COLONOSCOPY  06/2012   hyperplastic polyp, diverticulosis ([place])  rec rpt 5 yrs  . COLONOSCOPY WITH PROPOFOL N/A 08/10/2017   TA, rpt 5 yrs Ardis Hughs, Melene Plan, MD)  . CORONARY ARTERY BYPASS GRAFT N/A 02/07/2014   Procedure: CORONARY ARTERY BYPASS GRAFTING (CABG);  Surgeon: [name], MD;  Location: [place];  Service: Open Heart Surgery;  Laterality: N/A;  CABG X 3, BILATERAL LIMA, EVH  . CYSTOSCOPY  08/16/08   Bladder Cancer  . EPIDURAL BLOCK INJECTION Left 09/2014, 10/2014, 12/2014   medial L2,3,4, dorsal L5 ramus blocks x2, L L5/S1 and S1/2 transforaminal ESI ([place])  . ESI  04/2013, 06/2013   L L5S1, S12 transforaminal ESI (Dr.  [name])  . ESI Left 04/2014, 05/2014, 06/2014   L5/S1, S1/2; rpt; L4/5  . ESI  03/2016   R L5/S1 interlaminar ESI  . ESOPHAGEAL MANOMETRY N/A 08/10/2017   Procedure: ESOPHAGEAL MANOMETRY (EM);  Surgeon: [name], MD;  Location: [facility];  Service: Endoscopy;  Laterality: N/A;  . ESOPHAGOGASTRODUODENOSCOPY (EGD) WITH PROPOFOL N/A 08/10/2017   Procedure: ESOPHAGOGASTRODUODENOSCOPY (EGD) WITH PROPOFOL;  Surgeon: [name], MD;  Location: [facility];  Service: Endoscopy;  Laterality: N/A;  . FULGURATION OF BLADDER TUMOR  01/2017   recurrent 65mm L lateral wall papillary transitional cell carcinoma ([place])  . INTRAOPERATIVE TRANSESOPHAGEAL ECHOCARDIOGRAM N/A 02/07/2014   Procedure: INTRAOPERATIVE TRANSESOPHAGEAL ECHOCARDIOGRAM;  Surgeon: [name], MD;  Location: [name];  Service: Open Heart Surgery;  Laterality: N/A;  . LEFT HEART CATH AND CORS/GRAFTS ANGIOGRAPHY N/A 02/11/2017   Procedure: LEFT HEART CATH AND CORS/GRAFTS ANGIOGRAPHY;  Surgeon: [name], MD;  Location: [place] [facility];  Service: Cardiovascular;  Laterality: N/A;  . LEFT HEART CATHETERIZATION WITH CORONARY ANGIOGRAM N/A 02/01/2014   Procedure: LEFT HEART CATHETERIZATION WITH CORONARY ANGIOGRAM;  Surgeon: [name], MD;  Location: [facility] C[facility];  Service: Cardiovascular;  Laterality: N/A;  . PRP epidural  injection Left 11/2015   L5/S1, S1/2 transforaminal epidural PRP injections under fluoroscopy ([place])  . REPLACEMENT TOTAL HIP W/  RESURFACING IMPLANTS Left   . TONSILLECTOMY AND ADENOIDECTOMY  1973  . TOTAL HIP ARTHROPLASTY Left 03/08/2017  . TOTAL HIP ARTHROPLASTY Left 03/08/2017   Procedure: LEFT TOTAL HIP ARTHROPLASTY ANTERIOR APPROACH;  Surgeon: [name], MD;  Location: [place];  Service: Orthopedics;  Laterality: Left;  [name] VASECTOMY    . ZENKER'S DIVERTICULECTOMY  09/2017    Current Outpatient Medications  Medication Sig Dispense Refill  . acetaminophen (TYLENOL) 325 MG tablet Take 2 tablets (650 mg total) by mouth every 6 (six) hours as needed for moderate pain or headache.    . albuterol (PROAIR HFA) 108 (90 Base) MCG/ACT inhaler Inhale 2 puffs into the lungs every 4 (four) hours as needed for wheezing or shortness of breath. 8.5 Inhaler 2  . albuterol (PROVENTIL) (2.5 MG/3ML) 0.083% nebulizer solution Take 3 mLs (2.5 mg total) by nebulization every 6 (six) hours as needed for wheezing or shortness of breath. 75 mL 12  . amLODipine (  NORVASC) 5 MG tablet TAKE 1 TABLET BY MOUTH EVERY DAY 90 tablet 3  . aspirin EC 325 MG EC tablet Take 1 tablet (325 mg total) by mouth daily.    [name] atorvastatin (LIPITOR) 20 MG tablet TAKE 1 TABLET (20 MG TOTAL) BY MOUTH DAILY AT 6 PM. 90 tablet 3  . famotidine (PEPCID) 20 MG tablet TAKE 1 TABLET (20 MG TOTAL) BY MOUTH 2 (TWO) TIMES DAILY. 180 tablet 1  . gabapentin (NEURONTIN) 300 MG capsule Take 1 capsule (300 mg total) by mouth at bedtime. First 3 days take once nightly 30 capsule 11  . glucose blood (ONE TOUCH ULTRA TEST) test strip CHECK ONCE DAILY AND AS NEEDED    . metoprolol tartrate (LOPRESSOR) 25 MG tablet Take 0.5 tablets (12.5 mg total) by mouth 2 (two) times daily. 90 tablet 3  . oxyCODONE-acetaminophen (PERCOCET/ROXICET) 5-325 MG tablet Take 1-2 tablets by mouth every 4 (four) hours as needed for severe pain. 30 tablet 0  .  umeclidinium-vilanterol (ANORO ELLIPTA) 62.5-25 MCG/INH AEPB Inhale 1 puff into the lungs daily. 60 each 2  . vitamin B-12 (CYANOCOBALAMIN) 500 MCG tablet Take 1 tablet (500 mcg total) by mouth every other day.     No current facility-administered medications for this visit.     Allergies as of 09/26/2018 - Review Complete 09/26/2018  Allergen Reaction Noted  . Codeine Other (See Comments) 03/24/2012  . Doxycycline Hives and Other (See Comments) 06/24/2015    Family History  Problem Relation Age of Onset  . Diabetes Mother   . Arrhythmia Mother        pacemaker  . COPD Mother   . Thyroid disease Mother   . Hyperlipidemia Mother   . Hypertension Mother   . Bladder Cancer Mother   . Diabetes Father   . CAD Father 15       CHF, MI  . Dementia Father   . Heart attack Father   . Diabetes Sister   . Heart attack Maternal Grandmother   . AAA (abdominal aortic aneurysm) Maternal Grandmother   . Colon cancer Neg Hx   . Stomach cancer Neg Hx     Social History   Socioeconomic History  . Marital status: Married    Spouse name: Not on file  . Number of children: 2  . Years of education: Not on file  . Highest education level: Not on file  Occupational History  . Occupation: Self Employed  Social Needs  . Financial resource strain: Not on file  . Food insecurity:    Worry: Not on file    Inability: Not on file  . Transportation needs:    Medical: Not on file    Non-medical: Not on file  Tobacco Use  . Smoking status: Former Smoker    Packs/day: 1.00    Years: 40.00    Pack years: 40.00    Types: Cigarettes    Last attempt to quit: 02/05/2014    Years since quitting: 4.6  . Smokeless tobacco: Never Used  Substance and Sexual Activity  . Alcohol use: Yes    Alcohol/week: 0.0 standard drinks    Comment: 3-4 drinks per month  . Drug use: No  . Sexual activity: Not Currently  Lifestyle  . Physical activity:    Days per week: Not on file    Minutes per session: Not  on file  . Stress: Not on file  Relationships  . Social connections:    Talks on phone: Not on file  Gets together: Not on file    Attends religious service: Not on file    Active member of club or organization: Not on file    Attends meetings of clubs or organizations: Not on file    Relationship status: Not on file  . Intimate partner violence:    Fear of current or ex partner: Not on file    Emotionally abused: Not on file    Physically abused: Not on file    Forced sexual activity: Not on file  Other Topics Concern  . Not on file  Social History Narrative   Caffeine: 1 cup coffee/day   Lives with wife, 1 dog   grown children   Occupation: Therapist, sports - self employed.  Disability after CABG   Edu: 2 yrs college   Activity: fishing, walking occasionally   Diet: good water, fruits/vegetables daily     Physical Exam: BP (!) 142/80   Pulse 82   Temp 98.2 F (36.8 C)   Ht 5\' 9"  (1.753 m)   Wt 219 lb (99.3 kg)   BMI 32.34 kg/m  Constitutional: generally well-appearing Oropharynx normal slightly red but no focal areas worse in any other Psychiatric: alert and oriented x3 Abdomen: soft, nontender, nondistended, no obvious ascites, no peritoneal signs, normal bowel sounds No peripheral edema noted in lower extremities  Assessment and plan: 63 y.o. male with history of Zenker's myotomy, now with dysphasia and throat pain  He had an endoscopic Zenker's myotomy about a year ago.  This clearly helped his swallowing but ever since then he has had some left-sided throat pain.  Dysphasia really turned about 3 months ago.  He has tried to see the doctor who did these endoscopic myotomy for him however he cannot get an appointment.  His oropharynx was normal just a little red diffusely.  I am concerned about recurrent Zenker's.  Possibly other oral pharyngeal or cricopharyngeal process.  This does not seem to be an esophageal issue.  I recommended we start the work-up with  a modified barium swallow evaluation as well as a barium esophagram.  He understands he might need further testing including an ENT evaluation, possibly an upper endoscopy.  Please see the "Patient Instructions" section for addition details about the plan.  Owens Loffler, MD [facility] 09/26/2018, 9:19 AM

## 2018-09-26 NOTE — Patient Instructions (Addendum)
Modified barium swallow study Barium esophagram.  Both for dysphagia, left sided throat pain, s/p endoscopic Zenker's myotomy.  You have been scheduled for a Barium Esophogram at [facility] (1st floor of the hospital) on 10/06/18 at 11am. Please arrive 15 minutes prior to your appointment for registration. Make certain not to have anything to eat or drink 3 hours prior to your test. If you need to reschedule for any reason, please contact radiology at [phone] to do so. __________________________________________________________________ A barium swallow is an examination that concentrates on views of the esophagus. This tends to be a double contrast exam (barium and two liquids which, when combined, create a gas to distend the wall of the oesophagus) or single contrast (non-ionic iodine based). The study is usually tailored to your symptoms so a good history is essential. Attention is paid during the study to the form, structure and configuration of the esophagus, looking for functional disorders (such as aspiration, dysphagia, achalasia, motility and reflux) EXAMINATION You may be asked to change into a gown, depending on the type of swallow being performed. A radiologist and radiographer will perform the procedure. The radiologist will advise you of the type of contrast selected for your procedure and direct you during the exam. You will be asked to stand, sit or lie in several different positions and to hold a small amount of fluid in your mouth before being asked to swallow while the imaging is performed .In some instances you may be asked to swallow barium coated marshmallows to assess the motility of a solid food bolus. The exam can be recorded as a digital or video fluoroscopy procedure. POST PROCEDURE It will take 1-2 days for the barium to pass through your system. To facilitate this, it is important, unless otherwise directed, to increase your fluids for the next 24-48hrs and to  resume your normal diet.  This test typically takes about 30 minutes to perform.  You have been scheduled for a modified barium swallow on     -at------------  Please arrive 15 minutes prior to your test for registration. You will go to     Radiology (1st Floor) for your appointment. Should you need to cancel or reschedule your appointment, please contact [phone] [facility] [place]) or [phone] [name]). _____________________________________________________________________ A Modified Barium Swallow Study, or MBS, is a special x-ray that is taken to check swallowing skills. It is carried out by a Stage manager and a Psychologist, clinical (SLP). During this test, yourmouth, throat, and esophagus, a muscular tube which connects your mouth to your stomach, is checked. The test will help you, your doctor, and the SLP plan what types of foods and liquids are easier for you to swallow. The SLP will also identify positions and ways to help you swallow more easily and safely. What will happen during an MBS? You will be taken to an x-ray room and seated comfortably. You will be asked to swallow small amounts of food and liquid mixed with barium. Barium is a liquid or paste that allows images of your mouth, throat and esophagus to be seen on x-ray. The x-ray captures moving images of the food you are swallowing as it travels from your mouth through your throat and into your esophagus. This test helps identify whether food or liquid is entering your lungs (aspiration). The test also shows which part of your mouth or throat lacks strength or coordination to move the food or liquid in the right direction. This test typically takes 30 minutes to  1 hour to complete.  Thank you for entrusting me with your care and choosing [facility].  Dr  Ardis Hughs  _______________________________________________________________________  __________________________________________________________________________________

## 2018-09-28 ENCOUNTER — Telehealth: Payer: Self-pay | Admitting: *Deleted

## 2018-09-28 ENCOUNTER — Encounter: Payer: Self-pay | Admitting: Family Medicine

## 2018-09-28 ENCOUNTER — Other Ambulatory Visit: Payer: Self-pay

## 2018-09-28 ENCOUNTER — Ambulatory Visit (INDEPENDENT_AMBULATORY_CARE_PROVIDER_SITE_OTHER): Payer: PPO | Admitting: Family Medicine

## 2018-09-28 ENCOUNTER — Ambulatory Visit: Payer: Self-pay

## 2018-09-28 VITALS — BP 118/68 | HR 73 | Temp 98.3°F | Ht 69.5 in | Wt 217.0 lb

## 2018-09-28 DIAGNOSIS — J441 Chronic obstructive pulmonary disease with (acute) exacerbation: Secondary | ICD-10-CM | POA: Diagnosis not present

## 2018-09-28 DIAGNOSIS — J449 Chronic obstructive pulmonary disease, unspecified: Secondary | ICD-10-CM

## 2018-09-28 MED ORDER — PREDNISONE 20 MG PO TABS: ORAL_TABLET | ORAL | 0 refills | Status: DC

## 2018-09-28 MED ORDER — LEVOFLOXACIN 500 MG PO TABS: 500.0000 mg | ORAL_TABLET | Freq: Every day | ORAL | 0 refills | Status: DC

## 2018-09-28 NOTE — Telephone Encounter (Signed)
Noted  

## 2018-09-28 NOTE — Telephone Encounter (Signed)
Patient is currently scheduled for barium esophagram on 10/06/2018 at [facility] for dysphagia, left sided throat pain, s/p zenker's myotomy. Due to recent COVID-19 pandemic, [place] has requested that all non-emergent cases be cancelled/rescheduled at this time. Please advise as to whether this procedure can be delayed at least 4 weeks.

## 2018-09-28 NOTE — Telephone Encounter (Signed)
Pt called stating that he has sore throat and cough.  He says his breathing is tight.  His sputum is white and frothy. He has a wheeze.  He denies chest pain.  He had a low grade fever last night of 99.4.  Today his temp is 98.  He has Hx of CAD and open heart surgery.  He has COPD. He states he has been treating these symptoms for 1 month.  His symptoms had improved but are now back. He has not traveled of had any known exposure to COVID-19. Appointment scheduled per protocol.  Care advice read to patient. Pt verbalized understanding of all instructions.  Reason for Disposition . [1] Known COPD or other severe lung disease (i.e., bronchiectasis, cystic fibrosis, lung surgery) AND [2] worsening symptoms (i.e., increased sputum purulence or amount, increased breathing difficulty  Answer Assessment - Initial Assessment Questions 1. ONSET: "When did the cough begin?"      1 month ago got better then over last 2 weeks has gotten worse 2. SEVERITY: "How bad is the cough today?"      Cough in morning 3. RESPIRATORY DISTRESS: "Describe your breathing."      tight 4. FEVER: "Do you have a fever?" If so, ask: "What is your temperature, how was it measured, and when did it start?"     Low grade 99.4 last night 98 today 5. SPUTUM: "Describe the color of your sputum" (clear, white, yellow, green)     White frothy 6. HEMOPTYSIS: "Are you coughing up any blood?" If so ask: "How much?" (flecks, streaks, tablespoons, etc.)     no 7. CARDIAC HISTORY: "Do you have any history of heart disease?" (e.g., heart attack, congestive heart failure)     CAD open heart 8. LUNG HISTORY: "Do you have any history of lung disease?"  (e.g., pulmonary embolus, asthma, emphysema)  COPD 9. PE RISK FACTORS: "Do you have a history of blood clots?" (or: recent major surgery, recent prolonged travel, bedridden)     No 10. OTHER SYMPTOMS: "Do you have any other symptoms?" (e.g., runny nose, wheezing, chest pain)      Sore throat,  wheezing 11. PREGNANCY: "Is there any chance you are pregnant?" "When was your last menstrual period?"       N/A 12. TRAVEL: "Have you traveled out of the country in the last month?" (e.g., travel history, exposures)       No  Protocols used: [place]

## 2018-09-28 NOTE — Patient Instructions (Addendum)
Call pulmonology to schedule an appointment (try again).  I think you have a COPD exacerbation - treat with repeat prednisone taper, and levaquin antibiotic course. Schedule albuterol inhaler or nebulizer three times a day for next 3 days then back to as needed. Let us know if fever >101, worsening productive cough or shortness of breath.

## 2018-09-28 NOTE — Progress Notes (Signed)
BP 118/68 (BP Location: Left Arm, Patient Position: Sitting, Cuff Size: Normal)   Pulse 73   Temp 98.3 F (36.8 C) (Oral)   Ht 5' 9.5" (1.765 m)   Wt 217 lb (98.4 kg)   SpO2 93%   BMI 31.59 kg/m    CC: cough, ST  Subjective:    Patient ID: Carl Lee, male    DOB: 28-Aug-1955, 63 y.o.   MRN: [id number]  HPI: Carl Lee is a 63 y.o. male presenting on 09/28/2018 for Cough (C/o cough, sore throat, fever- max 99.6, SOB and HA. Sxs started more than 1 mo. Was seen and tx with abx and prednisone. Seemed to have helped and cough is not as bad. )   6 wk history of cough, nasal congestion, HA, jaw pain and frontal headache. Treated initially for acute sinusitis 2/7 with prednisone burst and azithromycin. With ongoing symptoms, seen again in clinic 2/19 and treated for COPD exacerbation with albuterol neb, rpt prednisone taper and benzonatate perls. CXR at that time was normal. He did feel better with extended prednisone course. He did almost fully recover at that time.   Now presents with 2 wk h/o ST and erythema of throat, progressive dyspnea and mildly productive cough of frothy white mucous. Chest congestion/tightness, L sided HA. Tmax 99.6. PNdrainage  No chills, ear or tooth pain,  No sick contacts at home. Non smoker.  Has tried echinacea, zinc, mucinex DM as well.  No recent travel or known exposure to coronavirus.   Progressive dysphagia - seeing Dr Ardis Hughs planned barium swallow.   Known COPD on scheduled daily anoro and PRN albuterol neb/inhaler (2-3 times a week)  Last saw pulm ([facility]) 01/2018. Overdue for f/u. Having trouble getting through.  Ex smoker quit 2015.  Zenker diverticulum repaired 09/2017. OSA on CPAP at night.      Relevant past medical, surgical, family and social history reviewed and updated as indicated. Interim medical history since our last visit reviewed. Allergies and medications reviewed and updated. Outpatient Medications Prior to Visit   Medication Sig Dispense Refill  . acetaminophen (TYLENOL) 325 MG tablet Take 2 tablets (650 mg total) by mouth every 6 (six) hours as needed for moderate pain or headache.    . albuterol (PROAIR HFA) 108 (90 Base) MCG/ACT inhaler Inhale 2 puffs into the lungs every 4 (four) hours as needed for wheezing or shortness of breath. 8.5 Inhaler 2  . albuterol (PROVENTIL) (2.5 MG/3ML) 0.083% nebulizer solution Take 3 mLs (2.5 mg total) by nebulization every 6 (six) hours as needed for wheezing or shortness of breath. 75 mL 12  . amLODipine (NORVASC) 5 MG tablet TAKE 1 TABLET BY MOUTH EVERY DAY 90 tablet 3  . aspirin EC 325 MG EC tablet Take 1 tablet (325 mg total) by mouth daily.    [name] atorvastatin (LIPITOR) 20 MG tablet TAKE 1 TABLET (20 MG TOTAL) BY MOUTH DAILY AT 6 PM. 90 tablet 3  . famotidine (PEPCID) 20 MG tablet TAKE 1 TABLET (20 MG TOTAL) BY MOUTH 2 (TWO) TIMES DAILY. 180 tablet 1  . gabapentin (NEURONTIN) 300 MG capsule Take 1 capsule (300 mg total) by mouth at bedtime. First 3 days take once nightly 30 capsule 11  . glucose blood (ONE TOUCH ULTRA TEST) test strip CHECK ONCE DAILY AND AS NEEDED    . metoprolol tartrate (LOPRESSOR) 25 MG tablet Take 0.5 tablets (12.5 mg total) by mouth 2 (two) times daily. 90 tablet 3  . oxyCODONE-acetaminophen (PERCOCET/ROXICET)  5-325 MG tablet Take 1-2 tablets by mouth every 4 (four) hours as needed for severe pain. 30 tablet 0  . umeclidinium-vilanterol (ANORO ELLIPTA) 62.5-25 MCG/INH AEPB Inhale 1 puff into the lungs daily. 60 each 2  . vitamin B-12 (CYANOCOBALAMIN) 500 MCG tablet Take 1 tablet (500 mcg total) by mouth every other day.     No facility-administered medications prior to visit.      Per HPI unless specifically indicated in ROS section below Review of Systems Objective:    BP 118/68 (BP Location: Left Arm, Patient Position: Sitting, Cuff Size: Normal)   Pulse 73   Temp 98.3 F (36.8 C) (Oral)   Ht 5' 9.5" (1.765 m)   Wt 217 lb (98.4 kg)    SpO2 93%   BMI 31.59 kg/m   Wt Readings from Last 3 Encounters:  09/28/18 217 lb (98.4 kg)  09/26/18 219 lb (99.3 kg)  09/12/18 218 lb 1.9 oz (98.9 kg)    Physical Exam Vitals signs and nursing note reviewed.  Constitutional:      General: He is not in acute distress.    Appearance: Normal appearance. He is well-developed.  HENT:     Head: Normocephalic and atraumatic.     Right Ear: Hearing, tympanic membrane, ear canal and external ear normal.     Left Ear: Hearing, tympanic membrane, ear canal and external ear normal.     Nose: Congestion present. No mucosal edema or rhinorrhea.     Right Sinus: Frontal sinus tenderness present. No maxillary sinus tenderness.     Left Sinus: Frontal sinus tenderness present. No maxillary sinus tenderness.     Mouth/Throat:     Mouth: Mucous membranes are moist.     Pharynx: Uvula midline. Posterior oropharyngeal erythema present. No oropharyngeal exudate.     Tonsils: No tonsillar abscesses.  Eyes:     General: No scleral icterus.    Conjunctiva/sclera: Conjunctivae normal.     Pupils: Pupils are equal, round, and reactive to light.  Neck:     Musculoskeletal: Normal range of motion and neck supple.  Cardiovascular:     Rate and Rhythm: Normal rate and regular rhythm.     Pulses: Normal pulses.     Heart sounds: Normal heart sounds. No murmur.  Pulmonary:     Effort: Pulmonary effort is normal. No respiratory distress.     Breath sounds: Decreased air movement present. Decreased breath sounds (tight throughout) and rhonchi (faint scattered) present. No wheezing or rales.     Comments: Few crackles. Speaks in full sentences, no increased WOB Lymphadenopathy:     Cervical: No cervical adenopathy.  Skin:    General: Skin is warm and dry.     Findings: No rash.  Neurological:     Mental Status: He is alert.       Lab Results  Component Value Date   CREATININE 0.80 07/13/2018   BUN 25 (H) 07/13/2018   NA 139 07/13/2018   K 4.7  07/13/2018   CL 104 07/13/2018   CO2 29 07/13/2018    Assessment & Plan:   Problem List Items Addressed This Visit    COPD, moderate ([place])    Continue anoro daily, discussed albuterol use.  I did encourage he try and contact pulm clinic for f/u although he states he has tried several times in the past month without success. He will let me know if he continues having difficulty.       Relevant Medications   predniSONE (DELTASONE)  20 MG tablet   COPD exacerbation ([place]) - Primary    Anticipate recurrent COPD exacerbation after last month's treated with zpack. Will broaden coverage with levaquin 500mg  7d course, will prescribe prednisone 20mg  1 wk taper, recommend albuterol neb TID for 3 days then PRN. Update if not improving with treatment. Pt agrees with plan.       Relevant Medications   predniSONE (DELTASONE) 20 MG tablet       Meds ordered this encounter  Medications  . levofloxacin (LEVAQUIN) 500 MG tablet    Sig: Take 1 tablet (500 mg total) by mouth daily.    Dispense:  7 tablet    Refill:  0  . predniSONE (DELTASONE) 20 MG tablet    Sig: Take two tablets daily for 3 days followed by one tablet daily for 4 days    Dispense:  10 tablet    Refill:  0   No orders of the defined types were placed in this encounter.   Follow up plan: No follow-ups on file.  [name], MD

## 2018-09-28 NOTE — Assessment & Plan Note (Addendum)
Continue anoro daily, discussed albuterol use.  I did encourage he try and contact pulm clinic for f/u although he states he has tried several times in the past month without success. He will let me know if he continues having difficulty.

## 2018-09-28 NOTE — Assessment & Plan Note (Signed)
Anticipate recurrent COPD exacerbation after last month's treated with zpack. Will broaden coverage with levaquin 500mg  7d course, will prescribe prednisone 20mg  1 wk taper, recommend albuterol neb TID for 3 days then PRN. Update if not improving with treatment. Pt agrees with plan.

## 2018-10-02 NOTE — Telephone Encounter (Signed)
Patient has been rescheduled to 11/02/18 and he states he is aware of this.

## 2018-10-02 NOTE — Telephone Encounter (Addendum)
I think it is safe to delay this for 4 weeks.

## 2018-10-06 ENCOUNTER — Ambulatory Visit (HOSPITAL_COMMUNITY): Payer: PPO

## 2018-10-10 ENCOUNTER — Encounter: Payer: Self-pay | Admitting: Family Medicine

## 2018-10-10 ENCOUNTER — Other Ambulatory Visit: Payer: Self-pay

## 2018-10-10 ENCOUNTER — Ambulatory Visit (INDEPENDENT_AMBULATORY_CARE_PROVIDER_SITE_OTHER): Payer: PPO | Admitting: Family Medicine

## 2018-10-10 VITALS — BP 130/83 | HR 91 | Temp 97.6°F | Ht 69.5 in | Wt 217.0 lb

## 2018-10-10 DIAGNOSIS — R05 Cough: Secondary | ICD-10-CM | POA: Insufficient documentation

## 2018-10-10 DIAGNOSIS — R059 Cough, unspecified: Secondary | ICD-10-CM

## 2018-10-10 DIAGNOSIS — Z951 Presence of aortocoronary bypass graft: Secondary | ICD-10-CM | POA: Diagnosis not present

## 2018-10-10 DIAGNOSIS — I25810 Atherosclerosis of coronary artery bypass graft(s) without angina pectoris: Secondary | ICD-10-CM

## 2018-10-10 DIAGNOSIS — J449 Chronic obstructive pulmonary disease, unspecified: Secondary | ICD-10-CM

## 2018-10-10 DIAGNOSIS — J029 Acute pharyngitis, unspecified: Secondary | ICD-10-CM | POA: Diagnosis not present

## 2018-10-10 DIAGNOSIS — J441 Chronic obstructive pulmonary disease with (acute) exacerbation: Secondary | ICD-10-CM

## 2018-10-10 DIAGNOSIS — K225 Diverticulum of esophagus, acquired: Secondary | ICD-10-CM | POA: Diagnosis not present

## 2018-10-10 DIAGNOSIS — E118 Type 2 diabetes mellitus with unspecified complications: Secondary | ICD-10-CM

## 2018-10-10 MED ORDER — HYDROCODONE-HOMATROPINE 5-1.5 MG/5ML PO SYRP: 5.0000 mL | ORAL_SOLUTION | Freq: Three times a day (TID) | ORAL | 0 refills | Status: DC | PRN

## 2018-10-10 MED ORDER — AMOXICILLIN 500 MG PO CAPS: 500.0000 mg | ORAL_CAPSULE | Freq: Three times a day (TID) | ORAL | 0 refills | Status: DC

## 2018-10-10 MED ORDER — PREDNISONE 10 MG PO TABS: ORAL_TABLET | ORAL | 0 refills | Status: DC

## 2018-10-10 NOTE — Assessment & Plan Note (Signed)
Will re evaluate after prednisone taper.

## 2018-10-10 NOTE — Assessment & Plan Note (Addendum)
Anticipate large portion due to allergic rhinitis flare given symptoms and temporal relation to mowing lawn. Discussed this. He states no improvement with flonase, and he was advised against using antihistamine by cardiology ? (anticipate likely more avoidance of decongestant). Also anticipate COPD contributing to symptoms.  Will Rx prednisone course. See above.

## 2018-10-10 NOTE — Progress Notes (Signed)
Virtual visit attempted through WebEx. After 10 minutes of troubleshooting, able to complete visit.  Patient location: home. Wife was also in the room.  Provider location: [place] at [facility], office If any vitals were documented below, they were collected by patient at home.    BP 130/83 (BP Location: Left Arm, Patient Position: Sitting, Cuff Size: Normal)   Pulse 91   Temp 97.6 F (36.4 C) (Oral)   Ht 5' 9.5" (1.765 m)   Wt 217 lb (98.4 kg)   BMI 31.59 kg/m    CC: cough Subjective:    Patient ID: Carl Lee, male    DOB: [birth year], 63 y.o.   MRN: [id number]  HPI: Carl Lee is a 63 y.o. male presenting on 10/10/2018 for Cough (C/o cough, fever- max 100.9 and sore throat. Tried Tylenol and gargling with Listerine. )   See prior note for details. Off and on illness over last 2 months, treated with prednisone/azithromycin then rpt prednisone taper 08/2018, then COPD exacerbation treated with levaquin and rpt prednisone taper 09/2018 (10 days ago). At that time I did recommend f/u with pulmonology - difficulty getting in at this time.   A few days after finishing prior regimen, he started feeling worse. 4d h/o persistent cough, sneezing, hoarse voice, HA, rhinorrhea. Itchy watery eyes. Tmax 100.6. this morning 99.8. Notes R > L throat soreness and mild swelling. This may have started after mowing yard. Some dyspnea with exertion. No white film on tongue or in mouth. Some diarrhea. Thinks he sees white spots on left side of throat.   Treating with tylenol. Has been using listerine for throat.  Wife sick with sinus infection as well.   Known COPD on scheduled daily anoro and PRN albuterol neb/inhaler (2-3 times a week)  Last saw pulm ([facility]) 01/2018. Overdue for f/u. Having trouble getting through.  Ex smoker quit 2015.  OSA on CPAP at night.   Progressive dysphagia - seeing Dr Ardis Hughs planned barium swallow postponed due to [place] concerns. Zenker diverticulum repaired  09/2017.     Relevant past medical, surgical, family and social history reviewed and updated as indicated. Interim medical history since our last visit reviewed. Allergies and medications reviewed and updated. Outpatient Medications Prior to Visit  Medication Sig Dispense Refill  . acetaminophen (TYLENOL) 325 MG tablet Take 2 tablets (650 mg total) by mouth every 6 (six) hours as needed for moderate pain or headache.    . albuterol (PROAIR HFA) 108 (90 Base) MCG/ACT inhaler Inhale 2 puffs into the lungs every 4 (four) hours as needed for wheezing or shortness of breath. 8.5 Inhaler 2  . albuterol (PROVENTIL) (2.5 MG/3ML) 0.083% nebulizer solution Take 3 mLs (2.5 mg total) by nebulization every 6 (six) hours as needed for wheezing or shortness of breath. 75 mL 12  . amLODipine (NORVASC) 5 MG tablet TAKE 1 TABLET BY MOUTH EVERY DAY 90 tablet 3  . aspirin EC 325 MG EC tablet Take 1 tablet (325 mg total) by mouth daily.    [name] atorvastatin (LIPITOR) 20 MG tablet TAKE 1 TABLET (20 MG TOTAL) BY MOUTH DAILY AT 6 PM. 90 tablet 3  . famotidine (PEPCID) 20 MG tablet TAKE 1 TABLET (20 MG TOTAL) BY MOUTH 2 (TWO) TIMES DAILY. 180 tablet 1  . gabapentin (NEURONTIN) 300 MG capsule Take 1 capsule (300 mg total) by mouth at bedtime. First 3 days take once nightly 30 capsule 11  . glucose blood (ONE TOUCH ULTRA TEST) test strip CHECK  ONCE DAILY AND AS NEEDED    . metoprolol tartrate (LOPRESSOR) 25 MG tablet Take 0.5 tablets (12.5 mg total) by mouth 2 (two) times daily. 90 tablet 3  . oxyCODONE-acetaminophen (PERCOCET/ROXICET) 5-325 MG tablet Take 1-2 tablets by mouth every 4 (four) hours as needed for severe pain. 30 tablet 0  . tamsulosin (FLOMAX) 0.4 MG CAPS capsule Take 0.4 mg by mouth 2 (two) times daily.    [name] umeclidinium-vilanterol (ANORO ELLIPTA) 62.5-25 MCG/INH AEPB Inhale 1 puff into the lungs daily. 60 each 2  . vitamin B-12 (CYANOCOBALAMIN) 500 MCG tablet Take 1 tablet (500 mcg total) by mouth every other  day.    . levofloxacin (LEVAQUIN) 500 MG tablet Take 1 tablet (500 mg total) by mouth daily. 7 tablet 0  . predniSONE (DELTASONE) 20 MG tablet Take two tablets daily for 3 days followed by one tablet daily for 4 days 10 tablet 0   No facility-administered medications prior to visit.      Per HPI unless specifically indicated in ROS section below Review of Systems Objective:    BP 130/83 (BP Location: Left Arm, Patient Position: Sitting, Cuff Size: Normal)   Pulse 91   Temp 97.6 F (36.4 C) (Oral)   Ht 5' 9.5" (1.765 m)   Wt 217 lb (98.4 kg)   BMI 31.59 kg/m   Wt Readings from Last 3 Encounters:  10/10/18 217 lb (98.4 kg)  09/28/18 217 lb (98.4 kg)  09/26/18 219 lb (99.3 kg)    Physical Exam Vitals signs and nursing note reviewed.  Constitutional:      General: He is not in acute distress.    Comments: Evidently congested. Hoarse sounding.  Pulmonary:     Comments: Speaks in complete sentences. No evident increased work of breathing.  Neurological:     Mental Status: He is alert.       Lab Results  Component Value Date   HGBA1C 6.5 07/13/2018   Assessment & Plan:   Problem List Items Addressed This Visit    Zenker diverticulum   Sore throat - Primary    R>L sore throat as well as pt endorsed erythema and possible white spots in back of throat. Will cover for strep throat with amoxicillin 500mg  TID 10d course. Red flags to seek further care reviewed.       S/P CABG x 3   Cough    Anticipate large portion due to allergic rhinitis flare given symptoms and temporal relation to mowing lawn. Discussed this. He states no improvement with flonase, and he was advised against using antihistamine by cardiology ? (anticipate likely more avoidance of decongestant). Also anticipate COPD contributing to symptoms.  Will Rx prednisone course. See above.       Coronary artery disease   COPD, moderate ([facility])   Relevant Medications   predniSONE (DELTASONE) 10 MG tablet    HYDROcodone-homatropine (HYCODAN) 5-1.5 MG/5ML syrup   COPD exacerbation ([facility])    Will re evaluate after prednisone taper.       Relevant Medications   predniSONE (DELTASONE) 10 MG tablet   HYDROcodone-homatropine (HYCODAN) 5-1.5 MG/5ML syrup   Controlled diabetes mellitus type 2 with complications ([place])    Reviewed caution with steroid use.           Meds ordered this encounter  Medications  . predniSONE (DELTASONE) 10 MG tablet    Sig: Take three tablets for 3 days followed by two tablets for 3 days followed by one tablet for 3 days  Dispense:  18 tablet    Refill:  0  . amoxicillin (AMOXIL) 500 MG capsule    Sig: Take 1 capsule (500 mg total) by mouth 3 (three) times daily.    Dispense:  30 capsule    Refill:  0  . HYDROcodone-homatropine (HYCODAN) 5-1.5 MG/5ML syrup    Sig: Take 5 mLs by mouth 3 (three) times daily as needed for cough (sedation precautions).    Dispense:  120 mL    Refill:  0   No orders of the defined types were placed in this encounter.   Follow up plan: No follow-ups on file.  [name], MD

## 2018-10-10 NOTE — Assessment & Plan Note (Signed)
Reviewed caution with steroid use.

## 2018-10-10 NOTE — Assessment & Plan Note (Signed)
R>L sore throat as well as pt endorsed erythema and possible white spots in back of throat. Will cover for strep throat with amoxicillin 500mg  TID 10d course. Red flags to seek further care reviewed.

## 2018-10-30 LAB — HM DIABETES EYE EXAM

## 2018-11-02 ENCOUNTER — Ambulatory Visit (HOSPITAL_COMMUNITY): Payer: PPO

## 2018-11-07 ENCOUNTER — Telehealth: Payer: Self-pay | Admitting: Family Medicine

## 2018-11-07 DIAGNOSIS — E118 Type 2 diabetes mellitus with unspecified complications: Secondary | ICD-10-CM

## 2018-11-07 NOTE — Telephone Encounter (Signed)
Schedule pt lab visit for 11/09/18 @ 8:35am.

## 2018-11-07 NOTE — Telephone Encounter (Signed)
Yes plz schedule lab visit prior to appt  I have ordered labs (A1c). Not time for urine microalbumin yet - plz triage bladder symptoms to see what we need to do for this.

## 2018-11-07 NOTE — Telephone Encounter (Signed)
Pt is scheduled for appt on 11/13/18 called to make virtual but he said he needs his blood/sugar checked he said was wondering if it will be viable since he has been on a lot of prednisone and steroids? He stated he is having problem with his bladder in which it also time for a urine protein check. Does a lab visit need to be scheduled before his appointment on 11/13/18?

## 2018-11-08 ENCOUNTER — Ambulatory Visit (INDEPENDENT_AMBULATORY_CARE_PROVIDER_SITE_OTHER): Payer: PPO | Admitting: Family Medicine

## 2018-11-08 ENCOUNTER — Other Ambulatory Visit: Payer: Self-pay

## 2018-11-08 ENCOUNTER — Encounter: Payer: Self-pay | Admitting: Family Medicine

## 2018-11-08 ENCOUNTER — Other Ambulatory Visit (INDEPENDENT_AMBULATORY_CARE_PROVIDER_SITE_OTHER): Payer: PPO

## 2018-11-08 VITALS — BP 120/87 | HR 82 | Temp 97.9°F | Ht 69.5 in | Wt 215.0 lb

## 2018-11-08 DIAGNOSIS — Z8551 Personal history of malignant neoplasm of bladder: Secondary | ICD-10-CM

## 2018-11-08 DIAGNOSIS — R300 Dysuria: Secondary | ICD-10-CM | POA: Diagnosis not present

## 2018-11-08 DIAGNOSIS — J449 Chronic obstructive pulmonary disease, unspecified: Secondary | ICD-10-CM

## 2018-11-08 DIAGNOSIS — R350 Frequency of micturition: Secondary | ICD-10-CM | POA: Diagnosis not present

## 2018-11-08 DIAGNOSIS — E118 Type 2 diabetes mellitus with unspecified complications: Secondary | ICD-10-CM

## 2018-11-08 DIAGNOSIS — I10 Essential (primary) hypertension: Secondary | ICD-10-CM | POA: Diagnosis not present

## 2018-11-08 LAB — POC URINALSYSI DIPSTICK (AUTOMATED)
Bilirubin, UA: NEGATIVE
Blood, UA: NEGATIVE
Glucose, UA: NEGATIVE
Ketones, UA: NEGATIVE
Leukocytes, UA: NEGATIVE
Nitrite, UA: NEGATIVE
Protein, UA: NEGATIVE
Spec Grav, UA: 1.015 (ref 1.010–1.025)
Urobilinogen, UA: 0.2 E.U./dL
pH, UA: 6 (ref 5.0–8.0)

## 2018-11-08 LAB — POCT GLYCOSYLATED HEMOGLOBIN (HGB A1C): Hemoglobin A1C: 6.5 % — AB (ref 4.0–5.6)

## 2018-11-08 NOTE — Telephone Encounter (Addendum)
Spoke with pt asking about bladder issues.  Says he having to use bathroom often and painful, burning urination. Also, Sxs started about 1 mo ago.   Scheduled virtual visit today at 12:00.  Will bring urine sample this morning, lab schedule at 9:50 AM.

## 2018-11-08 NOTE — Assessment & Plan Note (Addendum)
Good control based on A1c - will review at upcoming appt on Monday.

## 2018-11-08 NOTE — Assessment & Plan Note (Signed)
If ongoing symptoms, suggested return sooner to urology.

## 2018-11-08 NOTE — Assessment & Plan Note (Signed)
Ongoing trouble with dyspnea notes season change is a trigger. Discussed respiratory medication use, encouraged pulm f/u.

## 2018-11-08 NOTE — Progress Notes (Signed)
Virtual visit completed through [employer]. Due to national recommendations of social distancing due to [place] 19, a virtual visit is felt to be most appropriate for this patient at this time.   Patient location: home Provider location: [place] at [facility], office If any vitals were documented, they were collected by patient at home unless specified below.    BP 120/87   Pulse 82   Temp 97.9 F (36.6 C) (Oral)   Ht 5' 9.5" (1.765 m)   Wt 215 lb (97.5 kg)   SpO2 94% Comment: RA - better at rest  BMI 31.29 kg/m    CC: urinary symptoms  Subjective:    Patient ID: Carl Lee, Carl Lee    DOB: [birth year], 63 y.o.   MRN: [id number]  HPI: Carl Lee is a 63 y.o. Carl Lee presenting on 11/08/2018 for Dysuria (C/o pain and burning with urination, urinary frequency and urgency. Sometimes has strong urge to urinate but voids very little.  Sxs started about 1 mo ago. )   1 mo h/o increased urinary frequency for the first few hours in the mornings associated with some dysuria. Urgency with incomplete emptying. Some lower abdominal pain/discomfort at penis. Some ongoing lower bilat flank pain and lower back pain. Notes darker urine with odor.  No rectal pain, no nocturia. No urethral discharge or penile rash.  Drinks 1 cup coffee in the morning.  Stopped flomax BID 3 weeks ago, restarted 1 wk ago without significant benefit. Doesn't like how he feels while on this.   Known h/o bladder cancer (sees urology yearly). Normal cystoscopy 11/2017 NED, with complex UTI afterwards - grew Burkholderia cepacia complex UTI treated with levaquin. Latest cystoscopy 05/2018 - NED.   BP elevated today despite amlodipine 5mg  daily, metoprolol 12.5mg  bid. Improved on retesting.   Recently treated for sore throat with amox course as well as prednisone course for ongoing cough. Still having ongoing dyspnea worse today - after he worked out in the yard yesterday.   Noticing L frontal aching pressure in late afternoons  despite daily claritin. No vision changes.      Relevant past medical, surgical, family and social history reviewed and updated as indicated. Interim medical history since our last visit reviewed. Allergies and medications reviewed and updated. Outpatient Medications Prior to Visit  Medication Sig Dispense Refill  . acetaminophen (TYLENOL) 325 MG tablet Take 2 tablets (650 mg total) by mouth every 6 (six) hours as needed for moderate pain or headache.    . albuterol (PROAIR HFA) 108 (90 Base) MCG/ACT inhaler Inhale 2 puffs into the lungs every 4 (four) hours as needed for wheezing or shortness of breath. 8.5 Inhaler 2  . albuterol (PROVENTIL) (2.5 MG/3ML) 0.083% nebulizer solution Take 3 mLs (2.5 mg total) by nebulization every 6 (six) hours as needed for wheezing or shortness of breath. 75 mL 12  . amLODipine (NORVASC) 5 MG tablet TAKE 1 TABLET BY MOUTH EVERY DAY 90 tablet 3  . aspirin EC 325 MG EC tablet Take 1 tablet (325 mg total) by mouth daily.    [name] atorvastatin (LIPITOR) 20 MG tablet TAKE 1 TABLET (20 MG TOTAL) BY MOUTH DAILY AT 6 PM. 90 tablet 3  . famotidine (PEPCID) 20 MG tablet TAKE 1 TABLET (20 MG TOTAL) BY MOUTH 2 (TWO) TIMES DAILY. 180 tablet 1  . gabapentin (NEURONTIN) 300 MG capsule Take 1 capsule (300 mg total) by mouth at bedtime. First 3 days take once nightly 30 capsule 11  . glucose  blood (ONE TOUCH ULTRA TEST) test strip CHECK ONCE DAILY AND AS NEEDED    . metoprolol tartrate (LOPRESSOR) 25 MG tablet Take 0.5 tablets (12.5 mg total) by mouth 2 (two) times daily. 90 tablet 3  . oxyCODONE-acetaminophen (PERCOCET/ROXICET) 5-325 MG tablet Take 1-2 tablets by mouth every 4 (four) hours as needed for severe pain. 30 tablet 0  . tamsulosin (FLOMAX) 0.4 MG CAPS capsule Take 1 capsule (0.4 mg total) by mouth daily after supper.    . umeclidinium-vilanterol (ANORO ELLIPTA) 62.5-25 MCG/INH AEPB Inhale 1 puff into the lungs daily. 60 each 2  . vitamin B-12 (CYANOCOBALAMIN) 500 MCG  tablet Take 1 tablet (500 mcg total) by mouth every other day.    . tamsulosin (FLOMAX) 0.4 MG CAPS capsule Take 0.4 mg by mouth 2 (two) times daily.    [name] amoxicillin (AMOXIL) 500 MG capsule Take 1 capsule (500 mg total) by mouth 3 (three) times daily. 30 capsule 0  . HYDROcodone-homatropine (HYCODAN) 5-1.5 MG/5ML syrup Take 5 mLs by mouth 3 (three) times daily as needed for cough (sedation precautions). 120 mL 0  . predniSONE (DELTASONE) 10 MG tablet Take three tablets for 3 days followed by two tablets for 3 days followed by one tablet for 3 days 18 tablet 0   No facility-administered medications prior to visit.      Per HPI unless specifically indicated in ROS section below Review of Systems Objective:    BP 120/87   Pulse 82   Temp 97.9 F (36.6 C) (Oral)   Ht 5' 9.5" (1.765 m)   Wt 215 lb (97.5 kg)   SpO2 94% Comment: RA - better at rest  BMI 31.29 kg/m   Wt Readings from Last 3 Encounters:  11/08/18 215 lb (97.5 kg)  10/10/18 217 lb (98.4 kg)  09/28/18 217 lb (98.4 kg)     Physical exam: Gen: alert, NAD, not ill appearing Pulm: speaks in complete sentences without increased work of breathing Psych: normal mood, normal thought content      Results for orders placed or performed in visit on 11/08/18  POCT Urinalysis Dipstick (Automated)  Result Value Ref Range   Color, UA yellow    Clarity, UA clear    Glucose, UA Negative Negative   Bilirubin, UA negative    Ketones, UA negative    Spec Grav, UA 1.015 1.010 - 1.025   Blood, UA negative    pH, UA 6.0 5.0 - 8.0   Protein, UA Negative Negative   Urobilinogen, UA 0.2 0.2 or 1.0 E.U./dL   Nitrite, UA negative    Leukocytes, UA Negative Negative   Lab Results  Component Value Date   HGBA1C 6.5 (A) 11/08/2018    Assessment & Plan:   Problem List Items Addressed This Visit    Urinary frequency - Primary    Ongoing LUTS off and on over the past month, but UA today normal. Will send UCx to further evaluate.  ?chronic prostatitis. Recommend restart flomax at once daily (bid dosing not tolerated well), if ongoing symptoms offered in-office eval for DRE vs return to urology.       Relevant Orders   Urine Culture   Hypertension    Chronic, stable. Continue current regimen. BP improved on recheck.       History of bladder cancer    If ongoing symptoms, suggested return sooner to urology.       COPD, moderate ([place])    Ongoing trouble with dyspnea notes season change is  a trigger. Discussed respiratory medication use, encouraged pulm f/u.       Controlled diabetes mellitus type 2 with complications ([place])    Good control based on A1c - will review at upcoming appt on Monday.        Other Visit Diagnoses    Dysuria       Relevant Orders   POCT Urinalysis Dipstick (Automated) (Completed)   Urine Culture       No orders of the defined types were placed in this encounter.  Orders Placed This Encounter  Procedures  . Urine Culture  . POCT Urinalysis Dipstick (Automated)    Follow up plan: No follow-ups on file.  [name], MD

## 2018-11-08 NOTE — Assessment & Plan Note (Signed)
Chronic, stable. Continue current regimen. BP improved on recheck.

## 2018-11-08 NOTE — Assessment & Plan Note (Signed)
Ongoing LUTS off and on over the past month, but UA today normal. Will send UCx to further evaluate. ?chronic prostatitis. Recommend restart flomax at once daily (bid dosing not tolerated well), if ongoing symptoms offered in-office eval for DRE vs return to urology.

## 2018-11-09 ENCOUNTER — Telehealth: Payer: Self-pay | Admitting: Family Medicine

## 2018-11-09 ENCOUNTER — Other Ambulatory Visit: Payer: PPO

## 2018-11-09 DIAGNOSIS — J449 Chronic obstructive pulmonary disease, unspecified: Secondary | ICD-10-CM | POA: Diagnosis not present

## 2018-11-09 DIAGNOSIS — R269 Unspecified abnormalities of gait and mobility: Secondary | ICD-10-CM | POA: Diagnosis not present

## 2018-11-09 DIAGNOSIS — G4733 Obstructive sleep apnea (adult) (pediatric): Secondary | ICD-10-CM | POA: Diagnosis not present

## 2018-11-09 LAB — URINE CULTURE
MICRO NUMBER:: 432795
Result:: NO GROWTH
SPECIMEN QUALITY:: ADEQUATE

## 2018-11-09 NOTE — Telephone Encounter (Signed)
Called and spoke with pt and pt is aware that it will be a virtual visit.

## 2018-11-09 NOTE — Telephone Encounter (Signed)
Best number [phone] Pt called wanted to know if he still needs appointment on Monday if so virtual or in office

## 2018-11-10 ENCOUNTER — Telehealth: Payer: Self-pay | Admitting: Pulmonary Disease

## 2018-11-10 ENCOUNTER — Encounter: Payer: Self-pay | Admitting: Nurse Practitioner

## 2018-11-10 ENCOUNTER — Other Ambulatory Visit: Payer: Self-pay

## 2018-11-10 ENCOUNTER — Ambulatory Visit (INDEPENDENT_AMBULATORY_CARE_PROVIDER_SITE_OTHER): Payer: PPO

## 2018-11-10 ENCOUNTER — Ambulatory Visit: Payer: PPO | Admitting: Nurse Practitioner

## 2018-11-10 VITALS — BP 118/82 | HR 83 | Temp 98.3°F | Ht 68.5 in | Wt 216.0 lb

## 2018-11-10 DIAGNOSIS — J449 Chronic obstructive pulmonary disease, unspecified: Secondary | ICD-10-CM

## 2018-11-10 DIAGNOSIS — R0602 Shortness of breath: Secondary | ICD-10-CM

## 2018-11-10 MED ORDER — FLUTICASONE-UMECLIDIN-VILANT 100-62.5-25 MCG/INH IN AEPB: 1.0000 | INHALATION_SPRAY | Freq: Every day | RESPIRATORY_TRACT | 0 refills | Status: DC

## 2018-11-10 MED ORDER — AZITHROMYCIN 250 MG PO TABS: ORAL_TABLET | ORAL | 0 refills | Status: DC

## 2018-11-10 MED ORDER — IPRATROPIUM-ALBUTEROL 0.5-2.5 (3) MG/3ML IN SOLN: 3.0000 mL | Freq: Four times a day (QID) | RESPIRATORY_TRACT | 0 refills | Status: DC | PRN

## 2018-11-10 NOTE — Progress Notes (Signed)
@Patient  ID: Carl Lee, male    DOB: 1956/03/04, 63 y.o.   MRN: [id number]  Chief Complaint  Patient presents with  . Acute Visit    Increased SOB over the past 4 months. SOB is worse in the mornings. Has noticed that his BP runs high with these episodes. Coughs more in the mornings.     Referring provider: [name], MD  HPI  63 year old male former smoker with COPD who is followed by Dr. Lake Lee.  Synopsis: [name] had a CABG in 2015 and pulmonary critical care medicine was consulted for a COPD exacerbation. July 2015 lung function testing showed a ratio 62%, FEV1 of 2.10 L (56% predicted), total lung capacity 6.35 L (80% predicted). He smoked for many years and quit smoking in 2015.  He had a Zenker dierticulum that would cause a lot of trouble, this was repaired 09/2017.    Tests: PFT July 2015 lung function testing showed a ratio 62%, FEV1 of 2.10 L (56% predicted), total lung capacity 6.35 L (80% predicted).  April 2017 pulmonary function testing: FEV1 1.8 L, total lung capacity 99% predicted, DLCO 39% predicted  Echo 11/12/2015 TTE > normal LVEF, RV mildly dilated and systolic function mildly reduced  OV 11/10/18 - acute shortness of breath Patient presents today for an acute visit.  He states that he has been having increased shortness of breath since the beginning of the year.  He has had a couple rounds of antibiotics in January and February and March for COPD exacerbations.  He also had a couple of prednisone tapers.  He states that his prednisone did make him feel better but once he was finished his symptoms returned.  He was seen by his PCP for these episodes.  He states that he is staying active and does do significant amount of work around his home.  He does feel like he has to take breaks more frequently due to his increased shortness of breath with exertion.  Denies any recent lower extremity edema.  Does follow with cardiology.  He has been using his albuterol  nebulizer treatments but states that he does not feel that it is helping.  He does use Anoro as directed but states it is not helping.  Denies any significant cough.  He denies any recent fever.  States that he does have pain on his right side with deep inspiration. Denies f/c/s, n/v/d, hemoptysis, PND, leg swelling.    Allergies  Allergen Reactions  . Codeine Other (See Comments)    Nightmares  . Doxycycline Hives and Other (See Comments)    mild    Immunization History  Administered Date(s) Administered  . Influenza,inj,Quad PF,6+ Mos 04/17/2013, 06/17/2014, 05/01/2015, 03/25/2016, 04/01/2017, 07/14/2018  . Pneumococcal Conjugate-13 05/01/2015  . Pneumococcal Polysaccharide-23 06/17/2014  . Tdap 12/14/2012  . Zoster 12/23/2015    Past Medical History:  Diagnosis Date  . AAA (abdominal aortic aneurysm) ([place]) 03/25/12   s/p endovascular repair  . Adenomatous polyp 06/2009  . Atrial tachycardia, paroxysmal ([place]) 06/18/2016  . Bladder cancer ([place]) 08/16/2008   recurrence 2015 and 2018 treated with office fulguration  . CAD (coronary artery disease), native coronary artery    S/p 3v CABG 01/2014   . Cataract 2019   bilateral eyes  . Childhood asthma    as a child  . Complication of anesthesia    "takes me a year to get over"  . Contact dermatitis    atypical [name])  . Controlled diabetes mellitus type  2 with complications ([place]) 67/20/9470   CAD   . COPD (chronic obstructive pulmonary disease) ([place])   . Coronary artery disease    a.2015: CABG w/ LIMA-LAD, RIMA-OM, SVG-RCA b. Cath on 07/10/15 w/ patent LIMA-LAD and RIMA-OM. SVG-RCA occluded but collaterals present  . Diverticulosis 2013   severe by CT and colonoscopy  . Environmental allergies    improved as ages  . Fracture of lateral malleolus of left ankle 08/29/2013  . Headache    hasn't had migraines in years  . Hepatic steatosis 06/2015   by [place]  . Hx of migraines   . Hypertension   . Lumbosacral radiculopathy  at S1 03/2012   left, with spinal stenosis (MRI 04/2013) improved with TF ESI L5/S1 and S1/2 ([place])  . OSA (obstructive sleep apnea) 09/04/2014   Severe with AHI 35/hr, uses BIPAP  . Overweight (BMI 25.0-29.9) 01/26/2017  . Pre-diabetes   . Prediabetes 06/08/2014   CAD   . Resistance to clopidogrel 2015   drug metabolism panel run - asked to scan  . Spinal stenosis    LS1 nerve root impingement from bulging disc  . Vitamin B12 deficiency   . Zenker diverticulum     Tobacco History: Social History   Tobacco Use  Smoking Status Former Smoker  . Packs/day: 1.00  . Years: 40.00  . Pack years: 40.00  . Types: Cigarettes  . Last attempt to quit: 02/05/2014  . Years since quitting: 4.7  Smokeless Tobacco Never Used   Counseling given: Not Answered   Outpatient Encounter Medications as of 11/10/2018  Medication Sig  . acetaminophen (TYLENOL) 325 MG tablet Take 2 tablets (650 mg total) by mouth every 6 (six) hours as needed for moderate pain or headache.  . albuterol (PROAIR HFA) 108 (90 Base) MCG/ACT inhaler Inhale 2 puffs into the lungs every 4 (four) hours as needed for wheezing or shortness of breath.  [name] albuterol (PROVENTIL) (2.5 MG/3ML) 0.083% nebulizer solution Take 3 mLs (2.5 mg total) by nebulization every 6 (six) hours as needed for wheezing or shortness of breath.  [name] amLODipine (NORVASC) 5 MG tablet TAKE 1 TABLET BY MOUTH EVERY DAY  . aspirin EC 325 MG EC tablet Take 1 tablet (325 mg total) by mouth daily.  [name] atorvastatin (LIPITOR) 20 MG tablet TAKE 1 TABLET (20 MG TOTAL) BY MOUTH DAILY AT 6 PM.  . famotidine (PEPCID) 20 MG tablet TAKE 1 TABLET (20 MG TOTAL) BY MOUTH 2 (TWO) TIMES DAILY.  [name] gabapentin (NEURONTIN) 300 MG capsule Take 1 capsule (300 mg total) by mouth at bedtime. First 3 days take once nightly  . glucose blood (ONE TOUCH ULTRA TEST) test strip CHECK ONCE DAILY AND AS NEEDED  . metoprolol tartrate (LOPRESSOR) 25 MG tablet Take 0.5 tablets (12.5 mg total)  by mouth 2 (two) times daily.  [name] oxyCODONE-acetaminophen (PERCOCET/ROXICET) 5-325 MG tablet Take 1-2 tablets by mouth every 4 (four) hours as needed for severe pain.  . tamsulosin (FLOMAX) 0.4 MG CAPS capsule Take 1 capsule (0.4 mg total) by mouth daily after supper.  . umeclidinium-vilanterol (ANORO ELLIPTA) 62.5-25 MCG/INH AEPB Inhale 1 puff into the lungs daily.  . vitamin B-12 (CYANOCOBALAMIN) 500 MCG tablet Take 1 tablet (500 mcg total) by mouth every other day.  [name] azithromycin (ZITHROMAX) 250 MG tablet Take 2 tablets (500 mg) on day 1, then take 1 tablet (250 mg) on days 2-5  . Fluticasone-Umeclidin-Vilant (TRELEGY ELLIPTA) 100-62.5-25 MCG/INH AEPB Inhale 1 puff into the lungs daily.  [name]  ipratropium-albuterol (DUONEB) 0.5-2.5 (3) MG/3ML SOLN Take 3 mLs by nebulization every 6 (six) hours as needed for up to 30 days.   No facility-administered encounter medications on file as of 11/10/2018.      Review of Systems  Review of Systems  Constitutional: Negative.  Negative for chills, fever and unexpected weight change.  HENT: Negative.   Respiratory: Positive for cough and shortness of breath. Negative for wheezing.   Cardiovascular: Negative.  Negative for chest pain, palpitations and leg swelling.  Gastrointestinal: Negative.   Allergic/Immunologic: Negative.   Neurological: Negative.   Psychiatric/Behavioral: Negative.        Physical Exam  BP 118/82   Pulse 83   Temp 98.3 F (36.8 C) (Oral)   Ht 5' 8.5" (1.74 m)   Wt 216 lb (98 kg)   SpO2 93%   BMI 32.36 kg/m   Wt Readings from Last 5 Encounters:  11/10/18 216 lb (98 kg)  11/08/18 215 lb (97.5 kg)  10/10/18 217 lb (98.4 kg)  09/28/18 217 lb (98.4 kg)  09/26/18 219 lb (99.3 kg)     Physical Exam Vitals signs and nursing note reviewed.  Constitutional:      General: He is not in acute distress.    Appearance: He is well-developed.  Cardiovascular:     Rate and Rhythm: Normal rate and regular rhythm.  Pulmonary:      Effort: Pulmonary effort is normal. No respiratory distress.     Breath sounds: Wheezing and rhonchi (bilateral bases) present.  Musculoskeletal:        General: No swelling.  Skin:    General: Skin is warm and dry.  Neurological:     Mental Status: He is alert and oriented to person, place, and time.      Imaging: Dg Chest 2 View  Result Date: 11/10/2018 CLINICAL DATA:  63 year old male with a history of increasing shortness of breath over 4 months EXAM: CHEST - 2 VIEW COMPARISON:  08/30/2018, CT chest 08/07/2018 FINDINGS: Cardiomediastinal silhouette unchanged in size and contour. Surgical changes of median sternotomy. Increasing ill-defined reticulonodular opacity of the right greater than left lung. No pleural effusion. No pneumothorax. Stigmata of emphysema, with increased retrosternal airspace, flattened hemidiaphragms, increased AP diameter, and hyperinflation on the AP view. No displaced fracture IMPRESSION: Increased reticulonodular opacity, potentially bronchitis or atypical infection. Background of emphysema and chronic lung changes. Surgical changes of median sternotomy and CABG Electronically Signed   By: Corrie Mckusick D.O.   On: 11/10/2018 16:07     Assessment & Plan:   COPD, moderate ([place]) Patient states that he has had increased shortness of breath with exertion since beginning of the year.  Had multiple rounds of antibiotics and prednisone tapers in January February and March.  Has been treated by his PCP.  He states that he is having to take more frequent breaks to catch his breath while working outside in the yard.  He does stay active.  Has been using albuterol nebulizer treatments twice daily.  He has been using his Anoro.  States that he does not feel it is helping.  Denies any recent fever.  Will check chest x-ray and labs today.  Patient Instructions  COPD with persistent shortness of breath: Will check chest x ray Will check: BNP, D-dimer, CMP, CBC Will trial  trelegy - samples given Will order duo nebs as needed Use albuterol as needed for chest tightness wheezing or shortness of breath or prior to physical activity Practice good hand hygiene  Follow up: Follow-up in 2 months with Dr. Lake Lee or sooner if needed       Fenton Foy, NP 11/10/2018

## 2018-11-10 NOTE — Patient Instructions (Addendum)
COPD with persistent shortness of breath: Will check chest x ray Will check: BNP, D-dimer, CMP, CBC Will trial trelegy - samples given Will order duo nebs as needed Use albuterol as needed for chest tightness wheezing or shortness of breath or prior to physical activity Practice good hand hygiene  Follow up: Follow-up in 2 months with Dr. [name] or sooner if needed

## 2018-11-10 NOTE — Telephone Encounter (Signed)
Called and spoke with pt stating the recommendations per Aaron Edelman. Asked him if he would prefer to have televisit scheduled or face-to-face visit and pt stated he would like to come in for a face-to-face visit so he can have lungs further listened to. I have scheduled pt for OV with TN today, 5/1 at 3:00. Pt's covid screen was negative as he has not had any fever, no recent travel, no contact with anyone that has been sick. Pt was provided with our new office address. Nothing further needed.

## 2018-11-10 NOTE — Telephone Encounter (Signed)
Please see if you get the patient scheduled with TN for later on today.  This can need to be a tele-visit or an office visit.  Please ensure the patient has been afebrile.  Patient may need chest x-ray.  If patient is unable to be scheduled to come out for an office visit today we may need to consider telephonic treatment to get him through the weekend safely.  Please follow-up with me if that is needed.  Wyn Quaker, FNP

## 2018-11-10 NOTE — Telephone Encounter (Signed)
Primary Pulmonologist: BQ Last office visit and with whom: 02/01/18 with BQ What do we see them for (pulmonary problems): COPD Last OV assessment/plan: Instructions  Return in about 6 months (around 08/04/2018).  COPD with persistent problems of shortness of breath: Continue Anoro 1 puff daily Use albuterol as needed for chest tightness wheezing or shortness of breath or prior to physical activity Get a flu shot in the fall Practice good hand hygiene Follow-up in 6 months       Was appointment offered to patient (explain)?  Pt would like to have an appt   Reason for call: Called and spoke with pt who stated his breathing has been worse since January 2020. Pt stated he had been going to PCP and went to see PCP in January when breathing became worse and was told he had bronchitis and was put on steroid and abx. After that round of steroid and abx, breathing got worse again and pt called PCP again and was put on another round of steroid.   Pt stated about 4 weeks ago, he called PCP again and was put on another round of pred and steroid by PCP.  Pt is using nebulizer twice daily but stated when his breathing had become worse, he was doing it three times daily but then PCP told pt to go back down to twice daily. Pt states that he has had to use his rescue inhaler about 3 times daily which helps some but not helped completely. Pt stated he called PCP Monday, 4/27 and then again Wednesday, 4/29 in regards to his breathing. Pt stated in the mornings his breathing is worse and he stated that he cannot walk more than about 45feet before breathing is worse.  Pt states today 5/1 his breathing is worse. Asked pt if he had a pulse ox that he could use to check sats and pt stated he did. Pt stated sats have been running in the mid 90s. While I had pt on the phone, I had pt check O2 sats and pt was satting 93-95% on room air. Pt stated with exertion, the lowest he has seen his sats be have been 90-91%. Pt denies  any fever, no recent travel, and has not been around anyone that has been sick.  Pt would like to know recommendations to help with symptoms. Aaron Edelman, please advise on this for pt. Thanks!

## 2018-11-10 NOTE — Progress Notes (Signed)
Reviewed, agree 

## 2018-11-10 NOTE — Assessment & Plan Note (Addendum)
Patient states that he has had increased shortness of breath with exertion since beginning of the year.  Had multiple rounds of antibiotics and prednisone tapers in January February and March.  Has been treated by his PCP.  He states that he is having to take more frequent breaks to catch his breath while working outside in the yard.  He does stay active.  Has been using albuterol nebulizer treatments twice daily.  He has been using his Anoro.  States that he does not feel it is helping.  Denies any recent fever.  Will check chest x-ray and labs today.  Patient Instructions  COPD with persistent shortness of breath: Will check chest x ray Will check: BNP, D-dimer, CMP, CBC Will trial trelegy - samples given Will order duo nebs as needed Use albuterol as needed for chest tightness wheezing or shortness of breath or prior to physical activity Practice good hand hygiene  Follow up: Follow-up in 2 months with Dr. [name] or sooner if needed

## 2018-11-11 LAB — CBC WITH DIFFERENTIAL/PLATELET
Absolute Monocytes: 1059 cells/uL — ABNORMAL HIGH (ref 200–950)
Basophils Absolute: 89 cells/uL (ref 0–200)
Basophils Relative: 1 %
Eosinophils Absolute: 392 cells/uL (ref 15–500)
Eosinophils Relative: 4.4 %
HCT: 46.7 % (ref 38.5–50.0)
Hemoglobin: 16.4 g/dL (ref 13.2–17.1)
Lymphs Abs: 2367 cells/uL (ref 850–3900)
MCH: 32.7 pg (ref 27.0–33.0)
MCHC: 35.1 g/dL (ref 32.0–36.0)
MCV: 93.2 fL (ref 80.0–100.0)
MPV: 10.1 fL (ref 7.5–12.5)
Monocytes Relative: 11.9 %
Neutro Abs: 4993 cells/uL (ref 1500–7800)
Neutrophils Relative %: 56.1 %
Platelets: 274 10*3/uL (ref 140–400)
RBC: 5.01 10*6/uL (ref 4.20–5.80)
RDW: 12.3 % (ref 11.0–15.0)
Total Lymphocyte: 26.6 %
WBC: 8.9 10*3/uL (ref 3.8–10.8)

## 2018-11-11 LAB — COMPREHENSIVE METABOLIC PANEL
AG Ratio: 2 (calc) (ref 1.0–2.5)
ALT: 9 U/L (ref 9–46)
AST: 13 U/L (ref 10–35)
Albumin: 4.3 g/dL (ref 3.6–5.1)
Alkaline phosphatase (APISO): 64 U/L (ref 35–144)
BUN: 15 mg/dL (ref 7–25)
CO2: 28 mmol/L (ref 20–32)
Calcium: 9.4 mg/dL (ref 8.6–10.3)
Chloride: 106 mmol/L (ref 98–110)
Creat: 0.82 mg/dL (ref 0.70–1.25)
Globulin: 2.1 g/dL (calc) (ref 1.9–3.7)
Glucose, Bld: 113 mg/dL — ABNORMAL HIGH (ref 65–99)
Potassium: 4 mmol/L (ref 3.5–5.3)
Sodium: 141 mmol/L (ref 135–146)
Total Bilirubin: 0.7 mg/dL (ref 0.2–1.2)
Total Protein: 6.4 g/dL (ref 6.1–8.1)

## 2018-11-11 LAB — BRAIN NATRIURETIC PEPTIDE: Brain Natriuretic Peptide: 87 pg/mL (ref <100)

## 2018-11-11 LAB — D-DIMER, QUANTITATIVE: D-Dimer, Quant: 2.03 mcg/mL FEU — ABNORMAL HIGH (ref <0.50)

## 2018-11-13 ENCOUNTER — Telehealth: Payer: Self-pay | Admitting: Pulmonary Disease

## 2018-11-13 ENCOUNTER — Other Ambulatory Visit: Payer: Self-pay | Admitting: General Surgery

## 2018-11-13 ENCOUNTER — Telehealth: Payer: Self-pay | Admitting: Family Medicine

## 2018-11-13 ENCOUNTER — Ambulatory Visit (INDEPENDENT_AMBULATORY_CARE_PROVIDER_SITE_OTHER): Payer: PPO | Admitting: Family Medicine

## 2018-11-13 ENCOUNTER — Ambulatory Visit
Admission: RE | Admit: 2018-11-13 | Discharge: 2018-11-13 | Disposition: A | Discharge status: Home / Self Care | Payer: PPO | Source: Ambulatory Visit | Attending: Nurse Practitioner | Admitting: Nurse Practitioner

## 2018-11-13 ENCOUNTER — Other Ambulatory Visit: Payer: Self-pay

## 2018-11-13 ENCOUNTER — Encounter: Payer: Self-pay | Admitting: Family Medicine

## 2018-11-13 VITALS — BP 155/88 | HR 81 | Temp 98.6°F | Ht 69.5 in | Wt 220.0 lb

## 2018-11-13 DIAGNOSIS — J449 Chronic obstructive pulmonary disease, unspecified: Secondary | ICD-10-CM

## 2018-11-13 DIAGNOSIS — E118 Type 2 diabetes mellitus with unspecified complications: Secondary | ICD-10-CM | POA: Diagnosis not present

## 2018-11-13 DIAGNOSIS — R51 Headache: Secondary | ICD-10-CM | POA: Diagnosis not present

## 2018-11-13 DIAGNOSIS — R7989 Other specified abnormal findings of blood chemistry: Secondary | ICD-10-CM

## 2018-11-13 DIAGNOSIS — R0602 Shortness of breath: Secondary | ICD-10-CM

## 2018-11-13 DIAGNOSIS — K225 Diverticulum of esophagus, acquired: Secondary | ICD-10-CM

## 2018-11-13 DIAGNOSIS — R350 Frequency of micturition: Secondary | ICD-10-CM

## 2018-11-13 DIAGNOSIS — Z8551 Personal history of malignant neoplasm of bladder: Secondary | ICD-10-CM

## 2018-11-13 DIAGNOSIS — K8020 Calculus of gallbladder without cholecystitis without obstruction: Secondary | ICD-10-CM

## 2018-11-13 DIAGNOSIS — R519 Headache, unspecified: Secondary | ICD-10-CM

## 2018-11-13 DIAGNOSIS — G43909 Migraine, unspecified, not intractable, without status migrainosus: Secondary | ICD-10-CM | POA: Diagnosis not present

## 2018-11-13 MED ORDER — IOHEXOL 350 MG/ML SOLN
75.0000 mL | Freq: Once | INTRAVENOUS | Status: AC | PRN
  Administered 2018-11-13: 14:00:00 75 mL via INTRAVENOUS

## 2018-11-13 NOTE — Telephone Encounter (Signed)
Called and spoke with patient regarding results from [facility] recommendation Hinton Dyer has already spoke with patient Informed the patient of results and recommendations today. Pt verbalized understanding and denied any questions or concerns at this time.  Nothing further needed.

## 2018-11-13 NOTE — Telephone Encounter (Signed)
Patient had a CT Scan done today and they stated the patient has gallstones and that the patient needed to contact our office to see what needs to be done next.  BEST PHONE- [phone]

## 2018-11-13 NOTE — Progress Notes (Signed)
Yes. The CTA would be now. Thanks.

## 2018-11-13 NOTE — Progress Notes (Signed)
CTA

## 2018-11-13 NOTE — Assessment & Plan Note (Signed)
Ongoing trouble, further evaluation has been postponed due to [place].

## 2018-11-13 NOTE — Assessment & Plan Note (Addendum)
Ongoing progressive dyspnea with cough despite multiple treatments for COPD exacerbation in the last few months (zpack/prednisone, then prednisone, then levaquin/prednisone). Recently saw pulm with CXR suggesting possible atypical PNA now on another azithromycin course.  Recent D dimer elevated - will touch base with pulm about ordering CTA lungs. Pt agrees with plan.  ADDENDUM ==> reviewing chart, it seems pulm is already in process of ordering CTA so I will not order.

## 2018-11-13 NOTE — Assessment & Plan Note (Addendum)
Anticipate dental disease related - known cavity L upper molar with temporary crown, definitive treatment has been delayed due to [place] concerns.

## 2018-11-13 NOTE — Assessment & Plan Note (Addendum)
Chronic, stable. Continue current regimen of diet control. Declines DSME at this time.

## 2018-11-13 NOTE — Telephone Encounter (Signed)
Called and spoke with patient regarding results.  Informed the patient of results and recommendations today. Pt verbalized understanding and denied any questions or concerns at this time.  Nothing further needed.  

## 2018-11-13 NOTE — Assessment & Plan Note (Addendum)
Feels stable period on lower gabapentin dose - continue this

## 2018-11-13 NOTE — Assessment & Plan Note (Signed)
He will call uro for sooner f/u.

## 2018-11-13 NOTE — Assessment & Plan Note (Signed)
Ongoing LUTS - will call uro for sooner f/u

## 2018-11-13 NOTE — Assessment & Plan Note (Signed)
Appreciate pulm care. Now on trelegy trial (samples) in place of anoro. See below.

## 2018-11-13 NOTE — Progress Notes (Signed)
Virtual visit completed through [employer]. Due to national recommendations of social distancing due to [place] 19, a virtual visit is felt to be most appropriate for this patient at this time.   Patient location: home Provider location: [place] at [facility], office If any vitals were documented, they were collected by patient at home unless specified below.    BP (!) 155/88 (BP Location: Right Arm, Patient Position: Sitting)   Pulse 81   Temp 98.6 F (37 C)   Ht 5' 9.5" (1.765 m)   Wt 220 lb (99.8 kg)   BMI 32.02 kg/m    CC: 4 mo f/u visit Subjective:    Patient ID: Carl Lee, male    DOB: 1956-04-17, 63 y.o.   MRN: [id number]  HPI: Carl Lee is a 63 y.o. male presenting on 11/13/2018 for Follow-up (4 mo f/u. Pt mentioned had appt on 11/10/18 with pulmonology. Had CXR, dx pneumonia. )   See prior notes for details. Recent urinary symptoms with reassuring UA and UCx - pt is to call urologist for f/u in h/o bladder cancer. He has restarted flomax nightly with mild benefit. He will call uro to follow up.   Migraine - last physical gabapentin decreased to nightly due to affecting mood. Feels mood and migraines are staying controlled on lower gabapentin dosing.   Known zenker diverticulum - barium swallow has been postponed due to Covid19 concerns. Marked dysphagia due to this. Has temporary crown L upper molars with adjacent cavity that needs to be fixed.   Known COPD on scheduled daily anoro and PRN albuterol neb/inhaler (2-3 times a week). Saw pulm last week given ongoing dyspnea since the beginning of the year, dx with atypical PNA treated with azithromycin. He was also started on trelegy in place of anoro. Reviewed recent labs with patient. BNP was normal, D dimer was elevated. Denies unilateral leg swelling.   DM - does regularly check sugars fasting QOD - well controlled per pt recall (90-120). Compliant with antihyperglycemic regimen which includes: diet control. Denies low  sugars or hypoglycemic symptoms. Denies paresthesias. Last diabetic eye exam 03/2018. Pneumovax: 2015. Prevnar: 2016. Glucometer brand: one-touch. DSME: declines at this time. Has had several prednisone courses in the past few months.  Lab Results  Component Value Date   HGBA1C 6.5 (A) 11/08/2018   Diabetic Foot Exam - Simple   No data filed     Lab Results  Component Value Date   MICROALBUR <0.7 07/13/2018        Relevant past medical, surgical, family and social history reviewed and updated as indicated. Interim medical history since our last visit reviewed. Allergies and medications reviewed and updated. Outpatient Medications Prior to Visit  Medication Sig Dispense Refill  . acetaminophen (TYLENOL) 325 MG tablet Take 2 tablets (650 mg total) by mouth every 6 (six) hours as needed for moderate pain or headache.    . albuterol (PROAIR HFA) 108 (90 Base) MCG/ACT inhaler Inhale 2 puffs into the lungs every 4 (four) hours as needed for wheezing or shortness of breath. 8.5 Inhaler 2  . albuterol (PROVENTIL) (2.5 MG/3ML) 0.083% nebulizer solution Take 3 mLs (2.5 mg total) by nebulization every 6 (six) hours as needed for wheezing or shortness of breath. 75 mL 12  . amLODipine (NORVASC) 5 MG tablet TAKE 1 TABLET BY MOUTH EVERY DAY 90 tablet 3  . aspirin EC 325 MG EC tablet Take 1 tablet (325 mg total) by mouth daily.    [name] atorvastatin (  LIPITOR) 20 MG tablet TAKE 1 TABLET (20 MG TOTAL) BY MOUTH DAILY AT 6 PM. 90 tablet 3  . azithromycin (ZITHROMAX) 250 MG tablet Take 2 tablets (500 mg) on day 1, then take 1 tablet (250 mg) on days 2-5 6 tablet 0  . famotidine (PEPCID) 20 MG tablet TAKE 1 TABLET (20 MG TOTAL) BY MOUTH 2 (TWO) TIMES DAILY. 180 tablet 1  . Fluticasone-Umeclidin-Vilant (TRELEGY ELLIPTA) 100-62.5-25 MCG/INH AEPB Inhale 1 puff into the lungs daily. 2 each 0  . gabapentin (NEURONTIN) 300 MG capsule Take 1 capsule (300 mg total) by mouth at bedtime. First 3 days take once nightly 30  capsule 11  . glucose blood (ONE TOUCH ULTRA TEST) test strip CHECK ONCE DAILY AND AS NEEDED    . ipratropium-albuterol (DUONEB) 0.5-2.5 (3) MG/3ML SOLN Take 3 mLs by nebulization every 6 (six) hours as needed for up to 30 days. 360 mL 0  . metoprolol tartrate (LOPRESSOR) 25 MG tablet Take 0.5 tablets (12.5 mg total) by mouth 2 (two) times daily. 90 tablet 3  . oxyCODONE-acetaminophen (PERCOCET/ROXICET) 5-325 MG tablet Take 1-2 tablets by mouth every 4 (four) hours as needed for severe pain. 30 tablet 0  . tamsulosin (FLOMAX) 0.4 MG CAPS capsule Take 1 capsule (0.4 mg total) by mouth daily after supper.    . umeclidinium-vilanterol (ANORO ELLIPTA) 62.5-25 MCG/INH AEPB Inhale 1 puff into the lungs daily. 60 each 2  . vitamin B-12 (CYANOCOBALAMIN) 500 MCG tablet Take 1 tablet (500 mcg total) by mouth every other day.     No facility-administered medications prior to visit.      Per HPI unless specifically indicated in ROS section below Review of Systems Objective:    BP (!) 155/88 (BP Location: Right Arm, Patient Position: Sitting)   Pulse 81   Temp 98.6 F (37 C)   Ht 5' 9.5" (1.765 m)   Wt 220 lb (99.8 kg)   BMI 32.02 kg/m   Wt Readings from Last 3 Encounters:  11/13/18 220 lb (99.8 kg)  11/10/18 216 lb (98 kg)  11/08/18 215 lb (97.5 kg)     Physical exam: Gen: alert, NAD, not ill appearing Pulm: speaks in complete sentences without increased work of breathing Psych: normal mood, normal thought content      Results for orders placed or performed in visit on 11/10/18  Comprehensive metabolic panel  Result Value Ref Range   Glucose, Bld 113 (H) 65 - 99 mg/dL   BUN 15 7 - 25 mg/dL   Creat 0.82 0.70 - 1.25 mg/dL   BUN/Creatinine Ratio NOT APPLICABLE 6 - 22 (calc)   Sodium 141 135 - 146 mmol/L   Potassium 4.0 3.5 - 5.3 mmol/L   Chloride 106 98 - 110 mmol/L   CO2 28 20 - 32 mmol/L   Calcium 9.4 8.6 - 10.3 mg/dL   Total Protein 6.4 6.1 - 8.1 g/dL   Albumin 4.3 3.6 - 5.1 g/dL    Globulin 2.1 1.9 - 3.7 g/dL (calc)   AG Ratio 2.0 1.0 - 2.5 (calc)   Total Bilirubin 0.7 0.2 - 1.2 mg/dL   Alkaline phosphatase (APISO) 64 35 - 144 U/L   AST 13 10 - 35 U/L   ALT 9 9 - 46 U/L  Brain natriuretic peptide  Result Value Ref Range   Brain Natriuretic Peptide 87 <100 pg/mL  CBC with Differential/Platelet  Result Value Ref Range   WBC 8.9 3.8 - 10.8 Thousand/uL   RBC 5.01 4.20 - 5.80  Million/uL   Hemoglobin 16.4 13.2 - 17.1 g/dL   HCT 46.7 38.5 - 50.0 %   MCV 93.2 80.0 - 100.0 fL   MCH 32.7 27.0 - 33.0 pg   MCHC 35.1 32.0 - 36.0 g/dL   RDW 12.3 11.0 - 15.0 %   Platelets 274 140 - 400 Thousand/uL   MPV 10.1 7.5 - 12.5 fL   Neutro Abs 4,993 1,500 - 7,800 cells/uL   Lymphs Abs 2,367 850 - 3,900 cells/uL   Absolute Monocytes 1,059 (H) 200 - 950 cells/uL   Eosinophils Absolute 392 15 - 500 cells/uL   Basophils Absolute 89 0 - 200 cells/uL   Neutrophils Relative % 56.1 %   Total Lymphocyte 26.6 %   Monocytes Relative 11.9 %   Eosinophils Relative 4.4 %   Basophils Relative 1.0 %  D-Dimer, Quantitative  Result Value Ref Range   D-Dimer, Quant 2.03 (H) <0.50 mcg/mL FEU   Lab Results  Component Value Date   HGBA1C 6.5 (A) 11/08/2018    Assessment & Plan:   Problem List Items Addressed This Visit    Zenker diverticulum    Ongoing trouble, further evaluation has been postponed due to [place].      Urinary frequency    Ongoing LUTS - will call uro for sooner f/u      Migraine    Feels stable period on lower gabapentin dose - continue this      History of bladder cancer    He will call uro for sooner f/u.       Headache    Anticipate dental disease related - known cavity L upper molar with temporary crown, definitive treatment has been delayed due to [place] concerns.       Dyspnea    Ongoing progressive dyspnea with cough despite multiple treatments for COPD exacerbation in the last few months (zpack/prednisone, then prednisone, then  levaquin/prednisone). Recently saw pulm with CXR suggesting possible atypical PNA now on another azithromycin course.  Recent D dimer elevated - will touch base with pulm about ordering CTA lungs. Pt agrees with plan.  ADDENDUM ==> reviewing chart, it seems pulm is already in process of ordering CTA so I will not order.       COPD, moderate ([place])    Appreciate pulm care. Now on trelegy trial (samples) in place of anoro. See below.       Controlled diabetes mellitus type 2 with complications (HCC) - Primary    Chronic, stable. Continue current regimen of diet control. Declines DSME at this time.           No orders of the defined types were placed in this encounter.  No orders of the defined types were placed in this encounter.   Follow up plan: No follow-ups on file.  [name], MD

## 2018-11-14 ENCOUNTER — Ambulatory Visit: Payer: PPO

## 2018-11-14 ENCOUNTER — Ambulatory Visit (INDEPENDENT_AMBULATORY_CARE_PROVIDER_SITE_OTHER): Payer: PPO | Admitting: Family Medicine

## 2018-11-14 ENCOUNTER — Encounter: Payer: Self-pay | Admitting: Family Medicine

## 2018-11-14 ENCOUNTER — Telehealth: Payer: Self-pay | Admitting: Nurse Practitioner

## 2018-11-14 VITALS — BP 143/88 | HR 57 | Temp 97.9°F | Ht 69.5 in | Wt 218.0 lb

## 2018-11-14 DIAGNOSIS — E739 Lactose intolerance, unspecified: Secondary | ICD-10-CM | POA: Diagnosis not present

## 2018-11-14 DIAGNOSIS — G4733 Obstructive sleep apnea (adult) (pediatric): Secondary | ICD-10-CM | POA: Diagnosis not present

## 2018-11-14 DIAGNOSIS — K8020 Calculus of gallbladder without cholecystitis without obstruction: Secondary | ICD-10-CM | POA: Diagnosis not present

## 2018-11-14 DIAGNOSIS — J449 Chronic obstructive pulmonary disease, unspecified: Secondary | ICD-10-CM | POA: Diagnosis not present

## 2018-11-14 HISTORY — DX: Lactose intolerance, unspecified: E73.9

## 2018-11-14 NOTE — Telephone Encounter (Signed)
Ok to keep today's appt to review.

## 2018-11-14 NOTE — Progress Notes (Signed)
Virtual visit completed through [employer]. Due to national recommendations of social distancing due to [place] 19, a virtual visit is felt to be most appropriate for this patient at this time.   Patient location: home Provider location: [place] at [facility], office If any vitals were documented, they were collected by patient at home unless specified below.    BP (!) 143/88 (BP Location: Left Arm, Patient Position: Sitting)   Pulse (!) 57   Temp 97.9 F (36.6 C)   Ht 5' 9.5" (1.765 m)   Wt 218 lb (98.9 kg)   BMI 31.73 kg/m    CC: discuss recent CT results. Subjective:    Patient ID: Carl Lee, male    DOB: 06-24-1956, 63 y.o.   MRN: [id number]  HPI: Carl Lee is a 63 y.o. male presenting on 11/14/2018 for Results (Wants to discuss CT results from 11/13/18.)   See recent phone note. CTA lungs done for elevated D dimer - returned reassuringly negative for PE. However did show gallstones. Also showed mild emphysema changes and reactive or infectious bronchiolitis changes. He is on zpack for atypical infection suspected on CXR 11/10/2018.   Patient has questions about gallstones noted on CT.   At times notes RUQ abd discomfort with radiation to upper back. Describes 6-7/10 dull ache that eases off over time. This pain wakes him up in the mornings. Previously attributed to back. No worsening of RUQ pain with pork chop.    Stopped eating steaks and ice cream due to diarrhea. Endorses lactose intolerance. Already avoids fatty greasy foods due to heart history.      Relevant past medical, surgical, family and social history reviewed and updated as indicated. Interim medical history since our last visit reviewed. Allergies and medications reviewed and updated. Outpatient Medications Prior to Visit  Medication Sig Dispense Refill  . acetaminophen (TYLENOL) 325 MG tablet Take 2 tablets (650 mg total) by mouth every 6 (six) hours as needed for moderate pain or headache.    . albuterol  (PROAIR HFA) 108 (90 Base) MCG/ACT inhaler Inhale 2 puffs into the lungs every 4 (four) hours as needed for wheezing or shortness of breath. 8.5 Inhaler 2  . albuterol (PROVENTIL) (2.5 MG/3ML) 0.083% nebulizer solution Take 3 mLs (2.5 mg total) by nebulization every 6 (six) hours as needed for wheezing or shortness of breath. 75 mL 12  . amLODipine (NORVASC) 5 MG tablet TAKE 1 TABLET BY MOUTH EVERY DAY 90 tablet 3  . aspirin EC 325 MG EC tablet Take 1 tablet (325 mg total) by mouth daily.    [name] atorvastatin (LIPITOR) 20 MG tablet TAKE 1 TABLET (20 MG TOTAL) BY MOUTH DAILY AT 6 PM. 90 tablet 3  . azithromycin (ZITHROMAX) 250 MG tablet Take 2 tablets (500 mg) on day 1, then take 1 tablet (250 mg) on days 2-5 6 tablet 0  . famotidine (PEPCID) 20 MG tablet TAKE 1 TABLET (20 MG TOTAL) BY MOUTH 2 (TWO) TIMES DAILY. 180 tablet 1  . Fluticasone-Umeclidin-Vilant (TRELEGY ELLIPTA) 100-62.5-25 MCG/INH AEPB Inhale 1 puff into the lungs daily. 2 each 0  . gabapentin (NEURONTIN) 300 MG capsule Take 1 capsule (300 mg total) by mouth at bedtime. First 3 days take once nightly 30 capsule 11  . glucose blood (ONE TOUCH ULTRA TEST) test strip CHECK ONCE DAILY AND AS NEEDED    . ipratropium-albuterol (DUONEB) 0.5-2.5 (3) MG/3ML SOLN Take 3 mLs by nebulization every 6 (six) hours as needed for up to  30 days. 360 mL 0  . metoprolol tartrate (LOPRESSOR) 25 MG tablet Take 0.5 tablets (12.5 mg total) by mouth 2 (two) times daily. 90 tablet 3  . oxyCODONE-acetaminophen (PERCOCET/ROXICET) 5-325 MG tablet Take 1-2 tablets by mouth every 4 (four) hours as needed for severe pain. 30 tablet 0  . tamsulosin (FLOMAX) 0.4 MG CAPS capsule Take 1 capsule (0.4 mg total) by mouth daily after supper.    . umeclidinium-vilanterol (ANORO ELLIPTA) 62.5-25 MCG/INH AEPB Inhale 1 puff into the lungs daily. 60 each 2  . vitamin B-12 (CYANOCOBALAMIN) 500 MCG tablet Take 1 tablet (500 mcg total) by mouth every other day.     No  facility-administered medications prior to visit.      Per HPI unless specifically indicated in ROS section below Review of Systems Objective:    BP (!) 143/88 (BP Location: Left Arm, Patient Position: Sitting)   Pulse (!) 57   Temp 97.9 F (36.6 C)   Ht 5' 9.5" (1.765 m)   Wt 218 lb (98.9 kg)   BMI 31.73 kg/m   Wt Readings from Last 3 Encounters:  11/14/18 218 lb (98.9 kg)  11/13/18 220 lb (99.8 kg)  11/10/18 216 lb (98 kg)     Physical exam: Gen: alert, NAD, not ill appearing Pulm: speaks in complete sentences without increased work of breathing Psych: normal mood, normal thought content      Results for orders placed or performed in visit on 11/10/18  Comprehensive metabolic panel  Result Value Ref Range   Glucose, Bld 113 (H) 65 - 99 mg/dL   BUN 15 7 - 25 mg/dL   Creat 0.82 0.70 - 1.25 mg/dL   BUN/Creatinine Ratio NOT APPLICABLE 6 - 22 (calc)   Sodium 141 135 - 146 mmol/L   Potassium 4.0 3.5 - 5.3 mmol/L   Chloride 106 98 - 110 mmol/L   CO2 28 20 - 32 mmol/L   Calcium 9.4 8.6 - 10.3 mg/dL   Total Protein 6.4 6.1 - 8.1 g/dL   Albumin 4.3 3.6 - 5.1 g/dL   Globulin 2.1 1.9 - 3.7 g/dL (calc)   AG Ratio 2.0 1.0 - 2.5 (calc)   Total Bilirubin 0.7 0.2 - 1.2 mg/dL   Alkaline phosphatase (APISO) 64 35 - 144 U/L   AST 13 10 - 35 U/L   ALT 9 9 - 46 U/L  Brain natriuretic peptide  Result Value Ref Range   Brain Natriuretic Peptide 87 <100 pg/mL  CBC with Differential/Platelet  Result Value Ref Range   WBC 8.9 3.8 - 10.8 Thousand/uL   RBC 5.01 4.20 - 5.80 Million/uL   Hemoglobin 16.4 13.2 - 17.1 g/dL   HCT 46.7 38.5 - 50.0 %   MCV 93.2 80.0 - 100.0 fL   MCH 32.7 27.0 - 33.0 pg   MCHC 35.1 32.0 - 36.0 g/dL   RDW 12.3 11.0 - 15.0 %   Platelets 274 140 - 400 Thousand/uL   MPV 10.1 7.5 - 12.5 fL   Neutro Abs 4,993 1,500 - 7,800 cells/uL   Lymphs Abs 2,367 850 - 3,900 cells/uL   Absolute Monocytes 1,059 (H) 200 - 950 cells/uL   Eosinophils Absolute 392 15 - 500  cells/uL   Basophils Absolute 89 0 - 200 cells/uL   Neutrophils Relative % 56.1 %   Total Lymphocyte 26.6 %   Monocytes Relative 11.9 %   Eosinophils Relative 4.4 %   Basophils Relative 1.0 %  D-Dimer, Quantitative  Result Value Ref Range  D-Dimer, Quant 2.03 (H) <0.50 mcg/mL FEU   CT ANGIO CHEST PE W OR WO CONTRAST CLINICAL DATA:  Worsening shortness of breath and productive cough with congestion x3 months.  EXAM: CT ANGIOGRAPHY CHEST WITH CONTRAST  TECHNIQUE: Multidetector CT imaging of the chest was performed using the standard protocol during bolus administration of intravenous contrast. Multiplanar CT image reconstructions and MIPs were obtained to evaluate the vascular anatomy.  CONTRAST:  38mL OMNIPAQUE IOHEXOL 350 MG/ML SOLN  COMPARISON:  CT chest dated 09/16/2015  FINDINGS: Cardiovascular: Satisfactory opacification of the pulmonary arteries to the segmental level. No evidence of pulmonary embolism. Normal heart size. No pericardial effusion. Patient is status post prior median sternotomy.  Mediastinum/Nodes: No enlarged mediastinal, hilar, or axillary lymph nodes. Thyroid gland, trachea, and esophagus demonstrate no significant findings.  Lungs/Pleura: There are mild emphysematous changes bilaterally. There is some bronchial wall thickening and mucus plugging at the lung bases bilaterally, right worse than left.  Upper Abdomen: There is probable hepatic steatosis. Gallstones are noted. Scattered colonic diverticula are seen involving the proximal descending colon.  Musculoskeletal: No chest wall abnormality. No acute or significant osseous findings.  Review of the MIP images confirms the above findings.  IMPRESSION: 1. No PE identified. 2. Mild bronchial wall thickening and mucus plugging at the lung bases bilaterally which can be seen in patients with infectious or reactive bronchiolitis. 3. Mild emphysematous changes bilaterally. 4.  Cholelithiasis.  Electronically Signed   By: Constance Holster M.D.   On: 11/13/2018 14:18   Assessment & Plan:   Problem List Items Addressed This Visit    Lactose intolerance    Endorses longstanding history of this - reviewed continued avoidance of dairy products.       Gallstones - Primary    Noted on CT yesterday - first time imaging study shows gallstones. Endorses years of RUQ discomfort worse in the mornings. Not quite consistent with biliary colic but a possibility. Discussed option of gen surgery eval however he is high risk for surgery. He declines referral for now, will watch symptoms, let me know if symptoms persist or worsen. Red flags to seek urgent care (ie jaundice, nausea/vomiting, fever, worsening RUQ pain).           No orders of the defined types were placed in this encounter.  No orders of the defined types were placed in this encounter.   Follow up plan: No follow-ups on file.  [name], MD

## 2018-11-14 NOTE — Telephone Encounter (Signed)
Noted. Pt made aware.

## 2018-11-14 NOTE — Assessment & Plan Note (Signed)
Endorses longstanding history of this - reviewed continued avoidance of dairy products.

## 2018-11-14 NOTE — Assessment & Plan Note (Signed)
Noted on CT yesterday - first time imaging study shows gallstones. Endorses years of RUQ discomfort worse in the mornings. Not quite consistent with biliary colic but a possibility. Discussed option of gen surgery eval however he is high risk for surgery. He declines referral for now, will watch symptoms, let me know if symptoms persist or worsen. Red flags to seek urgent care (ie jaundice, nausea/vomiting, fever, worsening RUQ pain).

## 2018-11-14 NOTE — Telephone Encounter (Signed)
Spoke with pt relaying Dr. Synthia Innocent message. Pt verbalizes understanding.  States he has pain in RUQ when waking in the mornings.  Denies any fever, nausea or vomiting. Says he was told by pulmonology to "get with someone as soon as possible" about the gallstones. Pt wants details about results (ie, how many stones, etc.).  Do we c/x today's visit?

## 2018-11-14 NOTE — Telephone Encounter (Signed)
I'm glad no PE noted. Gallstones don't necessarily mean he needs to have them removed. May not cause any symptoms or problems.  Watch for right upper quadrant abdominal pain or fever, nausea/vomiting, skin turning yellow - all these would be signs we need to do something about them. Has he had any RUQ abd pain before? Avoid fatty greasy fried meals which can cause gallstones to become symptomatic.

## 2018-11-14 NOTE — Telephone Encounter (Signed)
Spoke with patient. He stated that he was advised that [facility] was sending in another antibiotic for him and the pharmacy has not received this yet.   Reviewed patient's chart, I did not see this documented anywhere. I did see where he was contacted yesterday in regards to his CTA results and he was advised to finish his current round of antibiotics.   Tonya, please advise. Thanks!

## 2018-11-15 NOTE — Telephone Encounter (Signed)
Pt made aware

## 2018-11-15 NOTE — Telephone Encounter (Signed)
No -  I told him to continue the azithromycin. It will stay in his system for several days. Please call back later this week or early next week if he does not improve. Thanks.

## 2018-11-15 NOTE — Telephone Encounter (Signed)
Called pt and [facility]. Pt did pick up azith that was on 5/1. He has completed it. He is not feeling any better. He says he may have miss understood but thought after reviewing CTA an additional abx was going to be sent.  Tonya please advise. Thanks!

## 2018-11-15 NOTE — Telephone Encounter (Signed)
I checked the note and it clearly shows that azithromycin was sent to the pharmacy. Could you please resend it as previously ordered if the pharmacy did not get the prescription.

## 2018-12-02 ENCOUNTER — Other Ambulatory Visit: Payer: Self-pay | Admitting: Nurse Practitioner

## 2018-12-05 NOTE — Telephone Encounter (Signed)
Is this appropirate for refill ?

## 2018-12-09 DIAGNOSIS — J449 Chronic obstructive pulmonary disease, unspecified: Secondary | ICD-10-CM | POA: Diagnosis not present

## 2018-12-09 DIAGNOSIS — R269 Unspecified abnormalities of gait and mobility: Secondary | ICD-10-CM | POA: Diagnosis not present

## 2018-12-09 DIAGNOSIS — G4733 Obstructive sleep apnea (adult) (pediatric): Secondary | ICD-10-CM | POA: Diagnosis not present

## 2018-12-11 ENCOUNTER — Telehealth: Payer: Self-pay | Admitting: Nurse Practitioner

## 2018-12-11 MED ORDER — FLUTICASONE-UMECLIDIN-VILANT 100-62.5-25 MCG/INH IN AEPB: 1.0000 | INHALATION_SPRAY | Freq: Every day | RESPIRATORY_TRACT | 0 refills | Status: DC

## 2018-12-11 NOTE — Telephone Encounter (Signed)
rx Trelegy sent to pt pharmacy. Pt notified nothing further needed.Carl Lee

## 2018-12-12 ENCOUNTER — Ambulatory Visit (HOSPITAL_COMMUNITY)
Admission: RE | Admit: 2018-12-12 | Discharge: 2018-12-12 | Disposition: A | Discharge status: Home / Self Care | Payer: PPO | Source: Ambulatory Visit | Attending: Gastroenterology | Admitting: Gastroenterology

## 2018-12-12 ENCOUNTER — Other Ambulatory Visit: Payer: Self-pay

## 2018-12-12 DIAGNOSIS — K225 Diverticulum of esophagus, acquired: Secondary | ICD-10-CM | POA: Insufficient documentation

## 2018-12-12 DIAGNOSIS — R1310 Dysphagia, unspecified: Secondary | ICD-10-CM | POA: Diagnosis not present

## 2018-12-12 DIAGNOSIS — R070 Pain in throat: Secondary | ICD-10-CM | POA: Insufficient documentation

## 2018-12-12 DIAGNOSIS — R1312 Dysphagia, oropharyngeal phase: Secondary | ICD-10-CM | POA: Diagnosis not present

## 2018-12-14 ENCOUNTER — Telehealth: Payer: Self-pay | Admitting: Gastroenterology

## 2018-12-14 NOTE — Telephone Encounter (Signed)
Patient would like to know the results from the Barium Esophagram he had. He also said that he had another Zenker and would like to know what is the plan for that.

## 2018-12-14 NOTE — Telephone Encounter (Signed)
The pt notified that Dr Ardis Hughs has not reviewed the result and as soon as reviewed he will be called with recommendations.

## 2018-12-18 ENCOUNTER — Other Ambulatory Visit: Payer: Self-pay

## 2018-12-18 DIAGNOSIS — K225 Diverticulum of esophagus, acquired: Secondary | ICD-10-CM

## 2018-12-28 ENCOUNTER — Telehealth: Payer: Self-pay | Admitting: *Deleted

## 2018-12-28 NOTE — Telephone Encounter (Signed)
Left S1 tf esi

## 2018-12-28 NOTE — Telephone Encounter (Signed)
Pt is scheduled for 01/22/2019 for Lt S1 TF/lt bursa with driver.

## 2019-01-02 ENCOUNTER — Other Ambulatory Visit: Payer: Self-pay

## 2019-01-02 ENCOUNTER — Telehealth: Payer: Self-pay | Admitting: Physical Medicine and Rehabilitation

## 2019-01-03 ENCOUNTER — Other Ambulatory Visit: Payer: Self-pay | Admitting: Nurse Practitioner

## 2019-01-03 ENCOUNTER — Institutional Professional Consult (permissible substitution): Payer: PPO | Admitting: Cardiothoracic Surgery

## 2019-01-03 ENCOUNTER — Other Ambulatory Visit: Payer: Self-pay | Admitting: Physical Medicine and Rehabilitation

## 2019-01-03 ENCOUNTER — Encounter: Payer: Self-pay | Admitting: Cardiothoracic Surgery

## 2019-01-03 VITALS — BP 151/92 | HR 98 | Resp 18 | Ht 69.5 in | Wt 215.0 lb

## 2019-01-03 DIAGNOSIS — Z951 Presence of aortocoronary bypass graft: Secondary | ICD-10-CM

## 2019-01-03 DIAGNOSIS — K225 Diverticulum of esophagus, acquired: Secondary | ICD-10-CM | POA: Diagnosis not present

## 2019-01-03 DIAGNOSIS — M5416 Radiculopathy, lumbar region: Secondary | ICD-10-CM

## 2019-01-03 MED ORDER — HYDROCODONE-ACETAMINOPHEN 5-325 MG PO TABS: 1.0000 | ORAL_TABLET | Freq: Three times a day (TID) | ORAL | 0 refills | Status: AC | PRN

## 2019-01-03 NOTE — Progress Notes (Signed)
[address]       [place] [address]             [phone]                    Carl Lee [place] Medical Record #[id number] Date of Birth: 10/29/1955  Referring: Norberta Keens, [place]* Primary Care: [name], MD  Chief Complaint:    Cough and sore throat- Zenkers   History of Present Illness:    Carl Lee 63 y.o. male is seen in the office  today for "Zenker's diverticulum". The patient gives a history of having heart surgery February 07 2014 by Dr [name]. At the time of surgery had a transesophageal echo probe placed,  postoperative course was complicated by early extubation requiring reintubation the first postop night. At the time of discharge he  was actively being treated for COPD, postop wheezing. He notes initially after surgery he had trouble swallowing pills, however over the past 3 months this is improved. Patient has not been smoking for 3 months. He had a swallow evaluation and then a upper GI, suggesting upper esophageal sphincter dysfunction and Zenker's diverticulum-see x-ray reports . Patient was originally seen in 2015 his symptoms improved and declined any further evaluation.  Subsequently he noted increasing symptoms as was referred to GI service at [facility].  A transoral procedure was done.  The patient never had any follow-up at [facility] and now returns noting that his symptoms of things sticking when he tries to eat and pain in his left upper neck. A recent barium esophageal study was performed.   AT [facility]: Procedure:     Upper GI endoscopy Indications:    Dysphagia, Zenker's diverticulum, For therapy of Zenker's            diverticulum Providers:     TODD Venetia Constable, MD, Stana Bunting, RN, Corrie Mckusick, Technician Referring MD:   Lewis Shock, MD (Referring MD) Medicines:     General Anesthesia Complications:   No immediate  complications. _______________________________________________________________________________ Procedure:     Pre-Anesthesia Assessment:           - Prior to the procedure, a History and Physical was            performed, and patient medications and allergies were            reviewed. The patient's tolerance of previous anesthesia            was also reviewed. The risks and benefits of the procedure            and the sedation options and risks were discussed with the            patient. All questions were answered, and informed consent            was obtained. Prior Anticoagulants: The patient has taken            aspirin, last dose was day of procedure. ASA Grade            Assessment: III - A patient with severe systemic disease.            After reviewing the risks and benefits, the patient was            deemed in satisfactory condition to undergo the procedure.  After obtaining informed consent, the endoscope was passed            under direct vision. Throughout the procedure, the            patient's blood pressure, pulse, and oxygen saturations            were monitored continuously. The Endoscope was introduced            through the mouth, and advanced to the second part of            duodenum. After obtaining informed consent, the endoscope            was passed under direct vision. Throughout the procedure,            the patient's blood pressure, pulse, and oxygen            saturations were monitored continuously.The upper GI            endoscopy was accomplished without difficulty. The patient            tolerated the procedure well.                                          Findings:    A non-bleeding diverticulum with a large  opening and no stigmata of     recent bleeding was found at the cricopharyngeus. A guide wire was     inserted into the stomach and the endoscope was removed. A 14 Fr     orogastric tube was advanced over the guide wire into the stomach in     order to provide exposure to the septum for cricopharyngeal myotomy.     Placement was confirmed by scope visualization. The endoscope was     reinserted along side the gastric tube. Coagulation for tissue     destruction (cricopharyngeal myotomy) using an Olympus Hook Knife was     successful. To prevent bleeding post-intervention, four hemostatic clips     were successfully placed (MR conditional). There was no bleeding at the     end of the maneuver.    A medium-sized hiatal hernia was present.    AT THE END OF THE PROCEDURE THE OROGASTRIC TUBE WAS REMOVED.                                          Impression:    - Diverticulum at the cricopharyngeus. Successful            cricopharyngeal myotomy. Clips (MR conditional) were            placed.           - Medium-sized hiatal hernia.           - Esophageal mucosal changes consistent with short-segment            Barrett's esophagus.           - Feeding tube placement was successfully performed.           - No specimens collected. Recommendation:  - Patient has a contact number available for emergencies.            The signs and symptoms of potential delayed complications            were  discussed with the patient. Return to normal            activities tomorrow. Written discharge instructions were            provided to the patient.           - Continue present medications.           - Full liquid diet for 1 day, then advance as tolerated to            mechanical soft diet for 1 day.           -  I anticipate no further need for intervention.                                          Procedure Code(s): --- Professional ---           [phone], Esophagogastroduodenoscopy, flexible, transoral;            with ablation of tumor(s), polyp(s), or other lesion(s)            (includes pre- and post-dilation and guide wire passage,            when performed)           43241, Esophagogastroduodenoscopy, flexible, transoral;            with insertion of intraluminal tube or catheter Diagnosis Code(s): --- Professional ---           R13.10, Dysphagia, unspecified           K22.5, Diverticulum of esophagus, acquired  CPT copyright 2017 American Medical Association. All rights reserved.  The codes documented in this report are preliminary and upon coder review may  be revised to meet current compliance requirements.  Electronically Signed By Gayleen Orem, MD ______________________ TODD Venetia Constable, MD 09/12/2017 3:05:54 PM The attending physician was present throughout the entire procedure including  insertion, viewing, and removal. Number of Addenda: 0 Current Activity/ Functional Status:  Patient is independent with mobility/ambulation, transfers, ADL's, IADL's.   Zubrod Score: At the time of surgery this patients most appropriate activity status/level should be described as: [x]     0    Normal activity, no symptoms []     1    Restricted in physical strenuous activity but ambulatory, able to do out light work []     2    Ambulatory and capable of self care, unable to do work activities, up and about               >50 % of waking hours                              []     3    Only limited self care, in bed greater than 50% of waking hours []     4    Completely disabled, no self care, confined to bed or chair []     5    Moribund   Past Medical History:  Diagnosis Date   AAA  (abdominal aortic aneurysm) ([place]) 03/25/12   s/p endovascular repair   Adenomatous polyp 06/2009   Atrial tachycardia, paroxysmal ([place]) 06/18/2016   Bladder cancer ([place]) 08/16/2008   recurrence 2015 and 2018 treated with office fulguration   CAD (coronary artery disease), native coronary artery    S/p 3v CABG  01/2014    Cataract 2019   bilateral eyes   Childhood asthma    as a child   Complication of anesthesia    "takes me a year to get over"   Contact dermatitis    atypical [name])   Controlled diabetes mellitus type 2 with complications ([place]) 87/86/7672   CAD    COPD (chronic obstructive pulmonary disease) (HCC)    Coronary artery disease    a.2015: CABG w/ LIMA-LAD, RIMA-OM, SVG-RCA b. Cath on 07/10/15 w/ patent LIMA-LAD and RIMA-OM. SVG-RCA occluded but collaterals present   Diverticulosis 2013   severe by CT and colonoscopy   Environmental allergies    improved as ages   Fracture of lateral malleolus of left ankle 08/29/2013   Headache    hasn't had migraines in years   Hepatic steatosis 06/2015   by [place]   Hx of migraines    Hypertension    Lactose intolerance 11/14/2018   Endorses for years - avoids dairy products   Lumbosacral radiculopathy at S1 03/2012   left, with spinal stenosis (MRI 04/2013) improved with TF ESI L5/S1 and S1/2 (Dalton Blauvelt)   OSA (obstructive sleep apnea) 09/04/2014   Severe with AHI 35/hr, uses BIPAP   Overweight (BMI 25.0-29.9) 01/26/2017   Pre-diabetes    Prediabetes 06/08/2014   CAD    Resistance to clopidogrel 2015   drug metabolism panel run - asked to scan   Spinal stenosis    LS1 nerve root impingement from bulging disc   Vitamin B12 deficiency    Zenker diverticulum     Past Surgical History:  Procedure Laterality Date   ABDOMINAL AORTIC ANEURYSM REPAIR  03/25/12   endovascular   ABI  05/2013   WNL, L TBI low at 0.66   Bladder cancer  March 2010 and Oct. 2015   Ernst Spell) x 2   CARDIAC  CATHETERIZATION N/A 07/10/2015   Procedure: Left Heart Cath and Cors/Grafts Angiography;  Surgeon: [name], MD;  Location: [facility];  Service: Cardiovascular;  Laterality: N/A;   CATARACT EXTRACTION W/ INTRAOCULAR LENS IMPLANT Bilateral 07/2018, 08/2018   COLONOSCOPY  06/2012   hyperplastic polyp, diverticulosis ([place]) rec rpt 5 yrs   COLONOSCOPY WITH PROPOFOL N/A 08/10/2017   TA, rpt 5 yrs Ardis Hughs, Melene Plan, MD)   CORONARY ARTERY BYPASS GRAFT N/A 02/07/2014   Procedure: CORONARY ARTERY BYPASS GRAFTING (CABG);  Surgeon: [name], MD;  Location: [facility];  Service: Open Heart Surgery;  Laterality: N/A;  CABG X 3, BILATERAL LIMA, EVH   CYSTOSCOPY  08/16/08   Bladder Cancer   EPIDURAL BLOCK INJECTION Left 09/2014, 10/2014, 12/2014   medial L2,3,4, dorsal L5 ramus blocks x2, L L5/S1 and S1/2 transforaminal ESI (Dalton Bethea)   ESI  04/2013, 06/2013   L L5S1, S12 transforaminal ESI (Dr.  [name])   ESI Left 04/2014, 05/2014, 06/2014   L5/S1, S1/2; rpt; L4/5   ESI  03/2016   R L5/S1 interlaminar ESI   ESOPHAGEAL MANOMETRY N/A 08/10/2017   Procedure: ESOPHAGEAL MANOMETRY (EM);  Surgeon: [name], MD;  Location: [facility];  Service: Endoscopy;  Laterality: N/A;   ESOPHAGOGASTRODUODENOSCOPY (EGD) WITH PROPOFOL N/A 08/10/2017   Procedure: ESOPHAGOGASTRODUODENOSCOPY (EGD) WITH PROPOFOL;  Surgeon: [name], MD;  Location: [facility];  Service: Endoscopy;  Laterality: N/A;   FULGURATION OF BLADDER TUMOR  01/2017   recurrent 16mm L lateral wall papillary transitional cell carcinoma ([place])   INTRAOPERATIVE TRANSESOPHAGEAL ECHOCARDIOGRAM N/A 02/07/2014  Procedure: INTRAOPERATIVE TRANSESOPHAGEAL ECHOCARDIOGRAM;  Surgeon: [name], MD;  Location: [facility];  Service: Open Heart Surgery;  Laterality: N/A;   LEFT HEART CATH AND CORS/GRAFTS ANGIOGRAPHY N/A 02/11/2017   Procedure: LEFT HEART CATH AND CORS/GRAFTS ANGIOGRAPHY;  Surgeon:  [name], MD;  Location: [facility];  Service: Cardiovascular;  Laterality: N/A;   LEFT HEART CATHETERIZATION WITH CORONARY ANGIOGRAM N/A 02/01/2014   Procedure: LEFT HEART CATHETERIZATION WITH CORONARY ANGIOGRAM;  Surgeon: [name], MD;  Location: [facility];  Service: Cardiovascular;  Laterality: N/A;   PRP epidural injection Left 11/2015   L5/S1, S1/2 transforaminal epidural PRP injections under fluoroscopy ([place]-B[place]a)   REPLACEMENT TOTAL HIP W/  RESURFACING IMPLANTS Left    TONSILLECTOMY AND ADENOIDECTOMY  1973   TOTAL HIP ARTHROPLASTY Left 03/08/2017   TOTAL HIP ARTHROPLASTY Left 03/08/2017   Procedure: LEFT TOTAL HIP ARTHROPLASTY ANTERIOR APPROACH;  Surgeon: [name], MD;  Location: [facility];  Service: Orthopedics;  Laterality: Left;   VASECTOMY     ZENKER'S DIVERTICULECTOMY  09/2017    Family History  Problem Relation Age of Onset   Diabetes Mother    Arrhythmia Mother        pacemaker   COPD Mother    Thyroid disease Mother    Hyperlipidemia Mother    Hypertension Mother    Bladder Cancer Mother    Diabetes Father    CAD Father 20       CHF, MI   Dementia Father    Heart attack Father    Diabetes Sister    Heart attack Maternal Grandmother    AAA (abdominal aortic aneurysm) Maternal Grandmother    Colon cancer Neg Hx    Stomach cancer Neg Hx     Social History   Socioeconomic History   Marital status: Married    Spouse name: Not on file   Number of children: 2   Years of education: Not on file   Highest education level: Not on file  Occupational History   Occupation: Self Employed  Social Designer, fashion/clothing strain: Not on file   Food insecurity    Worry: Not on file    Inability: Not on file   Transportation needs    Medical: Not on file    Non-medical: Not on file  Tobacco Use   Smoking status: Former Smoker    Packs/day: 1.00    Years: 40.00    Pack years: 40.00     Types: Cigarettes    Quit date: 02/05/2014    Years since quitting: 4.9   Smokeless tobacco: Never Used  Substance and Sexual Activity   Alcohol use: Yes    Alcohol/week: 0.0 standard drinks    Comment: 3-4 drinks per month   Drug use: No   Sexual activity: Not Currently  Social History Narrative   Caffeine: 1 cup coffee/day   Lives with wife, 1 dog   grown children   Occupation: Sports administrator scrap/salvage - self employed.  Disability after CABG   Edu: 2 yrs college   Activity: fishing, walking occasionally   Diet: good water, fruits/vegetables daily    Social History   Tobacco Use  Smoking Status Former Smoker   Packs/day: 1.00   Years: 40.00   Pack years: 40.00   Types: Cigarettes   Quit date: 02/05/2014   Years since quitting: 4.9  Smokeless Tobacco Never Used    Social History   Substance and Sexual Activity  Alcohol Use Yes  Alcohol/week: 0.0 standard drinks   Comment: 3-4 drinks per month     Allergies  Allergen Reactions   Codeine Other (See Comments)    Nightmares   Doxycycline Hives and Other (See Comments)    mild    Current Outpatient Medications  Medication Sig Dispense Refill   acetaminophen (TYLENOL) 325 MG tablet Take 2 tablets (650 mg total) by mouth every 6 (six) hours as needed for moderate pain or headache.     albuterol (PROAIR HFA) 108 (90 Base) MCG/ACT inhaler Inhale 2 puffs into the lungs every 4 (four) hours as needed for wheezing or shortness of breath. 8.5 Inhaler 2   albuterol (PROVENTIL) (2.5 MG/3ML) 0.083% nebulizer solution Take 3 mLs (2.5 mg total) by nebulization every 6 (six) hours as needed for wheezing or shortness of breath. 75 mL 12   amLODipine (NORVASC) 5 MG tablet TAKE 1 TABLET BY MOUTH EVERY DAY 90 tablet 3   aspirin EC 325 MG EC tablet Take 1 tablet (325 mg total) by mouth daily.     atorvastatin (LIPITOR) 20 MG tablet TAKE 1 TABLET (20 MG TOTAL) BY MOUTH DAILY AT 6 PM. 90 tablet 3   famotidine (PEPCID)  20 MG tablet TAKE 1 TABLET (20 MG TOTAL) BY MOUTH 2 (TWO) TIMES DAILY. 180 tablet 1   Fluticasone-Umeclidin-Vilant (TRELEGY ELLIPTA) 100-62.5-25 MCG/INH AEPB Inhale 1 puff into the lungs daily. 180 each 0   gabapentin (NEURONTIN) 300 MG capsule Take 1 capsule (300 mg total) by mouth at bedtime. First 3 days take once nightly 30 capsule 11   glucose blood (ONE TOUCH ULTRA TEST) test strip CHECK ONCE DAILY AND AS NEEDED     HYDROcodone-acetaminophen (NORCO/VICODIN) 5-325 MG tablet Take 1 tablet by mouth every 8 (eight) hours as needed for up to 5 days for moderate pain or severe pain. 15 tablet 0   ipratropium-albuterol (DUONEB) 0.5-2.5 (3) MG/3ML SOLN TAKE 3 MLS BY NEBULIZATION EVERY 6 (SIX) HOURS AS NEEDED FOR UP TO 30 DAYS. 360 mL 0   metoprolol tartrate (LOPRESSOR) 25 MG tablet Take 0.5 tablets (12.5 mg total) by mouth 2 (two) times daily. 90 tablet 3   tamsulosin (FLOMAX) 0.4 MG CAPS capsule Take 1 capsule (0.4 mg total) by mouth daily after supper.     vitamin B-12 (CYANOCOBALAMIN) 500 MCG tablet Take 1 tablet (500 mcg total) by mouth every other day.     No current facility-administered medications for this visit.      Review of Systems:     Cardiac Review of Systems: Y or N  Chest Pain [  n  ]  Resting SOB [n   ] Exertional SOB  [n  ]  Orthopnea [ n ]   Pedal Edema [n   ]    Palpitations Florencio.Farrier ] Syncope  [ n ]   Presyncope [ n  ]  General Review of Systems: [Y] = yes [  ]=no Constitional: recent weight change [ n ];  Wt loss over the last 3 months [   ] anorexia [  ]; fatigue [  ]; nausea [  ]; night sweats [  ]; fever [  ]; or chills [  ];          Dental: poor dentition[  ]; Last Dentist visit:   Eye : blurred vision [  ]; diplopia [   ]; vision changes [  ];  Amaurosis fugax[  ]; Resp: cough [  ];  wheezing[y  ];  hemoptysis[  ];  shortness of breath[  ]; paroxysmal nocturnal dyspnea[  ]; dyspnea on exertion[  ]; or orthopnea[  ];  GI:  gallstones[n  ], vomiting[ n ];  dysphagia[  y ]; melena[  ];  hematochezia [  ]; heartburn[  ];   Hx of  Colonoscopy[ y ]; GU: kidney stones [  ]; hematuria[  ];   dysuria [  ];  nocturia[  ];  history of     obstruction [  ]; urinary frequency [  ]             Skin: rash, swelling[  ];, hair loss[  ];  peripheral edema[  ];  or itching[  ]; Musculosketetal: myalgias[  ];  joint swelling[  ];  joint erythema[  ];  joint pain[  ];  back pain[  ];  Heme/Lymph: bruising[  ];  bleeding[  ];  anemia[  ];  Neuro: TIA[  ];  headaches[  ];  stroke[  ];  vertigo[  ];  seizures[  ];   paresthesias[  ];  difficulty walking[  ];  Psych:depression[  ]; anxiety[  ];  Endocrine: diabetes[  ];  thyroid dysfunction[  ];  Immunizations: Flu up to date [ Y ]; Pneumococcal up to date Jazmín.Cullens  ];  Other:  Physical Exam: BP (!) 151/92 (BP Location: Right Arm, Patient Position: Sitting, Cuff Size: Large)    Pulse 98 Comment: thermal   Resp 18    Ht 5' 9.5" (1.765 m)    Wt 215 lb (97.5 kg)    SpO2 94% Comment: RA   BMI 31.29 kg/m   PHYSICAL EXAMINATION:  General appearance: alert, cooperative and appears older than stated age Neurologic: intact Heart: regular rate and rhythm, S1, S2 normal, no murmur, click, rub or gallop Lungs: clear to auscultation bilaterally Abdomen: soft, non-tender; bowel sounds normal; no masses,  no organomegaly Extremities: extremities normal, atraumatic, no cyanosis or edema and Homans sign is negative, no sign of DVT Wound: Sternal incision is well-healed Patient has no cervical or supraclavicular adenopathy no neck tenderness, on digital exam of the mouth there are no palpable masses appreciated on the tongue, visual exam of the oral cavity reveals no abnormality  Diagnostic Studies & Laboratory data:     Recent Radiology Findings:  Dg Esophagus W Double Cm (hd)  Result Date: 12/12/2018 CLINICAL DATA:  Esophageal diverticulum repair 2019. Fullness within submandibular LEFT neck. Pain with swallowing EXAM: ESOPHOGRAM/BARIUM  SWALLOW TECHNIQUE: Single contrast examination was performed using  thin barium. FLUOROSCOPY TIME:  Fluoroscopy Time:  1.2 minute Radiation Exposure Index (if provided by the fluoroscopic device): 40.9 mGy Number of Acquired Spot Images: 7 COMPARISON:  Esophagram 04/20/2016 FINDINGS: Rapid sequence evaluation of the proximal esophagus demonstrates at posterior diverticulum measuring approximately 2.5 cm in height and 0.8 cm in depth positioned at the C5-C6 vertebral body level. The diverticulum retains contrast after swallowing the barium bolus. No stricture or mass. Distal esophagus is normal. IMPRESSION: Relatively large diverticulum posterior to the cervical esophagus (Zinkers diverticulum). Electronically Signed   By: Suzy Bouchard M.D.   On: 12/12/2018 11:18     Dg Chest 2 View  04/16/2014   CLINICAL DATA:  Coronary artery disease. COPD and hypertension. Former smoker.  EXAM: CHEST  2 VIEW  COMPARISON:  03/19/2014  FINDINGS: Sternotomy wires are unchanged. Lungs are adequately inflated without consolidation or effusion. There is flattening of the hemidiaphragms on the lateral film. Cardiomediastinal silhouette and remainder of the exam is unchanged.  IMPRESSION: No active cardiopulmonary disease.   Electronically Signed   By: Marin Olp M.D.   On: 04/16/2014 10:20   Dg Esophagus  04/25/2014   CLINICAL DATA:  Difficulty swallowing pills and food. Choking sensation mild eating. Reflux. Cough.  EXAM: ESOPHOGRAM / BARIUM SWALLOW / BARIUM TABLET STUDY  TECHNIQUE: Combined double contrast and single contrast examination performed using effervescent crystals, thick barium liquid, and thin barium liquid. The patient was observed with fluoroscopy swallowing a 2mm barium sulphate tablet.  FLUOROSCOPY TIME:  1 min, 26 seconds  COMPARISON:  None.  FINDINGS: Pharyngeal phase of swallowing reveals a small Zenker' s diverticulum as on image 9 series 2.  Occasional tertiary contractions noted in the esophagus.  Primary peristaltic waves in the esophagus uninterrupted on 3 out of 4 swallows, within normal limits.  No esophageal ulceration or significant constriction identified. A 13 mm barium tablet passed briskly to the stomach, but the 80 listed the patient has feelings of globus sensation for up to 30 seconds afterwards.  IMPRESSION: 1. Small  diverticulum. 2. The patient experienced globus sensation after swallowing the barium pill, even though the barium pill passed briskly into the stomach. No esophageal stricture identified.   Electronically Signed   By: Sherryl Barters M.D.   On: 04/25/2014 14:13        Recent Lab Findings: Lab Results  Component Value Date   WBC 8.9 11/10/2018   HGB 16.4 11/10/2018   HCT 46.7 11/10/2018   PLT 274 11/10/2018   GLUCOSE 113 (H) 11/10/2018   CHOL 115 07/13/2018   TRIG 115.0 07/13/2018   HDL 46.50 07/13/2018   LDLDIRECT 62.0 12/28/2017   LDLCALC 46 07/13/2018   ALT 9 11/10/2018   AST 13 11/10/2018   NA 141 11/10/2018   K 4.0 11/10/2018   CL 106 11/10/2018   CREATININE 0.82 11/10/2018   BUN 15 11/10/2018   CO2 28 11/10/2018   TSH 2.20 07/13/2018   INR 1.1 02/10/2017   HGBA1C 6.5 (A) 11/08/2018      Assessment / Plan:  1/  Recurrent zenkers-discussed with the patient resection of the Zenker's surgically.  Will have patient evaluated by ENT for full ENT exam prior to surgery.  The patient has upper neck pain that he relates is independent of his swallowing difficulties.  #2 cardiac clearance prior to surgical resection of his Zenker's  We will plan to see him back in the office after the above are completed and further discuss surgical resection of what appears to be recurrent Zenker's.  Grace Isaac MD      [address] [place] [address] Office [phone]   [place]  01/03/2019 10:00 PM

## 2019-01-03 NOTE — Telephone Encounter (Signed)
[facility]

## 2019-01-03 NOTE — Telephone Encounter (Signed)
Can you ask him what works?

## 2019-01-03 NOTE — Telephone Encounter (Signed)
[place], done

## 2019-01-03 NOTE — Telephone Encounter (Signed)
Left message #1

## 2019-01-09 DIAGNOSIS — J449 Chronic obstructive pulmonary disease, unspecified: Secondary | ICD-10-CM | POA: Diagnosis not present

## 2019-01-09 DIAGNOSIS — G4733 Obstructive sleep apnea (adult) (pediatric): Secondary | ICD-10-CM | POA: Diagnosis not present

## 2019-01-09 DIAGNOSIS — R269 Unspecified abnormalities of gait and mobility: Secondary | ICD-10-CM | POA: Diagnosis not present

## 2019-01-15 DIAGNOSIS — R1314 Dysphagia, pharyngoesophageal phase: Secondary | ICD-10-CM | POA: Diagnosis not present

## 2019-01-15 DIAGNOSIS — K225 Diverticulum of esophagus, acquired: Secondary | ICD-10-CM | POA: Diagnosis not present

## 2019-01-15 DIAGNOSIS — J029 Acute pharyngitis, unspecified: Secondary | ICD-10-CM | POA: Diagnosis not present

## 2019-01-22 ENCOUNTER — Telehealth: Payer: Self-pay

## 2019-01-22 ENCOUNTER — Ambulatory Visit: Payer: Self-pay

## 2019-01-22 ENCOUNTER — Other Ambulatory Visit: Payer: Self-pay

## 2019-01-22 ENCOUNTER — Ambulatory Visit (INDEPENDENT_AMBULATORY_CARE_PROVIDER_SITE_OTHER): Payer: PPO | Admitting: Physical Medicine and Rehabilitation

## 2019-01-22 ENCOUNTER — Encounter: Payer: Self-pay | Admitting: Physical Medicine and Rehabilitation

## 2019-01-22 VITALS — BP 127/81 | HR 79

## 2019-01-22 DIAGNOSIS — M545 Low back pain: Secondary | ICD-10-CM | POA: Diagnosis not present

## 2019-01-22 DIAGNOSIS — M5416 Radiculopathy, lumbar region: Secondary | ICD-10-CM | POA: Diagnosis not present

## 2019-01-22 DIAGNOSIS — M47816 Spondylosis without myelopathy or radiculopathy, lumbar region: Secondary | ICD-10-CM

## 2019-01-22 DIAGNOSIS — M7062 Trochanteric bursitis, left hip: Secondary | ICD-10-CM

## 2019-01-22 DIAGNOSIS — Z96642 Presence of left artificial hip joint: Secondary | ICD-10-CM | POA: Diagnosis not present

## 2019-01-22 DIAGNOSIS — G8929 Other chronic pain: Secondary | ICD-10-CM | POA: Diagnosis not present

## 2019-01-22 MED ORDER — BETAMETHASONE SOD PHOS & ACET 6 (3-3) MG/ML IJ SUSP: 12.0000 mg | Freq: Once | INTRAMUSCULAR | Status: DC

## 2019-01-22 NOTE — Telephone Encounter (Signed)
Patient will be seen in the office. Patient answers no to all screening questions below. Patient understands that there are no visitors allowed. Patient understands not to arrive more than 15 minutes prior to the scheduled appointment time and that a mask will be required for the visit.      COVID-19 Pre-Screening Questions:  . In the past 7 to 10 days have you had a cough,  shortness of breath, headache, congestion, fever (100 or greater) body aches, chills, sore throat, or sudden loss of taste or sense of smell? NO . Have you been around anyone with known Covid 19? NO . Have you been around anyone who is awaiting Covid 19 test results in the past 7 to 10 days? NO . Have you been around anyone who has been exposed to Covid 19, or has mentioned symptoms of Covid 19 within the past 7 to 10 days? NO

## 2019-01-22 NOTE — Progress Notes (Signed)
Numeric Pain Rating Scale and Functional Assessment Average Pain 9   In the last MONTH (on 0-10 scale) has pain interfered with the following?  1. General activity like being  able to carry out your everyday physical activities such as walking, climbing stairs, carrying groceries, or moving a chair?  Rating(9) - able to tolerate activities with rest in between.   +Driver, -BT (asprin), -Dye Allergies.

## 2019-01-22 NOTE — Progress Notes (Signed)
Carl Lee - 63 y.o. male MRN [id number]  Date of birth: [birth year]  Office Visit Note: Visit Date: 01/22/2019 PCP: [name], MD Referred by: [name], MD  Subjective: Chief Complaint  Patient presents with   Lower Back - Injections   Left Hip - Injections   HPI:  Carl Lee is a 63 y.o. male who comes in today For evaluation management of 2 ongoing problems including left hip and leg pain as well as axial low back pain some referral to the right lateral hip.  By way of quick history and review the patient was originally seen by Dr. [name] for pain management and received back injections including PRP or stem cell treatment to his back and joints.  We started seeing him when she began not excepting commercial insurance and Medicare.  We diagnosed him originally with hip osteoarthritis on the left and he did well with intra-articular injection and ultimately saw Dr. [name] for left total hip replacement.  He continues to follow with Dr. [name] and his assistant [name], P.A.-C.  At the end of last winter patient was referred to [place] for lumbar injection do to the fact he continued to have left hip pain which we are referring from the buttock area to the greater trochanteric area and occasionally into the anterior thigh.  When I saw him at that time he had exquisite pain over the greater trochanter.  He has had greater trochanteric injections by [name], P.A.-C which the patient said did not really seem to help and so they assumed it was his back.  MRI imaging and imaging of his total hip replacement seem to look fine.  At that time I decided to complete greater trochanteric bursa injection with fluoroscopic guidance just to see if that would do any better based on clinical exam.  He actually got about a months of decent relief from that bursa injection which seemed to be diagnostic.  I spoke with [name] about this and the patient did return to see them.   About a month later I did see the patient and we did complete epidural injection but at that time he was having right-sided complaints not left-sided.  That injection was a right-sided epidural injection which gave him some benefit of his right-sided pain at the time.  Do to the coronavirus issues patient really has been lost to follow-up at least for several months.  He says is been hurting on the left side now from the buttock to the greater trochanter into the thigh.  Interestingly his pain is worse with hip flexion and rotation on his own.  He gets a lot of back pain that is axial with standing and turning.  He has no pain on the right side though past the upper hip area.  He does have some left-sided symptoms that seem to go down to the ankle but this biggest complaint is the left lateral hip pain.  Review of Systems  Musculoskeletal: Positive for back pain and joint pain.   Otherwise per HPI.  Assessment & Plan: Visit Diagnoses:  1. Greater trochanteric bursitis, left   2. Lumbar radiculopathy   3. Spondylosis without myelopathy or radiculopathy, lumbar region   4. Chronic bilateral low back pain without sciatica   5. History of total hip arthroplasty, left     Plan: Findings:  1.  Left buttock lateral hip and anterior thigh pain to about the knee and occasional pain past the knee.  This  is really multifactorial and hard to get a handle on.  MRI from 2017 does not show much in the way of a direct problem in the spine that would seemingly cause this.  It could be more of an L3 nerve related issue if it is from his spine.  He did have a left-sided disc herniation but it was at L5-S1 which would typically cause pain down the leg into the bottom of the foot which he does not have.  He has had that a few years ago.  He got good relief diagnostically with greater trochanteric injection with fluoroscopic guidance.  I think at this point the best plan is to repeat that greater trochanteric injection  with good recorded history of how he feels after that.  If it is tremendously better diagnostically but I think at that point he has to follow-up with Dr. [name] and see what can be done in that sense.  If he does not get much relief at all or its very fleeting I would go ahead and complete a left L5 versus L4 transforaminal injection or update MRI of the lumbar spine which was completed in 2017.  On the differential could be iliopsoas bursitis but I do not think that would cause the lateral pain.  Finally in terms of his back pain he does get some relief with using half of a 5 mg hydrocodone.  This is something is been a little bit of an issue with him he does typically want to have some level of small amount of opioid to help him functionally.  2.  In terms of his back pain I think this is completely different scenario and it is facet mediated low back pain.  I think what we will do is we will set him up for a couple weeks from now to complete diagnostic facet blocks at L4-5 and L5-S1 most likely.  If he gets good relief with that of his back pain and not necessarily hip and leg as we need to keep the separate then we could look at radiofrequency ablation for his back pain.  We continue to talk about activity modification and exercise.    Meds & Orders:  Meds ordered this encounter  Medications   betamethasone acetate-betamethasone sodium phosphate (CELESTONE) injection 12 mg    Orders Placed This Encounter  Procedures   Large Joint Inj: L greater trochanter   XR C-ARM NO REPORT   Epidural Steroid injection    Follow-up: Return if symptoms worsen or fail to improve, for See plan above.   Procedures: Large Joint Inj: L greater trochanter on 01/22/2019 2:09 PM Indications: pain and diagnostic evaluation Details: 22 G 3.5 in needle, fluoroscopy-guided lateral approach  Arthrogram: No  Medications: 4 mL bupivacaine 0.25 %; 80 mg triamcinolone acetonide 40 MG/ML Outcome: tolerated well,  no immediate complications  There was excellent flow of contrast outlined the greater trochanteric bursa without vascular uptake. Procedure, treatment alternatives, risks and benefits explained, specific risks discussed. Consent was given by the patient. Immediately prior to procedure a time out was called to verify the correct patient, procedure, equipment, support staff and site/side marked as required. Patient was prepped and draped in the usual sterile fashion.      No notes on file   Clinical History: MRI LUMBAR SPINE WITHOUT CONTRAST  TECHNIQUE: Multiplanar, multisequence MR imaging of the lumbar spine was performed. No intravenous contrast was administered.  COMPARISON:  None.  FINDINGS: Severe susceptibility artifact resulting from the aorto bi-iliac  stent graft which obscures portions of the lumbar spine suspect she on the STIR images.  Segmentation:  Standard.  Alignment:  Physiologic.  Vertebrae: Limited evaluation secondary to cystitis did of the artifact. No fracture, evidence of discitis, or bone lesion.  Conus medullaris: Extends to the L1 level and appears normal.  Paraspinal and other soft tissues: Severe susceptibility artifact resulting from the aorto bi-iliac stent graft .  Disc levels:  Disc spaces: Disc heights are relatively well maintained, but evaluation is limited.  T12-L1: No significant disc protrusion. No evidence of neural foraminal stenosis. No central canal stenosis.  L1-L2: Mild broad-based disc bulge. Mild bilateral facet arthropathy. No evidence of neural foraminal stenosis. No central canal stenosis.  L2-L3: Small central disc protrusion. Mild bilateral facet arthropathy. No evidence of neural foraminal stenosis. No central canal stenosis.  L3-L4: Moderate broad-based disc bulge flattening the ventral thecal sac. Mild bilateral facet arthropathy. Mild spinal stenosis. No foraminal stenosis.  L4-L5: Moderate  broad-based disc bulge eccentric towards the right. Moderate bilateral facet arthropathy. Mild -moderate spinal stenosis. No evidence of neural foraminal stenosis.  L5-S1: Broad-based disc bulge with a left paracentral disc protrusion abutting the left intraspinal S1 nerve root. No evidence of neural foraminal stenosis. No central canal stenosis.  IMPRESSION: 1. Lumbar spine spondylosis as described above. Limitations as described above secondary to susceptibility artifact resulting from the aortic stent graft. 2. At L4-5 there is a moderate broad-based disc bulge eccentric towards the right. Moderate bilateral facet arthropathy. Mild -moderate spinal stenosis. 3. At L5-S1 there is a broad-based disc bulge with a left paracentral disc protrusion abutting the left intraspinal S1 nerve root.   Electronically Signed   By: Kathreen Devoid   On: 11/27/2015 12:13     Objective:  VS:  HT:     WT:    BMI:      BP:127/81   HR:79bpm   TEMP: ( )   RESP:94 % Physical Exam Vitals signs and nursing note reviewed.  Constitutional:      General: He is not in acute distress.    Appearance: He is well-developed. He is obese.  HENT:     Head: Normocephalic and atraumatic.     Nose: Nose normal.     Mouth/Throat:     Mouth: Mucous membranes are moist.     Pharynx: Oropharynx is clear.  Eyes:     Conjunctiva/sclera: Conjunctivae normal.     Pupils: Pupils are equal, round, and reactive to light.  Neck:     Musculoskeletal: Normal range of motion and neck supple.     Trachea: No tracheal deviation.  Cardiovascular:     Rate and Rhythm: Normal rate and regular rhythm.     Pulses: Normal pulses.  Pulmonary:     Effort: Pulmonary effort is normal.     Breath sounds: Normal breath sounds.  Abdominal:     General: There is no distension.     Palpations: Abdomen is soft.     Tenderness: There is no guarding or rebound.  Musculoskeletal:        General: No deformity.     Right lower leg:  No edema.     Left lower leg: No edema.     Comments: Patient has exquisite tenderness over the left greater trochanter that does reproduce a lot of his pain at least in the general area.  He also has some pain with hip flexion and rotation on that side but no exquisite groin pain.  Patient  has back pain with extension and facet loading.  He has a negative slump test.  He has good distal strength without clonus.  Skin:    General: Skin is warm and dry.     Findings: No erythema or rash.     Comments: Significantly tanned arms  Neurological:     General: No focal deficit present.     Mental Status: He is alert and oriented to person, place, and time.     Sensory: No sensory deficit.     Motor: No weakness or abnormal muscle tone.     Coordination: Coordination normal.     Gait: Gait abnormal.  Psychiatric:        Mood and Affect: Mood normal.        Behavior: Behavior normal.        Thought Content: Thought content normal.     Ortho Exam Imaging: Xr C-arm No Report  Result Date: 01/22/2019 Please see Notes tab for imaging impression.

## 2019-01-23 ENCOUNTER — Encounter: Payer: Self-pay | Admitting: Physician Assistant

## 2019-01-23 ENCOUNTER — Encounter: Payer: Self-pay | Admitting: Physical Medicine and Rehabilitation

## 2019-01-23 ENCOUNTER — Ambulatory Visit: Payer: PPO | Admitting: Physician Assistant

## 2019-01-23 VITALS — BP 114/74 | HR 90 | Ht 69.5 in | Wt 212.8 lb

## 2019-01-23 DIAGNOSIS — E785 Hyperlipidemia, unspecified: Secondary | ICD-10-CM

## 2019-01-23 DIAGNOSIS — I25810 Atherosclerosis of coronary artery bypass graft(s) without angina pectoris: Secondary | ICD-10-CM

## 2019-01-23 DIAGNOSIS — Z01818 Encounter for other preprocedural examination: Secondary | ICD-10-CM

## 2019-01-23 DIAGNOSIS — I10 Essential (primary) hypertension: Secondary | ICD-10-CM

## 2019-01-23 DIAGNOSIS — Z95828 Presence of other vascular implants and grafts: Secondary | ICD-10-CM

## 2019-01-23 MED ORDER — TRIAMCINOLONE ACETONIDE 40 MG/ML IJ SUSP
80.0000 mg | INTRAMUSCULAR | Status: AC | PRN
  Administered 2019-01-22: 80 mg via INTRA_ARTICULAR

## 2019-01-23 MED ORDER — BUPIVACAINE HCL 0.25 % IJ SOLN
4.0000 mL | INTRAMUSCULAR | Status: AC | PRN
  Administered 2019-01-22: 14:00:00 4 mL via INTRA_ARTICULAR

## 2019-01-23 MED ORDER — ASPIRIN EC 81 MG PO TBEC: 81.0000 mg | DELAYED_RELEASE_TABLET | Freq: Every day | ORAL | 3 refills | Status: DC

## 2019-01-23 NOTE — H&P (View-Only) (Signed)
Cardiology Office Note    Date:  01/23/2019   ID:  Markevious, Ehmke 1955/10/15, MRN [id number]  PCP:  [name], MD  Cardiologist: [name], MD EPS: None  No chief complaint on file.   History of Present Illness:  Carl Lee is a 63 y.o. male CAD s/p CABG in 2015 with Dr. [name] (L-LAD, RIMA-OM, S-PDA) AAA s/p endovascular repair in 9/13 by Dr. [name], HTN, HL, DM2, OSA on CPAP, tobacco abuse-quit 3 months ago, recurrent bladder CA, carotid disease.   cath 02/11/17 for preop right hip surgery clearance. Stable with patent LIMA to mid LAD, RIMA to OM2 Occluded SVG PDA native RCA fills bridging collaterals EF 45-50% EF was normal by TTE done 11/11/15, residual AAA 4.0 cm 2017.   Last saw Dr. [name] 09/12/18 with multiple types of chest pain intolerant to Imdur. On ranexa and capzasin cream to chest for chest wall pain. Didn't think he needed a heart cath. Chronic dyspnea felt secondary to asthmatic COPD followed by Dr. [name].  Patient here today for preop clearance for Zenker's Diverticulum by Dr. Servando Snare.  Patient has chest pressure and short of breath when walking up the stairs -sometimes has to stop to catch his breath. Also incisional pain when he coughs or sneezes. Push mows his yard and gets chest tightness but he doesn't stop and it eventually eases.      Past Medical History:  Diagnosis Date  . AAA (abdominal aortic aneurysm) ([place]) 03/25/12   s/p endovascular repair  . Adenomatous polyp 06/2009  . Atrial tachycardia, paroxysmal ([place]) 06/18/2016  . Bladder cancer ([place]) 08/16/2008   recurrence 2015 and 2018 treated with office fulguration  . CAD (coronary artery disease), native coronary artery    S/p 3v CABG 01/2014   . Cataract 2019   bilateral eyes  . Childhood asthma    as a child  . Complication of anesthesia    "takes me a year to get over"  . Contact dermatitis    atypical [name])  . Controlled diabetes mellitus type 2 with complications  ([place]) 16/01/3709   CAD   . COPD (chronic obstructive pulmonary disease) ([place])   . Coronary artery disease    a.2015: CABG w/ LIMA-LAD, RIMA-OM, SVG-RCA b. Cath on 07/10/15 w/ patent LIMA-LAD and RIMA-OM. SVG-RCA occluded but collaterals present  . Diverticulosis 2013   severe by CT and colonoscopy  . Environmental allergies    improved as ages  . Fracture of lateral malleolus of left ankle 08/29/2013  . Headache    hasn't had migraines in years  . Hepatic steatosis 06/2015   by [place]  . Hx of migraines   . Hypertension   . Lactose intolerance 11/14/2018   Endorses for years - avoids dairy products  . Lumbosacral radiculopathy at S1 03/2012   left, with spinal stenosis (MRI 04/2013) improved with TF ESI L5/S1 and S1/2 ([place])  . OSA (obstructive sleep apnea) 09/04/2014   Severe with AHI 35/hr, uses BIPAP  . Overweight (BMI 25.0-29.9) 01/26/2017  . Pre-diabetes   . Prediabetes 06/08/2014   CAD   . Resistance to clopidogrel 2015   drug metabolism panel run - asked to scan  . Spinal stenosis    LS1 nerve root impingement from bulging disc  . Vitamin B12 deficiency   . Zenker diverticulum     Past Surgical History:  Procedure Laterality Date  . ABDOMINAL AORTIC ANEURYSM REPAIR  03/25/12   endovascular  . ABI  05/2013  WNL, L TBI low at 0.66  . Bladder cancer  March 2010 and Oct. 2015   Ernst Spell) x 2  . CARDIAC CATHETERIZATION N/A 07/10/2015   Procedure: Left Heart Cath and Cors/Grafts Angiography;  Surgeon: [name], MD;  Location: [facility];  Service: Cardiovascular;  Laterality: N/A;  . CATARACT EXTRACTION W/ INTRAOCULAR LENS IMPLANT Bilateral 07/2018, 08/2018  . COLONOSCOPY  06/2012   hyperplastic polyp, diverticulosis ([place]) rec rpt 5 yrs  . COLONOSCOPY WITH PROPOFOL N/A 08/10/2017   TA, rpt 5 yrs Ardis Hughs, Melene Plan, MD)  . CORONARY ARTERY BYPASS GRAFT N/A 02/07/2014   Procedure: CORONARY ARTERY BYPASS GRAFTING (CABG);  Surgeon: [name], MD;  Location: [facility];  Service: Open Heart Surgery;  Laterality: N/A;  CABG X 3, BILATERAL LIMA, EVH  . CYSTOSCOPY  08/16/08   Bladder Cancer  . EPIDURAL BLOCK INJECTION Left 09/2014, 10/2014, 12/2014   medial L2,3,4, dorsal L5 ramus blocks x2, L L5/S1 and S1/2 transforaminal ESI ([place])  . ESI  04/2013, 06/2013   L L5S1, S12 transforaminal ESI (Dr.  [name])  . ESI Left 04/2014, 05/2014, 06/2014   L5/S1, S1/2; rpt; L4/5  . ESI  03/2016   R L5/S1 interlaminar ESI  . ESOPHAGEAL MANOMETRY N/A 08/10/2017   Procedure: ESOPHAGEAL MANOMETRY (EM);  Surgeon: [name], MD;  Location: [facility];  Service: Endoscopy;  Laterality: N/A;  . ESOPHAGOGASTRODUODENOSCOPY (EGD) WITH PROPOFOL N/A 08/10/2017   Procedure: ESOPHAGOGASTRODUODENOSCOPY (EGD) WITH PROPOFOL;  Surgeon: [name], MD;  Location: [facility];  Service: Endoscopy;  Laterality: N/A;  . FULGURATION OF BLADDER TUMOR  01/2017   recurrent 63mm L lateral wall papillary transitional cell carcinoma ([place])  . INTRAOPERATIVE TRANSESOPHAGEAL ECHOCARDIOGRAM N/A 02/07/2014   Procedure: INTRAOPERATIVE TRANSESOPHAGEAL ECHOCARDIOGRAM;  Surgeon: [name], MD;  Location: [facility];  Service: Open Heart Surgery;  Laterality: N/A;  . LEFT HEART CATH AND CORS/GRAFTS ANGIOGRAPHY N/A 02/11/2017   Procedure: LEFT HEART CATH AND CORS/GRAFTS ANGIOGRAPHY;  Surgeon: [name], MD;  Location: [facility];  Service: Cardiovascular;  Laterality: N/A;  . LEFT HEART CATHETERIZATION WITH CORONARY ANGIOGRAM N/A 02/01/2014   Procedure: LEFT HEART CATHETERIZATION WITH CORONARY ANGIOGRAM;  Surgeon: [name], MD;  Location: [facility];  Service: Cardiovascular;  Laterality: N/A;  . PRP epidural injection Left 11/2015   L5/S1, S1/2 transforaminal epidural PRP injections under fluoroscopy ([place])  . REPLACEMENT TOTAL HIP W/  RESURFACING IMPLANTS Left   . TONSILLECTOMY AND ADENOIDECTOMY  1973  . TOTAL  HIP ARTHROPLASTY Left 03/08/2017  . TOTAL HIP ARTHROPLASTY Left 03/08/2017   Procedure: LEFT TOTAL HIP ARTHROPLASTY ANTERIOR APPROACH;  Surgeon: [name], MD;  Location: [facility];  Service: Orthopedics;  Laterality: Left;  [name] VASECTOMY    . ZENKER'S DIVERTICULECTOMY  09/2017    Current Medications: Current Meds  Medication Sig  . acetaminophen (TYLENOL) 325 MG tablet Take 2 tablets (650 mg total) by mouth every 6 (six) hours as needed for moderate pain or headache.  . albuterol (PROAIR HFA) 108 (90 Base) MCG/ACT inhaler Inhale 2 puffs into the lungs every 4 (four) hours as needed for wheezing or shortness of breath.  [name] albuterol (PROVENTIL) (2.5 MG/3ML) 0.083% nebulizer solution Take 3 mLs (2.5 mg total) by nebulization every 6 (six) hours as needed for wheezing or shortness of breath.  [name] amLODipine (NORVASC) 5 MG tablet TAKE 1 TABLET BY MOUTH EVERY DAY  . atorvastatin (LIPITOR) 20 MG tablet TAKE  1 TABLET (20 MG TOTAL) BY MOUTH DAILY AT 6 PM.  . famotidine (PEPCID) 20 MG tablet TAKE 1 TABLET (20 MG TOTAL) BY MOUTH 2 (TWO) TIMES DAILY.  [name] Fluticasone-Umeclidin-Vilant (TRELEGY ELLIPTA) 100-62.5-25 MCG/INH AEPB Inhale 1 puff into the lungs daily.  [name] gabapentin (NEURONTIN) 300 MG capsule Take 1 capsule (300 mg total) by mouth at bedtime. First 3 days take once nightly  . glucose blood (ONE TOUCH ULTRA TEST) test strip CHECK ONCE DAILY AND AS NEEDED  . ipratropium-albuterol (DUONEB) 0.5-2.5 (3) MG/3ML SOLN TAKE 3 MLS BY NEBULIZATION EVERY 6 (SIX) HOURS AS NEEDED FOR UP TO 30 DAYS.  [name] metoprolol tartrate (LOPRESSOR) 25 MG tablet Take 0.5 tablets (12.5 mg total) by mouth 2 (two) times daily.  . tamsulosin (FLOMAX) 0.4 MG CAPS capsule Take 1 capsule (0.4 mg total) by mouth daily after supper.  . vitamin B-12 (CYANOCOBALAMIN) 500 MCG tablet Take 1 tablet (500 mcg total) by mouth every other day.  . [DISCONTINUED] aspirin EC 325 MG EC tablet Take 1 tablet (325 mg total) by mouth daily.    Current Facility-Administered Medications for the 01/23/19 encounter (Office Visit) with Imogene Burn, PA-C  Medication  . betamethasone acetate-betamethasone sodium phosphate (CELESTONE) injection 12 mg     Allergies:   Codeine and Doxycycline   Social History   Socioeconomic History  . Marital status: Married    Spouse name: Not on file  . Number of children: 2  . Years of education: Not on file  . Highest education level: Not on file  Occupational History  . Occupation: Self Employed  Social Needs  . Financial resource strain: Not on file  . Food insecurity    Worry: Not on file    Inability: Not on file  . Transportation needs    Medical: Not on file    Non-medical: Not on file  Tobacco Use  . Smoking status: Former Smoker    Packs/day: 1.00    Years: 40.00    Pack years: 40.00    Types: Cigarettes    Quit date: 02/05/2014    Years since quitting: 4.9  . Smokeless tobacco: Never Used  Substance and Sexual Activity  . Alcohol use: Yes    Alcohol/week: 0.0 standard drinks    Comment: 3-4 drinks per month  . Drug use: No  . Sexual activity: Not Currently  Lifestyle  . Physical activity    Days per week: Not on file    Minutes per session: Not on file  . Stress: Not on file  Relationships  . Social Herbalist on phone: Not on file    Gets together: Not on file    Attends religious service: Not on file    Active member of club or organization: Not on file    Attends meetings of clubs or organizations: Not on file    Relationship status: Not on file  Other Topics Concern  . Not on file  Social History Narrative   Caffeine: 1 cup coffee/day   Lives with wife, 1 dog   grown children   Occupation: Therapist, sports - self employed.  Disability after CABG   Edu: 2 yrs college   Activity: fishing, walking occasionally   Diet: good water, fruits/vegetables daily     Family History:  The patient's   family history includes AAA (abdominal  aortic aneurysm) in his maternal grandmother; Arrhythmia in his mother; Bladder Cancer in his mother; CAD (age of onset: 12) in his  father; COPD in his mother; Dementia in his father; Diabetes in his father, mother, and sister; Heart attack in his father and maternal grandmother; Hyperlipidemia in his mother; Hypertension in his mother; Thyroid disease in his mother.   ROS:   Please see the history of present illness.    ROS All other systems reviewed and are negative.   PHYSICAL EXAM:   VS:  BP 114/74   Pulse 90   Ht 5' 9.5" (1.765 m)   Wt 212 lb 12.8 oz (96.5 kg)   SpO2 90%   BMI 30.97 kg/m   Physical Exam  GEN: Well nourished, well developed, in no acute distress  Neck: no JVD, carotid bruits, or masses Cardiac:RRR; no murmurs, rubs, or gallops  Respiratory:  clear to auscultation bilaterally, normal work of breathing GI: soft, nontender, nondistended, + BS Ext: without cyanosis, clubbing, or edema, Good distal pulses bilaterally Neuro:  Alert and Oriented x 3 Psych: euthymic mood, full affect  Wt Readings from Last 3 Encounters:  01/23/19 212 lb 12.8 oz (96.5 kg)  01/03/19 215 lb (97.5 kg)  11/14/18 218 lb (98.9 kg)      Studies/Labs Reviewed:   EKG:  EKG is  ordered today.  The ekg ordered today demonstrates NSR old septal infarct  Recent Labs: 07/13/2018: TSH 2.20 11/10/2018: ALT 9; Brain Natriuretic Peptide 87; BUN 15; Creat 0.82; Hemoglobin 16.4; Platelets 274; Potassium 4.0; Sodium 141   Lipid Panel    Component Value Date/Time   CHOL 115 07/13/2018 0851   CHOL 170 01/05/2012   TRIG 115.0 07/13/2018 0851   TRIG 141 01/05/2012   HDL 46.50 07/13/2018 0851   CHOLHDL 2 07/13/2018 0851   VLDL 23.0 07/13/2018 0851   LDLCALC 46 07/13/2018 0851   LDLDIRECT 62.0 12/28/2017 0827    Additional studies/ records that were reviewed today include:    NST 2018The left ventricular ejection fraction is normal (55-65%).  Nuclear stress EF: 55%.  There was no ST segment  deviation noted during stress.  This is a low risk study.   This study is most probably normal. However there are significant perfusion defects in both rest and stress images in the mid and apical anterior and septal walls that are not associated with wall motion abnormalities. These are most probably artifacts. Consider coronary CTA for further evaluation.       Holter Monitor:  03/25/16   SR average HR 72 Isolated PAC;s / PVC;s Some short runs 10-15 beats atrial tachycardia   Echo 11/11/15 Vigorous LVF, EF 65-70%, no RWMA, Gr 1 DD, reduced RVSF, PASP 24 mmHg   24 Hr BP Monitor 4/17 Avg BP in normal range   LHC 02/11/17 reviewed Conclusion       Prox RCA lesion, 100 %stenosed.  Origin to Prox Graft lesion, 100 %stenosed.  Ost LAD to Prox LAD lesion, 100 %stenosed.  LIMA and is normal in caliber.  3rd Mrg lesion, 40 %stenosed.  RIMA and is normal in caliber.  Ost Cx lesion, 55 %stenosed.  Prox Cx lesion, 20 %stenosed.  There is mild left ventricular systolic dysfunction.   Mild LV dysfunction with an ejection fraction of 45-50% with mild mid to basal inferior hypocontractility and a nipple appearing focal apical aneurysm.   Significant native CAD with total occlusion of the proximal LAD after a prominent septal perforating artery; 55% stenosis prior to an ectatic aneurysmal segment in the proximal circumflex 20% mild narrowing between the OM1 and OM 2 with retrograde filling into the  OM 2 via the [place] graft; and total occlusion of the proximal RCA with bridging collaterals to the mid RCA and collaterals arising from the distal circumflex and septal perforating artery of the LAD supplying the very distal branch vessels.   Patent LIMA graft supplying the mid LAD.   Patent RIMA graft supplying the OM 2 vessel of the circumflex   Old occluded vein graft, which had supplied the PDA region.   RECOMMENDATION: Medical therapy.  The patient will return to the care of Dr.  Roosvelt Harps in [place].  Based on his anatomy, which essentially is unchanged from 2 years ago,  cardiac clearance can be given for his hip surgery.                Carotid US (7/15):   Bilateral ICA 1-39%   ASSESSMENT:    1. Preoperative clearance   2. Coronary artery disease involving coronary bypass graft of native heart, angina presence unspecified   3. Essential hypertension   4. History of repair of aneurysm of abdominal aorta using endovascular stent graft   5. Hyperlipidemia, unspecified hyperlipidemia type      PLAN:  In order of problems listed above:  Peroperative clearance for Zenker's diverticulum-patient with daily chest pain and dyspnea on exertion and known occluded SVG. He doesn't think a stress test will be of any value with his prior blockage and he had a severe reaction with migraines after his last cath. Will order 2Decho and discuss further recommendations with Dr. Cathlean Sauer with Dr. [name] who recommends cardiac catheterization before clearing for surgery. Will contact patient. Says he developed severe migraine after last time he had a cath but I can't find documentation of that. Will verify with patient.  Addendum #2 I spoke with patient about Dr. Kyla Balzarine recommendation for cardiac cath before undergoing surgery. Patient is agreeable to proceed with cath. Will schedule for next week. In further speaking with patient he says the severe migraine he had with his first cath actually most likely came from NTG SL. He didn't have any trouble with his last cath. Will not need premedication. I have reviewed the risks, indications, and alternatives to angioplasty and stenting with the patient. Risks include but are not limited to bleeding, infection, vascular injury, stroke, myocardial infection, arrhythmia, kidney injury, radiation-related injury in the case of prolonged fluoroscopy use, emergency cardiac surgery, and death. The patient understands the risks of  serious complication is low (<5%) and patient agrees to proceed.    According to the Revised Cardiac Risk Index (RCRI), his Perioperative Risk of Major Cardiac Event is (%): 6.6  His Functional Capacity in METs is: 6.36 according to the [facility] Activity Status Index (DASI).    CAD S/P CABG 2015 cath for preop clearance 02/2017 patent LIMA to mLAD, RIMA to OM2, occluded SVG-PDA, native RCA fills bridging collaterals. EF45-50%, EF normal TTE 11/11/15.Decrease ASA to 81 mg daily.  Essential HTN BP well controlled  AAA endovascular repair 03/2012  HLD LDL 46 07/13/18  OSA on CPAP followed by Dr. Radford Pax      Medication Adjustments/Labs and Tests Ordered: Current medicines are reviewed at length with the patient today.  Concerns regarding medicines are outlined above.  Medication changes, Labs and Tests ordered today are listed in the Patient Instructions below. Patient Instructions  Medication Instructions:  Your physician has recommended you make the following change in your medication:   DECREASE: aspirin to 81 mg once a day  Lab work: None Ordered  If you have  labs (blood work) drawn today and your tests are completely normal, you will receive your results only by: [name] MyChart Message (if you have MyChart) OR . A paper copy in the mail If you have any lab test that is abnormal or we need to change your treatment, we will call you to review the results.  Testing/Procedures: Your physician has requested that you have an echocardiogram. Echocardiography is a painless test that uses sound waves to create images of your heart. It provides your doctor with information about the size and shape of your heart and how well your heart's chambers and valves are working. This procedure takes approximately one hour. There are no restrictions for this procedure.  Follow-Up: . Based on test results  Any Other Special Instructions Will Be Listed Below (If Applicable).       Sumner Boast, PA-C  01/23/2019 3:07 PM    [facility] [address], [place], [place]  [address] Phone: [phone]; Fax: [phone]

## 2019-01-23 NOTE — Progress Notes (Signed)
Dr. [name] recommends cath because of chest pain and complicated Zenker's surgery and won't clear him for surgery without cath first. Let me know if patient is agreeable and I can call him to discuss risks and benefits. Thanks, Ermalinda Barrios

## 2019-01-23 NOTE — Patient Instructions (Addendum)
Medication Instructions:  Your physician has recommended you make the following change in your medication:   DECREASE: aspirin to 81 mg once a day  Lab work: None Ordered  If you have labs (blood work) drawn today and your tests are completely normal, you will receive your results only by: [name] MyChart Message (if you have MyChart) OR . A paper copy in the mail If you have any lab test that is abnormal or we need to change your treatment, we will call you to review the results.  Testing/Procedures: Your physician has requested that you have an echocardiogram. Echocardiography is a painless test that uses sound waves to create images of your heart. It provides your doctor with information about the size and shape of your heart and how well your heart's chambers and valves are working. This procedure takes approximately one hour. There are no restrictions for this procedure.  Follow-Up: . Based on test results  Any Other Special Instructions Will Be Listed Below (If Applicable).

## 2019-01-23 NOTE — Progress Notes (Addendum)
Cardiology Office Note    Date:  01/23/2019   ID:  Lee, Carl 1956/05/09, MRN [id number]  PCP:  [name], MD  Cardiologist: [name], MD EPS: None  No chief complaint on file.   History of Present Illness:  Carl Lee is a 63 y.o. male CAD s/p CABG in 2015 with Dr. [name] (L-LAD, RIMA-OM, S-PDA) AAA s/p endovascular repair in 9/13 by Dr. [name], HTN, HL, DM2, OSA on CPAP, tobacco abuse-quit 3 months ago, recurrent bladder CA, carotid disease.   cath 02/11/17 for preop right hip surgery clearance. Stable with patent LIMA to mid LAD, RIMA to OM2 Occluded SVG PDA native RCA fills bridging collaterals EF 45-50% EF was normal by TTE done 11/11/15, residual AAA 4.0 cm 2017.   Last saw Dr. [name] 09/12/18 with multiple types of chest pain intolerant to Imdur. On ranexa and capzasin cream to chest for chest wall pain. Didn't think he needed a heart cath. Chronic dyspnea felt secondary to asthmatic COPD followed by Dr. [name].  Patient here today for preop clearance for Zenker's Diverticulum by Dr. Servando Snare.  Patient has chest pressure and short of breath when walking up the stairs -sometimes has to stop to catch his breath. Also incisional pain when he coughs or sneezes. Push mows his yard and gets chest tightness but he doesn't stop and it eventually eases.      Past Medical History:  Diagnosis Date  . AAA (abdominal aortic aneurysm) (Carl Lee) 03/25/12   s/p endovascular repair  . Adenomatous polyp 06/2009  . Atrial tachycardia, paroxysmal ([name]) 06/18/2016  . Bladder cancer (Carl Lee) 08/16/2008   recurrence 2015 and 2018 treated with office fulguration  . CAD (coronary artery disease), native coronary artery    S/p 3v CABG 01/2014   . Cataract 2019   bilateral eyes  . Childhood asthma    as a child  . Complication of anesthesia    "takes me a year to get over"  . Contact dermatitis    atypical [name])  . Controlled diabetes mellitus type 2 with complications  (Carl Lee) 14/43/1540   CAD   . COPD (chronic obstructive pulmonary disease) (Carl Lee)   . Coronary artery disease    a.2015: CABG w/ LIMA-LAD, RIMA-OM, SVG-RCA b. Cath on 07/10/15 w/ patent LIMA-LAD and RIMA-OM. SVG-RCA occluded but collaterals present  . Diverticulosis 2013   severe by CT and colonoscopy  . Environmental allergies    improved as ages  . Fracture of lateral malleolus of left ankle 08/29/2013  . Headache    hasn't had migraines in years  . Hepatic steatosis 06/2015   by [place]  . Hx of migraines   . Hypertension   . Lactose intolerance 11/14/2018   Endorses for years - avoids dairy products  . Lumbosacral radiculopathy at S1 03/2012   left, with spinal stenosis (MRI 04/2013) improved with TF ESI L5/S1 and S1/2 ([name])  . OSA (obstructive sleep apnea) 09/04/2014   Severe with AHI 35/hr, uses BIPAP  . Overweight (BMI 25.0-29.9) 01/26/2017  . Pre-diabetes   . Prediabetes 06/08/2014   CAD   . Resistance to clopidogrel 2015   drug metabolism panel run - asked to scan  . Spinal stenosis    LS1 nerve root impingement from bulging disc  . Vitamin B12 deficiency   . Zenker diverticulum     Past Surgical History:  Procedure Laterality Date  . ABDOMINAL AORTIC ANEURYSM REPAIR  03/25/12   endovascular  . ABI  05/2013  WNL, L TBI low at 0.66  . Bladder cancer  March 2010 and Oct. 2015   Ernst Spell) x 2  . CARDIAC CATHETERIZATION N/A 07/10/2015   Procedure: Left Heart Cath and Cors/Grafts Angiography;  Surgeon: [name], MD;  Location: [facility];  Service: Cardiovascular;  Laterality: N/A;  . CATARACT EXTRACTION W/ INTRAOCULAR LENS IMPLANT Bilateral 07/2018, 08/2018  . COLONOSCOPY  06/2012   hyperplastic polyp, diverticulosis (Carl Lee) rec rpt 5 yrs  . COLONOSCOPY WITH PROPOFOL N/A 08/10/2017   TA, rpt 5 yrs Ardis Hughs, Melene Plan, MD)  . CORONARY ARTERY BYPASS GRAFT N/A 02/07/2014   Procedure: CORONARY ARTERY BYPASS GRAFTING (CABG);  Surgeon: [name], MD;  Location: [facility];  Service: Open Heart Surgery;  Laterality: N/A;  CABG X 3, BILATERAL LIMA, EVH  . CYSTOSCOPY  08/16/08   Bladder Cancer  . EPIDURAL BLOCK INJECTION Left 09/2014, 10/2014, 12/2014   medial L2,3,4, dorsal L5 ramus blocks x2, L L5/S1 and S1/2 transforaminal ESI (Dalton Benedict)  . ESI  04/2013, 06/2013   L L5S1, S12 transforaminal ESI (Dr.  [name])  . ESI Left 04/2014, 05/2014, 06/2014   L5/S1, S1/2; rpt; L4/5  . ESI  03/2016   R L5/S1 interlaminar ESI  . ESOPHAGEAL MANOMETRY N/A 08/10/2017   Procedure: ESOPHAGEAL MANOMETRY (EM);  Surgeon: [name], MD;  Location: [facility];  Service: Endoscopy;  Laterality: N/A;  . ESOPHAGOGASTRODUODENOSCOPY (EGD) WITH PROPOFOL N/A 08/10/2017   Procedure: ESOPHAGOGASTRODUODENOSCOPY (EGD) WITH PROPOFOL;  Surgeon: [name], MD;  Location: [facility];  Service: Endoscopy;  Laterality: N/A;  . FULGURATION OF BLADDER TUMOR  01/2017   recurrent 21mm L lateral wall papillary transitional cell carcinoma ([place])  . INTRAOPERATIVE TRANSESOPHAGEAL ECHOCARDIOGRAM N/A 02/07/2014   Procedure: INTRAOPERATIVE TRANSESOPHAGEAL ECHOCARDIOGRAM;  Surgeon: [name], MD;  Location: [facility];  Service: Open Heart Surgery;  Laterality: N/A;  . LEFT HEART CATH AND CORS/GRAFTS ANGIOGRAPHY N/A 02/11/2017   Procedure: LEFT HEART CATH AND CORS/GRAFTS ANGIOGRAPHY;  Surgeon: [name], MD;  Location: [facility];  Service: Cardiovascular;  Laterality: N/A;  . LEFT HEART CATHETERIZATION WITH CORONARY ANGIOGRAM N/A 02/01/2014   Procedure: LEFT HEART CATHETERIZATION WITH CORONARY ANGIOGRAM;  Surgeon: [name], MD;  Location: [facility];  Service: Cardiovascular;  Laterality: N/A;  . PRP epidural injection Left 11/2015   L5/S1, S1/2 transforaminal epidural PRP injections under fluoroscopy (Dalton-Bethea)  . REPLACEMENT TOTAL HIP W/  RESURFACING IMPLANTS Left   . TONSILLECTOMY AND ADENOIDECTOMY  1973  . TOTAL  HIP ARTHROPLASTY Left 03/08/2017  . TOTAL HIP ARTHROPLASTY Left 03/08/2017   Procedure: LEFT TOTAL HIP ARTHROPLASTY ANTERIOR APPROACH;  Surgeon: [name], MD;  Location: [facility];  Service: Orthopedics;  Laterality: Left;  [name] VASECTOMY    . ZENKER'S DIVERTICULECTOMY  09/2017    Current Medications: Current Meds  Medication Sig  . acetaminophen (TYLENOL) 325 MG tablet Take 2 tablets (650 mg total) by mouth every 6 (six) hours as needed for moderate pain or headache.  . albuterol (PROAIR HFA) 108 (90 Base) MCG/ACT inhaler Inhale 2 puffs into the lungs every 4 (four) hours as needed for wheezing or shortness of breath.  [name] albuterol (PROVENTIL) (2.5 MG/3ML) 0.083% nebulizer solution Take 3 mLs (2.5 mg total) by nebulization every 6 (six) hours as needed for wheezing or shortness of breath.  [name] amLODipine (NORVASC) 5 MG tablet TAKE 1 TABLET BY MOUTH EVERY DAY  . atorvastatin (LIPITOR) 20 MG tablet TAKE  1 TABLET (20 MG TOTAL) BY MOUTH DAILY AT 6 PM.  . famotidine (PEPCID) 20 MG tablet TAKE 1 TABLET (20 MG TOTAL) BY MOUTH 2 (TWO) TIMES DAILY.  [name] Fluticasone-Umeclidin-Vilant (TRELEGY ELLIPTA) 100-62.5-25 MCG/INH AEPB Inhale 1 puff into the lungs daily.  [name] gabapentin (NEURONTIN) 300 MG capsule Take 1 capsule (300 mg total) by mouth at bedtime. First 3 days take once nightly  . glucose blood (ONE TOUCH ULTRA TEST) test strip CHECK ONCE DAILY AND AS NEEDED  . ipratropium-albuterol (DUONEB) 0.5-2.5 (3) MG/3ML SOLN TAKE 3 MLS BY NEBULIZATION EVERY 6 (SIX) HOURS AS NEEDED FOR UP TO 30 DAYS.  [name] metoprolol tartrate (LOPRESSOR) 25 MG tablet Take 0.5 tablets (12.5 mg total) by mouth 2 (two) times daily.  . tamsulosin (FLOMAX) 0.4 MG CAPS capsule Take 1 capsule (0.4 mg total) by mouth daily after supper.  . vitamin B-12 (CYANOCOBALAMIN) 500 MCG tablet Take 1 tablet (500 mcg total) by mouth every other day.  . [DISCONTINUED] aspirin EC 325 MG EC tablet Take 1 tablet (325 mg total) by mouth daily.    Current Facility-Administered Medications for the 01/23/19 encounter (Office Visit) with Imogene Burn, PA-C  Medication  . betamethasone acetate-betamethasone sodium phosphate (CELESTONE) injection 12 mg     Allergies:   Codeine and Doxycycline   Social History   Socioeconomic History  . Marital status: Married    Spouse name: Not on file  . Number of children: 2  . Years of education: Not on file  . Highest education level: Not on file  Occupational History  . Occupation: Self Employed  Social Needs  . Financial resource strain: Not on file  . Food insecurity    Worry: Not on file    Inability: Not on file  . Transportation needs    Medical: Not on file    Non-medical: Not on file  Tobacco Use  . Smoking status: Former Smoker    Packs/day: 1.00    Years: 40.00    Pack years: 40.00    Types: Cigarettes    Quit date: 02/05/2014    Years since quitting: 4.9  . Smokeless tobacco: Never Used  Substance and Sexual Activity  . Alcohol use: Yes    Alcohol/week: 0.0 standard drinks    Comment: 3-4 drinks per month  . Drug use: No  . Sexual activity: Not Currently  Lifestyle  . Physical activity    Days per week: Not on file    Minutes per session: Not on file  . Stress: Not on file  Relationships  . Social Herbalist on phone: Not on file    Gets together: Not on file    Attends religious service: Not on file    Active member of club or organization: Not on file    Attends meetings of clubs or organizations: Not on file    Relationship status: Not on file  Other Topics Concern  . Not on file  Social History Narrative   Caffeine: 1 cup coffee/day   Lives with wife, 1 dog   grown children   Occupation: Therapist, sports - self employed.  Disability after CABG   Edu: 2 yrs college   Activity: fishing, walking occasionally   Diet: good water, fruits/vegetables daily     Family History:  The patient's   family history includes AAA (abdominal  aortic aneurysm) in his maternal grandmother; Arrhythmia in his mother; Bladder Cancer in his mother; CAD (age of onset: 49) in his  father; COPD in his mother; Dementia in his father; Diabetes in his father, mother, and sister; Heart attack in his father and maternal grandmother; Hyperlipidemia in his mother; Hypertension in his mother; Thyroid disease in his mother.   ROS:   Please see the history of present illness.    ROS All other systems reviewed and are negative.   PHYSICAL EXAM:   VS:  BP 114/74   Pulse 90   Ht 5' 9.5" (1.765 m)   Wt 212 lb 12.8 oz (96.5 kg)   SpO2 90%   BMI 30.97 kg/m   Physical Exam  GEN: Well nourished, well developed, in no acute distress  Neck: no JVD, carotid bruits, or masses Cardiac:RRR; no murmurs, rubs, or gallops  Respiratory:  clear to auscultation bilaterally, normal work of breathing GI: soft, nontender, nondistended, + BS Ext: without cyanosis, clubbing, or edema, Good distal pulses bilaterally Neuro:  Alert and Oriented x 3 Psych: euthymic mood, full affect  Wt Readings from Last 3 Encounters:  01/23/19 212 lb 12.8 oz (96.5 kg)  01/03/19 215 lb (97.5 kg)  11/14/18 218 lb (98.9 kg)      Studies/Labs Reviewed:   EKG:  EKG is  ordered today.  The ekg ordered today demonstrates NSR old septal infarct  Recent Labs: 07/13/2018: TSH 2.20 11/10/2018: ALT 9; Brain Natriuretic Peptide 87; BUN 15; Creat 0.82; Hemoglobin 16.4; Platelets 274; Potassium 4.0; Sodium 141   Lipid Panel    Component Value Date/Time   CHOL 115 07/13/2018 0851   CHOL 170 01/05/2012   TRIG 115.0 07/13/2018 0851   TRIG 141 01/05/2012   HDL 46.50 07/13/2018 0851   CHOLHDL 2 07/13/2018 0851   VLDL 23.0 07/13/2018 0851   LDLCALC 46 07/13/2018 0851   LDLDIRECT 62.0 12/28/2017 0827    Additional studies/ records that were reviewed today include:    NST 2018The left ventricular ejection fraction is normal (55-65%).  Nuclear stress EF: 55%.  There was no ST segment  deviation noted during stress.  This is a low risk study.   This study is most probably normal. However there are significant perfusion defects in both rest and stress images in the mid and apical anterior and septal walls that are not associated with wall motion abnormalities. These are most probably artifacts. Consider coronary CTA for further evaluation.       Holter Monitor:  03/25/16   SR average HR 72 Isolated PAC;s / PVC;s Some short runs 10-15 beats atrial tachycardia   Echo 11/11/15 Vigorous LVF, EF 65-70%, no RWMA, Gr 1 DD, reduced RVSF, PASP 24 mmHg   24 Hr BP Monitor 4/17 Avg BP in normal range   LHC 02/11/17 reviewed Conclusion       Prox RCA lesion, 100 %stenosed.  Origin to Prox Graft lesion, 100 %stenosed.  Ost LAD to Prox LAD lesion, 100 %stenosed.  LIMA and is normal in caliber.  3rd Mrg lesion, 40 %stenosed.  RIMA and is normal in caliber.  Ost Cx lesion, 55 %stenosed.  Prox Cx lesion, 20 %stenosed.  There is mild left ventricular systolic dysfunction.   Mild LV dysfunction with an ejection fraction of 45-50% with mild mid to basal inferior hypocontractility and a nipple appearing focal apical aneurysm.   Significant native CAD with total occlusion of the proximal LAD after a prominent septal perforating artery; 55% stenosis prior to an ectatic aneurysmal segment in the proximal circumflex 20% mild narrowing between the OM1 and OM 2 with retrograde filling into the  OM 2 via the Anton Ruiz graft; and total occlusion of the proximal RCA with bridging collaterals to the mid RCA and collaterals arising from the distal circumflex and septal perforating artery of the LAD supplying the very distal branch vessels.   Patent LIMA graft supplying the mid LAD.   Patent RIMA graft supplying the OM 2 vessel of the circumflex   Old occluded vein graft, which had supplied the PDA region.   RECOMMENDATION: Medical therapy.  The patient will return to the care of Dr.  Roosvelt Harps in [place].  Based on his anatomy, which essentially is unchanged from 2 years ago,  cardiac clearance can be given for his hip surgery.                Carotid US (7/15):   Bilateral ICA 1-39%   ASSESSMENT:    1. Preoperative clearance   2. Coronary artery disease involving coronary bypass graft of native heart, angina presence unspecified   3. Essential hypertension   4. History of repair of aneurysm of abdominal aorta using endovascular stent graft   5. Hyperlipidemia, unspecified hyperlipidemia type      PLAN:  In order of problems listed above:  Peroperative clearance for Zenker's diverticulum-patient with daily chest pain and dyspnea on exertion and known occluded SVG. He doesn't think a stress test will be of any value with his prior blockage and he had a severe reaction with migraines after his last cath. Will order 2Decho and discuss further recommendations with Dr. Cathlean Sauer with Dr. [name] who recommends cardiac catheterization before clearing for surgery. Will contact patient. Says he developed severe migraine after last time he had a cath but I can't find documentation of that. Will verify with patient.  Addendum #2 I spoke with patient about Dr. Kyla Balzarine recommendation for cardiac cath before undergoing surgery. Patient is agreeable to proceed with cath. Will schedule for next week. In further speaking with patient he says the severe migraine he had with his first cath actually most likely came from NTG SL. He didn't have any trouble with his last cath. Will not need premedication. I have reviewed the risks, indications, and alternatives to angioplasty and stenting with the patient. Risks include but are not limited to bleeding, infection, vascular injury, stroke, myocardial infection, arrhythmia, kidney injury, radiation-related injury in the case of prolonged fluoroscopy use, emergency cardiac surgery, and death. The patient understands the risks of  serious complication is low (<7%) and patient agrees to proceed.    According to the Revised Cardiac Risk Index (RCRI), his Perioperative Risk of Major Cardiac Event is (%): 6.6  His Functional Capacity in METs is: 6.36 according to the [facility] Activity Status Index (DASI).    CAD S/P CABG 2015 cath for preop clearance 02/2017 patent LIMA to mLAD, RIMA to OM2, occluded SVG-PDA, native RCA fills bridging collaterals. EF45-50%, EF normal TTE 11/11/15.Decrease ASA to 81 mg daily.  Essential HTN BP well controlled  AAA endovascular repair 03/2012  HLD LDL 46 07/13/18  OSA on CPAP followed by Dr. Radford Pax      Medication Adjustments/Labs and Tests Ordered: Current medicines are reviewed at length with the patient today.  Concerns regarding medicines are outlined above.  Medication changes, Labs and Tests ordered today are listed in the Patient Instructions below. Patient Instructions  Medication Instructions:  Your physician has recommended you make the following change in your medication:   DECREASE: aspirin to 81 mg once a day  Lab work: None Ordered  If you have  labs (blood work) drawn today and your tests are completely normal, you will receive your results only by: [name] MyChart Message (if you have MyChart) OR . A paper copy in the mail If you have any lab test that is abnormal or we need to change your treatment, we will call you to review the results.  Testing/Procedures: Your physician has requested that you have an echocardiogram. Echocardiography is a painless test that uses sound waves to create images of your heart. It provides your doctor with information about the size and shape of your heart and how well your heart's chambers and valves are working. This procedure takes approximately one hour. There are no restrictions for this procedure.  Follow-Up: . Based on test results  Any Other Special Instructions Will Be Listed Below (If Applicable).       Sumner Boast, PA-C  01/23/2019 3:07 PM    [facility] [address], [place], [place]  [address] Phone: [phone]; Fax: [phone]

## 2019-01-25 ENCOUNTER — Telehealth: Payer: Self-pay

## 2019-01-25 NOTE — Telephone Encounter (Signed)
Called and reviewed cath instructions below with the patient. He will keep his echo scheduled on 7/22 at 9:35 AM. On the same day he will get labs at 10:30 AM and COVID test at 11:10 AM. Post cath f/u will be with Ermalinda Barrios, PA on 8/12 at 12:30 PM.    [facility] OFFICE [place], [place] [address] Dept: [phone] Loc: [place]  01/25/2019  You are scheduled for a Cardiac Catheterization on Friday, July 24 with Dr. Daneen Schick.  1. Please arrive at the [facility] (Main Entrance A) at [facility]: [address] [place], [place] [address] at 7:00 AM (This time is two hours before your procedure to ensure your preparation). Free valet parking service is available.   Special note: Every effort is made to have your procedure done on time. Please understand that emergencies sometimes delay scheduled procedures.  2. Diet: Do not eat solid foods after midnight.  The patient may have clear liquids until 5am upon the day of the procedure.  3. Labs: You will need to have blood drawn on Wednesday, July 22 at 10:30 AM at [facility]. [address], [place]  Phone: [phone]. You do not need to be fasting.  Your Pre-procedure COVID-19 Testing will be done on 01/31/19 at 11:10 AM at [facility] at [address] [place], [place] [address] After your swab you will be given a mask to wear and instructed to go home and quarantine/no visitors until after your procedure. If you test positive you will be notified and your procedure will be cancelled.   4. Medication instructions in preparation for your procedure:   Contrast Allergy: No  On the morning of your procedure, take your Aspirin and any of your regular morning medicines.  You may use sips of water.  5. Plan for one night stay--bring  personal belongings. 6. Bring a current list of your medications and current insurance cards. 7. You MUST have a responsible person to drive you home. 8. Someone MUST be with you the first 24 hours after you arrive home or your discharge will be delayed. 9. Please wear clothes that are easy to get on and off and wear slip-on shoes.  Thank you for allowing [place] to care for you!   -- [facility]

## 2019-01-25 NOTE — Telephone Encounter (Signed)
Discussed the case with Dr. Wilburn Cornelia who saw him last week and did laryngoscopy. It was suggested by Dr. Wilburn Cornelia for the patient to see Dr. Johney Frame at [facility] for evaluation and possible poor oral stapling of the diverticulum versus open repair . I discussed this with the patient and we will make a referral for the patient to be evaluated .  After this evaluation we will make final recommendations about endoscopic stapling versus open diverticulectomy . Patient was agreeable with this approach  Grace Isaac MD      [address] [place] [address] Office [phone]   Beeper [phone] r

## 2019-01-27 ENCOUNTER — Other Ambulatory Visit: Payer: Self-pay | Admitting: Nurse Practitioner

## 2019-01-31 ENCOUNTER — Other Ambulatory Visit: Payer: PPO | Admitting: *Deleted

## 2019-01-31 ENCOUNTER — Ambulatory Visit (HOSPITAL_BASED_OUTPATIENT_CLINIC_OR_DEPARTMENT_OTHER): Payer: PPO

## 2019-01-31 ENCOUNTER — Other Ambulatory Visit (HOSPITAL_COMMUNITY)
Admission: RE | Admit: 2019-01-31 | Discharge: 2019-01-31 | Disposition: A | Discharge status: Home / Self Care | Payer: PPO | Source: Ambulatory Visit | Attending: Interventional Cardiology | Admitting: Interventional Cardiology

## 2019-01-31 ENCOUNTER — Other Ambulatory Visit: Payer: Self-pay

## 2019-01-31 ENCOUNTER — Other Ambulatory Visit (HOSPITAL_COMMUNITY): Payer: PPO

## 2019-01-31 DIAGNOSIS — Z95828 Presence of other vascular implants and grafts: Secondary | ICD-10-CM | POA: Insufficient documentation

## 2019-01-31 DIAGNOSIS — Z01818 Encounter for other preprocedural examination: Secondary | ICD-10-CM | POA: Insufficient documentation

## 2019-01-31 DIAGNOSIS — G4733 Obstructive sleep apnea (adult) (pediatric): Secondary | ICD-10-CM | POA: Diagnosis not present

## 2019-01-31 DIAGNOSIS — Z951 Presence of aortocoronary bypass graft: Secondary | ICD-10-CM | POA: Insufficient documentation

## 2019-01-31 DIAGNOSIS — E785 Hyperlipidemia, unspecified: Secondary | ICD-10-CM

## 2019-01-31 DIAGNOSIS — I25810 Atherosclerosis of coronary artery bypass graft(s) without angina pectoris: Secondary | ICD-10-CM

## 2019-01-31 DIAGNOSIS — E119 Type 2 diabetes mellitus without complications: Secondary | ICD-10-CM | POA: Insufficient documentation

## 2019-01-31 DIAGNOSIS — Z87891 Personal history of nicotine dependence: Secondary | ICD-10-CM | POA: Diagnosis not present

## 2019-01-31 DIAGNOSIS — I10 Essential (primary) hypertension: Secondary | ICD-10-CM | POA: Insufficient documentation

## 2019-01-31 DIAGNOSIS — Z1159 Encounter for screening for other viral diseases: Secondary | ICD-10-CM | POA: Insufficient documentation

## 2019-01-31 LAB — CBC WITH DIFFERENTIAL/PLATELET
Basophils Absolute: 0.1 10*3/uL (ref 0.0–0.2)
Basos: 1 %
EOS (ABSOLUTE): 0.1 10*3/uL (ref 0.0–0.4)
Eos: 1 %
Hematocrit: 47.5 % (ref 37.5–51.0)
Hemoglobin: 16.7 g/dL (ref 13.0–17.7)
Immature Grans (Abs): 0.1 10*3/uL (ref 0.0–0.1)
Immature Granulocytes: 1 %
Lymphocytes Absolute: 2.8 10*3/uL (ref 0.7–3.1)
Lymphs: 29 %
MCH: 32.7 pg (ref 26.6–33.0)
MCHC: 35.2 g/dL (ref 31.5–35.7)
MCV: 93 fL (ref 79–97)
Monocytes Absolute: 0.7 10*3/uL (ref 0.1–0.9)
Monocytes: 8 %
Neutrophils Absolute: 6 10*3/uL (ref 1.4–7.0)
Neutrophils: 60 %
Platelets: 255 10*3/uL (ref 150–450)
RBC: 5.11 x10E6/uL (ref 4.14–5.80)
RDW: 12.6 % (ref 11.6–15.4)
WBC: 9.7 10*3/uL (ref 3.4–10.8)

## 2019-01-31 LAB — BASIC METABOLIC PANEL
BUN/Creatinine Ratio: 19 (ref 10–24)
BUN: 15 mg/dL (ref 8–27)
CO2: 23 mmol/L (ref 20–29)
Calcium: 9.6 mg/dL (ref 8.6–10.2)
Chloride: 102 mmol/L (ref 96–106)
Creatinine, Ser: 0.78 mg/dL (ref 0.76–1.27)
GFR calc Af Amer: 110 mL/min/{1.73_m2} (ref >59)
GFR calc non Af Amer: 95 mL/min/{1.73_m2} (ref >59)
Glucose: 107 mg/dL — ABNORMAL HIGH (ref 65–99)
Potassium: 4.4 mmol/L (ref 3.5–5.2)
Sodium: 142 mmol/L (ref 134–144)

## 2019-01-31 LAB — SARS CORONAVIRUS 2 (TAT 6-24 HRS): SARS Coronavirus 2: NEGATIVE

## 2019-01-31 MED ORDER — PERFLUTREN LIPID MICROSPHERE
1.0000 mL | INTRAVENOUS | Status: AC | PRN
  Administered 2019-01-31: 2 mL via INTRAVENOUS

## 2019-02-01 ENCOUNTER — Telehealth: Payer: Self-pay | Admitting: *Deleted

## 2019-02-01 NOTE — Telephone Encounter (Signed)
Pt contacted pre-catheterization scheduled at [facility] for: Friday February 02, 2019 9 AM Verified arrival time and place: [place] Entrance A at: 7 AM  Covid-19 test date: 01/31/19   No solid food after midnight prior to cath, clear liquids until 5 AM day of procedure. Contrast allergy: no  AM meds can be  taken pre-cath with sip of water including: ASA 325 mg-pt will check tomorrow to see if he should reduce dose to 81 mg.   Confirmed patient has responsible person to drive home post procedure and observe 24 hours after arriving home: yes  Due to Covid-19 pandemic, only one support person will be allowed with patient. Must be the same support person for that patient's entire stay. On arrival the support person will be required to wear a mask and will be screened, including having temporal temperature checked (below 100.4 to be cleared). They will be required to wait in the [facility] waiting room for the duration of the procedure. To limit exposure, MDs will continue to call support person after the procedure instead of speaking with them in consult room.   Patients are required to wear a mask when they enter the hospital.       COVID-19 Pre-Screening Questions:  . In the past 7 to 10 days have you had a cough,  shortness of breath, headache, congestion, fever (100 or greater) body aches, chills, sore throat, or sudden loss of taste or sense of smell? no . Have you been around anyone with known Covid 19? no . Have you been around anyone who is awaiting Covid 19 test results in the past 7 to 10 days? no . Have you been around anyone who has been exposed to Covid 19, or has mentioned symptoms of Covid 19 within the past 7 to 10 days? no   I reviewed procedure/mask/visitor, Covid-19 screening questions with patient, he verbalized understanding, thanked me for call.

## 2019-02-02 ENCOUNTER — Ambulatory Visit (HOSPITAL_COMMUNITY)
Admission: RE | Admit: 2019-02-02 | Discharge: 2019-02-02 | Disposition: A | Discharge status: Home / Self Care | Payer: PPO | Attending: Interventional Cardiology | Admitting: Interventional Cardiology

## 2019-02-02 ENCOUNTER — Encounter (HOSPITAL_COMMUNITY)
Admission: RE | Disposition: A | Discharge status: Home / Self Care | Payer: PPO | Source: Home / Self Care | Attending: Interventional Cardiology

## 2019-02-02 ENCOUNTER — Encounter (HOSPITAL_COMMUNITY): Payer: Self-pay | Admitting: Interventional Cardiology

## 2019-02-02 DIAGNOSIS — E785 Hyperlipidemia, unspecified: Secondary | ICD-10-CM | POA: Diagnosis not present

## 2019-02-02 DIAGNOSIS — Z881 Allergy status to other antibiotic agents status: Secondary | ICD-10-CM | POA: Insufficient documentation

## 2019-02-02 DIAGNOSIS — Z79899 Other long term (current) drug therapy: Secondary | ICD-10-CM | POA: Insufficient documentation

## 2019-02-02 DIAGNOSIS — I10 Essential (primary) hypertension: Secondary | ICD-10-CM | POA: Diagnosis not present

## 2019-02-02 DIAGNOSIS — G8929 Other chronic pain: Secondary | ICD-10-CM | POA: Diagnosis present

## 2019-02-02 DIAGNOSIS — E119 Type 2 diabetes mellitus without complications: Secondary | ICD-10-CM | POA: Diagnosis not present

## 2019-02-02 DIAGNOSIS — K225 Diverticulum of esophagus, acquired: Secondary | ICD-10-CM | POA: Diagnosis not present

## 2019-02-02 DIAGNOSIS — Z01818 Encounter for other preprocedural examination: Secondary | ICD-10-CM | POA: Insufficient documentation

## 2019-02-02 DIAGNOSIS — Z87891 Personal history of nicotine dependence: Secondary | ICD-10-CM | POA: Insufficient documentation

## 2019-02-02 DIAGNOSIS — Z8551 Personal history of malignant neoplasm of bladder: Secondary | ICD-10-CM | POA: Insufficient documentation

## 2019-02-02 DIAGNOSIS — R079 Chest pain, unspecified: Secondary | ICD-10-CM | POA: Diagnosis present

## 2019-02-02 DIAGNOSIS — Z8249 Family history of ischemic heart disease and other diseases of the circulatory system: Secondary | ICD-10-CM | POA: Insufficient documentation

## 2019-02-02 DIAGNOSIS — G4733 Obstructive sleep apnea (adult) (pediatric): Secondary | ICD-10-CM | POA: Diagnosis not present

## 2019-02-02 DIAGNOSIS — Z7982 Long term (current) use of aspirin: Secondary | ICD-10-CM | POA: Insufficient documentation

## 2019-02-02 DIAGNOSIS — I2510 Atherosclerotic heart disease of native coronary artery without angina pectoris: Secondary | ICD-10-CM

## 2019-02-02 DIAGNOSIS — I25708 Atherosclerosis of coronary artery bypass graft(s), unspecified, with other forms of angina pectoris: Secondary | ICD-10-CM | POA: Insufficient documentation

## 2019-02-02 DIAGNOSIS — J449 Chronic obstructive pulmonary disease, unspecified: Secondary | ICD-10-CM | POA: Diagnosis not present

## 2019-02-02 DIAGNOSIS — Z885 Allergy status to narcotic agent status: Secondary | ICD-10-CM | POA: Diagnosis not present

## 2019-02-02 DIAGNOSIS — I25810 Atherosclerosis of coronary artery bypass graft(s) without angina pectoris: Secondary | ICD-10-CM | POA: Diagnosis not present

## 2019-02-02 DIAGNOSIS — Z951 Presence of aortocoronary bypass graft: Secondary | ICD-10-CM

## 2019-02-02 HISTORY — PX: LEFT HEART CATH AND CORS/GRAFTS ANGIOGRAPHY: CATH118250

## 2019-02-02 LAB — GLUCOSE, CAPILLARY: Glucose-Capillary: 101 mg/dL — ABNORMAL HIGH (ref 70–99)

## 2019-02-02 SURGERY — LEFT HEART CATH AND CORS/GRAFTS ANGIOGRAPHY
Anesthesia: LOCAL

## 2019-02-02 MED ORDER — HEPARIN (PORCINE) IN NACL 1000-0.9 UT/500ML-% IV SOLN
INTRAVENOUS | Status: DC | PRN
  Administered 2019-02-02 (×2): 500 mL

## 2019-02-02 MED ORDER — FENTANYL CITRATE (PF) 100 MCG/2ML IJ SOLN
INTRAMUSCULAR | Status: DC | PRN
  Administered 2019-02-02 (×3): 25 ug via INTRAVENOUS

## 2019-02-02 MED ORDER — ASPIRIN 81 MG PO CHEW: 81.0000 mg | CHEWABLE_TABLET | ORAL | Status: DC

## 2019-02-02 MED ORDER — FENTANYL CITRATE (PF) 100 MCG/2ML IJ SOLN
INTRAMUSCULAR | Status: AC
  Filled 2019-02-02: qty 2

## 2019-02-02 MED ORDER — SODIUM CHLORIDE 0.9 % IV SOLN: 250.0000 mL | INTRAVENOUS | Status: DC | PRN

## 2019-02-02 MED ORDER — HEPARIN SODIUM (PORCINE) 1000 UNIT/ML IJ SOLN
INTRAMUSCULAR | Status: AC
  Filled 2019-02-02: qty 1

## 2019-02-02 MED ORDER — SODIUM CHLORIDE 0.9% FLUSH: 3.0000 mL | Freq: Two times a day (BID) | INTRAVENOUS | Status: DC

## 2019-02-02 MED ORDER — ACETAMINOPHEN 325 MG PO TABS: 650.0000 mg | ORAL_TABLET | ORAL | Status: DC | PRN

## 2019-02-02 MED ORDER — ONDANSETRON HCL 4 MG/2ML IJ SOLN: 4.0000 mg | Freq: Four times a day (QID) | INTRAMUSCULAR | Status: DC | PRN

## 2019-02-02 MED ORDER — IOHEXOL 350 MG/ML SOLN
INTRAVENOUS | Status: DC | PRN
  Administered 2019-02-02: 80 mL via INTRAVENOUS

## 2019-02-02 MED ORDER — MIDAZOLAM HCL 2 MG/2ML IJ SOLN
INTRAMUSCULAR | Status: DC | PRN
  Administered 2019-02-02 (×3): 0.5 mg via INTRAVENOUS

## 2019-02-02 MED ORDER — HEPARIN SODIUM (PORCINE) 1000 UNIT/ML IJ SOLN
INTRAMUSCULAR | Status: DC | PRN
  Administered 2019-02-02: 1500 [IU] via INTRAVENOUS

## 2019-02-02 MED ORDER — LIDOCAINE HCL (PF) 1 % IJ SOLN
INTRAMUSCULAR | Status: DC | PRN
  Administered 2019-02-02: 15 mL

## 2019-02-02 MED ORDER — SODIUM CHLORIDE 0.9 % WEIGHT BASED INFUSION: 1.0000 mL/kg/h | INTRAVENOUS | Status: DC

## 2019-02-02 MED ORDER — SODIUM CHLORIDE 0.9% FLUSH: 3.0000 mL | INTRAVENOUS | Status: DC | PRN

## 2019-02-02 MED ORDER — LIDOCAINE HCL (PF) 1 % IJ SOLN
INTRAMUSCULAR | Status: AC
  Filled 2019-02-02: qty 30

## 2019-02-02 MED ORDER — HYDRALAZINE HCL 20 MG/ML IJ SOLN: 10.0000 mg | INTRAMUSCULAR | Status: DC | PRN

## 2019-02-02 MED ORDER — SODIUM CHLORIDE 0.9 % IV SOLN: INTRAVENOUS | Status: DC

## 2019-02-02 MED ORDER — SODIUM CHLORIDE 0.9 % WEIGHT BASED INFUSION
3.0000 mL/kg/h | INTRAVENOUS | Status: AC
  Administered 2019-02-02: 3 mL/kg/h via INTRAVENOUS

## 2019-02-02 MED ORDER — HEPARIN (PORCINE) IN NACL 1000-0.9 UT/500ML-% IV SOLN
INTRAVENOUS | Status: AC
  Filled 2019-02-02: qty 1000

## 2019-02-02 MED ORDER — LABETALOL HCL 5 MG/ML IV SOLN: 10.0000 mg | INTRAVENOUS | Status: DC | PRN

## 2019-02-02 MED ORDER — MIDAZOLAM HCL 2 MG/2ML IJ SOLN
INTRAMUSCULAR | Status: AC
  Filled 2019-02-02: qty 2

## 2019-02-02 SURGICAL SUPPLY — 14 items
CATH INFINITI 5 FR IM (CATHETERS) ×1 IMPLANT
CATH INFINITI 5FR MPB2 (CATHETERS) ×1 IMPLANT
CLOSURE MYNX CONTROL 5F (Vascular Products) ×1 IMPLANT
GUIDEWIRE ANGLED .035X150CM (WIRE) ×1 IMPLANT
GUIDEWIRE INQWIRE 1.5J.035X260 (WIRE) IMPLANT
INQWIRE 1.5J .035X260CM (WIRE)
KIT HEART LEFT (KITS) ×2 IMPLANT
PACK CARDIAC CATHETERIZATION (CUSTOM PROCEDURE TRAY) ×2 IMPLANT
SHEATH PINNACLE 5F 10CM (SHEATH) ×1 IMPLANT
SHEATH PROBE COVER 6X72 (BAG) ×1 IMPLANT
TRANSDUCER W/STOPCOCK (MISCELLANEOUS) ×2 IMPLANT
TUBING CIL FLEX 10 FLL-RA (TUBING) ×2 IMPLANT
WIRE EMERALD 3MM-J .035X150CM (WIRE) ×1 IMPLANT
WIRE HI TORQ VERSACORE-J 145CM (WIRE) ×1 IMPLANT

## 2019-02-02 NOTE — Interval H&P Note (Signed)
Cath Lab Visit (complete for each Cath Lab visit)  Clinical Evaluation Leading to the Procedure:   ACS: No.  Non-ACS:    Anginal Classification: CCS II  Anti-ischemic medical therapy: Minimal Therapy (1 class of medications)  Non-Invasive Test Results: No non-invasive testing performed  Prior CABG: Previous CABG      History and Physical Interval Note:  02/02/2019 8:31 AM  Carl Lee  has presented today for surgery, with the diagnosis of Angina.  The various methods of treatment have been discussed with the patient and family. After consideration of risks, benefits and other options for treatment, the patient has consented to  Procedure(s): LEFT HEART CATH AND CORS/GRAFTS ANGIOGRAPHY (N/A) as a surgical intervention.  The patient's history has been reviewed, patient examined, no change in status, stable for surgery.  I have reviewed the patient's chart and labs.  Questions were answered to the patient's satisfaction.     Belva Crome III

## 2019-02-02 NOTE — Discharge Instructions (Signed)
Femoral Site Care °This sheet gives you information about how to care for yourself after your procedure. Your health care provider may also give you more specific instructions. If you have problems or questions, contact your health care provider. °What can I expect after the procedure? °After the procedure, it is common to have: °· Bruising that usually fades within 1-2 weeks. °· Tenderness at the site. °Follow these instructions at home: °Wound care °· Follow instructions from your health care provider about how to take care of your insertion site. Make sure you: °? Wash your hands with soap and water before you change your bandage (dressing). If soap and water are not available, use hand sanitizer. °? Change your dressing as told by your health care provider. °? Leave stitches (sutures), skin glue, or adhesive strips in place. These skin closures may need to stay in place for 2 weeks or longer. If adhesive strip edges start to loosen and curl up, you may trim the loose edges. Do not remove adhesive strips completely unless your health care provider tells you to do that. °· Do not take baths, swim, or use a hot tub until your health care provider approves. °· You may shower 24-48 hours after the procedure or as told by your health care provider. °? Gently wash the site with plain soap and water. °? Pat the area dry with a clean towel. °? Do not rub the site. This may cause bleeding. °· Do not apply powder or lotion to the site. Keep the site clean and dry. °· Check your femoral site every day for signs of infection. Check for: °? Redness, swelling, or pain. °? Fluid or blood. °? Warmth. °? Pus or a bad smell. °Activity °· For the first 2-3 days after your procedure, or as long as directed: °? Avoid climbing stairs as much as possible. °? Do not squat. °· Do not lift anything that is heavier than 10 lb (4.5 kg), or the limit that you are told, until your health care provider says that it is safe. °· Rest as  directed. °? Avoid sitting for a long time without moving. Get up to take short walks every 1-2 hours. °· Do not drive for 24 hours if you were given a medicine to help you relax (sedative). °General instructions °· Take over-the-counter and prescription medicines only as told by your health care provider. °· Keep all follow-up visits as told by your health care provider. This is important. °Contact a health care provider if you have: °· A fever or chills. °· You have redness, swelling, or pain around your insertion site. °Get help right away if: °· The catheter insertion area swells very fast. °· You pass out. °· You suddenly start to sweat or your skin gets clammy. °· The catheter insertion area is bleeding, and the bleeding does not stop when you hold steady pressure on the area. °· The area near or just beyond the catheter insertion site becomes pale, cool, tingly, or numb. °These symptoms may represent a serious problem that is an emergency. Do not wait to see if the symptoms will go away. Get medical help right away. Call your local emergency services (911 in the [place] Do not drive yourself to the hospital. °Summary °· After the procedure, it is common to have bruising that usually fades within 1-2 weeks. °· Check your femoral site every day for signs of infection. °· Do not lift anything that is heavier than 10 lb (4.5 kg), or the   limit that you are told, until your health care provider says that it is safe. °This information is not intended to replace advice given to you by your health care provider. Make sure you discuss any questions you have with your health care provider. °Document Released: 03/01/2014 Document Revised: 07/11/2017 Document Reviewed: 07/11/2017 °Elsevier Patient Education © 2020 Elsevier Inc. ° °

## 2019-02-02 NOTE — Progress Notes (Addendum)
      [address]       [place] [address]             [phone]      Patient seen after cardiac catheterization, in  holding area, changed  from cath two years ago, continued medical therapy no need for coronary intervention. Cardiac catheterization reviewed and discussed with the patient. In discussion and with information from  Dr. Rush Landmark , I discussed with the patient referral for an opinion  for per oral treatment of his Zenker's from Dr. Norberto Sorenson " [facility] .  Such treatment is not offered in the  [facility] system.  I have discussed with the patient standard left neck incision, myotomy and diverticulectomy for Zenker's as an option.  The patient would like to hear all options before making a decision and  is willing to see Dr. Harl Bowie in the advanced GI endoscopy division at [facility].  We will help the patient facilitate this consultation and afterwards make a final decision about treatment of his symptomatic Zenker's diverticulum.   Grace Isaac MD     [address] [place] [address] Office [phone]   [place]

## 2019-02-02 NOTE — Progress Notes (Signed)
No bleeding or swelling after ambulation

## 2019-02-02 NOTE — CV Procedure (Addendum)
   Left heart cath with coronary angiography, bypass graft angiography, and left ventriculography.  Right femoral access with real-time vascular ultrasound guidance.  Severe native disease with 100% occlusion of the proximal LAD, 100% occlusion of the proximal RCA, and aneurysmal disease in the proximal to mid circumflex with patent branches OM1 and 3.  Competitive flow in OM 2.  Occluded SVG to RCA.[name]  Patent left internal mammary to LAD  Patent right internal mammary to second OM  Normal LV hemodynamics with normal LVEDP.  EF 65%.  No change when compared to angiography performed 2 years ago.  Minx closure with good hemostasis.

## 2019-02-08 ENCOUNTER — Ambulatory Visit (INDEPENDENT_AMBULATORY_CARE_PROVIDER_SITE_OTHER): Payer: PPO | Admitting: Physical Medicine and Rehabilitation

## 2019-02-08 ENCOUNTER — Encounter: Payer: Self-pay | Admitting: Physical Medicine and Rehabilitation

## 2019-02-08 ENCOUNTER — Ambulatory Visit: Payer: PPO | Admitting: Cardiothoracic Surgery

## 2019-02-08 ENCOUNTER — Other Ambulatory Visit: Payer: Self-pay | Admitting: Cardiovascular Disease

## 2019-02-08 ENCOUNTER — Ambulatory Visit: Payer: Self-pay

## 2019-02-08 ENCOUNTER — Other Ambulatory Visit: Payer: Self-pay

## 2019-02-08 VITALS — BP 118/72 | HR 79

## 2019-02-08 DIAGNOSIS — M47816 Spondylosis without myelopathy or radiculopathy, lumbar region: Secondary | ICD-10-CM | POA: Diagnosis not present

## 2019-02-08 DIAGNOSIS — R7303 Prediabetes: Secondary | ICD-10-CM | POA: Diagnosis not present

## 2019-02-08 DIAGNOSIS — H10232 Serous conjunctivitis, except viral, left eye: Secondary | ICD-10-CM | POA: Diagnosis not present

## 2019-02-08 DIAGNOSIS — R269 Unspecified abnormalities of gait and mobility: Secondary | ICD-10-CM | POA: Diagnosis not present

## 2019-02-08 DIAGNOSIS — G4733 Obstructive sleep apnea (adult) (pediatric): Secondary | ICD-10-CM | POA: Diagnosis not present

## 2019-02-08 DIAGNOSIS — H353131 Nonexudative age-related macular degeneration, bilateral, early dry stage: Secondary | ICD-10-CM | POA: Diagnosis not present

## 2019-02-08 DIAGNOSIS — H04123 Dry eye syndrome of bilateral lacrimal glands: Secondary | ICD-10-CM | POA: Diagnosis not present

## 2019-02-08 DIAGNOSIS — J449 Chronic obstructive pulmonary disease, unspecified: Secondary | ICD-10-CM | POA: Diagnosis not present

## 2019-02-08 MED ORDER — BUPIVACAINE HCL 0.5 % IJ SOLN: 3.0000 mL | Freq: Once | INTRAMUSCULAR | Status: DC

## 2019-02-08 MED ORDER — METHYLPREDNISOLONE ACETATE 80 MG/ML IJ SUSP: 80.0000 mg | Freq: Once | INTRAMUSCULAR | Status: DC

## 2019-02-08 NOTE — Progress Notes (Signed)
.  Numeric Pain Rating Scale and Functional Assessment Average Pain 9   In the last MONTH (on 0-10 scale) has pain interfered with the following?  1. General activity like being  able to carry out your everyday physical activities such as walking, climbing stairs, carrying groceries, or moving a chair?  Rating(8)   +Driver, -BT, -Dye Allergies.  

## 2019-02-13 DIAGNOSIS — J449 Chronic obstructive pulmonary disease, unspecified: Secondary | ICD-10-CM | POA: Diagnosis not present

## 2019-02-13 DIAGNOSIS — G4733 Obstructive sleep apnea (adult) (pediatric): Secondary | ICD-10-CM | POA: Diagnosis not present

## 2019-02-21 ENCOUNTER — Other Ambulatory Visit: Payer: Self-pay

## 2019-02-21 ENCOUNTER — Encounter: Payer: Self-pay | Admitting: Physician Assistant

## 2019-02-21 ENCOUNTER — Ambulatory Visit: Payer: PPO | Admitting: Physician Assistant

## 2019-02-21 VITALS — BP 116/60 | HR 81 | Ht 70.0 in | Wt 207.9 lb

## 2019-02-21 DIAGNOSIS — G4733 Obstructive sleep apnea (adult) (pediatric): Secondary | ICD-10-CM

## 2019-02-21 DIAGNOSIS — E785 Hyperlipidemia, unspecified: Secondary | ICD-10-CM | POA: Diagnosis not present

## 2019-02-21 DIAGNOSIS — I2510 Atherosclerotic heart disease of native coronary artery without angina pectoris: Secondary | ICD-10-CM

## 2019-02-21 DIAGNOSIS — I10 Essential (primary) hypertension: Secondary | ICD-10-CM

## 2019-02-21 MED ORDER — ASPIRIN EC 81 MG PO TBEC: 81.0000 mg | DELAYED_RELEASE_TABLET | Freq: Every day | ORAL | 3 refills | Status: DC

## 2019-02-21 NOTE — Progress Notes (Addendum)
Cardiology Office Note    Date:  02/21/2019   ID:  Vahan, Wadsworth Jun 11, 1956, MRN [id number]  PCP:  [name], MD  Cardiologist: [name], MD EPS: None  Chief Complaint  Patient presents with  . Follow-up    History of Present Illness:  Carl Lee is a 63 y.o. male with history of CAD s/p CABG in 2015 with Dr. [name] (L-LAD, RIMA-OM, S-PDA) AAA s/p endovascular repair in 9/13 by Dr. [name], HTN, HL, DM2, OSA on CPAP, tobacco abuse-quit 3 months ago, recurrent bladder CA, carotid disease.    cath 02/11/17 for preop right hip surgery clearance. Stable with patent LIMA to mid LAD, RIMA to OM2 Occluded SVG PDA native RCA fills bridging collaterals EF 45-50% EF was normal by TTE done 11/11/15, residual AAA 4.0 cm 2017.   Last saw Dr. [name] 09/12/18 with multiple types of chest pain intolerant to Imdur. On ranexa and capzasin cream to chest for chest wall pain. Didn't think he needed a heart cath. Chronic dyspnea felt secondary to asthmatic COPD followed by Dr. [name].   Patient needed clearance for Zenker's Diverticulum by Dr. Servando Snare and was having a lot of chest pressure and shortness of breath walking up stairs.   Cardiac cath 02/02/19 patent LIMA to LAD, patent RIMA to OM, total SVG to the distal RCA, total proximal LAD, origin to proximal graft lesion is 100% stenosed, total proximal RCA distal RCA fills by collaterals from first septal perforator.  Mild mid to distal anterior wall hypokinesis EF 45 to 50%.  No change from 2 years ago.  Patient has had chronic chest pain since bypass surgery.  Dr. Servando Snare evaluated the patient in the hospital and is referring him for possible surgery at [facility].  Patient comes in today for f/u. No change in his chronic chest pain and dyspnea on exertion.  Has an appointment with Dr. Nancy Nordmann at [facility] on August 24.  Patient asking about aspirin dose.  He says Dr. [name] is always wanted him on 325 mg daily.  He has thin skin and  bleeds easily and would like to take 81 mg daily.   Past Medical History:  Diagnosis Date  . AAA (abdominal aortic aneurysm) ([place]) 03/25/12   s/p endovascular repair  . Adenomatous polyp 06/2009  . Atrial tachycardia, paroxysmal ([place]) 06/18/2016  . Bladder cancer ([place]) 08/16/2008   recurrence 2015 and 2018 treated with office fulguration  . CAD (coronary artery disease), native coronary artery    S/p 3v CABG 01/2014   . Cataract 2019   bilateral eyes  . Childhood asthma    as a child  . Complication of anesthesia    "takes me a year to get over"  . Contact dermatitis    atypical [name])  . Controlled diabetes mellitus type 2 with complications ([place]) 83/41/9622   CAD   . COPD (chronic obstructive pulmonary disease) ([place])   . Coronary artery disease    a.2015: CABG w/ LIMA-LAD, RIMA-OM, SVG-RCA b. Cath on 07/10/15 w/ patent LIMA-LAD and RIMA-OM. SVG-RCA occluded but collaterals present  . Diverticulosis 2013   severe by CT and colonoscopy  . Environmental allergies    improved as ages  . Fracture of lateral malleolus of left ankle 08/29/2013  . Headache    hasn't had migraines in years  . Hepatic steatosis 06/2015   by [place]  . Hx of migraines   . Hypertension   . Lactose intolerance 11/14/2018   Endorses for  years - avoids dairy products  . Lumbosacral radiculopathy at S1 03/2012   left, with spinal stenosis (MRI 04/2013) improved with TF ESI L5/S1 and S1/2 (Dalton Kempner)  . OSA (obstructive sleep apnea) 09/04/2014   Severe with AHI 35/hr, uses BIPAP  . Overweight (BMI 25.0-29.9) 01/26/2017  . Pre-diabetes   . Prediabetes 06/08/2014   CAD   . Resistance to clopidogrel 2015   drug metabolism panel run - asked to scan  . Spinal stenosis    LS1 nerve root impingement from bulging disc  . Vitamin B12 deficiency   . Zenker diverticulum     Past Surgical History:  Procedure Laterality Date  . ABDOMINAL AORTIC ANEURYSM REPAIR  03/25/12   endovascular  . ABI  05/2013    WNL, L TBI low at 0.66  . Bladder cancer  March 2010 and Oct. 2015   Ernst Spell) x 2  . CARDIAC CATHETERIZATION N/A 07/10/2015   Procedure: Left Heart Cath and Cors/Grafts Angiography;  Surgeon: [name], MD;  Location: [place] [facility];  Service: Cardiovascular;  Laterality: N/A;  . CATARACT EXTRACTION W/ INTRAOCULAR LENS IMPLANT Bilateral 07/2018, 08/2018  . COLONOSCOPY  06/2012   hyperplastic polyp, diverticulosis ([place]) rec rpt 5 yrs  . COLONOSCOPY WITH PROPOFOL N/A 08/10/2017   TA, rpt 5 yrs Ardis Hughs, Melene Plan, MD)  . CORONARY ARTERY BYPASS GRAFT N/A 02/07/2014   Procedure: CORONARY ARTERY BYPASS GRAFTING (CABG);  Surgeon: [name], MD;  Location: [place];  Service: Open Heart Surgery;  Laterality: N/A;  CABG X 3, BILATERAL LIMA, EVH  . CYSTOSCOPY  08/16/08   Bladder Cancer  . EPIDURAL BLOCK INJECTION Left 09/2014, 10/2014, 12/2014   medial L2,3,4, dorsal L5 ramus blocks x2, L L5/S1 and S1/2 transforaminal ESI (Dalton Davidson)  . ESI  04/2013, 06/2013   L L5S1, S12 transforaminal ESI (Dr.  [name])  . ESI Left 04/2014, 05/2014, 06/2014   L5/S1, S1/2; rpt; L4/5  . ESI  03/2016   R L5/S1 interlaminar ESI  . ESOPHAGEAL MANOMETRY N/A 08/10/2017   Procedure: ESOPHAGEAL MANOMETRY (EM);  Surgeon: [name], MD;  Location: [facility];  Service: Endoscopy;  Laterality: N/A;  . ESOPHAGOGASTRODUODENOSCOPY (EGD) WITH PROPOFOL N/A 08/10/2017   Procedure: ESOPHAGOGASTRODUODENOSCOPY (EGD) WITH PROPOFOL;  Surgeon: [name], MD;  Location: [facility];  Service: Endoscopy;  Laterality: N/A;  . FULGURATION OF BLADDER TUMOR  01/2017   recurrent 76mm L lateral wall papillary transitional cell carcinoma ([place])  . INTRAOPERATIVE TRANSESOPHAGEAL ECHOCARDIOGRAM N/A 02/07/2014   Procedure: INTRAOPERATIVE TRANSESOPHAGEAL ECHOCARDIOGRAM;  Surgeon: [name], MD;  Location: [place];  Service: Open Heart Surgery;  Laterality: N/A;  . LEFT HEART CATH AND CORS/GRAFTS  ANGIOGRAPHY N/A 02/11/2017   Procedure: LEFT HEART CATH AND CORS/GRAFTS ANGIOGRAPHY;  Surgeon: [name], MD;  Location: [facility];  Service: Cardiovascular;  Laterality: N/A;  . LEFT HEART CATH AND CORS/GRAFTS ANGIOGRAPHY N/A 02/02/2019   Procedure: LEFT HEART CATH AND CORS/GRAFTS ANGIOGRAPHY;  Surgeon: Belva Crome, MD;  Location: [facility];  Service: Cardiovascular;  Laterality: N/A;  . LEFT HEART CATHETERIZATION WITH CORONARY ANGIOGRAM N/A 02/01/2014   Procedure: LEFT HEART CATHETERIZATION WITH CORONARY ANGIOGRAM;  Surgeon: [name], MD;  Location: [facility];  Service: Cardiovascular;  Laterality: N/A;  . PRP epidural injection Left 11/2015   L5/S1, S1/2 transforaminal epidural PRP injections under fluoroscopy ([place]-B[place]a)  . REPLACEMENT TOTAL HIP W/  RESURFACING IMPLANTS Left   . TONSILLECTOMY AND  ADENOIDECTOMY  1973  . TOTAL HIP ARTHROPLASTY Left 03/08/2017  . TOTAL HIP ARTHROPLASTY Left 03/08/2017   Procedure: LEFT TOTAL HIP ARTHROPLASTY ANTERIOR APPROACH;  Surgeon: [name], MD;  Location: [facility];  Service: Orthopedics;  Laterality: Left;  [name] VASECTOMY    . ZENKER'S DIVERTICULECTOMY  09/2017    Current Medications: Current Meds  Medication Sig  . acetaminophen (TYLENOL) 325 MG tablet Take 2 tablets (650 mg total) by mouth every 6 (six) hours as needed for moderate pain or headache.  . albuterol (PROAIR HFA) 108 (90 Base) MCG/ACT inhaler Inhale 2 puffs into the lungs every 4 (four) hours as needed for wheezing or shortness of breath.  [name] amLODipine (NORVASC) 5 MG tablet TAKE 1 TABLET BY MOUTH EVERY DAY  . aspirin EC 325 MG tablet Take 325 mg by mouth daily.  [name] aspirin EC 81 MG tablet Take 1 tablet (81 mg total) by mouth daily.  [name] atorvastatin (LIPITOR) 20 MG tablet TAKE 1 TABLET (20 MG TOTAL) BY MOUTH DAILY AT 6 PM.  . calcium carbonate (TUMS - DOSED IN MG ELEMENTAL CALCIUM) 500 MG chewable tablet Chew 2 tablets by mouth daily as  needed for indigestion or heartburn.  . Carboxymethylcellul-Glycerin (LUBRICATING EYE DROPS OP) Place 1 drop into both eyes daily as needed (dry eyes).  . cholecalciferol (VITAMIN D3) 25 MCG (1000 UT) tablet Take 1,000 Units by mouth daily.  . famotidine (PEPCID) 20 MG tablet TAKE 1 TABLET (20 MG TOTAL) BY MOUTH 2 (TWO) TIMES DAILY.  [name] Fluticasone-Umeclidin-Vilant (TRELEGY ELLIPTA) 100-62.5-25 MCG/INH AEPB Inhale 1 puff into the lungs daily.  [name] gabapentin (NEURONTIN) 300 MG capsule Take 1 capsule (300 mg total) by mouth at bedtime. First 3 days take once nightly  . glucose blood (ONE TOUCH ULTRA TEST) test strip CHECK ONCE DAILY AND AS NEEDED  . HYDROcodone-acetaminophen (NORCO/VICODIN) 5-325 MG tablet Take 0.5 tablets by mouth every 6 (six) hours as needed for moderate pain.   [name] ipratropium-albuterol (DUONEB) 0.5-2.5 (3) MG/3ML SOLN TAKE 3 MLS BY NEBULIZATION EVERY 6 (SIX) HOURS AS NEEDED FOR UP TO 30 DAYS.  [name] ketotifen (ZADITOR) 0.025 % ophthalmic solution Place 1 drop into both eyes daily as needed (allergies).  . metoprolol tartrate (LOPRESSOR) 25 MG tablet Take 0.5 tablets (12.5 mg total) by mouth 2 (two) times daily.  [name] nystatin (MYCOSTATIN) 100000 UNIT/ML suspension Use as directed 5 mLs in the mouth or throat 4 (four) times daily.  . Simethicone (GAS-X PO) Take 1 tablet by mouth daily as needed (gas).  . tamsulosin (FLOMAX) 0.4 MG CAPS capsule Take 1 capsule (0.4 mg total) by mouth daily after supper.  . vitamin B-12 (CYANOCOBALAMIN) 500 MCG tablet Take 1 tablet (500 mcg total) by mouth every other day.   Current Facility-Administered Medications for the 02/21/19 encounter (Office Visit) with Imogene Burn, PA-C  Medication  . betamethasone acetate-betamethasone sodium phosphate (CELESTONE) injection 12 mg  . bupivacaine (MARCAINE) 0.5 % (with pres) injection 3 mL  . methylPREDNISolone acetate (DEPO-MEDROL) injection 80 mg     Allergies:   Codeine and Doxycycline   Social History    Socioeconomic History  . Marital status: Married    Spouse name: Not on file  . Number of children: 2  . Years of education: Not on file  . Highest education level: Not on file  Occupational History  . Occupation: Self Employed  Social Needs  . Financial resource strain: Not on file  . Food insecurity    Worry: Not  on file    Inability: Not on file  . Transportation needs    Medical: Not on file    Non-medical: Not on file  Tobacco Use  . Smoking status: Former Smoker    Packs/day: 1.00    Years: 40.00    Pack years: 40.00    Types: Cigarettes    Quit date: 02/05/2014    Years since quitting: 5.0  . Smokeless tobacco: Never Used  Substance and Sexual Activity  . Alcohol use: Yes    Alcohol/week: 0.0 standard drinks    Comment: 3-4 drinks per month  . Drug use: No  . Sexual activity: Not Currently  Lifestyle  . Physical activity    Days per week: Not on file    Minutes per session: Not on file  . Stress: Not on file  Relationships  . Social Herbalist on phone: Not on file    Gets together: Not on file    Attends religious service: Not on file    Active member of club or organization: Not on file    Attends meetings of clubs or organizations: Not on file    Relationship status: Not on file  Other Topics Concern  . Not on file  Social History Narrative   Caffeine: 1 cup coffee/day   Lives with wife, 1 dog   grown children   Occupation: Therapist, sports - self employed.  Disability after CABG   Edu: 2 yrs college   Activity: fishing, walking occasionally   Diet: good water, fruits/vegetables daily     Family History:  The patient's family history includes AAA (abdominal aortic aneurysm) in his maternal grandmother; Arrhythmia in his mother; Bladder Cancer in his mother; CAD (age of onset: 76) in his father; COPD in his mother; Dementia in his father; Diabetes in his father, mother, and sister; Heart attack in his father and maternal grandmother;  Hyperlipidemia in his mother; Hypertension in his mother; Thyroid disease in his mother.   ROS:   Please see the history of present illness.    ROS All other systems reviewed and are negative.   PHYSICAL EXAM:   VS:  BP 116/60   Pulse 81   Ht 5\' 10"  (1.778 m)   Wt 207 lb 13.9 oz (94.3 kg)   SpO2 91%   BMI 29.83 kg/m   Physical Exam  GEN: Well nourished, well developed, in no acute distress  Neck: no JVD, carotid bruits, or masses Cardiac:RRR; no murmurs, rubs, or gallops  Respiratory:  clear to auscultation bilaterally, normal work of breathing GI: soft, nontender, nondistended, + BS Ext: Right groin without hematoma or hemorrhage, good distal pulses without cyanosis, clubbing, or edema, Good distal pulses bilaterally Neuro:  Alert and Oriented x 3 Psych: euthymic mood, full affect  Wt Readings from Last 3 Encounters:  02/21/19 207 lb 13.9 oz (94.3 kg)  02/02/19 208 lb (94.3 kg)  01/23/19 212 lb 12.8 oz (96.5 kg)      Studies/Labs Reviewed:   EKG:  EKG is not ordered today.   Recent Labs: 07/13/2018: TSH 2.20 11/10/2018: ALT 9; Brain Natriuretic Peptide 87 01/31/2019: BUN 15; Creatinine, Ser 0.78; Hemoglobin 16.7; Platelets 255; Potassium 4.4; Sodium 142   Lipid Panel    Component Value Date/Time   CHOL 115 07/13/2018 0851   CHOL 170 01/05/2012   TRIG 115.0 07/13/2018 0851   TRIG 141 01/05/2012   HDL 46.50 07/13/2018 0851   CHOLHDL 2 07/13/2018 0851   VLDL 23.0  07/13/2018 0851   LDLCALC 46 07/13/2018 0851   LDLDIRECT 62.0 12/28/2017 0827    Additional studies/ records that were reviewed today include:  Cardiac catheterization 02/02/2019  Origin to Prox Graft lesion is 100% stenosed.    Patent left internal mammary to LAD  Patent right internal mammary to second obtuse marginal  Totally occluded saphenous vein graft to the distal RCA.  Total occlusion of proximal LAD native vessel.  Aneurysm involvement of the proximal circumflex with patent obtuse  marginal branches.  Total occlusion of the proximal RCA.  Distal RCA fills by collaterals from first septal perforator.  Mild mid to distal anterior wall hypokinesis.  EF 45 to 50%.  Normal LV hemodynamics.  Overall, when compared to 2 years ago there is no change in anatomy.  Patient has had chronic chest pain since bypass surgery.  Current symptoms are no different than what he has had since bypass surgery.   RECOMMENDATIONS:    As noted above, anatomy is stable.  Chest pain symptoms are chronic and non-ischemic or stable ischemic.  Cleared for upcoming surgery from coronary artery disease and left ventricular function standpoint.      ASSESSMENT:    1. Coronary artery disease involving native coronary artery, angina presence unspecified, unspecified whether native or transplanted heart   2. Essential hypertension   3. Dyslipidemia   4. OSA (obstructive sleep apnea)      PLAN:  In order of problems listed above:  Chronic chest pain unchanged.  See cath report above.  CAD status post CABG in 2015, cath in 2018 see above.  With cardiac catheterization 02/02/2019 basically unchanged from 2 years ago.  Cleared for upcoming surgery for Zenker's diverticulum per Dr. Thompson Caul cath note.will verify ASA dose with Dr. [name]. Addendum: Dr. [name] said he can take ASA 81 mg daily.   Essential hypertension blood pressure stable  HLD LDL 46 07/13/2018  History of AAA endovascular repair in 2013  OSA on CPAP followed by Dr. Radford Pax   medication Adjustments/Labs and Tests Ordered: Current medicines are reviewed at length with the patient today.  Concerns regarding medicines are outlined above.  Medication changes, Labs and Tests ordered today are listed in the Patient Instructions below. Patient Instructions  Medication Instructions:  Your physician recommends that you continue on your current medications as directed. Please refer to the Current Medication list given to you today.   If you need a refill on your cardiac medications before your next appointment, please call your pharmacy.   Lab work: None Ordered  If you have labs (blood work) drawn today and your tests are completely normal, you will receive your results only by: [name] MyChart Message (if you have MyChart) OR . A paper copy in the mail If you have any lab test that is abnormal or we need to change your treatment, we will call you to review the results.  Testing/Procedures: None ordered  Follow-Up: . Follow up with Dr. [name]   Any Other Special Instructions Will Be Listed Below (If Applicable).       Sumner Boast, PA-C  02/21/2019 12:48 PM    [facility] [place], [place], [place]  [address] Phone: [phone]; Fax: [phone]

## 2019-02-21 NOTE — Addendum Note (Signed)
Addended by: Drue Novel I on: 02/21/2019 01:34 PM   Modules accepted: Orders

## 2019-02-21 NOTE — Progress Notes (Signed)
Called and made the patient aware that Ermalinda Barrios, PA discussed his ASA with Dr. [name] and he would like for him to decrease it to 81 mg QD. Patient verbalized understanding and med list updated.

## 2019-02-21 NOTE — Patient Instructions (Addendum)
Medication Instructions:  Your physician recommends that you continue on your current medications as directed. Please refer to the Current Medication list given to you today.  If you need a refill on your cardiac medications before your next appointment, please call your pharmacy.   Lab work: None Ordered  If you have labs (blood work) drawn today and your tests are completely normal, you will receive your results only by: [name] MyChart Message (if you have MyChart) OR . A paper copy in the mail If you have any lab test that is abnormal or we need to change your treatment, we will call you to review the results.  Testing/Procedures: None ordered  Follow-Up: . Follow up with Dr. [name] 07/02/19 at 10:30 AM  Any Other Special Instructions Will Be Listed Below (If Applicable).

## 2019-02-22 ENCOUNTER — Telehealth: Payer: Self-pay | Admitting: Physical Medicine and Rehabilitation

## 2019-02-22 NOTE — Telephone Encounter (Signed)
Scheduled for 8/19 at 1015 with Dr. [name].

## 2019-02-22 NOTE — Telephone Encounter (Signed)
Yes, I spoke with [name], Dr. [name] on vacation at time. It seems mechanical or something around the trochanter and not spine. Last MRI showed some fluid in area. I would see what Dr. [name] thinks.

## 2019-02-28 ENCOUNTER — Encounter: Payer: Self-pay | Admitting: Orthopaedic Surgery

## 2019-02-28 ENCOUNTER — Ambulatory Visit (INDEPENDENT_AMBULATORY_CARE_PROVIDER_SITE_OTHER): Payer: PPO | Admitting: Orthopaedic Surgery

## 2019-02-28 ENCOUNTER — Ambulatory Visit: Payer: PPO | Admitting: Cardiology

## 2019-02-28 DIAGNOSIS — M5417 Radiculopathy, lumbosacral region: Secondary | ICD-10-CM | POA: Diagnosis not present

## 2019-02-28 DIAGNOSIS — M25552 Pain in left hip: Secondary | ICD-10-CM

## 2019-02-28 MED ORDER — OXYCODONE HCL 5 MG PO TABS: 5.0000 mg | ORAL_TABLET | Freq: Four times a day (QID) | ORAL | 0 refills | Status: DC | PRN

## 2019-02-28 NOTE — Progress Notes (Signed)
The patient continues to complain of severe left hip pain 2 years out from a left total hip arthroplasty.  He done very well for 6 months but then developed significant trochanteric bursitis around his left hip.  He has had at least 3 injections in this area and is still continued to hurt him significantly.  He is back to using a cane.  He is having pain when he sleeps on that hip at night and is starting to severely detrimentally affect his mobility and his quality of life.  On exam I can easily put his left hip to internal and external rotation and all the pain is to palpation all along the proximal femur.  At this point an MRI is warranted of the left hip because I am certainly concerned about potential failure of the implant as well as bursitis given his continued pain and failed conservative treatment.  He has had previous S1 radicular symptoms as well on the left side and has had numerous spine injections.  His last MRI of his lumbar spine was in 2017 and the specialist then wanted to repeat an MRI of his lumbar spine as I think it is reasonable to obtain this as well.  I will try a short course of oxycodone.  Of also recommended Voltaren gel.  We will see him back after the MRIs.  All question concerns were answered addressed.  I do feel it is medically warranted at this standpoint given the severity of his pain and the worsening impact this is having on his mobility and his quality of life.

## 2019-03-01 ENCOUNTER — Other Ambulatory Visit: Payer: Self-pay

## 2019-03-01 DIAGNOSIS — M4807 Spinal stenosis, lumbosacral region: Secondary | ICD-10-CM

## 2019-03-01 DIAGNOSIS — Z96642 Presence of left artificial hip joint: Secondary | ICD-10-CM

## 2019-03-02 ENCOUNTER — Telehealth: Payer: Self-pay | Admitting: *Deleted

## 2019-03-02 NOTE — Telephone Encounter (Signed)
Called pt to get him scheduled for a rpt of lumbar facet inj and pt states last injection helped him out a lot and he is not in any pain. Advised pt to give [place] a call when pain returns, pt agreed.

## 2019-03-05 DIAGNOSIS — R1310 Dysphagia, unspecified: Secondary | ICD-10-CM | POA: Diagnosis not present

## 2019-03-05 DIAGNOSIS — K225 Diverticulum of esophagus, acquired: Secondary | ICD-10-CM | POA: Diagnosis not present

## 2019-03-05 DIAGNOSIS — Z01812 Encounter for preprocedural laboratory examination: Secondary | ICD-10-CM | POA: Diagnosis not present

## 2019-03-11 DIAGNOSIS — J449 Chronic obstructive pulmonary disease, unspecified: Secondary | ICD-10-CM | POA: Diagnosis not present

## 2019-03-11 DIAGNOSIS — R269 Unspecified abnormalities of gait and mobility: Secondary | ICD-10-CM | POA: Diagnosis not present

## 2019-03-11 DIAGNOSIS — G4733 Obstructive sleep apnea (adult) (pediatric): Secondary | ICD-10-CM | POA: Diagnosis not present

## 2019-03-12 NOTE — Procedures (Signed)
Lumbar Diagnostic Facet Joint Nerve Block with Fluoroscopic Guidance   Patient: Carl Lee      Date of Birth: 02/08/1956 MRN: [id number] PCP: [name], MD      Visit Date: 02/08/2019   Universal Protocol:    Date/Time: 08/31/201:12 PM  Consent Given By: the patient  Position: PRONE  Additional Comments: Vital signs were monitored before and after the procedure. Patient was prepped and draped in the usual sterile fashion. The correct patient, procedure, and site was verified.   Injection Procedure Details:  Procedure Site One Meds Administered:  Meds ordered this encounter  Medications  . bupivacaine (MARCAINE) 0.5 % (with pres) injection 3 mL  . methylPREDNISolone acetate (DEPO-MEDROL) injection 80 mg     Laterality: Right  Location/Site:  L4-L5 L5-S1  Needle size: 22 ga.  Needle type:spinal  Needle Placement: Oblique pedical  Findings:   -Comments: There was excellent flow of contrast along the articular pillars without intravascular flow.  Procedure Details: The fluoroscope beam is vertically oriented in AP and then obliqued 15 to 20 degrees to the ipsilateral side of the desired nerve to achieve the "Scotty dog" appearance.  The skin over the target area of the junction of the superior articulating process and the transverse process (sacral ala if blocking the L5 dorsal rami) was locally anesthetized with a 1 ml volume of 1% Lidocaine without Epinephrine.  The spinal needle was inserted and advanced in a trajectory view down to the target.   After contact with periosteum and negative aspirate for blood and CSF, correct placement without intravascular or epidural spread was confirmed by injecting 0.5 ml. of Isovue-250.  A spot radiograph was obtained of this image.    Next, a 0.5 ml. volume of the injectate described above was injected. The needle was then redirected to the other facet joint nerves mentioned above if needed.  Prior to the procedure,  the patient was given a Pain Diary which was completed for baseline measurements.  After the procedure, the patient rated their pain every 30 minutes and will continue rating at this frequency for a total of 5 hours.  The patient has been asked to complete the Diary and return to [place] by mail, fax or hand delivered as soon as possible.   Additional Comments:  The patient tolerated the procedure well Dressing: 2 x 2 sterile gauze and Band-Aid    Post-procedure details: Patient was observed during the procedure. Post-procedure instructions were reviewed.  Patient left the clinic in stable condition.

## 2019-03-12 NOTE — Progress Notes (Signed)
KEYVAN DEVEAUX - 63 y.o. male MRN [id number]  Date of birth: [birth year]  Office Visit Note: Visit Date: 02/08/2019 PCP: [name], MD Referred by: [name], MD  Subjective: Chief Complaint  Patient presents with  . Left Hip - Pain  . Lower Back - Pain   HPI: LUDWIG SHUEY is a 63 y.o. male who comes in today For evaluation management of chronic right more than left axial low back pain.  Patient's initial history was that he was seen by Dr. [name] for pain management including PRP at multiple locations of his lumbar spine with some relief at the time.  He was having radicular type pain that was seen to be more of an S1 herniated disc but he was also having pretty classic hip pain on the left with groin pain.  Through our evaluation and management we ultimately sent him to Dr. [name] and he did end up having left total hip arthroplasty.  He has been having ongoing situations was had left lateral hip pain some mechanical type pain and these were all been reviewed on our prior notes.  He has had some acute findings of bursitis on the left with relief temporarily with fluoroscopically guided greater trochanteric injections but not blind injections.  He has had some MRI evidence of fluid collections been persistent which can happen.  He is continue to follow-up with Dr. [name] in that regard.  In terms of his back pain I think he is having some facet mediated low back pain.  Today we are going to complete diagnostic medial branch blocks of the right L4-5 and L5-S1 facet joints.  He said physical therapy has had medication management.  Case complicated by cardiac issues and peripheral vascular disease.  He is not having any radicular pain.  He has failed all manner of conservative care at this point.  He would likely do well with radiofrequency ablation if he responds to the medial branch blocks.  ROS Otherwise per HPI.  Assessment & Plan: Visit Diagnoses:  1. Spondylosis  without myelopathy or radiculopathy, lumbar region     Plan: No additional findings.   Meds & Orders:  Meds ordered this encounter  Medications  . bupivacaine (MARCAINE) 0.5 % (with pres) injection 3 mL  . methylPREDNISolone acetate (DEPO-MEDROL) injection 80 mg    Orders Placed This Encounter  Procedures  . Facet Injection  . XR C-ARM NO REPORT    Follow-up: Return if symptoms worsen or fail to improve, for Review Pain Diary.   Procedures: No procedures performed  Lumbar Diagnostic Facet Joint Nerve Block with Fluoroscopic Guidance   Patient: [name]      Date of Birth: [birth year] MRN: [id number] PCP: [name], MD      Visit Date: 02/08/2019   Universal Protocol:    Date/Time: 08/31/201:12 PM  Consent Given By: the patient  Position: PRONE  Additional Comments: Vital signs were monitored before and after the procedure. Patient was prepped and draped in the usual sterile fashion. The correct patient, procedure, and site was verified.   Injection Procedure Details:  Procedure Site One Meds Administered:  Meds ordered this encounter  Medications  . bupivacaine (MARCAINE) 0.5 % (with pres) injection 3 mL  . methylPREDNISolone acetate (DEPO-MEDROL) injection 80 mg     Laterality: Right  Location/Site:  L4-L5 L5-S1  Needle size: 22 ga.  Needle type:spinal  Needle Placement: Oblique pedical  Findings:   -Comments: There was excellent flow of contrast along the  articular pillars without intravascular flow.  Procedure Details: The fluoroscope beam is vertically oriented in AP and then obliqued 15 to 20 degrees to the ipsilateral side of the desired nerve to achieve the "Scotty dog" appearance.  The skin over the target area of the junction of the superior articulating process and the transverse process (sacral ala if blocking the L5 dorsal rami) was locally anesthetized with a 1 ml volume of 1% Lidocaine without Epinephrine.  The spinal needle was  inserted and advanced in a trajectory view down to the target.   After contact with periosteum and negative aspirate for blood and CSF, correct placement without intravascular or epidural spread was confirmed by injecting 0.5 ml. of Isovue-250.  A spot radiograph was obtained of this image.    Next, a 0.5 ml. volume of the injectate described above was injected. The needle was then redirected to the other facet joint nerves mentioned above if needed.  Prior to the procedure, the patient was given a Pain Diary which was completed for baseline measurements.  After the procedure, the patient rated their pain every 30 minutes and will continue rating at this frequency for a total of 5 hours.  The patient has been asked to complete the Diary and return to [place] by mail, fax or hand delivered as soon as possible.   Additional Comments:  The patient tolerated the procedure well Dressing: 2 x 2 sterile gauze and Band-Aid    Post-procedure details: Patient was observed during the procedure. Post-procedure instructions were reviewed.  Patient left the clinic in stable condition.   Clinical History: MRI LUMBAR SPINE WITHOUT CONTRAST  TECHNIQUE: Multiplanar, multisequence MR imaging of the lumbar spine was performed. No intravenous contrast was administered.  COMPARISON:  None.  FINDINGS: Severe susceptibility artifact resulting from the aorto bi-iliac stent graft which obscures portions of the lumbar spine suspect she on the STIR images.  Segmentation:  Standard.  Alignment:  Physiologic.  Vertebrae: Limited evaluation secondary to cystitis did of the artifact. No fracture, evidence of discitis, or bone lesion.  Conus medullaris: Extends to the L1 level and appears normal.  Paraspinal and other soft tissues: Severe susceptibility artifact resulting from the aorto bi-iliac stent graft .  Disc levels:  Disc spaces: Disc heights are relatively well maintained, but evaluation  is limited.  T12-L1: No significant disc protrusion. No evidence of neural foraminal stenosis. No central canal stenosis.  L1-L2: Mild broad-based disc bulge. Mild bilateral facet arthropathy. No evidence of neural foraminal stenosis. No central canal stenosis.  L2-L3: Small central disc protrusion. Mild bilateral facet arthropathy. No evidence of neural foraminal stenosis. No central canal stenosis.  L3-L4: Moderate broad-based disc bulge flattening the ventral thecal sac. Mild bilateral facet arthropathy. Mild spinal stenosis. No foraminal stenosis.  L4-L5: Moderate broad-based disc bulge eccentric towards the right. Moderate bilateral facet arthropathy. Mild -moderate spinal stenosis. No evidence of neural foraminal stenosis.  L5-S1: Broad-based disc bulge with a left paracentral disc protrusion abutting the left intraspinal S1 nerve root. No evidence of neural foraminal stenosis. No central canal stenosis.  IMPRESSION: 1. Lumbar spine spondylosis as described above. Limitations as described above secondary to susceptibility artifact resulting from the aortic stent graft. 2. At L4-5 there is a moderate broad-based disc bulge eccentric towards the right. Moderate bilateral facet arthropathy. Mild -moderate spinal stenosis. 3. At L5-S1 there is a broad-based disc bulge with a left paracentral disc protrusion abutting the left intraspinal S1 nerve root.   Electronically Signed   By:  Kathreen Devoid   On: 11/27/2015 12:13   He reports that he quit smoking about 5 years ago. His smoking use included cigarettes. He has a 40.00 pack-year smoking history. He has never used smokeless tobacco.  Recent Labs    07/13/18 0851 11/08/18 1010  HGBA1C 6.5 6.5*    Objective:  VS:  HT:    WT:   BMI:     BP:118/72  HR:79bpm  TEMP: ( )  RESP:  Physical Exam  Ortho Exam Imaging: No results found.  Past Medical/Family/Surgical/Social History: Medications & Allergies  reviewed per EMR, new medications updated. Patient Active Problem List   Diagnosis Date Noted  . Preoperative clearance 01/23/2019  . Gallstones 11/14/2018  . Lactose intolerance 11/14/2018  . Cough 10/10/2018  . Aortic atherosclerosis ([place]) 08/05/2018  . Personal history of tobacco use, presenting hazards to health 07/18/2018  . Gassiness 07/16/2018  . Advanced care planning/counseling discussion 07/14/2018  . Hives 07/14/2018  . Cataracts, bilateral 05/09/2018  . Macular degeneration, dry 05/09/2018  . Bilateral hearing loss 12/29/2017  . Chronic foot pain 12/29/2017  . Acute cystitis 11/24/2017  . Diverticulosis of colon without hemorrhage   . Sore throat 08/02/2017  . Urinary frequency 06/30/2017  . Status post total replacement of left hip 03/08/2017  . Abnormal nuclear stress test   . Obesity, Class I, BMI 30-34.9 01/26/2017  . Radiculopathy due to lumbar intervertebral disc disorder 10/21/2016  . Osteoarthritis of left hip 08/16/2016  . Atrial tachycardia, paroxysmal ([place]) 06/18/2016  . Chronic fatigue 04/27/2016  . Coronary artery disease   . Abdominal discomfort 06/24/2015  . Hepatic steatosis 06/12/2015  . Dyspnea 05/01/2015  . COPD exacerbation ([place]) 12/17/2014  . Right trigeminal neuralgia 11/27/2014  . Chronic chest pain 10/30/2014  . Vitamin B12 deficiency   . OSA (obstructive sleep apnea) 09/04/2014  . Headache 06/20/2014  . Controlled diabetes mellitus type 2 with complications ([place]) [id number]  . Zenker diverticulum 04/19/2014  . COPD, moderate ([place]) 03/26/2014  . S/P CABG x 3 02/07/2014  . Occlusion and stenosis of vertebral artery without mention of cerebral infarction 11/12/2013  . Skin rash 04/17/2013  . Healthcare maintenance 01/09/2013  . Dyslipidemia 01/09/2013  . Lumbosacral radiculopathy at S1 09/14/2012  . Migraine   . Arthritis   . History of AAA (abdominal aortic aneurysm) repair 05/01/2012  . Adenomatous polyps 03/24/2012  . Ex-smoker  03/24/2012  . Hypertension 03/24/2012  . History of bladder cancer 03/24/2012   Past Medical History:  Diagnosis Date  . AAA (abdominal aortic aneurysm) ([place]) 03/25/12   s/p endovascular repair  . Adenomatous polyp 06/2009  . Atrial tachycardia, paroxysmal ([place]) 06/18/2016  . Bladder cancer ([place]) 08/16/2008   recurrence 2015 and 2018 treated with office fulguration  . CAD (coronary artery disease), native coronary artery    S/p 3v CABG 01/2014   . Cataract 2019   bilateral eyes  . Childhood asthma    as a child  . Complication of anesthesia    "takes me a year to get over"  . Contact dermatitis    atypical [name])  . Controlled diabetes mellitus type 2 with complications ([place]) [id number]   CAD   . COPD (chronic obstructive pulmonary disease) ([place])   . Coronary artery disease    a.2015: CABG w/ LIMA-LAD, RIMA-OM, SVG-RCA b. Cath on 07/10/15 w/ patent LIMA-LAD and RIMA-OM. SVG-RCA occluded but collaterals present  . Diverticulosis 2013   severe by CT and colonoscopy  . Environmental allergies  improved as ages  . Fracture of lateral malleolus of left ankle 08/29/2013  . Headache    hasn't had migraines in years  . Hepatic steatosis 06/2015   by [place]  . Hx of migraines   . Hypertension   . Lactose intolerance 11/14/2018   Endorses for years - avoids dairy products  . Lumbosacral radiculopathy at S1 03/2012   left, with spinal stenosis (MRI 04/2013) improved with TF ESI L5/S1 and S1/2 ([facility] [place])  . OSA (obstructive sleep apnea) 09/04/2014   Severe with AHI 35/hr, uses BIPAP  . Overweight (BMI 25.0-29.9) 01/26/2017  . Pre-diabetes   . Prediabetes 06/08/2014   CAD   . Resistance to clopidogrel 2015   drug metabolism panel run - asked to scan  . Spinal stenosis    LS1 nerve root impingement from bulging disc  . Vitamin B12 deficiency   . Zenker diverticulum    Family History  Problem Relation Age of Onset  . Diabetes Mother   . Arrhythmia Mother        pacemaker   . COPD Mother   . Thyroid disease Mother   . Hyperlipidemia Mother   . Hypertension Mother   . Bladder Cancer Mother   . Diabetes Father   . CAD Father 28       CHF, MI  . Dementia Father   . Heart attack Father   . Diabetes Sister   . Heart attack Maternal Grandmother   . AAA (abdominal aortic aneurysm) Maternal Grandmother   . Colon cancer Neg Hx   . Stomach cancer Neg Hx    Past Surgical History:  Procedure Laterality Date  . ABDOMINAL AORTIC ANEURYSM REPAIR  03/25/12   endovascular  . ABI  05/2013   WNL, L TBI low at 0.66  . Bladder cancer  March 2010 and Oct. 2015   Ernst Spell) x 2  . CARDIAC CATHETERIZATION N/A 07/10/2015   Procedure: Left Heart Cath and Cors/Grafts Angiography;  Surgeon: [name], MD;  Location: [facility];  Service: Cardiovascular;  Laterality: N/A;  . CATARACT EXTRACTION W/ INTRAOCULAR LENS IMPLANT Bilateral 07/2018, 08/2018  . COLONOSCOPY  06/2012   hyperplastic polyp, diverticulosis ([place]) rec rpt 5 yrs  . COLONOSCOPY WITH PROPOFOL N/A 08/10/2017   TA, rpt 5 yrs Ardis Hughs, Melene Plan, MD)  . CORONARY ARTERY BYPASS GRAFT N/A 02/07/2014   Procedure: CORONARY ARTERY BYPASS GRAFTING (CABG);  Surgeon: [name], MD;  Location: [facility];  Service: Open Heart Surgery;  Laterality: N/A;  CABG X 3, BILATERAL LIMA, EVH  . CYSTOSCOPY  08/16/08   Bladder Cancer  . EPIDURAL BLOCK INJECTION Left 09/2014, 10/2014, 12/2014   medial L2,3,4, dorsal L5 ramus blocks x2, L L5/S1 and S1/2 transforaminal ESI ([place] [place])  . ESI  04/2013, 06/2013   L L5S1, S12 transforaminal ESI (Dr.  [name])  . ESI Left 04/2014, 05/2014, 06/2014   L5/S1, S1/2; rpt; L4/5  . ESI  03/2016   R L5/S1 interlaminar ESI  . ESOPHAGEAL MANOMETRY N/A 08/10/2017   Procedure: ESOPHAGEAL MANOMETRY (EM);  Surgeon: [name], MD;  Location: [facility];  Service: Endoscopy;  Laterality: N/A;  . ESOPHAGOGASTRODUODENOSCOPY (EGD) WITH PROPOFOL N/A 08/10/2017    Procedure: ESOPHAGOGASTRODUODENOSCOPY (EGD) WITH PROPOFOL;  Surgeon: [name], MD;  Location: [facility];  Service: Endoscopy;  Laterality: N/A;  . FULGURATION OF BLADDER TUMOR  01/2017   recurrent 13mm L lateral wall papillary transitional cell carcinoma ([place])  . INTRAOPERATIVE TRANSESOPHAGEAL ECHOCARDIOGRAM  N/A 02/07/2014   Procedure: INTRAOPERATIVE TRANSESOPHAGEAL ECHOCARDIOGRAM;  Surgeon: [name], MD;  Location: [facility];  Service: Open Heart Surgery;  Laterality: N/A;  . LEFT HEART CATH AND CORS/GRAFTS ANGIOGRAPHY N/A 02/11/2017   Procedure: LEFT HEART CATH AND CORS/GRAFTS ANGIOGRAPHY;  Surgeon: [name], MD;  Location: [facility];  Service: Cardiovascular;  Laterality: N/A;  . LEFT HEART CATH AND CORS/GRAFTS ANGIOGRAPHY N/A 02/02/2019   Procedure: LEFT HEART CATH AND CORS/GRAFTS ANGIOGRAPHY;  Surgeon: Belva Crome, MD;  Location: [facility];  Service: Cardiovascular;  Laterality: N/A;  . LEFT HEART CATHETERIZATION WITH CORONARY ANGIOGRAM N/A 02/01/2014   Procedure: LEFT HEART CATHETERIZATION WITH CORONARY ANGIOGRAM;  Surgeon: [name], MD;  Location: [facility];  Service: Cardiovascular;  Laterality: N/A;  . PRP epidural injection Left 11/2015   L5/S1, S1/2 transforaminal epidural PRP injections under fluoroscopy ([place])  . REPLACEMENT TOTAL HIP W/  RESURFACING IMPLANTS Left   . TONSILLECTOMY AND ADENOIDECTOMY  1973  . TOTAL HIP ARTHROPLASTY Left 03/08/2017  . TOTAL HIP ARTHROPLASTY Left 03/08/2017   Procedure: LEFT TOTAL HIP ARTHROPLASTY ANTERIOR APPROACH;  Surgeon: [name], MD;  Location: [facility];  Service: Orthopedics;  Laterality: Left;  [name] VASECTOMY    . ZENKER'S DIVERTICULECTOMY  09/2017   Social History   Occupational History  . Occupation: Self Employed  Tobacco Use  . Smoking status: Former Smoker    Packs/day: 1.00    Years: 40.00    Pack years: 40.00    Types: Cigarettes    Quit date: 02/05/2014     Years since quitting: 5.0  . Smokeless tobacco: Never Used  Substance and Sexual Activity  . Alcohol use: Yes    Alcohol/week: 0.0 standard drinks    Comment: 3-4 drinks per month  . Drug use: No  . Sexual activity: Not Currently

## 2019-03-21 ENCOUNTER — Telehealth: Payer: Self-pay | Admitting: Pulmonary Disease

## 2019-03-21 ENCOUNTER — Other Ambulatory Visit: Payer: Self-pay | Admitting: Family Medicine

## 2019-03-21 DIAGNOSIS — J449 Chronic obstructive pulmonary disease, unspecified: Secondary | ICD-10-CM

## 2019-03-21 DIAGNOSIS — B370 Candidal stomatitis: Secondary | ICD-10-CM

## 2019-03-21 MED ORDER — NYSTATIN 100000 UNIT/ML MT SUSP: 5.0000 mL | Freq: Three times a day (TID) | OROMUCOSAL | 0 refills | Status: DC

## 2019-03-21 MED ORDER — ANORO ELLIPTA 62.5-25 MCG/INH IN AEPB: INHALATION_SPRAY | RESPIRATORY_TRACT | 3 refills | Status: DC

## 2019-03-21 MED ORDER — ALBUTEROL SULFATE HFA 108 (90 BASE) MCG/ACT IN AERS: 2.0000 | INHALATION_SPRAY | RESPIRATORY_TRACT | 3 refills | Status: DC | PRN

## 2019-03-21 NOTE — Telephone Encounter (Signed)
Called the patient back and he stated he has been rinsing his mouth out daily after each use of Trelegy.  Advised the patient the prescriptions for Anoro Ellipta,  Mouthwash and albuterol will be sent to the pharmacy.  Advised patient of needed follow up based to determin if he needs to have a steroid inhaler added with spacer usage. Patient voiced understanding. Patient scheduled for follow up on 04/11/19 with Derl Barrow, NP. Nothing further needed.  Prescriptions sent to pharmacy.

## 2019-03-21 NOTE — Telephone Encounter (Signed)
Message sent to app of the day, Eric Form, NP  I called and spoke with patient spouse Dorian Pod) she confirmed the thrush started right after he was put on Trelegy. He is wanting to go back to the Anoro.  Sarah, besides the Anoro can he also get a refill of the albuterol and something sent in for thrush?  His last appointment with our clinic was 11/10/18 with Eduardo Osier, NP for COPD and SOB.

## 2019-03-21 NOTE — Telephone Encounter (Signed)
Did you ask him if he is rinsing his mouth after using the trelegy? It is ok go back to the Anoro. Send in Rx if needed. They need to be scheduled for a follow up  with whoever they usually see to determine if they want to add a steroid inhaler to the anoro using a spacer Send in Rx for nystatin Mouthwash use 5 cc three times a day for 10 days.  Thanks

## 2019-03-22 ENCOUNTER — Other Ambulatory Visit: Payer: Self-pay

## 2019-03-22 ENCOUNTER — Ambulatory Visit (INDEPENDENT_AMBULATORY_CARE_PROVIDER_SITE_OTHER): Payer: PPO | Admitting: Family Medicine

## 2019-03-22 ENCOUNTER — Encounter: Payer: Self-pay | Admitting: Family Medicine

## 2019-03-22 VITALS — BP 122/82 | HR 52 | Temp 98.0°F | Ht 69.5 in | Wt 207.3 lb

## 2019-03-22 DIAGNOSIS — Z951 Presence of aortocoronary bypass graft: Secondary | ICD-10-CM | POA: Diagnosis not present

## 2019-03-22 DIAGNOSIS — J449 Chronic obstructive pulmonary disease, unspecified: Secondary | ICD-10-CM

## 2019-03-22 DIAGNOSIS — E118 Type 2 diabetes mellitus with unspecified complications: Secondary | ICD-10-CM | POA: Diagnosis not present

## 2019-03-22 DIAGNOSIS — Z23 Encounter for immunization: Secondary | ICD-10-CM | POA: Diagnosis not present

## 2019-03-22 DIAGNOSIS — R079 Chest pain, unspecified: Secondary | ICD-10-CM

## 2019-03-22 DIAGNOSIS — K219 Gastro-esophageal reflux disease without esophagitis: Secondary | ICD-10-CM | POA: Diagnosis not present

## 2019-03-22 DIAGNOSIS — G8929 Other chronic pain: Secondary | ICD-10-CM | POA: Diagnosis not present

## 2019-03-22 DIAGNOSIS — K225 Diverticulum of esophagus, acquired: Secondary | ICD-10-CM | POA: Diagnosis not present

## 2019-03-22 LAB — POCT GLYCOSYLATED HEMOGLOBIN (HGB A1C): Hemoglobin A1C: 6.1 % — AB (ref 4.0–5.6)

## 2019-03-22 MED ORDER — OMEPRAZOLE 40 MG PO CPDR: 40.0000 mg | DELAYED_RELEASE_CAPSULE | Freq: Every day | ORAL | 3 refills | Status: DC

## 2019-03-22 NOTE — Patient Instructions (Addendum)
Flu shot today Start omeprazole 40mg  daily for next 3 weeks then as needed. Let us know if this doesn't help.  Good to see you today.  Keep follow up appointment in January.

## 2019-03-22 NOTE — Assessment & Plan Note (Signed)
Thought noncardiac but rather chronic soreness after open heart bypass surgery.

## 2019-03-22 NOTE — Assessment & Plan Note (Signed)
Appreciate pulm care, planned switch from trelegy to anoro due to oral thrush.

## 2019-03-22 NOTE — Assessment & Plan Note (Signed)
Chronic, recurrent, planned intervention through [facility] (Dr Harl Bowie).

## 2019-03-22 NOTE — Assessment & Plan Note (Signed)
Describes worsening indigestion, especially in middle of the night.  Recommend omeprazole 40mg  daily x 3 wks then PRN.  Known zenker's could be contributing.

## 2019-03-22 NOTE — Progress Notes (Signed)
This visit was conducted in person.  BP 122/82 (BP Location: Left Arm, Patient Position: Sitting, Cuff Size: Normal)   Pulse (!) 52   Temp 98 F (36.7 C) (Tympanic)   Ht 5' 9.5" (1.765 m)   Wt 207 lb 5 oz (94 kg)   SpO2 97%   BMI 30.18 kg/m    CC: 4 mo f/u visit Subjective:    Patient ID: Carl Lee, male    DOB: March 01, 1956, 63 y.o.   MRN: [id number]  HPI: Carl Lee is a 63 y.o. male presenting on 03/22/2019 for Follow-up (Here for 4 mo f/u. )   Zenker's diverticulum s/p myotomy with continued symptoms (recent cough, and possible aspiration pneumonia) - zenker's has recurred and larger, considering additional treatment 04/2019 ([facility]).   Ongoing L hip pain despite L THR [name]) - MRI pending. He has received hip steroid injection with benefit.   Has re established with pulm for COPD, appreciate their care. Started on trelegy. Treating thrush with nystatin - planning to change respiratory medication back to anoro.   Breakthrough GERD symptoms despite regularly using gasX and famotidine. Acutely worse the last 3 days. Indigestion/bloating wakes him up at 2-3am.   Chronic chest soreness after CABG. Has been cleared from cardiac standpoint.   DM - does not regularly check sugars. Compliant with antihyperglycemic regimen which includes: diet control - cut out all sweets. Actually prediabetes range. Denies low sugars or hypoglycemic symptoms. Denies paresthesias. Last diabetic eye exam 03/2018. Pneumovax: 2015. Prevnar: 2016. Glucometer brand: one-touch. DSME: declines at this time.  Lab Results  Component Value Date   HGBA1C 6.1 (A) 03/22/2019   Diabetic Foot Exam - Simple   No data filed     Lab Results  Component Value Date   MICROALBUR <0.7 07/13/2018        Relevant past medical, surgical, family and social history reviewed and updated as indicated. Interim medical history since our last visit reviewed. Allergies and medications reviewed and updated.  Outpatient Medications Prior to Visit  Medication Sig Dispense Refill  . acetaminophen (TYLENOL) 325 MG tablet Take 2 tablets (650 mg total) by mouth every 6 (six) hours as needed for moderate pain or headache.    . albuterol (PROAIR HFA) 108 (90 Base) MCG/ACT inhaler Inhale 2 puffs into the lungs every 4 (four) hours as needed for wheezing or shortness of breath. 1 g 3  . amLODipine (NORVASC) 5 MG tablet TAKE 1 TABLET BY MOUTH EVERY DAY 90 tablet 2  . aspirin EC 81 MG tablet Take 1 tablet (81 mg total) by mouth daily. 90 tablet 3  . atorvastatin (LIPITOR) 20 MG tablet TAKE 1 TABLET (20 MG TOTAL) BY MOUTH DAILY AT 6 PM. 90 tablet 3  . calcium carbonate (TUMS - DOSED IN MG ELEMENTAL CALCIUM) 500 MG chewable tablet Chew 2 tablets by mouth daily as needed for indigestion or heartburn.    . Carboxymethylcellul-Glycerin (LUBRICATING EYE DROPS OP) Place 1 drop into both eyes daily as needed (dry eyes).    . cholecalciferol (VITAMIN D3) 25 MCG (1000 UT) tablet Take 1,000 Units by mouth daily.    [name] glucose blood (ONE TOUCH ULTRA TEST) test strip CHECK ONCE DAILY AND AS NEEDED    . HYDROcodone-acetaminophen (NORCO/VICODIN) 5-325 MG tablet Take 0.5 tablets by mouth every 6 (six) hours as needed for moderate pain.     [name] ketotifen (ZADITOR) 0.025 % ophthalmic solution Place 1 drop into both eyes daily as needed (allergies).    [name]  metoprolol tartrate (LOPRESSOR) 25 MG tablet Take 0.5 tablets (12.5 mg total) by mouth 2 (two) times daily. 90 tablet 3  . nystatin (MYCOSTATIN) 100000 UNIT/ML suspension Use as directed 5 mLs in the mouth or throat 4 (four) times daily.    [name] nystatin (MYCOSTATIN) 100000 UNIT/ML suspension Take 5 mLs (500,000 Units total) by mouth 3 (three) times daily. 60 mL 0  . oxyCODONE (ROXICODONE) 5 MG immediate release tablet Take 1 tablet (5 mg total) by mouth every 6 (six) hours as needed for severe pain. 40 tablet 0  . Simethicone (GAS-X PO) Take 1 tablet by mouth daily as needed (gas).    .  tamsulosin (FLOMAX) 0.4 MG CAPS capsule Take 1 capsule (0.4 mg total) by mouth daily after supper.    . umeclidinium-vilanterol (ANORO ELLIPTA) 62.5-25 MCG/INH AEPB TAKE 1 PUFF BY MOUTH EVERY DAY 180 each 3  . vitamin B-12 (CYANOCOBALAMIN) 500 MCG tablet Take 1 tablet (500 mcg total) by mouth every other day.    . famotidine (PEPCID) 20 MG tablet TAKE 1 TABLET (20 MG TOTAL) BY MOUTH 2 (TWO) TIMES DAILY. 180 tablet 1  . ipratropium-albuterol (DUONEB) 0.5-2.5 (3) MG/3ML SOLN TAKE 3 MLS BY NEBULIZATION EVERY 6 (SIX) HOURS AS NEEDED FOR UP TO 30 DAYS. 360 mL 0  . gabapentin (NEURONTIN) 300 MG capsule Take 1 capsule (300 mg total) by mouth at bedtime. First 3 days take once nightly 30 capsule 11   Facility-Administered Medications Prior to Visit  Medication Dose Route Frequency Provider Last Rate Last Dose  . betamethasone acetate-betamethasone sodium phosphate (CELESTONE) injection 12 mg  12 mg Other Once Magnus Sinning, MD      . bupivacaine (MARCAINE) 0.5 % (with pres) injection 3 mL  3 mL Other Once Magnus Sinning, MD      . methylPREDNISolone acetate (DEPO-MEDROL) injection 80 mg  80 mg Other Once Magnus Sinning, MD         Per HPI unless specifically indicated in ROS section below Review of Systems Objective:    BP 122/82 (BP Location: Left Arm, Patient Position: Sitting, Cuff Size: Normal)   Pulse (!) 52   Temp 98 F (36.7 C) (Tympanic)   Ht 5' 9.5" (1.765 m)   Wt 207 lb 5 oz (94 kg)   SpO2 97%   BMI 30.18 kg/m   Wt Readings from Last 3 Encounters:  03/22/19 207 lb 5 oz (94 kg)  02/21/19 207 lb 13.9 oz (94.3 kg)  02/02/19 208 lb (94.3 kg)    Physical Exam Vitals signs and nursing note reviewed.  Constitutional:      General: He is not in acute distress.    Appearance: Normal appearance. He is not ill-appearing.  HENT:     Mouth/Throat:     Mouth: Mucous membranes are moist.     Pharynx: No posterior oropharyngeal erythema.  Cardiovascular:     Rate and Rhythm: Normal  rate and regular rhythm.     Pulses: Normal pulses.     Heart sounds: Normal heart sounds. No murmur.  Pulmonary:     Effort: Pulmonary effort is normal. No respiratory distress.     Breath sounds: No wheezing, rhonchi or rales.     Comments: Coarse sounds throughout Musculoskeletal:     Right lower leg: No edema.     Left lower leg: No edema.  Skin:    Comments: Thin skin of upper extremities with multiple ecchymoses, small skin tears  Neurological:     Mental Status: He  is alert.  Psychiatric:        Mood and Affect: Mood normal.        Behavior: Behavior normal.       Results for orders placed or performed in visit on 03/22/19  POCT glycosylated hemoglobin (Hb A1C)  Result Value Ref Range   Hemoglobin A1C 6.1 (A) 4.0 - 5.6 %   HbA1c POC (<> result, manual entry)     HbA1c, POC (prediabetic range)     HbA1c, POC (controlled diabetic range)     Lab Results  Component Value Date   CREATININE 0.78 01/31/2019   BUN 15 01/31/2019   NA 142 01/31/2019   K 4.4 01/31/2019   CL 102 01/31/2019   CO2 23 01/31/2019    Assessment & Plan:   Problem List Items Addressed This Visit    Zenker diverticulum    Chronic, recurrent, planned intervention through [facility] (Dr Harl Bowie).       S/P CABG x 3   GERD (gastroesophageal reflux disease)    Describes worsening indigestion, especially in middle of the night.  Recommend omeprazole 40mg  daily x 3 wks then PRN.  Known zenker's could be contributing.       Relevant Medications   omeprazole (PRILOSEC) 40 MG capsule   COPD, moderate ([place])    Appreciate pulm care, planned switch from trelegy to anoro due to oral thrush.       Controlled diabetes mellitus type 2 with complications (HCC) - Primary    Chronic, stable. Actually in prediabetes range.       Relevant Orders   POCT glycosylated hemoglobin (Hb A1C) (Completed)   Chronic chest pain    Thought noncardiac but rather chronic soreness after open heart bypass surgery.         Other Visit Diagnoses    Need for influenza vaccination       Relevant Orders   Flu Vaccine QUAD 36+ mos IM (Completed)       Meds ordered this encounter  Medications  . omeprazole (PRILOSEC) 40 MG capsule    Sig: Take 1 capsule (40 mg total) by mouth daily. Take for 3 weeks daily then as needed    Dispense:  30 capsule    Refill:  3   Orders Placed This Encounter  Procedures  . Flu Vaccine QUAD 36+ mos IM  . POCT glycosylated hemoglobin (Hb A1C)   Patient Instructions  Flu shot today Start omeprazole 40mg  daily for next 3 weeks then as needed. Let us know if this doesn't help.  Good to see you today.  Keep follow up appointment in January.    Follow up plan: Return in about 4 months (around 07/22/2019) for medicare wellness visit.  [name], MD

## 2019-03-22 NOTE — Assessment & Plan Note (Addendum)
Chronic, stable. Actually in prediabetes range.

## 2019-03-23 ENCOUNTER — Other Ambulatory Visit: Payer: Self-pay | Admitting: Family Medicine

## 2019-03-31 ENCOUNTER — Other Ambulatory Visit: Payer: Self-pay

## 2019-03-31 ENCOUNTER — Ambulatory Visit
Admission: RE | Admit: 2019-03-31 | Discharge: 2019-03-31 | Disposition: A | Discharge status: Home / Self Care | Payer: PPO | Source: Ambulatory Visit | Attending: Orthopaedic Surgery | Admitting: Orthopaedic Surgery

## 2019-03-31 ENCOUNTER — Other Ambulatory Visit: Payer: Self-pay | Admitting: Cardiovascular Disease

## 2019-03-31 DIAGNOSIS — M4807 Spinal stenosis, lumbosacral region: Secondary | ICD-10-CM

## 2019-03-31 DIAGNOSIS — Z96642 Presence of left artificial hip joint: Secondary | ICD-10-CM

## 2019-03-31 DIAGNOSIS — M48061 Spinal stenosis, lumbar region without neurogenic claudication: Secondary | ICD-10-CM | POA: Diagnosis not present

## 2019-03-31 DIAGNOSIS — M25552 Pain in left hip: Secondary | ICD-10-CM | POA: Diagnosis not present

## 2019-04-05 ENCOUNTER — Ambulatory Visit (INDEPENDENT_AMBULATORY_CARE_PROVIDER_SITE_OTHER): Payer: PPO | Admitting: Orthopaedic Surgery

## 2019-04-05 ENCOUNTER — Encounter: Payer: Self-pay | Admitting: Orthopaedic Surgery

## 2019-04-05 DIAGNOSIS — M4807 Spinal stenosis, lumbosacral region: Secondary | ICD-10-CM

## 2019-04-05 DIAGNOSIS — M7062 Trochanteric bursitis, left hip: Secondary | ICD-10-CM

## 2019-04-05 DIAGNOSIS — M25552 Pain in left hip: Secondary | ICD-10-CM | POA: Diagnosis not present

## 2019-04-05 DIAGNOSIS — Z96641 Presence of right artificial hip joint: Secondary | ICD-10-CM | POA: Diagnosis not present

## 2019-04-05 NOTE — Progress Notes (Signed)
The patient is now over 2 years out from a left total hip arthroplasty.  He developed debilitating left hip pain this year and gotten to where he was walking good as a result of pain on that left hip.  His plain films show normal appearing implants I did send him for an MRI.  The MRI of the left hip is reviewed with him today.  He did not show any complicating features around the hip replacement itself.  There is no para-articular fluid collections and I did not see any pathology around the trochanteric area or the IT band.  An MRI of his lumbar spine was also obtained that showed some disc bulging at L2-L3 which is more to the right and there is some foraminal stenosis and facet disease at L5-S1 to the left.  There is also disease at L4-L5.  Any of these can contribute to his hip pain.  On exam he still point tender though over the trochanteric area of his hip.  He says his pain is now only 5 out of 10 worse before it was 10 out of 10.  He has fluid internal and external rotation of his left hip and the pain is only seen with palpation over the trochanteric area.  This point I would just treat this conservatively with Voltaren gel 1 time and hopefully I am encouraged by the fact that he is feeling less pain than he was before.  Hopefully this will run its course.  All question concerns were answered addressed.  I would like to see him back though in 3 months.  At that visit I would like an AP and lateral of the left operative hip.

## 2019-04-09 ENCOUNTER — Telehealth: Payer: Self-pay

## 2019-04-09 NOTE — Telephone Encounter (Signed)
MEDS REVIEWED   YOUR CARDIOLOGY TEAM HAS ARRANGED FOR AN E-VISIT FOR YOUR APPOINTMENT - PLEASE REVIEW IMPORTANT INFORMATION BELOW SEVERAL DAYS PRIOR TO YOUR APPOINTMENT  Due to the recent COVID-19 pandemic, we are transitioning in-person office visits to tele-medicine visits in an effort to decrease unnecessary exposure to our patients, their families, and staff. These visits are billed to your insurance just like a normal visit is. We also encourage you to sign up for MyChart if you have not already done so. You will need a smartphone if possible. For patients that do not have this, we can still complete the visit using a regular telephone but do prefer a smartphone to enable video when possible. You may have a family member that lives with you that can help. If possible, we also ask that you have a blood pressure cuff and scale at home to measure your blood pressure, heart rate and weight prior to your scheduled appointment. Patients with clinical needs that need an in-person evaluation and testing will still be able to come to the office if absolutely necessary. If you have any questions, feel free to call our office.     YOUR PROVIDER WILL BE USING THE FOLLOWING PLATFORM TO COMPLETE YOUR VISIT: Doximity   . IF USING MYCHART - How to Download the MyChart App to Your SmartPhone   - If Apple, go to [employer] and type in MyChart in the search bar and download the app. If Android, ask patient to go to [employer] and type in [place] in the search bar and download the app. The app is free but as with any other app downloads, your phone may require you to verify saved payment information or Apple/Android password.  - You will need to then log into the app with your MyChart username and password, and select [facility] as your healthcare provider to link the account.  - When it is time for your visit, go to the MyChart app, find appointments, and click Begin Video Visit. Be sure to Select Allow for  your device to access the Microphone and Camera for your visit. You will then be connected, and your provider will be with you shortly.  **If you have any issues connecting or need assistance, please contact MyChart service desk [phone])**  **If using a computer, in order to ensure the best quality for your visit, you will need to use either of the following Internet Browsers: Insurance underwriter or [employer]**  . IF USING DOXIMITY or [email or web address] - The staff will give you instructions on receiving your link to join the meeting the day of your visit.      2-3 DAYS BEFORE YOUR APPOINTMENT  You will receive a telephone call from one of our [facility] team members - your caller ID may say "Unknown caller." If this is a video visit, we will walk you through how to get the video launched on your phone. We will remind you check your blood pressure, heart rate and weight prior to your scheduled appointment. If you have an Apple Watch or Kardia, please upload any pertinent ECG strips the day before or morning of your appointment to [facility]. Our staff will also make sure you have reviewed the consent and agree to move forward with your scheduled tele-health visit.     THE DAY OF YOUR APPOINTMENT  Approximately 15 minutes prior to your scheduled appointment, you will receive a telephone call from one of [facility] team - your caller ID  may say "Unknown caller."  Our staff will confirm medications, vital signs for the day and any symptoms you may be experiencing. Please have this information available prior to the time of visit start. It may also be helpful for you to have a pad of paper and pen handy for any instructions given during your visit. They will also walk you through joining the smartphone meeting if this is a video visit.    CONSENT FOR TELE-HEALTH VISIT - PLEASE REVIEW  I hereby voluntarily request, consent and authorize [facility] and its employed or contracted physicians,  physician assistants, nurse practitioners or other licensed health care professionals (the Practitioner), to provide me with telemedicine health care services (the "Services") as deemed necessary by the treating Practitioner. I acknowledge and consent to receive the Services by the Practitioner via telemedicine. I understand that the telemedicine visit will involve communicating with the Practitioner through live audiovisual communication technology and the disclosure of certain medical information by electronic transmission. I acknowledge that I have been given the opportunity to request an in-person assessment or other available alternative prior to the telemedicine visit and am voluntarily participating in the telemedicine visit.  I understand that I have the right to withhold or withdraw my consent to the use of telemedicine in the course of my care at any time, without affecting my right to future care or treatment, and that the Practitioner or I may terminate the telemedicine visit at any time. I understand that I have the right to inspect all information obtained and/or recorded in the course of the telemedicine visit and may receive copies of available information for a reasonable fee.  I understand that some of the potential risks of receiving the Services via telemedicine include:  [name] Delay or interruption in medical evaluation due to technological equipment failure or disruption; . Information transmitted may not be sufficient (e.g. poor resolution of images) to allow for appropriate medical decision making by the Practitioner; and/or  . In rare instances, security protocols could fail, causing a breach of personal health information.  Furthermore, I acknowledge that it is my responsibility to provide information about my medical history, conditions and care that is complete and accurate to the best of my ability. I acknowledge that Practitioner's advice, recommendations, and/or decision may be  based on factors not within their control, such as incomplete or inaccurate data provided by me or distortions of diagnostic images or specimens that may result from electronic transmissions. I understand that the practice of medicine is not an exact science and that Practitioner makes no warranties or guarantees regarding treatment outcomes. I acknowledge that I will receive a copy of this consent concurrently upon execution via email to the email address I last provided but may also request a printed copy by calling the office of [facility].    I understand that my insurance will be billed for this visit.   I have read or had this consent read to me. . I understand the contents of this consent, which adequately explains the benefits and risks of the Services being provided via telemedicine.  . I have been provided ample opportunity to ask questions regarding this consent and the Services and have had my questions answered to my satisfaction. . I give my informed consent for the services to be provided through the use of telemedicine in my medical care  By participating in this telemedicine visit I agree to the above.

## 2019-04-09 NOTE — Progress Notes (Signed)
Virtual Visit via Video Note   This visit type was conducted due to national recommendations for restrictions regarding the COVID-19 Pandemic (e.g. social distancing) in an effort to limit this patient's exposure and mitigate transmission in our community.  Due to his co-morbid illnesses, this patient is at least at moderate risk for complications without adequate follow up.  This format is felt to be most appropriate for this patient at this time.  All issues noted in this document were discussed and addressed.  A limited physical exam was performed with this format.  Please refer to the patient's chart for his consent to telehealth for [facility]   Evaluation Performed:  Follow-up visit  This visit type was conducted due to national recommendations for restrictions regarding the COVID-19 Pandemic (e.g. social distancing).  This format is felt to be most appropriate for this patient at this time.  All issues noted in this document were discussed and addressed.  No physical exam was performed (except for noted visual exam findings with Video Visits).  Please refer to the patient's chart (MyChart message for video visits and phone note for telephone visits) for the patient's consent to telehealth for [facility].  Date:  04/10/2019   ID:  Lee, Carl [birth year], MRN [id number]  Patient Location:  Home  Provider location:   [place]  PCP:  [name], MD  Cardiologist:  [name], MD  Sleep Medicine:  Fransico Him, MD Electrophysiologist:  None   Chief Complaint:  OSA  History of Present Illness:    Carl Lee is a 63 y.o. male who presents via audio/video conferencing for a telehealth visit today.    Carl Lee is a 63 y.o. male with a hx of HTN, severe OSA with an AHI of 35 events per hour with oxygen desaturations as low ast 81% now on BiPAP.    He is doing well with his BiPAP device and thinks that he has gotten used to it.  He tolerates the full face  mask and feels the pressure is adequate.  Since going on BiPAP he feels rested in the am and has no significant daytime sleepiness.  He does have problems with thrush but uses a steroid inhaler.  He also has mouth dryness.  He does not think that he snores.    The patient does not have symptoms concerning for COVID-19 infection (fever, chills, cough, or new shortness of breath).   Prior CV studies:   The following studies were reviewed today:  PAP compliance download  Past Medical History:  Diagnosis Date  . AAA (abdominal aortic aneurysm) ([place]) 03/25/12   s/p endovascular repair  . Adenomatous polyp 06/2009  . Atrial tachycardia, paroxysmal ([place]) 06/18/2016  . Bladder cancer ([place]) 08/16/2008   recurrence 2015 and 2018 treated with office fulguration  . CAD (coronary artery disease), native coronary artery    S/p 3v CABG 01/2014   . Cataract 2019   bilateral eyes  . Childhood asthma    as a child  . Complication of anesthesia    "takes me a year to get over"  . Contact dermatitis    atypical [name])  . Controlled diabetes mellitus type 2 with complications ([place]) [id number]   CAD   . COPD (chronic obstructive pulmonary disease) ([place])   . Coronary artery disease    a.2015: CABG w/ LIMA-LAD, RIMA-OM, SVG-RCA b. Cath on 07/10/15 w/ patent LIMA-LAD and RIMA-OM. SVG-RCA occluded but collaterals present  . Diverticulosis 2013  severe by CT and colonoscopy  . Environmental allergies    improved as ages  . Fracture of lateral malleolus of left ankle 08/29/2013  . Headache    hasn't had migraines in years  . Hepatic steatosis 06/2015   by [place]  . Hx of migraines   . Hypertension   . Lactose intolerance 11/14/2018   Endorses for years - avoids dairy products  . Lumbosacral radiculopathy at S1 03/2012   left, with spinal stenosis (MRI 04/2013) improved with TF ESI L5/S1 and S1/2 ([place]ton [place])  . OSA (obstructive sleep apnea) 09/04/2014   Severe with AHI 35/hr, uses BIPAP  .  Overweight (BMI 25.0-29.9) 01/26/2017  . Pre-diabetes   . Prediabetes 06/08/2014   CAD   . Resistance to clopidogrel 2015   drug metabolism panel run - asked to scan  . Spinal stenosis    LS1 nerve root impingement from bulging disc  . Vitamin B12 deficiency   . Zenker diverticulum    Past Surgical History:  Procedure Laterality Date  . ABDOMINAL AORTIC ANEURYSM REPAIR  03/25/12   endovascular  . ABI  05/2013   WNL, L TBI low at 0.66  . Bladder cancer  March 2010 and Oct. 2015   Ernst Spell) x 2  . CARDIAC CATHETERIZATION N/A 07/10/2015   Procedure: Left Heart Cath and Cors/Grafts Angiography;  Surgeon: [name], MD;  Location: [place] [facility];  Service: Cardiovascular;  Laterality: N/A;  . CATARACT EXTRACTION W/ INTRAOCULAR LENS IMPLANT Bilateral 07/2018, 08/2018  . COLONOSCOPY  06/2012   hyperplastic polyp, diverticulosis ([place]) rec rpt 5 yrs  . COLONOSCOPY WITH PROPOFOL N/A 08/10/2017   TA, rpt 5 yrs Ardis Hughs, Melene Plan, MD)  . CORONARY ARTERY BYPASS GRAFT N/A 02/07/2014   Procedure: CORONARY ARTERY BYPASS GRAFTING (CABG);  Surgeon: [name], MD;  Location: [place];  Service: Open Heart Surgery;  Laterality: N/A;  CABG X 3, BILATERAL LIMA, EVH  . CYSTOSCOPY  08/16/08   Bladder Cancer  . EPIDURAL BLOCK INJECTION Left 09/2014, 10/2014, 12/2014   medial L2,3,4, dorsal L5 ramus blocks x2, L L5/S1 and S1/2 transforaminal ESI (Dalton Montz)  . ESI  04/2013, 06/2013   L L5S1, S12 transforaminal ESI (Dr.  [name])  . ESI Left 04/2014, 05/2014, 06/2014   L5/S1, S1/2; rpt; L4/5  . ESI  03/2016   R L5/S1 interlaminar ESI  . ESOPHAGEAL MANOMETRY N/A 08/10/2017   Procedure: ESOPHAGEAL MANOMETRY (EM);  Surgeon: [name], MD;  Location: [facility];  Service: Endoscopy;  Laterality: N/A;  . ESOPHAGOGASTRODUODENOSCOPY (EGD) WITH PROPOFOL N/A 08/10/2017   Procedure: ESOPHAGOGASTRODUODENOSCOPY (EGD) WITH PROPOFOL;  Surgeon: [name], MD;  Location: [facility]  ENDOSCOPY;  Service: Endoscopy;  Laterality: N/A;  . FULGURATION OF BLADDER TUMOR  01/2017   recurrent 93mm L lateral wall papillary transitional cell carcinoma ([place])  . INTRAOPERATIVE TRANSESOPHAGEAL ECHOCARDIOGRAM N/A 02/07/2014   Procedure: INTRAOPERATIVE TRANSESOPHAGEAL ECHOCARDIOGRAM;  Surgeon: [name], MD;  Location: [place];  Service: Open Heart Surgery;  Laterality: N/A;  . LEFT HEART CATH AND CORS/GRAFTS ANGIOGRAPHY N/A 02/11/2017   Procedure: LEFT HEART CATH AND CORS/GRAFTS ANGIOGRAPHY;  Surgeon: [name], MD;  Location: [place] CV LAB;  Service: Cardiovascular;  Laterality: N/A;  . LEFT HEART CATH AND CORS/GRAFTS ANGIOGRAPHY N/A 02/02/2019   Procedure: LEFT HEART CATH AND CORS/GRAFTS ANGIOGRAPHY;  Surgeon: Belva Crome, MD;  Location: [place] [facility];  Service: Cardiovascular;  Laterality: N/A;  . LEFT HEART CATHETERIZATION WITH  CORONARY ANGIOGRAM N/A 02/01/2014   Procedure: LEFT HEART CATHETERIZATION WITH CORONARY ANGIOGRAM;  Surgeon: [name], MD;  Location: [facility] CATH LAB;  Service: Cardiovascular;  Laterality: N/A;  . PRP epidural injection Left 11/2015   L5/S1, S1/2 transforaminal epidural PRP injections under fluoroscopy ([place])  . REPLACEMENT TOTAL HIP W/  RESURFACING IMPLANTS Left   . TONSILLECTOMY AND ADENOIDECTOMY  1973  . TOTAL HIP ARTHROPLASTY Left 03/08/2017  . TOTAL HIP ARTHROPLASTY Left 03/08/2017   Procedure: LEFT TOTAL HIP ARTHROPLASTY ANTERIOR APPROACH;  Surgeon: [name], MD;  Location: [place];  Service: Orthopedics;  Laterality: Left;  [name] VASECTOMY    . ZENKER'S DIVERTICULECTOMY  09/2017     No outpatient medications have been marked as taking for the 04/10/19 encounter (Telemedicine) with Sueanne Margarita, MD.   Current Facility-Administered Medications for the 04/10/19 encounter (Telemedicine) with Sueanne Margarita, MD  Medication  . betamethasone acetate-betamethasone sodium phosphate (CELESTONE) injection  12 mg  . bupivacaine (MARCAINE) 0.5 % (with pres) injection 3 mL  . methylPREDNISolone acetate (DEPO-MEDROL) injection 80 mg     Allergies:   Codeine and Doxycycline   Social History   Tobacco Use  . Smoking status: Former Smoker    Packs/day: 1.00    Years: 40.00    Pack years: 40.00    Types: Cigarettes    Quit date: 02/05/2014    Years since quitting: 5.1  . Smokeless tobacco: Never Used  Substance Use Topics  . Alcohol use: Yes    Alcohol/week: 0.0 standard drinks    Comment: 3-4 drinks per month  . Drug use: No     Family Hx: The patient's family history includes AAA (abdominal aortic aneurysm) in his maternal grandmother; Arrhythmia in his mother; Bladder Cancer in his mother; CAD (age of onset: 50) in his father; COPD in his mother; Dementia in his father; Diabetes in his father, mother, and sister; Heart attack in his father and maternal grandmother; Hyperlipidemia in his mother; Hypertension in his mother; Thyroid disease in his mother. There is no history of Colon cancer or Stomach cancer.  ROS:   Please see the history of present illness.     All other systems reviewed and are negative.   Labs/Other Tests and Data Reviewed:    Recent Labs: 07/13/2018: TSH 2.20 11/10/2018: ALT 9; Brain Natriuretic Peptide 87 01/31/2019: BUN 15; Creatinine, Ser 0.78; Hemoglobin 16.7; Platelets 255; Potassium 4.4; Sodium 142   Recent Lipid Panel Lab Results  Component Value Date/Time   CHOL 115 07/13/2018 08:51 AM   CHOL 170 01/05/2012   TRIG 115.0 07/13/2018 08:51 AM   TRIG 141 01/05/2012   HDL 46.50 07/13/2018 08:51 AM   CHOLHDL 2 07/13/2018 08:51 AM   LDLCALC 46 07/13/2018 08:51 AM   LDLDIRECT 62.0 12/28/2017 08:27 AM    Wt Readings from Last 3 Encounters:  04/10/19 207 lb (93.9 kg)  03/22/19 207 lb 5 oz (94 kg)  02/21/19 207 lb 13.9 oz (94.3 kg)     Objective:    Vital Signs:  BP (!) 142/93   Pulse 73   Ht 5' 9.5" (1.765 m)   Wt 207 lb (93.9 kg)   BMI 30.13  kg/m    CONSTITUTIONAL:  Well nourished, well developed male in no acute distress.  EYES: anicteric MOUTH: oral mucosa is pink RESPIRATORY: Normal respiratory effort, symmetric expansion CARDIOVASCULAR: No peripheral edema SKIN: No rash, lesions or ulcers MUSCULOSKELETAL: no digital cyanosis NEURO: Cranial Nerves II-XII grossly intact, moves all  extremities PSYCH: Intact judgement and insight.  A&O x 3, Mood/affect appropriate   ASSESSMENT & PLAN:    1.  OSA - The patient is tolerating PAP therapy well without any problems. The PAP download was reviewed today and showed an AHI of 0.6/hr on 16/12 cm H2O with 97% compliance in using more than 4 hours nightly.  The patient has been using and benefiting from PAP use and will continue to benefit from therapy. He is complaining his mask is leaking a lot and he just changed the cushion out.  He wants the pressure lowered if possible.  I will decrease BiPAP to 14/80mmHg and get a download in 2 weeks.   2.  HTN - BP borderline controlled on exam.  Continue amlodipine 5mg  daily and  Lopressor 12.5mg  BID.  I have asked him to check his BP twice daily for a week and call with the results.   3.  Obesity  -I have encouraged him to get into a routine exercise program and cut back on carbs and portions.    COVID-19 Education: The signs and symptoms of COVID-19 were discussed with the patient and how to seek care for testing (follow up with PCP or arrange E-visit).  The importance of social distancing was discussed today.  Patient Risk:   After full review of this patient's clinical status, I feel that they are at least moderate risk at this time.  Time:   Today, I have spent 20 minutes directly with the patient on telemedicine discussing medical problems including OSA, HTN .  We also reviewed the symptoms of COVID 19 and the ways to protect against contracting the virus with telehealth technology.  I spent an additional 5 minutes reviewing patient's  chart including PAPcompliance download.  Medication Adjustments/Labs and Tests Ordered: Current medicines are reviewed at length with the patient today.  Concerns regarding medicines are outlined above.  Tests Ordered: No orders of the defined types were placed in this encounter.  Medication Changes: No orders of the defined types were placed in this encounter.   Disposition:  Follow up in 1 year(s)  Signed, Fransico Him, MD  04/10/2019 8:17 AM    [facility]

## 2019-04-10 ENCOUNTER — Encounter: Payer: Self-pay | Admitting: Cardiology

## 2019-04-10 ENCOUNTER — Telehealth: Payer: Self-pay | Admitting: *Deleted

## 2019-04-10 ENCOUNTER — Encounter

## 2019-04-10 ENCOUNTER — Encounter: Payer: Self-pay | Admitting: *Deleted

## 2019-04-10 ENCOUNTER — Telehealth (INDEPENDENT_AMBULATORY_CARE_PROVIDER_SITE_OTHER): Payer: PPO | Admitting: Cardiology

## 2019-04-10 ENCOUNTER — Other Ambulatory Visit: Payer: Self-pay

## 2019-04-10 VITALS — BP 142/93 | HR 73 | Ht 69.5 in | Wt 207.0 lb

## 2019-04-10 DIAGNOSIS — G4733 Obstructive sleep apnea (adult) (pediatric): Secondary | ICD-10-CM

## 2019-04-10 DIAGNOSIS — I10 Essential (primary) hypertension: Secondary | ICD-10-CM | POA: Diagnosis not present

## 2019-04-10 NOTE — Patient Instructions (Signed)
Medication Instructions:  Your physician recommends that you continue on your current medications as directed. Please refer to the Current Medication list given to you today.  If you need a refill on your cardiac medications before your next appointment, please call your pharmacy.   Lab work: None If you have labs (blood work) drawn today and your tests are completely normal, you will receive your results only by: [name] MyChart Message (if you have MyChart) OR . A paper copy in the mail If you have any lab test that is abnormal or we need to change your treatment, we will call you to review the results.  Testing/Procedures: None  Follow-Up: At [facility], you and your health needs are our priority.  As part of our continuing mission to provide you with exceptional heart care, we have created designated Provider Care Teams.  These Care Teams include your primary Cardiologist (physician) and Advanced Practice Providers (APPs -  Physician Assistants and Nurse Practitioners) who all work together to provide you with the care you need, when you need it. You will need a follow up appointment in 12 months.  Please call our office 2 months in advance to schedule this appointment.  You may see Fransico Him, MD or one of the following Advanced Practice Providers on your designated Care Team:   [place], PA-C Melina Copa, PA-C . Ermalinda Barrios, PA-C  Any Other Special Instructions Will Be Listed Below (If Applicable).  Please monitor your blood pressure twice a day for the next week and call us with those readings or you can send them through [place].

## 2019-04-10 NOTE — Telephone Encounter (Signed)
-----   Message from Loren Racer, RN sent at 04/10/2019  8:30 AM EDT ----- Kermit Balo morning Gae Bon!!!  Just forwarding to you for the BiPAP information!  Anderson Malta ----- Message ----- From: Sueanne Margarita, MD Sent: 04/10/2019   8:17 AM EDT To: Loren Racer, RN  I have asked him to check his BP twice daily for a week and call with the results.  Decrease BiPAP to 14/10cm H2O and get a download in 2 weeks (send this to [place]).   Followup with me in 1 year

## 2019-04-10 NOTE — Telephone Encounter (Addendum)
Order placed to [place] via community message to Decrease BiPAP to 14/10cm H2O and get a download in 2 weeks

## 2019-04-11 ENCOUNTER — Encounter: Payer: Self-pay | Admitting: Primary Care

## 2019-04-11 ENCOUNTER — Ambulatory Visit: Payer: PPO | Admitting: Primary Care

## 2019-04-11 ENCOUNTER — Other Ambulatory Visit: Payer: Self-pay

## 2019-04-11 ENCOUNTER — Telehealth: Payer: Self-pay | Admitting: Primary Care

## 2019-04-11 VITALS — BP 108/60 | HR 60 | Temp 97.6°F | Ht 70.0 in | Wt 208.8 lb

## 2019-04-11 DIAGNOSIS — R05 Cough: Secondary | ICD-10-CM

## 2019-04-11 DIAGNOSIS — J449 Chronic obstructive pulmonary disease, unspecified: Secondary | ICD-10-CM | POA: Diagnosis not present

## 2019-04-11 DIAGNOSIS — R059 Cough, unspecified: Secondary | ICD-10-CM

## 2019-04-11 DIAGNOSIS — K225 Diverticulum of esophagus, acquired: Secondary | ICD-10-CM

## 2019-04-11 MED ORDER — FLUTTER DEVI: 0 refills | Status: DC

## 2019-04-11 MED ORDER — BEVESPI AEROSPHERE 9-4.8 MCG/ACT IN AERO: 2.0000 | INHALATION_SPRAY | Freq: Two times a day (BID) | RESPIRATORY_TRACT | 0 refills | Status: DC

## 2019-04-11 MED ORDER — NYSTATIN 100000 UNIT/ML MT SUSP: OROMUCOSAL | 0 refills | Status: DC

## 2019-04-11 MED ORDER — GUAIFENESIN ER 1200 MG PO TB12: 1.0000 | ORAL_TABLET | Freq: Two times a day (BID) | ORAL | 0 refills | Status: DC

## 2019-04-11 NOTE — Assessment & Plan Note (Addendum)
-   Recurrent and enlarging - Likely contributing to shortness of breath , increased risk for aspiration - Dr. Wilburn Cornelia with [facility] ENT referred to [facility]

## 2019-04-11 NOTE — Progress Notes (Signed)
@Patient  ID: Carl Lee, male    DOB: 05/14/1956, 63 y.o.   MRN: [id number]  Chief Complaint  Patient presents with   Follow-up    switched back to Anoro x2 weeks, breathing is unchanged    Referring provider: [name], MD   Synopsis: [name] had a CABG in 2015 and pulmonary critical care medicine was consulted for a COPD exacerbation. July 2015 lung function testing showed a ratio 62%, FEV1 of 2.10 L (56% predicted), total lung capacity 6.35 L (80% predicted). He smoked for many years and quit smoking in 2015.  He had a Zenker dierticulum that would cause a lot of trouble, this was repaired 09/2017.    HPI: 63 year old male, former smoker. PMH significant for moderate COPD, OSA, hypertension, CABG x 3, CAD, atrial tachycardia, diverticulosis, GERD, diabetes mellitus type 2, bladder cancer, triple AAA, recurrent zenker diverticulum. Patient of Dr. Lake Lee, last seen by pulmonary NP on 11/10/18. Trelegy changed back to Anoro in early September d/t thrush symptoms.   04/11/2019 Patient presents today for follow-up visit. Reported increased shortness of breath over the last 2-3 days. Coughing up some mucus. States that his breathing is terrible right now, however, it was great last week. States that he could do whatever he wanted and didn't need to use his rescue inhaler. He has been using his albuterol inhaler which does help. He noticed no improvement from Trelegy, stopped d/t thrush symptoms and resumed Anoro. He was given nystatin s/s but reports lingering sores on the roof of his mouth. Wears bipap at night. Havinig to belch in the morning. Discussed with his sleep provider. He had Zenker diverticulum repaired in march 2019 but states that it didn't not work and needs repair next month with [facility]. He saw Dr. Roxy Lee with thoracic surgery on June 24th for recurrent Zenkers. Discussed having surgical resection and recommended evaluation by Dr. Wilburn Lee with ENT. Dr. Wilburn Lee suggested  evaluation by Dr. Jule [facility] at [facility] who developed the transoral excision of Zenker's diverticulum.    PFT 01/2014 lung function testing showed a ratio 62%, FEV1 of 2.10 L (56% predicted), total lung capacity 6.35 L (80% predicted).  10/2015 pulmonary function testing: FEV1 1.8 L, total lung capacity 99% predicted, DLCO 39% predicted 11/29/2017 Spirometry - FVC 3.0 (66%), FEV1 1.7 (49%), ratio 56 (75%)  Echo 11/12/2015 TTE > normal LVEF, RV mildly dilated and systolic function mildly reduced  Labs: 01/31/19 Eos 100  Allergies  Allergen Reactions   Codeine Other (See Comments)    Nightmares   Doxycycline Hives and Other (See Comments)    mild    Immunization History  Administered Date(s) Administered   Influenza,inj,Quad PF,6+ Mos 04/17/2013, 06/17/2014, 05/01/2015, 03/25/2016, 04/01/2017, 07/14/2018, 03/22/2019   Pneumococcal Conjugate-13 05/01/2015   Pneumococcal Polysaccharide-23 06/17/2014   Tdap 12/14/2012   Zoster 12/23/2015    Past Medical History:  Diagnosis Date   AAA (abdominal aortic aneurysm) ([place]) 03/25/12   s/p endovascular repair   Adenomatous polyp 06/2009   Atrial tachycardia, paroxysmal ([place]) 06/18/2016   Bladder cancer ([place]) 08/16/2008   recurrence 2015 and 2018 treated with office fulguration   CAD (coronary artery disease), native coronary artery    S/p 3v CABG 01/2014    Cataract 2019   bilateral eyes   Childhood asthma    as a child   Complication of anesthesia    "takes me a year to get over"   Contact dermatitis    atypical [name])  Controlled diabetes mellitus type 2 with complications ([place]) [id number]   CAD    COPD (chronic obstructive pulmonary disease) (HCC)    Coronary artery disease    a.2015: CABG w/ LIMA-LAD, RIMA-OM, SVG-RCA b. Cath on 07/10/15 w/ patent LIMA-LAD and RIMA-OM. SVG-RCA occluded but collaterals present   Diverticulosis 2013   severe by CT and colonoscopy   Environmental  allergies    improved as ages   Fracture of lateral malleolus of left ankle 08/29/2013   Headache    hasn't had migraines in years   Hepatic steatosis 06/2015   by [place]   Hx of migraines    Hypertension    Lactose intolerance 11/14/2018   Endorses for years - avoids dairy products   Lumbosacral radiculopathy at S1 03/2012   left, with spinal stenosis (MRI 04/2013) improved with TF ESI L5/S1 and S1/2 ([place])   OSA (obstructive sleep apnea) 09/04/2014   Severe with AHI 35/hr, uses BIPAP   Overweight (BMI 25.0-29.9) 01/26/2017   Pre-diabetes    Prediabetes 06/08/2014   CAD    Resistance to clopidogrel 2015   drug metabolism panel run - asked to scan   Spinal stenosis    LS1 nerve root impingement from bulging disc   Vitamin B12 deficiency    Zenker diverticulum     Tobacco History: Social History   Tobacco Use  Smoking Status Former Smoker   Packs/day: 1.00   Years: 40.00   Pack years: 40.00   Types: Cigarettes   Quit date: 02/05/2014   Years since quitting: 5.1  Smokeless Tobacco Never Used   Counseling given: Not Answered   Outpatient Medications Prior to Visit  Medication Sig Dispense Refill   acetaminophen (TYLENOL) 325 MG tablet Take 2 tablets (650 mg total) by mouth every 6 (six) hours as needed for moderate pain or headache.     albuterol (PROAIR HFA) 108 (90 Base) MCG/ACT inhaler Inhale 2 puffs into the lungs every 4 (four) hours as needed for wheezing or shortness of breath. 1 g 3   amLODipine (NORVASC) 5 MG tablet TAKE 1 TABLET BY MOUTH EVERY DAY 90 tablet 2   aspirin EC 81 MG tablet Take 1 tablet (81 mg total) by mouth daily. 90 tablet 3   atorvastatin (LIPITOR) 20 MG tablet TAKE 1 TABLET (20 MG TOTAL) BY MOUTH DAILY AT 6 PM. 90 tablet 3   calcium carbonate (TUMS - DOSED IN MG ELEMENTAL CALCIUM) 500 MG chewable tablet Chew 2 tablets by mouth daily as needed for indigestion or heartburn.     Carboxymethylcellul-Glycerin (LUBRICATING  EYE DROPS OP) Place 1 drop into both eyes daily as needed (dry eyes).     cholecalciferol (VITAMIN D3) 25 MCG (1000 UT) tablet Take 1,000 Units by mouth daily.     famotidine (PEPCID) 20 MG tablet TAKE 1 TABLET BY MOUTH TWICE A DAY 180 tablet 1   glucose blood (ONE TOUCH ULTRA TEST) test strip CHECK ONCE DAILY AND AS NEEDED     HYDROcodone-acetaminophen (NORCO/VICODIN) 5-325 MG tablet Take 0.5 tablets by mouth every 6 (six) hours as needed for moderate pain.      ketotifen (ZADITOR) 0.025 % ophthalmic solution Place 1 drop into both eyes daily as needed (allergies).     metoprolol tartrate (LOPRESSOR) 25 MG tablet TAKE 0.5 TABLETS (12.5 MG TOTAL) BY MOUTH 2 (TWO) TIMES DAILY. 90 tablet 3   omeprazole (PRILOSEC) 40 MG capsule Take 1 capsule (40 mg total) by mouth daily. Take for 3 weeks daily  then as needed 30 capsule 3   oxyCODONE (ROXICODONE) 5 MG immediate release tablet Take 1 tablet (5 mg total) by mouth every 6 (six) hours as needed for severe pain. 40 tablet 0   Simethicone (GAS-X PO) Take 1 tablet by mouth daily as needed (gas).     tamsulosin (FLOMAX) 0.4 MG CAPS capsule Take 1 capsule (0.4 mg total) by mouth daily after supper.     vitamin B-12 (CYANOCOBALAMIN) 500 MCG tablet Take 1 tablet (500 mcg total) by mouth every other day.     umeclidinium-vilanterol (ANORO ELLIPTA) 62.5-25 MCG/INH AEPB TAKE 1 PUFF BY MOUTH EVERY DAY 180 each 3   ipratropium-albuterol (DUONEB) 0.5-2.5 (3) MG/3ML SOLN TAKE 3 MLS BY NEBULIZATION EVERY 6 (SIX) HOURS AS NEEDED FOR UP TO 30 DAYS. 360 mL 0   nystatin (MYCOSTATIN) 100000 UNIT/ML suspension Use as directed 5 mLs in the mouth or throat 4 (four) times daily.     nystatin (MYCOSTATIN) 100000 UNIT/ML suspension Take 5 mLs (500,000 Units total) by mouth 3 (three) times daily. (Patient not taking: Reported on 04/11/2019) 60 mL 0   betamethasone acetate-betamethasone sodium phosphate (CELESTONE) injection 12 mg      bupivacaine (MARCAINE) 0.5 %  (with pres) injection 3 mL      methylPREDNISolone acetate (DEPO-MEDROL) injection 80 mg      No facility-administered medications prior to visit.    Review of Systems  Review of Systems  Constitutional: Negative.   Respiratory: Positive for cough, shortness of breath and wheezing.    Physical Exam  BP 108/60 (BP Location: Left Arm, Cuff Size: Normal)    Pulse 60    Temp 97.6 F (36.4 C) (Temporal)    Ht 5\' 10"  (1.778 m)    Wt 208 lb 12.8 oz (94.7 kg)    SpO2 93%    BMI 29.96 kg/m  Physical Exam Constitutional:      Appearance: Normal appearance.  Neck:     Musculoskeletal: Normal range of motion and neck supple.  Pulmonary:     Effort: Pulmonary effort is normal.     Breath sounds: Wheezing and rhonchi present.     Comments: Scattered wheezing and rhonchi  Neurological:     Mental Status: He is alert.      Lab Results:  CBC    Component Value Date/Time   WBC 9.7 01/31/2019 0940   WBC 8.9 11/10/2018 1602   RBC 5.11 01/31/2019 0940   RBC 5.01 11/10/2018 1602   HGB 16.7 01/31/2019 0940   HCT 47.5 01/31/2019 0940   PLT 255 01/31/2019 0940   MCV 93 01/31/2019 0940   MCV 96 01/09/2014 1237   MCH 32.7 01/31/2019 0940   MCH 32.7 11/10/2018 1602   MCHC 35.2 01/31/2019 0940   MCHC 35.1 11/10/2018 1602   RDW 12.6 01/31/2019 0940   RDW 13.5 01/09/2014 1237   LYMPHSABS 2.8 01/31/2019 0940   MONOABS 0.7 06/29/2017 0955   EOSABS 0.1 01/31/2019 0940   BASOSABS 0.1 01/31/2019 0940    BMET    Component Value Date/Time   NA 142 01/31/2019 0940   NA 138 01/09/2014 1237   K 4.4 01/31/2019 0940   K 3.7 01/09/2014 1237   CL 102 01/31/2019 0940   CL 104 01/09/2014 1237   CO2 23 01/31/2019 0940   CO2 27 01/09/2014 1237   GLUCOSE 107 (H) 01/31/2019 0940   GLUCOSE 113 (H) 11/10/2018 1602   GLUCOSE 83 01/09/2014 1237   BUN 15 01/31/2019 0940  BUN 19 (H) 01/09/2014 1237   CREATININE 0.78 01/31/2019 0940   CREATININE 0.82 11/10/2018 1602   CALCIUM 9.6 01/31/2019 0940    CALCIUM 8.6 01/09/2014 1237   GFRNONAA 95 01/31/2019 0940   GFRNONAA >60 01/09/2014 1237   GFRAA 110 01/31/2019 0940   GFRAA >60 01/09/2014 1237    BNP    Component Value Date/Time   BNP 87 11/10/2018 1602    ProBNP No results found for: PROBNP  Imaging: Mr Lumbar Spine W/o Contrast  Result Date: 03/31/2019 CLINICAL DATA:  Low back pain EXAM: MRI LUMBAR SPINE WITHOUT CONTRAST TECHNIQUE: Multiplanar, multisequence MR imaging of the lumbar spine was performed. No intravenous contrast was administered. Metal artifact reduction techniques were utilized. COMPARISON:  11/27/2015 FINDINGS: Severe metallic susceptibility artifact resulting from aortoiliac stent graft which partially obscures portions of the lumbar spine. Segmentation:  Standard. Alignment:  Slight lumbar dextrocurvature.  No static listhesis. Vertebrae: The L2 through L5 vertebral bodies are largely obscured by susceptibility artifact. Within those limitations. No fracture, bone lesion, or evidence of discitis identified. Conus medullaris and cauda equina: Conus extends to the L2 level. Conus and cauda equina appear normal. Paraspinal and other soft tissues: Aortoiliac stent graft. Disc levels: T12-L1: No significant disc protrusion, foraminal stenosis, or canal stenosis. L1-L2: Minimal disc bulge.  No foraminal or canal stenosis. L2-L3: Evaluation of the canal on axial images obscured by susceptibility artifact. On the sagittal sequences. There is a focal right paracentral disc protrusion, enlarged from prior. At least mild-to-moderate canal stenosis. No significant foraminal stenosis evident. L3-L4: Diffuse disc bulge and mild bilateral facet arthrosis results in mild canal stenosis without foraminal stenosis. Findings appears similar to prior. L4-L5: Diffuse disc bulge and moderate bilateral facet arthrosis resulting in mild-to-moderate canal stenosis and mild right foraminal stenosis. Findings slightly progressed from prior. L5-S1:  Diffuse disc bulge with left paracentral protrusion and mild bilateral facet arthrosis. There is left subarticular recess stenosis. No significant foraminal or canal stenosis. Findings similar to prior. IMPRESSION: 1. Examination is degraded by extensive metallic susceptibility artifact related to aortoiliac stent graft. 2. Worsened right paracentral disc protrusion at L2-3 which appears to cause at least mild-to-moderate canal stenosis, suboptimally evaluated. 3. Slight progression of degenerative disc disease at L4-5 where there is mild-to-moderate canal stenosis and mild right foraminal stenosis. Electronically Signed   By: Davina Poke M.D.   On: 03/31/2019 17:17   Mr Hip Left W/o Contrast  Result Date: 03/31/2019 CLINICAL DATA:  Left hip pain. History of left total hip arthroplasty in 2018. EXAM: MR OF THE LEFT HIP WITHOUT CONTRAST TECHNIQUE: Multiplanar, multisequence MR imaging was performed. No intravenous contrast was administered. Metal artifact reduction techniques were utilized. COMPARISON:  X-ray 03/06/2018.  MRI 04/26/2018 FINDINGS: Bones: There is susceptibility artifact related to left total hip arthroplasty. There is also extensive susceptibility artifact within the pelvis related to aortoiliac stent graft material. No acute fracture identified. No focal bone marrow signal abnormality is evident. No bone lesion. Mild degenerative changes of the right hip. Joint or bursal effusion Joint effusion: No periprosthetic fluid is evident. No right hip joint effusion. Bursae: No bursal fluid collections about the hip or pelvis. Muscles and tendons Muscles and tendons: Unremarkable muscle bulk and signal. Tendons are intact. Other findings Miscellaneous:   Extensive colonic diverticulosis. IMPRESSION: 1. No evidence of left total hip arthroplasty hardware complication. No periprosthetic fluid collection. 2. Extensive colonic diverticulosis. Electronically Signed   By: Davina Poke M.D.   On:  03/31/2019 17:27  Assessment & Plan:   COPD, moderate (HCC) - Intermittent dyspnea symptoms; no improvement with steroids or abx - Trelegy switched back to Anoro d/t thrush. Recommend trial Bevespi twice daily - LS with rhonchi and wheezing - Recommend pulmonary toileting with cough/deep breathing exercises, flutter valve, mucinex and albuterol nebulizer  - Checking sputum culture x 3 - FU 6 weeks with new [place] pulmonary provider (former [facility] patient)  Zenker diverticulum - Recurrent and enlarging - Likely contributing to shortness of breath , increased risk for aspiration - Dr. Wilburn Lee with [facility] referred to Francee Gentile, NP 04/11/2019

## 2019-04-11 NOTE — Telephone Encounter (Addendum)
Called and spoke with pt. Pt states he already has a nebulizer compressor and ipratropium-albuterol solution; however, he was inquiring how he could receive replacement tubing for his compressor. I let him know we could send an order to a DME company for supplies. Pt expressed understanding.   Pt was also inquiring when he would be established with a new provider, as BQ is not currently in the office. I attempted to get pt set up for an appt with RB for mid-October; however, pt states he will be having surgery around that time and may have to push the appt out to November. As of today, Dr. Lamonte Sakai does not have a schedule out for November, so pt agreed to call our office back after his surgery to set up an appt.   I also inquired pt about his Duoneb solution. I let him know, according to his chart, the Duoneb is expired, and I asked if he could check the expiration date on his medication. Pt stated he was currently in line at [facility] and does not have access to his medication. He states once he gets home he will call our office back and ask for me with definite answers.   Will keep encounter open to f/u on once pt calls back.

## 2019-04-11 NOTE — Telephone Encounter (Signed)
If patient doesn't have nebulizer can we please sent order to DME company for machine and Albuterol nebulizer q 6 hours prn sob/wheezing

## 2019-04-11 NOTE — Patient Instructions (Addendum)
  Orders: Sputum culture x3  Recommendations: Stop Anoro for now Trial Bevespi two puffs twice daily Take mucinex twice daily Flutter valve 3-4 times a day for chest congestion  Rx: Refill nystatin- have on hand for thrush symptoms   Follow-up: 6 weeks with new [place] pulmonary provider

## 2019-04-11 NOTE — Assessment & Plan Note (Addendum)
-   11/29/2017 Spirometry - FVC 3.0 (66%), FEV1 1.7 (49%), ratio 56 (75%) - Intermittent dyspnea symptoms; no improvement with steroids or abx - Trelegy switched back to Anoro d/t thrush. Recommend trial Bevespi twice daily - LS with rhonchi and wheezing - Recommend pulmonary toileting with cough/deep breathing exercises, flutter valve, mucinex and albuterol nebulizer  - Checking sputum culture x 3 - FU 6 weeks with new [place] pulmonary provider (former [facility] patient)

## 2019-04-12 NOTE — Progress Notes (Signed)
Reviewed, agree 

## 2019-04-13 ENCOUNTER — Ambulatory Visit: Payer: PPO | Admitting: Cardiology

## 2019-04-17 ENCOUNTER — Emergency Department (HOSPITAL_COMMUNITY): Payer: PPO

## 2019-04-17 ENCOUNTER — Ambulatory Visit (HOSPITAL_COMMUNITY): Payer: PPO

## 2019-04-17 ENCOUNTER — Other Ambulatory Visit: Payer: Self-pay

## 2019-04-17 ENCOUNTER — Telehealth: Payer: Self-pay

## 2019-04-17 ENCOUNTER — Inpatient Hospital Stay (HOSPITAL_COMMUNITY)
Admission: EM | Admit: 2019-04-17 | Discharge: 2019-04-21 | DRG: 418 | Disposition: A | Discharge status: Home / Self Care | Payer: PPO | Attending: Internal Medicine | Admitting: Internal Medicine

## 2019-04-17 DIAGNOSIS — R7303 Prediabetes: Secondary | ICD-10-CM | POA: Diagnosis present

## 2019-04-17 DIAGNOSIS — R079 Chest pain, unspecified: Secondary | ICD-10-CM | POA: Diagnosis not present

## 2019-04-17 DIAGNOSIS — R109 Unspecified abdominal pain: Secondary | ICD-10-CM | POA: Diagnosis not present

## 2019-04-17 DIAGNOSIS — Y832 Surgical operation with anastomosis, bypass or graft as the cause of abnormal reaction of the patient, or of later complication, without mention of misadventure at the time of the procedure: Secondary | ICD-10-CM | POA: Diagnosis present

## 2019-04-17 DIAGNOSIS — K315 Obstruction of duodenum: Secondary | ICD-10-CM | POA: Diagnosis not present

## 2019-04-17 DIAGNOSIS — E739 Lactose intolerance, unspecified: Secondary | ICD-10-CM | POA: Diagnosis present

## 2019-04-17 DIAGNOSIS — K8042 Calculus of bile duct with acute cholecystitis without obstruction: Principal | ICD-10-CM | POA: Diagnosis present

## 2019-04-17 DIAGNOSIS — J449 Chronic obstructive pulmonary disease, unspecified: Secondary | ICD-10-CM | POA: Diagnosis present

## 2019-04-17 DIAGNOSIS — R7401 Elevation of levels of liver transaminase levels: Secondary | ICD-10-CM | POA: Diagnosis not present

## 2019-04-17 DIAGNOSIS — Z96642 Presence of left artificial hip joint: Secondary | ICD-10-CM | POA: Diagnosis present

## 2019-04-17 DIAGNOSIS — K8040 Calculus of bile duct with cholecystitis, unspecified, without obstruction: Secondary | ICD-10-CM | POA: Diagnosis not present

## 2019-04-17 DIAGNOSIS — R0602 Shortness of breath: Secondary | ICD-10-CM | POA: Diagnosis not present

## 2019-04-17 DIAGNOSIS — I2510 Atherosclerotic heart disease of native coronary artery without angina pectoris: Secondary | ICD-10-CM | POA: Diagnosis not present

## 2019-04-17 DIAGNOSIS — E1151 Type 2 diabetes mellitus with diabetic peripheral angiopathy without gangrene: Secondary | ICD-10-CM | POA: Diagnosis present

## 2019-04-17 DIAGNOSIS — Z833 Family history of diabetes mellitus: Secondary | ICD-10-CM

## 2019-04-17 DIAGNOSIS — G8929 Other chronic pain: Secondary | ICD-10-CM | POA: Diagnosis present

## 2019-04-17 DIAGNOSIS — G4733 Obstructive sleep apnea (adult) (pediatric): Secondary | ICD-10-CM | POA: Diagnosis present

## 2019-04-17 DIAGNOSIS — Z79899 Other long term (current) drug therapy: Secondary | ICD-10-CM

## 2019-04-17 DIAGNOSIS — T85868A Thrombosis due to other internal prosthetic devices, implants and grafts, initial encounter: Secondary | ICD-10-CM | POA: Diagnosis present

## 2019-04-17 DIAGNOSIS — R112 Nausea with vomiting, unspecified: Secondary | ICD-10-CM | POA: Diagnosis not present

## 2019-04-17 DIAGNOSIS — K760 Fatty (change of) liver, not elsewhere classified: Secondary | ICD-10-CM | POA: Diagnosis not present

## 2019-04-17 DIAGNOSIS — K225 Diverticulum of esophagus, acquired: Secondary | ICD-10-CM | POA: Diagnosis present

## 2019-04-17 DIAGNOSIS — Z951 Presence of aortocoronary bypass graft: Secondary | ICD-10-CM

## 2019-04-17 DIAGNOSIS — Z9889 Other specified postprocedural states: Secondary | ICD-10-CM | POA: Diagnosis not present

## 2019-04-17 DIAGNOSIS — Z8052 Family history of malignant neoplasm of bladder: Secondary | ICD-10-CM

## 2019-04-17 DIAGNOSIS — K5750 Diverticulosis of both small and large intestine without perforation or abscess without bleeding: Secondary | ICD-10-CM | POA: Diagnosis present

## 2019-04-17 DIAGNOSIS — E538 Deficiency of other specified B group vitamins: Secondary | ICD-10-CM | POA: Diagnosis not present

## 2019-04-17 DIAGNOSIS — E785 Hyperlipidemia, unspecified: Secondary | ICD-10-CM | POA: Diagnosis present

## 2019-04-17 DIAGNOSIS — R7881 Bacteremia: Secondary | ICD-10-CM | POA: Diagnosis not present

## 2019-04-17 DIAGNOSIS — Z8349 Family history of other endocrine, nutritional and metabolic diseases: Secondary | ICD-10-CM

## 2019-04-17 DIAGNOSIS — Z825 Family history of asthma and other chronic lower respiratory diseases: Secondary | ICD-10-CM

## 2019-04-17 DIAGNOSIS — H353190 Nonexudative age-related macular degeneration, unspecified eye, stage unspecified: Secondary | ICD-10-CM | POA: Diagnosis present

## 2019-04-17 DIAGNOSIS — Z20828 Contact with and (suspected) exposure to other viral communicable diseases: Secondary | ICD-10-CM | POA: Diagnosis not present

## 2019-04-17 DIAGNOSIS — K8050 Calculus of bile duct without cholangitis or cholecystitis without obstruction: Secondary | ICD-10-CM | POA: Diagnosis not present

## 2019-04-17 DIAGNOSIS — Z7982 Long term (current) use of aspirin: Secondary | ICD-10-CM

## 2019-04-17 DIAGNOSIS — K219 Gastro-esophageal reflux disease without esophagitis: Secondary | ICD-10-CM | POA: Diagnosis not present

## 2019-04-17 DIAGNOSIS — K8063 Calculus of gallbladder and bile duct with acute cholecystitis with obstruction: Secondary | ICD-10-CM

## 2019-04-17 DIAGNOSIS — R1011 Right upper quadrant pain: Secondary | ICD-10-CM | POA: Diagnosis not present

## 2019-04-17 DIAGNOSIS — Z885 Allergy status to narcotic agent status: Secondary | ICD-10-CM

## 2019-04-17 DIAGNOSIS — K810 Acute cholecystitis: Secondary | ICD-10-CM

## 2019-04-17 DIAGNOSIS — K8309 Other cholangitis: Secondary | ICD-10-CM | POA: Diagnosis not present

## 2019-04-17 DIAGNOSIS — I700 Atherosclerosis of aorta: Secondary | ICD-10-CM | POA: Diagnosis present

## 2019-04-17 DIAGNOSIS — Z87891 Personal history of nicotine dependence: Secondary | ICD-10-CM

## 2019-04-17 DIAGNOSIS — B9689 Other specified bacterial agents as the cause of diseases classified elsewhere: Secondary | ICD-10-CM | POA: Diagnosis not present

## 2019-04-17 DIAGNOSIS — Z8249 Family history of ischemic heart disease and other diseases of the circulatory system: Secondary | ICD-10-CM

## 2019-04-17 DIAGNOSIS — Z8551 Personal history of malignant neoplasm of bladder: Secondary | ICD-10-CM

## 2019-04-17 DIAGNOSIS — K8010 Calculus of gallbladder with chronic cholecystitis without obstruction: Secondary | ICD-10-CM | POA: Diagnosis not present

## 2019-04-17 DIAGNOSIS — R05 Cough: Secondary | ICD-10-CM | POA: Diagnosis not present

## 2019-04-17 DIAGNOSIS — H918X3 Other specified hearing loss, bilateral: Secondary | ICD-10-CM | POA: Diagnosis present

## 2019-04-17 DIAGNOSIS — Z881 Allergy status to other antibiotic agents status: Secondary | ICD-10-CM

## 2019-04-17 DIAGNOSIS — E1169 Type 2 diabetes mellitus with other specified complication: Secondary | ICD-10-CM | POA: Diagnosis present

## 2019-04-17 DIAGNOSIS — I10 Essential (primary) hypertension: Secondary | ICD-10-CM | POA: Diagnosis present

## 2019-04-17 DIAGNOSIS — N400 Enlarged prostate without lower urinary tract symptoms: Secondary | ICD-10-CM | POA: Diagnosis not present

## 2019-04-17 DIAGNOSIS — Z9049 Acquired absence of other specified parts of digestive tract: Secondary | ICD-10-CM | POA: Diagnosis not present

## 2019-04-17 DIAGNOSIS — Z800 Family history of malignant neoplasm of digestive organs: Secondary | ICD-10-CM

## 2019-04-17 DIAGNOSIS — K828 Other specified diseases of gallbladder: Secondary | ICD-10-CM | POA: Diagnosis not present

## 2019-04-17 LAB — TROPONIN I (HIGH SENSITIVITY)
Troponin I (High Sensitivity): 7 ng/L (ref <18)
Troponin I (High Sensitivity): 8 ng/L (ref <18)

## 2019-04-17 LAB — CBC
HCT: 49.8 % (ref 39.0–52.0)
Hemoglobin: 17.3 g/dL — ABNORMAL HIGH (ref 13.0–17.0)
MCH: 32.6 pg (ref 26.0–34.0)
MCHC: 34.7 g/dL (ref 30.0–36.0)
MCV: 94 fL (ref 80.0–100.0)
Platelets: 284 10*3/uL (ref 150–400)
RBC: 5.3 MIL/uL (ref 4.22–5.81)
RDW: 13.3 % (ref 11.5–15.5)
WBC: 13.2 10*3/uL — ABNORMAL HIGH (ref 4.0–10.5)
nRBC: 0 % (ref 0.0–0.2)

## 2019-04-17 LAB — LIPID PANEL
Cholesterol: 85 mg/dL (ref 0–200)
HDL: 23 mg/dL — ABNORMAL LOW (ref >40)
LDL Cholesterol: 49 mg/dL (ref 0–99)
Total CHOL/HDL Ratio: 3.7 RATIO
Triglycerides: 67 mg/dL (ref <150)
VLDL: 13 mg/dL (ref 0–40)

## 2019-04-17 LAB — COMPREHENSIVE METABOLIC PANEL
ALT: 223 U/L — ABNORMAL HIGH (ref 0–44)
AST: 519 U/L — ABNORMAL HIGH (ref 15–41)
Albumin: 3.9 g/dL (ref 3.5–5.0)
Alkaline Phosphatase: 280 U/L — ABNORMAL HIGH (ref 38–126)
Anion gap: 11 (ref 5–15)
BUN: 15 mg/dL (ref 8–23)
CO2: 26 mmol/L (ref 22–32)
Calcium: 9.2 mg/dL (ref 8.9–10.3)
Chloride: 100 mmol/L (ref 98–111)
Creatinine, Ser: 0.89 mg/dL (ref 0.61–1.24)
GFR calc Af Amer: >60 mL/min (ref >60)
GFR calc non Af Amer: >60 mL/min (ref >60)
Glucose, Bld: 138 mg/dL — ABNORMAL HIGH (ref 70–99)
Potassium: 3.9 mmol/L (ref 3.5–5.1)
Sodium: 137 mmol/L (ref 135–145)
Total Bilirubin: 5.5 mg/dL — ABNORMAL HIGH (ref 0.3–1.2)
Total Protein: 7.4 g/dL (ref 6.5–8.1)

## 2019-04-17 LAB — URINALYSIS, ROUTINE W REFLEX MICROSCOPIC
Glucose, UA: NEGATIVE mg/dL
Hgb urine dipstick: NEGATIVE
Ketones, ur: 5 mg/dL — AB
Leukocytes,Ua: NEGATIVE
Nitrite: NEGATIVE
Protein, ur: NEGATIVE mg/dL
Specific Gravity, Urine: 1.02 (ref 1.005–1.030)
pH: 5 (ref 5.0–8.0)

## 2019-04-17 LAB — LIPASE, BLOOD: Lipase: 17 U/L (ref 11–51)

## 2019-04-17 LAB — SARS CORONAVIRUS 2 (TAT 6-24 HRS): SARS Coronavirus 2: NEGATIVE

## 2019-04-17 LAB — LACTIC ACID, PLASMA
Lactic Acid, Venous: 1.4 mmol/L (ref 0.5–1.9)
Lactic Acid, Venous: 1.2 mmol/L (ref 0.5–1.9)

## 2019-04-17 MED ORDER — PIPERACILLIN-TAZOBACTAM 3.375 G IVPB 30 MIN
3.3750 g | Freq: Once | INTRAVENOUS | Status: AC
  Administered 2019-04-17: 13:00:00 3.375 g via INTRAVENOUS
  Filled 2019-04-17: qty 50

## 2019-04-17 MED ORDER — TAMSULOSIN HCL 0.4 MG PO CAPS
0.4000 mg | ORAL_CAPSULE | Freq: Every day | ORAL | Status: DC
  Administered 2019-04-17 – 2019-04-20 (×4): 0.4 mg via ORAL
  Filled 2019-04-17 (×4): qty 1

## 2019-04-17 MED ORDER — SODIUM CHLORIDE 0.9 % IV SOLN
INTRAVENOUS | Status: DC
  Administered 2019-04-17: 20:00:00 via INTRAVENOUS

## 2019-04-17 MED ORDER — PANTOPRAZOLE SODIUM 40 MG PO TBEC
40.0000 mg | DELAYED_RELEASE_TABLET | Freq: Every day | ORAL | Status: DC
  Administered 2019-04-19 – 2019-04-21 (×3): 40 mg via ORAL
  Filled 2019-04-17 (×2): qty 1
  Filled 2019-04-17: qty 2

## 2019-04-17 MED ORDER — IPRATROPIUM-ALBUTEROL 0.5-2.5 (3) MG/3ML IN SOLN
3.0000 mL | Freq: Four times a day (QID) | RESPIRATORY_TRACT | Status: DC | PRN
  Administered 2019-04-18: 3 mL via RESPIRATORY_TRACT
  Filled 2019-04-17: qty 3

## 2019-04-17 MED ORDER — HYDROMORPHONE HCL 1 MG/ML IJ SOLN
1.0000 mg | Freq: Once | INTRAMUSCULAR | Status: AC
  Administered 2019-04-17: 13:00:00 1 mg via INTRAVENOUS
  Filled 2019-04-17: qty 1

## 2019-04-17 MED ORDER — SODIUM CHLORIDE 0.9 % IV BOLUS
1000.0000 mL | Freq: Once | INTRAVENOUS | Status: AC
  Administered 2019-04-17: 1000 mL via INTRAVENOUS

## 2019-04-17 MED ORDER — HYDROMORPHONE HCL 1 MG/ML IJ SOLN
1.0000 mg | Freq: Once | INTRAMUSCULAR | Status: AC
  Administered 2019-04-17: 16:00:00 1 mg via INTRAVENOUS
  Filled 2019-04-17: qty 1

## 2019-04-17 MED ORDER — ONDANSETRON HCL 4 MG PO TABS: 4.0000 mg | ORAL_TABLET | Freq: Four times a day (QID) | ORAL | Status: DC | PRN

## 2019-04-17 MED ORDER — AMLODIPINE BESYLATE 5 MG PO TABS
5.0000 mg | ORAL_TABLET | Freq: Every day | ORAL | Status: DC
  Administered 2019-04-17 – 2019-04-21 (×5): 5 mg via ORAL
  Filled 2019-04-17 (×5): qty 1

## 2019-04-17 MED ORDER — IOHEXOL 300 MG/ML  SOLN
100.0000 mL | Freq: Once | INTRAMUSCULAR | Status: AC | PRN
  Administered 2019-04-17: 100 mL via INTRAVENOUS

## 2019-04-17 MED ORDER — HYDROMORPHONE HCL 1 MG/ML IJ SOLN
1.0000 mg | INTRAMUSCULAR | Status: DC | PRN
  Administered 2019-04-17 – 2019-04-18 (×2): 1 mg via INTRAVENOUS
  Filled 2019-04-17 (×2): qty 1

## 2019-04-17 MED ORDER — PIPERACILLIN-TAZOBACTAM 3.375 G IVPB
3.3750 g | Freq: Three times a day (TID) | INTRAVENOUS | Status: DC
  Administered 2019-04-17 – 2019-04-20 (×8): 3.375 g via INTRAVENOUS
  Filled 2019-04-17 (×10): qty 50

## 2019-04-17 MED ORDER — ONDANSETRON HCL 4 MG/2ML IJ SOLN
4.0000 mg | Freq: Once | INTRAMUSCULAR | Status: AC
  Administered 2019-04-17: 4 mg via INTRAVENOUS
  Filled 2019-04-17: qty 2

## 2019-04-17 MED ORDER — ONDANSETRON HCL 4 MG/2ML IJ SOLN
4.0000 mg | Freq: Four times a day (QID) | INTRAMUSCULAR | Status: DC | PRN
  Administered 2019-04-17 – 2019-04-19 (×5): 4 mg via INTRAVENOUS
  Filled 2019-04-17 (×5): qty 2

## 2019-04-17 MED ORDER — METOPROLOL TARTRATE 12.5 MG HALF TABLET
12.5000 mg | ORAL_TABLET | Freq: Two times a day (BID) | ORAL | Status: DC
  Administered 2019-04-17 – 2019-04-21 (×8): 12.5 mg via ORAL
  Filled 2019-04-17 (×8): qty 1

## 2019-04-17 NOTE — H&P (Addendum)
History and Physical    Carl Lee [id number] DOB: 08-25-1955 DOA: 04/17/2019  PCP: [name], MD  Patient coming from: Home I have personally briefly reviewed patient's old medical records in [place]  Chief Complaint: Right upper quadrant pain, nausea and vomiting since 5 days  HPI: Carl Lee is a 63 y.o. male with medical history significant of hypertension, prediabetes, COPD, bladder cancer, coronary artery disease status post CABG, AAA status post repair, sleep apnea presents to emergency department due to right upper quadrant pain, nausea and vomiting since 5 days.  Patient reports pain severe in intensity, nonradiating, associated with nausea, vomiting, fever (maximum 100.1), chills, pruritus and dark urine.  He vomited twice in the last week.  He denies history of gallstones, family history of pancreatic cancer, decreased appetite, weight loss or night sweats.  He took oxycodone twice with no help.  He denies headache, blurry vision, chest pain, shortness of breath, palpitation, leg swelling, wheezing, sore throat, runny nose, sleep or bowel changes.  He lives with his wife and denies smoking, alcohol, illicit drug use.  ED Course: Patient vital signs stable, lipase normal, leukocytosis of 13.2.  Right upper quadrant ultrasound revealed no acute findings.  CT abdomen/pelvis: Shows borderline findings of possible acute cholecystitis.  Patient received IV fluids, Dilaudid and Zosyn in ED.  EDP consulted to general surgery for further management.  Review of Systems: As per HPI otherwise negative.    Past Medical History:  Diagnosis Date   AAA (abdominal aortic aneurysm) ([place]) 03/25/12   s/p endovascular repair   Adenomatous polyp 06/2009   Atrial tachycardia, paroxysmal ([place]) 06/18/2016   Bladder cancer ([place]) 08/16/2008   recurrence 2015 and 2018 treated with office fulguration   CAD (coronary artery disease), native coronary artery    S/p 3v CABG  01/2014    Cataract 2019   bilateral eyes   Childhood asthma    as a child   Complication of anesthesia    "takes me a year to get over"   Contact dermatitis    atypical [name])   Controlled diabetes mellitus type 2 with complications ([place]) [id number]   CAD    COPD (chronic obstructive pulmonary disease) ([place])    Coronary artery disease    a.2015: CABG w/ LIMA-LAD, RIMA-OM, SVG-RCA b. Cath on 07/10/15 w/ patent LIMA-LAD and RIMA-OM. SVG-RCA occluded but collaterals present   Diverticulosis 2013   severe by CT and colonoscopy   Environmental allergies    improved as ages   Fracture of lateral malleolus of left ankle 08/29/2013   Headache    hasn't had migraines in years   Hepatic steatosis 06/2015   by [place]   Hx of migraines    Hypertension    Lactose intolerance 11/14/2018   Endorses for years - avoids dairy products   Lumbosacral radiculopathy at S1 03/2012   left, with spinal stenosis (MRI 04/2013) improved with TF ESI L5/S1 and S1/2 ([facility] [place])   OSA (obstructive sleep apnea) 09/04/2014   Severe with AHI 35/hr, uses BIPAP   Overweight (BMI 25.0-29.9) 01/26/2017   Pre-diabetes    Prediabetes 06/08/2014   CAD    Resistance to clopidogrel 2015   drug metabolism panel run - asked to scan   Spinal stenosis    LS1 nerve root impingement from bulging disc   Vitamin B12 deficiency    Zenker diverticulum     Past Surgical History:  Procedure Laterality Date   ABDOMINAL AORTIC ANEURYSM REPAIR  03/25/12  endovascular   ABI  05/2013   WNL, L TBI low at 0.66   Bladder cancer  March 2010 and Oct. 2015   Ernst Spell) x 2   CARDIAC CATHETERIZATION N/A 07/10/2015   Procedure: Left Heart Cath and Cors/Grafts Angiography;  Surgeon: [name], MD;  Location: [facility];  Service: Cardiovascular;  Laterality: N/A;   CATARACT EXTRACTION W/ INTRAOCULAR LENS IMPLANT Bilateral 07/2018, 08/2018   COLONOSCOPY  06/2012   hyperplastic polyp,  diverticulosis ([place]) rec rpt 5 yrs   COLONOSCOPY WITH PROPOFOL N/A 08/10/2017   TA, rpt 5 yrs Ardis Hughs, Melene Plan, MD)   CORONARY ARTERY BYPASS GRAFT N/A 02/07/2014   Procedure: CORONARY ARTERY BYPASS GRAFTING (CABG);  Surgeon: [name], MD;  Location: [facility];  Service: Open Heart Surgery;  Laterality: N/A;  CABG X 3, BILATERAL LIMA, EVH   CYSTOSCOPY  08/16/08   Bladder Cancer   EPIDURAL BLOCK INJECTION Left 09/2014, 10/2014, 12/2014   medial L2,3,4, dorsal L5 ramus blocks x2, L L5/S1 and S1/2 transforaminal ESI ([facility] [place])   ESI  04/2013, 06/2013   L L5S1, S12 transforaminal ESI (Dr.  [name])   ESI Left 04/2014, 05/2014, 06/2014   L5/S1, S1/2; rpt; L4/5   ESI  03/2016   R L5/S1 interlaminar ESI   ESOPHAGEAL MANOMETRY N/A 08/10/2017   Procedure: ESOPHAGEAL MANOMETRY (EM);  Surgeon: [name], MD;  Location: [facility];  Service: Endoscopy;  Laterality: N/A;   ESOPHAGOGASTRODUODENOSCOPY (EGD) WITH PROPOFOL N/A 08/10/2017   Procedure: ESOPHAGOGASTRODUODENOSCOPY (EGD) WITH PROPOFOL;  Surgeon: [name], MD;  [facility];  Service: Endoscopy;  Laterality: N/A;   FULGURATION OF BLADDER TUMOR  01/2017   recurrent 66mm L lateral wall papillary transitional cell carcinoma ([place])   INTRAOPERATIVE TRANSESOPHAGEAL ECHOCARDIOGRAM N/A 02/07/2014   Procedure: INTRAOPERATIVE TRANSESOPHAGEAL ECHOCARDIOGRAM;  Surgeon: [name], MD;  Location: [facility];  Service: Open Heart Surgery;  Laterality: N/A;   LEFT HEART CATH AND CORS/GRAFTS ANGIOGRAPHY N/A 02/11/2017   Procedure: LEFT HEART CATH AND CORS/GRAFTS ANGIOGRAPHY;  Surgeon: [name], MD;  Location: [facility];  Service: Cardiovascular;  Laterality: N/A;   LEFT HEART CATH AND CORS/GRAFTS ANGIOGRAPHY N/A 02/02/2019   Procedure: LEFT HEART CATH AND CORS/GRAFTS ANGIOGRAPHY;  Surgeon: Belva Crome, MD;  Location: [facility];  Service: Cardiovascular;  Laterality: N/A;   LEFT  HEART CATHETERIZATION WITH CORONARY ANGIOGRAM N/A 02/01/2014   Procedure: LEFT HEART CATHETERIZATION WITH CORONARY ANGIOGRAM;  Surgeon: [name], MD;  [facility];  Service: Cardiovascular;  Laterality: N/A;   PRP epidural injection Left 11/2015   L5/S1, S1/2 transforaminal epidural PRP injections under fluoroscopy ([place])   REPLACEMENT TOTAL HIP W/  RESURFACING IMPLANTS Left    TONSILLECTOMY AND ADENOIDECTOMY  1973   TOTAL HIP ARTHROPLASTY Left 03/08/2017   TOTAL HIP ARTHROPLASTY Left 03/08/2017   Procedure: LEFT TOTAL HIP ARTHROPLASTY ANTERIOR APPROACH;  Surgeon: [name], MD;  Location: [facility];  Service: Orthopedics;  Laterality: Left;   VASECTOMY     ZENKER'S DIVERTICULECTOMY  09/2017     reports that he quit smoking about 5 years ago. His smoking use included cigarettes. He has a 40.00 pack-year smoking history. He has never used smokeless tobacco. He reports current alcohol use. He reports that he does not use drugs.  Allergies  Allergen Reactions   Codeine Other (See Comments)    Nightmares   Doxycycline Hives and Other (See Comments)  mild    Family History  Problem Relation Age of Onset   Diabetes Mother    Arrhythmia Mother        pacemaker   COPD Mother    Thyroid disease Mother    Hyperlipidemia Mother    Hypertension Mother    Bladder Cancer Mother    Diabetes Father    CAD Father 26       CHF, MI   Dementia Father    Heart attack Father    Diabetes Sister    Heart attack Maternal Grandmother    AAA (abdominal aortic aneurysm) Maternal Grandmother    Colon cancer Neg Hx    Stomach cancer Neg Hx     Prior to Admission medications   Medication Sig Start Date End Date Taking? Authorizing Provider  acetaminophen (TYLENOL) 325 MG tablet Take 2 tablets (650 mg total) by mouth every 6 (six) hours as needed for moderate pain or headache. 07/14/18  Yes [name], MD  albuterol [facility] HFA)  108 [phone] Base) MCG/ACT inhaler Inhale 2 puffs into the lungs every 4 (four) hours as needed for wheezing or shortness of breath. 03/21/19  Yes Magdalen Spatz, NP  amLODipine (NORVASC) 5 MG tablet TAKE 1 TABLET BY MOUTH EVERY DAY Patient taking differently: Take 5 mg by mouth daily.  02/08/19  Yes Josue Hector, MD  aspirin EC 81 MG tablet Take 1 tablet (81 mg total) by mouth daily. 02/21/19  Yes Imogene Burn, PA-C  atorvastatin (LIPITOR) 20 MG tablet TAKE 1 TABLET (20 MG TOTAL) BY MOUTH DAILY AT 6 PM. 08/16/18  Yes [name], MD  calcium carbonate (TUMS - DOSED IN MG ELEMENTAL CALCIUM) 500 MG chewable tablet Chew 2 tablets by mouth daily as needed for indigestion or heartburn.   Yes [provider]  Carboxymethylcellul-Glycerin (LUBRICATING EYE DROPS OP) Place 1 drop into both eyes daily as needed (dry eyes).   Yes [provider]  cholecalciferol (VITAMIN D3) 25 MCG (1000 UT) tablet Take 1,000 Units by mouth daily.   Yes [provider]  Glycopyrrolate-Formoterol (BEVESPI AEROSPHERE) 9-4.8 MCG/ACT AERO Inhale 2 puffs into the lungs 2 (two) times daily. 04/11/19  Yes Martyn Ehrich, NP  Guaifenesin [facility] MAXIMUM STRENGTH) 1200 MG TB12 Take 1 tablet (1,200 mg total) by mouth 2 (two) times daily. 04/11/19  Yes Magdalen Spatz, NP  ketotifen (ZADITOR) 0.025 % ophthalmic solution Place 1 drop into both eyes daily as needed (allergies).   Yes [provider]  metoprolol tartrate (LOPRESSOR) 25 MG tablet TAKE 0.5 TABLETS (12.5 MG TOTAL) BY MOUTH 2 (TWO) TIMES DAILY. 04/02/19  Yes Josue Hector, MD  nystatin (MYCOSTATIN) 100000 UNIT/ML suspension Swish and swallow 38mL four times daily x5-7days 04/11/19  Yes Magdalen Spatz, NP  oxyCODONE (ROXICODONE) 5 MG immediate release tablet Take 1 tablet (5 mg total) by mouth every 6 (six) hours as needed for severe pain. 02/28/19  Yes [name], MD  Respiratory Therapy Supplies (FLUTTER) DEVI Use as directed  04/11/19  Yes Magdalen Spatz, NP  Simethicone (GAS-X PO) Take 1 tablet by mouth daily as needed (gas).   Yes [provider]  tamsulosin (FLOMAX) 0.4 MG CAPS capsule Take 1 capsule (0.4 mg total) by mouth daily after supper. 11/08/18  Yes [name], MD  vitamin B-12 (CYANOCOBALAMIN) 500 MCG tablet Take 1 tablet (500 mcg total) by mouth every other day. Patient taking differently: Take 500 mcg by mouth every other day. Took on  Monday 12/29/17  Yes [name], MD  famotidine (PEPCID) 20 MG tablet TAKE 1 TABLET BY MOUTH TWICE A DAY 03/22/19   [name], MD  glucose blood (ONE TOUCH ULTRA TEST) test strip CHECK ONCE DAILY AND AS NEEDED 10/21/14   [provider]  ipratropium-albuterol (DUONEB) 0.5-2.5 (3) MG/3ML SOLN TAKE 3 MLS BY NEBULIZATION EVERY 6 (SIX) HOURS AS NEEDED FOR UP TO 30 DAYS. 01/29/19 02/28/19  Fenton Foy, NP  omeprazole (PRILOSEC) 40 MG capsule Take 1 capsule (40 mg total) by mouth daily. Take for 3 weeks daily then as needed Patient not taking: Reported on 04/17/2019 03/22/19   [name], MD    Physical Exam: Vitals:   04/17/19 1630 04/17/19 1700 04/17/19 1900 04/17/19 2002  BP: 115/77 113/85 133/79 124/83  Pulse:  96    Resp:  19 16 (!) 21  Temp:    98.7 F (37.1 C)  TempSrc:    Oral  SpO2:  91%      Constitutional: NAD, calm, comfortable Vitals:   04/17/19 1630 04/17/19 1700 04/17/19 1900 04/17/19 2002  BP: 115/77 113/85 133/79 124/83  Pulse:  96    Resp:  19 16 (!) 21  Temp:    98.7 F (37.1 C)  TempSrc:    Oral  SpO2:  91%     Constitutional: Alert and oriented x3, not in acute distress, communicating well.  Appears dusky Eyes: PERRL, lids and conjunctivae: Icteric  ENMT: Mucous membranes are dry t. Posterior pharynx clear of any exudate or lesions.Normal dentition.  Neck: normal, supple, no masses, no thyromegaly Respiratory: clear to auscultation bilaterally, no wheezing, no crackles. Normal respiratory effort.  No accessory muscle use.  Cardiovascular: Regular rate and rhythm, no murmurs / rubs / gallops. No extremity edema. 2+ pedal pulses. No carotid bruits.  Abdomen: Right upper quadrant tenderness positive, no guarding, no rigidity, no hepatosplenomegaly, bowel sounds positive. Musculoskeletal: no clubbing / cyanosis. No joint deformity upper and lower extremities. Good ROM, no contractures. Normal muscle tone.  Skin: no rashes, lesions, ulcers. No induration Neurologic: CN 2-12 grossly intact. Sensation intact, DTR normal. Strength 5/5 in all 4.  Psychiatric: Normal judgment and insight. Alert and oriented x 3. Normal mood.    Labs on Admission: I have personally reviewed following labs and imaging studies  CBC: Recent Labs  Lab 04/17/19 1132  WBC 13.2*  HGB 17.3*  HCT 49.8  MCV 94.0  PLT [id number]   Basic Metabolic Panel: Recent Labs  Lab 04/17/19 1132  NA 137  K 3.9  CL 100  CO2 26  GLUCOSE 138*  BUN 15  CREATININE 0.89  CALCIUM 9.2   GFR: Estimated Creatinine Clearance: 95.6 mL/min (by C-G formula based on SCr of 0.89 mg/dL). Liver Function Tests: Recent Labs  Lab 04/17/19 1132  AST 519*  ALT 223*  ALKPHOS 280*  BILITOT 5.5*  PROT 7.4  ALBUMIN 3.9   Recent Labs  Lab 04/17/19 1132  LIPASE 17   No results for input(s): AMMONIA in the last 168 hours. Coagulation Profile: No results for input(s): INR, PROTIME in the last 168 hours. Cardiac Enzymes: No results for input(s): CKTOTAL, CKMB, CKMBINDEX, TROPONINI in the last 168 hours. BNP (last 3 results) No results for input(s): PROBNP in the last 8760 hours. HbA1C: No results for input(s): HGBA1C in the last 72 hours. CBG: No results for input(s): GLUCAP in the last 168 hours. Lipid Profile: Recent Labs    04/17/19 1800  CHOL 85  HDL 23*  LDLCALC 49  TRIG 67  CHOLHDL 3.7   Thyroid Function Tests: No results for input(s): TSH, T4TOTAL, FREET4, T3FREE, THYROIDAB in the last 72 hours. Anemia Panel: No  results for input(s): VITAMINB12, FOLATE, FERRITIN, TIBC, IRON, RETICCTPCT in the last 72 hours. Urine analysis:    Component Value Date/Time   COLORURINE AMBER (A) 04/17/2019 1224   APPEARANCEUR CLEAR 04/17/2019 1224   LABSPEC 1.020 04/17/2019 1224   PHURINE 5.0 04/17/2019 1224   GLUCOSEU NEGATIVE 04/17/2019 1224   HGBUR NEGATIVE 04/17/2019 1224   BILIRUBINUR MODERATE (A) 04/17/2019 1224   BILIRUBINUR negative 11/08/2018 1022   KETONESUR 5 (A) 04/17/2019 1224   PROTEINUR NEGATIVE 04/17/2019 1224   UROBILINOGEN 0.2 11/08/2018 1022   UROBILINOGEN 1.0 02/06/2014 1436   NITRITE NEGATIVE 04/17/2019 1224   LEUKOCYTESUR NEGATIVE 04/17/2019 1224    Radiological Exams on Admission: Ct Abdomen Pelvis W Contrast  Result Date: 04/17/2019 CLINICAL DATA:  63 year old presenting with acute onset of RIGHT UPPER QUADRANT and RIGHT LOWER QUADRANT abdominal pain and fever. Surgical history includes TURBT and endovascular abdominal aortic aneurysm repair. EXAM: CT ABDOMEN AND PELVIS WITH CONTRAST TECHNIQUE: Multidetector CT imaging of the abdomen and pelvis was performed using the standard protocol following bolus administration of intravenous contrast. CONTRAST:  163mL OMNIPAQUE IOHEXOL 300 MG/ML IV. COMPARISON:  12/26/2017 and earlier. FINDINGS: Lower chest: Visualized lung bases clear. Normal heart size. Hepatobiliary: Geographic areas of hepatic steatosis involving the RIGHT lobe. No hepatic parenchymal masses. Calcified granuloma in the lateral segment LEFT lobe. Mildly distended gallbladder with mucosal enhancement, but no evidence of cholelithiasis. No biliary ductal dilation. Pancreas: Normal in appearance without evidence of mass, ductal dilation, or inflammation. Spleen: Normal in size and appearance. Two small accessory splenules MEDIAL and INFERIOR to the spleen. Adrenals/Urinary Tract: Normal appearing adrenal glands. Benign cyst arising from the LOWER pole the RIGHT kidney. Focal areas of scarring  in the UPPER pole RIGHT kidney and mid LEFT kidney. No urinary tract calculi on either side. No hydronephrosis. No solid renal masses. Normal appearing decompressed urinary bladder (allowing for the metallic beam hardening streak artifact from the LEFT hip prosthesis which partially obscures the bladder). Stomach/Bowel: Stomach normal in appearance for the degree of distention. Large wide-mouth diverticulum arising from the junction of the descending and transverse duodenum, containing gas. Remaining small bowel normal in appearance, though several small bowel loops are present LATERAL to the descending colon. Diffuse colonic diverticulosis, extensive in the distal descending and sigmoid colon, without evidence of acute diverticulitis. Normal appendix in the RIGHT UPPER pelvis. Vascular/Lymphatic: Prior endovascular abdominal aortic aneurysm repair with aorto bi-iliac stent grafts. The grafts are patent. Mural thrombus is present within the aortic portion of the stent graft. Native aorto-iliofemoral atherosclerosis. Normal-appearing portal venous and systemic venous systems. No pathologic lymphadenopathy. Reproductive: Normal-appearing prostate gland and seminal vesicles for patient age. Other: None. Musculoskeletal: LEFT hip arthroplasty with anatomic alignment. Thoracolumbar dextroscoliosis. Osseous demineralization. Degenerative disc disease and spondylosis at L4-5 and L5-S1. Diffuse facet degenerative changes throughout the lumbar spine. No acute findings. IMPRESSION: 1. Borderline findings of possible acute cholecystitis. Consider RIGHT UPPER QUADRANT abdominal ultrasound to confirm or deny this possibility. 2. No acute abnormalities otherwise involving the abdomen or pelvis. 3. Geographic areas of hepatic steatosis involving the RIGHT lobe. No hepatic parenchymal masses. 4. Large wide-mouth diverticulum arising from the junction of the descending and transverse duodenum. 5. Diffuse colonic diverticulosis  without evidence of acute diverticulitis. 6. Prior endovascular abdominal aortic aneurysm repair with aorto bi-iliac stent grafts. The  grafts are patent, though mural thrombus is present within the aortic portion of the graft. Aortic Atherosclerosis (ICD10-I70.0). Electronically Signed   By: Evangeline Dakin M.D.   On: 04/17/2019 16:27   Dg Chest Portable 1 View  Result Date: 04/17/2019 CLINICAL DATA:  Cough. EXAM: PORTABLE CHEST 1 VIEW COMPARISON:  11/10/2018 FINDINGS: Stable changes from prior CABG surgery. Cardiac silhouette is normal in size and configuration. No mediastinal or hilar masses. Lungs are hyperexpanded. Prominent bronchovascular markings most evident in the lower lungs. Minor linear scarring at the right lateral lung base, stable. Lungs are otherwise clear. No pleural effusion or pneumothorax. Skeletal structures are demineralized but grossly intact. IMPRESSION: 1. No acute cardiopulmonary disease. 2. Hyperexpanded lungs suggests COPD. Stable changes from prior CABG surgery. Electronically Signed   By: Lajean Manes M.D.   On: 04/17/2019 14:49   US Abdomen Limited Ruq  Result Date: 04/17/2019 CLINICAL DATA:  Right upper quadrant pain for 6 days. EXAM: ULTRASOUND ABDOMEN LIMITED RIGHT UPPER QUADRANT COMPARISON:  CT angiogram of the abdomen and pelvis 12/26/2017. FINDINGS: Gallbladder: There is some sludge within the gallbladder. No gallstones or wall thickening visualized. No sonographic Murphy sign noted by sonographer. Common bile duct: Diameter: 0.5 cm Liver: Echogenicity is increased. No focal lesion. Portal vein is patent on color Doppler imaging with normal direction of blood flow towards the liver. Other: None. IMPRESSION: Gallbladder sludge.  Negative for stones or cholecystitis. Fatty infiltration of the liver. Electronically Signed   By: Inge Rise M.D.   On: 04/17/2019 13:24    EKG: Normal sinus rhythm, no acute ST-T wave changes noted.  Assessment/Plan Principal  Problem:   Acute cholecystitis Active Problems:   Hypertension   History of bladder cancer   History of AAA (abdominal aortic aneurysm) repair   Dyslipidemia   COPD, moderate (HCC)   OSA (obstructive sleep apnea)   Coronary artery disease   GERD (gastroesophageal reflux disease)   Hyperbilirubinemia   Prediabetes   Elevated transaminase level   Acute cholecystitis: -Patient presented with right upper quadrant pain, low-grade fever, chills. -Right upper quadrant ultrasound abdomen and CT abdomen/pelvis as above -Patient is currently afebrile, leukocytosis of 13.2.  Troponin and lactic acid: WNL.  COVID-19: Pending.  Lipase: WNL.  Elevated liver enzymes ALT: 223, AST: 519, alkaline phosphatase: 280, total bilirubin: 5.5.   -Received IV fluids, Zosyn and Dilaudid in the ED. -We will admit patient for close monitoring. -EDP consulted general surgery who recommended GI consult for MRCP-I called Dr. Henrene Pastor and discussed about the patient-patient will be evaluated by Dr. Beather Arbour recommendation. -We will keep him n.p.o.  Continue IV fluids, Zofran PRN for nausea and vomiting.  Continue Protonix and IV Zosyn. -Blood culture is obtained and is pending.  We will check his PT/INR and acute hepatitis panel. -He needs cardiology clearance if GI or general surgery proceed for any procedure.  Hypertension: Blood pressure is stable -Continue metoprolol and amlodipine and monitor his blood pressure closely.  Hyperlipidemia: Hold statin  Coronary artery disease status post CABG: -Hold aspirin, statin, continue metoprolol -On telemetry.  Troponin negative, EKG: No acute changes.  COPD: Not in acute exacerbation: -Patient is currently on 2 L of oxygen via nasal cannula in ED.  On continuous pulse ox -Continue DuoNebs as needed. wean off his oxygen as tolerated.  Abdominal aortic aneurysm status post repair: Aware  Sleep apnea: On CPAP -We will continue.  GERD: Continue Protonix  BPH:  Stable -Continue Flomax.  Hepatic steatosis: -Finding noted on CT  abdomen and right upper quadrant ultrasound -Check lipid panel  DVT prophylaxis: TED/SCD\ Code Status: Full code Family Communication: Wife present at bedside.  Plan of care discussed with patient and his wife in length and they verbalized understanding and agreed with it. Disposition Plan: To be determined Consults called: General surgery and gastroenterology  admission status: Inpatient   Mckinley Jewel MD [facility] Pager [phone]  If 7PM-7AM, please contact night-coverage [email or web address] Password [email or web address]  04/17/2019, 8:21 PM

## 2019-04-17 NOTE — Progress Notes (Signed)
Patient ID: Carl Lee, male   DOB: 1955/08/06, 63 y.o.   MRN: [id number]   General Surgery is aware of this patient and will leave a formal consult note in the morning.  The patient has no findings of acute cholecystitis on [place] and borderline findings on CT scan (not as sensitive).  Transaminases elevated, total bilirubin 5.5.  Multiple medical issues.  Prior to any consideration for cholecystectomy, the patient needs MRCP and GI consult.  He will also need cardiac clearance.    Hold aspirin Do not advance diet past clear liquids  Imogene Burn. Georgette Dover, MD, [facility]  General/ Trauma Surgery Bee[place] [phone]  04/17/2019 5:42 PM

## 2019-04-17 NOTE — ED Provider Notes (Addendum)
Patient signout to me by Dr. Regenia Skeeter pending abdominal CT which is positive for possible acute cholecystitis.  Transaminases as well as bilirubin is elevated.  Will consult general surgery as patient will likely need to have an mrcp and be admitted to the hospital.  4:44 PM Spoke with general surgery, Dr. [name], who recommends medicine admission   Lacretia Leigh, MD 04/17/19 1639    Lacretia Leigh, MD 04/17/19 1643    Lacretia Leigh, MD 04/17/19 1644

## 2019-04-17 NOTE — ED Triage Notes (Addendum)
Pt here from home for RUQ abdominal pain and tenderness since Thursday. Endorses nausea, vomit x 2, and diarrhea that started today. Hx of CABG and COPD and sts he's in so much pain it's affecting his breathing. Endorses fever of up to 100.1 at home, afebrile in triage.

## 2019-04-17 NOTE — ED Notes (Signed)
Dr. [name] paged to [place] Dr. Zenia Resides paged by Levada Dy

## 2019-04-17 NOTE — Progress Notes (Signed)
Please let patient know sputum culture appears normal. Few yeast likely from inhaler use. Rinse mouth out well after use.

## 2019-04-17 NOTE — ED Notes (Signed)
Patient transported to Ultrasound 

## 2019-04-17 NOTE — ED Notes (Signed)
Urine culture collected and sent to main lab. 

## 2019-04-17 NOTE — Progress Notes (Signed)
Pharmacy Antibiotic Note  Carl Lee is a 63 y.o. male admitted on 04/17/2019 with an intra-abdominal infection. He presented with right upper quadrant abdominal pain, which has been ongoing since 10/01. He has had a couple of low grade fevers and 2 episodes of vomiting in the past week. Pharmacy has been consulted for zosyn dosing.  Pt has had no acute findings of acute cholecystitis but borderline findings on CT. LFTs are elevated and T bili is elevated at 5.5. WBC is elevated at 13.2, pt is afebrile.  Plan: Zosyn 3.375g IV q8h (4 hour infusion). F/U on LOT Monitor Renal function, clinical status, WBC, temp, and cultures Pharmacy will sign on since no dose adjustments anticipated.    Temp (24hrs), Avg:98.8 F (37.1 C), Min:98.8 F (37.1 C), Max:98.8 F (37.1 C)  Recent Labs  Lab 04/17/19 1132 04/17/19 1233 04/17/19 1434  WBC 13.2*  --   --   CREATININE 0.89  --   --   LATICACIDVEN  --  1.4 1.2    Estimated Creatinine Clearance: 95.6 mL/min (by C-G formula based on SCr of 0.89 mg/dL).    Allergies  Allergen Reactions  . Codeine Other (See Comments)    Nightmares  . Doxycycline Hives and Other (See Comments)    mild    Antimicrobials this admission: Zosyn 10/06>>   Microbiology results: 10/06 BCx: Sent 10/06 COVID: Sent  Thank you for allowing pharmacy to be a part of this patient's care.  Sherren Kerns, PharmD PGY1 Acute Care Pharmacy Resident 04/17/2019 6:24 PM

## 2019-04-17 NOTE — ED Provider Notes (Signed)
[facility] EMERGENCY DEPARTMENT Provider Note   CSN: [id number] Arrival date & time: 04/17/19  1117     History   Chief Complaint Chief Complaint  Patient presents with  . Abdominal Pain    HPI Carl Lee is a 63 y.o. male.     HPI  63 year old male presents with right upper quadrant abdominal pain.  Has been ongoing since the evening of 10/1.  Shortly after eating any type of food his pain worsens.  Otherwise it has been waxing and waning.  Has had a couple low-grade fevers up to 100.1.  2 episodes of vomiting in total over the past week.  Has some chronic chest pain that he thinks is from his prior CABG wound but no new type of chest pain.  He is getting over pneumonia.  He has noticed that his urine has been dark.  Over the last day or so he is developed a little bit of lower abdominal discomfort.  Has tried oxycodone twice due to how severe the pain has been. Pain goes through to his back.  Past Medical History:  Diagnosis Date  . AAA (abdominal aortic aneurysm) ([place]) 03/25/12   s/p endovascular repair  . Adenomatous polyp 06/2009  . Atrial tachycardia, paroxysmal ([place]) 06/18/2016  . Bladder cancer ([place]) 08/16/2008   recurrence 2015 and 2018 treated with office fulguration  . CAD (coronary artery disease), native coronary artery    S/p 3v CABG 01/2014   . Cataract 2019   bilateral eyes  . Childhood asthma    as a child  . Complication of anesthesia    "takes me a year to get over"  . Contact dermatitis    atypical [name])  . Controlled diabetes mellitus type 2 with complications ([place]) [id number]   CAD   . COPD (chronic obstructive pulmonary disease) ([place])   . Coronary artery disease    a.2015: CABG w/ LIMA-LAD, RIMA-OM, SVG-RCA b. Cath on 07/10/15 w/ patent LIMA-LAD and RIMA-OM. SVG-RCA occluded but collaterals present  . Diverticulosis 2013   severe by CT and colonoscopy  . Environmental allergies    improved as ages  . Fracture of lateral  malleolus of left ankle 08/29/2013  . Headache    hasn't had migraines in years  . Hepatic steatosis 06/2015   by [place]  . Hx of migraines   . Hypertension   . Lactose intolerance 11/14/2018   Endorses for years - avoids dairy products  . Lumbosacral radiculopathy at S1 03/2012   left, with spinal stenosis (MRI 04/2013) improved with TF ESI L5/S1 and S1/2 ([place])  . OSA (obstructive sleep apnea) 09/04/2014   Severe with AHI 35/hr, uses BIPAP  . Overweight (BMI 25.0-29.9) 01/26/2017  . Pre-diabetes   . Prediabetes 06/08/2014   CAD   . Resistance to clopidogrel 2015   drug metabolism panel run - asked to scan  . Spinal stenosis    LS1 nerve root impingement from bulging disc  . Vitamin B12 deficiency   . Zenker diverticulum     Patient Active Problem List   Diagnosis Date Noted  . Preoperative clearance 01/23/2019  . Gallstones 11/14/2018  . Lactose intolerance 11/14/2018  . Cough 10/10/2018  . Aortic atherosclerosis ([place]) 08/05/2018  . Personal history of tobacco use, presenting hazards to health 07/18/2018  . GERD (gastroesophageal reflux disease) 07/16/2018  . Advanced care planning/counseling discussion 07/14/2018  . Hives 07/14/2018  . Cataracts, bilateral 05/09/2018  . Macular degeneration, dry 05/09/2018  .  Bilateral hearing loss 12/29/2017  . Chronic foot pain 12/29/2017  . Acute cystitis 11/24/2017  . Diverticulosis of colon without hemorrhage   . Sore throat 08/02/2017  . Urinary frequency 06/30/2017  . Status post total replacement of left hip 03/08/2017  . Abnormal nuclear stress test   . Obesity, Class I, BMI 30-34.9 01/26/2017  . Radiculopathy due to lumbar intervertebral disc disorder 10/21/2016  . Osteoarthritis of left hip 08/16/2016  . Atrial tachycardia, paroxysmal ([place]) 06/18/2016  . Chronic fatigue 04/27/2016  . Coronary artery disease   . Abdominal discomfort 06/24/2015  . Hepatic steatosis 06/12/2015  . Dyspnea 05/01/2015  . COPD exacerbation  ([place]) 12/17/2014  . Right trigeminal neuralgia 11/27/2014  . Chronic chest pain 10/30/2014  . Vitamin B12 deficiency   . OSA (obstructive sleep apnea) 09/04/2014  . Headache 06/20/2014  . Controlled diabetes mellitus type 2 with complications ([place]) [id number]  . Zenker diverticulum 04/19/2014  . COPD, moderate ([place]) 03/26/2014  . S/P CABG x 3 02/07/2014  . Occlusion and stenosis of vertebral artery 11/12/2013  . Skin rash 04/17/2013  . Healthcare maintenance 01/09/2013  . Dyslipidemia 01/09/2013  . Lumbosacral radiculopathy at S1 09/14/2012  . Migraine   . Arthritis   . History of AAA (abdominal aortic aneurysm) repair 05/01/2012  . Adenomatous polyps 03/24/2012  . Ex-smoker 03/24/2012  . Hypertension 03/24/2012  . History of bladder cancer 03/24/2012    Past Surgical History:  Procedure Laterality Date  . ABDOMINAL AORTIC ANEURYSM REPAIR  03/25/12   endovascular  . ABI  05/2013   WNL, L TBI low at 0.66  . Bladder cancer  March 2010 and Oct. 2015   Ernst Spell) x 2  . CARDIAC CATHETERIZATION N/A 07/10/2015   Procedure: Left Heart Cath and Cors/Grafts Angiography;  Surgeon: [name], MD;  Location: [facility] CV LAB;  Service: Cardiovascular;  Laterality: N/A;  . CATARACT EXTRACTION W/ INTRAOCULAR LENS IMPLANT Bilateral 07/2018, 08/2018  . COLONOSCOPY  06/2012   hyperplastic polyp, diverticulosis ([place]) rec rpt 5 yrs  . COLONOSCOPY WITH PROPOFOL N/A 08/10/2017   TA, rpt 5 yrs Ardis Hughs, Melene Plan, MD)  . CORONARY ARTERY BYPASS GRAFT N/A 02/07/2014   Procedure: CORONARY ARTERY BYPASS GRAFTING (CABG);  Surgeon: [name], MD;  Location: [facility];  Service: Open Heart Surgery;  Laterality: N/A;  CABG X 3, BILATERAL LIMA, EVH  . CYSTOSCOPY  08/16/08   Bladder Cancer  . EPIDURAL BLOCK INJECTION Left 09/2014, 10/2014, 12/2014   medial L2,3,4, dorsal L5 ramus blocks x2, L L5/S1 and S1/2 transforaminal ESI ([place])  . ESI  04/2013, 06/2013   L L5S1, S12  transforaminal ESI (Dr.  [name])  . ESI Left 04/2014, 05/2014, 06/2014   L5/S1, S1/2; rpt; L4/5  . ESI  03/2016   R L5/S1 interlaminar ESI  . ESOPHAGEAL MANOMETRY N/A 08/10/2017   Procedure: ESOPHAGEAL MANOMETRY (EM);  Surgeon: [name], MD;  Location: [facility];  Service: Endoscopy;  Laterality: N/A;  . ESOPHAGOGASTRODUODENOSCOPY (EGD) WITH PROPOFOL N/A 08/10/2017   Procedure: ESOPHAGOGASTRODUODENOSCOPY (EGD) WITH PROPOFOL;  Surgeon: [name], MD;  Location: [facility];  Service: Endoscopy;  Laterality: N/A;  . FULGURATION OF BLADDER TUMOR  01/2017   recurrent 27mm L lateral wall papillary transitional cell carcinoma ([place])  . INTRAOPERATIVE TRANSESOPHAGEAL ECHOCARDIOGRAM N/A 02/07/2014   Procedure: INTRAOPERATIVE TRANSESOPHAGEAL ECHOCARDIOGRAM;  Surgeon: [name], MD;  Location: [facility];  Service: Open Heart Surgery;  Laterality: N/A;  . LEFT HEART CATH AND CORS/GRAFTS  ANGIOGRAPHY N/A 02/11/2017   Procedure: LEFT HEART CATH AND CORS/GRAFTS ANGIOGRAPHY;  Surgeon: [name], MD;  Location: [facility];  Service: Cardiovascular;  Laterality: N/A;  . LEFT HEART CATH AND CORS/GRAFTS ANGIOGRAPHY N/A 02/02/2019   Procedure: LEFT HEART CATH AND CORS/GRAFTS ANGIOGRAPHY;  Surgeon: Belva Crome, MD;  Location: [facility] LAB;  Service: Cardiovascular;  Laterality: N/A;  . LEFT HEART CATHETERIZATION WITH CORONARY ANGIOGRAM N/A 02/01/2014   Procedure: LEFT HEART CATHETERIZATION WITH CORONARY ANGIOGRAM;  Surgeon: [name], MD;  Location: [facility] CATH LAB;  Service: Cardiovascular;  Laterality: N/A;  . PRP epidural injection Left 11/2015   L5/S1, S1/2 transforaminal epidural PRP injections under fluoroscopy ([place])  . REPLACEMENT TOTAL HIP W/  RESURFACING IMPLANTS Left   . TONSILLECTOMY AND ADENOIDECTOMY  1973  . TOTAL HIP ARTHROPLASTY Left 03/08/2017  . TOTAL HIP ARTHROPLASTY Left 03/08/2017   Procedure: LEFT TOTAL HIP ARTHROPLASTY  ANTERIOR APPROACH;  Surgeon: [name], MD;  Location: [facility];  Service: Orthopedics;  Laterality: Left;  [name] VASECTOMY    . ZENKER'S DIVERTICULECTOMY  09/2017        Home Medications    Prior to Admission medications   Medication Sig Start Date End Date Taking? Authorizing Provider  acetaminophen (TYLENOL) 325 MG tablet Take 2 tablets (650 mg total) by mouth every 6 (six) hours as needed for moderate pain or headache. 07/14/18  Yes [name], MD  albuterol [facility] HFA) [phone] Base) MCG/ACT inhaler Inhale 2 puffs into the lungs every 4 (four) hours as needed for wheezing or shortness of breath. 03/21/19  Yes Magdalen Spatz, NP  amLODipine (NORVASC) 5 MG tablet TAKE 1 TABLET BY MOUTH EVERY DAY Patient taking differently: Take 5 mg by mouth daily.  02/08/19  Yes Josue Hector, MD  aspirin EC 81 MG tablet Take 1 tablet (81 mg total) by mouth daily. 02/21/19  Yes Imogene Burn, PA-C  atorvastatin (LIPITOR) 20 MG tablet TAKE 1 TABLET (20 MG TOTAL) BY MOUTH DAILY AT 6 PM. 08/16/18  Yes [name], MD  calcium carbonate (TUMS - DOSED IN MG ELEMENTAL CALCIUM) 500 MG chewable tablet Chew 2 tablets by mouth daily as needed for indigestion or heartburn.   Yes [provider]  Carboxymethylcellul-Glycerin (LUBRICATING EYE DROPS OP) Place 1 drop into both eyes daily as needed (dry eyes).   Yes [provider]  cholecalciferol (VITAMIN D3) 25 MCG (1000 UT) tablet Take 1,000 Units by mouth daily.   Yes [provider]  Glycopyrrolate-Formoterol (BEVESPI AEROSPHERE) 9-4.8 MCG/ACT AERO Inhale 2 puffs into the lungs 2 (two) times daily. 04/11/19  Yes Martyn Ehrich, NP  Guaifenesin [facility] MAXIMUM STRENGTH) 1200 MG TB12 Take 1 tablet (1,200 mg total) by mouth 2 (two) times daily. 04/11/19  Yes Magdalen Spatz, NP  ketotifen (ZADITOR) 0.025 % ophthalmic solution Place 1 drop into both eyes daily as needed (allergies).   Yes [provider]   metoprolol tartrate (LOPRESSOR) 25 MG tablet TAKE 0.5 TABLETS (12.5 MG TOTAL) BY MOUTH 2 (TWO) TIMES DAILY. 04/02/19  Yes Josue Hector, MD  nystatin (MYCOSTATIN) 100000 UNIT/ML suspension Swish and swallow 81mL four times daily x5-7days 04/11/19  Yes Magdalen Spatz, NP  oxyCODONE (ROXICODONE) 5 MG immediate release tablet Take 1 tablet (5 mg total) by mouth every 6 (six) hours as needed for severe pain. 02/28/19  Yes [name], MD  Respiratory Therapy Supplies (FLUTTER) DEVI Use as directed 04/11/19  Yes  Magdalen Spatz, NP  Simethicone (GAS-X PO) Take 1 tablet by mouth daily as needed (gas).   Yes [provider]  tamsulosin (FLOMAX) 0.4 MG CAPS capsule Take 1 capsule (0.4 mg total) by mouth daily after supper. 11/08/18  Yes [name], MD  vitamin B-12 (CYANOCOBALAMIN) 500 MCG tablet Take 1 tablet (500 mcg total) by mouth every other day. Patient taking differently: Take 500 mcg by mouth every other day. Took on Monday 12/29/17  Yes [name], MD  famotidine (PEPCID) 20 MG tablet TAKE 1 TABLET BY MOUTH TWICE A DAY 03/22/19   [name], MD  glucose blood (ONE TOUCH ULTRA TEST) test strip CHECK ONCE DAILY AND AS NEEDED 10/21/14   [provider]  ipratropium-albuterol (DUONEB) 0.5-2.5 (3) MG/3ML SOLN TAKE 3 MLS BY NEBULIZATION EVERY 6 (SIX) HOURS AS NEEDED FOR UP TO 30 DAYS. 01/29/19 02/28/19  Fenton Foy, NP  omeprazole (PRILOSEC) 40 MG capsule Take 1 capsule (40 mg total) by mouth daily. Take for 3 weeks daily then as needed Patient not taking: Reported on 04/17/2019 03/22/19   [name], MD    Family History Family History  Problem Relation Age of Onset  . Diabetes Mother   . Arrhythmia Mother        pacemaker  . COPD Mother   . Thyroid disease Mother   . Hyperlipidemia Mother   . Hypertension Mother   . Bladder Cancer Mother   . Diabetes Father   . CAD Father 50       CHF, MI  . Dementia Father   . Heart attack Father   .  Diabetes Sister   . Heart attack Maternal Grandmother   . AAA (abdominal aortic aneurysm) Maternal Grandmother   . Colon cancer Neg Hx   . Stomach cancer Neg Hx     Social History Social History   Tobacco Use  . Smoking status: Former Smoker    Packs/day: 1.00    Years: 40.00    Pack years: 40.00    Types: Cigarettes    Quit date: 02/05/2014    Years since quitting: 5.1  . Smokeless tobacco: Never Used  Substance Use Topics  . Alcohol use: Yes    Alcohol/week: 0.0 standard drinks    Comment: 3-4 drinks per month  . Drug use: No     Allergies   Codeine and Doxycycline   Review of Systems Review of Systems  Constitutional: Positive for fever.  Respiratory: Positive for cough and shortness of breath.   Cardiovascular: Positive for chest pain.  Gastrointestinal: Positive for abdominal pain and vomiting.  All other systems reviewed and are negative.    Physical Exam Updated Vital Signs BP (!) 151/93   Pulse 90   Temp 98.8 F (37.1 C) (Oral)   Resp 20   SpO2 93%   Physical Exam Vitals signs and nursing note reviewed.  Constitutional:      General: He is not in acute distress.    Appearance: He is well-developed. He is obese. He is not ill-appearing or diaphoretic.  HENT:     Head: Normocephalic and atraumatic.     Right Ear: External ear normal.     Left Ear: External ear normal.     Nose: Nose normal.  Eyes:     General:        Right eye: No discharge.        Left eye: No discharge.  Neck:     Musculoskeletal: Neck supple.  Cardiovascular:  Rate and Rhythm: Normal rate and regular rhythm.     Heart sounds: Normal heart sounds.  Pulmonary:     Effort: Pulmonary effort is normal.     Breath sounds: Normal breath sounds.  Abdominal:     Palpations: Abdomen is soft.     Tenderness: There is abdominal tenderness in the right upper quadrant.  Skin:    General: Skin is warm and dry.  Neurological:     Mental Status: He is alert.  Psychiatric:         Mood and Affect: Mood is not anxious.      ED Treatments / Results  Labs (all labs ordered are listed, but only abnormal results are displayed) Labs Reviewed  COMPREHENSIVE METABOLIC PANEL - Abnormal; Notable for the following components:      Result Value   Glucose, Bld 138 (*)    AST 519 (*)    ALT 223 (*)    Alkaline Phosphatase 280 (*)    Total Bilirubin 5.5 (*)    All other components within normal limits  CBC - Abnormal; Notable for the following components:   WBC 13.2 (*)    Hemoglobin 17.3 (*)    All other components within normal limits  URINALYSIS, ROUTINE W REFLEX MICROSCOPIC - Abnormal; Notable for the following components:   Color, Urine AMBER (*)    Bilirubin Urine MODERATE (*)    Ketones, ur 5 (*)    All other components within normal limits  CULTURE, BLOOD (ROUTINE X 2)  CULTURE, BLOOD (ROUTINE X 2)  LIPASE, BLOOD  LACTIC ACID, PLASMA  LACTIC ACID, PLASMA  TROPONIN I (HIGH SENSITIVITY)  TROPONIN I (HIGH SENSITIVITY)    EKG EKG Interpretation  Date/Time:  Tuesday April 17 2019 11:26:27 EDT Ventricular Rate:  91 PR Interval:  154 QRS Duration: 82 QT Interval:  362 QTC Calculation: 445 R Axis:   86 Text Interpretation:  Normal sinus rhythm Nonspecific ST and T wave abnormality Abnormal ECG Confirmed by Sherwood Gambler [phone]) on 04/17/2019 12:05:17 PM   Radiology Dg Chest Portable 1 View  Result Date: 04/17/2019 CLINICAL DATA:  Cough. EXAM: PORTABLE CHEST 1 VIEW COMPARISON:  11/10/2018 FINDINGS: Stable changes from prior CABG surgery. Cardiac silhouette is normal in size and configuration. No mediastinal or hilar masses. Lungs are hyperexpanded. Prominent bronchovascular markings most evident in the lower lungs. Minor linear scarring at the right lateral lung base, stable. Lungs are otherwise clear. No pleural effusion or pneumothorax. Skeletal structures are demineralized but grossly intact. IMPRESSION: 1. No acute cardiopulmonary disease. 2.  Hyperexpanded lungs suggests COPD. Stable changes from prior CABG surgery. Electronically Signed   By: Lajean Manes M.D.   On: 04/17/2019 14:49   US Abdomen Limited Ruq  Result Date: 04/17/2019 CLINICAL DATA:  Right upper quadrant pain for 6 days. EXAM: ULTRASOUND ABDOMEN LIMITED RIGHT UPPER QUADRANT COMPARISON:  CT angiogram of the abdomen and pelvis 12/26/2017. FINDINGS: Gallbladder: There is some sludge within the gallbladder. No gallstones or wall thickening visualized. No sonographic Murphy sign noted by sonographer. Common bile duct: Diameter: 0.5 cm Liver: Echogenicity is increased. No focal lesion. Portal vein is patent on color Doppler imaging with normal direction of blood flow towards the liver. Other: None. IMPRESSION: Gallbladder sludge.  Negative for stones or cholecystitis. Fatty infiltration of the liver. Electronically Signed   By: Inge Rise M.D.   On: 04/17/2019 13:24    Procedures Procedures (including critical care time)  Medications Ordered in ED Medications  HYDROmorphone (DILAUDID) injection  1 mg (has no administration in time range)  ondansetron (ZOFRAN) injection 4 mg (has no administration in time range)  HYDROmorphone (DILAUDID) injection 1 mg (1 mg Intravenous Given 04/17/19 1249)  piperacillin-tazobactam (ZOSYN) IVPB 3.375 g (0 g Intravenous Stopped 04/17/19 1331)  sodium chloride 0.9 % bolus 1,000 mL (1,000 mLs Intravenous New Bag/Given 04/17/19 1332)  iohexol (OMNIPAQUE) 300 MG/ML solution 100 mL (100 mLs Intravenous Contrast Given 04/17/19 1523)     Initial Impression / Assessment and Plan / ED Course  I have reviewed the triage vital signs and the nursing notes.  Pertinent labs & imaging results that were available during my care of the patient were reviewed by me and considered in my medical decision making (see chart for details).        Patient's pain is probably biliary.  The initial right upper quadrant ultrasound is not very impressive.  Will  get CT given his report of low-grade fevers and elevated WBC.  Will likely need admission.  He was given IV Zosyn.  Care to Dr. Zenia Resides.  Final Clinical Impressions(s) / ED Diagnoses   Final diagnoses:  Abdominal pain, RUQ (right upper quadrant)    ED Discharge Orders    None       Sherwood Gambler, MD 04/17/19 1546

## 2019-04-17 NOTE — Telephone Encounter (Signed)
Pt last seen 03/22/19 and omeprazole is not helping the acid reflux; pt is not eating a lot; pt has eaten potato soup, eggs and toast for last few days. On 04/12/19 pt started with N& vomited x 1 on 04/12/19;pain on and off under rt rib cage area that is more frequent and severe now. Pain level now is 7.5-10. Pt does not want to go to ED. pts urine is very dark and pt has been drinking water. 04/14/19 fever 100.1 but today temp 98.4. S/T but pt thinks due to zenkers; dry cough to prod cough with green phlegm on and off; congestion, h/a and watery diarrhea started on 04/16/19. Dr Darnell Level said he would do virtual with pt today at 12:30. When I went back on phone pt did not think he could wait until 12:30 due to pain worsening and now gone lower in rt upper abd but still above waistline. Pt said he does have gall stones. Pt will go to [facility] ED now. FYI to Dr Darnell Level.

## 2019-04-18 ENCOUNTER — Inpatient Hospital Stay (HOSPITAL_COMMUNITY): Payer: PPO

## 2019-04-18 ENCOUNTER — Encounter (HOSPITAL_COMMUNITY): Payer: Self-pay | Admitting: Physician Assistant

## 2019-04-18 DIAGNOSIS — R7401 Elevation of levels of liver transaminase levels: Secondary | ICD-10-CM

## 2019-04-18 DIAGNOSIS — J449 Chronic obstructive pulmonary disease, unspecified: Secondary | ICD-10-CM

## 2019-04-18 DIAGNOSIS — K8309 Other cholangitis: Secondary | ICD-10-CM

## 2019-04-18 DIAGNOSIS — R1011 Right upper quadrant pain: Secondary | ICD-10-CM

## 2019-04-18 LAB — COMPREHENSIVE METABOLIC PANEL
ALT: 146 U/L — ABNORMAL HIGH (ref 0–44)
AST: 205 U/L — ABNORMAL HIGH (ref 15–41)
Albumin: 3.4 g/dL — ABNORMAL LOW (ref 3.5–5.0)
Alkaline Phosphatase: 243 U/L — ABNORMAL HIGH (ref 38–126)
Anion gap: 14 (ref 5–15)
BUN: 8 mg/dL (ref 8–23)
CO2: 24 mmol/L (ref 22–32)
Calcium: 8.6 mg/dL — ABNORMAL LOW (ref 8.9–10.3)
Chloride: 99 mmol/L (ref 98–111)
Creatinine, Ser: 0.72 mg/dL (ref 0.61–1.24)
GFR calc Af Amer: >60 mL/min (ref >60)
GFR calc non Af Amer: >60 mL/min (ref >60)
Glucose, Bld: 109 mg/dL — ABNORMAL HIGH (ref 70–99)
Potassium: 3.7 mmol/L (ref 3.5–5.1)
Sodium: 137 mmol/L (ref 135–145)
Total Bilirubin: 3.5 mg/dL — ABNORMAL HIGH (ref 0.3–1.2)
Total Protein: 6.3 g/dL — ABNORMAL LOW (ref 6.5–8.1)

## 2019-04-18 LAB — CBC
HCT: 44.2 % (ref 39.0–52.0)
Hemoglobin: 14.8 g/dL (ref 13.0–17.0)
MCH: 32.3 pg (ref 26.0–34.0)
MCHC: 33.5 g/dL (ref 30.0–36.0)
MCV: 96.5 fL (ref 80.0–100.0)
Platelets: 237 10*3/uL (ref 150–400)
RBC: 4.58 MIL/uL (ref 4.22–5.81)
RDW: 13.6 % (ref 11.5–15.5)
WBC: 7.6 10*3/uL (ref 4.0–10.5)
nRBC: 0 % (ref 0.0–0.2)

## 2019-04-18 LAB — BLOOD CULTURE ID PANEL (REFLEXED)

## 2019-04-18 LAB — HEPATITIS PANEL, ACUTE
HCV Ab: NONREACTIVE
Hep A IgM: NONREACTIVE
Hep B C IgM: NONREACTIVE
Hepatitis B Surface Ag: NONREACTIVE

## 2019-04-18 LAB — APTT: aPTT: 31 seconds (ref 24–36)

## 2019-04-18 LAB — HIV ANTIBODY (ROUTINE TESTING W REFLEX): HIV Screen 4th Generation wRfx: NONREACTIVE

## 2019-04-18 LAB — PROTIME-INR
INR: 1.1 (ref 0.8–1.2)
Prothrombin Time: 13.9 seconds (ref 11.4–15.2)

## 2019-04-18 MED ORDER — HYDROMORPHONE HCL 1 MG/ML IJ SOLN
1.0000 mg | INTRAMUSCULAR | Status: DC | PRN
  Administered 2019-04-18 – 2019-04-21 (×9): 1 mg via INTRAVENOUS
  Filled 2019-04-18 (×9): qty 1

## 2019-04-18 MED ORDER — SODIUM CHLORIDE 0.9 % IV SOLN
INTRAVENOUS | Status: AC
  Administered 2019-04-18: 10:00:00 via INTRAVENOUS

## 2019-04-18 MED ORDER — GADOBUTROL 1 MMOL/ML IV SOLN
10.0000 mL | Freq: Once | INTRAVENOUS | Status: AC | PRN
  Administered 2019-04-18: 11:00:00 10 mL via INTRAVENOUS

## 2019-04-18 MED ORDER — ACETAMINOPHEN 325 MG PO TABS
650.0000 mg | ORAL_TABLET | Freq: Four times a day (QID) | ORAL | Status: DC | PRN
  Filled 2019-04-18: qty 2

## 2019-04-18 MED ORDER — NICOTINE 21 MG/24HR TD PT24
21.0000 mg | MEDICATED_PATCH | Freq: Every day | TRANSDERMAL | Status: DC
  Administered 2019-04-18 – 2019-04-21 (×4): 21 mg via TRANSDERMAL
  Filled 2019-04-18 (×4): qty 1

## 2019-04-18 NOTE — ED Notes (Signed)
Patient transported to MRI 

## 2019-04-18 NOTE — ED Notes (Signed)
Patient continues to c/o headache, despite iv pain meds earlier, admitting MD made aware of need for pain meds for headache.

## 2019-04-18 NOTE — ED Triage Notes (Signed)
Out of dept for testing

## 2019-04-18 NOTE — Progress Notes (Signed)
[facility] PROGRESS NOTE    Progress Note  Carl Lee  [id number] DOB: 01-Mar-1956 DOA: 04/17/2019 PCP: [name], MD     Brief Narrative:   Carl Lee is an 63 y.o. male with past medical history significant for essential hypertension, prediabetes COPD bladder cancer coronary artery disease status post CABG and a AAA repair comes into the emergency room for right upper quadrant pain nausea and vomiting since 5 days prior to admission.  He is also been having fever nausea and vomiting and a dark urine leukocytosis was 13 right upper quadrant ultrasound show no acute finding CT scan of the abdomen and pelvis showed possible acute cholecystitis. Started on IV fluids IV Dilaudid and IV Zosyn General surgery has been consulted.  Assessment/Plan:   Acute cholecystitis: CT Scan of the abdomen and pelvis on 04/18/2019 showed calcified granuloma, mildly distended gallbladder with mucosal enhancement no evidence of cholecystitis no biliary dilation. CT scan of the chest done in May 2020 showed cholelithiasis. Gallbladder is sludge but no stone.  Elevated bili AST and ALT bilirubin 5.5 and alkaline phosphatase of 280. Remained afebrile in the ED, with no leukocytosis, started empirically on IV Zosyn reflex ID panel [employer] for enterobactireacea. Surgery was consulted recommended to consult GI for possible MRCP, the previous physician discussed with Dr. Henrene Pastor and the patient will be evaluated by Dr. Lyndel Safe gastroenterology this morning.  He was recommended to the patient be kept n.p.o. on IV fluids and IV antibiotics. SARS-CoV-2 negative. Check an MRCP.  My concern is that he might have had passed a stone.  Gram-negative bacteremia: The likely source is his gallbladder, question acute cholangitis, he is currently on IV Zosyn, has remained afebrile with no leukocytosis. Will need 2 weeks of IV antibiotics.  Essential  Hypertension Continue metoprolol amlodipine blood pressure  seems to be stable.  Hyperlipidemia: Hold statin.  Coronary artery disease status post CABG: Hold aspirin continue statin and metoprolol. Cardiac biomarkers have been negative, EKG showed no signs of ischemia. Twelve-lead EKG showed normal sinus rhythm normal axis nonspecific T wave changes.  COPD: Patient is currently on 2 L of oxygen nasal cannula continuous. Seems to be stable continue duo nebs.  Abdominal aortic aneurysm status post repair Aware.  Sleep apnea: Continue CPAP.  History of bladder cancer Noted.  Hepatic steatosis: Noted.  Holding statins.  DVT prophylaxis: lovenox Family Communication:none Disposition Plan/Barrier to D/C: unable to determine Code Status:     Code Status Orders  (From admission, onward)         Start     Ordered   04/17/19 1756  Full code  Continuous     04/17/19 1758        Code Status History    Date Active Date Inactive Code Status Order ID Comments User Context   02/02/2019 0950 02/02/2019 1454 Full Code [id number]  Belva Crome, MD Inpatient   03/08/2017 1952 03/11/2017 1531 Full Code [id number]  [name], MD Inpatient   02/11/2017 1550 02/11/2017 2249 Full Code [id number]  [name], MD Inpatient   07/10/2015 1712 07/11/2015 1326 Full Code [id number]  [name], MD Inpatient   07/10/2015 1711 07/10/2015 1712 Full Code [id number]  [name], MD Inpatient   02/07/2014 1331 02/11/2014 1206 Full Code [id number]  John Giovanni, PA-C Inpatient   02/01/2014 0829 02/01/2014 1607 Full Code [id number]  [name], MD Inpatient   03/25/2012 1351 03/26/2012 1728 Full Code [id number]  [name],  RN Inpatient   03/24/2012 2135 03/25/2012 1351 Full Code [id number]  Mechele Claude, RN Inpatient   Advance Care Planning Activity        IV Access:    Peripheral IV   Procedures and diagnostic studies:   Ct Abdomen Pelvis W Contrast  Result Date: 04/17/2019 CLINICAL DATA:  63 year old  presenting with acute onset of RIGHT UPPER QUADRANT and RIGHT LOWER QUADRANT abdominal pain and fever. Surgical history includes TURBT and endovascular abdominal aortic aneurysm repair. EXAM: CT ABDOMEN AND PELVIS WITH CONTRAST TECHNIQUE: Multidetector CT imaging of the abdomen and pelvis was performed using the standard protocol following bolus administration of intravenous contrast. CONTRAST:  133mL OMNIPAQUE IOHEXOL 300 MG/ML IV. COMPARISON:  12/26/2017 and earlier. FINDINGS: Lower chest: Visualized lung bases clear. Normal heart size. Hepatobiliary: Geographic areas of hepatic steatosis involving the RIGHT lobe. No hepatic parenchymal masses. Calcified granuloma in the lateral segment LEFT lobe. Mildly distended gallbladder with mucosal enhancement, but no evidence of cholelithiasis. No biliary ductal dilation. Pancreas: Normal in appearance without evidence of mass, ductal dilation, or inflammation. Spleen: Normal in size and appearance. Two small accessory splenules MEDIAL and INFERIOR to the spleen. Adrenals/Urinary Tract: Normal appearing adrenal glands. Benign cyst arising from the LOWER pole the RIGHT kidney. Focal areas of scarring in the UPPER pole RIGHT kidney and mid LEFT kidney. No urinary tract calculi on either side. No hydronephrosis. No solid renal masses. Normal appearing decompressed urinary bladder (allowing for the metallic beam hardening streak artifact from the LEFT hip prosthesis which partially obscures the bladder). Stomach/Bowel: Stomach normal in appearance for the degree of distention. Large wide-mouth diverticulum arising from the junction of the descending and transverse duodenum, containing gas. Remaining small bowel normal in appearance, though several small bowel loops are present LATERAL to the descending colon. Diffuse colonic diverticulosis, extensive in the distal descending and sigmoid colon, without evidence of acute diverticulitis. Normal appendix in the RIGHT UPPER pelvis.  Vascular/Lymphatic: Prior endovascular abdominal aortic aneurysm repair with aorto bi-iliac stent grafts. The grafts are patent. Mural thrombus is present within the aortic portion of the stent graft. Native aorto-iliofemoral atherosclerosis. Normal-appearing portal venous and systemic venous systems. No pathologic lymphadenopathy. Reproductive: Normal-appearing prostate gland and seminal vesicles for patient age. Other: None. Musculoskeletal: LEFT hip arthroplasty with anatomic alignment. Thoracolumbar dextroscoliosis. Osseous demineralization. Degenerative disc disease and spondylosis at L4-5 and L5-S1. Diffuse facet degenerative changes throughout the lumbar spine. No acute findings. IMPRESSION: 1. Borderline findings of possible acute cholecystitis. Consider RIGHT UPPER QUADRANT abdominal ultrasound to confirm or deny this possibility. 2. No acute abnormalities otherwise involving the abdomen or pelvis. 3. Geographic areas of hepatic steatosis involving the RIGHT lobe. No hepatic parenchymal masses. 4. Large wide-mouth diverticulum arising from the junction of the descending and transverse duodenum. 5. Diffuse colonic diverticulosis without evidence of acute diverticulitis. 6. Prior endovascular abdominal aortic aneurysm repair with aorto bi-iliac stent grafts. The grafts are patent, though mural thrombus is present within the aortic portion of the graft. Aortic Atherosclerosis (ICD10-I70.0). Electronically Signed   By: Evangeline Dakin M.D.   On: 04/17/2019 16:27   Dg Chest Portable 1 View  Result Date: 04/17/2019 CLINICAL DATA:  Cough. EXAM: PORTABLE CHEST 1 VIEW COMPARISON:  11/10/2018 FINDINGS: Stable changes from prior CABG surgery. Cardiac silhouette is normal in size and configuration. No mediastinal or hilar masses. Lungs are hyperexpanded. Prominent bronchovascular markings most evident in the lower lungs. Minor linear scarring at the right lateral lung base, stable. Lungs are otherwise clear. No  pleural effusion or pneumothorax. Skeletal structures are demineralized but grossly intact. IMPRESSION: 1. No acute cardiopulmonary disease. 2. Hyperexpanded lungs suggests COPD. Stable changes from prior CABG surgery. Electronically Signed   By: Lajean Manes M.D.   On: 04/17/2019 14:49   US Abdomen Limited Ruq  Result Date: 04/17/2019 CLINICAL DATA:  Right upper quadrant pain for 6 days. EXAM: ULTRASOUND ABDOMEN LIMITED RIGHT UPPER QUADRANT COMPARISON:  CT angiogram of the abdomen and pelvis 12/26/2017. FINDINGS: Gallbladder: There is some sludge within the gallbladder. No gallstones or wall thickening visualized. No sonographic Murphy sign noted by sonographer. Common bile duct: Diameter: 0.5 cm Liver: Echogenicity is increased. No focal lesion. Portal vein is patent on color Doppler imaging with normal direction of blood flow towards the liver. Other: None. IMPRESSION: Gallbladder sludge.  Negative for stones or cholecystitis. Fatty infiltration of the liver. Electronically Signed   By: Inge Rise M.D.   On: 04/17/2019 13:24     Medical Consultants:    None.  Anti-Infectives:   Zosyn  Subjective:    TYCHO VANDEWATER relates he is anorexic his abdominal pain is controlled.  Objective:    Vitals:   04/18/19 0329 04/18/19 0500 04/18/19 0530 04/18/19 0610  BP: 106/72 111/77  131/80  Pulse: 74 80 71 87  Resp: 19 (!) 22  19  Temp:      TempSrc:      SpO2: 99% (!) 86% 98% 94%  Weight:  94.7 kg    Height:  5\' 10"  (1.778 m)     SpO2: 94 % O2 Flow Rate (L/min): 3 L/min   Intake/Output Summary (Last 24 hours) at 04/18/2019 0802 Last data filed at 04/18/2019 0125 Gross per 24 hour  Intake 1100 ml  Output 500 ml  Net 600 ml   Filed Weights   04/18/19 0500  Weight: 94.7 kg    Exam: General exam: In no acute distress. Respiratory system: Good air movement and clear to auscultation. Cardiovascular system: Regular rate and rhythm with positive S1-S2 no JVD. Gastrointestinal  system: Positive bowel sounds soft nontender nondistended Central nervous system: Alert and oriented. No focal neurological deficits. Extremities: No pedal edema. Skin: No rashes, lesions or ulcers Psychiatry: Judgement and insight appear normal. Mood & affect appropriate.   Data Reviewed:    Labs: Basic Metabolic Panel: Recent Labs  Lab 04/17/19 1132 04/18/19 0350  NA 137 137  K 3.9 3.7  CL 100 99  CO2 26 24  GLUCOSE 138* 109*  BUN 15 8  CREATININE 0.89 0.72  CALCIUM 9.2 8.6*   GFR Estimated Creatinine Clearance: 106.4 mL/min (by C-G formula based on SCr of 0.72 mg/dL). Liver Function Tests: Recent Labs  Lab 04/17/19 1132 04/18/19 0350  AST 519* 205*  ALT 223* 146*  ALKPHOS 280* 243*  BILITOT 5.5* 3.5*  PROT 7.4 6.3*  ALBUMIN 3.9 3.4*   Recent Labs  Lab 04/17/19 1132  LIPASE 17   No results for input(s): AMMONIA in the last 168 hours. Coagulation profile Recent Labs  Lab 04/18/19 0350  INR 1.1   COVID-19 Labs  No results for input(s): DDIMER, FERRITIN, LDH, CRP in the last 72 hours.  Lab Results  Component Value Date   SARSCOV2NAA NEGATIVE 04/17/2019   [place] NEGATIVE 01/31/2019    CBC: Recent Labs  Lab 04/17/19 1132 04/18/19 0350  WBC 13.2* 7.6  HGB 17.3* 14.8  HCT 49.8 44.2  MCV 94.0 96.5  PLT 284 237   Cardiac Enzymes: No results for input(s): CKTOTAL,  CKMB, CKMBINDEX, TROPONINI in the last 168 hours. BNP (last 3 results) No results for input(s): PROBNP in the last 8760 hours. CBG: No results for input(s): GLUCAP in the last 168 hours. D-Dimer: No results for input(s): DDIMER in the last 72 hours. Hgb A1c: No results for input(s): HGBA1C in the last 72 hours. Lipid Profile: Recent Labs    04/17/19 1800  CHOL 85  HDL 23*  LDLCALC 49  TRIG 67  CHOLHDL 3.7   Thyroid function studies: No results for input(s): TSH, T4TOTAL, T3FREE, THYROIDAB in the last 72 hours.  Invalid input(s): FREET3 Anemia work up: No results  for input(s): VITAMINB12, FOLATE, FERRITIN, TIBC, IRON, RETICCTPCT in the last 72 hours. Sepsis Labs: Recent Labs  Lab 04/17/19 1132 04/17/19 1233 04/17/19 1434 04/18/19 0350  WBC 13.2*  --   --  7.6  LATICACIDVEN  --  1.4 1.2  --    Microbiology Recent Results (from the past 240 hour(s))  Fungus Culture & Smear     Status: None (Preliminary result)   Collection Time: 04/11/19 12:55 PM   Specimen: Sputum  Result Value Ref Range Status   MICRO NUMBER: [id number]  Preliminary   SPECIMEN QUALITY: Adequate  Preliminary   Source: SPUTUM  Preliminary   STATUS: PRELIMINARY  Preliminary   SMEAR: No fungal elements seen.  Preliminary   CULTURE: Culture in progress  Preliminary  Respiratory or Resp and Sputum Culture     Status: Abnormal   Collection Time: 04/11/19 12:55 PM   Specimen: Sputum  Result Value Ref Range Status   MICRO NUMBER: [id number]  Final   SPECIMEN QUALITY: Adequate  Final   Source SPUTUM  Final   STATUS: FINAL  Final   GRAM STAIN: yeast (A)  Final    Comment: Gram stain indicates that the specimen is representative of the lower respiratory tract. Few White blood cells seen Many Gram positive cocci Few Gram negative bacilli Few Gram positive bacilli Few Yeast present   RESULT: Growth of normal oropharyngeal flora.  Final  AFB Culture & Smear     Status: None (Preliminary result)   Collection Time: 04/11/19  1:00 PM   Specimen: Sputum   SPUTUM  Result Value Ref Range Status   AFB Specimen Processing Concentration  Final   Acid Fast Smear Negative  Final   Acid Fast Culture Comment  Preliminary    Comment: Specimen has been received and testing has been initiated.  Culture, blood (routine x 2)     Status: None (Preliminary result)   Collection Time: 04/17/19 12:42 PM   Specimen: BLOOD  Result Value Ref Range Status   Specimen Description BLOOD RIGHT ANTECUBITAL  Final   Special Requests   Final    BOTTLES DRAWN AEROBIC AND ANAEROBIC Blood Culture results may not be  optimal due to an excessive volume of blood received in culture bottles   Culture  Setup Time   Final    GRAM NEGATIVE RODS ANAEROBIC BOTTLE ONLY CRITICAL VALUE NOTED.  VALUE IS CONSISTENT WITH PREVIOUSLY REPORTED AND CALLED VALUE. Performed at [facility], [place] [address] [place], [place] [address]    Culture PENDING  Incomplete   Report Status PENDING  Incomplete  Culture, blood (routine x 2)     Status: None (Preliminary result)   Collection Time: 04/17/19 12:46 PM   Specimen: BLOOD  Result Value Ref Range Status   Specimen Description BLOOD LEFT ANTECUBITAL  Final   Special Requests   Final  BOTTLES DRAWN AEROBIC AND ANAEROBIC Blood Culture adequate volume   Culture  Setup Time   Final    GRAM NEGATIVE RODS IN BOTH AEROBIC AND ANAEROBIC BOTTLES Organism ID to follow CRITICAL RESULT CALLED TO, READ BACK BY AND VERIFIED WITH: PHRMD L SEAY @0617  04/18/19 BY S GEZAHEGN Performed at [facility], [address], [place], [place] [address]    Culture PENDING  Incomplete   Report Status PENDING  Incomplete  Blood Culture ID Panel (Reflexed)     Status: Abnormal   Collection Time: 04/17/19 12:46 PM  Result Value Ref Range Status   Enterococcus species NOT DETECTED NOT DETECTED Final   Listeria monocytogenes NOT DETECTED NOT DETECTED Final   Staphylococcus species NOT DETECTED NOT DETECTED Final   Staphylococcus aureus (BCID) NOT DETECTED NOT DETECTED Final   Streptococcus species NOT DETECTED NOT DETECTED Final   Streptococcus agalactiae NOT DETECTED NOT DETECTED Final   Streptococcus pneumoniae NOT DETECTED NOT DETECTED Final   Streptococcus pyogenes NOT DETECTED NOT DETECTED Final   Acinetobacter baumannii NOT DETECTED NOT DETECTED Final   Enterobacteriaceae species DETECTED (A) NOT DETECTED Final    Comment: Enterobacteriaceae represent a large family of gram-negative bacteria, not a single organism. CRITICAL RESULT CALLED TO, READ BACK BY AND VERIFIED WITH: PHRMD  L SEAY @0617  04/18/19 BY S GEZAHEGN    Enterobacter cloacae complex NOT DETECTED NOT DETECTED Final   Escherichia coli NOT DETECTED NOT DETECTED Final   Klebsiella oxytoca NOT DETECTED NOT DETECTED Final   Klebsiella pneumoniae DETECTED (A) NOT DETECTED Final    Comment: CRITICAL RESULT CALLED TO, READ BACK BY AND VERIFIED WITH: PHRMD L SEAY @0617  04/18/19 BY S GEZAHEGN    Proteus species NOT DETECTED NOT DETECTED Final   Serratia marcescens NOT DETECTED NOT DETECTED Final   Carbapenem resistance NOT DETECTED NOT DETECTED Final   Haemophilus influenzae NOT DETECTED NOT DETECTED Final   Neisseria meningitidis NOT DETECTED NOT DETECTED Final   Pseudomonas aeruginosa NOT DETECTED NOT DETECTED Final   Candida albicans NOT DETECTED NOT DETECTED Final   Candida glabrata NOT DETECTED NOT DETECTED Final   Candida krusei NOT DETECTED NOT DETECTED Final   Candida parapsilosis NOT DETECTED NOT DETECTED Final   Candida tropicalis NOT DETECTED NOT DETECTED Final    Comment: Performed at [facility], [place] [address] [place], [place] [address]  SARS CORONAVIRUS 2 (TAT 6-24 HRS) Nasopharyngeal Nasopharyngeal Swab     Status: None   Collection Time: 04/17/19  5:01 PM   Specimen: Nasopharyngeal Swab  Result Value Ref Range Status   SARS Coronavirus 2 NEGATIVE NEGATIVE Final    Comment: (NOTE) SARS-CoV-2 target nucleic acids are NOT DETECTED. The SARS-CoV-2 RNA is generally detectable in upper and lower respiratory specimens during the acute phase of infection. Negative results do not preclude SARS-CoV-2 infection, do not rule out co-infections with other pathogens, and should not be used as the sole basis for treatment or other patient management decisions. Negative results must be combined with clinical observations, patient history, and epidemiological information. The expected result is Negative. Fact Sheet for Patients: SugarRoll.be Fact Sheet for Healthcare  Providers: https://www.woods-mathews.com/ This test is not yet approved or cleared by the [place] FDA and  has been authorized for detection and/or diagnosis of SARS-CoV-2 by FDA under an Emergency Use Authorization (EUA). This EUA will remain  in effect (meaning this test can be used) for the duration of the COVID-19 declaration under Section 56 4(b)(1) of the Act,  21 U.S.C. section 360bbb-3(b)(1), unless the authorization is terminated or revoked sooner. Performed at [facility], [place] [address] [place], [place] [address]      Medications:    amLODipine  5 mg Oral Daily   metoprolol tartrate  12.5 mg Oral BID   pantoprazole  40 mg Oral Daily   tamsulosin  0.4 mg Oral QPC supper   Continuous Infusions:  sodium chloride 125 mL/hr at 04/17/19 2004   piperacillin-tazobactam (ZOSYN)  IV Stopped (04/18/19 0747)      LOS: 1 day   Charlynne Cousins  [facility]  04/18/2019, 8:02 AM

## 2019-04-18 NOTE — Telephone Encounter (Addendum)
Noted. Pt admitted to hospital with possible acute cholecystitis

## 2019-04-18 NOTE — ED Notes (Signed)
Lunch tray ordered 

## 2019-04-18 NOTE — Consult Note (Addendum)
[facility] Consult/Admission Note  Carl Lee 1955-12-12  [id number].    Requesting MD: Charlynne Cousins Chief Complaint:  RUQ pain, nausea and vomiting x 5 days Reason for Consult: RUQ pain/possible cholecystitis   HPI: Pt is a 63 y/o male who has multiple medical issues.  He presents with abdominal pain generalized, but worst in the RUQ, nausea and vomiting.  He says pain has made his breathing more difficult.  He reports a low grad fever 100.1 on and off, chills, itching, and dark urine.   He says he has not eaten since 04/13/19.  Eating makes pain worse.  He is tolerating liquids, and really wants something to drink now.    Work up in the ED shows he is afebrile and VSS. BP slightly elevated.  Admission labs show glucose of 138, alk phos 280, lipase of 17, and AST 519, ALT 223, total bilirubin 5.5.  WBC 13.2, hemoglobin 17.3, hematocrit 49.8, platelets are 284,000. Abdominal ultrasound 10/6: Shows some sludge in the gallbladder no gallstones or wall thickening visualized.  Common bile duct was 0.5 cm.  Fatty infiltration of the liver.  CT scan 10/6: Hepatic steatosis,, no masses, calcified granuloma segment left lobe.  Mildly distended gallbladder no evidence of cholelithiasis no biliary ductal dilatation.  Large widemouth diverticulum arising from the junction of the descending and transverse duodenum, diffuse colonic diverticulosis without evidence of acute diverticulitis.  Prior endovascular abdominal aortic aneurysm repair with aorto by iliac stent grafts.  Grafts are patent.there is some mural thrombus present in the aortic portion of the graft. We are asked to see.  You last evening by Dr. Georgette Dover recommended MRCP and GI consult.  He also said the patient would need cardiac clearance prior to any surgical recommendations.  Labs this a.m. shows glucose 109 alk phos 243, AST 205, ALT 146, total bilirubin 3.5.  WBC down to 7.6, hemoglobin 14.8, hematocrit 44.2, platelets  237,000.  Past medical history is significant for hypertension, prediabetes, COPD on oxygen, bladder cancer, coronary disease status post CABG and aortic aneurysm repair, and history of sleep apnea.    ROS: Review of Systems  Constitutional: Positive for chills and fever.  HENT: Positive for hearing loss.   Eyes: Negative.   Respiratory: Positive for cough, shortness of breath (DOE) and wheezing.        He has sleep apnea and is on BiPAP at home.  Cardiovascular:       Chronic pain from chest incision; S/P CABG  Gastrointestinal: Positive for abdominal pain, diarrhea, heartburn, nausea and vomiting. Negative for blood in stool, constipation and melena.       Colonoscopy 08/10/2017 Dr. Ardis Hughs: 4 polyps/diffuse diverticulosis EGD 08/10/2017 Dr. Genevive Bi Diverticulum - Mild gastritis.  Genitourinary: Positive for dysuria.       Very dark urine  Musculoskeletal: Positive for back pain.  Skin:       Reports thin skin and bleeds easily  Neurological: Negative.   Endo/Heme/Allergies: Bruises/bleeds easily.  Psychiatric/Behavioral: Negative.     Family History  Problem Relation Age of Onset  . Diabetes Mother   . Arrhythmia Mother        pacemaker  . COPD Mother   . Thyroid disease Mother   . Hyperlipidemia Mother   . Hypertension Mother   . Bladder Cancer Mother   . Diabetes Father   . CAD Father 13       CHF, MI  . Dementia Father   . Heart attack Father   .  Diabetes Sister   . Heart attack Maternal Grandmother   . AAA (abdominal aortic aneurysm) Maternal Grandmother   . Colon cancer Neg Hx   . Stomach cancer Neg Hx     Past Medical History:  Diagnosis Date  . AAA (abdominal aortic aneurysm) ([place]) 03/25/12   s/p endovascular repair  . Adenomatous polyp 06/2009  . Atrial tachycardia, paroxysmal ([place]) 06/18/2016  . Bladder cancer ([place]) 08/16/2008   recurrence 2015 and 2018 treated with office fulguration  . CAD (coronary artery disease), native coronary artery     S/p 3v CABG 01/2014   . Cataract 2019   bilateral eyes  . Childhood asthma    as a child  . Complication of anesthesia    "takes me a year to get over"  . Contact dermatitis    atypical [name])  . Controlled diabetes mellitus type 2 with complications ([place]) 47/42/5956   CAD   . COPD (chronic obstructive pulmonary disease) ([place])   . Coronary artery disease    a.2015: CABG w/ LIMA-LAD, RIMA-OM, SVG-RCA b. Cath on 07/10/15 w/ patent LIMA-LAD and RIMA-OM. SVG-RCA occluded but collaterals present  . Diverticulosis 2013   severe by CT and colonoscopy  . Environmental allergies    improved as ages  . Fracture of lateral malleolus of left ankle 08/29/2013  . Headache    hasn't had migraines in years  . Hepatic steatosis 06/2015   by [place]  . Hx of migraines   . Hypertension   . Lactose intolerance 11/14/2018   Endorses for years - avoids dairy products  . Lumbosacral radiculopathy at S1 03/2012   left, with spinal stenosis (MRI 04/2013) improved with TF ESI L5/S1 and S1/2 (Dalton Lawtey)  . OSA (obstructive sleep apnea) 09/04/2014   Severe with AHI 35/hr, uses BIPAP  . Overweight (BMI 25.0-29.9) 01/26/2017  . Pre-diabetes   . Prediabetes 06/08/2014   CAD   . Resistance to clopidogrel 2015   drug metabolism panel run - asked to scan  . Spinal stenosis    LS1 nerve root impingement from bulging disc  . Vitamin B12 deficiency   . Zenker diverticulum     Past Surgical History:  Procedure Laterality Date  . ABDOMINAL AORTIC ANEURYSM REPAIR  03/25/12   endovascular  . ABI  05/2013   WNL, L TBI low at 0.66  . Bladder cancer  March 2010 and Oct. 2015   Ernst Spell) x 2  . CARDIAC CATHETERIZATION N/A 07/10/2015   Procedure: Left Heart Cath and Cors/Grafts Angiography;  Surgeon: [name], MD;  Location: [place] [facility];  Service: Cardiovascular;  Laterality: N/A;  . CATARACT EXTRACTION W/ INTRAOCULAR LENS IMPLANT Bilateral 07/2018, 08/2018  . COLONOSCOPY  06/2012    hyperplastic polyp, diverticulosis (jacobs) rec rpt 5 yrs  . COLONOSCOPY WITH PROPOFOL N/A 08/10/2017   TA, rpt 5 yrs Ardis Hughs, Melene Plan, MD)  . CORONARY ARTERY BYPASS GRAFT N/A 02/07/2014   Procedure: CORONARY ARTERY BYPASS GRAFTING (CABG);  Surgeon: [name], MD;  Location: [place];  Service: Open Heart Surgery;  Laterality: N/A;  CABG X 3, BILATERAL LIMA, EVH  . CYSTOSCOPY  08/16/08   Bladder Cancer  . EPIDURAL BLOCK INJECTION Left 09/2014, 10/2014, 12/2014   medial L2,3,4, dorsal L5 ramus blocks x2, L L5/S1 and S1/2 transforaminal ESI (Dalton Salyer)  . ESI  04/2013, 06/2013   L L5S1, S12 transforaminal ESI (Dr.  [name])  . ESI Left 04/2014, 05/2014, 06/2014   L5/S1, S1/2; rpt; L4/5  .  ESI  03/2016   R L5/S1 interlaminar ESI  . ESOPHAGEAL MANOMETRY N/A 08/10/2017   Procedure: ESOPHAGEAL MANOMETRY (EM);  Surgeon: [name], MD;  [facility];  Service: Endoscopy;  Laterality: N/A;  . ESOPHAGOGASTRODUODENOSCOPY (EGD) WITH PROPOFOL N/A 08/10/2017   Procedure: ESOPHAGOGASTRODUODENOSCOPY (EGD) WITH PROPOFOL;  Surgeon: [name], MD;  [facility];  Service: Endoscopy;  Laterality: N/A;  . FULGURATION OF BLADDER TUMOR  01/2017   recurrent 26m L lateral wall papillary transitional cell carcinoma ([place])  . INTRAOPERATIVE TRANSESOPHAGEAL ECHOCARDIOGRAM N/A 02/07/2014   Procedure: INTRAOPERATIVE TRANSESOPHAGEAL ECHOCARDIOGRAM;  Surgeon: SMelrose Nakayama MD;  [place]  Service: Open Heart Surgery;  Laterality: N/A;  . LEFT HEART CATH AND CORS/GRAFTS ANGIOGRAPHY N/A 02/11/2017   Procedure: LEFT HEART CATH AND CORS/GRAFTS ANGIOGRAPHY;  Surgeon: KTroy Sine MD;  [facility];  Service: Cardiovascular;  Laterality: N/A;  . LEFT HEART CATH AND CORS/GRAFTS ANGIOGRAPHY N/A 02/02/2019   Procedure: LEFT HEART CATH AND CORS/GRAFTS ANGIOGRAPHY;  Surgeon: SBelva Crome MD;  [facility];  Service: Cardiovascular;   Laterality: N/A;  . LEFT HEART CATHETERIZATION WITH CORONARY ANGIOGRAM N/A 02/01/2014   Procedure: LEFT HEART CATHETERIZATION WITH CORONARY ANGIOGRAM;  Surgeon: CBurnell Blanks MD;  [facility];  Service: Cardiovascular;  Laterality: N/A;  . PRP epidural injection Left 11/2015   L5/S1, S1/2 transforaminal epidural PRP injections under fluoroscopy (Dalton-Bethea)  . REPLACEMENT TOTAL HIP W/  RESURFACING IMPLANTS Left   . TONSILLECTOMY AND ADENOIDECTOMY  1973  . TOTAL HIP ARTHROPLASTY Left 03/08/2017  . TOTAL HIP ARTHROPLASTY Left 03/08/2017   Procedure: LEFT TOTAL HIP ARTHROPLASTY ANTERIOR APPROACH;  Surgeon: BMcarthur Rossetti MD;  [place]  Service: Orthopedics;  Laterality: Left;  .Marland KitchenVASECTOMY    . ZENKER'S DIVERTICULECTOMY  09/2017    Social History:  reports that he quit smoking about 5 years ago. His smoking use included cigarettes. He has a 40.00 pack-year smoking history. He has never used smokeless tobacco. He reports current alcohol use. He reports that he does not use drugs.  Allergies:  Allergies  Allergen Reactions  . Codeine Other (See Comments)    Nightmares  . Doxycycline Hives and Other (See Comments)    mild    Prior to Admission medications   Medication Sig Start Date End Date Taking? Authorizing Provider  acetaminophen (TYLENOL) 325 MG tablet Take 2 tablets (650 mg total) by mouth every 6 (six) hours as needed for moderate pain or headache. 07/14/18  Yes GRia Bush MD  albuterol ([facility]) 108 ([phone]) MCG/ACT inhaler Inhale 2 puffs into the lungs every 4 (four) hours as needed for wheezing or shortness of breath. 03/21/19  Yes GMagdalen Spatz NP  amLODipine (NORVASC) 5 MG tablet TAKE 1 TABLET BY MOUTH EVERY DAY Patient taking differently: Take 5 mg by mouth daily.  02/08/19  Yes NJosue Hector MD  aspirin EC 81 MG tablet Take 1 tablet (81 mg total) by mouth daily. 02/21/19  Yes LImogene Burn PA-C  atorvastatin (LIPITOR) 20 MG  tablet TAKE 1 TABLET (20 MG TOTAL) BY MOUTH DAILY AT 6 PM. 08/16/18  Yes GRia Bush MD  calcium carbonate (TUMS - DOSED IN MG ELEMENTAL CALCIUM) 500 MG chewable tablet Chew 2 tablets by mouth daily as needed for indigestion or heartburn.   Yes [provider]  Carboxymethylcellul-Glycerin (LUBRICATING EYE DROPS OP) Place 1 drop into both eyes daily as needed (dry eyes).  Yes [provider]  cholecalciferol (VITAMIN D3) 25 MCG (1000 UT) tablet Take 1,000 Units by mouth daily.   Yes [provider]  Glycopyrrolate-Formoterol (BEVESPI AEROSPHERE) 9-4.8 MCG/ACT AERO Inhale 2 puffs into the lungs 2 (two) times daily. 04/11/19  Yes Martyn Ehrich, NP  Guaifenesin [facility] MAXIMUM STRENGTH) 1200 MG TB12 Take 1 tablet (1,200 mg total) by mouth 2 (two) times daily. 04/11/19  Yes Magdalen Spatz, NP  ketotifen (ZADITOR) 0.025 % ophthalmic solution Place 1 drop into both eyes daily as needed (allergies).   Yes [provider]  metoprolol tartrate (LOPRESSOR) 25 MG tablet TAKE 0.5 TABLETS (12.5 MG TOTAL) BY MOUTH 2 (TWO) TIMES DAILY. 04/02/19  Yes Josue Hector, MD  nystatin (MYCOSTATIN) 100000 UNIT/ML suspension Swish and swallow 13m four times daily x5-7days 04/11/19  Yes GMagdalen Spatz NP  oxyCODONE (ROXICODONE) 5 MG immediate release tablet Take 1 tablet (5 mg total) by mouth every 6 (six) hours as needed for severe pain. 02/28/19  Yes BMcarthur Rossetti MD  Respiratory Therapy Supplies (FLUTTER) DEVI Use as directed 04/11/19  Yes GMagdalen Spatz NP  Simethicone (GAS-X PO) Take 1 tablet by mouth daily as needed (gas).   Yes [provider]  tamsulosin (FLOMAX) 0.4 MG CAPS capsule Take 1 capsule (0.4 mg total) by mouth daily after supper. 11/08/18  Yes GRia Bush MD  vitamin B-12 (CYANOCOBALAMIN) 500 MCG tablet Take 1 tablet (500 mcg total) by mouth every other day. Patient taking differently: Take 500 mcg by mouth every other day. Took on Monday  12/29/17  Yes GRia Bush MD  famotidine (PEPCID) 20 MG tablet TAKE 1 TABLET BY MOUTH TWICE A DAY 03/22/19   GRia Bush MD  glucose blood (ONE TOUCH ULTRA TEST) test strip CHECK ONCE DAILY AND AS NEEDED 10/21/14   [provider]  ipratropium-albuterol (DUONEB) 0.5-2.5 (3) MG/3ML SOLN TAKE 3 MLS BY NEBULIZATION EVERY 6 (SIX) HOURS AS NEEDED FOR UP TO 30 DAYS. 01/29/19 02/28/19  NFenton Foy NP  omeprazole (PRILOSEC) 40 MG capsule Take 1 capsule (40 mg total) by mouth daily. Take for 3 weeks daily then as needed Patient not taking: Reported on 04/17/2019 03/22/19   GRia Bush MD     Blood pressure 131/80, pulse 87, temperature 98.7 F (37.1 C), temperature source Oral, resp. rate 19, height 5' 10" (1.778 m), weight 94.7 kg, SpO2 94 %. Physical Exam: Physical Exam Constitutional:      General: He is not in acute distress.    Appearance: He is well-developed and normal weight. He is not ill-appearing, toxic-appearing or diaphoretic.  HENT:     Head: Normocephalic and atraumatic.  Eyes:     General: No scleral icterus. Cardiovascular:     Rate and Rhythm: Normal rate and regular rhythm.     Heart sounds: Normal heart sounds. No murmur.     Comments: Midline sternotomy/CT scars Pulmonary:     Effort: Pulmonary effort is normal. No respiratory distress (On O2).     Breath sounds: No stridor. Wheezing (end expiratory ) present. No rhonchi.  Chest:     Chest wall: No tenderness.  Abdominal:     General: Abdomen is flat. Bowel sounds are normal. There is no distension or abdominal bruit. There are no signs of injury.     Palpations: Abdomen is soft.     Tenderness: There is generalized abdominal tenderness (he has some generalized discomfort with palpation ,but worst is RUQ) and tenderness in the right  upper quadrant.     Hernia: No hernia is present.  Skin:    General: Skin is warm and dry.     Capillary Refill: Capillary refill takes less than 2 seconds.   Neurological:     General: No focal deficit present.     Mental Status: He is alert and oriented to person, place, and time.     Cranial Nerves: No cranial nerve deficit.  Psychiatric:        Mood and Affect: Mood normal. Mood is not anxious or depressed.        Behavior: Behavior normal.     Results for orders placed or performed during the hospital encounter of 04/17/19 (from the past 48 hour(s))  Lipase, blood     Status: None   Collection Time: 04/17/19 11:32 AM  Result Value Ref Range   Lipase 17 11 - 51 U/L    Comment: Performed at [facility], [place] [address] [place], [place] [address]  Comprehensive metabolic panel     Status: Abnormal   Collection Time: 04/17/19 11:32 AM  Result Value Ref Range   Sodium 137 135 - 145 mmol/L   Potassium 3.9 3.5 - 5.1 mmol/L   Chloride 100 98 - 111 mmol/L   CO2 26 22 - 32 mmol/L   Glucose, Bld 138 (H) 70 - 99 mg/dL   BUN 15 8 - 23 mg/dL   Creatinine, Ser 0.89 0.61 - 1.24 mg/dL   Calcium 9.2 8.9 - 10.3 mg/dL   Total Protein 7.4 6.5 - 8.1 g/dL   Albumin 3.9 3.5 - 5.0 g/dL   AST 519 (H) 15 - 41 U/L   ALT 223 (H) 0 - 44 U/L   Alkaline Phosphatase 280 (H) 38 - 126 U/L   Total Bilirubin 5.5 (H) 0.3 - 1.2 mg/dL   GFR calc non Af Amer >60 >60 mL/min   GFR calc Af Amer >60 >60 mL/min   Anion gap 11 5 - 15    Comment: Performed at [facility] [address], [place], [place] [address]  CBC     Status: Abnormal   Collection Time: 04/17/19 11:32 AM  Result Value Ref Range   WBC 13.2 (H) 4.0 - 10.5 K/uL   RBC 5.30 4.22 - 5.81 MIL/uL   Hemoglobin 17.3 (H) 13.0 - 17.0 g/dL   HCT 49.8 39.0 - 52.0 %   MCV 94.0 80.0 - 100.0 fL   MCH 32.6 26.0 - 34.0 pg   MCHC 34.7 30.0 - 36.0 g/dL   RDW 13.3 11.5 - 15.5 %   Platelets 284 150 - 400 K/uL   nRBC 0.0 0.0 - 0.2 %    Comment: Performed at [facility], [place] [address] [place], [place] [address]  Urinalysis, Routine w reflex microscopic     Status: Abnormal   Collection Time:  04/17/19 12:24 PM  Result Value Ref Range   Color, Urine AMBER (A) YELLOW    Comment: BIOCHEMICALS MAY BE AFFECTED BY COLOR   APPearance CLEAR CLEAR   Specific Gravity, Urine 1.020 1.005 - 1.030   pH 5.0 5.0 - 8.0   Glucose, UA NEGATIVE NEGATIVE mg/dL   Hgb urine dipstick NEGATIVE NEGATIVE   Bilirubin Urine MODERATE (A) NEGATIVE   Ketones, ur 5 (A) NEGATIVE mg/dL   Protein, ur NEGATIVE NEGATIVE mg/dL   Nitrite NEGATIVE NEGATIVE   Leukocytes,Ua NEGATIVE NEGATIVE    Comment: Performed at [facility] [address]  [place], [place] [address]  Troponin I (High Sensitivity)     Status: None   Collection Time: 04/17/19 12:33 PM  Result Value Ref Range   Troponin I (High Sensitivity) 7 <18 ng/L    Comment: (NOTE) Elevated high sensitivity troponin I (hsTnI) values and significant  changes across serial measurements may suggest ACS but many other  chronic and acute conditions are known to elevate hsTnI results.  Refer to the "Links" section for chest pain algorithms and additional  guidance. Performed at [facility], [place] [address] [place], [place] [address]   Lactic acid, plasma     Status: None   Collection Time: 04/17/19 12:33 PM  Result Value Ref Range   Lactic Acid, Venous 1.4 0.5 - 1.9 mmol/L    Comment: Performed at [facility] [address] [place], [place] [address]  Culture, blood (routine x 2)     Status: None (Preliminary result)   Collection Time: 04/17/19 12:42 PM   Specimen: BLOOD  Result Value Ref Range   Specimen Description BLOOD RIGHT ANTECUBITAL    Special Requests      BOTTLES DRAWN AEROBIC AND ANAEROBIC Blood Culture results may not be optimal due to an excessive volume of blood received in culture bottles   Culture  Setup Time      GRAM NEGATIVE RODS ANAEROBIC BOTTLE ONLY CRITICAL VALUE NOTED.  VALUE IS CONSISTENT WITH PREVIOUSLY REPORTED AND CALLED VALUE. Performed at [facility], [place] [address], [place], [place]  [address]    Culture PENDING    Report Status PENDING   Culture, blood (routine x 2)     Status: None (Preliminary result)   Collection Time: 04/17/19 12:46 PM   Specimen: BLOOD  Result Value Ref Range   Specimen Description BLOOD LEFT ANTECUBITAL    Special Requests      BOTTLES DRAWN AEROBIC AND ANAEROBIC Blood Culture adequate volume   Culture  Setup Time      GRAM NEGATIVE RODS IN BOTH AEROBIC AND ANAEROBIC BOTTLES Organism ID to follow CRITICAL RESULT CALLED TO, READ BACK BY AND VERIFIED WITH: PHRMD L SEAY _0  04/18/19 BY S GEZAHEGN Performed at [facility], [place] [address], [place], [place] [address]    Culture PENDING    Report Status PENDING   Blood Culture ID Panel (Reflexed)     Status: Abnormal   Collection Time: 04/17/19 12:46 PM  Result Value Ref Range   Enterococcus species NOT DETECTED NOT DETECTED   Listeria monocytogenes NOT DETECTED NOT DETECTED   Staphylococcus species NOT DETECTED NOT DETECTED   Staphylococcus aureus (BCID) NOT DETECTED NOT DETECTED   Streptococcus species NOT DETECTED NOT DETECTED   Streptococcus agalactiae NOT DETECTED NOT DETECTED   Streptococcus pneumoniae NOT DETECTED NOT DETECTED   Streptococcus pyogenes NOT DETECTED NOT DETECTED   Acinetobacter baumannii NOT DETECTED NOT DETECTED   Enterobacteriaceae species DETECTED (A) NOT DETECTED    Comment: Enterobacteriaceae represent a large family of gram-negative bacteria, not a single organism. CRITICAL RESULT CALLED TO, READ BACK BY AND VERIFIED WITH: PHRMD L SEAY _1  04/18/19 BY S GEZAHEGN    Enterobacter cloacae complex NOT DETECTED NOT DETECTED   Escherichia coli NOT DETECTED NOT DETECTED   Klebsiella oxytoca NOT DETECTED NOT DETECTED   Klebsiella pneumoniae DETECTED (A) NOT DETECTED    Comment: CRITICAL RESULT CALLED TO, READ BACK BY AND VERIFIED WITH: PHRMD L SEAY _2  04/18/19 BY S GEZAHEGN    Proteus species NOT DETECTED NOT  DETECTED   Serratia marcescens NOT DETECTED NOT  DETECTED   Carbapenem resistance NOT DETECTED NOT DETECTED   Haemophilus influenzae NOT DETECTED NOT DETECTED   Neisseria meningitidis NOT DETECTED NOT DETECTED   Pseudomonas aeruginosa NOT DETECTED NOT DETECTED   Candida albicans NOT DETECTED NOT DETECTED   Candida glabrata NOT DETECTED NOT DETECTED   Candida krusei NOT DETECTED NOT DETECTED   Candida parapsilosis NOT DETECTED NOT DETECTED   Candida tropicalis NOT DETECTED NOT DETECTED    Comment: Performed at [facility] [address], [place], [place] [address]  Lactic acid, plasma     Status: None   Collection Time: 04/17/19  2:34 PM  Result Value Ref Range   Lactic Acid, Venous 1.2 0.5 - 1.9 mmol/L    Comment: Performed at [facility] [address] [place], [place] [address]  Troponin I (High Sensitivity)     Status: None   Collection Time: 04/17/19  2:34 PM  Result Value Ref Range   Troponin I (High Sensitivity) 8 <18 ng/L    Comment: (NOTE) Elevated high sensitivity troponin I (hsTnI) values and significant  changes across serial measurements may suggest ACS but many other  chronic and acute conditions are known to elevate hsTnI results.  Refer to the "Links" section for chest pain algorithms and additional  guidance. Performed at [facility], [place] [address] [place], [place] [address]   SARS CORONAVIRUS 2 (TAT 6-24 HRS) Nasopharyngeal Nasopharyngeal Swab     Status: None   Collection Time: 04/17/19  5:01 PM   Specimen: Nasopharyngeal Swab  Result Value Ref Range   SARS Coronavirus 2 NEGATIVE NEGATIVE    Comment: (NOTE) SARS-CoV-2 target nucleic acids are NOT DETECTED. The SARS-CoV-2 RNA is generally detectable in upper and lower respiratory specimens during the acute phase of infection. Negative results do not preclude SARS-CoV-2 infection, do not rule out co-infections with other pathogens, and should not be used as the sole basis for treatment or other patient management decisions. Negative  results must be combined with clinical observations, patient history, and epidemiological information. The expected result is Negative. Fact Sheet for Patients: SugarRoll.be Fact Sheet for Healthcare Providers: https://www.woods-mathews.com/ This test is not yet approved or cleared by the [place] FDA and  has been authorized for detection and/or diagnosis of SARS-CoV-2 by FDA under an Emergency Use Authorization (EUA). This EUA will remain  in effect (meaning this test can be used) for the duration of the COVID-19 declaration under Section 56 4(b)(1) of the Act, 21 U.S.C. section 360bbb-3(b)(1), unless the authorization is terminated or revoked sooner. Performed at [facility], [place] [address], [place], [place] [address]   Lipid panel     Status: Abnormal   Collection Time: 04/17/19  6:00 PM  Result Value Ref Range   Cholesterol 85 0 - 200 mg/dL   Triglycerides 67 <150 mg/dL   HDL 23 (L) >40 mg/dL   Total CHOL/HDL Ratio 3.7 RATIO   VLDL 13 0 - 40 mg/dL   LDL Cholesterol 49 0 - 99 mg/dL    Comment:        Total Cholesterol/HDL:CHD Risk Coronary Heart Disease Risk Table                     Men   Women  1/2 Average Risk   3.4   3.3  Average Risk       5.0   4.4  2 X Average  Risk   9.6   7.1  3 X Average Risk  23.4   11.0        Use the calculated Patient Ratio above and the CHD Risk Table to determine the patient's CHD Risk.        ATP III CLASSIFICATION (LDL):  <100     mg/dL   Optimal  100-129  mg/dL   Near or Above                    Optimal  130-159  mg/dL   Borderline  160-189  mg/dL   High  >190     mg/dL   Very High Performed at [facility] [address], [place], [place] [address]   Comprehensive metabolic panel     Status: Abnormal   Collection Time: 04/18/19  3:50 AM  Result Value Ref Range   Sodium 137 135 - 145 mmol/L   Potassium 3.7 3.5 - 5.1 mmol/L   Chloride 99 98 - 111 mmol/L   CO2 24 22 -  32 mmol/L   Glucose, Bld 109 (H) 70 - 99 mg/dL   BUN 8 8 - 23 mg/dL   Creatinine, Ser 0.72 0.61 - 1.24 mg/dL   Calcium 8.6 (L) 8.9 - 10.3 mg/dL   Total Protein 6.3 (L) 6.5 - 8.1 g/dL   Albumin 3.4 (L) 3.5 - 5.0 g/dL   AST 205 (H) 15 - 41 U/L   ALT 146 (H) 0 - 44 U/L   Alkaline Phosphatase 243 (H) 38 - 126 U/L   Total Bilirubin 3.5 (H) 0.3 - 1.2 mg/dL   GFR calc non Af Amer >60 >60 mL/min   GFR calc Af Amer >60 >60 mL/min   Anion gap 14 5 - 15    Comment: Performed at [facility], [place] [address], [place] [address]  CBC     Status: None   Collection Time: 04/18/19  3:50 AM  Result Value Ref Range   WBC 7.6 4.0 - 10.5 K/uL   RBC 4.58 4.22 - 5.81 MIL/uL   Hemoglobin 14.8 13.0 - 17.0 g/dL   HCT 44.2 39.0 - 52.0 %   MCV 96.5 80.0 - 100.0 fL   MCH 32.3 26.0 - 34.0 pg   MCHC 33.5 30.0 - 36.0 g/dL   RDW 13.6 11.5 - 15.5 %   Platelets 237 150 - 400 K/uL   nRBC 0.0 0.0 - 0.2 %    Comment: Performed at [facility], [place] [address] [place], [place] [id number]  Protime-INR     Status: None   Collection Time: 04/18/19  3:50 AM  Result Value Ref Range   Prothrombin Time 13.9 11.4 - 15.2 seconds   INR 1.1 0.8 - 1.2    Comment: (NOTE) INR goal varies based on device and disease states. Performed at [facility], [employer] [address], [place], [place] [address]   APTT     Status: None   Collection Time: 04/18/19  3:50 AM  Result Value Ref Range   aPTT 31 24 - 36 seconds    Comment: Performed at [facility] [address], [place], [place] [address]   Ct Abdomen Pelvis W Contrast  Result Date: 04/17/2019 CLINICAL DATA:  63 year old presenting with acute onset of RIGHT UPPER QUADRANT and RIGHT LOWER QUADRANT abdominal pain and fever. Surgical history includes TURBT and endovascular abdominal aortic aneurysm repair. EXAM: CT ABDOMEN AND PELVIS WITH CONTRAST  TECHNIQUE: Multidetector CT imaging of the abdomen and pelvis was performed using the standard protocol  following bolus administration of intravenous contrast. CONTRAST:  115m OMNIPAQUE IOHEXOL 300 MG/ML IV. COMPARISON:  12/26/2017 and earlier. FINDINGS: Lower chest: Visualized lung bases clear. Normal heart size. Hepatobiliary: Geographic areas of hepatic steatosis involving the RIGHT lobe. No hepatic parenchymal masses. Calcified granuloma in the lateral segment LEFT lobe. Mildly distended gallbladder with mucosal enhancement, but no evidence of cholelithiasis. No biliary ductal dilation. Pancreas: Normal in appearance without evidence of mass, ductal dilation, or inflammation. Spleen: Normal in size and appearance. Two small accessory splenules MEDIAL and INFERIOR to the spleen. Adrenals/Urinary Tract: Normal appearing adrenal glands. Benign cyst arising from the LOWER pole the RIGHT kidney. Focal areas of scarring in the UPPER pole RIGHT kidney and mid LEFT kidney. No urinary tract calculi on either side. No hydronephrosis. No solid renal masses. Normal appearing decompressed urinary bladder (allowing for the metallic beam hardening streak artifact from the LEFT hip prosthesis which partially obscures the bladder). Stomach/Bowel: Stomach normal in appearance for the degree of distention. Large wide-mouth diverticulum arising from the junction of the descending and transverse duodenum, containing gas. Remaining small bowel normal in appearance, though several small bowel loops are present LATERAL to the descending colon. Diffuse colonic diverticulosis, extensive in the distal descending and sigmoid colon, without evidence of acute diverticulitis. Normal appendix in the RIGHT UPPER pelvis. Vascular/Lymphatic: Prior endovascular abdominal aortic aneurysm repair with aorto bi-iliac stent grafts. The grafts are patent. Mural thrombus is present within the aortic portion of the stent graft. Native aorto-iliofemoral atherosclerosis. Normal-appearing portal venous and systemic venous systems. No pathologic  lymphadenopathy. Reproductive: Normal-appearing prostate gland and seminal vesicles for patient age. Other: None. Musculoskeletal: LEFT hip arthroplasty with anatomic alignment. Thoracolumbar dextroscoliosis. Osseous demineralization. Degenerative disc disease and spondylosis at L4-5 and L5-S1. Diffuse facet degenerative changes throughout the lumbar spine. No acute findings. IMPRESSION: 1. Borderline findings of possible acute cholecystitis. Consider RIGHT UPPER QUADRANT abdominal ultrasound to confirm or deny this possibility. 2. No acute abnormalities otherwise involving the abdomen or pelvis. 3. Geographic areas of hepatic steatosis involving the RIGHT lobe. No hepatic parenchymal masses. 4. Large wide-mouth diverticulum arising from the junction of the descending and transverse duodenum. 5. Diffuse colonic diverticulosis without evidence of acute diverticulitis. 6. Prior endovascular abdominal aortic aneurysm repair with aorto bi-iliac stent grafts. The grafts are patent, though mural thrombus is present within the aortic portion of the graft. Aortic Atherosclerosis (ICD10-I70.0). Electronically Signed   By: TEvangeline DakinM.D.   On: 04/17/2019 16:27   Dg Chest Portable 1 View  Result Date: 04/17/2019 CLINICAL DATA:  Cough. EXAM: PORTABLE CHEST 1 VIEW COMPARISON:  11/10/2018 FINDINGS: Stable changes from prior CABG surgery. Cardiac silhouette is normal in size and configuration. No mediastinal or hilar masses. Lungs are hyperexpanded. Prominent bronchovascular markings most evident in the lower lungs. Minor linear scarring at the right lateral lung base, stable. Lungs are otherwise clear. No pleural effusion or pneumothorax. Skeletal structures are demineralized but grossly intact. IMPRESSION: 1. No acute cardiopulmonary disease. 2. Hyperexpanded lungs suggests COPD. Stable changes from prior CABG surgery. Electronically Signed   By: DLajean ManesM.D.   On: 04/17/2019 14:49   UKoreaAbdomen Limited  Ruq  Result Date: 04/17/2019 CLINICAL DATA:  Right upper quadrant pain for 6 days. EXAM: ULTRASOUND ABDOMEN LIMITED RIGHT UPPER QUADRANT COMPARISON:  CT angiogram of the abdomen and pelvis 12/26/2017. FINDINGS: Gallbladder: There is some sludge within the gallbladder. No gallstones  or wall thickening visualized. No sonographic Murphy sign noted by sonographer. Common bile duct: Diameter: 0.5 cm Liver: Echogenicity is increased. No focal lesion. Portal vein is patent on color Doppler imaging with normal direction of blood flow towards the liver. Other: None. IMPRESSION: Gallbladder sludge.  Negative for stones or cholecystitis. Fatty infiltration of the liver. Electronically Signed   By: Inge Rise M.D.   On: 04/17/2019 13:24   . sodium chloride 125 mL/hr at 04/17/19 2004  . piperacillin-tazobactam (ZOSYN)  IV Stopped (04/18/19 0747)   Antibiotics Given (last 72 hours)    Date/Time Action Medication Dose Rate   04/17/19 1251 New Bag/Given   piperacillin-tazobactam (ZOSYN) IVPB 3.375 g 3.375 g 100 mL/hr   04/17/19 2144 New Bag/Given   piperacillin-tazobactam (ZOSYN) IVPB 3.375 g 3.375 g 12.5 mL/hr   04/18/19 0513 New Bag/Given   piperacillin-tazobactam (ZOSYN) IVPB 3.375 g 3.375 g 12.5 mL/hr       Assessment/Plan Hypertension Prediabetes COPD/45-pack-year history tobacco/quit 2015 Hx bladder cancer Hx CAD status post CABG 2015, Hx PAT Hx abdominal aortic aneurysm repair with endograft Sleep apnea on BiPAP at home Remote history of migraines none since 2015 Chronic back pain Zenker's diverticulum Left hip replacement   Abdominal pain, nausea and vomiting x5 days  - Elevated LFTs/fatty infiltration liver  - Ultrasound negative for stones/positive for sludge/negative for cholecystitis/CBD 0.5 cm  -CT scan: Mild distended gallbladder/diffuse colonic diverticulosis without acute diverticulitis/[place] diverticulum at the junction of the descending and transverse duodenum  -Hx  diverticulosis/Zenker's diverticulum   FEN: IV fluids/n.p.o. ID: Zosyn 10/6 >> day 2 DVT: SCDs Follow-up: TBD  Plan: Currently the lab findings are not consistent with acute cholecystitis.  LFTs are improving.  We recommend GI consult and we will follow with you.  He would need cardiac clearance prior to any surgical procedure.   [facility] Pager:  [phone]    04/18/2019 6:58 AM

## 2019-04-18 NOTE — ED Notes (Signed)
Patient requesting to sit up in a chair at bedside, recliner placed at bedside and patient assisted to it.

## 2019-04-18 NOTE — ED Notes (Signed)
Pt in recliner stating his head is hurting pretty bad and he feels nauseated. He reports pain 10/10 and was offered pain medication, he is supposed to go for a study soon. Pt provided an ice pack for his head.   GI is now present at the bedside. [id number]

## 2019-04-18 NOTE — ED Notes (Signed)
ED TO INPATIENT HANDOFF REPORT  ED Nurse Name and Phone #:   S Name/Age/Gender Carl Lee 63 y.o. male Room/Bed: 012C/012C  Code Status   Code Status: Full Code  Home/SNF/Other Home Patient oriented to: self, place, time and situation Is this baseline? Yes   Triage Complete: Triage complete  Chief Complaint epigastric pain and fever   Triage Note Pt here from home for RUQ abdominal pain and tenderness since Thursday. Endorses nausea, vomit x 2, and diarrhea that started today. Hx of CABG and COPD and sts he's in so much pain it's affecting his breathing. Endorses fever of up to 100.1 at home, afebrile in triage.   Out of dept for testing   Allergies Allergies  Allergen Reactions  . Codeine Other (See Comments)    Nightmares  . Doxycycline Hives and Other (See Comments)    mild    Level of Care/Admitting Diagnosis ED Disposition    ED Disposition Condition Comment   Admit  Hospital Area: [facility] [[id number]]  Level of Care: Telemetry Cardiac [103]  Covid Evaluation: Asymptomatic Screening Protocol (No Symptoms)  Diagnosis: Hyperbilirubinemia [id number]  Admitting Physician: Mckinley Jewel [id number]  Attending Physician: Mckinley Jewel [phone]  Estimated length of stay: 3 - 4 days  Certification:: I certify this patient will need inpatient services for at least 2 midnights  PT Class (Do Not Modify): Inpatient [101]  PT Acc Code (Do Not Modify): Private [1]       B Medical/Surgery History Past Medical History:  Diagnosis Date  . AAA (abdominal aortic aneurysm) ([place]) 03/25/12   s/p endovascular repair  . Adenomatous polyp 06/2009  . Atrial tachycardia, paroxysmal ([place]) 06/18/2016  . Bladder cancer ([place]) 08/16/2008   recurrence 2015 and 2018 treated with office fulguration  . CAD (coronary artery disease), native coronary artery    S/p 3v CABG 01/2014   . Cataract 2019   bilateral eyes  . Childhood asthma    as a child  .  Complication of anesthesia    "takes me a year to get over"  . Contact dermatitis    atypical [name])  . Controlled diabetes mellitus type 2 with complications ([place]) [id number]   CAD   . COPD (chronic obstructive pulmonary disease) ([place])   . Coronary artery disease    a.2015: CABG w/ LIMA-LAD, RIMA-OM, SVG-RCA b. Cath on 07/10/15 w/ patent LIMA-LAD and RIMA-OM. SVG-RCA occluded but collaterals present  . Diverticulosis 2013   severe by CT and colonoscopy  . Environmental allergies    improved as ages  . Fracture of lateral malleolus of left ankle 08/29/2013  . Headache    hasn't had migraines in years  . Hepatic steatosis 06/2015   by [place]  . Hx of migraines   . Hypertension   . Lactose intolerance 11/14/2018   Endorses for years - avoids dairy products  . Lumbosacral radiculopathy at S1 03/2012   left, with spinal stenosis (MRI 04/2013) improved with TF ESI L5/S1 and S1/2 (Dalton Shepherd)  . OSA (obstructive sleep apnea) 09/04/2014   Severe with AHI 35/hr, uses BIPAP  . Overweight (BMI 25.0-29.9) 01/26/2017  . Resistance to clopidogrel 2015   drug metabolism panel run - asked to scan  . Spinal stenosis    LS1 nerve root impingement from bulging disc  . Vitamin B12 deficiency   . Zenker diverticulum    Past Surgical History:  Procedure Laterality Date  . ABDOMINAL AORTIC ANEURYSM REPAIR  03/25/12   endovascular  .  ABI  05/2013   WNL, L TBI low at 0.66  . Bladder cancer  March 2010 and Oct. 2015   Ernst Spell) x 2  . CARDIAC CATHETERIZATION N/A 07/10/2015   Procedure: Left Heart Cath and Cors/Grafts Angiography;  Surgeon: [name], MD;  Location: [facility];  Service: Cardiovascular;  Laterality: N/A;  . CATARACT EXTRACTION W/ INTRAOCULAR LENS IMPLANT Bilateral 07/2018, 08/2018  . COLONOSCOPY  06/2012   hyperplastic polyp, diverticulosis (jacobs) rec rpt 5 yrs  . COLONOSCOPY WITH PROPOFOL N/A 08/10/2017   TA, rpt 5 yrs Ardis Hughs, Melene Plan, MD)  . CORONARY  ARTERY BYPASS GRAFT N/A 02/07/2014   Procedure: CORONARY ARTERY BYPASS GRAFTING (CABG);  Surgeon: [name], MD;  Location: [facility];  Service: Open Heart Surgery;  Laterality: N/A;  CABG X 3, BILATERAL LIMA, EVH  . CYSTOSCOPY  08/16/08   Bladder Cancer  . EPIDURAL BLOCK INJECTION Left 09/2014, 10/2014, 12/2014   medial L2,3,4, dorsal L5 ramus blocks x2, L L5/S1 and S1/2 transforaminal ESI (Dalton Muir)  . ESI  04/2013, 06/2013   L L5S1, S12 transforaminal ESI (Dr.  [name])  . ESI Left 04/2014, 05/2014, 06/2014   L5/S1, S1/2; rpt; L4/5  . ESI  03/2016   R L5/S1 interlaminar ESI  . ESOPHAGEAL MANOMETRY N/A 08/10/2017   Procedure: ESOPHAGEAL MANOMETRY (EM);  Surgeon: [name], MD;  [facility];  Service: Endoscopy;  Laterality: N/A;  . ESOPHAGOGASTRODUODENOSCOPY (EGD) WITH PROPOFOL N/A 08/10/2017   Procedure: ESOPHAGOGASTRODUODENOSCOPY (EGD) WITH PROPOFOL;  Surgeon: [name], MD;  [facility];  Service: Endoscopy;  Laterality: N/A;  . FULGURATION OF BLADDER TUMOR  01/2017   recurrent 39mm L lateral wall papillary transitional cell carcinoma ([place])  . INTRAOPERATIVE TRANSESOPHAGEAL ECHOCARDIOGRAM N/A 02/07/2014   Procedure: INTRAOPERATIVE TRANSESOPHAGEAL ECHOCARDIOGRAM;  Surgeon: [name], MD;  Location: [facility];  Service: Open Heart Surgery;  Laterality: N/A;  . LEFT HEART CATH AND CORS/GRAFTS ANGIOGRAPHY N/A 02/11/2017   Procedure: LEFT HEART CATH AND CORS/GRAFTS ANGIOGRAPHY;  Surgeon: [name], MD;  Location: [facility];  Service: Cardiovascular;  Laterality: N/A;  . LEFT HEART CATH AND CORS/GRAFTS ANGIOGRAPHY N/A 02/02/2019   Procedure: LEFT HEART CATH AND CORS/GRAFTS ANGIOGRAPHY;  Surgeon: Belva Crome, MD;  Location: [facility];  Service: Cardiovascular;  Laterality: N/A;  . LEFT HEART CATHETERIZATION WITH CORONARY ANGIOGRAM N/A 02/01/2014   Procedure: LEFT HEART CATHETERIZATION WITH CORONARY ANGIOGRAM;   Surgeon: [name], MD;  Location: [facility];  Service: Cardiovascular;  Laterality: N/A;  . PRP epidural injection Left 11/2015   L5/S1, S1/2 transforaminal epidural PRP injections under fluoroscopy (Dalton-Bethea)  . REPLACEMENT TOTAL HIP W/  RESURFACING IMPLANTS Left   . TONSILLECTOMY AND ADENOIDECTOMY  1973  . TOTAL HIP ARTHROPLASTY Left 03/08/2017  . TOTAL HIP ARTHROPLASTY Left 03/08/2017   Procedure: LEFT TOTAL HIP ARTHROPLASTY ANTERIOR APPROACH;  Surgeon: [name], MD;  Location: [facility];  Service: Orthopedics;  Laterality: Left;  [name] VASECTOMY    . ZENKER'S DIVERTICULECTOMY  09/2017     A IV Location/Drains/Wounds Patient Lines/Drains/Airways Status   Active Line/Drains/Airways    Name:   Placement date:   Placement time:   Site:   Days:   Peripheral IV 04/17/19 Right Antecubital   04/17/19    1244    Antecubital   1   Sheath 02/11/17 Right Arterial;Femoral   02/11/17    1350    Arterial;Femoral   796  Incision 03/25/12 Groin Bilateral   03/25/12    1155     2580   Incision (Closed) 02/07/14 Sternum Mid   02/07/14    1308     1896   Incision (Closed) 02/07/14 Leg Right   02/07/14    1309     1896   Incision (Closed) 03/08/17 Hip Left   03/08/17    1449     771          Intake/Output Last 24 hours  Intake/Output Summary (Last 24 hours) at 04/18/2019 1308 Last data filed at 04/18/2019 0930 Gross per 24 hour  Intake 2779.17 ml  Output 500 ml  Net 2279.17 ml    Labs/Imaging Results for orders placed or performed during the hospital encounter of 04/17/19 (from the past 48 hour(s))  Lipase, blood     Status: None   Collection Time: 04/17/19 11:32 AM  Result Value Ref Range   Lipase 17 11 - 51 U/L    Comment: Performed at [facility], [place] [address] [place], [place] [address]  Comprehensive metabolic panel     Status: Abnormal   Collection Time: 04/17/19 11:32 AM  Result Value Ref Range   Sodium 137 135 - 145 mmol/L   Potassium 3.9 3.5  - 5.1 mmol/L   Chloride 100 98 - 111 mmol/L   CO2 26 22 - 32 mmol/L   Glucose, Bld 138 (H) 70 - 99 mg/dL   BUN 15 8 - 23 mg/dL   Creatinine, Ser 0.89 0.61 - 1.24 mg/dL   Calcium 9.2 8.9 - 10.3 mg/dL   Total Protein 7.4 6.5 - 8.1 g/dL   Albumin 3.9 3.5 - 5.0 g/dL   AST 519 (H) 15 - 41 U/L   ALT 223 (H) 0 - 44 U/L   Alkaline Phosphatase 280 (H) 38 - 126 U/L   Total Bilirubin 5.5 (H) 0.3 - 1.2 mg/dL   GFR calc non Af Amer >60 >60 mL/min   GFR calc Af Amer >60 >60 mL/min   Anion gap 11 5 - 15    Comment: Performed at [facility] [address] [place],  [address]  CBC     Status: Abnormal   Collection Time: 04/17/19 11:32 AM  Result Value Ref Range   WBC 13.2 (H) 4.0 - 10.5 K/uL   RBC 5.30 4.22 - 5.81 MIL/uL   Hemoglobin 17.3 (H) 13.0 - 17.0 g/dL   HCT 49.8 39.0 - 52.0 %   MCV 94.0 80.0 - 100.0 fL   MCH 32.6 26.0 - 34.0 pg   MCHC 34.7 30.0 - 36.0 g/dL   RDW 13.3 11.5 - 15.5 %   Platelets 284 150 - 400 K/uL   nRBC 0.0 0.0 - 0.2 %    Comment: Performed at [facility], [place] [address], [place],  [address]  Urinalysis, Routine w reflex microscopic     Status: Abnormal   Collection Time: 04/17/19 12:24 PM  Result Value Ref Range   Color, Urine AMBER (A) YELLOW    Comment: BIOCHEMICALS MAY BE AFFECTED BY COLOR   APPearance CLEAR CLEAR   Specific Gravity, Urine 1.020 1.005 - 1.030   pH 5.0 5.0 - 8.0   Glucose, UA NEGATIVE NEGATIVE mg/dL   Hgb urine dipstick NEGATIVE NEGATIVE   Bilirubin Urine MODERATE (A) NEGATIVE   Ketones, ur 5 (A) NEGATIVE mg/dL   Protein, ur NEGATIVE NEGATIVE mg/dL   Nitrite NEGATIVE NEGATIVE   Leukocytes,Ua NEGATIVE NEGATIVE  Comment: Performed at [facility], [place] [address], [place], [place] [address]  Troponin I (High Sensitivity)     Status: None   Collection Time: 04/17/19 12:33 PM  Result Value Ref Range   Troponin I (High Sensitivity) 7 <18 ng/L    Comment: (NOTE) Elevated high sensitivity troponin I (hsTnI)  values and significant  changes across serial measurements may suggest ACS but many other  chronic and acute conditions are known to elevate hsTnI results.  Refer to the "Links" section for chest pain algorithms and additional  guidance. Performed at [facility], [place] [address], [place], [place] [address]   Lactic acid, plasma     Status: None   Collection Time: 04/17/19 12:33 PM  Result Value Ref Range   Lactic Acid, Venous 1.4 0.5 - 1.9 mmol/L    Comment: Performed at [facility] [address] [place], [place] [address]  Culture, blood (routine x 2)     Status: None (Preliminary result)   Collection Time: 04/17/19 12:42 PM   Specimen: BLOOD  Result Value Ref Range   Specimen Description BLOOD RIGHT ANTECUBITAL    Special Requests      BOTTLES DRAWN AEROBIC AND ANAEROBIC Blood Culture results may not be optimal due to an excessive volume of blood received in culture bottles   Culture  Setup Time      GRAM NEGATIVE RODS ANAEROBIC BOTTLE ONLY CRITICAL VALUE NOTED.  VALUE IS CONSISTENT WITH PREVIOUSLY REPORTED AND CALLED VALUE.    Culture      NO GROWTH < 24 HOURS Performed at [facility], [place] [address], [place], [place] [address]    Report Status PENDING   Culture, blood (routine x 2)     Status: None (Preliminary result)   Collection Time: 04/17/19 12:46 PM   Specimen: BLOOD  Result Value Ref Range   Specimen Description BLOOD LEFT ANTECUBITAL    Special Requests      BOTTLES DRAWN AEROBIC AND ANAEROBIC Blood Culture adequate volume   Culture  Setup Time      GRAM NEGATIVE RODS IN BOTH AEROBIC AND ANAEROBIC BOTTLES Organism ID to follow CRITICAL RESULT CALLED TO, READ BACK BY AND VERIFIED WITH: PHRMD L SEAY @0617  04/18/19 BY S GEZAHEGN    Culture      NO GROWTH < 24 HOURS Performed at [facility], [place] [address], [place], [place] [address]    Report Status PENDING   Blood Culture ID Panel (Reflexed)     Status: Abnormal   Collection  Time: 04/17/19 12:46 PM  Result Value Ref Range   Enterococcus species NOT DETECTED NOT DETECTED   Listeria monocytogenes NOT DETECTED NOT DETECTED   Staphylococcus species NOT DETECTED NOT DETECTED   Staphylococcus aureus (BCID) NOT DETECTED NOT DETECTED   Streptococcus species NOT DETECTED NOT DETECTED   Streptococcus agalactiae NOT DETECTED NOT DETECTED   Streptococcus pneumoniae NOT DETECTED NOT DETECTED   Streptococcus pyogenes NOT DETECTED NOT DETECTED   Acinetobacter baumannii NOT DETECTED NOT DETECTED   Enterobacteriaceae species DETECTED (A) NOT DETECTED    Comment: Enterobacteriaceae represent a large family of gram-negative bacteria, not a single organism. CRITICAL RESULT CALLED TO, READ BACK BY AND VERIFIED WITH: PHRMD L SEAY @0617  04/18/19 BY S GEZAHEGN    Enterobacter cloacae complex NOT DETECTED NOT DETECTED   Escherichia coli NOT DETECTED NOT DETECTED   Klebsiella oxytoca NOT DETECTED NOT DETECTED   Klebsiella pneumoniae DETECTED (A) NOT DETECTED  Comment: CRITICAL RESULT CALLED TO, READ BACK BY AND VERIFIED WITH: PHRMD L SEAY @0617  04/18/19 BY S GEZAHEGN    Proteus species NOT DETECTED NOT DETECTED   Serratia marcescens NOT DETECTED NOT DETECTED   Carbapenem resistance NOT DETECTED NOT DETECTED   Haemophilus influenzae NOT DETECTED NOT DETECTED   Neisseria meningitidis NOT DETECTED NOT DETECTED   Pseudomonas aeruginosa NOT DETECTED NOT DETECTED   Candida albicans NOT DETECTED NOT DETECTED   Candida glabrata NOT DETECTED NOT DETECTED   Candida krusei NOT DETECTED NOT DETECTED   Candida parapsilosis NOT DETECTED NOT DETECTED   Candida tropicalis NOT DETECTED NOT DETECTED    Comment: Performed at [facility] [address] [place], [place] [address]  Lactic acid, plasma     Status: None   Collection Time: 04/17/19  2:34 PM  Result Value Ref Range   Lactic Acid, Venous 1.2 0.5 - 1.9 mmol/L    Comment: Performed at [facility] [address],  [place], [place] [address]  Troponin I (High Sensitivity)     Status: None   Collection Time: 04/17/19  2:34 PM  Result Value Ref Range   Troponin I (High Sensitivity) 8 <18 ng/L    Comment: (NOTE) Elevated high sensitivity troponin I (hsTnI) values and significant  changes across serial measurements may suggest ACS but many other  chronic and acute conditions are known to elevate hsTnI results.  Refer to the "Links" section for chest pain algorithms and additional  guidance. Performed at [facility], [place] [address], [place], [place] [address]   SARS CORONAVIRUS 2 (TAT 6-24 HRS) Nasopharyngeal Nasopharyngeal Swab     Status: None   Collection Time: 04/17/19  5:01 PM   Specimen: Nasopharyngeal Swab  Result Value Ref Range   SARS Coronavirus 2 NEGATIVE NEGATIVE    Comment: (NOTE) SARS-CoV-2 target nucleic acids are NOT DETECTED. The SARS-CoV-2 RNA is generally detectable in upper and lower respiratory specimens during the acute phase of infection. Negative results do not preclude SARS-CoV-2 infection, do not rule out co-infections with other pathogens, and should not be used as the sole basis for treatment or other patient management decisions. Negative results must be combined with clinical observations, patient history, and epidemiological information. The expected result is Negative. Fact Sheet for Patients: SugarRoll.be Fact Sheet for Healthcare Providers: https://www.woods-mathews.com/ This test is not yet approved or cleared by the [place] FDA and  has been authorized for detection and/or diagnosis of SARS-CoV-2 by FDA under an Emergency Use Authorization (EUA). This EUA will remain  in effect (meaning this test can be used) for the duration of the COVID-19 declaration under Section 56 4(b)(1) of the Act, 21 U.S.C. section 360bbb-3(b)(1), unless the authorization is terminated or revoked sooner. Performed at [facility], [place] [address] [place], [place] [address]   Lipid panel     Status: Abnormal   Collection Time: 04/17/19  6:00 PM  Result Value Ref Range   Cholesterol 85 0 - 200 mg/dL   Triglycerides 67 <150 mg/dL   HDL 23 (L) >40 mg/dL   Total CHOL/HDL Ratio 3.7 RATIO   VLDL 13 0 - 40 mg/dL   LDL Cholesterol 49 0 - 99 mg/dL    Comment:        Total Cholesterol/HDL:CHD Risk Coronary Heart Disease Risk Table                     Men   Women  1/2 Average Risk   3.4   3.3  Average Risk       5.0   4.4  2 X Average Risk   9.6   7.1  3 X Average Risk  23.4   11.0        Use the calculated Patient Ratio above and the CHD Risk Table to determine the patient's CHD Risk.        ATP III CLASSIFICATION (LDL):  <100     mg/dL   Optimal  100-129  mg/dL   Near or Above                    Optimal  130-159  mg/dL   Borderline  160-189  mg/dL   High  >190     mg/dL   Very High Performed at [facility] [address], [place], [place] [address]   HIV Antibody (routine testing w rflx)     Status: None   Collection Time: 04/18/19  3:50 AM  Result Value Ref Range   HIV Screen 4th Generation wRfx NON REACTIVE NON REACTIVE    Comment: Performed at [facility] [address], [place], [place] [address]  Comprehensive metabolic panel     Status: Abnormal   Collection Time: 04/18/19  3:50 AM  Result Value Ref Range   Sodium 137 135 - 145 mmol/L   Potassium 3.7 3.5 - 5.1 mmol/L   Chloride 99 98 - 111 mmol/L   CO2 24 22 - 32 mmol/L   Glucose, Bld 109 (H) 70 - 99 mg/dL   BUN 8 8 - 23 mg/dL   Creatinine, Ser 0.72 0.61 - 1.24 mg/dL   Calcium 8.6 (L) 8.9 - 10.3 mg/dL   Total Protein 6.3 (L) 6.5 - 8.1 g/dL   Albumin 3.4 (L) 3.5 - 5.0 g/dL   AST 205 (H) 15 - 41 U/L   ALT 146 (H) 0 - 44 U/L   Alkaline Phosphatase 243 (H) 38 - 126 U/L   Total Bilirubin 3.5 (H) 0.3 - 1.2 mg/dL   GFR calc non Af Amer >60 >60 mL/min   GFR calc Af Amer >60 >60 mL/min   Anion gap 14 5 - 15    Comment:  Performed at [name], [place] [address] [place] [address]  CBC     Status: None   Collection Time: 04/18/19  3:50 AM  Result Value Ref Range   WBC 7.6 4.0 - 10.5 K/uL   RBC 4.58 4.22 - 5.81 MIL/uL   Hemoglobin 14.8 13.0 - 17.0 g/dL   HCT 44.2 39.0 - 52.0 %   MCV 96.5 80.0 - 100.0 fL   MCH 32.3 26.0 - 34.0 pg   MCHC 33.5 30.0 - 36.0 g/dL   RDW 13.6 11.5 - 15.5 %   Platelets 237 150 - 400 K/uL   nRBC 0.0 0.0 - 0.2 %    Comment: Performed at [facility], [place] [address] [place], [place] [address]  Protime-INR     Status: None   Collection Time: 04/18/19  3:50 AM  Result Value Ref Range   Prothrombin Time 13.9 11.4 - 15.2 seconds   INR 1.1 0.8 - 1.2    Comment: (NOTE) INR goal varies based on device and disease states. Performed at [facility], [place] [address] [place], [place] [address]   APTT     Status: None   Collection Time: 04/18/19  3:50 AM  Result Value Ref Range   aPTT 31 24 - 36 seconds    Comment: Performed at [facility] [address] [place], [place] [address]  Hepatitis panel, acute     Status: None   Collection Time: 04/18/19  3:50 AM  Result Value Ref Range   Hepatitis B Surface Ag NON REACTIVE NON REACTIVE   HCV Ab NON REACTIVE NON REACTIVE    Comment: (NOTE) Nonreactive HCV antibody screen is consistent with no HCV infections,  unless recent infection is suspected or other evidence exists to indicate HCV infection.    Hep A IgM NON REACTIVE NON REACTIVE   Hep B C IgM NON REACTIVE NON REACTIVE    Comment: Performed at [facility], [place] [address] [place], [place] [address]   Ct Abdomen Pelvis W Contrast  Result Date: 04/17/2019 CLINICAL DATA:  63 year old presenting with acute onset of RIGHT UPPER QUADRANT and RIGHT LOWER QUADRANT abdominal pain and fever. Surgical history includes TURBT and endovascular abdominal aortic aneurysm repair. EXAM: CT ABDOMEN AND PELVIS WITH CONTRAST TECHNIQUE: Multidetector CT  imaging of the abdomen and pelvis was performed using the standard protocol following bolus administration of intravenous contrast. CONTRAST:  166mL OMNIPAQUE IOHEXOL 300 MG/ML IV. COMPARISON:  12/26/2017 and earlier. FINDINGS: Lower chest: Visualized lung bases clear. Normal heart size. Hepatobiliary: Geographic areas of hepatic steatosis involving the RIGHT lobe. No hepatic parenchymal masses. Calcified granuloma in the lateral segment LEFT lobe. Mildly distended gallbladder with mucosal enhancement, but no evidence of cholelithiasis. No biliary ductal dilation. Pancreas: Normal in appearance without evidence of mass, ductal dilation, or inflammation. Spleen: Normal in size and appearance. Two small accessory splenules MEDIAL and INFERIOR to the spleen. Adrenals/Urinary Tract: Normal appearing adrenal glands. Benign cyst arising from the LOWER pole the RIGHT kidney. Focal areas of scarring in the UPPER pole RIGHT kidney and mid LEFT kidney. No urinary tract calculi on either side. No hydronephrosis. No solid renal masses. Normal appearing decompressed urinary bladder (allowing for the metallic beam hardening streak artifact from the LEFT hip prosthesis which partially obscures the bladder). Stomach/Bowel: Stomach normal in appearance for the degree of distention. Large wide-mouth diverticulum arising from the junction of the descending and transverse duodenum, containing gas. Remaining small bowel normal in appearance, though several small bowel loops are present LATERAL to the descending colon. Diffuse colonic diverticulosis, extensive in the distal descending and sigmoid colon, without evidence of acute diverticulitis. Normal appendix in the RIGHT UPPER pelvis. Vascular/Lymphatic: Prior endovascular abdominal aortic aneurysm repair with aorto bi-iliac stent grafts. The grafts are patent. Mural thrombus is present within the aortic portion of the stent graft. Native aorto-iliofemoral atherosclerosis.  Normal-appearing portal venous and systemic venous systems. No pathologic lymphadenopathy. Reproductive: Normal-appearing prostate gland and seminal vesicles for patient age. Other: None. Musculoskeletal: LEFT hip arthroplasty with anatomic alignment. Thoracolumbar dextroscoliosis. Osseous demineralization. Degenerative disc disease and spondylosis at L4-5 and L5-S1. Diffuse facet degenerative changes throughout the lumbar spine. No acute findings. IMPRESSION: 1. Borderline findings of possible acute cholecystitis. Consider RIGHT UPPER QUADRANT abdominal ultrasound to confirm or deny this possibility. 2. No acute abnormalities otherwise involving the abdomen or pelvis. 3. Geographic areas of hepatic steatosis involving the RIGHT lobe. No hepatic parenchymal masses. 4. Large wide-mouth diverticulum arising from the junction of the descending and transverse duodenum. 5. Diffuse colonic diverticulosis without evidence of acute diverticulitis. 6. Prior endovascular abdominal aortic aneurysm repair with aorto bi-iliac stent grafts. The grafts are patent, though mural thrombus  is present within the aortic portion of the graft. Aortic Atherosclerosis (ICD10-I70.0). Electronically Signed   By: Evangeline Dakin M.D.   On: 04/17/2019 16:27   Dg Chest Portable 1 View  Result Date: 04/17/2019 CLINICAL DATA:  Cough. EXAM: PORTABLE CHEST 1 VIEW COMPARISON:  11/10/2018 FINDINGS: Stable changes from prior CABG surgery. Cardiac silhouette is normal in size and configuration. No mediastinal or hilar masses. Lungs are hyperexpanded. Prominent bronchovascular markings most evident in the lower lungs. Minor linear scarring at the right lateral lung base, stable. Lungs are otherwise clear. No pleural effusion or pneumothorax. Skeletal structures are demineralized but grossly intact. IMPRESSION: 1. No acute cardiopulmonary disease. 2. Hyperexpanded lungs suggests COPD. Stable changes from prior CABG surgery. Electronically Signed    By: Lajean Manes M.D.   On: 04/17/2019 14:49   Mr Abdomen Mrcp Moise Boring Contast  Result Date: 04/18/2019 CLINICAL DATA:  Nausea and vomiting over 5 days. Fever. Leukocytosis. EXAM: MRI ABDOMEN WITHOUT AND WITH CONTRAST (INCLUDING MRCP) TECHNIQUE: Multiplanar multisequence MR imaging of the abdomen was performed both before and after the administration of intravenous contrast. Heavily T2-weighted images of the biliary and pancreatic ducts were obtained, and three-dimensional MRCP images were rendered by post processing. CONTRAST:  23mL GADAVIST GADOBUTROL 1 MMOL/ML IV SOLN COMPARISON:  Serve multiple CT scan from 04/17/2019 FINDINGS: Extensive artifact obscuring the abdominal aorta and surrounding regions, attributed to the patient's aorta bi-iliac stent grafts. Despite efforts by the technologist and patient, motion artifact is present on today's exam and could not be eliminated. This reduces exam sensitivity and specificity. Lower chest: Unremarkable Hepatobiliary: Large parts of the liver and biliary tree are obscured by artifact related to the patient's aorta bi-iliac stent graft. No compelling abnormal enhancement along the margins of the intrahepatic biliary tree. The gallbladder appears unremarkable. The extrahepatic biliary tree is obscured by artifact on most sequences, but does not appear dilated on the axial T2 sequences. No obvious focal hepatic lesion is observed. Pancreas:  Unremarkable where visible. Spleen:  Unremarkable Adrenals/Urinary Tract: Right kidney lower pole cyst is partially included. Much of the kidneys is obscured by metal artifact. Stomach/Bowel: Large duodenal diverticulum extending from the periampullary region through the proximal transverse diverticulum. This could actually represent a cluster of 2 or more diverticula. No compelling evidence of inflammation, much of this process is obscured by metal artifact. Descending and sigmoid colon diverticulosis. 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 Other:  No supplemental non-categorized findings. Musculoskeletal: Mild dextroconvex lumbar rotary scoliosis. IMPRESSION: 1. Unfortunately much of the abdomen is obscured by the metal artifact from the patient's aorta bi-iliac stent graft. I do not see definite abnormal biliary dilatation nor are there findings of beading or irregularity along the bowel ducts to further suggest cholangitis, but sensitivity must be understood to be markedly reduced. Motion artifact plates are rule in obscuring findings as well. 2. No current gallbladder wall thickening is observed. 3. Large duodenal diverticulum or cluster of diverticular extending from the periampullary region to the distal transverse duodenum. 4. Descending and sigmoid colon diverticulosis. Electronically Signed   By: Van Clines M.D.   On: 04/18/2019 12:06   US Abdomen Limited Ruq  Result Date: 04/17/2019 CLINICAL DATA:  Right upper quadrant pain for 6 days. EXAM: ULTRASOUND ABDOMEN LIMITED RIGHT UPPER QUADRANT COMPARISON:  CT angiogram of the abdomen and pelvis 12/26/2017. FINDINGS: Gallbladder: There is some sludge within the gallbladder. No gallstones or wall thickening visualized. No sonographic Murphy sign noted by sonographer. Common bile  duct: Diameter: 0.5 cm Liver: Echogenicity is increased. No focal lesion. Portal vein is patent on color Doppler imaging with normal direction of blood flow towards the liver. Other: None. IMPRESSION: Gallbladder sludge.  Negative for stones or cholecystitis. Fatty infiltration of the liver. Electronically Signed   By: Inge Rise M.D.   On: 04/17/2019 13:24    Pending Labs Unresulted Labs (From admission, onward)    Start     Ordered   04/17/19 1754  HIV4GL Save Tube  (HIV Antibody (Routine testing w reflex) panel)  Once,   STAT     04/17/19 1758          Vitals/Pain Today's Vitals   04/18/19 0617 04/18/19 0930 04/18/19 1000 04/18/19 1229  BP:  (!) 133/113 (!)  135/98 (!) 146/89  Pulse:  80 80 91  Resp:  17 19   Temp:      TempSrc:      SpO2:  94% 95% 98%  Weight:      Height:      PainSc: Asleep       Isolation Precautions No active isolations  Medications Medications  ondansetron (ZOFRAN) tablet 4 mg ( Oral See Alternative 04/18/19 1238)    Or  ondansetron (ZOFRAN) injection 4 mg (4 mg Intravenous Given 04/18/19 1238)  amLODipine (NORVASC) tablet 5 mg (5 mg Oral Given 04/18/19 1238)  metoprolol tartrate (LOPRESSOR) tablet 12.5 mg (12.5 mg Oral Given 04/18/19 1238)  pantoprazole (PROTONIX) EC tablet 40 mg (0 mg Oral Hold 04/17/19 2137)  tamsulosin (FLOMAX) capsule 0.4 mg (0.4 mg Oral Given 04/17/19 2143)  ipratropium-albuterol (DUONEB) 0.5-2.5 (3) MG/3ML nebulizer solution 3 mL (3 mLs Nebulization Given 04/18/19 0521)  piperacillin-tazobactam (ZOSYN) IVPB 3.375 g (0 g Intravenous Stopped 04/18/19 0747)  0.9 %  sodium chloride infusion ( Intravenous New Bag/Given 04/18/19 0934)  HYDROmorphone (DILAUDID) injection 1 mg (1 mg Intravenous Given 04/18/19 1239)  HYDROmorphone (DILAUDID) injection 1 mg (1 mg Intravenous Given 04/17/19 1249)  piperacillin-tazobactam (ZOSYN) IVPB 3.375 g (0 g Intravenous Stopped 04/17/19 1331)  sodium chloride 0.9 % bolus 1,000 mL (0 mLs Intravenous Stopped 04/17/19 1653)  iohexol (OMNIPAQUE) 300 MG/ML solution 100 mL (100 mLs Intravenous Contrast Given 04/17/19 1523)  HYDROmorphone (DILAUDID) injection 1 mg (1 mg Intravenous Given 04/17/19 1607)  ondansetron (ZOFRAN) injection 4 mg (4 mg Intravenous Given 04/17/19 1607)  gadobutrol (GADAVIST) 1 MMOL/ML injection 10 mL (10 mLs Intravenous Contrast Given 04/18/19 1120)    Mobility walks Low fall risk   Focused Assessments Cardiac Assessment Handoff:  Cardiac Rhythm: Normal sinus rhythm No results found for: CKTOTAL, CKMB, CKMBINDEX, TROPONINI Lab Results  Component Value Date   DDIMER 2.03 (H) 11/10/2018   Does the Patient currently have chest pain? No      R Recommendations: See Admitting Provider Note  Report given to:   Additional Notes:

## 2019-04-18 NOTE — Consult Note (Signed)
[facility] Consult: 8:43 AM 04/18/2019  LOS: 1 day    Referring Provider: Dr Aileen Fass  Primary Care Physician:  [name], MD Primary Gastroenterologist:  Dr. Ardis Hughs.        Reason for Consultation:  RUQ abdominal pain, nausea, vomiting, fever.   HPI: Carl Lee is a 63 y.o. male.  Hx Peripheral vascular disease, s/p 2013 endovascular AAA repair with aorto by iliac stent grafts.  COPD.  Bladder cancer 2010 recurrent in 2015, 2018 treated with fulguration.  CAD, s/p CABG.  Fatty liver.  Spinal stenosis.  07/2017 colonoscopy.  For polyp surveillance.  Adenomatous polyps 06/2012, 06/2009. 4 polyps resected and retrieved.  Pandiverticulosis. 07/2017 EGD.  For placement of manometry probe given known Zenker's diverticulum.  In addition to the Zenker's diverticulum, mild gastritis noted. 07/2017 esophageal manometry.  Normal relaxation of the EG junction.  No significant esophageal peristaltic abnormality.   09/2017 Zenker's cricopharyngeal myotomy and preventative endoscopic clips to prevent bleeding.  Also noted was medium sized hiatal hernia and possible short segment Barrett's but no biopsy obtained.   Patient's dysphagia and odynophagia/neck pain, coughing with p.o. returned within a few months of the cricopharyngeal myotomy.  Had pneumonia, possibly aspiration related, in spring 2020. 12/2018 double contrast esophagram:   Relatively large diverticulum posterior to cervical esophagus consistent with Zenker's diverticulum. Pt scheduled for repeat Zenker's repair with Dr. Harl Bowie at [facility] on 04/2014.    Beginning last Thursday, pt developed right upper quadrant pain and belching. 8-10 intensity.  It persisted at various levels of severity over the next several days until he presented to the ED yesterday  morning.  Along the way he had fevers up to 100.1.  Diarrhea, pain in his lower abdomen, pain in his back behind his right upper quadrant pain though he denies that this is "radiating" pain.  Pain is worse after eating so he has not been eating or drinking much.  Feels more short of breath than baseline.  No productive cough.  Nonbloody emesis once yesterday.  Urine is very dark.  Wife notes he looks yellow.  He had 3 or 4 hours of "hives" last week, which he has had in the past.   Hydrocodone helps the pain.  T bili 5.5  >> 3.5.  Alkaline phosphatase 519  >> 243.  AST/ALT 519/223  >> 205/146. Lipase 17 CBC entirely normal as are electrolytes, troponin I. COVID 19 negative. Hepatitis B surface Ag negative.   Hepatitis A IgM, hepatitis B core IgM, HCV Ab pending. Abdominal ultrasound: GB sludge.  Fatty liver.  CBD 5 mm. CTAP with contrast shows areas of hepatic steatosis in the right lobe, calcified granuloma in the left liver lobe.  Gallbladder mildly distended with enhanced mucosa but no cholelithiasis, borderline findings for acute cholecystitis..  No biliary ductal dilatation.  Pancreas and pancreatic duct normal.  2 small splenules.  Diverticula at the junction of descending/transverse duodenum containing gas.  Cyst and scarring in the kidneys.  Fuhs colonic diverticulosis.  Patent aortobiiliac stent grafts with mural thrombus in the aortic  portion of the graft.  MRCP ordered.  General surgery PA has seen the patient, is not convinced this is acute cholecystitis due to improving LFTs.  Recommend GI consult.  Note mentions patient will need cardiac clearance for any surgical procedure.  Patient is disabled, retired from his business in Air traffic controller.  Lives with his wife in [place].  Quit smoking around 5 years ago.  2 alcohol drinks/month.    Past Medical History:  Diagnosis Date   AAA (abdominal aortic aneurysm) ([place]) 03/25/12   s/p endovascular repair   Adenomatous polyp 06/2009    Atrial tachycardia, paroxysmal ([place]) 06/18/2016   Bladder cancer ([place]) 08/16/2008   recurrence 2015 and 2018 treated with office fulguration   CAD (coronary artery disease), native coronary artery    S/p 3v CABG 01/2014    Cataract 2019   bilateral eyes   Childhood asthma    as a child   Complication of anesthesia    "takes me a year to get over"   Contact dermatitis    atypical [name])   Controlled diabetes mellitus type 2 with complications ([place]) [id number]   CAD    COPD (chronic obstructive pulmonary disease) ([place])    Coronary artery disease    a.2015: CABG w/ LIMA-LAD, RIMA-OM, SVG-RCA b. Cath on 07/10/15 w/ patent LIMA-LAD and RIMA-OM. SVG-RCA occluded but collaterals present   Diverticulosis 2013   severe by CT and colonoscopy   Environmental allergies    improved as ages   Fracture of lateral malleolus of left ankle 08/29/2013   Headache    hasn't had migraines in years   Hepatic steatosis 06/2015   by [place]   Hx of migraines    Hypertension    Lactose intolerance 11/14/2018   Endorses for years - avoids dairy products   Lumbosacral radiculopathy at S1 03/2012   left, with spinal stenosis (MRI 04/2013) improved with TF ESI L5/S1 and S1/2 ([place])   OSA (obstructive sleep apnea) 09/04/2014   Severe with AHI 35/hr, uses BIPAP   Overweight (BMI 25.0-29.9) 01/26/2017   Pre-diabetes    Prediabetes 06/08/2014   CAD    Resistance to clopidogrel 2015   drug metabolism panel run - asked to scan   Spinal stenosis    LS1 nerve root impingement from bulging disc   Vitamin B12 deficiency    Zenker diverticulum     Past Surgical History:  Procedure Laterality Date   ABDOMINAL AORTIC ANEURYSM REPAIR  03/25/12   endovascular   ABI  05/2013   WNL, L TBI low at 0.66   Bladder cancer  March 2010 and Oct. 2015   [place]nst Spell) x 2   CARDIAC CATHETERIZATION N/A 07/10/2015   Procedure: Left Heart Cath and Cors/Grafts Angiography;  Surgeon: [name], MD;  Location: [place] [facility];  Service: Cardiovascular;  Laterality: N/A;   CATARACT EXTRACTION W/ INTRAOCULAR LENS IMPLANT Bilateral 07/2018, 08/2018   COLONOSCOPY  06/2012   hyperplastic polyp, diverticulosis (jacobs) rec rpt 5 yrs   COLONOSCOPY WITH PROPOFOL N/A 08/10/2017   TA, rpt 5 yrs Ardis Hughs, Melene Plan, MD)   CORONARY ARTERY BYPASS GRAFT N/A 02/07/2014   Procedure: CORONARY ARTERY BYPASS GRAFTING (CABG);  Surgeon: [name], MD;  Location: [place];  Service: Open Heart Surgery;  Laterality: N/A;  CABG X 3, BILATERAL LIMA, EVH   CYSTOSCOPY  08/16/08   Bladder Cancer   EPIDURAL BLOCK INJECTION Left 09/2014, 10/2014, 12/2014   medial L2,3,4, dorsal L5 ramus blocks x2,  L L5/S1 and S1/2 transforaminal ESI ([place])   ESI  04/2013, 06/2013   L L5S1, S12 transforaminal ESI (Dr.  [name])   ESI Left 04/2014, 05/2014, 06/2014   L5/S1, S1/2; rpt; L4/5   ESI  03/2016   R L5/S1 interlaminar ESI   ESOPHAGEAL MANOMETRY N/A 08/10/2017   Procedure: ESOPHAGEAL MANOMETRY (EM);  Surgeon: [name], MD;  Location: [facility];  Service: Endoscopy;  Laterality: N/A;   ESOPHAGOGASTRODUODENOSCOPY (EGD) WITH PROPOFOL N/A 08/10/2017   Procedure: ESOPHAGOGASTRODUODENOSCOPY (EGD) WITH PROPOFOL;  Surgeon: [name], MD;  Location: [facility];  Service: Endoscopy;  Laterality: N/A;   FULGURATION OF BLADDER TUMOR  01/2017   recurrent 47mm L lateral wall papillary transitional cell carcinoma ([place])   INTRAOPERATIVE TRANSESOPHAGEAL ECHOCARDIOGRAM N/A 02/07/2014   Procedure: INTRAOPERATIVE TRANSESOPHAGEAL ECHOCARDIOGRAM;  Surgeon: [name], MD;  Location: [place];  Service: Open Heart Surgery;  Laterality: N/A;   LEFT HEART CATH AND CORS/GRAFTS ANGIOGRAPHY N/A 02/11/2017   Procedure: LEFT HEART CATH AND CORS/GRAFTS ANGIOGRAPHY;  Surgeon: [name], MD;  Location: [place] [facility];  Service: Cardiovascular;  Laterality: N/A;   LEFT HEART  CATH AND CORS/GRAFTS ANGIOGRAPHY N/A 02/02/2019   Procedure: LEFT HEART CATH AND CORS/GRAFTS ANGIOGRAPHY;  Surgeon: Belva Crome, MD;  Location: [place] [facility];  Service: Cardiovascular;  Laterality: N/A;   LEFT HEART CATHETERIZATION WITH CORONARY ANGIOGRAM N/A 02/01/2014   Procedure: LEFT HEART CATHETERIZATION WITH CORONARY ANGIOGRAM;  Surgeon: [name], MD;  Location: [facility] C[facility];  Service: Cardiovascular;  Laterality: N/A;   PRP epidural injection Left 11/2015   L5/S1, S1/2 transforaminal epidural PRP injections under fluoroscopy ([place])   REPLACEMENT TOTAL HIP W/  RESURFACING IMPLANTS Left    TONSILLECTOMY AND ADENOIDECTOMY  1973   TOTAL HIP ARTHROPLASTY Left 03/08/2017   TOTAL HIP ARTHROPLASTY Left 03/08/2017   Procedure: LEFT TOTAL HIP ARTHROPLASTY ANTERIOR APPROACH;  Surgeon: [name], MD;  Location: [place];  Service: Orthopedics;  Laterality: Left;   VASECTOMY     ZENKER'S DIVERTICULECTOMY  09/2017    Prior to Admission medications   Medication Sig Start Date End Date Taking? Authorizing Provider  acetaminophen (TYLENOL) 325 MG tablet Take 2 tablets (650 mg total) by mouth every 6 (six) hours as needed for moderate pain or headache. 07/14/18  Yes [name], MD  albuterol [facility] HFA) [phone] Base) MCG/ACT inhaler Inhale 2 puffs into the lungs every 4 (four) hours as needed for wheezing or shortness of breath. 03/21/19  Yes Magdalen Spatz, NP  amLODipine (NORVASC) 5 MG tablet TAKE 1 TABLET BY MOUTH EVERY DAY Patient taking differently: Take 5 mg by mouth daily.  02/08/19  Yes Josue Hector, MD  aspirin EC 81 MG tablet Take 1 tablet (81 mg total) by mouth daily. 02/21/19  Yes Imogene Burn, PA-C  atorvastatin (LIPITOR) 20 MG tablet TAKE 1 TABLET (20 MG TOTAL) BY MOUTH DAILY AT 6 PM. 08/16/18  Yes [name], MD  calcium carbonate (TUMS - DOSED IN MG ELEMENTAL CALCIUM) 500 MG chewable tablet Chew 2 tablets by mouth daily as needed  for indigestion or heartburn.   Yes [provider]  Carboxymethylcellul-Glycerin (LUBRICATING EYE DROPS OP) Place 1 drop into both eyes daily as needed (dry eyes).   Yes [provider]  cholecalciferol (VITAMIN D3) 25 MCG (1000 UT) tablet Take 1,000 Units by mouth daily.   Yes [provider]  Glycopyrrolate-Formoterol (BEVESPI AEROSPHERE) 9-4.8 MCG/ACT AERO Inhale  2 puffs into the lungs 2 (two) times daily. 04/11/19  Yes Martyn Ehrich, NP  Guaifenesin [facility] MAXIMUM STRENGTH) 1200 MG TB12 Take 1 tablet (1,200 mg total) by mouth 2 (two) times daily. 04/11/19  Yes Magdalen Spatz, NP  ketotifen (ZADITOR) 0.025 % ophthalmic solution Place 1 drop into both eyes daily as needed (allergies).   Yes [provider]  metoprolol tartrate (LOPRESSOR) 25 MG tablet TAKE 0.5 TABLETS (12.5 MG TOTAL) BY MOUTH 2 (TWO) TIMES DAILY. 04/02/19  Yes Josue Hector, MD  nystatin (MYCOSTATIN) 100000 UNIT/ML suspension Swish and swallow 54mL four times daily x5-7days 04/11/19  Yes Magdalen Spatz, NP  oxyCODONE (ROXICODONE) 5 MG immediate release tablet Take 1 tablet (5 mg total) by mouth every 6 (six) hours as needed for severe pain. 02/28/19  Yes [name], MD  Respiratory Therapy Supplies (FLUTTER) DEVI Use as directed 04/11/19  Yes Magdalen Spatz, NP  Simethicone (GAS-X PO) Take 1 tablet by mouth daily as needed (gas).   Yes [provider]  tamsulosin (FLOMAX) 0.4 MG CAPS capsule Take 1 capsule (0.4 mg total) by mouth daily after supper. 11/08/18  Yes [name], MD  vitamin B-12 (CYANOCOBALAMIN) 500 MCG tablet Take 1 tablet (500 mcg total) by mouth every other day. Patient taking differently: Take 500 mcg by mouth every other day. Took on Monday 12/29/17  Yes [name], MD  famotidine (PEPCID) 20 MG tablet TAKE 1 TABLET BY MOUTH TWICE A DAY 03/22/19   [name], MD  glucose blood (ONE TOUCH ULTRA TEST) test strip CHECK ONCE DAILY AND AS  NEEDED 10/21/14   [provider]  ipratropium-albuterol (DUONEB) 0.5-2.5 (3) MG/3ML SOLN TAKE 3 MLS BY NEBULIZATION EVERY 6 (SIX) HOURS AS NEEDED FOR UP TO 30 DAYS. 01/29/19 02/28/19  Fenton Foy, NP  omeprazole (PRILOSEC) 40 MG capsule Take 1 capsule (40 mg total) by mouth daily. Take for 3 weeks daily then as needed Patient not taking: Reported on 04/17/2019 03/22/19   [name], MD    Scheduled Meds:  amLODipine  5 mg Oral Daily   metoprolol tartrate  12.5 mg Oral BID   pantoprazole  40 mg Oral Daily   tamsulosin  0.4 mg Oral QPC supper   Infusions:  sodium chloride     piperacillin-tazobactam (ZOSYN)  IV Stopped (04/18/19 0747)   PRN Meds: HYDROmorphone (DILAUDID) injection, ipratropium-albuterol, ondansetron **OR** ondansetron (ZOFRAN) IV   Allergies as of 04/17/2019 - Review Complete 04/17/2019  Allergen Reaction Noted   Codeine Other (See Comments) 03/24/2012   Doxycycline Hives and Other (See Comments) 06/24/2015    Family History  Problem Relation Age of Onset   Diabetes Mother    Arrhythmia Mother        pacemaker   COPD Mother    Thyroid disease Mother    Hyperlipidemia Mother    Hypertension Mother    Bladder Cancer Mother    Diabetes Father    CAD Father 81       CHF, MI   Dementia Father    Heart attack Father    Diabetes Sister    Heart attack Maternal Grandmother    AAA (abdominal aortic aneurysm) Maternal Grandmother    Colon cancer Neg Hx    Stomach cancer Neg Hx     Social History   Socioeconomic History   Marital status: Married    Spouse name: Not on file   Number of children: 2   Years of education: Not  on file   Highest education level: Not on file  Occupational History   Occupation: Self Employed  Social Designer, fashion/clothing strain: Not on file   Food insecurity    Worry: Not on file    Inability: Not on file   Transportation needs    Medical: Not on file    Non-medical:  Not on file  Tobacco Use   Smoking status: Former Smoker    Packs/day: 1.00    Years: 40.00    Pack years: 40.00    Types: Cigarettes    Quit date: 02/05/2014    Years since quitting: 5.2   Smokeless tobacco: Never Used  Substance and Sexual Activity   Alcohol use: Yes    Alcohol/week: 0.0 standard drinks    Comment: 3-4 drinks per month   Drug use: No   Sexual activity: Not Currently  Lifestyle   Physical activity    Days per week: Not on file    Minutes per session: Not on file   Stress: Not on file  Relationships   Social connections    Talks on phone: Not on file    Gets together: Not on file    Attends religious service: Not on file    Active member of club or organization: Not on file    Attends meetings of clubs or organizations: Not on file    Relationship status: Not on file   Intimate partner violence    Fear of current or ex partner: Not on file    Emotionally abused: Not on file    Physically abused: Not on file    Forced sexual activity: Not on file  Other Topics Concern   Not on file  Social History Narrative   Caffeine: 1 cup coffee/day   Lives with wife, 1 dog   grown children   Occupation: Sports administrator scrap/salvage - self employed.  Disability after CABG   Edu: 2 yrs college   Activity: fishing, walking occasionally   Diet: good water, fruits/vegetables daily    REVIEW OF SYSTEMS: Constitutional: No fatigue, some moderate to mild weakness. ENT:  No nose bleeds Pulm: More short of breath with exertion.  However he was able to mow his lawn twice on separate days since onset of symptoms last Thursday. CV:  No palpitations, no LE edema.  No angina GU:  No hematuria, no frequency GI: See HPI Heme: Unusual or excessive bleeding/bruising. Transfusions: None Neuro:  No headaches, no peripheral tingling or numbness.  Not dizzy and no syncope Derm: A few hours of hives last week per HPI. Endocrine:  No sweats or chills.  No polyuria or  dysuria Immunization: Exudation history reviewed.  Up-to-date on flu shot which he received on 03/22/2019    PHYSICAL EXAM: Vital signs in last 24 hours: Vitals:   04/18/19 0530 04/18/19 0610  BP:  131/80  Pulse: 71 87  Resp:  19  Temp:    SpO2: 98% 94%   Wt Readings from Last 3 Encounters:  04/18/19 94.7 kg  04/11/19 94.7 kg  04/10/19 93.9 kg    General: Patient does not look ill.  He suntanned so I am not able to appreciate jaundice. Head: No facial asymmetry or swelling.  No signs of head trauma. Eyes: Slight scleral icterus.  No conjunctival pallor.  EOMI Ears: Not hard of hearing Nose: No congestion, no discharge Mouth: Oropharynx moist, pink, clear.  Tongue midline. Neck: No JVD, no masses, no thyromegaly Lungs: Diminished breath sounds but  no labored breathing or cough.  No adventitious sounds. Heart: RRR.  No MRG.  S1, S2 present. Abdomen: Soft.  Mild to moderate tenderness in the right upper quadrant (received pain meds prior to my exam).  Hypoactive bowel sounds but none tinkling or tympanitic or high-pitched.  No HSM, masses, bruits Rectal: Deferred. Musc/Skeltl: No joint redness, swelling or significant deformity. Extremities: No CCE. Neurologic: Oriented x3.  Alert.  Very detailed historian has everything written down on a note. Skin: Very suntanned.  I do not appreciate jaundice. Nodes: No cervical adenopathy Psych: Affect normal.  Fluid speech.  Intake/Output from previous day: 10/06 0701 - 10/07 0700 In: 1100 [IV Piggyback:1100] Out: 500 [Urine:500] Intake/Output this shift: No intake/output data recorded.  LAB RESULTS: Recent Labs    04/17/19 1132 04/18/19 0350  WBC 13.2* 7.6  HGB 17.3* 14.8  HCT 49.8 44.2  PLT 284 237   BMET Lab Results  Component Value Date   NA 137 04/18/2019   NA 137 04/17/2019   NA 142 01/31/2019   K 3.7 04/18/2019   K 3.9 04/17/2019   K 4.4 01/31/2019   CL 99 04/18/2019   CL 100 04/17/2019   CL 102 01/31/2019    CO2 24 04/18/2019   CO2 26 04/17/2019   CO2 23 01/31/2019   GLUCOSE 109 (H) 04/18/2019   GLUCOSE 138 (H) 04/17/2019   GLUCOSE 107 (H) 01/31/2019   BUN 8 04/18/2019   BUN 15 04/17/2019   BUN 15 01/31/2019   CREATININE 0.72 04/18/2019   CREATININE 0.89 04/17/2019   CREATININE 0.78 01/31/2019   CALCIUM 8.6 (L) 04/18/2019   CALCIUM 9.2 04/17/2019   CALCIUM 9.6 01/31/2019   LFT Recent Labs    04/17/19 1132 04/18/19 0350  PROT 7.4 6.3*  ALBUMIN 3.9 3.4*  AST 519* 205*  ALT 223* 146*  ALKPHOS 280* 243*  BILITOT 5.5* 3.5*   PT/INR Lab Results  Component Value Date   INR 1.1 04/18/2019   INR 1.1 02/10/2017   INR 0.99 07/09/2015   Hepatitis Panel No results for input(s): HEPBSAG, HCVAB, HEPAIGM, HEPBIGM in the last 72 hours. C-Diff No components found for: CDIFF Lipase     Component Value Date/Time   LIPASE 17 04/17/2019 1132    Drugs of Abuse  No results found for: LABOPIA, COCAINSCRNUR, LABBENZ, AMPHETMU, THCU, LABBARB   RADIOLOGY STUDIES: Ct Abdomen Pelvis W Contrast  Result Date: 04/17/2019 CLINICAL DATA:  63 year old presenting with acute onset of RIGHT UPPER QUADRANT and RIGHT LOWER QUADRANT abdominal pain and fever. Surgical history includes TURBT and endovascular abdominal aortic aneurysm repair. EXAM: CT ABDOMEN AND PELVIS WITH CONTRAST TECHNIQUE: Multidetector CT imaging of the abdomen and pelvis was performed using the standard protocol following bolus administration of intravenous contrast. CONTRAST:  14mL OMNIPAQUE IOHEXOL 300 MG/ML IV. COMPARISON:  12/26/2017 and earlier. FINDINGS: Lower chest: Visualized lung bases clear. Normal heart size. Hepatobiliary: Geographic areas of hepatic steatosis involving the RIGHT lobe. No hepatic parenchymal masses. Calcified granuloma in the lateral segment LEFT lobe. Mildly distended gallbladder with mucosal enhancement, but no evidence of cholelithiasis. No biliary ductal dilation. Pancreas: Normal in appearance without  evidence of mass, ductal dilation, or inflammation. Spleen: Normal in size and appearance. Two small accessory splenules MEDIAL and INFERIOR to the spleen. Adrenals/Urinary Tract: Normal appearing adrenal glands. Benign cyst arising from the LOWER pole the RIGHT kidney. Focal areas of scarring in the UPPER pole RIGHT kidney and mid LEFT kidney. No urinary tract calculi on either side. No hydronephrosis.  No solid renal masses. Normal appearing decompressed urinary bladder (allowing for the metallic beam hardening streak artifact from the LEFT hip prosthesis which partially obscures the bladder). Stomach/Bowel: Stomach normal in appearance for the degree of distention. Large wide-mouth diverticulum arising from the junction of the descending and transverse duodenum, containing gas. Remaining small bowel normal in appearance, though several small bowel loops are present LATERAL to the descending colon. Diffuse colonic diverticulosis, extensive in the distal descending and sigmoid colon, without evidence of acute diverticulitis. Normal appendix in the RIGHT UPPER pelvis. Vascular/Lymphatic: Prior endovascular abdominal aortic aneurysm repair with aorto bi-iliac stent grafts. The grafts are patent. Mural thrombus is present within the aortic portion of the stent graft. Native aorto-iliofemoral atherosclerosis. Normal-appearing portal venous and systemic venous systems. No pathologic lymphadenopathy. Reproductive: Normal-appearing prostate gland and seminal vesicles for patient age. Other: None. Musculoskeletal: LEFT hip arthroplasty with anatomic alignment. Thoracolumbar dextroscoliosis. Osseous demineralization. Degenerative disc disease and spondylosis at L4-5 and L5-S1. Diffuse facet degenerative changes throughout the lumbar spine. No acute findings. IMPRESSION: 1. Borderline findings of possible acute cholecystitis. Consider RIGHT UPPER QUADRANT abdominal ultrasound to confirm or deny this possibility. 2. No acute  abnormalities otherwise involving the abdomen or pelvis. 3. Geographic areas of hepatic steatosis involving the RIGHT lobe. No hepatic parenchymal masses. 4. Large wide-mouth diverticulum arising from the junction of the descending and transverse duodenum. 5. Diffuse colonic diverticulosis without evidence of acute diverticulitis. 6. Prior endovascular abdominal aortic aneurysm repair with aorto bi-iliac stent grafts. The grafts are patent, though mural thrombus is present within the aortic portion of the graft. Aortic Atherosclerosis (ICD10-I70.0). Electronically Signed   By: Evangeline Dakin M.D.   On: 04/17/2019 16:27   Dg Chest Portable 1 View  Result Date: 04/17/2019 CLINICAL DATA:  Cough. EXAM: PORTABLE CHEST 1 VIEW COMPARISON:  11/10/2018 FINDINGS: Stable changes from prior CABG surgery. Cardiac silhouette is normal in size and configuration. No mediastinal or hilar masses. Lungs are hyperexpanded. Prominent bronchovascular markings most evident in the lower lungs. Minor linear scarring at the right lateral lung base, stable. Lungs are otherwise clear. No pleural effusion or pneumothorax. Skeletal structures are demineralized but grossly intact. IMPRESSION: 1. No acute cardiopulmonary disease. 2. Hyperexpanded lungs suggests COPD. Stable changes from prior CABG surgery. Electronically Signed   By: Lajean Manes M.D.   On: 04/17/2019 14:49   US Abdomen Limited Ruq  Result Date: 04/17/2019 CLINICAL DATA:  Right upper quadrant pain for 6 days. EXAM: ULTRASOUND ABDOMEN LIMITED RIGHT UPPER QUADRANT COMPARISON:  CT angiogram of the abdomen and pelvis 12/26/2017. FINDINGS: Gallbladder: There is some sludge within the gallbladder. No gallstones or wall thickening visualized. No sonographic Murphy sign noted by sonographer. Common bile duct: Diameter: 0.5 cm Liver: Echogenicity is increased. No focal lesion. Portal vein is patent on color Doppler imaging with normal direction of blood flow towards the liver.  Other: None. IMPRESSION: Gallbladder sludge.  Negative for stones or cholecystitis. Fatty infiltration of the liver. Electronically Signed   By: Inge Rise M.D.   On: 04/17/2019 13:24     IMPRESSION:   *   Close to 1 week of right upper quadrant pain with single episode of emesis and diarrhea.  LFTs elevated.  GB sludge, fatty liver, normal ducts on ultrasound.  Gallbladder distention but no stones seen and ducts normal on CT.  MRCP pending. Clinical history seems consistent with some sort of cholecystitis, possibly chronic versus acute.  Patient may have also passed a stone through the bile duct  despite the normal ductal diameter seen on imaging studies.  *    Recurrent symptomatic Zenker's diverticulum with previous cricopharyngeal myotomy for Zenker's in 09/2017.  To have this repaired with Dr. Harl Bowie at [facility] on 04/26/2019.    PLAN:     *    Await MRCP.  If this is inconclusive, consider HIDA scan.   Azucena Freed  04/18/2019, 8:43 AM Phone [phone]

## 2019-04-18 NOTE — Progress Notes (Addendum)
[facility] Consult: 8:43 AM 04/18/2019  LOS: 1 day    Referring Provider: Dr Aileen Fass  Primary Care Physician:  [name], MD Primary Gastroenterologist:  Dr. Ardis Hughs.        Reason for Consultation:  RUQ abdominal pain, nausea, vomiting, fever.   HPI: Carl Lee is a 63 y.o. male.  Hx Peripheral vascular disease, s/p 2013 endovascular AAA repair with aorto by iliac stent grafts.  COPD.  Bladder cancer 2010 recurrent in 2015, 2018 treated with fulguration.  CAD, s/p CABG.  Fatty liver.  Spinal stenosis.  07/2017 colonoscopy.  For polyp surveillance.  Adenomatous polyps 06/2012, 06/2009. 4 polyps resected and retrieved.  Pandiverticulosis. 07/2017 EGD.  For placement of manometry probe given known Zenker's diverticulum.  In addition to the Zenker's diverticulum, mild gastritis noted. 07/2017 esophageal manometry.  Normal relaxation of the EG junction.  No significant esophageal peristaltic abnormality.   09/2017 Zenker's cricopharyngeal myotomy and preventative endoscopic clips to prevent bleeding.  Also noted was medium sized hiatal hernia and possible short segment Barrett's but no biopsy obtained.   Patient's dysphagia and odynophagia/neck pain, coughing with p.o. returned within a few months of the cricopharyngeal myotomy.  Had pneumonia, possibly aspiration related, in spring 2020. 12/2018 double contrast esophagram:   Relatively large diverticulum posterior to cervical esophagus consistent with Zenker's diverticulum. Pt scheduled for repeat Zenker's repair with Dr. Harl Bowie at [facility] on 04/2014.    Beginning last Thursday, pt developed right upper quadrant pain and belching. 8-10 intensity.  It persisted at various levels of severity over the next several days until he presented to the ED yesterday  morning.  Along the way he had fevers up to 100.1.  Diarrhea, pain in his lower abdomen, pain in his back behind his right upper quadrant pain though he denies that this is "radiating" pain.  Pain is worse after eating so he has not been eating or drinking much.  Feels more short of breath than baseline.  No productive cough.  Nonbloody emesis once yesterday.  Urine is very dark.  Wife notes he looks yellow.  He had 3 or 4 hours of "hives" last week, which he has had in the past.   Hydrocodone helps the pain.  T bili 5.5  >> 3.5.  Alkaline phosphatase 519  >> 243.  AST/ALT 519/223  >> 205/146. Lipase 17 CBC entirely normal as are electrolytes, troponin I. COVID 19 negative. Hepatitis B surface Ag negative.   Hepatitis A IgM, hepatitis B core IgM, HCV Ab pending. Abdominal ultrasound: GB sludge.  Fatty liver.  CBD 5 mm. CTAP with contrast shows areas of hepatic steatosis in the right lobe, calcified granuloma in the left liver lobe.  Gallbladder mildly distended with enhanced mucosa but no cholelithiasis, borderline findings for acute cholecystitis..  No biliary ductal dilatation.  Pancreas and pancreatic duct normal.  2 small splenules.  Diverticula at the junction of descending/transverse duodenum containing gas.  Cyst and scarring in the kidneys.  Fuhs colonic diverticulosis.  Patent aortobiiliac stent grafts with mural thrombus in the aortic  portion of the graft.  MRCP ordered.  General surgery PA has seen the patient, is not convinced this is acute cholecystitis due to improving LFTs.  Recommend GI consult.  Note mentions patient will need cardiac clearance for any surgical procedure.  Patient is disabled, retired from his business in Air traffic controller.  Lives with his wife in [place].  Quit smoking around 5 years ago.  2 alcohol drinks/month.    Past Medical History:  Diagnosis Date   AAA (abdominal aortic aneurysm) ([place]) 03/25/12   s/p endovascular repair   Adenomatous polyp 06/2009    Atrial tachycardia, paroxysmal ([place]) 06/18/2016   Bladder cancer ([place]) 08/16/2008   recurrence 2015 and 2018 treated with office fulguration   CAD (coronary artery disease), native coronary artery    S/p 3v CABG 01/2014    Cataract 2019   bilateral eyes   Childhood asthma    as a child   Complication of anesthesia    "takes me a year to get over"   Contact dermatitis    atypical [name])   Controlled diabetes mellitus type 2 with complications ([place]) [id number]   CAD    COPD (chronic obstructive pulmonary disease) ([place])    Coronary artery disease    a.2015: CABG w/ LIMA-LAD, RIMA-OM, SVG-RCA b. Cath on 07/10/15 w/ patent LIMA-LAD and RIMA-OM. SVG-RCA occluded but collaterals present   Diverticulosis 2013   severe by CT and colonoscopy   Environmental allergies    improved as ages   Fracture of lateral malleolus of left ankle 08/29/2013   Headache    hasn't had migraines in years   Hepatic steatosis 06/2015   by [place]   Hx of migraines    Hypertension    Lactose intolerance 11/14/2018   Endorses for years - avoids dairy products   Lumbosacral radiculopathy at S1 03/2012   left, with spinal stenosis (MRI 04/2013) improved with TF ESI L5/S1 and S1/2 ([place] [place])   OSA (obstructive sleep apnea) 09/04/2014   Severe with AHI 35/hr, uses BIPAP   Overweight (BMI 25.0-29.9) 01/26/2017   Pre-diabetes    Prediabetes 06/08/2014   CAD    Resistance to clopidogrel 2015   drug metabolism panel run - asked to scan   Spinal stenosis    LS1 nerve root impingement from bulging disc   Vitamin B12 deficiency    Zenker diverticulum     Past Surgical History:  Procedure Laterality Date   ABDOMINAL AORTIC ANEURYSM REPAIR  03/25/12   endovascular   ABI  05/2013   WNL, L TBI low at 0.66   Bladder cancer  March 2010 and Oct. 2015   Ernst Spell) x 2   CARDIAC CATHETERIZATION N/A 07/10/2015   Procedure: Left Heart Cath and Cors/Grafts Angiography;  Surgeon: [name], MD;  Location: [place] [facility];  Service: Cardiovascular;  Laterality: N/A;   CATARACT EXTRACTION W/ INTRAOCULAR LENS IMPLANT Bilateral 07/2018, 08/2018   COLONOSCOPY  06/2012   hyperplastic polyp, diverticulosis ([place]) rec rpt 5 yrs   COLONOSCOPY WITH PROPOFOL N/A 08/10/2017   TA, rpt 5 yrs Ardis Hughs, Melene Plan, MD)   CORONARY ARTERY BYPASS GRAFT N/A 02/07/2014   Procedure: CORONARY ARTERY BYPASS GRAFTING (CABG);  Surgeon: [name], MD;  Location: [place];  Service: Open Heart Surgery;  Laterality: N/A;  CABG X 3, BILATERAL LIMA, EVH   CYSTOSCOPY  08/16/08   Bladder Cancer   EPIDURAL BLOCK INJECTION Left 09/2014, 10/2014, 12/2014   medial L2,3,4, dorsal L5 ramus blocks x2,  L L5/S1 and S1/2 transforaminal ESI ([place])   ESI  04/2013, 06/2013   L L5S1, S12 transforaminal ESI (Dr.  [name])   ESI Left 04/2014, 05/2014, 06/2014   L5/S1, S1/2; rpt; L4/5   ESI  03/2016   R L5/S1 interlaminar ESI   ESOPHAGEAL MANOMETRY N/A 08/10/2017   Procedure: ESOPHAGEAL MANOMETRY (EM);  Surgeon: [name], MD;  Location: [facility];  Service: Endoscopy;  Laterality: N/A;   ESOPHAGOGASTRODUODENOSCOPY (EGD) WITH PROPOFOL N/A 08/10/2017   Procedure: ESOPHAGOGASTRODUODENOSCOPY (EGD) WITH PROPOFOL;  Surgeon: [name], MD;  Location: [facility];  Service: Endoscopy;  Laterality: N/A;   FULGURATION OF BLADDER TUMOR  01/2017   recurrent 32mm L lateral wall papillary transitional cell carcinoma ([place])   INTRAOPERATIVE TRANSESOPHAGEAL ECHOCARDIOGRAM N/A 02/07/2014   Procedure: INTRAOPERATIVE TRANSESOPHAGEAL ECHOCARDIOGRAM;  Surgeon: [name], MD;  Location: [place];  Service: Open Heart Surgery;  Laterality: N/A;   LEFT HEART CATH AND CORS/GRAFTS ANGIOGRAPHY N/A 02/11/2017   Procedure: LEFT HEART CATH AND CORS/GRAFTS ANGIOGRAPHY;  Surgeon: [name], MD;  Location: [place] [facility];  Service: Cardiovascular;  Laterality: N/A;   LEFT HEART  CATH AND CORS/GRAFTS ANGIOGRAPHY N/A 02/02/2019   Procedure: LEFT HEART CATH AND CORS/GRAFTS ANGIOGRAPHY;  Surgeon: Belva Crome, MD;  Location: [place] [facility];  Service: Cardiovascular;  Laterality: N/A;   LEFT HEART CATHETERIZATION WITH CORONARY ANGIOGRAM N/A 02/01/2014   Procedure: LEFT HEART CATHETERIZATION WITH CORONARY ANGIOGRAM;  Surgeon: [name], MD;  Location: [facility];  Service: Cardiovascular;  Laterality: N/A;   PRP epidural injection Left 11/2015   L5/S1, S1/2 transforaminal epidural PRP injections under fluoroscopy ([place])   REPLACEMENT TOTAL HIP W/  RESURFACING IMPLANTS Left    TONSILLECTOMY AND ADENOIDECTOMY  1973   TOTAL HIP ARTHROPLASTY Left 03/08/2017   TOTAL HIP ARTHROPLASTY Left 03/08/2017   Procedure: LEFT TOTAL HIP ARTHROPLASTY ANTERIOR APPROACH;  Surgeon: [name], MD;  Location: [place];  Service: Orthopedics;  Laterality: Left;   VASECTOMY     ZENKER'S DIVERTICULECTOMY  09/2017    Prior to Admission medications   Medication Sig Start Date End Date Taking? Authorizing Provider  acetaminophen (TYLENOL) 325 MG tablet Take 2 tablets (650 mg total) by mouth every 6 (six) hours as needed for moderate pain or headache. 07/14/18  Yes [name], MD  albuterol [facility] HFA) [phone] Base) MCG/ACT inhaler Inhale 2 puffs into the lungs every 4 (four) hours as needed for wheezing or shortness of breath. 03/21/19  Yes Magdalen Spatz, NP  amLODipine (NORVASC) 5 MG tablet TAKE 1 TABLET BY MOUTH EVERY DAY Patient taking differently: Take 5 mg by mouth daily.  02/08/19  Yes Josue Hector, MD  aspirin EC 81 MG tablet Take 1 tablet (81 mg total) by mouth daily. 02/21/19  Yes Imogene Burn, PA-C  atorvastatin (LIPITOR) 20 MG tablet TAKE 1 TABLET (20 MG TOTAL) BY MOUTH DAILY AT 6 PM. 08/16/18  Yes [name], MD  calcium carbonate (TUMS - DOSED IN MG ELEMENTAL CALCIUM) 500 MG chewable tablet Chew 2 tablets by mouth daily as needed  for indigestion or heartburn.   Yes [provider]  Carboxymethylcellul-Glycerin (LUBRICATING EYE DROPS OP) Place 1 drop into both eyes daily as needed (dry eyes).   Yes [provider]  cholecalciferol (VITAMIN D3) 25 MCG (1000 UT) tablet Take 1,000 Units by mouth daily.   Yes [provider]  Glycopyrrolate-Formoterol (BEVESPI AEROSPHERE) 9-4.8 MCG/ACT AERO Inhale  2 puffs into the lungs 2 (two) times daily. 04/11/19  Yes Martyn Ehrich, NP  Guaifenesin [facility] MAXIMUM STRENGTH) 1200 MG TB12 Take 1 tablet (1,200 mg total) by mouth 2 (two) times daily. 04/11/19  Yes Magdalen Spatz, NP  ketotifen (ZADITOR) 0.025 % ophthalmic solution Place 1 drop into both eyes daily as needed (allergies).   Yes [provider]  metoprolol tartrate (LOPRESSOR) 25 MG tablet TAKE 0.5 TABLETS (12.5 MG TOTAL) BY MOUTH 2 (TWO) TIMES DAILY. 04/02/19  Yes Josue Hector, MD  nystatin (MYCOSTATIN) 100000 UNIT/ML suspension Swish and swallow 41mL four times daily x5-7days 04/11/19  Yes Magdalen Spatz, NP  oxyCODONE (ROXICODONE) 5 MG immediate release tablet Take 1 tablet (5 mg total) by mouth every 6 (six) hours as needed for severe pain. 02/28/19  Yes [name], MD  Respiratory Therapy Supplies (FLUTTER) DEVI Use as directed 04/11/19  Yes Magdalen Spatz, NP  Simethicone (GAS-X PO) Take 1 tablet by mouth daily as needed (gas).   Yes [provider]  tamsulosin (FLOMAX) 0.4 MG CAPS capsule Take 1 capsule (0.4 mg total) by mouth daily after supper. 11/08/18  Yes [name], MD  vitamin B-12 (CYANOCOBALAMIN) 500 MCG tablet Take 1 tablet (500 mcg total) by mouth every other day. Patient taking differently: Take 500 mcg by mouth every other day. Took on Monday 12/29/17  Yes [name], MD  famotidine (PEPCID) 20 MG tablet TAKE 1 TABLET BY MOUTH TWICE A DAY 03/22/19   [name], MD  glucose blood (ONE TOUCH ULTRA TEST) test strip CHECK ONCE DAILY AND AS  NEEDED 10/21/14   [provider]  ipratropium-albuterol (DUONEB) 0.5-2.5 (3) MG/3ML SOLN TAKE 3 MLS BY NEBULIZATION EVERY 6 (SIX) HOURS AS NEEDED FOR UP TO 30 DAYS. 01/29/19 02/28/19  Fenton Foy, NP  omeprazole (PRILOSEC) 40 MG capsule Take 1 capsule (40 mg total) by mouth daily. Take for 3 weeks daily then as needed Patient not taking: Reported on 04/17/2019 03/22/19   [name], MD    Scheduled Meds:  amLODipine  5 mg Oral Daily   metoprolol tartrate  12.5 mg Oral BID   pantoprazole  40 mg Oral Daily   tamsulosin  0.4 mg Oral QPC supper   Infusions:  sodium chloride     piperacillin-tazobactam (ZOSYN)  IV Stopped (04/18/19 0747)   PRN Meds: HYDROmorphone (DILAUDID) injection, ipratropium-albuterol, ondansetron **OR** ondansetron (ZOFRAN) IV   Allergies as of 04/17/2019 - Review Complete 04/17/2019  Allergen Reaction Noted   Codeine Other (See Comments) 03/24/2012   Doxycycline Hives and Other (See Comments) 06/24/2015    Family History  Problem Relation Age of Onset   Diabetes Mother    Arrhythmia Mother        pacemaker   COPD Mother    Thyroid disease Mother    Hyperlipidemia Mother    Hypertension Mother    Bladder Cancer Mother    Diabetes Father    CAD Father 68       CHF, MI   Dementia Father    Heart attack Father    Diabetes Sister    Heart attack Maternal Grandmother    AAA (abdominal aortic aneurysm) Maternal Grandmother    Colon cancer Neg Hx    Stomach cancer Neg Hx     Social History   Socioeconomic History   Marital status: Married    Spouse name: Not on file   Number of children: 2   Years of education: Not  on file   Highest education level: Not on file  Occupational History   Occupation: Self Employed  Social Designer, fashion/clothing strain: Not on file   Food insecurity    Worry: Not on file    Inability: Not on file   Transportation needs    Medical: Not on file    Non-medical:  Not on file  Tobacco Use   Smoking status: Former Smoker    Packs/day: 1.00    Years: 40.00    Pack years: 40.00    Types: Cigarettes    Quit date: 02/05/2014    Years since quitting: 5.2   Smokeless tobacco: Never Used  Substance and Sexual Activity   Alcohol use: Yes    Alcohol/week: 0.0 standard drinks    Comment: 3-4 drinks per month   Drug use: No   Sexual activity: Not Currently  Lifestyle   Physical activity    Days per week: Not on file    Minutes per session: Not on file   Stress: Not on file  Relationships   Social connections    Talks on phone: Not on file    Gets together: Not on file    Attends religious service: Not on file    Active member of club or organization: Not on file    Attends meetings of clubs or organizations: Not on file    Relationship status: Not on file   Intimate partner violence    Fear of current or ex partner: Not on file    Emotionally abused: Not on file    Physically abused: Not on file    Forced sexual activity: Not on file  Other Topics Concern   Not on file  Social History Narrative   Caffeine: 1 cup coffee/day   Lives with wife, 1 dog   grown children   Occupation: Sports administrator scrap/salvage - self employed.  Disability after CABG   Edu: 2 yrs college   Activity: fishing, walking occasionally   Diet: good water, fruits/vegetables daily    REVIEW OF SYSTEMS: Constitutional: No fatigue, some moderate to mild weakness. ENT:  No nose bleeds Pulm: More short of breath with exertion.  However he was able to mow his lawn twice on separate days since onset of symptoms last Thursday. CV:  No palpitations, no LE edema.  No angina GU:  No hematuria, no frequency GI: See HPI Heme: Unusual or excessive bleeding/bruising. Transfusions: None Neuro:  No headaches, no peripheral tingling or numbness.  Not dizzy and no syncope Derm: A few hours of hives last week per HPI. Endocrine:  No sweats or chills.  No polyuria or  dysuria Immunization: Exudation history reviewed.  Up-to-date on flu shot which he received on 03/22/2019    PHYSICAL EXAM: Vital signs in last 24 hours: Vitals:   04/18/19 0530 04/18/19 0610  BP:  131/80  Pulse: 71 87  Resp:  19  Temp:    SpO2: 98% 94%   Wt Readings from Last 3 Encounters:  04/18/19 94.7 kg  04/11/19 94.7 kg  04/10/19 93.9 kg    General: Patient does not look ill.  He suntanned so I am not able to appreciate jaundice. Head: No facial asymmetry or swelling.  No signs of head trauma. Eyes: Slight scleral icterus.  No conjunctival pallor.  EOMI Ears: Not hard of hearing Nose: No congestion, no discharge Mouth: Oropharynx moist, pink, clear.  Tongue midline. Neck: No JVD, no masses, no thyromegaly Lungs: Diminished breath sounds but  no labored breathing or cough.  No adventitious sounds. Heart: RRR.  No MRG.  S1, S2 present. Abdomen: Soft.  Mild to moderate tenderness in the right upper quadrant (received pain meds prior to my exam).  Hypoactive bowel sounds but none tinkling or tympanitic or high-pitched.  No HSM, masses, bruits Rectal: Deferred. Musc/Skeltl: No joint redness, swelling or significant deformity. Extremities: No CCE. Neurologic: Oriented x3.  Alert.  Very detailed historian has everything written down on a note. Skin: Very suntanned.  I do not appreciate jaundice. Nodes: No cervical adenopathy Psych: Affect normal.  Fluid speech.  Intake/Output from previous day: 10/06 0701 - 10/07 0700 In: 1100 [IV Piggyback:1100] Out: 500 [Urine:500] Intake/Output this shift: No intake/output data recorded.  LAB RESULTS: Recent Labs    04/17/19 1132 04/18/19 0350  WBC 13.2* 7.6  HGB 17.3* 14.8  HCT 49.8 44.2  PLT 284 237   BMET Lab Results  Component Value Date   NA 137 04/18/2019   NA 137 04/17/2019   NA 142 01/31/2019   K 3.7 04/18/2019   K 3.9 04/17/2019   K 4.4 01/31/2019   CL 99 04/18/2019   CL 100 04/17/2019   CL 102 01/31/2019    CO2 24 04/18/2019   CO2 26 04/17/2019   CO2 23 01/31/2019   GLUCOSE 109 (H) 04/18/2019   GLUCOSE 138 (H) 04/17/2019   GLUCOSE 107 (H) 01/31/2019   BUN 8 04/18/2019   BUN 15 04/17/2019   BUN 15 01/31/2019   CREATININE 0.72 04/18/2019   CREATININE 0.89 04/17/2019   CREATININE 0.78 01/31/2019   CALCIUM 8.6 (L) 04/18/2019   CALCIUM 9.2 04/17/2019   CALCIUM 9.6 01/31/2019   LFT Recent Labs    04/17/19 1132 04/18/19 0350  PROT 7.4 6.3*  ALBUMIN 3.9 3.4*  AST 519* 205*  ALT 223* 146*  ALKPHOS 280* 243*  BILITOT 5.5* 3.5*   PT/INR Lab Results  Component Value Date   INR 1.1 04/18/2019   INR 1.1 02/10/2017   INR 0.99 07/09/2015   Hepatitis Panel No results for input(s): HEPBSAG, HCVAB, HEPAIGM, HEPBIGM in the last 72 hours. C-Diff No components found for: CDIFF Lipase     Component Value Date/Time   LIPASE 17 04/17/2019 1132    Drugs of Abuse  No results found for: LABOPIA, COCAINSCRNUR, LABBENZ, AMPHETMU, THCU, LABBARB   RADIOLOGY STUDIES: Ct Abdomen Pelvis W Contrast  Result Date: 04/17/2019 CLINICAL DATA:  63 year old presenting with acute onset of RIGHT UPPER QUADRANT and RIGHT LOWER QUADRANT abdominal pain and fever. Surgical history includes TURBT and endovascular abdominal aortic aneurysm repair. EXAM: CT ABDOMEN AND PELVIS WITH CONTRAST TECHNIQUE: Multidetector CT imaging of the abdomen and pelvis was performed using the standard protocol following bolus administration of intravenous contrast. CONTRAST:  13mL OMNIPAQUE IOHEXOL 300 MG/ML IV. COMPARISON:  12/26/2017 and earlier. FINDINGS: Lower chest: Visualized lung bases clear. Normal heart size. Hepatobiliary: Geographic areas of hepatic steatosis involving the RIGHT lobe. No hepatic parenchymal masses. Calcified granuloma in the lateral segment LEFT lobe. Mildly distended gallbladder with mucosal enhancement, but no evidence of cholelithiasis. No biliary ductal dilation. Pancreas: Normal in appearance without  evidence of mass, ductal dilation, or inflammation. Spleen: Normal in size and appearance. Two small accessory splenules MEDIAL and INFERIOR to the spleen. Adrenals/Urinary Tract: Normal appearing adrenal glands. Benign cyst arising from the LOWER pole the RIGHT kidney. Focal areas of scarring in the UPPER pole RIGHT kidney and mid LEFT kidney. No urinary tract calculi on either side. No hydronephrosis.  No solid renal masses. Normal appearing decompressed urinary bladder (allowing for the metallic beam hardening streak artifact from the LEFT hip prosthesis which partially obscures the bladder). Stomach/Bowel: Stomach normal in appearance for the degree of distention. Large wide-mouth diverticulum arising from the junction of the descending and transverse duodenum, containing gas. Remaining small bowel normal in appearance, though several small bowel loops are present LATERAL to the descending colon. Diffuse colonic diverticulosis, extensive in the distal descending and sigmoid colon, without evidence of acute diverticulitis. Normal appendix in the RIGHT UPPER pelvis. Vascular/Lymphatic: Prior endovascular abdominal aortic aneurysm repair with aorto bi-iliac stent grafts. The grafts are patent. Mural thrombus is present within the aortic portion of the stent graft. Native aorto-iliofemoral atherosclerosis. Normal-appearing portal venous and systemic venous systems. No pathologic lymphadenopathy. Reproductive: Normal-appearing prostate gland and seminal vesicles for patient age. Other: None. Musculoskeletal: LEFT hip arthroplasty with anatomic alignment. Thoracolumbar dextroscoliosis. Osseous demineralization. Degenerative disc disease and spondylosis at L4-5 and L5-S1. Diffuse facet degenerative changes throughout the lumbar spine. No acute findings. IMPRESSION: 1. Borderline findings of possible acute cholecystitis. Consider RIGHT UPPER QUADRANT abdominal ultrasound to confirm or deny this possibility. 2. No acute  abnormalities otherwise involving the abdomen or pelvis. 3. Geographic areas of hepatic steatosis involving the RIGHT lobe. No hepatic parenchymal masses. 4. Large wide-mouth diverticulum arising from the junction of the descending and transverse duodenum. 5. Diffuse colonic diverticulosis without evidence of acute diverticulitis. 6. Prior endovascular abdominal aortic aneurysm repair with aorto bi-iliac stent grafts. The grafts are patent, though mural thrombus is present within the aortic portion of the graft. Aortic Atherosclerosis (ICD10-I70.0). Electronically Signed   By: Evangeline Dakin M.D.   On: 04/17/2019 16:27   Dg Chest Portable 1 View  Result Date: 04/17/2019 CLINICAL DATA:  Cough. EXAM: PORTABLE CHEST 1 VIEW COMPARISON:  11/10/2018 FINDINGS: Stable changes from prior CABG surgery. Cardiac silhouette is normal in size and configuration. No mediastinal or hilar masses. Lungs are hyperexpanded. Prominent bronchovascular markings most evident in the lower lungs. Minor linear scarring at the right lateral lung base, stable. Lungs are otherwise clear. No pleural effusion or pneumothorax. Skeletal structures are demineralized but grossly intact. IMPRESSION: 1. No acute cardiopulmonary disease. 2. Hyperexpanded lungs suggests COPD. Stable changes from prior CABG surgery. Electronically Signed   By: Lajean Manes M.D.   On: 04/17/2019 14:49   US Abdomen Limited Ruq  Result Date: 04/17/2019 CLINICAL DATA:  Right upper quadrant pain for 6 days. EXAM: ULTRASOUND ABDOMEN LIMITED RIGHT UPPER QUADRANT COMPARISON:  CT angiogram of the abdomen and pelvis 12/26/2017. FINDINGS: Gallbladder: There is some sludge within the gallbladder. No gallstones or wall thickening visualized. No sonographic Murphy sign noted by sonographer. Common bile duct: Diameter: 0.5 cm Liver: Echogenicity is increased. No focal lesion. Portal vein is patent on color Doppler imaging with normal direction of blood flow towards the liver.  Other: None. IMPRESSION: Gallbladder sludge.  Negative for stones or cholecystitis. Fatty infiltration of the liver. Electronically Signed   By: Inge Rise M.D.   On: 04/17/2019 13:24     IMPRESSION:   *   Close to 1 week of right upper quadrant pain with single episode of emesis and diarrhea.  LFTs elevated.  GB sludge, fatty liver, normal ducts on ultrasound.  Gallbladder distention but no stones seen and ducts normal on CT.  MRCP pending. Clinical history seems consistent with some sort of cholecystitis, possibly chronic versus acute.  Patient may have also passed a stone through the bile duct  despite the normal ductal diameter seen on imaging studies.  *    Recurrent symptomatic Zenker's diverticulum with previous cricopharyngeal myotomy for Zenker's in 09/2017.  To have this repaired with Dr. Harl Bowie at [facility] on 04/26/2019.    PLAN:     *    Await MRCP.  If this is inconclusive, consider HIDA scan.   Azucena Freed  04/18/2019, 8:43 AM Phone [phone]

## 2019-04-18 NOTE — Progress Notes (Signed)
PHARMACY - PHYSICIAN COMMUNICATION CRITICAL VALUE ALERT - BLOOD CULTURE IDENTIFICATION (BCID)  Carl Lee is an 63 y.o. male who presented to [facility] on 04/17/2019 with a chief complaint of intra-abdominal infection  Assessment:  3/4 BC positive for Waldron pneumo  Name of physician (or Provider) Contacted: Hal Hope  Current antibiotics: zosyn  Changes to prescribed antibiotics recommended:  Could continue zosyn or change to rocephin and flagyl  Results for orders placed or performed during the hospital encounter of 04/17/19  Blood Culture ID Panel (Reflexed) (Collected: 04/17/2019 12:46 PM)  Result Value Ref Range   Enterococcus species NOT DETECTED NOT DETECTED   Listeria monocytogenes NOT DETECTED NOT DETECTED   Staphylococcus species NOT DETECTED NOT DETECTED   Staphylococcus aureus (BCID) NOT DETECTED NOT DETECTED   Streptococcus species NOT DETECTED NOT DETECTED   Streptococcus agalactiae NOT DETECTED NOT DETECTED   Streptococcus pneumoniae NOT DETECTED NOT DETECTED   Streptococcus pyogenes NOT DETECTED NOT DETECTED   Acinetobacter baumannii NOT DETECTED NOT DETECTED   Enterobacteriaceae species DETECTED (A) NOT DETECTED   Enterobacter cloacae complex NOT DETECTED NOT DETECTED   Escherichia coli NOT DETECTED NOT DETECTED   Klebsiella oxytoca NOT DETECTED NOT DETECTED   Klebsiella pneumoniae DETECTED (A) NOT DETECTED   Proteus species NOT DETECTED NOT DETECTED   Serratia marcescens NOT DETECTED NOT DETECTED   Carbapenem resistance NOT DETECTED NOT DETECTED   Haemophilus influenzae NOT DETECTED NOT DETECTED   Neisseria meningitidis NOT DETECTED NOT DETECTED   Pseudomonas aeruginosa NOT DETECTED NOT DETECTED   Candida albicans NOT DETECTED NOT DETECTED   Candida glabrata NOT DETECTED NOT DETECTED   Candida krusei NOT DETECTED NOT DETECTED   Candida parapsilosis NOT DETECTED NOT DETECTED   Candida tropicalis NOT DETECTED NOT DETECTED    Beverlee Nims 04/18/2019   6:29 AM

## 2019-04-19 ENCOUNTER — Encounter (HOSPITAL_COMMUNITY): Payer: Self-pay | Admitting: Anesthesiology

## 2019-04-19 ENCOUNTER — Inpatient Hospital Stay (HOSPITAL_COMMUNITY): Payer: PPO | Admitting: Certified Registered Nurse Anesthetist

## 2019-04-19 ENCOUNTER — Encounter (HOSPITAL_COMMUNITY)
Admission: EM | Disposition: A | Discharge status: Home / Self Care | Payer: Self-pay | Source: Home / Self Care | Attending: Internal Medicine

## 2019-04-19 ENCOUNTER — Inpatient Hospital Stay (HOSPITAL_COMMUNITY): Payer: PPO

## 2019-04-19 DIAGNOSIS — I2510 Atherosclerotic heart disease of native coronary artery without angina pectoris: Secondary | ICD-10-CM

## 2019-04-19 DIAGNOSIS — E785 Hyperlipidemia, unspecified: Secondary | ICD-10-CM

## 2019-04-19 DIAGNOSIS — K810 Acute cholecystitis: Secondary | ICD-10-CM

## 2019-04-19 HISTORY — PX: CHOLECYSTECTOMY: SHX55

## 2019-04-19 HISTORY — PX: INTRAOPERATIVE CHOLANGIOGRAM: SHX5230

## 2019-04-19 LAB — COMPREHENSIVE METABOLIC PANEL
ALT: 100 U/L — ABNORMAL HIGH (ref 0–44)
AST: 79 U/L — ABNORMAL HIGH (ref 15–41)
Albumin: 3.6 g/dL (ref 3.5–5.0)
Alkaline Phosphatase: 222 U/L — ABNORMAL HIGH (ref 38–126)
Anion gap: 13 (ref 5–15)
BUN: 9 mg/dL (ref 8–23)
CO2: 28 mmol/L (ref 22–32)
Calcium: 8.9 mg/dL (ref 8.9–10.3)
Chloride: 95 mmol/L — ABNORMAL LOW (ref 98–111)
Creatinine, Ser: 0.79 mg/dL (ref 0.61–1.24)
GFR calc Af Amer: >60 mL/min (ref >60)
GFR calc non Af Amer: >60 mL/min (ref >60)
Glucose, Bld: 94 mg/dL (ref 70–99)
Potassium: 3.5 mmol/L (ref 3.5–5.1)
Sodium: 136 mmol/L (ref 135–145)
Total Bilirubin: 2.1 mg/dL — ABNORMAL HIGH (ref 0.3–1.2)
Total Protein: 7 g/dL (ref 6.5–8.1)

## 2019-04-19 LAB — CBC
HCT: 46.4 % (ref 39.0–52.0)
Hemoglobin: 16.1 g/dL (ref 13.0–17.0)
MCH: 32.3 pg (ref 26.0–34.0)
MCHC: 34.7 g/dL (ref 30.0–36.0)
MCV: 93.2 fL (ref 80.0–100.0)
Platelets: 255 10*3/uL (ref 150–400)
RBC: 4.98 MIL/uL (ref 4.22–5.81)
RDW: 13.1 % (ref 11.5–15.5)
WBC: 7.8 10*3/uL (ref 4.0–10.5)
nRBC: 0 % (ref 0.0–0.2)

## 2019-04-19 LAB — SURGICAL PCR SCREEN
MRSA, PCR: NEGATIVE
Staphylococcus aureus: NEGATIVE

## 2019-04-19 SURGERY — LAPAROSCOPIC CHOLECYSTECTOMY WITH INTRAOPERATIVE CHOLANGIOGRAM
Anesthesia: General | Site: Abdomen

## 2019-04-19 MED ORDER — SUGAMMADEX SODIUM 200 MG/2ML IV SOLN
INTRAVENOUS | Status: DC | PRN
  Administered 2019-04-19: 200 mg via INTRAVENOUS

## 2019-04-19 MED ORDER — DEXAMETHASONE SODIUM PHOSPHATE 10 MG/ML IJ SOLN
INTRAMUSCULAR | Status: AC
  Filled 2019-04-19: qty 1

## 2019-04-19 MED ORDER — PROPOFOL 10 MG/ML IV BOLUS
INTRAVENOUS | Status: AC
  Filled 2019-04-19: qty 20

## 2019-04-19 MED ORDER — FENTANYL CITRATE (PF) 100 MCG/2ML IJ SOLN
INTRAMUSCULAR | Status: AC
  Filled 2019-04-19: qty 2

## 2019-04-19 MED ORDER — BUPIVACAINE-EPINEPHRINE 0.25% -1:200000 IJ SOLN
INTRAMUSCULAR | Status: DC | PRN
  Administered 2019-04-19: 20 mL

## 2019-04-19 MED ORDER — ALBUTEROL SULFATE HFA 108 (90 BASE) MCG/ACT IN AERS
INHALATION_SPRAY | RESPIRATORY_TRACT | Status: AC
  Filled 2019-04-19: qty 6.7

## 2019-04-19 MED ORDER — ONDANSETRON HCL 4 MG/2ML IJ SOLN
INTRAMUSCULAR | Status: DC | PRN
  Administered 2019-04-19: 4 mg via INTRAVENOUS

## 2019-04-19 MED ORDER — SODIUM CHLORIDE 0.9 % IV SOLN
INTRAVENOUS | Status: DC | PRN
  Administered 2019-04-19: 17:00:00 2 mL

## 2019-04-19 MED ORDER — SODIUM CHLORIDE 0.9 % IR SOLN
Status: DC | PRN
  Administered 2019-04-19: 1000 mL

## 2019-04-19 MED ORDER — MEPERIDINE HCL 25 MG/ML IJ SOLN: 6.2500 mg | INTRAMUSCULAR | Status: DC | PRN

## 2019-04-19 MED ORDER — BUPIVACAINE-EPINEPHRINE (PF) 0.25% -1:200000 IJ SOLN
INTRAMUSCULAR | Status: AC
  Filled 2019-04-19: qty 30

## 2019-04-19 MED ORDER — MIDAZOLAM HCL 2 MG/2ML IJ SOLN
INTRAMUSCULAR | Status: AC
  Filled 2019-04-19: qty 2

## 2019-04-19 MED ORDER — SODIUM CHLORIDE 0.9 % IV SOLN
INTRAVENOUS | Status: AC
  Administered 2019-04-19 – 2019-04-20 (×2): via INTRAVENOUS

## 2019-04-19 MED ORDER — SUCCINYLCHOLINE CHLORIDE 200 MG/10ML IV SOSY
PREFILLED_SYRINGE | INTRAVENOUS | Status: DC | PRN
  Administered 2019-04-19: 100 mg via INTRAVENOUS

## 2019-04-19 MED ORDER — ROCURONIUM BROMIDE 10 MG/ML (PF) SYRINGE
PREFILLED_SYRINGE | INTRAVENOUS | Status: AC
  Filled 2019-04-19: qty 10

## 2019-04-19 MED ORDER — SODIUM CHLORIDE 0.9 % IV SOLN: INTRAVENOUS | Status: DC | PRN

## 2019-04-19 MED ORDER — LIDOCAINE 2% (20 MG/ML) 5 ML SYRINGE
INTRAMUSCULAR | Status: AC
  Filled 2019-04-19: qty 5

## 2019-04-19 MED ORDER — DEXAMETHASONE SODIUM PHOSPHATE 10 MG/ML IJ SOLN
INTRAMUSCULAR | Status: DC | PRN
  Administered 2019-04-19: 10 mg via INTRAVENOUS

## 2019-04-19 MED ORDER — PROPOFOL 10 MG/ML IV BOLUS
INTRAVENOUS | Status: DC | PRN
  Administered 2019-04-19: 20 mg via INTRAVENOUS
  Administered 2019-04-19: 160 mg via INTRAVENOUS

## 2019-04-19 MED ORDER — UMECLIDINIUM-VILANTEROL 62.5-25 MCG/INH IN AEPB
1.0000 | INHALATION_SPRAY | Freq: Every day | RESPIRATORY_TRACT | Status: DC
  Administered 2019-04-19 – 2019-04-21 (×3): 1 via RESPIRATORY_TRACT
  Filled 2019-04-19: qty 14

## 2019-04-19 MED ORDER — OXYCODONE HCL 5 MG PO TABS: 5.0000 mg | ORAL_TABLET | ORAL | Status: DC | PRN

## 2019-04-19 MED ORDER — FENTANYL CITRATE (PF) 250 MCG/5ML IJ SOLN
INTRAMUSCULAR | Status: AC
  Filled 2019-04-19: qty 5

## 2019-04-19 MED ORDER — ALBUTEROL SULFATE HFA 108 (90 BASE) MCG/ACT IN AERS
INHALATION_SPRAY | RESPIRATORY_TRACT | Status: DC | PRN
  Administered 2019-04-19: 2 via RESPIRATORY_TRACT

## 2019-04-19 MED ORDER — ONDANSETRON HCL 4 MG/2ML IJ SOLN
INTRAMUSCULAR | Status: AC
  Filled 2019-04-19: qty 2

## 2019-04-19 MED ORDER — LIDOCAINE 2% (20 MG/ML) 5 ML SYRINGE
INTRAMUSCULAR | Status: DC | PRN
  Administered 2019-04-19: 100 mg via INTRAVENOUS

## 2019-04-19 MED ORDER — LACTATED RINGERS IV SOLN
INTRAVENOUS | Status: DC | PRN
  Administered 2019-04-19 (×2): via INTRAVENOUS

## 2019-04-19 MED ORDER — ROCURONIUM BROMIDE 10 MG/ML (PF) SYRINGE
PREFILLED_SYRINGE | INTRAVENOUS | Status: DC | PRN
  Administered 2019-04-19: 30 mg via INTRAVENOUS
  Administered 2019-04-19: 10 mg via INTRAVENOUS

## 2019-04-19 MED ORDER — BUPIVACAINE-EPINEPHRINE 0.25% -1:200000 IJ SOLN
INTRAMUSCULAR | Status: AC
  Filled 2019-04-19: qty 1

## 2019-04-19 MED ORDER — MUPIROCIN 2 % EX OINT
1.0000 "application " | TOPICAL_OINTMENT | Freq: Two times a day (BID) | CUTANEOUS | Status: DC
  Administered 2019-04-19 – 2019-04-21 (×5): 1 via NASAL
  Filled 2019-04-19 (×2): qty 22

## 2019-04-19 MED ORDER — FENTANYL CITRATE (PF) 100 MCG/2ML IJ SOLN
INTRAMUSCULAR | Status: DC | PRN
  Administered 2019-04-19 (×4): 50 ug via INTRAVENOUS

## 2019-04-19 MED ORDER — 0.9 % SODIUM CHLORIDE (POUR BTL) OPTIME
TOPICAL | Status: DC | PRN
  Administered 2019-04-19: 16:00:00 1000 mL

## 2019-04-19 MED ORDER — SUCCINYLCHOLINE CHLORIDE 200 MG/10ML IV SOSY
PREFILLED_SYRINGE | INTRAVENOUS | Status: AC
  Filled 2019-04-19: qty 10

## 2019-04-19 MED ORDER — METOCLOPRAMIDE HCL 5 MG/ML IJ SOLN: 10.0000 mg | Freq: Once | INTRAMUSCULAR | Status: DC | PRN

## 2019-04-19 MED ORDER — FENTANYL CITRATE (PF) 100 MCG/2ML IJ SOLN
50.0000 ug | Freq: Once | INTRAMUSCULAR | Status: AC
  Administered 2019-04-19: 50 ug via INTRAVENOUS
  Filled 2019-04-19: qty 2

## 2019-04-19 MED ORDER — MIDAZOLAM HCL 5 MG/5ML IJ SOLN
INTRAMUSCULAR | Status: DC | PRN
  Administered 2019-04-19: 2 mg via INTRAVENOUS

## 2019-04-19 MED ORDER — FENTANYL CITRATE (PF) 100 MCG/2ML IJ SOLN
25.0000 ug | INTRAMUSCULAR | Status: DC | PRN
  Administered 2019-04-19 (×3): 50 ug via INTRAVENOUS

## 2019-04-19 SURGICAL SUPPLY — 36 items
APPLIER CLIP 5 13 M/L LIGAMAX5 (MISCELLANEOUS) ×3
BLADE CLIPPER SURG (BLADE) IMPLANT
CANISTER SUCT 3000ML PPV (MISCELLANEOUS) ×3 IMPLANT
CHLORAPREP W/TINT 26 (MISCELLANEOUS) ×3 IMPLANT
CLIP APPLIE 5 13 M/L LIGAMAX5 (MISCELLANEOUS) ×1 IMPLANT
COVER MAYO STAND STRL (DRAPES) IMPLANT
COVER SURGICAL LIGHT HANDLE (MISCELLANEOUS) ×3 IMPLANT
COVER WAND RF STERILE (DRAPES) ×3 IMPLANT
DERMABOND ADVANCED (GAUZE/BANDAGES/DRESSINGS) ×2
DERMABOND ADVANCED .7 DNX12 (GAUZE/BANDAGES/DRESSINGS) ×1 IMPLANT
DRAPE C-ARM 42X72 X-RAY (DRAPES) IMPLANT
ELECT REM PT RETURN 9FT ADLT (ELECTROSURGICAL) ×3
ELECTRODE REM PT RTRN 9FT ADLT (ELECTROSURGICAL) ×1 IMPLANT
GLOVE SURG SIGNA 7.5 PF LTX (GLOVE) ×3 IMPLANT
GOWN STRL REUS W/ TWL LRG LVL3 (GOWN DISPOSABLE) ×2 IMPLANT
GOWN STRL REUS W/ TWL XL LVL3 (GOWN DISPOSABLE) ×1 IMPLANT
GOWN STRL REUS W/TWL LRG LVL3 (GOWN DISPOSABLE) ×4
GOWN STRL REUS W/TWL XL LVL3 (GOWN DISPOSABLE) ×2
KIT BASIN OR (CUSTOM PROCEDURE TRAY) ×3 IMPLANT
KIT TURNOVER KIT B (KITS) ×3 IMPLANT
NS IRRIG 1000ML POUR BTL (IV SOLUTION) ×3 IMPLANT
PAD ARMBOARD 7.5X6 YLW CONV (MISCELLANEOUS) ×3 IMPLANT
POUCH SPECIMEN RETRIEVAL 10MM (ENDOMECHANICALS) ×3 IMPLANT
SCISSORS LAP 5X35 DISP (ENDOMECHANICALS) ×3 IMPLANT
SET CHOLANGIOGRAPH 5 50 .035 (SET/KITS/TRAYS/PACK) IMPLANT
SET IRRIG TUBING LAPAROSCOPIC (IRRIGATION / IRRIGATOR) ×3 IMPLANT
SET TUBE SMOKE EVAC HIGH FLOW (TUBING) ×3 IMPLANT
SLEEVE ENDOPATH XCEL 5M (ENDOMECHANICALS) ×6 IMPLANT
SPECIMEN JAR SMALL (MISCELLANEOUS) ×3 IMPLANT
SUT MNCRL AB 4-0 PS2 18 (SUTURE) ×3 IMPLANT
TOWEL GREEN STERILE (TOWEL DISPOSABLE) ×3 IMPLANT
TOWEL GREEN STERILE FF (TOWEL DISPOSABLE) ×3 IMPLANT
TRAY LAPAROSCOPIC MC (CUSTOM PROCEDURE TRAY) ×3 IMPLANT
TROCAR XCEL BLUNT TIP 100MML (ENDOMECHANICALS) ×3 IMPLANT
TROCAR XCEL NON-BLD 5MMX100MML (ENDOMECHANICALS) ×3 IMPLANT
WATER STERILE IRR 1000ML POUR (IV SOLUTION) ×3 IMPLANT

## 2019-04-19 NOTE — Progress Notes (Addendum)
Daily Rounding Note  04/19/2019, 10:04 AM  LOS: 2 days   SUBJECTIVE:   Chief complaint: Cholangitis.  Gallbladder sludge.  Right upper quadrant pain, nausea vomiting.    Right upper quadrant pain persists no better controlled with narcotics.  OBJECTIVE:         Vital signs in last 24 hours:    Temp:  [97.4 F (36.3 C)-99.5 F (37.5 C)] 98.1 F (36.7 C) (10/08 0804) Pulse Rate:  [77-91] 79 (10/08 0804) Resp:  [18-22] 18 (10/08 0804) BP: (119-146)/(77-91) 132/78 (10/08 0804) SpO2:  [87 %-99 %] 94 % (10/08 0804) Weight:  [88.7 kg-90.4 kg] 88.7 kg (10/08 0224) Last BM Date: 04/16/19 Filed Weights   04/18/19 0500 04/18/19 1351 04/19/19 0224  Weight: 94.7 kg 90.4 kg 88.7 kg   General: Patient looks well.  Not clearly jaundiced. Heart: RRR. Chest: Clear bilaterally Abdomen: Not tender but he just had pain meds.  Nondistended.  Active bowel sounds. Extremities: No CCE. Neuro/Psych: Appropriate.  Fully alert and oriented x3.  No gross deficits.  Intake/Output from previous day: 10/07 0701 - 10/08 0700 In: 3680.1 [P.O.:770; I.V.:2733.3; IV Piggyback:176.8] Out: 700 [Urine:700]  Intake/Output this shift: Total I/O In: 50 [P.O.:50] Out: 400 [Urine:400]  Lab Results: Recent Labs    04/17/19 1132 04/18/19 0350 04/19/19 0718  WBC 13.2* 7.6 7.8  HGB 17.3* 14.8 16.1  HCT 49.8 44.2 46.4  PLT 284 237 255   BMET Recent Labs    04/17/19 1132 04/18/19 0350 04/19/19 0718  NA 137 137 136  K 3.9 3.7 3.5  CL 100 99 95*  CO2 26 24 28   GLUCOSE 138* 109* 94  BUN 15 8 9   CREATININE 0.89 0.72 0.79  CALCIUM 9.2 8.6* 8.9   LFT Recent Labs    04/17/19 1132 04/18/19 0350 04/19/19 0718  PROT 7.4 6.3* 7.0  ALBUMIN 3.9 3.4* 3.6  AST 519* 205* 79*  ALT 223* 146* 100*  ALKPHOS 280* 243* 222*  BILITOT 5.5* 3.5* 2.1*   PT/INR Recent Labs    04/18/19 0350  LABPROT 13.9  INR 1.1   Hepatitis Panel Recent Labs    04/18/19 0350  HEPBSAG NON REACTIVE  HCVAB NON REACTIVE  HEPAIGM NON REACTIVE  HEPBIGM NON REACTIVE    Studies/Results: Ct Abdomen Pelvis W Contrast  Result Date: 04/17/2019 CLINICAL DATA:  63 year old presenting with acute onset of RIGHT UPPER QUADRANT and RIGHT LOWER QUADRANT abdominal pain and fever. Surgical history includes TURBT and endovascular abdominal aortic aneurysm repair. EXAM: CT ABDOMEN AND PELVIS WITH CONTRAST TECHNIQUE: Multidetector CT imaging of the abdomen and pelvis was performed using the standard protocol following bolus administration of intravenous contrast. CONTRAST:  123mL OMNIPAQUE IOHEXOL 300 MG/ML IV. COMPARISON:  12/26/2017 and earlier. FINDINGS: Lower chest: Visualized lung bases clear. Normal heart size. Hepatobiliary: Geographic areas of hepatic steatosis involving the RIGHT lobe. No hepatic parenchymal masses. Calcified granuloma in the lateral segment LEFT lobe. Mildly distended gallbladder with mucosal enhancement, but no evidence of cholelithiasis. No biliary ductal dilation. Pancreas: Normal in appearance without evidence of mass, ductal dilation, or inflammation. Spleen: Normal in size and appearance. Two small accessory splenules MEDIAL and INFERIOR to the spleen. Adrenals/Urinary Tract: Normal appearing adrenal glands. Benign cyst arising from the LOWER pole the RIGHT kidney. Focal areas of scarring in the UPPER pole RIGHT kidney and mid LEFT kidney. No urinary tract calculi on either side. No hydronephrosis. No solid renal masses. Normal appearing decompressed urinary bladder (  allowing for the metallic beam hardening streak artifact from the LEFT hip prosthesis which partially obscures the bladder). Stomach/Bowel: Stomach normal in appearance for the degree of distention. Large wide-mouth diverticulum arising from the junction of the descending and transverse duodenum, containing gas. Remaining small bowel normal in appearance, though several small bowel loops  are present LATERAL to the descending colon. Diffuse colonic diverticulosis, extensive in the distal descending and sigmoid colon, without evidence of acute diverticulitis. Normal appendix in the RIGHT UPPER pelvis. Vascular/Lymphatic: Prior endovascular abdominal aortic aneurysm repair with aorto bi-iliac stent grafts. The grafts are patent. Mural thrombus is present within the aortic portion of the stent graft. Native aorto-iliofemoral atherosclerosis. Normal-appearing portal venous and systemic venous systems. No pathologic lymphadenopathy. Reproductive: Normal-appearing prostate gland and seminal vesicles for patient age. Other: None. Musculoskeletal: LEFT hip arthroplasty with anatomic alignment. Thoracolumbar dextroscoliosis. Osseous demineralization. Degenerative disc disease and spondylosis at L4-5 and L5-S1. Diffuse facet degenerative changes throughout the lumbar spine. No acute findings. IMPRESSION: 1. Borderline findings of possible acute cholecystitis. Consider RIGHT UPPER QUADRANT abdominal ultrasound to confirm or deny this possibility. 2. No acute abnormalities otherwise involving the abdomen or pelvis. 3. Geographic areas of hepatic steatosis involving the RIGHT lobe. No hepatic parenchymal masses. 4. Large wide-mouth diverticulum arising from the junction of the descending and transverse duodenum. 5. Diffuse colonic diverticulosis without evidence of acute diverticulitis. 6. Prior endovascular abdominal aortic aneurysm repair with aorto bi-iliac stent grafts. The grafts are patent, though mural thrombus is present within the aortic portion of the graft. Aortic Atherosclerosis (ICD10-I70.0). Electronically Signed   By: Evangeline Dakin M.D.   On: 04/17/2019 16:27   Dg Chest Portable 1 View  Result Date: 04/17/2019 CLINICAL DATA:  Cough. EXAM: PORTABLE CHEST 1 VIEW COMPARISON:  11/10/2018 FINDINGS: Stable changes from prior CABG surgery. Cardiac silhouette is normal in size and configuration. No  mediastinal or hilar masses. Lungs are hyperexpanded. Prominent bronchovascular markings most evident in the lower lungs. Minor linear scarring at the right lateral lung base, stable. Lungs are otherwise clear. No pleural effusion or pneumothorax. Skeletal structures are demineralized but grossly intact. IMPRESSION: 1. No acute cardiopulmonary disease. 2. Hyperexpanded lungs suggests COPD. Stable changes from prior CABG surgery. Electronically Signed   By: Lajean Manes M.D.   On: 04/17/2019 14:49   Mr Abdomen Mrcp Moise Boring Contast  Result Date: 04/18/2019 CLINICAL DATA:  Nausea and vomiting over 5 days. Fever. Leukocytosis. EXAM: MRI ABDOMEN WITHOUT AND WITH CONTRAST (INCLUDING MRCP) TECHNIQUE: Multiplanar multisequence MR imaging of the abdomen was performed both before and after the administration of intravenous contrast. Heavily T2-weighted images of the biliary and pancreatic ducts were obtained, and three-dimensional MRCP images were rendered by post processing. CONTRAST:  75mL GADAVIST GADOBUTROL 1 MMOL/ML IV SOLN COMPARISON:  Serve multiple CT scan from 04/17/2019 FINDINGS: Extensive artifact obscuring the abdominal aorta and surrounding regions, attributed to the patient's aorta bi-iliac stent grafts. Despite efforts by the technologist and patient, motion artifact is present on today's exam and could not be eliminated. This reduces exam sensitivity and specificity. Lower chest: Unremarkable Hepatobiliary: Large parts of the liver and biliary tree are obscured by artifact related to the patient's aorta bi-iliac stent graft. No compelling abnormal enhancement along the margins of the intrahepatic biliary tree. The gallbladder appears unremarkable. The extrahepatic biliary tree is obscured by artifact on most sequences, but does not appear dilated on the axial T2 sequences. No obvious focal hepatic lesion is observed. Pancreas:  Unremarkable where visible. Spleen:  Unremarkable Adrenals/Urinary Tract: Right  kidney lower pole cyst is partially included. Much of the kidneys is obscured by metal artifact. Stomach/Bowel: Large duodenal diverticulum extending from the periampullary region through the proximal transverse diverticulum. This could actually represent a cluster of 2 or more diverticula. No compelling evidence of inflammation, much of this process is obscured by metal artifact. Descending and sigmoid colon diverticulosis. Vascular/Lymphatic:  Obscured by aorta bi-iliac stent graft. Other:  No supplemental non-categorized findings. Musculoskeletal: Mild dextroconvex lumbar rotary scoliosis. IMPRESSION: 1. Unfortunately much of the abdomen is obscured by the metal artifact from the patient's aorta bi-iliac stent graft. I do not see definite abnormal biliary dilatation nor are there findings of beading or irregularity along the bowel ducts to further suggest cholangitis, but sensitivity must be understood to be markedly reduced. Motion artifact plates are rule in obscuring findings as well. 2. No current gallbladder wall thickening is observed. 3. Large duodenal diverticulum or cluster of diverticular extending from the periampullary region to the distal transverse duodenum. 4. Descending and sigmoid colon diverticulosis. Electronically Signed   By: Van Clines M.D.   On: 04/18/2019 12:06   US Abdomen Limited Ruq  Result Date: 04/17/2019 CLINICAL DATA:  Right upper quadrant pain for 6 days. EXAM: ULTRASOUND ABDOMEN LIMITED RIGHT UPPER QUADRANT COMPARISON:  CT angiogram of the abdomen and pelvis 12/26/2017. FINDINGS: Gallbladder: There is some sludge within the gallbladder. No gallstones or wall thickening visualized. No sonographic Murphy sign noted by sonographer. Common bile duct: Diameter: 0.5 cm Liver: Echogenicity is increased. No focal lesion. Portal vein is patent on color Doppler imaging with normal direction of blood flow towards the liver. Other: None. IMPRESSION: Gallbladder sludge.  Negative  for stones or cholecystitis. Fatty infiltration of the liver. Electronically Signed   By: Inge Rise M.D.   On: 04/17/2019 13:24   Scheduled Meds:  amLODipine  5 mg Oral Daily   metoprolol tartrate  12.5 mg Oral BID   nicotine  21 mg Transdermal Daily   pantoprazole  40 mg Oral Daily   tamsulosin  0.4 mg Oral QPC supper   Continuous Infusions:  piperacillin-tazobactam (ZOSYN)  IV 3.375 g (04/19/19 0628)   PRN Meds:.acetaminophen, HYDROmorphone (DILAUDID) injection, ipratropium-albuterol, ondansetron **OR** ondansetron (ZOFRAN) IV   ASSESMENT:   *   RUQ pain, nausea/vomiting, elevated LFTs, GB sludge, cholangitis.  Klebsiella growing from blood cultures.  Blood culture PCR panel positive for Klebsiella and Enterobacter. Surgery planning cholecystectomy w IOC once patient cleared by cards for surgery  *     Recurrent Zenker's diverticulum.   S/p Zenker's myotomy 09/2017.  Set for Zenker's repair with Dr. Harl Bowie at [facility] 04/26/2019   PLAN   *   Plan to follow from distance.  Call if GI needed if ERCP indicated.    Azucena Freed  04/19/2019, 10:04 AM Phone [phone]      Attending physician's note   I have taken an interval history, reviewed the chart and examined the patient. I agree with the Advanced Practitioner's note, impression and recommendations.   Symptomatic cholelithiasis with possible ascending cholangitis.  It appears that he has passed the CBD stone.  Liver function tests trending down.  No biliary dil on US/CT/MRCP.  2.5cm recurrent Zenker's diverticulum. High risk for perforation  Has very complex multiple diverticula in the second portion of duodenum on CT/MRCP.  Which would potentially make ERCP very challenging.  Plan: -Lap chole with IOC after cardiology clearance by surgery. -Please alert anesthesia regarding Zenker's diverticulum  as well (prior to lap chole) -Outpatient redo cricopharyngeal myotomy by Dr. Harl Bowie at [facility] later this  month.   Carmell Austria, MD [facility] [phone].

## 2019-04-19 NOTE — Progress Notes (Signed)
Ok to give AM med's with small sips per surgery

## 2019-04-19 NOTE — Progress Notes (Signed)
Patient back from procedure, incisions clean and dry. C/o of pain Verbal order with read back given from MD Lyndel Safe for Fentanyl 50 mcg. Medication given.

## 2019-04-19 NOTE — Consult Note (Signed)
Cardiology Consultation:   Patient ID: Carl Lee MRN: [id number]; DOB: Oct 26, 1955  Admit date: 04/17/2019 Date of Consult: 04/19/2019  Primary Care Provider: [name], MD Primary Cardiologist: [name], MD  Primary Electrophysiologist:  None    Patient Profile:   Carl Lee is a 63 y.o. male with a hx of CAD status post CABG ('15), AAA with endovascular repair 03/2012, hypertension, hyperlipidemia, diabetes, OSA on CPAP recurrent bladder CVA, carotid disease and tobacco use who is being seen today for the evaluation of preop evaluation at the request of Dr Aileen Fass.  History of Present Illness:   Carl Lee is a 63 year old male with past medical history noted above.  Underwent CABG in 2015 with Dr. [name] with LIMA to the LAD, RIMA to OM, SVG to PDA.  Underwent cardiac cath 02/2017 for preop evaluation for right hip surgery noting stable LIMA to the LAD, RIMA to OM and occluded SVG to PDA with native RCA filling via collaterals.  EF at that time noted at 45 to 50%.  Seen by Dr. [name] in the office on 09/2018 with complaints of chest pain intolerant to Imdur.  Placed on Ranexa and capsaicin cream for chest wall pain.  Did not feel at that time he needed an ischemic evaluation.  He has chronic dyspnea felt to be in [place] to asthma/COPD and is followed by pulmonary with Dr. [name].  Notes indicated he needed clearance for Zenker's diverticulum and reported having significant chest pain and shortness of breath with walking upstairs.  Underwent cath 01/2019 with patent LIMA to the LAD, RIMA to OM and occluded SVG to distal RCA which fills via collaterals.  Notes indicate he is struggle with chronic chest pain since his bypass surgery. Dr. Servando Snare referred him to [facility] for surgical evaluation.   He was last seen in the office on 02/21/2019 for follow-up.  Noted no change in his chronic chest pain or dyspnea on exertion.  Stated he had an appointment with Dr. Nancy Nordmann  at [facility] 8/24.  He was continued on his home medications. Reports he was cleared for surgery at [facility] at that is scheduled for 10/15.   Last Thursday he developed right sided upper quad pain with nausea/vomiting. Symptoms worsened with eating. Tried to manage his symptoms at home for several days, but eventually presented to the ED on 04/17/19 when he developed fevere. Found to have acute cholecystitis with acute cholangitis, along with klebsiella bacteremia. Placed on IV antibiotics and surgery planned for lap chole. Cardiology preop evaluation requested.   In talking with the patient, he has had no new cardiac symptoms. Does have intermittent chest wall pain that has been present since his surgery. This is not new. Chronic dyspnea as well. As above recent cath 01/2019 showed stable chronic CAD.   Heart Pathway Score:     Past Medical History:  Diagnosis Date   AAA (abdominal aortic aneurysm) ([place]) 03/25/12   s/p endovascular repair   Adenomatous polyp 06/2009   Atrial tachycardia, paroxysmal ([place]) 06/18/2016   Bladder cancer ([place]) 08/16/2008   recurrence 2015 and 2018 treated with office fulguration   CAD (coronary artery disease), native coronary artery    S/p 3v CABG 01/2014    Cataract 2019   bilateral eyes   Childhood asthma    as a child   Complication of anesthesia    "takes me a year to get over"   Contact dermatitis    atypical [name])   Controlled diabetes mellitus  type 2 with complications ([place]) [id number]   CAD    COPD (chronic obstructive pulmonary disease) (HCC)    Coronary artery disease    a.2015: CABG w/ LIMA-LAD, RIMA-OM, SVG-RCA b. Cath on 07/10/15 w/ patent LIMA-LAD and RIMA-OM. SVG-RCA occluded but collaterals present   Diverticulosis 2013   severe by CT and colonoscopy   Environmental allergies    improved as ages   Fracture of lateral malleolus of left ankle 08/29/2013   Headache    hasn't had migraines in years   Hepatic steatosis 06/2015    by [place]   Hx of migraines    Hypertension    Lactose intolerance 11/14/2018   Endorses for years - avoids dairy products   Lumbosacral radiculopathy at S1 03/2012   left, with spinal stenosis (MRI 04/2013) improved with TF ESI L5/S1 and S1/2 ([place])   OSA (obstructive sleep apnea) 09/04/2014   Severe with AHI 35/hr, uses BIPAP   Overweight (BMI 25.0-29.9) 01/26/2017   Resistance to clopidogrel 2015   drug metabolism panel run - asked to scan   Spinal stenosis    LS1 nerve root impingement from bulging disc   Vitamin B12 deficiency    Zenker diverticulum     Past Surgical History:  Procedure Laterality Date   ABDOMINAL AORTIC ANEURYSM REPAIR  03/25/12   endovascular   ABI  05/2013   WNL, L TBI low at 0.66   Bladder cancer  March 2010 and Oct. 2015   [place] Spell) x 2   CARDIAC CATHETERIZATION N/A 07/10/2015   Procedure: Left Heart Cath and Cors/Grafts Angiography;  Surgeon: [name], MD;  Location: [place] [facility];  Service: Cardiovascular;  Laterality: N/A;   CATARACT EXTRACTION W/ INTRAOCULAR LENS IMPLANT Bilateral 07/2018, 08/2018   COLONOSCOPY  06/2012   hyperplastic polyp, diverticulosis ([place]) rec rpt 5 yrs   COLONOSCOPY WITH PROPOFOL N/A 08/10/2017   TA, rpt 5 yrs Ardis Hughs, Melene Plan, MD)   CORONARY ARTERY BYPASS GRAFT N/A 02/07/2014   Procedure: CORONARY ARTERY BYPASS GRAFTING (CABG);  Surgeon: [name], MD;  Location: [place];  Service: Open Heart Surgery;  Laterality: N/A;  CABG X 3, BILATERAL LIMA, EVH   CYSTOSCOPY  08/16/08   Bladder Cancer   EPIDURAL BLOCK INJECTION Left 09/2014, 10/2014, 12/2014   medial L2,3,4, dorsal L5 ramus blocks x2, L L5/S1 and S1/2 transforaminal ESI ([place])   ESI  04/2013, 06/2013   L L5S1, S12 transforaminal ESI (Dr.  [name])   ESI Left 04/2014, 05/2014, 06/2014   L5/S1, S1/2; rpt; L4/5   ESI  03/2016   R L5/S1 interlaminar ESI   ESOPHAGEAL MANOMETRY N/A 08/10/2017   Procedure:  ESOPHAGEAL MANOMETRY (EM);  Surgeon: [name], MD;  Location: [facility];  Service: Endoscopy;  Laterality: N/A;   ESOPHAGOGASTRODUODENOSCOPY (EGD) WITH PROPOFOL N/A 08/10/2017   Procedure: ESOPHAGOGASTRODUODENOSCOPY (EGD) WITH PROPOFOL;  Surgeon: [name], MD;  Location: [facility];  Service: Endoscopy;  Laterality: N/A;   FULGURATION OF BLADDER TUMOR  01/2017   recurrent 1mm L lateral wall papillary transitional cell carcinoma ([place])   INTRAOPERATIVE TRANSESOPHAGEAL ECHOCARDIOGRAM N/A 02/07/2014   Procedure: INTRAOPERATIVE TRANSESOPHAGEAL ECHOCARDIOGRAM;  Surgeon: [name], MD;  Location: [place];  Service: Open Heart Surgery;  Laterality: N/A;   LEFT HEART CATH AND CORS/GRAFTS ANGIOGRAPHY N/A 02/11/2017   Procedure: LEFT HEART CATH AND CORS/GRAFTS ANGIOGRAPHY;  Surgeon: [name], MD;  Location: [place] [facility];  Service: Cardiovascular;  Laterality: N/A;  LEFT HEART CATH AND CORS/GRAFTS ANGIOGRAPHY N/A 02/02/2019   Procedure: LEFT HEART CATH AND CORS/GRAFTS ANGIOGRAPHY;  Surgeon: Belva Crome, MD;  Location: [place] [facility];  Service: Cardiovascular;  Laterality: N/A;   LEFT HEART CATHETERIZATION WITH CORONARY ANGIOGRAM N/A 02/01/2014   Procedure: LEFT HEART CATHETERIZATION WITH CORONARY ANGIOGRAM;  Surgeon: [name], MD;  Location: [facility];  Service: Cardiovascular;  Laterality: N/A;   PRP epidural injection Left 11/2015   L5/S1, S1/2 transforaminal epidural PRP injections under fluoroscopy ([place])   REPLACEMENT TOTAL HIP W/  RESURFACING IMPLANTS Left    TONSILLECTOMY AND ADENOIDECTOMY  1973   TOTAL HIP ARTHROPLASTY Left 03/08/2017   TOTAL HIP ARTHROPLASTY Left 03/08/2017   Procedure: LEFT TOTAL HIP ARTHROPLASTY ANTERIOR APPROACH;  Surgeon: [name], MD;  Location: [place];  Service: Orthopedics;  Laterality: Left;   VASECTOMY     ZENKER'S DIVERTICULECTOMY  09/2017     Home Medications:  Prior to  Admission medications   Medication Sig Start Date End Date Taking? Authorizing Provider  acetaminophen (TYLENOL) 325 MG tablet Take 2 tablets (650 mg total) by mouth every 6 (six) hours as needed for moderate pain or headache. 07/14/18  Yes [name], MD  albuterol [facility] HFA) [phone] Base) MCG/ACT inhaler Inhale 2 puffs into the lungs every 4 (four) hours as needed for wheezing or shortness of breath. 03/21/19  Yes Magdalen Spatz, NP  amLODipine (NORVASC) 5 MG tablet TAKE 1 TABLET BY MOUTH EVERY DAY Patient taking differently: Take 5 mg by mouth daily.  02/08/19  Yes Josue Hector, MD  aspirin EC 81 MG tablet Take 1 tablet (81 mg total) by mouth daily. 02/21/19  Yes Imogene Burn, PA-C  atorvastatin (LIPITOR) 20 MG tablet TAKE 1 TABLET (20 MG TOTAL) BY MOUTH DAILY AT 6 PM. 08/16/18  Yes [name], MD  calcium carbonate (TUMS - DOSED IN MG ELEMENTAL CALCIUM) 500 MG chewable tablet Chew 2 tablets by mouth daily as needed for indigestion or heartburn.   Yes [provider]  Carboxymethylcellul-Glycerin (LUBRICATING EYE DROPS OP) Place 1 drop into both eyes daily as needed (dry eyes).   Yes [provider]  cholecalciferol (VITAMIN D3) 25 MCG (1000 UT) tablet Take 1,000 Units by mouth daily.   Yes [provider]  Glycopyrrolate-Formoterol (BEVESPI AEROSPHERE) 9-4.8 MCG/ACT AERO Inhale 2 puffs into the lungs 2 (two) times daily. 04/11/19  Yes Martyn Ehrich, NP  Guaifenesin [facility] MAXIMUM STRENGTH) 1200 MG TB12 Take 1 tablet (1,200 mg total) by mouth 2 (two) times daily. 04/11/19  Yes Magdalen Spatz, NP  ketotifen (ZADITOR) 0.025 % ophthalmic solution Place 1 drop into both eyes daily as needed (allergies).   Yes [provider]  metoprolol tartrate (LOPRESSOR) 25 MG tablet TAKE 0.5 TABLETS (12.5 MG TOTAL) BY MOUTH 2 (TWO) TIMES DAILY. 04/02/19  Yes Josue Hector, MD  nystatin (MYCOSTATIN) 100000 UNIT/ML suspension Swish and swallow 7mL four times  daily x5-7days 04/11/19  Yes Magdalen Spatz, NP  oxyCODONE (ROXICODONE) 5 MG immediate release tablet Take 1 tablet (5 mg total) by mouth every 6 (six) hours as needed for severe pain. 02/28/19  Yes [name], MD  Respiratory Therapy Supplies (FLUTTER) DEVI Use as directed 04/11/19  Yes Magdalen Spatz, NP  Simethicone (GAS-X PO) Take 1 tablet by mouth daily as needed (gas).   Yes [provider]  tamsulosin (FLOMAX) 0.4 MG CAPS capsule Take 1 capsule (0.4 mg total) by  mouth daily after supper. 11/08/18  Yes [name], MD  vitamin B-12 (CYANOCOBALAMIN) 500 MCG tablet Take 1 tablet (500 mcg total) by mouth every other day. Patient taking differently: Take 500 mcg by mouth every other day. Took on Monday 12/29/17  Yes [name], MD  famotidine (PEPCID) 20 MG tablet TAKE 1 TABLET BY MOUTH TWICE A DAY 03/22/19   [name], MD  glucose blood (ONE TOUCH ULTRA TEST) test strip CHECK ONCE DAILY AND AS NEEDED 10/21/14   [provider]  ipratropium-albuterol (DUONEB) 0.5-2.5 (3) MG/3ML SOLN TAKE 3 MLS BY NEBULIZATION EVERY 6 (SIX) HOURS AS NEEDED FOR UP TO 30 DAYS. 01/29/19 02/28/19  Fenton Foy, NP  omeprazole (PRILOSEC) 40 MG capsule Take 1 capsule (40 mg total) by mouth daily. Take for 3 weeks daily then as needed Patient not taking: Reported on 04/17/2019 03/22/19   [name], MD    Inpatient Medications: Scheduled Meds:  amLODipine  5 mg Oral Daily   metoprolol tartrate  12.5 mg Oral BID   nicotine  21 mg Transdermal Daily   pantoprazole  40 mg Oral Daily   tamsulosin  0.4 mg Oral QPC supper   Continuous Infusions:  piperacillin-tazobactam (ZOSYN)  IV 3.375 g (04/19/19 0628)   PRN Meds: acetaminophen, HYDROmorphone (DILAUDID) injection, ipratropium-albuterol, ondansetron **OR** ondansetron (ZOFRAN) IV  Allergies:    Allergies  Allergen Reactions   Codeine Other (See Comments)    Nightmares   Doxycycline Hives and Other (See  Comments)    mild    Social History:   Social History   Socioeconomic History   Marital status: Married    Spouse name: Not on file   Number of children: 2   Years of education: Not on file   Highest education level: Not on file  Occupational History   Occupation: Self Employed  Social Designer, fashion/clothing strain: Not on file   Food insecurity    Worry: Not on file    Inability: Not on file   Transportation needs    Medical: Not on file    Non-medical: Not on file  Tobacco Use   Smoking status: Former Smoker    Packs/day: 1.00    Years: 40.00    Pack years: 40.00    Types: Cigarettes    Quit date: 02/05/2014    Years since quitting: 5.2   Smokeless tobacco: Never Used  Substance and Sexual Activity   Alcohol use: Yes    Alcohol/week: 0.0 standard drinks    Comment: 3-4 drinks per month   Drug use: No   Sexual activity: Not Currently  Lifestyle   Physical activity    Days per week: Not on file    Minutes per session: Not on file   Stress: Not on file  Relationships   Social connections    Talks on phone: Not on file    Gets together: Not on file    Attends religious service: Not on file    Active member of club or organization: Not on file    Attends meetings of clubs or organizations: Not on file    Relationship status: Not on file   Intimate partner violence    Fear of current or ex partner: Not on file    Emotionally abused: Not on file    Physically abused: Not on file    Forced sexual activity: Not on file  Other Topics Concern   Not on file  Social History Narrative  Caffeine: 1 cup coffee/day   Lives with wife, 1 dog   grown children   Occupation: Therapist, sports - self employed.  Disability after CABG   Edu: 2 yrs college   Activity: fishing, walking occasionally   Diet: good water, fruits/vegetables daily    Family History:    Family History  Problem Relation Age of Onset   Diabetes Mother    Arrhythmia  Mother        pacemaker   COPD Mother    Thyroid disease Mother    Hyperlipidemia Mother    Hypertension Mother    Bladder Cancer Mother    Diabetes Father    CAD Father 46       CHF, MI   Dementia Father    Heart attack Father    Diabetes Sister    Heart attack Maternal Grandmother    AAA (abdominal aortic aneurysm) Maternal Grandmother    Colon cancer Neg Hx    Stomach cancer Neg Hx      ROS:  Please see the history of present illness.   All other ROS reviewed and negative.     Physical Exam/Data:   Vitals:   04/19/19 0037 04/19/19 0224 04/19/19 0441 04/19/19 0804  BP: 119/77  (!) 129/91 132/78  Pulse: 77  88 79  Resp: 18  18 18   Temp: 98.4 F (36.9 C)  (!) 97.4 F (36.3 C) 98.1 F (36.7 C)  TempSrc: Oral  Oral Oral  SpO2: 93%  93% 94%  Weight:  88.7 kg    Height:        Intake/Output Summary (Last 24 hours) at 04/19/2019 1039 Last data filed at 04/19/2019 0934 Gross per 24 hour  Intake 2050.97 ml  Output 1100 ml  Net 950.97 ml   Last 3 Weights 04/19/2019 04/18/2019 04/18/2019  Weight (lbs) 195 lb 8 oz 199 lb 3.2 oz 208 lb 12.8 oz  Weight (kg) 88.678 kg 90.357 kg 94.711 kg     Body mass index is 28.05 kg/m.  General:  Well nourished, well developed, in no acute distress HEENT: normal Neck: no JVD Endocrine:  No thryomegaly Vascular: No carotid bruits Cardiac:  normal S1, S2; RRR; no murmur  Lungs:  clear to auscultation bilaterally, no wheezing, rhonchi or rales  Abd: soft, right UQ tenderness, no hepatomegaly  Ext: no edema Musculoskeletal:  No deformities, BUE and BLE strength normal and equal Skin: warm and dry  Neuro:  CNs 2-12 intact, no focal abnormalities noted Psych:  Normal affect   EKG:  The EKG was personally reviewed and demonstrates:  SR 91bpm nonspecific ST/TW changes  Relevant CV Studies:  Cath: 01/2019    Origin to Prox Graft lesion is 100% stenosed.    Patent left internal mammary to LAD  Patent right internal  mammary to second obtuse marginal  Totally occluded saphenous vein graft to the distal RCA.  Total occlusion of proximal LAD native vessel.  Aneurysm involvement of the proximal circumflex with patent obtuse marginal branches.  Total occlusion of the proximal RCA.  Distal RCA fills by collaterals from first septal perforator.  Mild mid to distal anterior wall hypokinesis.  EF 45 to 50%.  Normal LV hemodynamics.  Overall, when compared to 2 years ago there is no change in anatomy.  Patient has had chronic chest pain since bypass surgery.  Current symptoms are no different than what he has had since bypass surgery.  RECOMMENDATIONS:   As noted above, anatomy is stable.  Chest pain  symptoms are chronic and non-ischemic or stable ischemic.  Cleared for upcoming surgery from coronary artery disease and left ventricular function standpoint.  Diagnostic Dominance: Right   TTE: 01/31/2019  IMPRESSIONS    1. Severe hypokinesis of the left ventricular, apical segments of the inferior, anteroseptal and inferoseptal walls, consistent with LAD territory infarction.  2. The left ventricle has normal systolic function with an ejection fraction of 60-65%. The cavity size was normal. Left ventricular diastolic parameters were normal. No evidence of left ventricular regional wall motion abnormalities.  3. The right ventricle has normal systolic function. The cavity was normal. There is no increase in right ventricular wall thickness.  4. The aorta is normal in size and structure.  5. No intracardiac thrombi or masses were visualized.   Laboratory Data:  High Sensitivity Troponin:   Recent Labs  Lab 04/17/19 1233 04/17/19 1434  TROPONINIHS 7 8     Chemistry Recent Labs  Lab 04/17/19 1132 04/18/19 0350 04/19/19 0718  NA 137 137 136  K 3.9 3.7 3.5  CL 100 99 95*  CO2 26 24 28   GLUCOSE 138* 109* 94  BUN 15 8 9   CREATININE 0.89 0.72 0.79  CALCIUM 9.2 8.6* 8.9  GFRNONAA  >60 >60 >60  GFRAA >60 >60 >60  ANIONGAP 11 14 13     Recent Labs  Lab 04/17/19 1132 04/18/19 0350 04/19/19 0718  PROT 7.4 6.3* 7.0  ALBUMIN 3.9 3.4* 3.6  AST 519* 205* 79*  ALT 223* 146* 100*  ALKPHOS 280* 243* 222*  BILITOT 5.5* 3.5* 2.1*   Hematology Recent Labs  Lab 04/17/19 1132 04/18/19 0350 04/19/19 0718  WBC 13.2* 7.6 7.8  RBC 5.30 4.58 4.98  HGB 17.3* 14.8 16.1  HCT 49.8 44.2 46.4  MCV 94.0 96.5 93.2  MCH 32.6 32.3 32.3  MCHC 34.7 33.5 34.7  RDW 13.3 13.6 13.1  PLT 284 237 255   BNPNo results for input(s): BNP, PROBNP in the last 168 hours.  DDimer No results for input(s): DDIMER in the last 168 hours.   Radiology/Studies:  Ct Abdomen Pelvis W Contrast  Result Date: 04/17/2019 CLINICAL DATA:  63 year old presenting with acute onset of RIGHT UPPER QUADRANT and RIGHT LOWER QUADRANT abdominal pain and fever. Surgical history includes TURBT and endovascular abdominal aortic aneurysm repair. EXAM: CT ABDOMEN AND PELVIS WITH CONTRAST TECHNIQUE: Multidetector CT imaging of the abdomen and pelvis was performed using the standard protocol following bolus administration of intravenous contrast. CONTRAST:  1109mL OMNIPAQUE IOHEXOL 300 MG/ML IV. COMPARISON:  12/26/2017 and earlier. FINDINGS: Lower chest: Visualized lung bases clear. Normal heart size. Hepatobiliary: Geographic areas of hepatic steatosis involving the RIGHT lobe. No hepatic parenchymal masses. Calcified granuloma in the lateral segment LEFT lobe. Mildly distended gallbladder with mucosal enhancement, but no evidence of cholelithiasis. No biliary ductal dilation. Pancreas: Normal in appearance without evidence of mass, ductal dilation, or inflammation. Spleen: Normal in size and appearance. Two small accessory splenules MEDIAL and INFERIOR to the spleen. Adrenals/Urinary Tract: Normal appearing adrenal glands. Benign cyst arising from the LOWER pole the RIGHT kidney. Focal areas of scarring in the UPPER pole RIGHT  kidney and mid LEFT kidney. No urinary tract calculi on either side. No hydronephrosis. No solid renal masses. Normal appearing decompressed urinary bladder (allowing for the metallic beam hardening streak artifact from the LEFT hip prosthesis which partially obscures the bladder). Stomach/Bowel: Stomach normal in appearance for the degree of distention. Large wide-mouth diverticulum arising from the junction of the descending and transverse duodenum,  containing gas. Remaining small bowel normal in appearance, though several small bowel loops are present LATERAL to the descending colon. Diffuse colonic diverticulosis, extensive in the distal descending and sigmoid colon, without evidence of acute diverticulitis. Normal appendix in the RIGHT UPPER pelvis. Vascular/Lymphatic: Prior endovascular abdominal aortic aneurysm repair with aorto bi-iliac stent grafts. The grafts are patent. Mural thrombus is present within the aortic portion of the stent graft. Native aorto-iliofemoral atherosclerosis. Normal-appearing portal venous and systemic venous systems. No pathologic lymphadenopathy. Reproductive: Normal-appearing prostate gland and seminal vesicles for patient age. Other: None. Musculoskeletal: LEFT hip arthroplasty with anatomic alignment. Thoracolumbar dextroscoliosis. Osseous demineralization. Degenerative disc disease and spondylosis at L4-5 and L5-S1. Diffuse facet degenerative changes throughout the lumbar spine. No acute findings. IMPRESSION: 1. Borderline findings of possible acute cholecystitis. Consider RIGHT UPPER QUADRANT abdominal ultrasound to confirm or deny this possibility. 2. No acute abnormalities otherwise involving the abdomen or pelvis. 3. Geographic areas of hepatic steatosis involving the RIGHT lobe. No hepatic parenchymal masses. 4. Large wide-mouth diverticulum arising from the junction of the descending and transverse duodenum. 5. Diffuse colonic diverticulosis without evidence of acute  diverticulitis. 6. Prior endovascular abdominal aortic aneurysm repair with aorto bi-iliac stent grafts. The grafts are patent, though mural thrombus is present within the aortic portion of the graft. Aortic Atherosclerosis (ICD10-I70.0). Electronically Signed   By: Evangeline Dakin M.D.   On: 04/17/2019 16:27   Dg Chest Portable 1 View  Result Date: 04/17/2019 CLINICAL DATA:  Cough. EXAM: PORTABLE CHEST 1 VIEW COMPARISON:  11/10/2018 FINDINGS: Stable changes from prior CABG surgery. Cardiac silhouette is normal in size and configuration. No mediastinal or hilar masses. Lungs are hyperexpanded. Prominent bronchovascular markings most evident in the lower lungs. Minor linear scarring at the right lateral lung base, stable. Lungs are otherwise clear. No pleural effusion or pneumothorax. Skeletal structures are demineralized but grossly intact. IMPRESSION: 1. No acute cardiopulmonary disease. 2. Hyperexpanded lungs suggests COPD. Stable changes from prior CABG surgery. Electronically Signed   By: Lajean Manes M.D.   On: 04/17/2019 14:49   Mr Abdomen Mrcp Moise Boring Contast  Result Date: 04/18/2019 CLINICAL DATA:  Nausea and vomiting over 5 days. Fever. Leukocytosis. EXAM: MRI ABDOMEN WITHOUT AND WITH CONTRAST (INCLUDING MRCP) TECHNIQUE: Multiplanar multisequence MR imaging of the abdomen was performed both before and after the administration of intravenous contrast. Heavily T2-weighted images of the biliary and pancreatic ducts were obtained, and three-dimensional MRCP images were rendered by post processing. CONTRAST:  90mL GADAVIST GADOBUTROL 1 MMOL/ML IV SOLN COMPARISON:  Serve multiple CT scan from 04/17/2019 FINDINGS: Extensive artifact obscuring the abdominal aorta and surrounding regions, attributed to the patient's aorta bi-iliac stent grafts. Despite efforts by the technologist and patient, motion artifact is present on today's exam and could not be eliminated. This reduces exam sensitivity and specificity.  Lower chest: Unremarkable Hepatobiliary: Large parts of the liver and biliary tree are obscured by artifact related to the patient's aorta bi-iliac stent graft. No compelling abnormal enhancement along the margins of the intrahepatic biliary tree. The gallbladder appears unremarkable. The extrahepatic biliary tree is obscured by artifact on most sequences, but does not appear dilated on the axial T2 sequences. No obvious focal hepatic lesion is observed. Pancreas:  Unremarkable where visible. Spleen:  Unremarkable Adrenals/Urinary Tract: Right kidney lower pole cyst is partially included. Much of the kidneys is obscured by metal artifact. Stomach/Bowel: Large duodenal diverticulum extending from the periampullary region through the proximal transverse diverticulum. This could actually represent a cluster of  2 or more diverticula. No compelling evidence of inflammation, much of this process is obscured by metal artifact. Descending and sigmoid colon diverticulosis. Vascular/Lymphatic:  Obscured by aorta bi-iliac stent graft. Other:  No supplemental non-categorized findings. Musculoskeletal: Mild dextroconvex lumbar rotary scoliosis. IMPRESSION: 1. Unfortunately much of the abdomen is obscured by the metal artifact from the patient's aorta bi-iliac stent graft. I do not see definite abnormal biliary dilatation nor are there findings of beading or irregularity along the bowel ducts to further suggest cholangitis, but sensitivity must be understood to be markedly reduced. Motion artifact plates are rule in obscuring findings as well. 2. No current gallbladder wall thickening is observed. 3. Large duodenal diverticulum or cluster of diverticular extending from the periampullary region to the distal transverse duodenum. 4. Descending and sigmoid colon diverticulosis. Electronically Signed   By: Van Clines M.D.   On: 04/18/2019 12:06   US Abdomen Limited Ruq  Result Date: 04/17/2019 CLINICAL DATA:  Right upper  quadrant pain for 6 days. EXAM: ULTRASOUND ABDOMEN LIMITED RIGHT UPPER QUADRANT COMPARISON:  CT angiogram of the abdomen and pelvis 12/26/2017. FINDINGS: Gallbladder: There is some sludge within the gallbladder. No gallstones or wall thickening visualized. No sonographic Murphy sign noted by sonographer. Common bile duct: Diameter: 0.5 cm Liver: Echogenicity is increased. No focal lesion. Portal vein is patent on color Doppler imaging with normal direction of blood flow towards the liver. Other: None. IMPRESSION: Gallbladder sludge.  Negative for stones or cholecystitis. Fatty infiltration of the liver. Electronically Signed   By: Inge Rise M.D.   On: 04/17/2019 13:24    Assessment and Plan:   MULFORD CONLY is a 63 y.o. male with a hx of CAD status post CABG ('15), AAA with endovascular repair 03/2012, hypertension, hyperlipidemia, diabetes, OSA on CPAP recurrent bladder CVA, carotid disease and tobacco use who is being seen today for the evaluation of preop evaluation at the request of Dr Aileen Fass.  1. Preop evaluation: known hx of CAD s/p CABG with 2/3 patent grafts. Most recent cath in 01/2019 with stable chronic disease for preop eval related to Zenker's diverticulum. No change in symptoms since that time. EKG without change on admission. He reports continued intermittent chest wall pain present since his CABG, unchanged. Would not anticipate any further cardiac work up prior to surgery.   2. CAD s/p CABG ('15): ASA prior to admission, with statin, BB and amlodipine  3. Acute cholecystitis with acute cholangitis: general surgery following with plans for lap chole  4. Klebsiella Bacteremia: antibiotics per primary, planned for 2 weeks  5. Zenker's diverticulum: Has been in the process of scheduling surgery with [facility] for about a year. Planned for 10/15, unfortunately I suspect he will need to reschedule this and informed him of such.  For questions or updates, please contact [place] Please consult [email or web address] for contact info under     Signed, Reino Bellis, NP  04/19/2019 10:39 AM

## 2019-04-19 NOTE — Progress Notes (Signed)
  Discussed findings with Dr. [name].   Cholangiogram showing 2 filling defects highly suggestive of CBD stones.  Have explained ERCP including the risks and benefits.  The risks including pancreatitis, bleeding, perforation, risks with anesthesia.  Specifically we also discussed risks with Zenker's diverticulum of inadvertent esophageal perforation.  We also discussed how to minimize the risk.  I also discussed regarding patient having periampullary diverticulum which may make ERCP technically harder.  Cholangiogram films were shown to the patient and patient's wife.   They would like to proceed here.   Plan: ERCP in AM. I have also discussed with Dr. Rush Landmark who is also reviewed Intra-Op cholangiogram films and recommends to proceed.    Dr Lyndel Safe

## 2019-04-19 NOTE — Progress Notes (Signed)
Patient stated he takes inhaler daily for COPD. Wife provided me with name and dose. Paged MD , verbal order given for new medication to be placed. Order placed.

## 2019-04-19 NOTE — Transfer of Care (Signed)
Immediate Anesthesia Transfer of Care Note  Patient: Carl Lee  Procedure(s) Performed: LAPAROSCOPIC CHOLECYSTECTOMY (N/A Abdomen) Intraoperative Cholangiogram (N/A Abdomen)  Patient Location: PACU  Anesthesia Type:General  Level of Consciousness: drowsy and patient cooperative  Airway & Oxygen Therapy: Patient Spontanous Breathing and Patient connected to nasal cannula oxygen  Post-op Assessment: Report given to RN, Post -op Vital signs reviewed and stable and Patient moving all extremities  Post vital signs: Reviewed and stable  Last Vitals:  Vitals Value Taken Time  BP 134/86 04/19/19 1705  Temp    Pulse 100 04/19/19 1705  Resp 19 04/19/19 1705  SpO2 94 % 04/19/19 1705  Vitals shown include unvalidated device data.  Last Pain:  Vitals:   04/19/19 1139  TempSrc: Oral  PainSc:       Patients Stated Pain Goal: 0 ([id number])  Complications: No apparent anesthesia complications

## 2019-04-19 NOTE — Telephone Encounter (Signed)
Called and spoke w/ pt to inquire if his Duoneb medication is expired. Pt states his Duoneb medication is not expired. Pt further notes he is currently admitted as of 04/17/2019 for what he states as abdominal/gallbladder concerns. Pt states he has a few FYIs to [employer]:  1) Pt states he trialed Bevespi per [facility] and that he felt it was working for him; however, they have taken him off of this in the hospital.   2) Pt has a surgery coming up 04/26/2019 from gastroenterology. He inquired if he would need surgical clearance for this, which I replied that he would need to talk to his surgeon about as this is typically communicated prior to surgery. Pt expressed understanding.    Routing to [employer] as an Pharmacist, hospital. Nothing further needed at this time.

## 2019-04-19 NOTE — Telephone Encounter (Signed)
That sounds good, thank you.

## 2019-04-19 NOTE — Anesthesia Preprocedure Evaluation (Addendum)
Anesthesia Evaluation  Patient identified by MRN, date of birth, ID band Patient awake    Reviewed: Allergy & Precautions, NPO status , Patient's Chart, lab work & pertinent test results  Airway Mallampati: III  TM Distance: >3 FB Neck ROM: Full    Dental no notable dental hx. (+) Teeth Intact, Dental Advisory Given   Pulmonary asthma , sleep apnea , COPD,  COPD inhaler, former smoker,    Pulmonary exam normal breath sounds clear to auscultation       Cardiovascular hypertension, Pt. on medications and Pt. on home beta blockers + CAD and + CABG  Normal cardiovascular exam Rhythm:Regular Rate:Normal  ECG: NSR, rate 91  CATH: Patent left internal mammary to LAD Patent right internal mammary to second obtuse marginal Totally occluded saphenous vein graft to the distal RCA. Total occlusion of proximal LAD native vessel. Aneurysm involvement of the proximal circumflex with patent obtuse marginal branches. Total occlusion of the proximal RCA. Distal RCA fills by collaterals from first septal perforator. Mild mid to distal anterior wall hypokinesis. EF 45 to 50%. Normal LV hemodynamics. Overall, when compared to 2 years ago there is no change in anatomy. Patient has had chronic chest pain since bypass surgery. Current symptoms are no different than what he has had since bypass surgery.  ECHO: 1. Severe hypokinesis of the left ventricular, apical segments of the inferior, anteroseptal and inferoseptal walls, consistent with LAD territory infarction. 2. The left ventricle has normal systolic function with an ejection fraction of 60-65%. The cavity size was normal. Left ventricular diastolic parameters were normal. No evidence of left ventricular regional wall motion abnormalities. 3. The right ventricle has normal systolic function. The cavity was normal. There is no increase in right ventricular wall thickness. 4. The aorta is normal  in size and structure. 5. No intracardiac thrombi or masses were visualized.   Neuro/Psych  Headaches, negative neurological ROS  negative psych ROS   GI/Hepatic Neg liver ROS, GERD  Medicated,Zenker diverticulum   Endo/Other  diabetes  Renal/GU negative Renal ROS  negative genitourinary   Musculoskeletal  (+) Arthritis , Lumbosacral radiculopathy at S1   Abdominal   Peds negative pediatric ROS (+)  Hematology negative hematology ROS (+) HLD   Anesthesia Other Findings cholecystitis  Reproductive/Obstetrics negative OB ROS                         Anesthesia Physical Anesthesia Plan  ASA: III  Anesthesia Plan: General   Post-op Pain Management:    Induction: Intravenous and Rapid sequence  PONV Risk Score and Plan: 3 and Midazolam, Dexamethasone, Ondansetron and Treatment may vary due to age or medical condition  Airway Management Planned: Oral ETT  Additional Equipment:   Intra-op Plan:   Post-operative Plan: Extubation in OR  Informed Consent: I have reviewed the patients History and Physical, chart, labs and discussed the procedure including the risks, benefits and alternatives for the proposed anesthesia with the patient or authorized representative who has indicated his/her understanding and acceptance.     Dental advisory given  Plan Discussed with:   Anesthesia Plan Comments:       Anesthesia Quick Evaluation

## 2019-04-19 NOTE — Op Note (Signed)
LAPAROSCOPIC CHOLECYSTECTOMY, Intraoperative Cholangiogram  Procedure Note  KERRIE MURIE 04/19/2019   Pre-op Diagnosis: cholangitis with cholecystitis     Post-op Diagnosis: same with choledolcholithiasis  Procedure(s): LAPAROSCOPIC CHOLECYSTECTOMY Intraoperative Cholangiogram  Surgeon(s): [name], MD  Anesthesia: General  Staff:  Circulator: Hal Morales, RN Radiology Technologist: Sheliah Hatch, RT Scrub Person: Rosanne Sack, RN; Rometta Emery, RN  Estimated Blood Loss: Minimal               Specimens: sent to path  Indications: This is a 63 year old gentleman who presented with cholangitis and cholecystitis.  Ultrasound and CAT scan were negative for gallstones.  An MRCP was inconclusive secondary to artifact.  His liver function tests were improving so decision was made to proceed to the operating room for a laparoscopic cholecystectomy and cholangiogram  Findings: The patient was found to have an inflamed gallbladder.  Cholangiogram showed what appeared to be a gallstone floating in the distal common bile duct.  Contrast went easily passed that and into the duodenum.  Procedure: Patient brought to operating identifies correct patient.  He was placed upon the operating table and general anesthesia was induced.  His abdomen was then prepped and draped in the usual sterile fashion.  I made a small incision above the umbilicus with a scalpel.  I took this down to the fascia which was then opened with a scalpel.  Hemostat was then used to pass into the peritoneal cavity under direct vision.  A 0 Vicryl pursing suture was placed around the fascial opening.  The [facility] port was placed the opening and insufflation of the abdomen was begun.  A 5 mm trocar was then placed the patient epigastrium and 2 other in the right upper quadrant all under direct vision.  The gallbladder was found to be inflamed.  I grasped and retracted the liver bed and then took several adhesions  down from the gallbladder.  The cystic artery was anterior.  I clipped approximately distally and transected it.  I then identified the cystic duct and achieved a critical end around it.  I clipped it once distally and opened with a laparoscopic scissors.  I then placed a cholangiocatheter through a small incision in the right upper quadrant under direct vision.  Clancy catheter is passed into the opening the cystic duct.  I then performed a cholangiogram with contrast under fluoroscopy.  This showed the proximal and distal bile duct.  There appeared to be a stone floating in the distal common bile duct with contrast easily going into the duodenum.  The cholangiocatheter was then removed.  I clipped the cystic duct 4 times proximally and the completely transected it.  The gallbladder was then slowly dissected free from the liver bed with electrocautery.  Once it was free from the liver bed was placed in an Endosac and removed through the incision at the umbilicus.  0 Vicryl the umbilicus was tied in place closing the fascial defect.  I then irrigated the right upper quadrant with normal saline.  Again hemostasis appeared to be achieved.  All ports were then removed under direct vision and the abdomen was deflated.  All incisions were then anesthetized with Marcaine and closed with 4-0 Monocryl sutures and Dermabond.  The patient tolerated the procedure well.  All counts were correct at the end of the procedure.  The patient was then extubated in the operating room and taken in a stable condition to the recovery room.  [name]   Date: 04/19/2019  Time: 4:57 PM

## 2019-04-19 NOTE — Anesthesia Procedure Notes (Addendum)
Procedure Name: Intubation Date/Time: 04/19/2019 3:53 PM Performed by: Jenne Campus, CRNA Pre-anesthesia Checklist: Patient identified, Emergency Drugs available, Suction available and Patient being monitored Patient Re-evaluated:Patient Re-evaluated prior to induction Oxygen Delivery Method: Circle System Utilized Preoxygenation: Pre-oxygenation with 100% oxygen Induction Type: IV induction, Cricoid Pressure applied and Rapid sequence Laryngoscope Size: Miller and 3 Grade View: Grade I Tube type: Oral Tube size: 7.5 mm Number of attempts: 1 Airway Equipment and Method: Stylet and Oral airway Placement Confirmation: ETT inserted through vocal cords under direct vision,  positive ETCO2 and breath sounds checked- equal and bilateral Secured at: 22 cm Tube secured with: Tape Dental Injury: Teeth and Oropharynx as per pre-operative assessment

## 2019-04-19 NOTE — Progress Notes (Signed)
Patient ID: Carl Lee, male   DOB: [birth year], 63 y.o.   MRN: [id number]   Cardiology has cleared for surgery. Will proceed with a lap chole and cholangiogram. I discussed the procedure in detail.   We discussed the risks and benefits of a laparoscopic cholecystectomy and cholangiogram including, but not limited to bleeding, infection, injury to surrounding structures such as the intestine or liver, bile leak, retained gallstones, need to convert to an open procedure, prolonged diarrhea, blood clots such as  DVT, cardiopulmonary issues, common bile duct injury, anesthesia risks, and possible need for additional procedures.  He agrees to proceed.

## 2019-04-19 NOTE — Progress Notes (Signed)
CC: Abdominal pain right upper quadrant, nausea and vomiting x5 days.  Subjective: He says he still feels bad, still has pain in RUQ, No fever, he is still on O2.  Pain is better but he says he is on pain pills.    Objective: Vital signs in last 24 hours: Temp:  [97.4 F (36.3 C)-99.5 F (37.5 C)] 97.4 F (36.3 C) (10/08 0441) Pulse Rate:  [77-91] 88 (10/08 0441) Resp:  [17-22] 18 (10/08 0441) BP: (119-146)/(77-113) 129/91 (10/08 0441) SpO2:  [87 %-99 %] 93 % (10/08 0441) Weight:  [88.7 kg-90.4 kg] 88.7 kg (10/08 0224) Last BM Date: 04/16/19 770 p.o. 2900 IV 700 urine recorded Afebrile vital signs are stable. Off nasal cannula and on room air A.m. labs pending MRCP inconclusive secondary to artifact from the aortobiiliac graft no definite abnormal biliary dilatation, beading or irregularity along the bile ducts to suggest cholangitis, no current gallbladder wall thickening, large duodenal diverticulum or cluster of diverticuli extending from the peri-ampullary region to the distal transverse duodenum.  Descending and sigmoid colon diverticulosis.  Intake/Output from previous day: 10/07 0701 - 10/08 0700 In: 3680.1 [P.O.:770; I.V.:2733.3; IV Piggyback:176.8] Out: 700 [Urine:700] Intake/Output this shift: No intake/output data recorded.  General appearance: alert, cooperative and no distress Resp: clear to auscultation bilaterally GI: soft, generalized tenderness, but mostly RUQ.  + BS, no distension or peritonitis  Lab Results:  Recent Labs    04/18/19 0350 04/19/19 0718  WBC 7.6 7.8  HGB 14.8 16.1  HCT 44.2 46.4  PLT 237 255    BMET Recent Labs    04/17/19 1132 04/18/19 0350  NA 137 137  K 3.9 3.7  CL 100 99  CO2 26 24  GLUCOSE 138* 109*  BUN 15 8  CREATININE 0.89 0.72  CALCIUM 9.2 8.6*   PT/INR Recent Labs    04/18/19 0350  LABPROT 13.9  INR 1.1    Recent Labs  Lab 04/17/19 1132 04/18/19 0350  AST 519* 205*  ALT 223* 146*  ALKPHOS  280* 243*  BILITOT 5.5* 3.5*  PROT 7.4 6.3*  ALBUMIN 3.9 3.4*     Lipase     Component Value Date/Time   LIPASE 17 04/17/2019 1132     Medications: . amLODipine  5 mg Oral Daily  . metoprolol tartrate  12.5 mg Oral BID  . nicotine  21 mg Transdermal Daily  . pantoprazole  40 mg Oral Daily  . tamsulosin  0.4 mg Oral QPC supper    Assessment/Plan Hypertension Prediabetes COPD/45-pack-year history tobacco/quit 2015 Hx bladder cancer Hx CAD status post CABG 2015, Hx PAT Hx abdominal aortic aneurysm repair with endograft Sleep apnea on BiPAP at home Remote history of migraines none since 2015 Chronic back pain Zenker's diverticulum Left hip replacement   Abdominal pain, nausea and vomiting x5 days  - positive blood culture Enterobacteriaceae, and Klebsiella Pneumoniae Gallstones noted on CT of the chest obtained 11/13/2018  - Elevated LFTs/fatty infiltration liver  - Ultrasound negative for stones/positive for sludge/negative for cholecystitis/CBD 0.5 cm  -CT scan: Mild distended gallbladder/diffuse colonic diverticulosis without acute diverticulitis/[place] diverticulum at the junction of the descending and transverse duodenum  -Hx diverticulosis/Zenker's diverticulum   FEN: IV fluids/n.p.o. ID: Zosyn 10/6 >> day 2 DVT: SCDs Follow-up: TBD  Plan:  I called cardiology and ask for cardiac clearance.  Will get GI/Medicine input but I believe he will need a cholecystectomy.  He is NPO, I did tell the nurse he could have AM Meds with  sips.  AM CMP is still pending.      LOS: 2 days    Carl Lee 04/19/2019 [phone]

## 2019-04-19 NOTE — Plan of Care (Signed)
  Problem: Clinical Measurements: Goal: Will remain free from infection Outcome: Progressing   Problem: Activity: Goal: Risk for activity intolerance will decrease Outcome: Progressing   Problem: Coping: Goal: Level of anxiety will decrease Outcome: Progressing   Problem: Safety: Goal: Ability to remain free from injury will improve Outcome: Progressing   

## 2019-04-19 NOTE — Progress Notes (Signed)
[facility] PROGRESS NOTE    Progress Note  Carl MARDIS  [id number] DOB: 21-May-1956 DOA: 04/17/2019 PCP: [name], MD     Brief Narrative:   Carl Lee is an 63 y.o. male with past medical history significant for essential hypertension, prediabetes COPD bladder cancer coronary artery disease status post CABG and a AAA repair comes into the emergency room for right upper quadrant pain nausea and vomiting since 5 days prior to admission.  He is also been having fever nausea and vomiting and a dark urine leukocytosis was 13 right upper quadrant ultrasound show no acute finding CT scan of the abdomen and pelvis showed possible acute cholecystitis. Started on IV fluids IV Dilaudid and IV Zosyn General surgery has been consulted.  Assessment/Plan:   Acute cholecystitis/acute cholangitis: CT Scan of the abdomen and pelvis on 04/18/2019 showed calcified granuloma, mildly distended gallbladder with mucosal enhancement no evidence of cholecystitis no biliary dilation. CT scan of the chest done in May 2020 showed cholelithiasis. Surgery has been consulted and recommended a lap chole. Surgery has requested cardiology evaluation for preop clearance. Patient is currently n.p.o. we will start on normal saline for 24 hours.  Klebsiella bacteremia: Likely due to acute cholangitis Started on IV Zosyn, blood culture sensitivities and speciation's are pending. Will need antibiotic for 2 weeks.  Essential  Hypertension Continue metoprolol amlodipine blood pressure seems to be stable.  Hyperlipidemia: Hold statin.  Coronary artery disease status post CABG: Hold aspirin continue statin and metoprolol. Cardiac biomarkers have been negative, EKG showed no signs of ischemia. Twelve-lead EKG showed normal sinus rhythm normal axis nonspecific T wave changes.  COPD: Patient is currently on 2 L of oxygen nasal cannula continuous. Seems to be stable continue duo nebs.  Abdominal  aortic aneurysm status post repair Aware.  Sleep apnea: Continue CPAP.  History of bladder cancer Noted.  Hepatic steatosis: Noted.  Holding statins.  DVT prophylaxis: lovenox Family Communication:none Disposition Plan/Barrier to D/C: unable to determine Code Status:     Code Status Orders  (From admission, onward)         Start     Ordered   04/17/19 1756  Full code  Continuous     04/17/19 1758        Code Status History    Date Active Date Inactive Code Status Order ID Comments User Context   02/02/2019 0950 02/02/2019 1454 Full Code [id number]  Belva Crome, MD Inpatient   03/08/2017 1952 03/11/2017 1531 Full Code [id number]  [name], MD Inpatient   02/11/2017 1550 02/11/2017 2249 Full Code [id number]  [name], MD Inpatient   07/10/2015 1712 07/11/2015 1326 Full Code [id number]  [name], MD Inpatient   07/10/2015 1711 07/10/2015 1712 Full Code [id number]  [name], MD Inpatient   02/07/2014 1331 02/11/2014 1206 Full Code [id number]  John Giovanni, PA-C Inpatient   02/01/2014 0829 02/01/2014 1607 Full Code [id number]  [name], MD Inpatient   03/25/2012 1351 03/26/2012 1728 Full Code [id number]  [name], RN Inpatient   03/24/2012 2135 03/25/2012 1351 Full Code [id number]  Mechele Claude, RN Inpatient   Advance Care Planning Activity        IV Access:    Peripheral IV   Procedures and diagnostic studies:   Ct Abdomen Pelvis W Contrast  Result Date: 04/17/2019 CLINICAL DATA:  63 year old presenting with acute onset of RIGHT UPPER QUADRANT and RIGHT LOWER QUADRANT abdominal pain and fever. Surgical history  includes TURBT and endovascular abdominal aortic aneurysm repair. EXAM: CT ABDOMEN AND PELVIS WITH CONTRAST TECHNIQUE: Multidetector CT imaging of the abdomen and pelvis was performed using the standard protocol following bolus administration of intravenous contrast. CONTRAST:  182mL OMNIPAQUE IOHEXOL 300  MG/ML IV. COMPARISON:  12/26/2017 and earlier. FINDINGS: Lower chest: Visualized lung bases clear. Normal heart size. Hepatobiliary: Geographic areas of hepatic steatosis involving the RIGHT lobe. No hepatic parenchymal masses. Calcified granuloma in the lateral segment LEFT lobe. Mildly distended gallbladder with mucosal enhancement, but no evidence of cholelithiasis. No biliary ductal dilation. Pancreas: Normal in appearance without evidence of mass, ductal dilation, or inflammation. Spleen: Normal in size and appearance. Two small accessory splenules MEDIAL and INFERIOR to the spleen. Adrenals/Urinary Tract: Normal appearing adrenal glands. Benign cyst arising from the LOWER pole the RIGHT kidney. Focal areas of scarring in the UPPER pole RIGHT kidney and mid LEFT kidney. No urinary tract calculi on either side. No hydronephrosis. No solid renal masses. Normal appearing decompressed urinary bladder (allowing for the metallic beam hardening streak artifact from the LEFT hip prosthesis which partially obscures the bladder). Stomach/Bowel: Stomach normal in appearance for the degree of distention. Large wide-mouth diverticulum arising from the junction of the descending and transverse duodenum, containing gas. Remaining small bowel normal in appearance, though several small bowel loops are present LATERAL to the descending colon. Diffuse colonic diverticulosis, extensive in the distal descending and sigmoid colon, without evidence of acute diverticulitis. Normal appendix in the RIGHT UPPER pelvis. Vascular/Lymphatic: Prior endovascular abdominal aortic aneurysm repair with aorto bi-iliac stent grafts. The grafts are patent. Mural thrombus is present within the aortic portion of the stent graft. Native aorto-iliofemoral atherosclerosis. Normal-appearing portal venous and systemic venous systems. No pathologic lymphadenopathy. Reproductive: Normal-appearing prostate gland and seminal vesicles for patient age. Other:  None. Musculoskeletal: LEFT hip arthroplasty with anatomic alignment. Thoracolumbar dextroscoliosis. Osseous demineralization. Degenerative disc disease and spondylosis at L4-5 and L5-S1. Diffuse facet degenerative changes throughout the lumbar spine. No acute findings. IMPRESSION: 1. Borderline findings of possible acute cholecystitis. Consider RIGHT UPPER QUADRANT abdominal ultrasound to confirm or deny this possibility. 2. No acute abnormalities otherwise involving the abdomen or pelvis. 3. Geographic areas of hepatic steatosis involving the RIGHT lobe. No hepatic parenchymal masses. 4. Large wide-mouth diverticulum arising from the junction of the descending and transverse duodenum. 5. Diffuse colonic diverticulosis without evidence of acute diverticulitis. 6. Prior endovascular abdominal aortic aneurysm repair with aorto bi-iliac stent grafts. The grafts are patent, though mural thrombus is present within the aortic portion of the graft. Aortic Atherosclerosis (ICD10-I70.0). Electronically Signed   By: Evangeline Dakin M.D.   On: 04/17/2019 16:27   Dg Chest Portable 1 View  Result Date: 04/17/2019 CLINICAL DATA:  Cough. EXAM: PORTABLE CHEST 1 VIEW COMPARISON:  11/10/2018 FINDINGS: Stable changes from prior CABG surgery. Cardiac silhouette is normal in size and configuration. No mediastinal or hilar masses. Lungs are hyperexpanded. Prominent bronchovascular markings most evident in the lower lungs. Minor linear scarring at the right lateral lung base, stable. Lungs are otherwise clear. No pleural effusion or pneumothorax. Skeletal structures are demineralized but grossly intact. IMPRESSION: 1. No acute cardiopulmonary disease. 2. Hyperexpanded lungs suggests COPD. Stable changes from prior CABG surgery. Electronically Signed   By: Lajean Manes M.D.   On: 04/17/2019 14:49   Mr Abdomen Mrcp Moise Boring Contast  Result Date: 04/18/2019 CLINICAL DATA:  Nausea and vomiting over 5 days. Fever. Leukocytosis. EXAM:  MRI ABDOMEN WITHOUT AND WITH CONTRAST (INCLUDING MRCP) TECHNIQUE:  Multiplanar multisequence MR imaging of the abdomen was performed both before and after the administration of intravenous contrast. Heavily T2-weighted images of the biliary and pancreatic ducts were obtained, and three-dimensional MRCP images were rendered by post processing. CONTRAST:  15mL GADAVIST GADOBUTROL 1 MMOL/ML IV SOLN COMPARISON:  Serve multiple CT scan from 04/17/2019 FINDINGS: Extensive artifact obscuring the abdominal aorta and surrounding regions, attributed to the patient's aorta bi-iliac stent grafts. Despite efforts by the technologist and patient, motion artifact is present on today's exam and could not be eliminated. This reduces exam sensitivity and specificity. Lower chest: Unremarkable Hepatobiliary: Large parts of the liver and biliary tree are obscured by artifact related to the patient's aorta bi-iliac stent graft. No compelling abnormal enhancement along the margins of the intrahepatic biliary tree. The gallbladder appears unremarkable. The extrahepatic biliary tree is obscured by artifact on most sequences, but does not appear dilated on the axial T2 sequences. No obvious focal hepatic lesion is observed. Pancreas:  Unremarkable where visible. Spleen:  Unremarkable Adrenals/Urinary Tract: Right kidney lower pole cyst is partially included. Much of the kidneys is obscured by metal artifact. Stomach/Bowel: Large duodenal diverticulum extending from the periampullary region through the proximal transverse diverticulum. This could actually represent a cluster of 2 or more diverticula. No compelling evidence of inflammation, much of this process is obscured by metal artifact. Descending and sigmoid colon diverticulosis. Vascular/Lymphatic:  Obscured by aorta bi-iliac stent graft. Other:  No supplemental non-categorized findings. Musculoskeletal: Mild dextroconvex lumbar rotary scoliosis. IMPRESSION: 1. Unfortunately much of the  abdomen is obscured by the metal artifact from the patient's aorta bi-iliac stent graft. I do not see definite abnormal biliary dilatation nor are there findings of beading or irregularity along the bowel ducts to further suggest cholangitis, but sensitivity must be understood to be markedly reduced. Motion artifact plates are rule in obscuring findings as well. 2. No current gallbladder wall thickening is observed. 3. Large duodenal diverticulum or cluster of diverticular extending from the periampullary region to the distal transverse duodenum. 4. Descending and sigmoid colon diverticulosis. Electronically Signed   By: Van Clines M.D.   On: 04/18/2019 12:06   US Abdomen Limited Ruq  Result Date: 04/17/2019 CLINICAL DATA:  Right upper quadrant pain for 6 days. EXAM: ULTRASOUND ABDOMEN LIMITED RIGHT UPPER QUADRANT COMPARISON:  CT angiogram of the abdomen and pelvis 12/26/2017. FINDINGS: Gallbladder: There is some sludge within the gallbladder. No gallstones or wall thickening visualized. No sonographic Murphy sign noted by sonographer. Common bile duct: Diameter: 0.5 cm Liver: Echogenicity is increased. No focal lesion. Portal vein is patent on color Doppler imaging with normal direction of blood flow towards the liver. Other: None. IMPRESSION: Gallbladder sludge.  Negative for stones or cholecystitis. Fatty infiltration of the liver. Electronically Signed   By: Inge Rise M.D.   On: 04/17/2019 13:24     Medical Consultants:    None.  Anti-Infectives:   Zosyn  Subjective:    Dustine Weisner Dalto abdominal pain is controlled.  Objective:    Vitals:   04/19/19 0037 04/19/19 0224 04/19/19 0441 04/19/19 0804  BP: 119/77  (!) 129/91 132/78  Pulse: 77  88 79  Resp: 18  18 18   Temp: 98.4 F (36.9 C)  (!) 97.4 F (36.3 C) 98.1 F (36.7 C)  TempSrc: Oral  Oral Oral  SpO2: 93%  93% 94%  Weight:  88.7 kg    Height:       SpO2: 94 % O2 Flow Rate (L/min): 3 L/min  Intake/Output  Summary (Last 24 hours) at 04/19/2019 1051 Last data filed at 04/19/2019 0934 Gross per 24 hour  Intake 2050.97 ml  Output 1100 ml  Net 950.97 ml   Filed Weights   04/18/19 0500 04/18/19 1351 04/19/19 0224  Weight: 94.7 kg 90.4 kg 88.7 kg    Exam: General exam: In no acute distress. Respiratory system: Good air movement and clear to auscultation. Cardiovascular system: S1 & S2 heard, RRR. No JVD. Gastrointestinal system: Abdomen is nondistended, soft and nontender.  Central nervous system: Alert and oriented. No focal neurological deficits. Extremities: No pedal edema. Skin: No rashes, lesions or ulcers Psychiatry: Judgement and insight appear normal. Mood & affect appropriate.    Data Reviewed:    Labs: Basic Metabolic Panel: Recent Labs  Lab 04/17/19 1132 04/18/19 0350 04/19/19 0718  NA 137 137 136  K 3.9 3.7 3.5  CL 100 99 95*  CO2 26 24 28   GLUCOSE 138* 109* 94  BUN 15 8 9   CREATININE 0.89 0.72 0.79  CALCIUM 9.2 8.6* 8.9   GFR Estimated Creatinine Clearance: 103.3 mL/min (by C-G formula based on SCr of 0.79 mg/dL). Liver Function Tests: Recent Labs  Lab 04/17/19 1132 04/18/19 0350 04/19/19 0718  AST 519* 205* 79*  ALT 223* 146* 100*  ALKPHOS 280* 243* 222*  BILITOT 5.5* 3.5* 2.1*  PROT 7.4 6.3* 7.0  ALBUMIN 3.9 3.4* 3.6   Recent Labs  Lab 04/17/19 1132  LIPASE 17   No results for input(s): AMMONIA in the last 168 hours. Coagulation profile Recent Labs  Lab 04/18/19 0350  INR 1.1   COVID-19 Labs  No results for input(s): DDIMER, FERRITIN, LDH, CRP in the last 72 hours.  Lab Results  Component Value Date   SARSCOV2NAA NEGATIVE 04/17/2019   [place] NEGATIVE 01/31/2019    CBC: Recent Labs  Lab 04/17/19 1132 04/18/19 0350 04/19/19 0718  WBC 13.2* 7.6 7.8  HGB 17.3* 14.8 16.1  HCT 49.8 44.2 46.4  MCV 94.0 96.5 93.2  PLT 284 237 255   Cardiac Enzymes: No results for input(s): CKTOTAL, CKMB, CKMBINDEX, TROPONINI in the last 168  hours. BNP (last 3 results) No results for input(s): PROBNP in the last 8760 hours. CBG: No results for input(s): GLUCAP in the last 168 hours. D-Dimer: No results for input(s): DDIMER in the last 72 hours. Hgb A1c: No results for input(s): HGBA1C in the last 72 hours. Lipid Profile: Recent Labs    04/17/19 1800  CHOL 85  HDL 23*  LDLCALC 49  TRIG 67  CHOLHDL 3.7   Thyroid function studies: No results for input(s): TSH, T4TOTAL, T3FREE, THYROIDAB in the last 72 hours.  Invalid input(s): FREET3 Anemia work up: No results for input(s): VITAMINB12, FOLATE, FERRITIN, TIBC, IRON, RETICCTPCT in the last 72 hours. Sepsis Labs: Recent Labs  Lab 04/17/19 1132 04/17/19 1233 04/17/19 1434 04/18/19 0350 04/19/19 0718  WBC 13.2*  --   --  7.6 7.8  LATICACIDVEN  --  1.4 1.2  --   --    Microbiology Recent Results (from the past 240 hour(s))  Fungus Culture & Smear     Status: None (Preliminary result)   Collection Time: 04/11/19 12:55 PM   Specimen: Sputum  Result Value Ref Range Status   MICRO NUMBER: [id number]  Preliminary   SPECIMEN QUALITY: Adequate  Preliminary   Source: SPUTUM  Preliminary   STATUS: PRELIMINARY  Preliminary   SMEAR: No fungal elements seen.  Preliminary   CULTURE: Culture in progress  Preliminary  Respiratory or Resp and Sputum Culture     Status: Abnormal   Collection Time: 04/11/19 12:55 PM   Specimen: Sputum  Result Value Ref Range Status   MICRO NUMBER: [id number]  Final   SPECIMEN QUALITY: Adequate  Final   Source SPUTUM  Final   STATUS: FINAL  Final   GRAM STAIN: yeast (A)  Final    Comment: Gram stain indicates that the specimen is representative of the lower respiratory tract. Few White blood cells seen Many Gram positive cocci Few Gram negative bacilli Few Gram positive bacilli Few Yeast present   RESULT: Growth of normal oropharyngeal flora.  Final  AFB Culture & Smear     Status: None (Preliminary result)   Collection Time: 04/11/19  1:00  PM   Specimen: Sputum   SPUTUM  Result Value Ref Range Status   AFB Specimen Processing Concentration  Final   Acid Fast Smear Negative  Final   Acid Fast Culture Comment  Preliminary    Comment: Specimen has been received and testing has been initiated.  Culture, blood (routine x 2)     Status: None (Preliminary result)   Collection Time: 04/17/19 12:42 PM   Specimen: BLOOD  Result Value Ref Range Status   Specimen Description BLOOD RIGHT ANTECUBITAL  Final   Special Requests   Final    BOTTLES DRAWN AEROBIC AND ANAEROBIC Blood Culture results may not be optimal due to an excessive volume of blood received in culture bottles   Culture  Setup Time   Final    GRAM NEGATIVE RODS ANAEROBIC BOTTLE ONLY CRITICAL VALUE NOTED.  VALUE IS CONSISTENT WITH PREVIOUSLY REPORTED AND CALLED VALUE.    Culture   Final    GRAM NEGATIVE RODS IDENTIFICATION AND SUSCEPTIBILITIES TO FOLLOW Performed at [facility], [place] [address], [place], [place] [address]    Report Status PENDING  Incomplete  Culture, blood (routine x 2)     Status: Abnormal (Preliminary result)   Collection Time: 04/17/19 12:46 PM   Specimen: BLOOD  Result Value Ref Range Status   Specimen Description BLOOD LEFT ANTECUBITAL  Final   Special Requests   Final    BOTTLES DRAWN AEROBIC AND ANAEROBIC Blood Culture adequate volume   Culture  Setup Time   Final    GRAM NEGATIVE RODS IN BOTH AEROBIC AND ANAEROBIC BOTTLES CRITICAL RESULT CALLED TO, READ BACK BY AND VERIFIED WITH: PHRMD L SEAY @0617  04/18/19 BY S GEZAHEGN    Culture (A)  Final    KLEBSIELLA PNEUMONIAE SUSCEPTIBILITIES TO FOLLOW Performed at [facility], [place] [address] [place], [place] [address]    Report Status PENDING  Incomplete  Blood Culture ID Panel (Reflexed)     Status: Abnormal   Collection Time: 04/17/19 12:46 PM  Result Value Ref Range Status   Enterococcus species NOT DETECTED NOT DETECTED Final   Listeria monocytogenes NOT DETECTED NOT  DETECTED Final   Staphylococcus species NOT DETECTED NOT DETECTED Final   Staphylococcus aureus (BCID) NOT DETECTED NOT DETECTED Final   Streptococcus species NOT DETECTED NOT DETECTED Final   Streptococcus agalactiae NOT DETECTED NOT DETECTED Final   Streptococcus pneumoniae NOT DETECTED NOT DETECTED Final   Streptococcus pyogenes NOT DETECTED NOT DETECTED Final   Acinetobacter baumannii NOT DETECTED NOT DETECTED Final   Enterobacteriaceae species DETECTED (A) NOT DETECTED Final    Comment: Enterobacteriaceae represent a large family of gram-negative bacteria, not a single organism. CRITICAL RESULT CALLED TO, READ BACK  BY AND VERIFIED WITH: PHRMD L SEAY @0617  04/18/19 BY S GEZAHEGN    Enterobacter cloacae complex NOT DETECTED NOT DETECTED Final   Escherichia coli NOT DETECTED NOT DETECTED Final   Klebsiella oxytoca NOT DETECTED NOT DETECTED Final   Klebsiella pneumoniae DETECTED (A) NOT DETECTED Final    Comment: CRITICAL RESULT CALLED TO, READ BACK BY AND VERIFIED WITH: PHRMD L SEAY @0617  04/18/19 BY S GEZAHEGN    Proteus species NOT DETECTED NOT DETECTED Final   Serratia marcescens NOT DETECTED NOT DETECTED Final   Carbapenem resistance NOT DETECTED NOT DETECTED Final   Haemophilus influenzae NOT DETECTED NOT DETECTED Final   Neisseria meningitidis NOT DETECTED NOT DETECTED Final   Pseudomonas aeruginosa NOT DETECTED NOT DETECTED Final   Candida albicans NOT DETECTED NOT DETECTED Final   Candida glabrata NOT DETECTED NOT DETECTED Final   Candida krusei NOT DETECTED NOT DETECTED Final   Candida parapsilosis NOT DETECTED NOT DETECTED Final   Candida tropicalis NOT DETECTED NOT DETECTED Final    Comment: Performed at [facility], [place] [address] [place], [place] [address]  SARS CORONAVIRUS 2 (TAT 6-24 HRS) Nasopharyngeal Nasopharyngeal Swab     Status: None   Collection Time: 04/17/19  5:01 PM   Specimen: Nasopharyngeal Swab  Result Value Ref Range Status   SARS Coronavirus 2  NEGATIVE NEGATIVE Final    Comment: (NOTE) SARS-CoV-2 target nucleic acids are NOT DETECTED. The SARS-CoV-2 RNA is generally detectable in upper and lower respiratory specimens during the acute phase of infection. Negative results do not preclude SARS-CoV-2 infection, do not rule out co-infections with other pathogens, and should not be used as the sole basis for treatment or other patient management decisions. Negative results must be combined with clinical observations, patient history, and epidemiological information. The expected result is Negative. Fact Sheet for Patients: SugarRoll.be Fact Sheet for Healthcare Providers: https://www.woods-mathews.com/ This test is not yet approved or cleared by the [place] FDA and  has been authorized for detection and/or diagnosis of SARS-CoV-2 by FDA under an Emergency Use Authorization (EUA). This EUA will remain  in effect (meaning this test can be used) for the duration of the COVID-19 declaration under Section 56 4(b)(1) of the Act, 21 U.S.C. section 360bbb-3(b)(1), unless the authorization is terminated or revoked sooner. Performed at [facility], [place] [address] [place], [place] [address]      Medications:    amLODipine  5 mg Oral Daily   metoprolol tartrate  12.5 mg Oral BID   nicotine  21 mg Transdermal Daily   pantoprazole  40 mg Oral Daily   tamsulosin  0.4 mg Oral QPC supper   Continuous Infusions:  piperacillin-tazobactam (ZOSYN)  IV 3.375 g (04/19/19 0628)      LOS: 2 days   Charlynne Cousins  [facility]  04/19/2019, 10:51 AM

## 2019-04-20 ENCOUNTER — Inpatient Hospital Stay (HOSPITAL_COMMUNITY): Payer: PPO | Admitting: Anesthesiology

## 2019-04-20 ENCOUNTER — Inpatient Hospital Stay (HOSPITAL_COMMUNITY): Payer: PPO

## 2019-04-20 ENCOUNTER — Encounter (HOSPITAL_COMMUNITY)
Admission: EM | Disposition: A | Discharge status: Home / Self Care | Payer: Self-pay | Source: Home / Self Care | Attending: Internal Medicine

## 2019-04-20 ENCOUNTER — Encounter (HOSPITAL_COMMUNITY): Payer: Self-pay | Admitting: Surgery

## 2019-04-20 HISTORY — PX: SPHINCTEROTOMY: SHX5544

## 2019-04-20 HISTORY — PX: BALLOON DILATION: SHX5330

## 2019-04-20 HISTORY — PX: ERCP: SHX5425

## 2019-04-20 HISTORY — PX: REMOVAL OF STONES: SHX5545

## 2019-04-20 HISTORY — PX: BILIARY DILATION: SHX6850

## 2019-04-20 LAB — COMPREHENSIVE METABOLIC PANEL
ALT: 75 U/L — ABNORMAL HIGH (ref 0–44)
AST: 71 U/L — ABNORMAL HIGH (ref 15–41)
Albumin: 3.2 g/dL — ABNORMAL LOW (ref 3.5–5.0)
Alkaline Phosphatase: 169 U/L — ABNORMAL HIGH (ref 38–126)
Anion gap: 15 (ref 5–15)
BUN: 11 mg/dL (ref 8–23)
CO2: 26 mmol/L (ref 22–32)
Calcium: 8.7 mg/dL — ABNORMAL LOW (ref 8.9–10.3)
Chloride: 95 mmol/L — ABNORMAL LOW (ref 98–111)
Creatinine, Ser: 0.84 mg/dL (ref 0.61–1.24)
GFR calc Af Amer: >60 mL/min (ref >60)
GFR calc non Af Amer: >60 mL/min (ref >60)
Glucose, Bld: 159 mg/dL — ABNORMAL HIGH (ref 70–99)
Potassium: 4.1 mmol/L (ref 3.5–5.1)
Sodium: 136 mmol/L (ref 135–145)
Total Bilirubin: 1.3 mg/dL — ABNORMAL HIGH (ref 0.3–1.2)
Total Protein: 6.2 g/dL — ABNORMAL LOW (ref 6.5–8.1)

## 2019-04-20 LAB — CBC
HCT: 42.6 % (ref 39.0–52.0)
Hemoglobin: 15 g/dL (ref 13.0–17.0)
MCH: 32.8 pg (ref 26.0–34.0)
MCHC: 35.2 g/dL (ref 30.0–36.0)
MCV: 93 fL (ref 80.0–100.0)
Platelets: 249 10*3/uL (ref 150–400)
RBC: 4.58 MIL/uL (ref 4.22–5.81)
RDW: 13.1 % (ref 11.5–15.5)
WBC: 13.5 10*3/uL — ABNORMAL HIGH (ref 4.0–10.5)
nRBC: 0 % (ref 0.0–0.2)

## 2019-04-20 LAB — CULTURE, BLOOD (ROUTINE X 2): Special Requests: ADEQUATE

## 2019-04-20 LAB — LIPASE, BLOOD: Lipase: 17 U/L (ref 11–51)

## 2019-04-20 LAB — GLUCOSE, CAPILLARY: Glucose-Capillary: 135 mg/dL — ABNORMAL HIGH (ref 70–99)

## 2019-04-20 SURGERY — ERCP, WITH INTERVENTION IF INDICATED
Anesthesia: General

## 2019-04-20 MED ORDER — INDOMETHACIN 50 MG RE SUPP
RECTAL | Status: AC
  Filled 2019-04-20: qty 2

## 2019-04-20 MED ORDER — ONDANSETRON HCL 4 MG/2ML IJ SOLN
INTRAMUSCULAR | Status: DC | PRN
  Administered 2019-04-20: 4 mg via INTRAVENOUS

## 2019-04-20 MED ORDER — CEFAZOLIN SODIUM-DEXTROSE 2-4 GM/100ML-% IV SOLN
2.0000 g | Freq: Three times a day (TID) | INTRAVENOUS | Status: DC
  Administered 2019-04-20 – 2019-04-21 (×3): 2 g via INTRAVENOUS
  Filled 2019-04-20 (×6): qty 100

## 2019-04-20 MED ORDER — LACTATED RINGERS IV SOLN
INTRAVENOUS | Status: DC | PRN
  Administered 2019-04-20 (×2): via INTRAVENOUS

## 2019-04-20 MED ORDER — SODIUM CHLORIDE 0.9 % IV SOLN
INTRAVENOUS | Status: DC | PRN
  Administered 2019-04-20: 30 mL

## 2019-04-20 MED ORDER — FENTANYL CITRATE (PF) 100 MCG/2ML IJ SOLN
INTRAMUSCULAR | Status: AC
  Filled 2019-04-20: qty 2

## 2019-04-20 MED ORDER — SUGAMMADEX SODIUM 200 MG/2ML IV SOLN
INTRAVENOUS | Status: DC | PRN
  Administered 2019-04-20: 200 mg via INTRAVENOUS

## 2019-04-20 MED ORDER — GLUCAGON HCL RDNA (DIAGNOSTIC) 1 MG IJ SOLR
INTRAMUSCULAR | Status: AC
  Filled 2019-04-20: qty 2

## 2019-04-20 MED ORDER — GLUCAGON HCL RDNA (DIAGNOSTIC) 1 MG IJ SOLR
INTRAMUSCULAR | Status: DC | PRN
  Administered 2019-04-20 (×3): .2 mg via INTRAVENOUS

## 2019-04-20 MED ORDER — INDOMETHACIN 50 MG RE SUPP
RECTAL | Status: DC | PRN
  Administered 2019-04-20: 100 mg via RECTAL

## 2019-04-20 MED ORDER — MIDAZOLAM HCL 2 MG/2ML IJ SOLN
INTRAMUSCULAR | Status: AC
  Filled 2019-04-20: qty 2

## 2019-04-20 MED ORDER — FENTANYL CITRATE (PF) 100 MCG/2ML IJ SOLN
INTRAMUSCULAR | Status: DC | PRN
  Administered 2019-04-20: 100 ug via INTRAVENOUS

## 2019-04-20 MED ORDER — DEXAMETHASONE SODIUM PHOSPHATE 10 MG/ML IJ SOLN
INTRAMUSCULAR | Status: DC | PRN
  Administered 2019-04-20: 10 mg via INTRAVENOUS

## 2019-04-20 MED ORDER — ROCURONIUM BROMIDE 100 MG/10ML IV SOLN
INTRAVENOUS | Status: DC | PRN
  Administered 2019-04-20: 20 mg via INTRAVENOUS
  Administered 2019-04-20: 60 mg via INTRAVENOUS

## 2019-04-20 MED ORDER — PHENYLEPHRINE 40 MCG/ML (10ML) SYRINGE FOR IV PUSH (FOR BLOOD PRESSURE SUPPORT)
PREFILLED_SYRINGE | INTRAVENOUS | Status: DC | PRN
  Administered 2019-04-20: 80 ug via INTRAVENOUS

## 2019-04-20 MED ORDER — PROPOFOL 10 MG/ML IV BOLUS
INTRAVENOUS | Status: DC | PRN
  Administered 2019-04-20: 120 mg via INTRAVENOUS

## 2019-04-20 MED ORDER — LIDOCAINE 2% (20 MG/ML) 5 ML SYRINGE
INTRAMUSCULAR | Status: DC | PRN
  Administered 2019-04-20: 100 mg via INTRAVENOUS

## 2019-04-20 MED ORDER — ALBUTEROL SULFATE HFA 108 (90 BASE) MCG/ACT IN AERS
INHALATION_SPRAY | RESPIRATORY_TRACT | Status: DC | PRN
  Administered 2019-04-20: 6 via RESPIRATORY_TRACT

## 2019-04-20 MED ORDER — MIDAZOLAM HCL 5 MG/5ML IJ SOLN
INTRAMUSCULAR | Status: DC | PRN
  Administered 2019-04-20: 2 mg via INTRAVENOUS

## 2019-04-20 NOTE — Anesthesia Procedure Notes (Signed)
Procedure Name: Intubation Date/Time: 04/20/2019 3:10 PM Performed by: Candis Shine, CRNA Pre-anesthesia Checklist: Patient identified, Emergency Drugs available, Suction available and Patient being monitored Patient Re-evaluated:Patient Re-evaluated prior to induction Oxygen Delivery Method: Circle System Utilized Preoxygenation: Pre-oxygenation with 100% oxygen Induction Type: IV induction Ventilation: Mask ventilation without difficulty Laryngoscope Size: Mac and 4 Grade View: Grade II Tube type: Oral Tube size: 7.5 mm Number of attempts: 1 Airway Equipment and Method: Stylet and Oral airway Placement Confirmation: ETT inserted through vocal cords under direct vision,  positive ETCO2 and breath sounds checked- equal and bilateral Secured at: 22 cm Tube secured with: Tape Dental Injury: Teeth and Oropharynx as per pre-operative assessment

## 2019-04-20 NOTE — Progress Notes (Signed)
PROGRESS NOTE    Carl Lee  [id number] DOB: Dec 17, 1955 DOA: 04/17/2019 PCP: [name], MD    Brief Narrative:   Carl Lee is an 63 y.o. male with past medical history significant for essential hypertension, prediabetes COPD bladder cancer coronary artery disease status post CABG and a AAA repair comes into the emergency room for right upper quadrant pain nausea and vomiting since 5 days prior to admission.  He is also been having fever nausea and vomiting and a dark urine leukocytosis was 13 right upper quadrant ultrasound show no acute finding CT scan of the abdomen and pelvis showed possible acute cholecystitis. Started on IV fluids IV Dilaudid and IV Zosyn General surgery has been consulted. S/p of LAPAROSCOPIC CHOLECYSTECTOMY 10/8 by Dr. [name]  Interim History: 1. blood cultures show klebsiella (resistant to amipicillin only) . Changed Abx to ancef. 2.  ERCP by Dr. Lyndel Safe 10/9: Duodenal stricture s/p dilatation. choledocholithiasis s/p biliary sphincterotomy with stone extraction. Multiple large duodenal diverticula.  Assessment & Plan:   Principal Problem:   Acute cholecystitis Active Problems:   Hypertension   History of bladder cancer   History of AAA (abdominal aortic aneurysm) repair   Dyslipidemia   COPD, moderate (HCC)   OSA (obstructive sleep apnea)   Coronary artery disease   GERD (gastroesophageal reflux disease)   Hyperbilirubinemia   Prediabetes   Elevated transaminase level   Acute cholecystitis: --POD1 of cholecystectomy  Anti-infectives (From admission, onward)   Start     Dose/Rate Route Frequency Ordered Stop   04/20/19 1500  ceFAZolin (ANCEF) IVPB 2g/100 mL premix     2 g 200 mL/hr over 30 Minutes Intravenous Every 8 hours 04/20/19 1323     04/17/19 2200  piperacillin-tazobactam (ZOSYN) IVPB 3.375 g  Status:  Discontinued     3.375 g 12.5 mL/hr over 240 Minutes Intravenous Every 8 hours 04/17/19 1810 04/20/19 1323   04/17/19  1245  piperacillin-tazobactam (ZOSYN) IVPB 3.375 g     3.375 g 100 mL/hr over 30 Minutes Intravenous  Once 04/17/19 1234 04/17/19 1331       Active Problems:   Hypertension   History of bladder cancer   History of AAA (abdominal aortic aneurysm) repair   Dyslipidemia   COPD, moderate (HCC)   OSA (obstructive sleep apnea)   Coronary artery disease   GERD (gastroesophageal reflux disease)   Hyperbilirubinemia   Prediabetes   Elevated transaminase level    Acute cholecystitis/acute cholangitis: CT Scan of the abdomen and pelvis on 04/18/2019 showed calcified granuloma, mildly distended gallbladder with mucosal enhancement no evidence of cholecystitis no biliary dilation. CT scan of the chest done in May 2020 showed cholelithiasis. Surgery has been consulted. S/p of LAPAROSCOPIC CHOLECYSTECTOMY 10/8. GI consulted. ERCP by Dr. Lyndel Safe 10/9 showed duodenal stricture s/p dilatation. choledocholithiasis s/p biliary sphincterotomy with stone extraction. Multiple large duodenal diverticula. Dr. Lyndel Safe recommended to start clear liquid diet at 6 PM tonight. Advance to low-fat diet in a.m. Blood cultures show klebsiella (resistant to amipicillin only) . Changed Abx to ancef (this is discussed with the pharmacist) -Continue antibiotics, Ancef -pain control and prn dilaudid for pain. -prn Zoran  Klebsiella bacteremia: likely due to acute cholangitis -switched IV Zosyn --> ancef as above - Will need antibiotic for 2 weeks.  Essential  Hypertension: blood pressure seems to be stable. -Continue metoprolol amlodipine  Hyperlipidemia: Hold statin  Coronary artery disease status post CABG: Cardiac biomarkers have been negative, EKG showed no signs of ischemia. Hold  aspirin continue metoprolol.  COPD: Patient is currently on 2 L of oxygen nasal cannula continuous. Bronchodilators Seems to be stable continue duo nebs.  Abdominal aortic aneurysm status post repair Aware.  Sleep  apnea: Continue CPAP.  History of bladder cancer Noted.   DVT prophylaxis: SCD Code Status: fall Family Communication: family is not at bedside. Disposition Plan:  Barriers for discharge:  Patient is still has significant abdominal pain and need IV pain medications. Still has leukocytosis with WB 13.5.   Consultants:  -GI -General surgeon  Procedures:  ERCP  LAPAROSCOPIC CHOLECYSTECTOMY  Subjective: Patient has nausea, no vomiting, passed some gas, no bowel movement.  Still has abdominal pain, no fever or chills.  Denies chest pain or shortness of breath.  Objective: Vitals:   04/20/19 1650 04/20/19 1700 04/20/19 1710 04/20/19 1744  BP: (!) 166/84 (!) 169/100 (!) 158/99 (!) 144/83  Pulse: (!) 102 (!) 101 93 89  Resp: (!) 22 16 (!) 25 20  Temp: 98.1 F (36.7 C)   98.1 F (36.7 C)  TempSrc: Temporal   Oral  SpO2: 96% 95% 91% 96%  Weight:      Height:        Intake/Output Summary (Last 24 hours) at 04/20/2019 1939 Last data filed at 04/20/2019 1749 Gross per 24 hour  Intake 1291.74 ml  Output 1650 ml  Net -358.26 ml   Filed Weights   04/19/19 0224 04/20/19 0206 04/20/19 1329  Weight: 88.7 kg 90.9 kg 90.9 kg    Examination: Physical Exam:  General: Not in acute distress HEENT: PERRL, EOMI, no scleral icterus, No JVD or bruit Cardiac: S1/S2, RRR, No murmurs, gallops or rubs Pulm:  No rales, wheezing, rhonchi or rubs. Abd: Soft, nondistended, has tenderness in central and RUQ, no rebound pain, no organomegaly, BS present Ext: No edema. 2+DP/PT pulse bilaterally Musculoskeletal: No joint deformities, erythema, or stiffness, ROM full Skin: No rashes.  Neuro: Alert and oriented X3, cranial nerves II-XII grossly intact, muscle strength 5/5 in all  Psych: Patient is not psychotic, no suicidal or hemocidal ideation.    Data Reviewed: I have personally reviewed following labs and imaging studies  CBC: Recent Labs  Lab 04/17/19 1132 04/18/19 0350  04/19/19 0718 04/20/19 0457  WBC 13.2* 7.6 7.8 13.5*  HGB 17.3* 14.8 16.1 15.0  HCT 49.8 44.2 46.4 42.6  MCV 94.0 96.5 93.2 93.0  PLT 284 237 255 0000000   Basic Metabolic Panel: Recent Labs  Lab 04/17/19 1132 04/18/19 0350 04/19/19 0718 04/20/19 0457  NA 137 137 136 136  K 3.9 3.7 3.5 4.1  CL 100 99 95* 95*  CO2 26 24 28 26   GLUCOSE 138* 109* 94 159*  BUN 15 8 9 11   CREATININE 0.89 0.72 0.79 0.84  CALCIUM 9.2 8.6* 8.9 8.7*   GFR: Estimated Creatinine Clearance: 99.5 mL/min (by C-G formula based on SCr of 0.84 mg/dL). Liver Function Tests: Recent Labs  Lab 04/17/19 1132 04/18/19 0350 04/19/19 0718 04/20/19 0457  AST 519* 205* 79* 71*  ALT 223* 146* 100* 75*  ALKPHOS 280* 243* 222* 169*  BILITOT 5.5* 3.5* 2.1* 1.3*  PROT 7.4 6.3* 7.0 6.2*  ALBUMIN 3.9 3.4* 3.6 3.2*   Recent Labs  Lab 04/17/19 1132 04/20/19 0457  LIPASE 17 17   No results for input(s): AMMONIA in the last 168 hours. Coagulation Profile: Recent Labs  Lab 04/18/19 0350  INR 1.1   Cardiac Enzymes: No results for input(s): CKTOTAL, CKMB, CKMBINDEX, TROPONINI in the last 168  hours. BNP (last 3 results) No results for input(s): PROBNP in the last 8760 hours. HbA1C: No results for input(s): HGBA1C in the last 72 hours. CBG: Recent Labs  Lab 04/20/19 0702  GLUCAP 135*   Lipid Profile: No results for input(s): CHOL, HDL, LDLCALC, TRIG, CHOLHDL, LDLDIRECT in the last 72 hours. Thyroid Function Tests: No results for input(s): TSH, T4TOTAL, FREET4, T3FREE, THYROIDAB in the last 72 hours. Anemia Panel: No results for input(s): VITAMINB12, FOLATE, FERRITIN, TIBC, IRON, RETICCTPCT in the last 72 hours. Sepsis Labs: Recent Labs  Lab 04/17/19 1233 04/17/19 1434  LATICACIDVEN 1.4 1.2    Recent Results (from the past 240 hour(s))  Fungus Culture & Smear     Status: None (Preliminary result)   Collection Time: 04/11/19 12:55 PM   Specimen: Sputum  Result Value Ref Range Status   MICRO  NUMBER: [id number]  Preliminary   SPECIMEN QUALITY: Adequate  Preliminary   Source: SPUTUM  Preliminary   STATUS: PRELIMINARY  Preliminary   SMEAR: No fungal elements seen.  Preliminary   CULTURE: Culture in progress  Preliminary  Respiratory or Resp and Sputum Culture     Status: Abnormal   Collection Time: 04/11/19 12:55 PM   Specimen: Sputum  Result Value Ref Range Status   MICRO NUMBER: [id number]  Final   SPECIMEN QUALITY: Adequate  Final   Source SPUTUM  Final   STATUS: FINAL  Final   GRAM STAIN: yeast (A)  Final    Comment: Gram stain indicates that the specimen is representative of the lower respiratory tract. Few White blood cells seen Many Gram positive cocci Few Gram negative bacilli Few Gram positive bacilli Few Yeast present   RESULT: Growth of normal oropharyngeal flora.  Final  AFB Culture & Smear     Status: None (Preliminary result)   Collection Time: 04/11/19  1:00 PM   Specimen: Sputum   SPUTUM  Result Value Ref Range Status   AFB Specimen Processing Concentration  Final   Acid Fast Smear Negative  Final   Acid Fast Culture Comment  Preliminary    Comment: Specimen has been received and testing has been initiated.  Culture, blood (routine x 2)     Status: Abnormal   Collection Time: 04/17/19 12:42 PM   Specimen: BLOOD  Result Value Ref Range Status   Specimen Description BLOOD RIGHT ANTECUBITAL  Final   Special Requests   Final    BOTTLES DRAWN AEROBIC AND ANAEROBIC Blood Culture results may not be optimal due to an excessive volume of blood received in culture bottles   Culture  Setup Time   Final    GRAM NEGATIVE RODS ANAEROBIC BOTTLE ONLY CRITICAL VALUE NOTED.  VALUE IS CONSISTENT WITH PREVIOUSLY REPORTED AND CALLED VALUE.    Culture (A)  Final    KLEBSIELLA PNEUMONIAE SUSCEPTIBILITIES PERFORMED ON PREVIOUS CULTURE WITHIN THE LAST 5 DAYS. Performed at [facility], [place] [address] [place], [place] [address]    Report Status 04/20/2019 FINAL  Final   Culture, blood (routine x 2)     Status: Abnormal   Collection Time: 04/17/19 12:46 PM   Specimen: BLOOD  Result Value Ref Range Status   Specimen Description BLOOD LEFT ANTECUBITAL  Final   Special Requests   Final    BOTTLES DRAWN AEROBIC AND ANAEROBIC Blood Culture adequate volume   Culture  Setup Time   Final    GRAM NEGATIVE RODS IN BOTH AEROBIC AND ANAEROBIC BOTTLES CRITICAL RESULT CALLED TO, READ BACK  BY AND VERIFIED WITH: PHRMD L SEAY @0617  04/18/19 BY S GEZAHEGN Performed at [facility] [address], [place], [place] [address]    Culture KLEBSIELLA PNEUMONIAE (A)  Final   Report Status 04/20/2019 FINAL  Final   Organism ID, Bacteria KLEBSIELLA PNEUMONIAE  Final      Susceptibility   Klebsiella pneumoniae - MIC*    AMPICILLIN >=32 RESISTANT Resistant     CEFAZOLIN <=4 SENSITIVE Sensitive     CEFEPIME <=1 SENSITIVE Sensitive     CEFTAZIDIME <=1 SENSITIVE Sensitive     CEFTRIAXONE <=1 SENSITIVE Sensitive     CIPROFLOXACIN <=0.25 SENSITIVE Sensitive     GENTAMICIN <=1 SENSITIVE Sensitive     IMIPENEM <=0.25 SENSITIVE Sensitive     TRIMETH/SULFA <=20 SENSITIVE Sensitive     AMPICILLIN/SULBACTAM <=2 SENSITIVE Sensitive     PIP/TAZO <=4 SENSITIVE Sensitive     Extended ESBL NEGATIVE Sensitive     * KLEBSIELLA PNEUMONIAE  Blood Culture ID Panel (Reflexed)     Status: Abnormal   Collection Time: 04/17/19 12:46 PM  Result Value Ref Range Status   Enterococcus species NOT DETECTED NOT DETECTED Final   Listeria monocytogenes NOT DETECTED NOT DETECTED Final   Staphylococcus species NOT DETECTED NOT DETECTED Final   Staphylococcus aureus (BCID) NOT DETECTED NOT DETECTED Final   Streptococcus species NOT DETECTED NOT DETECTED Final   Streptococcus agalactiae NOT DETECTED NOT DETECTED Final   Streptococcus pneumoniae NOT DETECTED NOT DETECTED Final   Streptococcus pyogenes NOT DETECTED NOT DETECTED Final   Acinetobacter baumannii NOT DETECTED NOT DETECTED Final    Enterobacteriaceae species DETECTED (A) NOT DETECTED Final    Comment: Enterobacteriaceae represent a large family of gram-negative bacteria, not a single organism. CRITICAL RESULT CALLED TO, READ BACK BY AND VERIFIED WITH: PHRMD L SEAY @0617  04/18/19 BY S GEZAHEGN    Enterobacter cloacae complex NOT DETECTED NOT DETECTED Final   Escherichia coli NOT DETECTED NOT DETECTED Final   Klebsiella oxytoca NOT DETECTED NOT DETECTED Final   Klebsiella pneumoniae DETECTED (A) NOT DETECTED Final    Comment: CRITICAL RESULT CALLED TO, READ BACK BY AND VERIFIED WITH: PHRMD L SEAY @0617  04/18/19 BY S GEZAHEGN    Proteus species NOT DETECTED NOT DETECTED Final   Serratia marcescens NOT DETECTED NOT DETECTED Final   Carbapenem resistance NOT DETECTED NOT DETECTED Final   Haemophilus influenzae NOT DETECTED NOT DETECTED Final   Neisseria meningitidis NOT DETECTED NOT DETECTED Final   Pseudomonas aeruginosa NOT DETECTED NOT DETECTED Final   Candida albicans NOT DETECTED NOT DETECTED Final   Candida glabrata NOT DETECTED NOT DETECTED Final   Candida krusei NOT DETECTED NOT DETECTED Final   Candida parapsilosis NOT DETECTED NOT DETECTED Final   Candida tropicalis NOT DETECTED NOT DETECTED Final    Comment: Performed at [facility], [place] [address] [place], [place] [address]  SARS CORONAVIRUS 2 (TAT 6-24 HRS) Nasopharyngeal Nasopharyngeal Swab     Status: None   Collection Time: 04/17/19  5:01 PM   Specimen: Nasopharyngeal Swab  Result Value Ref Range Status   SARS Coronavirus 2 NEGATIVE NEGATIVE Final    Comment: (NOTE) SARS-CoV-2 target nucleic acids are NOT DETECTED. The SARS-CoV-2 RNA is generally detectable in upper and lower respiratory specimens during the acute phase of infection. Negative results do not preclude SARS-CoV-2 infection, do not rule out co-infections with other pathogens, and should not be used as the sole basis for treatment or other patient management decisions. Negative  results  must be combined with clinical observations, patient history, and epidemiological information. The expected result is Negative. Fact Sheet for Patients: SugarRoll.be Fact Sheet for Healthcare Providers: https://www.woods-mathews.com/ This test is not yet approved or cleared by the [place] FDA and  has been authorized for detection and/or diagnosis of SARS-CoV-2 by FDA under an Emergency Use Authorization (EUA). This EUA will remain  in effect (meaning this test can be used) for the duration of the COVID-19 declaration under Section 56 4(b)(1) of the Act, 21 U.S.C. section 360bbb-3(b)(1), unless the authorization is terminated or revoked sooner. Performed at [facility], [place] [address] [place], [place] [address]   Surgical PCR screen     Status: None   Collection Time: 04/19/19 12:49 PM   Specimen: Nasal Mucosa; Nasal Swab  Result Value Ref Range Status   MRSA, PCR NEGATIVE NEGATIVE Final   Staphylococcus aureus NEGATIVE NEGATIVE Final    Comment: (NOTE) The Xpert SA Assay (FDA approved for NASAL specimens in patients 82 years of age and older), is one component of a comprehensive surveillance program. It is not intended to diagnose infection nor to guide or monitor treatment. Performed at [facility], [place] [address] [place], [place] [address]       Radiology Studies: Dg Cholangiogram Operative  Result Date: 04/19/2019 CLINICAL DATA:  Intraoperative cholangiogram during laparoscopic cholecystectomy. EXAM: INTRAOPERATIVE CHOLANGIOGRAM FLUOROSCOPY TIME:  14 seconds COMPARISON:  MRCP - 04/18/2019 FINDINGS: Intraoperative cholangiographic images of the right upper abdominal quadrant during laparoscopic cholecystectomy are provided for review. Surgical clips overlie the expected location of the gallbladder fossa. Contrast injection demonstrates selective cannulation of the central aspect of the cystic duct. There is  passage of contrast through the central aspect of the cystic duct with filling of a mildly dilated common bile duct. There is passage of contrast though the CBD and into the descending portion of the duodenum. There is minimal reflux of injected contrast into the common hepatic duct and central aspect of the mildly dilated intrahepatic biliary system. There is minimal opacification of the central aspect of the pancreatic duct which appears nondilated. There are 2 persistent nonocclusive filling defects within distal aspect of the CBD. IMPRESSION: There are 2 persistent nonocclusive filling defects within the distal aspect of the CBD, potentially representative of air bubbles though nonocclusive choledocholithiasis could have a similar appearance. Correlation operative report is advised. Further evaluation and management with ERCP could be performed as indicated. Electronically Signed   By: Sandi Mariscal M.D.   On: 04/19/2019 17:00        Scheduled Meds:  amLODipine  5 mg Oral Daily   metoprolol tartrate  12.5 mg Oral BID   mupirocin ointment  1 application Nasal BID   nicotine  21 mg Transdermal Daily   pantoprazole  40 mg Oral Daily   tamsulosin  0.4 mg Oral QPC supper   umeclidinium-vilanterol  1 puff Inhalation Daily   Continuous Infusions:   ceFAZolin (ANCEF) IV       LOS: 3 days    Time spent: 30 min    Ivor Costa, DO [facility] PAGER is on AMION  If 7PM-7AM, please contact night-coverage [email or web address] Password [facility] 04/20/2019, 7:39 PM

## 2019-04-20 NOTE — Transfer of Care (Signed)
Immediate Anesthesia Transfer of Care Note  Patient: Carl Lee  Procedure(s) Performed: ENDOSCOPIC RETROGRADE CHOLANGIOPANCREATOGRAPHY (ERCP) (N/A ) SPHINCTEROTOMY BILIARY DILATION REMOVAL OF STONES BALLOON DILATION (N/A )  Patient Location: Endoscopy Unit  Anesthesia Type:General  Level of Consciousness: awake, alert  and oriented  Airway & Oxygen Therapy: Patient Spontanous Breathing and Patient connected to face mask oxygen  Post-op Assessment: Report given to RN and Post -op Vital signs reviewed and stable  Post vital signs: Reviewed and stable  Last Vitals:  Vitals Value Taken Time  BP    Temp    Pulse    Resp    SpO2      Last Pain:  Vitals:   04/20/19 1329  TempSrc: Temporal  PainSc: 3       Patients Stated Pain Goal: 0 ([id number])  Complications: No apparent anesthesia complications

## 2019-04-20 NOTE — Progress Notes (Signed)
POST ERCP NOTE:  Pt doing well. No abdominal pain or nausea or melena. Vitals stable. Abdominal Exam: soft, nontender. Discussed with Pt and pt's wife. Given ERCP report  Carl Lee

## 2019-04-20 NOTE — Care Management Important Message (Signed)
Important Message  Patient Details  Name: Carl Lee MRN: [id number] Date of Birth: April 05, 1956   Medicare Important Message Given:  Yes     Shelda Altes 04/20/2019, 1:00 PM

## 2019-04-20 NOTE — Plan of Care (Signed)
  Problem: Health Behavior/Discharge Planning: Goal: Ability to manage health-related needs will improve Outcome: Progressing   Problem: Clinical Measurements: Goal: Will remain free from infection Outcome: Progressing   Problem: Activity: Goal: Risk for activity intolerance will decrease Outcome: Progressing   Problem: Nutrition: Goal: Adequate nutrition will be maintained Outcome: Progressing   Problem: Coping: Goal: Level of anxiety will decrease Outcome: Progressing   Problem: Pain Managment: Goal: General experience of comfort will improve Outcome: Progressing   Problem: Safety: Goal: Ability to remain free from injury will improve Outcome: Progressing   

## 2019-04-20 NOTE — Op Note (Signed)
[facility] Patient Name: Carl Lee Procedure Date : 04/20/2019 MRN: [id number] Attending MD: [name] , MD Date of Birth: November 04, 1955 CSN: [id number] Age: 63 Admit Type: Inpatient Procedure:                ERCP Indications:              Common bile duct stone(s) on IOC Providers:                [name], MD, Josie Dixon, RN, Burtis Junes,                            RN, Cherylynn Ridges, Technician, Dellie Catholic, CRNA Referring MD:              Medicines:                See the Anesthesia note for documentation of the                            administered medications Complications:            No immediate complications. Estimated Blood Loss:     Estimated blood loss: none. Procedure:                Pre-Anesthesia Assessment:                           - Prior to the procedure, a History and Physical                            was performed, and patient medications and                            allergies were reviewed. The patient's tolerance of                            previous anesthesia was also reviewed. The risks                            and benefits of the procedure and the sedation                            options and risks were discussed with the patient.                            All questions were answered, and informed consent                            was obtained. Prior Anticoagulants: The patient has                            taken no previous anticoagulant or antiplatelet                            agents. ASA Grade Assessment: III - A patient with  severe systemic disease. After reviewing the risks                            and benefits, the patient was deemed in                            satisfactory condition to undergo the procedure.                           After obtaining informed consent, the scope was                            passed under direct vision. Throughout the   procedure, the patient's blood pressure, pulse, and                            oxygen saturations were monitored continuously. The                            [id number] Olympus gastroscope was                            introduced through the mouth and ERCP long wire was                            passed into the stomach confirmed by fluoroscopy.                            Thereafter [id number] Olympus duodenoscope                            was inserted over the wire with care not to enter                            Zenker's diverticulum, to the first portion of the                            duodenum. Only limited examination was performed.                            Gastric antrum was normal. A benign appearing                            duodenal stricture as below was identified. The                            ERCP was accomplished without difficulty. The                            patient tolerated the procedure well. Scope In: Scope Out: Findings:      Duodenal stricture was noted at the junction of first and second portion       of the duodenum which would not allow duodenoscope to pass. It measured  approximately 10 mm. The stricture was dilated using a 12 followed by       13.5 mm TTS balloon. Thereafter the scope was passed to the second       portion of the duodenum. Few large diverticula were noted in the distal       second portion of the duodenum.      Major papilla was flat but normal. CBD was deeply cannulated using a       sphincterotome and contrast injected. CBD was dilated to 10 mm with 2       filling defects in the distal CBD consistent with stones. The cystic       duct remnant did fill. The right and the left hepatic ducts were normal.       Evidence of cholecystectomy. A 6 mm biliary sphincterotomy was performed       followed by sphincteroplasty using 8 mm, 9 mm and then 10 mm CRE balloon       x 1 minute each. Thereafter 2 stones were  extracted using 12 mm balloon.       These were small black stones measuring 6 and 8 mm. Endoscopic       documentation was obtained. Balloon was trolled several times into the       common bile duct without any residual stones. Postocclusion       cholangiogram could not be performed as the wire came out.      Pancreatic duct intentionally not cannulated. Impression:               -Duodenal stricture s/p dilatation.                           -Choledocholithiasis s/p biliary sphincterotomy                            with stone extraction.                           -Multiple large duodenal diverticula. Recommendation:           -Watch for pancreatitis, bleeding, perforation, and                            cholangitis.                           -Would start clear liquid diet at 6 PM tonight.                            Advance to low-fat diet in a.m.                           -Check CBC, CMP in a.m.                           -Need Protonix 40 mg p.o. once a day.                           -Indocin suppositories were given.                           -  Patient is scheduled for cricopharyngeal myotomy                            at [facility]. Would recommend a good EGD exam at that                            time with attention to the second portion of the                            duodenum as well.                           -Discussed with patient's wife over the phone. Procedure Code(s):        --- Professional ---                           [phone], Endoscopic retrograde                            cholangiopancreatography (ERCP); with                            trans-endoscopic balloon dilation of                            biliary/pancreatic duct(s) or of ampulla                            (sphincteroplasty), including sphincterotomy, when                            performed, each duct                           [phone], Endoscopic catheterization of the biliary                            ductal  system, radiological supervision and                            interpretation Diagnosis Code(s):        --- Professional ---                           K80.50, Calculus of bile duct without cholangitis                            or cholecystitis without obstruction                           R93.2, Abnormal findings on diagnostic imaging of                            liver and biliary tract CPT copyright 2019 [employer]. All rights reserved. The codes documented in this report are preliminary and upon coder review may  be revised to meet current compliance requirements.  [name], MD 04/20/2019 6:02:07 PM This report has been signed electronically. Number of Addenda: 0

## 2019-04-20 NOTE — Progress Notes (Signed)
1 Day Post-Op   Subjective/Chief Complaint: Complains of some incisional soreness Denies SOB   Objective: Vital signs in last 24 hours: Temp:  [97.4 F (36.3 C)-98.7 F (37.1 C)] 98 F (36.7 C) (10/09 0749) Pulse Rate:  [70-100] 83 (10/09 0749) Resp:  [16-24] 18 (10/09 0749) BP: (119-145)/(73-88) 124/78 (10/09 0749) SpO2:  [89 %-94 %] 94 % (10/09 0831) Weight:  [90.9 kg] 90.9 kg (10/09 0206) Last BM Date: 04/17/19  Intake/Output from previous day: 10/08 0701 - 10/09 0700 In: 1663.5 [P.O.:290; I.V.:1261.9; IV Piggyback:111.6] Out: 2170 [Urine:2140; Blood:30] Intake/Output this shift: Total I/O In: -  Out: 100 [Urine:100]  Exam: Awake and alert Abdomen soft, mildly tender, incisions clean  Lab Results:  Recent Labs    04/19/19 0718 04/20/19 0457  WBC 7.8 13.5*  HGB 16.1 15.0  HCT 46.4 42.6  PLT 255 249   BMET Recent Labs    04/19/19 0718 04/20/19 0457  NA 136 136  K 3.5 4.1  CL 95* 95*  CO2 28 26  GLUCOSE 94 159*  BUN 9 11  CREATININE 0.79 0.84  CALCIUM 8.9 8.7*   PT/INR Recent Labs    04/18/19 0350  LABPROT 13.9  INR 1.1   ABG No results for input(s): PHART, HCO3 in the last 72 hours.  Invalid input(s): PCO2, PO2  Studies/Results: Dg Cholangiogram Operative  Result Date: 04/19/2019 CLINICAL DATA:  Intraoperative cholangiogram during laparoscopic cholecystectomy. EXAM: INTRAOPERATIVE CHOLANGIOGRAM FLUOROSCOPY TIME:  14 seconds COMPARISON:  MRCP - 04/18/2019 FINDINGS: Intraoperative cholangiographic images of the right upper abdominal quadrant during laparoscopic cholecystectomy are provided for review. Surgical clips overlie the expected location of the gallbladder fossa. Contrast injection demonstrates selective cannulation of the central aspect of the cystic duct. There is passage of contrast through the central aspect of the cystic duct with filling of a mildly dilated common bile duct. There is passage of contrast though the CBD and into the  descending portion of the duodenum. There is minimal reflux of injected contrast into the common hepatic duct and central aspect of the mildly dilated intrahepatic biliary system. There is minimal opacification of the central aspect of the pancreatic duct which appears nondilated. There are 2 persistent nonocclusive filling defects within distal aspect of the CBD. IMPRESSION: There are 2 persistent nonocclusive filling defects within the distal aspect of the CBD, potentially representative of air bubbles though nonocclusive choledocholithiasis could have a similar appearance. Correlation operative report is advised. Further evaluation and management with ERCP could be performed as indicated. Electronically Signed   By: Sandi Mariscal M.D.   On: 04/19/2019 17:00   Mr Abdomen Mrcp W Wo Contast  Result Date: 04/18/2019 CLINICAL DATA:  Nausea and vomiting over 5 days. Fever. Leukocytosis. EXAM: MRI ABDOMEN WITHOUT AND WITH CONTRAST (INCLUDING MRCP) TECHNIQUE: Multiplanar multisequence MR imaging of the abdomen was performed both before and after the administration of intravenous contrast. Heavily T2-weighted images of the biliary and pancreatic ducts were obtained, and three-dimensional MRCP images were rendered by post processing. CONTRAST:  76mL GADAVIST GADOBUTROL 1 MMOL/ML IV SOLN COMPARISON:  Serve multiple CT scan from 04/17/2019 FINDINGS: Extensive artifact obscuring the abdominal aorta and surrounding regions, attributed to the patient's aorta bi-iliac stent grafts. Despite efforts by the technologist and patient, motion artifact is present on today's exam and could not be eliminated. This reduces exam sensitivity and specificity. Lower chest: Unremarkable Hepatobiliary: Large parts of the liver and biliary tree are obscured by artifact related to the patient's aorta bi-iliac stent graft. No  compelling abnormal enhancement along the margins of the intrahepatic biliary tree. The gallbladder appears unremarkable.  The extrahepatic biliary tree is obscured by artifact on most sequences, but does not appear dilated on the axial T2 sequences. No obvious focal hepatic lesion is observed. Pancreas:  Unremarkable where visible. Spleen:  Unremarkable Adrenals/Urinary Tract: Right kidney lower pole cyst is partially included. Much of the kidneys is obscured by metal artifact. Stomach/Bowel: Large duodenal diverticulum extending from the periampullary region through the proximal transverse diverticulum. This could actually represent a cluster of 2 or more diverticula. No compelling evidence of inflammation, much of this process is obscured by metal artifact. Descending and sigmoid colon diverticulosis. Vascular/Lymphatic:  Obscured by aorta bi-iliac stent graft. Other:  No supplemental non-categorized findings. Musculoskeletal: Mild dextroconvex lumbar rotary scoliosis. IMPRESSION: 1. Unfortunately much of the abdomen is obscured by the metal artifact from the patient's aorta bi-iliac stent graft. I do not see definite abnormal biliary dilatation nor are there findings of beading or irregularity along the bowel ducts to further suggest cholangitis, but sensitivity must be understood to be markedly reduced. Motion artifact plates are rule in obscuring findings as well. 2. No current gallbladder wall thickening is observed. 3. Large duodenal diverticulum or cluster of diverticular extending from the periampullary region to the distal transverse duodenum. 4. Descending and sigmoid colon diverticulosis. Electronically Signed   By: Van Clines M.D.   On: 04/18/2019 12:06    Anti-infectives: Anti-infectives (From admission, onward)   Start     Dose/Rate Route Frequency Ordered Stop   04/17/19 2200  piperacillin-tazobactam (ZOSYN) IVPB 3.375 g     3.375 g 12.5 mL/hr over 240 Minutes Intravenous Every 8 hours 04/17/19 1810     04/17/19 1245  piperacillin-tazobactam (ZOSYN) IVPB 3.375 g     3.375 g 100 mL/hr over 30 Minutes  Intravenous  Once 04/17/19 1234 04/17/19 1331      Assessment/Plan: s/p Procedure(s): LAPAROSCOPIC CHOLECYSTECTOMY (N/A) Intraoperative Cholangiogram (N/A)  POD#1, stable  CBD stones found on c-gram at surgery.  GI is going to proceed with an ERCP today.   LOS: 3 days    [name] 04/20/2019

## 2019-04-20 NOTE — Discharge Instructions (Signed)
[facility] ______[facility] °LAPAROSCOPIC SURGERY: POST OP INSTRUCTIONS °Always review your discharge instruction sheet given to you by the facility where your surgery was performed. °IF YOU HAVE DISABILITY OR FAMILY LEAVE FORMS, YOU MUST BRING THEM TO THE OFFICE FOR PROCESSING.   °DO NOT GIVE THEM TO YOUR DOCTOR. ° °1. A prescription for pain medication may be given to you upon discharge.  Take your pain medication as prescribed, if needed.  If narcotic pain medicine is not needed, then you may take acetaminophen (Tylenol) or ibuprofen (Advil) as needed. °2. Take your usually prescribed medications unless otherwise directed. °3. If you need a refill on your pain medication, please contact your pharmacy.  They will contact our office to request authorization. Prescriptions will not be filled after 5pm or on week-ends. °4. You should follow a light diet the first few days after arrival home, such as soup and crackers, etc.  Be sure to include lots of fluids daily. °5. Most patients will experience some swelling and bruising in the area of the incisions.  Ice packs will help.  Swelling and bruising can take several days to resolve.  °6. It is common to experience some constipation if taking pain medication after surgery.  Increasing fluid intake and taking a stool softener (such as Colace) will usually help or prevent this problem from occurring.  A mild laxative (Milk of Magnesia or Miralax) should be taken according to package instructions if there are no bowel movements after 48 hours. °7. Unless discharge instructions indicate otherwise, you may remove your bandages 24-48 hours after surgery, and you may shower at that time.  You may have steri-strips (small skin tapes) in place directly over the incision.  These strips should be left on the skin for 7-10 days.  If your surgeon used skin glue on the incision, you may shower in 24 hours.  The glue will flake off over the next 2-3 weeks.  Any sutures or  staples will be removed at the office during your follow-up visit. °8. ACTIVITIES:  You may resume regular (light) daily activities beginning the next day--such as daily self-care, walking, climbing stairs--gradually increasing activities as tolerated.  You may have sexual intercourse when it is comfortable.  Refrain from any heavy lifting or straining until approved by your doctor. °a. You may drive when you are no longer taking prescription pain medication, you can comfortably wear a seatbelt, and you can safely maneuver your car and apply brakes. °b. RETURN TO WORK:  __________________________________________________________ °9. You should see your doctor in the office for a follow-up appointment approximately 2-3 weeks after your surgery.  Make sure that you call for this appointment within a day or two after you arrive home to insure a convenient appointment time. °10. OTHER INSTRUCTIONS: __________________________________________________________________________________________________________________________ __________________________________________________________________________________________________________________________ °WHEN TO CALL YOUR DOCTOR: °1. Fever over 101.0 °2. Inability to urinate °3. Continued bleeding from incision. °4. Increased pain, redness, or drainage from the incision. °5. Increasing abdominal pain ° °The clinic staff is available to answer your questions during regular business hours.  Please don’t hesitate to call and ask to speak to one of the nurses for clinical concerns.  If you have a medical emergency, go to the nearest emergency room or call 911.  A surgeon from [facility] is always on call at the hospital. °[address], [address], [place], [place]  [address] ? [address] [address], [place],    [address] °[phone] ? [phone] ? FAX [phone] °Web site:   [email or web address] ° ° °Gallbladder Eating Plan °If you have a gallbladder  condition, you may have trouble digesting fats. Eating a low-fat diet can help reduce your symptoms, and may be helpful before and after having surgery to remove your gallbladder (cholecystectomy). Your health care provider may recommend that you work with a diet and nutrition specialist (dietitian) to help you reduce the amount of fat in your diet. °What are tips for following this plan? °General guidelines °· Limit your fat intake to less than 30% of your total daily calories. If you eat around 1,800 calories each day, this is less than 60 grams (g) of fat per day. °· Fat is an important part of a healthy diet. Eating a low-fat diet can make it hard to maintain a healthy body weight. Ask your dietitian how much fat, calories, and other nutrients you need each day. °· Eat small, frequent meals throughout the day instead of three large meals. °· Drink at least 8-10 cups of fluid a day. Drink enough fluid to keep your urine clear or pale yellow. °· Limit alcohol intake to no more than 1 drink a day for nonpregnant women and 2 drinks a day for men. One drink equals 12 oz of beer, 5 oz of wine, or 1½ oz of hard liquor. °Reading food labels °· Check Nutrition Facts on food labels for the amount of fat per serving. Choose foods with less than 3 grams of fat per serving. °Shopping °· Choose nonfat and low-fat healthy foods. Look for the words “nonfat,” “low fat,” or “fat free.” °· Avoid buying processed or prepackaged foods. °Cooking °· Cook using low-fat methods, such as baking, broiling, grilling, or boiling. °· Cook with small amounts of healthy fats, such as olive oil, grapeseed oil, canola oil, or sunflower oil. °What foods are recommended? ° °· All fresh, frozen, or canned fruits and vegetables. °· Whole grains. °· Low-fat or non-fat (skim) milk and yogurt. °· Lean meat, skinless poultry, fish, eggs, and beans. °· Low-fat protein supplement powders or drinks. °· Spices and herbs. °What foods are not  recommended? °· High-fat foods. These include baked goods, fast food, fatty cuts of meat, ice cream, french toast, sweet rolls, pizza, cheese bread, foods covered with butter, creamy sauces, or cheese. °· Fried foods. These include french fries, tempura, battered fish, breaded chicken, fried breads, and sweets. °· Foods with strong odors. °· Foods that cause bloating and gas. °Summary °· A low-fat diet can be helpful if you have a gallbladder condition, or before and after gallbladder surgery. °· Limit your fat intake to less than 30% of your total daily calories. This is about 60 g of fat if you eat 1,800 calories each day. °· Eat small, frequent meals throughout the day instead of three large meals. °This information is not intended to replace advice given to you by your health care provider. Make sure you discuss any questions you have with your health care provider. °Document Released: 07/03/2013 Document Revised: 10/19/2018 Document Reviewed: 08/05/2016 °Elsevier Patient Education © 2020 [employer] ° °

## 2019-04-20 NOTE — Anesthesia Preprocedure Evaluation (Addendum)
Anesthesia Evaluation  Patient identified by MRN, date of birth, ID band Patient awake    Reviewed: Allergy & Precautions, NPO status , Patient's Chart, lab work & pertinent test results  Airway Mallampati: II  TM Distance: >3 FB Neck ROM: Full    Dental no notable dental hx.    Pulmonary sleep apnea , COPD, former smoker,    Pulmonary exam normal breath sounds clear to auscultation       Cardiovascular hypertension, + CAD and + CABG  Normal cardiovascular exam Rhythm:Regular Rate:Normal     Neuro/Psych negative neurological ROS  negative psych ROS   GI/Hepatic negative GI ROS, Neg liver ROS,   Endo/Other  negative endocrine ROSdiabetes  Renal/GU negative Renal ROS  negative genitourinary   Musculoskeletal negative musculoskeletal ROS (+)   Abdominal   Peds negative pediatric ROS (+)  Hematology negative hematology ROS (+)   Anesthesia Other Findings   Reproductive/Obstetrics negative OB ROS                             Anesthesia Physical Anesthesia Plan  ASA: III  Anesthesia Plan: General   Post-op Pain Management:    Induction: Intravenous  PONV Risk Score and Plan: 2 and Ondansetron, Dexamethasone and Treatment may vary due to age or medical condition  Airway Management Planned: Oral ETT  Additional Equipment:   Intra-op Plan:   Post-operative Plan: Extubation in OR  Informed Consent: I have reviewed the patients History and Physical, chart, labs and discussed the procedure including the risks, benefits and alternatives for the proposed anesthesia with the patient or authorized representative who has indicated his/her understanding and acceptance.     Dental advisory given  Plan Discussed with: CRNA and Surgeon  Anesthesia Plan Comments:         Anesthesia Quick Evaluation

## 2019-04-21 DIAGNOSIS — K8050 Calculus of bile duct without cholangitis or cholecystitis without obstruction: Secondary | ICD-10-CM

## 2019-04-21 LAB — CBC
HCT: 41.5 % (ref 39.0–52.0)
Hemoglobin: 14.4 g/dL (ref 13.0–17.0)
MCH: 32.3 pg (ref 26.0–34.0)
MCHC: 34.7 g/dL (ref 30.0–36.0)
MCV: 93 fL (ref 80.0–100.0)
Platelets: 256 10*3/uL (ref 150–400)
RBC: 4.46 MIL/uL (ref 4.22–5.81)
RDW: 13.2 % (ref 11.5–15.5)
WBC: 10.6 10*3/uL — ABNORMAL HIGH (ref 4.0–10.5)
nRBC: 0 % (ref 0.0–0.2)

## 2019-04-21 LAB — COMPREHENSIVE METABOLIC PANEL
ALT: 73 U/L — ABNORMAL HIGH (ref 0–44)
AST: 80 U/L — ABNORMAL HIGH (ref 15–41)
Albumin: 3.1 g/dL — ABNORMAL LOW (ref 3.5–5.0)
Alkaline Phosphatase: 189 U/L — ABNORMAL HIGH (ref 38–126)
Anion gap: 12 (ref 5–15)
BUN: 11 mg/dL (ref 8–23)
CO2: 28 mmol/L (ref 22–32)
Calcium: 8.7 mg/dL — ABNORMAL LOW (ref 8.9–10.3)
Chloride: 96 mmol/L — ABNORMAL LOW (ref 98–111)
Creatinine, Ser: 0.71 mg/dL (ref 0.61–1.24)
GFR calc Af Amer: >60 mL/min (ref >60)
GFR calc non Af Amer: >60 mL/min (ref >60)
Glucose, Bld: 138 mg/dL — ABNORMAL HIGH (ref 70–99)
Potassium: 3.7 mmol/L (ref 3.5–5.1)
Sodium: 136 mmol/L (ref 135–145)
Total Bilirubin: 0.9 mg/dL (ref 0.3–1.2)
Total Protein: 6.1 g/dL — ABNORMAL LOW (ref 6.5–8.1)

## 2019-04-21 MED ORDER — ONDANSETRON HCL 4 MG PO TABS: 4.0000 mg | ORAL_TABLET | Freq: Four times a day (QID) | ORAL | 0 refills | Status: DC | PRN

## 2019-04-21 MED ORDER — TAMSULOSIN HCL 0.4 MG PO CAPS: 0.4000 mg | ORAL_CAPSULE | Freq: Every day | ORAL | 1 refills | Status: DC

## 2019-04-21 MED ORDER — ACETAMINOPHEN 325 MG PO TABS: 650.0000 mg | ORAL_TABLET | Freq: Four times a day (QID) | ORAL | 0 refills | Status: AC | PRN

## 2019-04-21 MED ORDER — ASPIRIN EC 81 MG PO TBEC: 81.0000 mg | DELAYED_RELEASE_TABLET | Freq: Every day | ORAL | 1 refills | Status: AC

## 2019-04-21 MED ORDER — NICOTINE 21 MG/24HR TD PT24: 21.0000 mg | MEDICATED_PATCH | Freq: Every day | TRANSDERMAL | 0 refills | Status: DC

## 2019-04-21 MED ORDER — BEVESPI AEROSPHERE 9-4.8 MCG/ACT IN AERO: 2.0000 | INHALATION_SPRAY | Freq: Two times a day (BID) | RESPIRATORY_TRACT | 3 refills | Status: DC

## 2019-04-21 MED ORDER — KETOTIFEN FUMARATE 0.025 % OP SOLN: 1.0000 [drp] | Freq: Every day | OPHTHALMIC | 1 refills | Status: AC | PRN

## 2019-04-21 MED ORDER — CEPHALEXIN 500 MG PO CAPS: 500.0000 mg | ORAL_CAPSULE | Freq: Three times a day (TID) | ORAL | 0 refills | Status: DC

## 2019-04-21 MED ORDER — GUAIFENESIN ER 1200 MG PO TB12: 1.0000 | ORAL_TABLET | Freq: Two times a day (BID) | ORAL | 0 refills | Status: DC

## 2019-04-21 MED ORDER — AMLODIPINE BESYLATE 5 MG PO TABS: 5.0000 mg | ORAL_TABLET | Freq: Every day | ORAL | 1 refills | Status: DC

## 2019-04-21 MED ORDER — FAMOTIDINE 20 MG PO TABS: 20.0000 mg | ORAL_TABLET | Freq: Two times a day (BID) | ORAL | 1 refills | Status: DC

## 2019-04-21 MED ORDER — IPRATROPIUM-ALBUTEROL 0.5-2.5 (3) MG/3ML IN SOLN: 3.0000 mL | Freq: Four times a day (QID) | RESPIRATORY_TRACT | 0 refills | Status: DC | PRN

## 2019-04-21 MED ORDER — PANTOPRAZOLE SODIUM 40 MG PO TBEC: 40.0000 mg | DELAYED_RELEASE_TABLET | Freq: Every day | ORAL | 2 refills | Status: DC

## 2019-04-21 MED ORDER — CEPHALEXIN 500 MG PO CAPS: 500.0000 mg | ORAL_CAPSULE | Freq: Three times a day (TID) | ORAL | 0 refills | Status: AC

## 2019-04-21 NOTE — Discharge Summary (Addendum)
Physician Discharge Summary  Carl Lee [id number] DOB: [birth year] DOA: 04/17/2019  PCP: [name], MD  Admit date: 04/17/2019 Discharge date: 04/21/2019   Recommendations for Outpatient Follow-up:  1. Follow up with PCP in 1-2 weeks 2. Please obtain BMP/CBC  3. Please follow up with orthopedic surgeon, gastroenterologist and your PCP  Home Health: none Equipment/Devices: none  Discharge Condition: stable CODE STATUS: fall Diet recommendation: low fat heart health diet  Brief/Interim Summary (HPI)  Carl Lee Vinesis an 63 y.o.malewith past medical history significant for essential hypertension, prediabetes COPD bladder cancer coronary artery disease status post CABG and a AAA repair comes into the emergency room for right upper quadrant pain nausea and vomiting since 5 days prior to admission. He is also been having fever nausea and vomiting and a dark urine leukocytosis was 13 right upper quadrant ultrasound show no acute finding CT scan of the abdomen and pelvis showed possible acute cholecystitis. Started on IV fluids IV Dilaudid and IV Zosyn General surgery has been consulted. S/p of LAPAROSCOPIC CHOLECYSTECTOMY 10/8 by Dr. [name]  Interim History: 1. blood cultures show klebsiella (resistant to amipicillin only) . Changed Abx to ancef. 2.  ERCP by Dr. Lyndel Safe 10/9: Duodenal stricture s/p dilatation. choledocholithiasis s/p biliary sphincterotomy with stone extraction. Multiple large duodenal diverticula.   Discharge Diagnoses and Hospital Course:   Principal Problem:   Acute cholecystitis Active Problems:   Hypertension   History of bladder cancer   History of AAA (abdominal aortic aneurysm) repair   Dyslipidemia   COPD, moderate (HCC)   OSA (obstructive sleep apnea)   Coronary artery disease   GERD (gastroesophageal reflux disease)   Hyperbilirubinemia   Prediabetes   Elevated transaminase level  Acute cholecystitis/acute cholangitis: CT Scan of the  abdomen and pelvis on 04/18/2019 showed calcified granuloma, mildly distended gallbladder with mucosal enhancement no evidence of cholecystitis no biliary dilation. CT scan of the chest done in May 2020 showed cholelithiasis. Surgery has been consulted. S/p of LAPAROSCOPIC CHOLECYSTECTOMY 10/8. GI consulted. ERCP by Dr. Lyndel Safe 10/9 showed duodenal stricture s/p dilatation. choledocholithiasis s/p biliary sphincterotomy with stone extraction. Multiple large duodenal diverticula. Dr. Lyndel Safe recommended to start clear liquid diet at 6 PM tonight. Advance to low-fat diet in a.m. Blood cultures show klebsiella (resistant to amipicillin only) . Changed Abx to ancef (this is discussed with the pharmacist) -Continue antibiotics, Ancef --> changed to Keflex at discharge for total of 2 weeks (this is dicussed with pharmacist)  Klebsiella bacteremia: likely due to acute cholangitis -switched IV Zosyn --> ancef as above -->changed to Keflex at discharge for total of 2 weeks (this is dicussed with pharmacist) - Will need antibiotic for 2 weeks.  Essential Hypertension: blood pressure seems to be stable. -Continue metoprolol amlodipine  Hyperlipidemia: -lipitor   Coronary artery disease status post CABG: Cardiac biomarkers have been negative, EKG showed no signs of ischemia. - aspirin, metoprolol.  COPD: -continue bronchodilators   Abdominal aortic aneurysm status post repair -Aware. -please f/u with PCP  Sleep apnea: Continue CPAP.  History of bladder cancer -Noted.     Discharge Instructions  You were cared for by a hospitalist during your hospital stay. If you have any questions about your discharge medications or the care you received while you were in the hospital after you are discharged, you can call the unit and ask to speak with the hospitalist on call if the hospitalist that took care of you is not available. Once you are discharged, your primary care physician will  handle any  further medical issues. Please note that NO REFILLS for any discharge medications will be authorized once you are discharged, as it is imperative that you return to your primary care physician (or establish a relationship with a primary care physician if you do not have one) for your aftercare needs so that they can reassess your need for medications and monitor your lab values.  Follow up with PCP and general surgery and the gastroenterologist. Take all medications as prescribed. If symptoms change or worsen please return to the ED for evaluation     Allergies as of 04/21/2019      Reactions   Codeine Other (See Comments)   Nightmares   Doxycycline Hives, Other (See Comments)   mild      Medication List    STOP taking these medications   calcium carbonate 500 MG chewable tablet Commonly known as: TUMS - dosed in mg elemental calcium   Flutter Devi   GAS-X PO   LUBRICATING EYE DROPS OP   nystatin 100000 UNIT/ML suspension Commonly known as: MYCOSTATIN   omeprazole 40 MG capsule Commonly known as: PRILOSEC Replaced by: pantoprazole 40 MG tablet   oxyCODONE 5 MG immediate release tablet Commonly known as: Roxicodone     TAKE these medications   acetaminophen 325 MG tablet Commonly known as: TYLENOL Take 2 tablets (650 mg total) by mouth every 6 (six) hours as needed for moderate pain or headache.   albuterol 108 (90 Base) MCG/ACT inhaler Commonly known as: ProAir HFA Inhale 2 puffs into the lungs every 4 (four) hours as needed for wheezing or shortness of breath.   amLODipine 5 MG tablet Commonly known as: NORVASC Take 1 tablet (5 mg total) by mouth daily.   aspirin EC 81 MG tablet Take 1 tablet (81 mg total) by mouth daily.   atorvastatin 20 MG tablet Commonly known as: LIPITOR TAKE 1 TABLET (20 MG TOTAL) BY MOUTH DAILY AT 6 PM.   Bevespi Aerosphere 9-4.8 MCG/ACT Aero Generic drug: Glycopyrrolate-Formoterol Inhale 2 puffs into the lungs 2 (two) times daily.    cephALEXin 500 MG capsule Commonly known as: KEFLEX Take 1 capsule (500 mg total) by mouth 3 (three) times daily for 10 days.   cholecalciferol 25 MCG (1000 UT) tablet Commonly known as: VITAMIN D3 Take 1,000 Units by mouth daily.   famotidine 20 MG tablet Commonly known as: PEPCID Take 1 tablet (20 mg total) by mouth 2 (two) times daily.   Guaifenesin 1200 MG Tb12 Commonly known as: Mucinex Maximum Strength Take 1 tablet (1,200 mg total) by mouth 2 (two) times daily.   ipratropium-albuterol 0.5-2.5 (3) MG/3ML Soln Commonly known as: DUONEB Take 3 mLs by nebulization every 6 (six) hours as needed.   ketotifen 0.025 % ophthalmic solution Commonly known as: Zaditor Place 1 drop into both eyes daily as needed (allergies).   metoprolol tartrate 25 MG tablet Commonly known as: LOPRESSOR TAKE 0.5 TABLETS (12.5 MG TOTAL) BY MOUTH 2 (TWO) TIMES DAILY.   nicotine 21 mg/24hr patch Commonly known as: NICODERM CQ - dosed in mg/24 hours Place 1 patch (21 mg total) onto the skin daily. Start taking on: April 22, 2019   ondansetron 4 MG tablet Commonly known as: ZOFRAN Take 1 tablet (4 mg total) by mouth every 6 (six) hours as needed for nausea.   ONE TOUCH ULTRA TEST test strip Generic drug: glucose blood CHECK ONCE DAILY AND AS NEEDED   pantoprazole 40 MG tablet Commonly known as: PROTONIX Take  1 tablet (40 mg total) by mouth daily. Start taking on: April 22, 2019 Replaces: omeprazole 40 MG capsule   tamsulosin 0.4 MG Caps capsule Commonly known as: FLOMAX Take 1 capsule (0.4 mg total) by mouth daily after supper.   vitamin B-12 500 MCG tablet Commonly known as: CYANOCOBALAMIN Take 1 tablet (500 mcg total) by mouth every other day. What changed: additional instructions      Follow-up Information    [facility], [place] Follow up on 05/10/2019.   Specialty: General Surgery Why: Your appointment is at [facility].  Be at the office 30 minutes early for check in.   Bring photo ID and insuracne information. Contact information: [address] [place] [id number] [phone]        [name], MD Follow up.   Specialties: Gastroenterology, Internal Medicine Contact information: [place] [address] [phone]          Allergies  Allergen Reactions  . Codeine Other (See Comments)    Nightmares  . Doxycycline Hives and Other (See Comments)    mild    Consultations:  GI and general surgeon   Procedures/Studies: Dg Cholangiogram Operative  Result Date: 04/19/2019 CLINICAL DATA:  Intraoperative cholangiogram during laparoscopic cholecystectomy. EXAM: INTRAOPERATIVE CHOLANGIOGRAM FLUOROSCOPY TIME:  14 seconds COMPARISON:  MRCP - 04/18/2019 FINDINGS: Intraoperative cholangiographic images of the right upper abdominal quadrant during laparoscopic cholecystectomy are provided for review. Surgical clips overlie the expected location of the gallbladder fossa. Contrast injection demonstrates selective cannulation of the central aspect of the cystic duct. There is passage of contrast through the central aspect of the cystic duct with filling of a mildly dilated common bile duct. There is passage of contrast though the CBD and into the descending portion of the duodenum. There is minimal reflux of injected contrast into the common hepatic duct and central aspect of the mildly dilated intrahepatic biliary system. There is minimal opacification of the central aspect of the pancreatic duct which appears nondilated. There are 2 persistent nonocclusive filling defects within distal aspect of the CBD. IMPRESSION: There are 2 persistent nonocclusive filling defects within the distal aspect of the CBD, potentially representative of air bubbles though nonocclusive choledocholithiasis could have a similar appearance. Correlation operative report is advised. Further evaluation and management with ERCP could be  performed as indicated. Electronically Signed   By: Sandi Mariscal M.D.   On: 04/19/2019 17:00   Mr Lumbar Spine W/o Contrast  Result Date: 03/31/2019 CLINICAL DATA:  Low back pain EXAM: MRI LUMBAR SPINE WITHOUT CONTRAST TECHNIQUE: Multiplanar, multisequence MR imaging of the lumbar spine was performed. No intravenous contrast was administered. Metal artifact reduction techniques were utilized. COMPARISON:  11/27/2015 FINDINGS: Severe metallic susceptibility artifact resulting from aortoiliac stent graft which partially obscures portions of the lumbar spine. Segmentation:  Standard. Alignment:  Slight lumbar dextrocurvature.  No static listhesis. Vertebrae: The L2 through L5 vertebral bodies are largely obscured by susceptibility artifact. Within those limitations. No fracture, bone lesion, or evidence of discitis identified. Conus medullaris and cauda equina: Conus extends to the L2 level. Conus and cauda equina appear normal. Paraspinal and other soft tissues: Aortoiliac stent graft. Disc levels: T12-L1: No significant disc protrusion, foraminal stenosis, or canal stenosis. L1-L2: Minimal disc bulge.  No foraminal or canal stenosis. L2-L3: Evaluation of the canal on axial images obscured by susceptibility artifact. On the sagittal sequences. There is a focal right paracentral disc protrusion, enlarged from prior. At least mild-to-moderate canal  stenosis. No significant foraminal stenosis evident. L3-L4: Diffuse disc bulge and mild bilateral facet arthrosis results in mild canal stenosis without foraminal stenosis. Findings appears similar to prior. L4-L5: Diffuse disc bulge and moderate bilateral facet arthrosis resulting in mild-to-moderate canal stenosis and mild right foraminal stenosis. Findings slightly progressed from prior. L5-S1: Diffuse disc bulge with left paracentral protrusion and mild bilateral facet arthrosis. There is left subarticular recess stenosis. No significant foraminal or canal stenosis.  Findings similar to prior. IMPRESSION: 1. Examination is degraded by extensive metallic susceptibility artifact related to aortoiliac stent graft. 2. Worsened right paracentral disc protrusion at L2-3 which appears to cause at least mild-to-moderate canal stenosis, suboptimally evaluated. 3. Slight progression of degenerative disc disease at L4-5 where there is mild-to-moderate canal stenosis and mild right foraminal stenosis. Electronically Signed   By: Davina Poke M.D.   On: 03/31/2019 17:17   Ct Abdomen Pelvis W Contrast  Result Date: 04/17/2019 CLINICAL DATA:  63 year old presenting with acute onset of RIGHT UPPER QUADRANT and RIGHT LOWER QUADRANT abdominal pain and fever. Surgical history includes TURBT and endovascular abdominal aortic aneurysm repair. EXAM: CT ABDOMEN AND PELVIS WITH CONTRAST TECHNIQUE: Multidetector CT imaging of the abdomen and pelvis was performed using the standard protocol following bolus administration of intravenous contrast. CONTRAST:  174mL OMNIPAQUE IOHEXOL 300 MG/ML IV. COMPARISON:  12/26/2017 and earlier. FINDINGS: Lower chest: Visualized lung bases clear. Normal heart size. Hepatobiliary: Geographic areas of hepatic steatosis involving the RIGHT lobe. No hepatic parenchymal masses. Calcified granuloma in the lateral segment LEFT lobe. Mildly distended gallbladder with mucosal enhancement, but no evidence of cholelithiasis. No biliary ductal dilation. Pancreas: Normal in appearance without evidence of mass, ductal dilation, or inflammation. Spleen: Normal in size and appearance. Two small accessory splenules MEDIAL and INFERIOR to the spleen. Adrenals/Urinary Tract: Normal appearing adrenal glands. Benign cyst arising from the LOWER pole the RIGHT kidney. Focal areas of scarring in the UPPER pole RIGHT kidney and mid LEFT kidney. No urinary tract calculi on either side. No hydronephrosis. No solid renal masses. Normal appearing decompressed urinary bladder (allowing for  the metallic beam hardening streak artifact from the LEFT hip prosthesis which partially obscures the bladder). Stomach/Bowel: Stomach normal in appearance for the degree of distention. Large wide-mouth diverticulum arising from the junction of the descending and transverse duodenum, containing gas. Remaining small bowel normal in appearance, though several small bowel loops are present LATERAL to the descending colon. Diffuse colonic diverticulosis, extensive in the distal descending and sigmoid colon, without evidence of acute diverticulitis. Normal appendix in the RIGHT UPPER pelvis. Vascular/Lymphatic: Prior endovascular abdominal aortic aneurysm repair with aorto bi-iliac stent grafts. The grafts are patent. Mural thrombus is present within the aortic portion of the stent graft. Native aorto-iliofemoral atherosclerosis. Normal-appearing portal venous and systemic venous systems. No pathologic lymphadenopathy. Reproductive: Normal-appearing prostate gland and seminal vesicles for patient age. Other: None. Musculoskeletal: LEFT hip arthroplasty with anatomic alignment. Thoracolumbar dextroscoliosis. Osseous demineralization. Degenerative disc disease and spondylosis at L4-5 and L5-S1. Diffuse facet degenerative changes throughout the lumbar spine. No acute findings. IMPRESSION: 1. Borderline findings of possible acute cholecystitis. Consider RIGHT UPPER QUADRANT abdominal ultrasound to confirm or deny this possibility. 2. No acute abnormalities otherwise involving the abdomen or pelvis. 3. Geographic areas of hepatic steatosis involving the RIGHT lobe. No hepatic parenchymal masses. 4. Large wide-mouth diverticulum arising from the junction of the descending and transverse duodenum. 5. Diffuse colonic diverticulosis without evidence of acute diverticulitis. 6. Prior endovascular abdominal aortic aneurysm repair with aorto  bi-iliac stent grafts. The grafts are patent, though mural thrombus is present within the  aortic portion of the graft. Aortic Atherosclerosis (ICD10-I70.0). Electronically Signed   By: Evangeline Dakin M.D.   On: 04/17/2019 16:27   Mr Hip Left W/o Contrast  Result Date: 03/31/2019 CLINICAL DATA:  Left hip pain. History of left total hip arthroplasty in 2018. EXAM: MR OF THE LEFT HIP WITHOUT CONTRAST TECHNIQUE: Multiplanar, multisequence MR imaging was performed. No intravenous contrast was administered. Metal artifact reduction techniques were utilized. COMPARISON:  X-ray 03/06/2018.  MRI 04/26/2018 FINDINGS: Bones: There is susceptibility artifact related to left total hip arthroplasty. There is also extensive susceptibility artifact within the pelvis related to aortoiliac stent graft material. No acute fracture identified. No focal bone marrow signal abnormality is evident. No bone lesion. Mild degenerative changes of the right hip. Joint or bursal effusion Joint effusion: No periprosthetic fluid is evident. No right hip joint effusion. Bursae: No bursal fluid collections about the hip or pelvis. Muscles and tendons Muscles and tendons: Unremarkable muscle bulk and signal. Tendons are intact. Other findings Miscellaneous:   Extensive colonic diverticulosis. IMPRESSION: 1. No evidence of left total hip arthroplasty hardware complication. No periprosthetic fluid collection. 2. Extensive colonic diverticulosis. Electronically Signed   By: Davina Poke M.D.   On: 03/31/2019 17:27   Dg Chest Portable 1 View  Result Date: 04/17/2019 CLINICAL DATA:  Cough. EXAM: PORTABLE CHEST 1 VIEW COMPARISON:  11/10/2018 FINDINGS: Stable changes from prior CABG surgery. Cardiac silhouette is normal in size and configuration. No mediastinal or hilar masses. Lungs are hyperexpanded. Prominent bronchovascular markings most evident in the lower lungs. Minor linear scarring at the right lateral lung base, stable. Lungs are otherwise clear. No pleural effusion or pneumothorax. Skeletal structures are demineralized  but grossly intact. IMPRESSION: 1. No acute cardiopulmonary disease. 2. Hyperexpanded lungs suggests COPD. Stable changes from prior CABG surgery. Electronically Signed   By: Lajean Manes M.D.   On: 04/17/2019 14:49   Dg Ercp Biliary & Pancreatic Ducts  Result Date: 04/20/2019 CLINICAL DATA:  ERCP for common bile duct stone. EXAM: ERCP TECHNIQUE: Multiple spot images obtained with the fluoroscopic device and submitted for interpretation post-procedure. FLUOROSCOPY TIME:  Fluoroscopy Time:  1 minutes and 54 seconds Number of Acquired Spot Images: 0 COMPARISON:  None. FINDINGS: The patient has undergone reported ERCP for choledocholithiasis. There is contrast injection into the biliary tree. The patient likely underwent balloon sweep of the common bile duct. IMPRESSION: Status post ERCP as above. These images were submitted for radiologic interpretation only. Please see the procedural report for the amount of contrast and the fluoroscopy time utilized. Electronically Signed   By: Constance Holster M.D.   On: 04/20/2019 19:49   Mr Abdomen Mrcp Moise Boring Contast  Result Date: 04/18/2019 CLINICAL DATA:  Nausea and vomiting over 5 days. Fever. Leukocytosis. EXAM: MRI ABDOMEN WITHOUT AND WITH CONTRAST (INCLUDING MRCP) TECHNIQUE: Multiplanar multisequence MR imaging of the abdomen was performed both before and after the administration of intravenous contrast. Heavily T2-weighted images of the biliary and pancreatic ducts were obtained, and three-dimensional MRCP images were rendered by post processing. CONTRAST:  21mL GADAVIST GADOBUTROL 1 MMOL/ML IV SOLN COMPARISON:  Serve multiple CT scan from 04/17/2019 FINDINGS: Extensive artifact obscuring the abdominal aorta and surrounding regions, attributed to the patient's aorta bi-iliac stent grafts. Despite efforts by the technologist and patient, motion artifact is present on today's exam and could not be eliminated. This reduces exam sensitivity and specificity. Lower  chest:  Unremarkable Hepatobiliary: Large parts of the liver and biliary tree are obscured by artifact related to the patient's aorta bi-iliac stent graft. No compelling abnormal enhancement along the margins of the intrahepatic biliary tree. The gallbladder appears unremarkable. The extrahepatic biliary tree is obscured by artifact on most sequences, but does not appear dilated on the axial T2 sequences. No obvious focal hepatic lesion is observed. Pancreas:  Unremarkable where visible. Spleen:  Unremarkable Adrenals/Urinary Tract: Right kidney lower pole cyst is partially included. Much of the kidneys is obscured by metal artifact. Stomach/Bowel: Large duodenal diverticulum extending from the periampullary region through the proximal transverse diverticulum. This could actually represent a cluster of 2 or more diverticula. No compelling evidence of inflammation, much of this process is obscured by metal artifact. Descending and sigmoid colon diverticulosis. Vascular/Lymphatic:  Obscured by aorta bi-iliac stent graft. Other:  No supplemental non-categorized findings. Musculoskeletal: Mild dextroconvex lumbar rotary scoliosis. IMPRESSION: 1. Unfortunately much of the abdomen is obscured by the metal artifact from the patient's aorta bi-iliac stent graft. I do not see definite abnormal biliary dilatation nor are there findings of beading or irregularity along the bowel ducts to further suggest cholangitis, but sensitivity must be understood to be markedly reduced. Motion artifact plates are rule in obscuring findings as well. 2. No current gallbladder wall thickening is observed. 3. Large duodenal diverticulum or cluster of diverticular extending from the periampullary region to the distal transverse duodenum. 4. Descending and sigmoid colon diverticulosis. Electronically Signed   By: Van Clines M.D.   On: 04/18/2019 12:06   US Abdomen Limited Ruq  Result Date: 04/17/2019 CLINICAL DATA:  Right upper  quadrant pain for 6 days. EXAM: ULTRASOUND ABDOMEN LIMITED RIGHT UPPER QUADRANT COMPARISON:  CT angiogram of the abdomen and pelvis 12/26/2017. FINDINGS: Gallbladder: There is some sludge within the gallbladder. No gallstones or wall thickening visualized. No sonographic Murphy sign noted by sonographer. Common bile duct: Diameter: 0.5 cm Liver: Echogenicity is increased. No focal lesion. Portal vein is patent on color Doppler imaging with normal direction of blood flow towards the liver. Other: None. IMPRESSION: Gallbladder sludge.  Negative for stones or cholecystitis. Fatty infiltration of the liver. Electronically Signed   By: Inge Rise M.D.   On: 04/17/2019 13:24     Subjective:   Discharge Exam: Vitals:   04/21/19 0944 04/21/19 1128  BP: 116/73 112/66  Pulse: 85 67  Resp:  18  Temp: 98.6 F (37 C) 98.5 F (36.9 C)  SpO2: 93% 95%   Vitals:   04/21/19 0724 04/21/19 0824 04/21/19 0944 04/21/19 1128  BP: 120/74  116/73 112/66  Pulse: 67  85 67  Resp: 16   18  Temp: 98.2 F (36.8 C)  98.6 F (37 C) 98.5 F (36.9 C)  TempSrc: Oral  Oral Oral  SpO2: 93% 96% 93% 95%  Weight:      Height:        General: Pt is alert, awake, not in acute distress Cardiovascular: RRR, S1/S2 +, no rubs, no gallops Respiratory: CTA bilaterally, no wheezing, no rhonchi Abdominal: Soft, NT, ND, bowel sounds + Extremities: no edema, no cyanosis    The results of significant diagnostics from this hospitalization (including imaging, microbiology, ancillary and laboratory) are listed below for reference.     Microbiology: Recent Results (from the past 240 hour(s))  Culture, blood (routine x 2)     Status: Abnormal   Collection Time: 04/17/19 12:42 PM   Specimen: BLOOD  Result Value Ref Range Status  Specimen Description BLOOD RIGHT ANTECUBITAL  Final   Special Requests   Final    BOTTLES DRAWN AEROBIC AND ANAEROBIC Blood Culture results may not be optimal due to an excessive volume of  blood received in culture bottles   Culture  Setup Time   Final    GRAM NEGATIVE RODS ANAEROBIC BOTTLE ONLY CRITICAL VALUE NOTED.  VALUE IS CONSISTENT WITH PREVIOUSLY REPORTED AND CALLED VALUE.    Culture (A)  Final    KLEBSIELLA PNEUMONIAE SUSCEPTIBILITIES PERFORMED ON PREVIOUS CULTURE WITHIN THE LAST 5 DAYS. Performed at [facility], [place] [address] [place], [place] [address]    Report Status 04/20/2019 FINAL  Final  Culture, blood (routine x 2)     Status: Abnormal   Collection Time: 04/17/19 12:46 PM   Specimen: BLOOD  Result Value Ref Range Status   Specimen Description BLOOD LEFT ANTECUBITAL  Final   Special Requests   Final    BOTTLES DRAWN AEROBIC AND ANAEROBIC Blood Culture adequate volume   Culture  Setup Time   Final    GRAM NEGATIVE RODS IN BOTH AEROBIC AND ANAEROBIC BOTTLES CRITICAL RESULT CALLED TO, READ BACK BY AND VERIFIED WITH: PHRMD L SEAY @[id number]  04/18/19 BY S GEZAHEGN Performed at [facility], [place] [address], [place], [place] [address]    Culture KLEBSIELLA PNEUMONIAE (A)  Final   Report Status 04/20/2019 FINAL  Final   Organism ID, Bacteria KLEBSIELLA PNEUMONIAE  Final      Susceptibility   Klebsiella pneumoniae - MIC*    AMPICILLIN >=32 RESISTANT Resistant     CEFAZOLIN <=4 SENSITIVE Sensitive     CEFEPIME <=1 SENSITIVE Sensitive     CEFTAZIDIME <=1 SENSITIVE Sensitive     CEFTRIAXONE <=1 SENSITIVE Sensitive     CIPROFLOXACIN <=0.25 SENSITIVE Sensitive     GENTAMICIN <=1 SENSITIVE Sensitive     IMIPENEM <=0.25 SENSITIVE Sensitive     TRIMETH/SULFA <=20 SENSITIVE Sensitive     AMPICILLIN/SULBACTAM <=2 SENSITIVE Sensitive     PIP/TAZO <=4 SENSITIVE Sensitive     Extended ESBL NEGATIVE Sensitive     * KLEBSIELLA PNEUMONIAE  Blood Culture ID Panel (Reflexed)     Status: Abnormal   Collection Time: 04/17/19 12:46 PM  Result Value Ref Range Status   Enterococcus species NOT DETECTED NOT DETECTED Final   Listeria monocytogenes NOT DETECTED NOT  DETECTED Final   Staphylococcus species NOT DETECTED NOT DETECTED Final   Staphylococcus aureus (BCID) NOT DETECTED NOT DETECTED Final   Streptococcus species NOT DETECTED NOT DETECTED Final   Streptococcus agalactiae NOT DETECTED NOT DETECTED Final   Streptococcus pneumoniae NOT DETECTED NOT DETECTED Final   Streptococcus pyogenes NOT DETECTED NOT DETECTED Final   Acinetobacter baumannii NOT DETECTED NOT DETECTED Final   Enterobacteriaceae species DETECTED (A) NOT DETECTED Final    Comment: Enterobacteriaceae represent a large family of gram-negative bacteria, not a single organism. CRITICAL RESULT CALLED TO, READ BACK BY AND VERIFIED WITH: PHRMD L SEAY @0617  04/18/19 BY S GEZAHEGN    Enterobacter cloacae complex NOT DETECTED NOT DETECTED Final   Escherichia coli NOT DETECTED NOT DETECTED Final   Klebsiella oxytoca NOT DETECTED NOT DETECTED Final   Klebsiella pneumoniae DETECTED (A) NOT DETECTED Final    Comment: CRITICAL RESULT CALLED TO, READ BACK BY AND VERIFIED WITH: PHRMD L SEAY @0617  04/18/19 BY S GEZAHEGN    Proteus species NOT DETECTED NOT DETECTED Final   Serratia marcescens NOT DETECTED NOT DETECTED Final   Carbapenem resistance NOT  DETECTED NOT DETECTED Final   Haemophilus influenzae NOT DETECTED NOT DETECTED Final   Neisseria meningitidis NOT DETECTED NOT DETECTED Final   Pseudomonas aeruginosa NOT DETECTED NOT DETECTED Final   Candida albicans NOT DETECTED NOT DETECTED Final   Candida glabrata NOT DETECTED NOT DETECTED Final   Candida krusei NOT DETECTED NOT DETECTED Final   Candida parapsilosis NOT DETECTED NOT DETECTED Final   Candida tropicalis NOT DETECTED NOT DETECTED Final    Comment: Performed at [facility], [place] [address] [place], [place] [address]  SARS CORONAVIRUS 2 (TAT 6-24 HRS) Nasopharyngeal Nasopharyngeal Swab     Status: None   Collection Time: 04/17/19  5:01 PM   Specimen: Nasopharyngeal Swab  Result Value Ref Range Status   SARS Coronavirus 2  NEGATIVE NEGATIVE Final    Comment: (NOTE) SARS-CoV-2 target nucleic acids are NOT DETECTED. The SARS-CoV-2 RNA is generally detectable in upper and lower respiratory specimens during the acute phase of infection. Negative results do not preclude SARS-CoV-2 infection, do not rule out co-infections with other pathogens, and should not be used as the sole basis for treatment or other patient management decisions. Negative results must be combined with clinical observations, patient history, and epidemiological information. The expected result is Negative. Fact Sheet for Patients: SugarRoll.be Fact Sheet for Healthcare Providers: https://www.woods-mathews.com/ This test is not yet approved or cleared by the [place] FDA and  has been authorized for detection and/or diagnosis of SARS-CoV-2 by FDA under an Emergency Use Authorization (EUA). This EUA will remain  in effect (meaning this test can be used) for the duration of the COVID-19 declaration under Section 56 4(b)(1) of the Act, 21 U.S.C. section 360bbb-3(b)(1), unless the authorization is terminated or revoked sooner. Performed at [facility], [place] [address] [place], [place] [address]   Surgical PCR screen     Status: None   Collection Time: 04/19/19 12:49 PM   Specimen: Nasal Mucosa; Nasal Swab  Result Value Ref Range Status   MRSA, PCR NEGATIVE NEGATIVE Final   Staphylococcus aureus NEGATIVE NEGATIVE Final    Comment: (NOTE) The Xpert SA Assay (FDA approved for NASAL specimens in patients 42 years of age and older), is one component of a comprehensive surveillance program. It is not intended to diagnose infection nor to guide or monitor treatment. Performed at [facility], [place] [address] [place], [place] [address]      Labs: BNP (last 3 results) Recent Labs    11/10/18 1602  BNP 87   Basic Metabolic Panel: Recent Labs  Lab 04/17/19 1132 04/18/19 0350  04/19/19 0718 04/20/19 0457 04/21/19 0349  NA 137 137 136 136 136  K 3.9 3.7 3.5 4.1 3.7  CL 100 99 95* 95* 96*  CO2 26 24 28 26 28   GLUCOSE 138* 109* 94 159* 138*  BUN 15 8 9 11 11   CREATININE 0.89 0.72 0.79 0.84 0.71  CALCIUM 9.2 8.6* 8.9 8.7* 8.7*   Liver Function Tests: Recent Labs  Lab 04/17/19 1132 04/18/19 0350 04/19/19 0718 04/20/19 0457 04/21/19 0349  AST 519* 205* 79* 71* 80*  ALT 223* 146* 100* 75* 73*  ALKPHOS 280* 243* 222* 169* 189*  BILITOT 5.5* 3.5* 2.1* 1.3* 0.9  PROT 7.4 6.3* 7.0 6.2* 6.1*  ALBUMIN 3.9 3.4* 3.6 3.2* 3.1*   Recent Labs  Lab 04/17/19 1132 04/20/19 0457  LIPASE 17 17   No results for input(s): AMMONIA in the last 168 hours. CBC: Recent Labs  Lab  04/17/19 1132 04/18/19 0350 04/19/19 0718 04/20/19 0457 04/21/19 0349  WBC 13.2* 7.6 7.8 13.5* 10.6*  HGB 17.3* 14.8 16.1 15.0 14.4  HCT 49.8 44.2 46.4 42.6 41.5  MCV 94.0 96.5 93.2 93.0 93.0  PLT 284 237 255 249 256   Cardiac Enzymes: No results for input(s): CKTOTAL, CKMB, CKMBINDEX, TROPONINI in the last 168 hours. BNP: Invalid input(s): POCBNP CBG: Recent Labs  Lab 04/20/19 0702  GLUCAP 135*   D-Dimer No results for input(s): DDIMER in the last 72 hours. Hgb A1c No results for input(s): HGBA1C in the last 72 hours. Lipid Profile No results for input(s): CHOL, HDL, LDLCALC, TRIG, CHOLHDL, LDLDIRECT in the last 72 hours. Thyroid function studies No results for input(s): TSH, T4TOTAL, T3FREE, THYROIDAB in the last 72 hours.  Invalid input(s): FREET3 Anemia work up No results for input(s): VITAMINB12, FOLATE, FERRITIN, TIBC, IRON, RETICCTPCT in the last 72 hours. Urinalysis    Component Value Date/Time   COLORURINE AMBER (A) 04/17/2019 1224   APPEARANCEUR CLEAR 04/17/2019 1224   LABSPEC 1.020 04/17/2019 1224   PHURINE 5.0 04/17/2019 1224   GLUCOSEU NEGATIVE 04/17/2019 1224   HGBUR NEGATIVE 04/17/2019 1224   BILIRUBINUR MODERATE (A) 04/17/2019 1224   BILIRUBINUR  negative 11/08/2018 1022   KETONESUR 5 (A) 04/17/2019 1224   PROTEINUR NEGATIVE 04/17/2019 1224   UROBILINOGEN 0.2 11/08/2018 1022   UROBILINOGEN 1.0 02/06/2014 1436   NITRITE NEGATIVE 04/17/2019 1224   LEUKOCYTESUR NEGATIVE 04/17/2019 1224   Sepsis Labs Invalid input(s): PROCALCITONIN,  WBC,  LACTICIDVEN Microbiology Recent Results (from the past 240 hour(s))  Culture, blood (routine x 2)     Status: Abnormal   Collection Time: 04/17/19 12:42 PM   Specimen: BLOOD  Result Value Ref Range Status   Specimen Description BLOOD RIGHT ANTECUBITAL  Final   Special Requests   Final    BOTTLES DRAWN AEROBIC AND ANAEROBIC Blood Culture results may not be optimal due to an excessive volume of blood received in culture bottles   Culture  Setup Time   Final    GRAM NEGATIVE RODS ANAEROBIC BOTTLE ONLY CRITICAL VALUE NOTED.  VALUE IS CONSISTENT WITH PREVIOUSLY REPORTED AND CALLED VALUE.    Culture (A)  Final    KLEBSIELLA PNEUMONIAE SUSCEPTIBILITIES PERFORMED ON PREVIOUS CULTURE WITHIN THE LAST 5 DAYS. Performed at [facility], [place] [address] [place], [place] [address]    Report Status 04/20/2019 FINAL  Final  Culture, blood (routine x 2)     Status: Abnormal   Collection Time: 04/17/19 12:46 PM   Specimen: BLOOD  Result Value Ref Range Status   Specimen Description BLOOD LEFT ANTECUBITAL  Final   Special Requests   Final    BOTTLES DRAWN AEROBIC AND ANAEROBIC Blood Culture adequate volume   Culture  Setup Time   Final    GRAM NEGATIVE RODS IN BOTH AEROBIC AND ANAEROBIC BOTTLES CRITICAL RESULT CALLED TO, READ BACK BY AND VERIFIED WITH: PHRMD L SEAY @0617  04/18/19 BY S GEZAHEGN Performed at [facility], [place] [address], [place], [place] [address]    Culture KLEBSIELLA PNEUMONIAE (A)  Final   Report Status 04/20/2019 FINAL  Final   Organism ID, Bacteria KLEBSIELLA PNEUMONIAE  Final      Susceptibility   Klebsiella pneumoniae - MIC*    AMPICILLIN >=32 RESISTANT Resistant      CEFAZOLIN <=4 SENSITIVE Sensitive     CEFEPIME <=1 SENSITIVE Sensitive     CEFTAZIDIME <=1 SENSITIVE Sensitive     CEFTRIAXONE <=  1 SENSITIVE Sensitive     CIPROFLOXACIN <=0.25 SENSITIVE Sensitive     GENTAMICIN <=1 SENSITIVE Sensitive     IMIPENEM <=0.25 SENSITIVE Sensitive     TRIMETH/SULFA <=20 SENSITIVE Sensitive     AMPICILLIN/SULBACTAM <=2 SENSITIVE Sensitive     PIP/TAZO <=4 SENSITIVE Sensitive     Extended ESBL NEGATIVE Sensitive     * KLEBSIELLA PNEUMONIAE  Blood Culture ID Panel (Reflexed)     Status: Abnormal   Collection Time: 04/17/19 12:46 PM  Result Value Ref Range Status   Enterococcus species NOT DETECTED NOT DETECTED Final   Listeria monocytogenes NOT DETECTED NOT DETECTED Final   Staphylococcus species NOT DETECTED NOT DETECTED Final   Staphylococcus aureus (BCID) NOT DETECTED NOT DETECTED Final   Streptococcus species NOT DETECTED NOT DETECTED Final   Streptococcus agalactiae NOT DETECTED NOT DETECTED Final   Streptococcus pneumoniae NOT DETECTED NOT DETECTED Final   Streptococcus pyogenes NOT DETECTED NOT DETECTED Final   Acinetobacter baumannii NOT DETECTED NOT DETECTED Final   Enterobacteriaceae species DETECTED (A) NOT DETECTED Final    Comment: Enterobacteriaceae represent a large family of gram-negative bacteria, not a single organism. CRITICAL RESULT CALLED TO, READ BACK BY AND VERIFIED WITH: PHRMD L SEAY @0617  04/18/19 BY S GEZAHEGN    Enterobacter cloacae complex NOT DETECTED NOT DETECTED Final   Escherichia coli NOT DETECTED NOT DETECTED Final   Klebsiella oxytoca NOT DETECTED NOT DETECTED Final   Klebsiella pneumoniae DETECTED (A) NOT DETECTED Final    Comment: CRITICAL RESULT CALLED TO, READ BACK BY AND VERIFIED WITH: PHRMD L SEAY @0617  04/18/19 BY S GEZAHEGN    Proteus species NOT DETECTED NOT DETECTED Final   Serratia marcescens NOT DETECTED NOT DETECTED Final   Carbapenem resistance NOT DETECTED NOT DETECTED Final   Haemophilus influenzae NOT  DETECTED NOT DETECTED Final   Neisseria meningitidis NOT DETECTED NOT DETECTED Final   Pseudomonas aeruginosa NOT DETECTED NOT DETECTED Final   Candida albicans NOT DETECTED NOT DETECTED Final   Candida glabrata NOT DETECTED NOT DETECTED Final   Candida krusei NOT DETECTED NOT DETECTED Final   Candida parapsilosis NOT DETECTED NOT DETECTED Final   Candida tropicalis NOT DETECTED NOT DETECTED Final    Comment: Performed at [facility], [place] [address], [place], [place] [address]  SARS CORONAVIRUS 2 (TAT 6-24 HRS) Nasopharyngeal Nasopharyngeal Swab     Status: None   Collection Time: 04/17/19  5:01 PM   Specimen: Nasopharyngeal Swab  Result Value Ref Range Status   SARS Coronavirus 2 NEGATIVE NEGATIVE Final    Comment: (NOTE) SARS-CoV-2 target nucleic acids are NOT DETECTED. The SARS-CoV-2 RNA is generally detectable in upper and lower respiratory specimens during the acute phase of infection. Negative results do not preclude SARS-CoV-2 infection, do not rule out co-infections with other pathogens, and should not be used as the sole basis for treatment or other patient management decisions. Negative results must be combined with clinical observations, patient history, and epidemiological information. The expected result is Negative. Fact Sheet for Patients: SugarRoll.be Fact Sheet for Healthcare Providers: https://www.woods-mathews.com/ This test is not yet approved or cleared by the [place] FDA and  has been authorized for detection and/or diagnosis of SARS-CoV-2 by FDA under an Emergency Use Authorization (EUA). This EUA will remain  in effect (meaning this test can be used) for the duration of the COVID-19 declaration under Section 56 4(b)(1) of the Act, 21 U.S.C. section 360bbb-3(b)(1), unless the authorization is terminated or revoked sooner. Performed at  [facility], [place] [address], [place], [place] [address]    Surgical PCR screen     Status: None   Collection Time: 04/19/19 12:49 PM   Specimen: Nasal Mucosa; Nasal Swab  Result Value Ref Range Status   MRSA, PCR NEGATIVE NEGATIVE Final   Staphylococcus aureus NEGATIVE NEGATIVE Final    Comment: (NOTE) The Xpert SA Assay (FDA approved for NASAL specimens in patients 53 years of age and older), is one component of a comprehensive surveillance program. It is not intended to diagnose infection nor to guide or monitor treatment. Performed at [facility], [place] [address] [place], [place] [address]     Time coordinating discharge: 35 minutes  SIGNED:  Ivor Costa, DO [facility] 04/21/2019, 1:07 PM Pager is on [place]  If 7PM-7AM, please contact night-coverage [email or web address] Password [email or web address]

## 2019-04-21 NOTE — Anesthesia Postprocedure Evaluation (Signed)
Anesthesia Post Note  Patient: Carl Lee  Procedure(s) Performed: ENDOSCOPIC RETROGRADE CHOLANGIOPANCREATOGRAPHY (ERCP) (N/A ) SPHINCTEROTOMY BILIARY DILATION REMOVAL OF STONES BALLOON DILATION (N/A )     Patient location during evaluation: PACU Anesthesia Type: General Level of consciousness: awake and alert Pain management: pain level controlled Vital Signs Assessment: post-procedure vital signs reviewed and stable Respiratory status: spontaneous breathing, nonlabored ventilation, respiratory function stable and patient connected to nasal cannula oxygen Cardiovascular status: blood pressure returned to baseline and stable Postop Assessment: no apparent nausea or vomiting Anesthetic complications: no    Last Vitals:  Vitals:   04/21/19 0552 04/21/19 0724  BP: 125/77 120/74  Pulse: 71 67  Resp: 18 16  Temp: 36.4 C 36.8 C  SpO2: 94% 93%    Last Pain:  Vitals:   04/21/19 0724  TempSrc: Oral  PainSc:                  Carl Lee S

## 2019-04-21 NOTE — Progress Notes (Signed)
1 Day Post-Op    CC: Right upper quadrant abdominal pain, nausea and vomiting x5 days.  Subjective: He is feeling much better today.  He said last night was first night he has not hurt in weeks.  Port sites all look good.  He has a heart healthy diet ordered but right now he just has clears.  Nursing status, see about giving him some real food.  Objective: Vital signs in last 24 hours: Temp:  [97.6 F (36.4 C)-98.2 F (36.8 C)] 98.2 F (36.8 C) (10/10 0724) Pulse Rate:  [67-102] 67 (10/10 0724) Resp:  [15-25] 16 (10/10 0724) BP: (120-169)/(74-100) 120/74 (10/10 0724) SpO2:  [90 %-96 %] 93 % (10/10 0724) Weight:  [89.7 kg-90.9 kg] 89.7 kg (10/10 0552) Last BM Date: 04/17/19 480 PO 1100 IV 1900 urine Afebrile vital signs are stable CMP stable LFTs improving CMP Latest Ref Rng & Units 04/21/2019 04/20/2019 04/19/2019  Total Bilirubin 0.3 - 1.2 mg/dL 0.9 1.3(H) 2.1(H)  Alkaline Phos 38 - 126 U/L 189(H) 169(H) 222(H)  AST 15 - 41 U/L 80(H) 71(H) 79(H)  ALT 0 - 44 U/L 73(H) 75(H) 100(H)   WBC 10.6 and improving. Intake/Output from previous day: 10/09 0701 - 10/10 0700 In: [id number] [P.O.:480; I.V.:1000; IV Piggyback:100] Out: 1900 [Urine:1900] Intake/Output this shift: No intake/output data recorded.  General appearance: alert, cooperative and no distress Resp: clear to auscultation bilaterally GI: Soft, sore, port sites all look good.  Lab Results:  Recent Labs    04/20/19 0457 04/21/19 0349  WBC 13.5* 10.6*  HGB 15.0 14.4  HCT 42.6 41.5  PLT 249 256    BMET Recent Labs    04/20/19 0457 04/21/19 0349  NA 136 136  K 4.1 3.7  CL 95* 96*  CO2 26 28  GLUCOSE 159* 138*  BUN 11 11  CREATININE 0.84 0.71  CALCIUM 8.7* 8.7*   PT/INR No results for input(s): LABPROT, INR in the last 72 hours.  Recent Labs  Lab 04/17/19 1132 04/18/19 0350 04/19/19 0718 04/20/19 0457 04/21/19 0349  AST 519* 205* 79* 71* 80*  ALT 223* 146* 100* 75* 73*  ALKPHOS 280* 243* 222*  169* 189*  BILITOT 5.5* 3.5* 2.1* 1.3* 0.9  PROT 7.4 6.3* 7.0 6.2* 6.1*  ALBUMIN 3.9 3.4* 3.6 3.2* 3.1*     Lipase     Component Value Date/Time   LIPASE 17 04/20/2019 0457     Medications: . amLODipine  5 mg Oral Daily  . metoprolol tartrate  12.5 mg Oral BID  . mupirocin ointment  1 application Nasal BID  . nicotine  21 mg Transdermal Daily  . pantoprazole  40 mg Oral Daily  . tamsulosin  0.4 mg Oral QPC supper  . umeclidinium-vilanterol  1 puff Inhalation Daily    Assessment/Plan Klebsiella bacteremia - Ancef x 2 weeks Hypertension Prediabetes COPD/45-pack-year history tobacco/quit 2015 Hx bladder cancer Hx CAD status post CABG 2015, Hx PAT  -Cardiac clearance 04/19/2019 Dr. Skeet Latch Hx abdominal aortic aneurysm repair with endograft Sleep apnea on BiPAP at home Remote history of migraines none since 2015 Chronic back pain Zenker's diverticulum  - is scheduled for cricopharyngeal myotomy at [facility] Left hip replacement   Abdominal pain, nausea and vomiting x5 days 1. Laparoscopic cholecystectomy with intraoperative cholangiogram 04/19/2019, DR. [name]  POD #2 2. Duodenal stricture s/p dilatation.;Choledocholithiasis s/p biliary sphincterotomy with stone extraction.,Multiple large duodenal diverticula, 04/20/2019, Dr. [name]  - positive blood culture Enterobacteriaceae, and Klebsiella Pneumoniae Gallstones noted on CT of  the chest obtained 11/13/2018 -Elevated LFTs/fatty infiltration liver -Ultrasound negative for stones/positive for sludge/negative for cholecystitis/CBD 0.5 cm -CT scan: Mild distended gallbladder/diffuse colonic diverticulosis without acute diverticulitis/[place] diverticulum at the junction of the descending and transverse duodenum -Hx diverticulosis/Zenker's diverticulum   FEN: IV fluids/Heart healthy ID: Zosyn 10/6 - 10/9; Ancef 10/9 >> day 2 DVT: SCDs Follow-up: TBD  Plan: From a surgical standpoint he is ready  for discharge.  He has follow-up appointment and discharge instructions in the AVS.  He is unclear about what he can take for pain.  Will defer to Medicine.  We will see again as needed, please call if we can be of any further assistance.      LOS: 4 days    Damond Borchers 04/21/2019 [phone]

## 2019-04-22 ENCOUNTER — Encounter (HOSPITAL_COMMUNITY): Payer: Self-pay | Admitting: Gastroenterology

## 2019-04-23 ENCOUNTER — Telehealth: Payer: Self-pay | Admitting: *Deleted

## 2019-04-23 LAB — SURGICAL PATHOLOGY

## 2019-04-23 NOTE — Telephone Encounter (Signed)
Transition Care Management Follow-up Telephone Call   Date discharged? 04/21/19   How have you been since you were released from the hospital? Pt states he seem to be doing ok.. still recooperating   Do you understand why you were in the hospital? YES   Do you understand the discharge instructions? YES   Where were you discharged to? HOME   Items Reviewed:  Medications reviewed: YES, pt states no changes  Allergies reviewed: YES  Dietary changes reviewed: YES, low fat & heart healthy  Referrals reviewed: Pt states he will call and make follow up appt w/surgeon and GI provider    Functional Questionnaire:   Activities of Daily Living (ADLs):   He states he are independent in the following: ambulation, bathing and hygiene, feeding, continence, grooming, toileting and dressing States he doesn't;t require assistance    Any transportation issues/concerns?: NO   Any patient concerns? NO   Confirmed importance and date/time of follow-up visits scheduled YES, appt 05/09/19  Provider Appointment booked with Dr. Danise Mina  Confirmed with patient if condition begins to worsen call PCP or go to the ER.  Patient was given the office number and encouraged to call back with question or concerns.  : YES

## 2019-04-24 NOTE — Telephone Encounter (Signed)
Spoke with Carl Lee, ok per DPR on file, and rescheduled to 05/01/2019

## 2019-04-24 NOTE — Telephone Encounter (Signed)
plz call and offer sooner appt in 30 min slot with me for hospital f/u. Needs within 2 wks of discharge (04/21/2019).

## 2019-04-25 ENCOUNTER — Telehealth: Payer: Self-pay

## 2019-04-25 ENCOUNTER — Telehealth: Payer: Self-pay | Admitting: Cardiology

## 2019-04-25 DIAGNOSIS — G4733 Obstructive sleep apnea (adult) (pediatric): Secondary | ICD-10-CM

## 2019-04-25 NOTE — Telephone Encounter (Signed)
Decrease BiPAP to 13/9cm H2O and get a download in 2 weeks

## 2019-04-25 NOTE — Telephone Encounter (Signed)
New message     Patient states that his CPAP machine is pumping out too much air.  He cannot use it.  Please advise.

## 2019-04-25 NOTE — Telephone Encounter (Signed)
Difficult situation given liver function elevated do want to avoid too much tylenol, and given heart history want to avoid too much NSAIDs.  Would suggest heating pad covered in towel to abdomen to see if any improvement.  If no improvement in pain, could try 1-2 tab at a time of tylenol 325mg  or ibuprofen 200mg  twice daily (low dose).

## 2019-04-25 NOTE — Telephone Encounter (Signed)
Pt recently had gallbladder surgery and removal of gall stones which according to pt the stones caused a blood infection and pt was told by doctor that did GB surgery he could take Tylenol but Dr Lyndel Safe told pt he could not take Tylenol. Pt does not know what to do for dull constant pain at bell button and sharp pain on rt upper abd under ribs especially when takes deep breath. Pt has appt with Dr Rush Farmer as FU but does not want to call another doctor pt wants Dr Darnell Level opinion if he can take Tylenol or what does Dr Darnell Level suggest pt take for pain. Pt has HFU with Dr Darnell Level on 05/01/19. Pt request cb after Dr Darnell Level reviews. [facility].

## 2019-04-26 NOTE — Telephone Encounter (Signed)
Spoke with pt relaying Dr. Synthia Innocent message. Pt verbalizes understanding.  Says pain is improving daily, just bothersome during sleep.

## 2019-04-26 NOTE — Telephone Encounter (Signed)
Order placed to [facility] via community message. 

## 2019-04-30 NOTE — Anesthesia Postprocedure Evaluation (Signed)
Anesthesia Post Note  Patient: Carl Lee  Procedure(s) Performed: LAPAROSCOPIC CHOLECYSTECTOMY (N/A Abdomen) Intraoperative Cholangiogram (N/A Abdomen)     Patient location during evaluation: PACU Anesthesia Type: General Level of consciousness: awake and alert Pain management: pain level controlled Vital Signs Assessment: post-procedure vital signs reviewed and stable Respiratory status: spontaneous breathing, nonlabored ventilation, respiratory function stable and patient connected to nasal cannula oxygen Cardiovascular status: blood pressure returned to baseline and stable Postop Assessment: no apparent nausea or vomiting Anesthetic complications: no    Last Vitals:  Vitals:   04/21/19 0944 04/21/19 1128  BP: 116/73 112/66  Pulse: 85 67  Resp:  18  Temp: 37 C 36.9 C  SpO2: 93% 95%    Last Pain:  Vitals:   04/21/19 1128  TempSrc: Oral  PainSc: 0-No pain   Pain Goal: Patients Stated Pain Goal: 0 (04/19/19 1818)                 Montez Hageman

## 2019-05-01 ENCOUNTER — Ambulatory Visit (INDEPENDENT_AMBULATORY_CARE_PROVIDER_SITE_OTHER): Payer: PPO | Admitting: Family Medicine

## 2019-05-01 ENCOUNTER — Other Ambulatory Visit: Payer: Self-pay

## 2019-05-01 ENCOUNTER — Encounter: Payer: Self-pay | Admitting: Family Medicine

## 2019-05-01 ENCOUNTER — Telehealth: Payer: Self-pay | Admitting: Gastroenterology

## 2019-05-01 VITALS — BP 118/66 | HR 75 | Temp 97.8°F | Ht 70.0 in | Wt 202.2 lb

## 2019-05-01 DIAGNOSIS — K225 Diverticulum of esophagus, acquired: Secondary | ICD-10-CM | POA: Diagnosis not present

## 2019-05-01 DIAGNOSIS — K8050 Calculus of bile duct without cholangitis or cholecystitis without obstruction: Secondary | ICD-10-CM | POA: Diagnosis not present

## 2019-05-01 DIAGNOSIS — K8042 Calculus of bile duct with acute cholecystitis without obstruction: Secondary | ICD-10-CM | POA: Diagnosis not present

## 2019-05-01 DIAGNOSIS — K219 Gastro-esophageal reflux disease without esophagitis: Secondary | ICD-10-CM

## 2019-05-01 LAB — CBC WITH DIFFERENTIAL/PLATELET
Basophils Absolute: 0.1 10*3/uL (ref 0.0–0.1)
Basophils Relative: 1.2 % (ref 0.0–3.0)
Eosinophils Absolute: 0.3 10*3/uL (ref 0.0–0.7)
Eosinophils Relative: 2.8 % (ref 0.0–5.0)
HCT: 47.2 % (ref 39.0–52.0)
Hemoglobin: 15.7 g/dL (ref 13.0–17.0)
Lymphocytes Relative: 25.1 % (ref 12.0–46.0)
Lymphs Abs: 2.6 10*3/uL (ref 0.7–4.0)
MCHC: 33.4 g/dL (ref 30.0–36.0)
MCV: 94.1 fl (ref 78.0–100.0)
Monocytes Absolute: 0.9 10*3/uL (ref 0.1–1.0)
Monocytes Relative: 9 % (ref 3.0–12.0)
Neutro Abs: 6.4 10*3/uL (ref 1.4–7.7)
Neutrophils Relative %: 61.9 % (ref 43.0–77.0)
Platelets: 439 10*3/uL — ABNORMAL HIGH (ref 150.0–400.0)
RBC: 5.01 Mil/uL (ref 4.22–5.81)
RDW: 13.9 % (ref 11.5–15.5)
WBC: 10.3 10*3/uL (ref 4.0–10.5)

## 2019-05-01 LAB — COMPREHENSIVE METABOLIC PANEL
ALT: 10 U/L (ref 0–53)
AST: 14 U/L (ref 0–37)
Albumin: 4.1 g/dL (ref 3.5–5.2)
Alkaline Phosphatase: 113 U/L (ref 39–117)
BUN: 16 mg/dL (ref 6–23)
CO2: 30 mEq/L (ref 19–32)
Calcium: 9.5 mg/dL (ref 8.4–10.5)
Chloride: 102 mEq/L (ref 96–112)
Creatinine, Ser: 0.69 mg/dL (ref 0.40–1.50)
GFR: 115.04 mL/min (ref >60.00)
Glucose, Bld: 108 mg/dL — ABNORMAL HIGH (ref 70–99)
Potassium: 4.6 mEq/L (ref 3.5–5.1)
Sodium: 139 mEq/L (ref 135–145)
Total Bilirubin: 1.1 mg/dL (ref 0.2–1.2)
Total Protein: 6.5 g/dL (ref 6.0–8.3)

## 2019-05-01 NOTE — Assessment & Plan Note (Addendum)
Recovering well s/p lap chole and finishing oral keflex today for total 2 wk abx course. hospitalization reviewed with patient. He will f/u with GI and gen surgery. Requests our assistance to schedule GI f/u Ardis Hughs). Update labs to ensure tranasminitis resolving.

## 2019-05-01 NOTE — Assessment & Plan Note (Signed)
S/p ERCP

## 2019-05-01 NOTE — Telephone Encounter (Signed)
Patient just had hes gallbladder removed 2 weeks ago and he is having complications he stated. He needs a F/U appt but the next day Dr. Ardis Hughs has is not until 12/1 and he states he needs to be soon.

## 2019-05-01 NOTE — Telephone Encounter (Signed)
I spoke with the pt and he seems very confused.  He tells me he needs a release from Dr Ardis Hughs to give to [facility] so that he can have cricopharyngeal myotomy.  He says he had "surgery" with Dr Lyndel Safe and Rob Hickman wont schedule now.  Dr Lyndel Safe did ERCP and Dr Harl Bowie "I believe" did his gallbladder removal.  I asked him to have [facility] contact our office so that I can determine exactly what he needs.  He also told me he wants an appt with Dr Ardis Hughs.  I scheduled him an appt for 12/1.  Dr Jac Canavan not sure if there is anything we need to do.

## 2019-05-01 NOTE — Assessment & Plan Note (Signed)
Discussed omeprazole vs pantoprazole - he will let us know when running out of pantoprazole.

## 2019-05-01 NOTE — Progress Notes (Signed)
This visit was conducted in person.  BP 118/66 (BP Location: Left Arm, Patient Position: Sitting, Cuff Size: Normal)   Pulse 75   Temp 97.8 F (36.6 C) (Temporal)   Ht 5\' 10"  (1.778 m)   Wt 202 lb 3 oz (91.7 kg)   SpO2 97%   BMI 29.01 kg/m    CC: TCM hosp f/u visit Subjective:    Patient ID: Carl Lee, male    DOB: Nov 12, 1955, 63 y.o.   MRN: [id number]  HPI: Carl Lee is a 63 y.o. male presenting on 05/01/2019 for Hospitalization Follow-up   Recent hospitalization for acute RUQ abdominal pain associated with nausea vomiting and fever. Seen at ER, found to have acute cholecystitis by CT scan. Treated with IVF, dilaudid, zosyn. Underwent lap cholecystectomy [name]) on 04/19/2019. Blood cultures showed klebsiella bacteria - changed to ancef, discharged on keflex to complete 2 wk antibiotic course. He had ERCP performed by Dr Lyndel Safe showing duodenal stricture s/p dilatation, as well as choledocholithiasis s/p biliary sphincterectomy with stone extraction. Multiple large duodenal diverticula were also found.   Since home slowly recovering. Still feels severe fatigued and mild abdominal discomfort.   F/u gen surgery (end of month) and GI (needs to schedule).  Omeprazole changed to pantoprazole in hospital.    Admit date: 04/17/2019 Discharge date: 04/21/2019 TCM hosp f/u phone call completed 04/23/2019  Discharge Diagnoses and Hospital Course:  Principal Problem:   Acute cholecystitis Active Problems:   Hypertension   History of bladder cancer   History of AAA (abdominal aortic aneurysm) repair   Dyslipidemia   COPD, moderate (HCC)   OSA (obstructive sleep apnea)   Coronary artery disease   GERD (gastroesophageal reflux disease)   Hyperbilirubinemia   Prediabetes   Elevated transaminase level  Recommendations for Outpatient Follow-up:  1. Follow up with PCP in 1-2 weeks 2. Please obtain BMP/CBC  3. Please follow up with orthopedic surgeon, gastroenterologist  and your PCP  Home Health: none Equipment/Devices: none  Discharge Condition: stable CODE STATUS: fall Diet recommendation: low fat heart health diet     Relevant past medical, surgical, family and social history reviewed and updated as indicated. Interim medical history since our last visit reviewed. Allergies and medications reviewed and updated. Outpatient Medications Prior to Visit  Medication Sig Dispense Refill  . albuterol (PROAIR HFA) 108 (90 Base) MCG/ACT inhaler Inhale 2 puffs into the lungs every 4 (four) hours as needed for wheezing or shortness of breath. 1 g 3  . amLODipine (NORVASC) 5 MG tablet Take 1 tablet (5 mg total) by mouth daily. 30 tablet 1  . aspirin EC 81 MG tablet Take 1 tablet (81 mg total) by mouth daily. 90 tablet 1  . atorvastatin (LIPITOR) 20 MG tablet TAKE 1 TABLET (20 MG TOTAL) BY MOUTH DAILY AT 6 PM. 90 tablet 3  . cephALEXin (KEFLEX) 500 MG capsule Take 1 capsule (500 mg total) by mouth 3 (three) times daily for 10 days. 30 capsule 0  . cholecalciferol (VITAMIN D3) 25 MCG (1000 UT) tablet Take 1,000 Units by mouth daily.    . fluticasone furoate-vilanterol (BREO ELLIPTA) 100-25 MCG/INH AEPB Inhale 1 puff into the lungs daily.    [name] glucose blood (ONE TOUCH ULTRA TEST) test strip CHECK ONCE DAILY AND AS NEEDED    . Guaifenesin (MUCINEX MAXIMUM STRENGTH) 1200 MG TB12 Take 1 tablet (1,200 mg total) by mouth 2 (two) times daily. 14 tablet 0  . ipratropium-albuterol (DUONEB) 0.5-2.5 (3) MG/3ML SOLN Take  3 mLs by nebulization every 6 (six) hours as needed. 360 mL 0  . metoprolol tartrate (LOPRESSOR) 25 MG tablet TAKE 0.5 TABLETS (12.5 MG TOTAL) BY MOUTH 2 (TWO) TIMES DAILY. 90 tablet 3  . pantoprazole (PROTONIX) 40 MG tablet Take 1 tablet (40 mg total) by mouth daily. 30 tablet 2  . tamsulosin (FLOMAX) 0.4 MG CAPS capsule Take 1 capsule (0.4 mg total) by mouth daily after supper. 30 capsule 1  . vitamin B-12 (CYANOCOBALAMIN) 500 MCG tablet Take 1 tablet (500  mcg total) by mouth every other day. (Patient taking differently: Take 500 mcg by mouth every other day. Took on Monday)    . acetaminophen (TYLENOL) 325 MG tablet Take 2 tablets (650 mg total) by mouth every 6 (six) hours as needed for moderate pain or headache. (Patient not taking: Reported on 05/01/2019) 60 tablet 0  . famotidine (PEPCID) 20 MG tablet Take 1 tablet (20 mg total) by mouth 2 (two) times daily. (Patient not taking: Reported on 05/01/2019) 60 tablet 1  . Glycopyrrolate-Formoterol (BEVESPI AEROSPHERE) 9-4.8 MCG/ACT AERO Inhale 2 puffs into the lungs 2 (two) times daily. (Patient not taking: Reported on 05/01/2019) 5.9 g 3  . ketotifen (ZADITOR) 0.025 % ophthalmic solution Place 1 drop into both eyes daily as needed (allergies). (Patient not taking: Reported on 05/01/2019) 5 mL 1  . ondansetron (ZOFRAN) 4 MG tablet Take 1 tablet (4 mg total) by mouth every 6 (six) hours as needed for nausea. (Patient not taking: Reported on 05/01/2019) 20 tablet 0  . nicotine (NICODERM CQ - DOSED IN MG/24 HOURS) 21 mg/24hr patch Place 1 patch (21 mg total) onto the skin daily. 28 patch 0   No facility-administered medications prior to visit.      Per HPI unless specifically indicated in ROS section below Review of Systems Objective:    BP 118/66 (BP Location: Left Arm, Patient Position: Sitting, Cuff Size: Normal)   Pulse 75   Temp 97.8 F (36.6 C) (Temporal)   Ht 5\' 10"  (1.778 m)   Wt 202 lb 3 oz (91.7 kg)   SpO2 97%   BMI 29.01 kg/m   Wt Readings from Last 3 Encounters:  05/01/19 202 lb 3 oz (91.7 kg)  04/21/19 197 lb 12.8 oz (89.7 kg)  04/11/19 208 lb 12.8 oz (94.7 kg)    Physical Exam Vitals signs and nursing note reviewed.  Constitutional:      General: He is not in acute distress.    Appearance: Normal appearance. He is not ill-appearing.  HENT:     Mouth/Throat:     Mouth: Mucous membranes are moist.     Pharynx: Oropharynx is clear. No posterior oropharyngeal erythema.   Eyes:     Extraocular Movements: Extraocular movements intact.     Pupils: Pupils are equal, round, and reactive to light.  Cardiovascular:     Rate and Rhythm: Normal rate and regular rhythm.     Pulses: Normal pulses.     Heart sounds: Normal heart sounds. No murmur.  Pulmonary:     Effort: Pulmonary effort is normal. No respiratory distress.     Breath sounds: Normal breath sounds. No wheezing, rhonchi or rales.  Abdominal:     General: Abdomen is flat. Bowel sounds are normal. There is no distension.     Palpations: Abdomen is soft. There is no mass.     Tenderness: There is abdominal tenderness (mod) in the epigastric area. There is no guarding or rebound. Negative signs include Murphy's  sign.     Hernia: No hernia is present.  Musculoskeletal:     Right lower leg: No edema.     Left lower leg: No edema.  Skin:    Findings: No rash.  Neurological:     Mental Status: He is alert.  Psychiatric:        Mood and Affect: Mood normal.        Behavior: Behavior normal.       Lab Results  Component Value Date   TSH 2.20 07/13/2018    Lab Results  Component Value Date   CREATININE 0.71 04/21/2019   BUN 11 04/21/2019   NA 136 04/21/2019   K 3.7 04/21/2019   CL 96 (L) 04/21/2019   CO2 28 04/21/2019    Lab Results  Component Value Date   WBC 10.6 (H) 04/21/2019   HGB 14.4 04/21/2019   HCT 41.5 04/21/2019   MCV 93.0 04/21/2019   PLT 256 04/21/2019    Lab Results  Component Value Date   ALT 73 (H) 04/21/2019   AST 80 (H) 04/21/2019   ALKPHOS 189 (H) 04/21/2019   BILITOT 0.9 04/21/2019   Assessment & Plan:   Problem List Items Addressed This Visit    Zenker diverticulum    Zenker repair by [facility] ([facility]) had to be delayed due to hospitalization.       GERD (gastroesophageal reflux disease)    Discussed omeprazole vs pantoprazole - he will let us know when running out of pantoprazole.       Common bile duct stone    S/p ERCP      Relevant Orders    Comprehensive metabolic panel   CBC with Differential/Platelet   Choledocholithiasis with acute cholecystitis - Primary    Recovering well s/p lap chole and finishing oral keflex today for total 2 wk abx course. hospitalization reviewed with patient. He will f/u with GI and gen surgery. Requests our assistance to schedule GI f/u Ardis Hughs). Update labs to ensure tranasminitis resolving.       Relevant Orders   Ambulatory referral to Gastroenterology       No orders of the defined types were placed in this encounter.  Orders Placed This Encounter  Procedures  . Comprehensive metabolic panel  . CBC with Differential/Platelet  . Ambulatory referral to Gastroenterology    Referral Priority:   Routine    Referral Type:   Consultation    Referral Reason:   Specialty Services Required    Number of Visits Requested:   1   Patient Instructions  Labs today Finish antibiotic Good to see you today Keep appointment with Dr [name] Schedule f/u appointment with Dr Ardis Hughs.    Follow up plan: No follow-ups on file.  [name], MD

## 2019-05-01 NOTE — Patient Instructions (Addendum)
Labs today Finish antibiotic Good to see you today Keep appointment with Dr [name] Schedule f/u appointment with Dr Ardis Hughs.

## 2019-05-01 NOTE — Assessment & Plan Note (Addendum)
Zenker repair by [facility] had to be delayed due to hospitalization.

## 2019-05-02 NOTE — Telephone Encounter (Signed)
OK, thanks.  NOt sure what we need to do either.  Let me know if you hear more.  thanks

## 2019-05-09 ENCOUNTER — Inpatient Hospital Stay: Payer: PPO | Admitting: Family Medicine

## 2019-05-10 LAB — FUNGUS CULTURE W SMEAR
MICRO NUMBER:: 939381
SMEAR:: NONE SEEN
SPECIMEN QUALITY:: ADEQUATE

## 2019-05-10 LAB — RESPIRATORY CULTURE OR RESPIRATORY AND SPUTUM CULTURE
MICRO NUMBER:: 939382
RESULT:: NORMAL
SPECIMEN QUALITY:: ADEQUATE

## 2019-05-11 DIAGNOSIS — R82998 Other abnormal findings in urine: Secondary | ICD-10-CM | POA: Diagnosis not present

## 2019-05-14 ENCOUNTER — Telehealth: Payer: Self-pay | Admitting: Family Medicine

## 2019-05-14 DIAGNOSIS — G4733 Obstructive sleep apnea (adult) (pediatric): Secondary | ICD-10-CM | POA: Diagnosis not present

## 2019-05-14 DIAGNOSIS — J449 Chronic obstructive pulmonary disease, unspecified: Secondary | ICD-10-CM | POA: Diagnosis not present

## 2019-05-14 NOTE — Telephone Encounter (Signed)
plz call to f/u on patient regarding [facility] appointment - was he told he specifically needed clearance to f/u with [facility]? If not would have him call Dr Harl Bowie to see if he can reschedule zenker surgery. I did find report of ERCP performed in hospital and could print out for patient to take with him to Dr Harl Bowie (although he should also be able to pull it up).

## 2019-05-16 NOTE — Telephone Encounter (Signed)
Spoke with pt asking about [facility].  States they [[facility] will not see him without a clearance.  Scheduled pt tomorrow at 12:15.    Also, pt says he was told one of labs done by Dr. [name] showed elevated liver level.  Pt requesting Dr. Darnell Level look at it and advise.

## 2019-05-17 ENCOUNTER — Encounter: Payer: Self-pay | Admitting: Family Medicine

## 2019-05-17 ENCOUNTER — Other Ambulatory Visit: Payer: Self-pay

## 2019-05-17 ENCOUNTER — Ambulatory Visit (INDEPENDENT_AMBULATORY_CARE_PROVIDER_SITE_OTHER): Payer: PPO | Admitting: Family Medicine

## 2019-05-17 VITALS — BP 98/58 | HR 65 | Temp 98.2°F | Ht 70.0 in | Wt 202.1 lb

## 2019-05-17 DIAGNOSIS — K225 Diverticulum of esophagus, acquired: Secondary | ICD-10-CM

## 2019-05-17 DIAGNOSIS — K8050 Calculus of bile duct without cholangitis or cholecystitis without obstruction: Secondary | ICD-10-CM

## 2019-05-17 DIAGNOSIS — K5710 Diverticulosis of small intestine without perforation or abscess without bleeding: Secondary | ICD-10-CM

## 2019-05-17 DIAGNOSIS — K8042 Calculus of bile duct with acute cholecystitis without obstruction: Secondary | ICD-10-CM

## 2019-05-17 DIAGNOSIS — R5382 Chronic fatigue, unspecified: Secondary | ICD-10-CM | POA: Diagnosis not present

## 2019-05-17 DIAGNOSIS — I10 Essential (primary) hypertension: Secondary | ICD-10-CM

## 2019-05-17 NOTE — Progress Notes (Signed)
This visit was conducted in person.  BP (!) 98/58 (BP Location: Right Arm, Patient Position: Sitting, Cuff Size: Normal)   Pulse 65   Temp 98.2 F (36.8 C) (Temporal)   Ht 5\' 10"  (1.778 m)   Wt 202 lb 1 oz (91.7 kg)   SpO2 91%   BMI 28.99 kg/m    CC: discuss clearance to see [facility]  Subjective:    Patient ID: Carl Lee, male    DOB: 15-Aug-1955, 63 y.o.   MRN: [id number]  HPI: Carl Lee is a 63 y.o. male presenting on 05/17/2019 for Medical Clearance (Needs clearance to been seen at [facility].  ) and Fatigue (C/o fatigue since having surgeries. )   See prior note for details.  Difficulty getting cricopharyngeal myotomy rescheduled through [facility]. States he's been told by [facility] that he needs clearance to return to see Dr Harl Bowie. Procedure had to be rescheduled due to last month's hospitalization with choledocholithiasis and cholecystitis. Unclear what clearance is required. Latest LFTs back to normal 05/01/2019. He has already received pulm and cards clearance.   Ongoing fatigue since hospitalization. Some diarrhea this morning. Some low BP readings noted as well.      Relevant past medical, surgical, family and social history reviewed and updated as indicated. Interim medical history since our last visit reviewed. Allergies and medications reviewed and updated. Outpatient Medications Prior to Visit  Medication Sig Dispense Refill  . albuterol (PROAIR HFA) 108 (90 Base) MCG/ACT inhaler Inhale 2 puffs into the lungs every 4 (four) hours as needed for wheezing or shortness of breath. 1 g 3  . amLODipine (NORVASC) 5 MG tablet Take 1 tablet (5 mg total) by mouth daily. 30 tablet 1  . aspirin EC 81 MG tablet Take 1 tablet (81 mg total) by mouth daily. 90 tablet 1  . atorvastatin (LIPITOR) 20 MG tablet TAKE 1 TABLET (20 MG TOTAL) BY MOUTH DAILY AT 6 PM. 90 tablet 3  . cholecalciferol (VITAMIN D3) 25 MCG (1000 UT) tablet Take 1,000 Units by mouth daily.    . famotidine (PEPCID) 20  MG tablet Take 1 tablet (20 mg total) by mouth 2 (two) times daily. 60 tablet 1  . fluticasone furoate-vilanterol (BREO ELLIPTA) 100-25 MCG/INH AEPB Inhale 1 puff into the lungs daily.    [name] glucose blood (ONE TOUCH ULTRA TEST) test strip CHECK ONCE DAILY AND AS NEEDED    . ipratropium-albuterol (DUONEB) 0.5-2.5 (3) MG/3ML SOLN Take 3 mLs by nebulization every 6 (six) hours as needed. 360 mL 0  . ketotifen (ZADITOR) 0.025 % ophthalmic solution Place 1 drop into both eyes daily as needed (allergies). 5 mL 1  . metoprolol tartrate (LOPRESSOR) 25 MG tablet TAKE 0.5 TABLETS (12.5 MG TOTAL) BY MOUTH 2 (TWO) TIMES DAILY. 90 tablet 3  . pantoprazole (PROTONIX) 40 MG tablet Take 1 tablet (40 mg total) by mouth daily. 30 tablet 2  . tamsulosin (FLOMAX) 0.4 MG CAPS capsule Take 1 capsule (0.4 mg total) by mouth daily after supper. 30 capsule 1  . vitamin B-12 (CYANOCOBALAMIN) 500 MCG tablet Take 1 tablet (500 mcg total) by mouth every other day. (Patient taking differently: Take 500 mcg by mouth every other day. Took on Monday)    . acetaminophen (TYLENOL) 325 MG tablet Take 2 tablets (650 mg total) by mouth every 6 (six) hours as needed for moderate pain or headache. (Patient not taking: Reported on 05/01/2019) 60 tablet 0  . Glycopyrrolate-Formoterol (BEVESPI AEROSPHERE) 9-4.8  MCG/ACT AERO Inhale 2 puffs into the lungs 2 (two) times daily. (Patient not taking: Reported on 05/01/2019) 5.9 g 3  . Guaifenesin (MUCINEX MAXIMUM STRENGTH) 1200 MG TB12 Take 1 tablet (1,200 mg total) by mouth 2 (two) times daily. 14 tablet 0  . ondansetron (ZOFRAN) 4 MG tablet Take 1 tablet (4 mg total) by mouth every 6 (six) hours as needed for nausea. (Patient not taking: Reported on 05/01/2019) 20 tablet 0   No facility-administered medications prior to visit.      Per HPI unless specifically indicated in ROS section below Review of Systems Objective:    BP (!) 98/58 (BP Location: Right Arm, Patient Position: Sitting, Cuff  Size: Normal)   Pulse 65   Temp 98.2 F (36.8 C) (Temporal)   Ht 5\' 10"  (1.778 m)   Wt 202 lb 1 oz (91.7 kg)   SpO2 91%   BMI 28.99 kg/m   Wt Readings from Last 3 Encounters:  05/17/19 202 lb 1 oz (91.7 kg)  05/01/19 202 lb 3 oz (91.7 kg)  04/21/19 197 lb 12.8 oz (89.7 kg)    Physical Exam Vitals signs and nursing note reviewed.  Constitutional:      General: He is not in acute distress.    Appearance: Normal appearance. He is not ill-appearing.  Neurological:     Mental Status: He is alert.  Psychiatric:        Mood and Affect: Mood normal.        Behavior: Behavior normal.       Results for orders placed or performed in visit on 05/01/19  Comprehensive metabolic panel  Result Value Ref Range   Sodium 139 135 - 145 mEq/L   Potassium 4.6 3.5 - 5.1 mEq/L   Chloride 102 96 - 112 mEq/L   CO2 30 19 - 32 mEq/L   Glucose, Bld 108 (H) 70 - 99 mg/dL   BUN 16 6 - 23 mg/dL   Creatinine, Ser 0.69 0.40 - 1.50 mg/dL   Total Bilirubin 1.1 0.2 - 1.2 mg/dL   Alkaline Phosphatase 113 39 - 117 U/L   AST 14 0 - 37 U/L   ALT 10 0 - 53 U/L   Total Protein 6.5 6.0 - 8.3 g/dL   Albumin 4.1 3.5 - 5.2 g/dL   Calcium 9.5 8.4 - 10.5 mg/dL   GFR 115.04 >60.00 mL/min  CBC with Differential/Platelet  Result Value Ref Range   WBC 10.3 4.0 - 10.5 K/uL   RBC 5.01 4.22 - 5.81 Mil/uL   Hemoglobin 15.7 13.0 - 17.0 g/dL   HCT 47.2 39.0 - 52.0 %   MCV 94.1 78.0 - 100.0 fl   MCHC 33.4 30.0 - 36.0 g/dL   RDW 13.9 11.5 - 15.5 %   Platelets 439.0 (H) 150.0 - 400.0 K/uL   Neutrophils Relative % 61.9 43.0 - 77.0 %   Lymphocytes Relative 25.1 12.0 - 46.0 %   Monocytes Relative 9.0 3.0 - 12.0 %   Eosinophils Relative 2.8 0.0 - 5.0 %   Basophils Relative 1.2 0.0 - 3.0 %   Neutro Abs 6.4 1.4 - 7.7 K/uL   Lymphs Abs 2.6 0.7 - 4.0 K/uL   Monocytes Absolute 0.9 0.1 - 1.0 K/uL   Eosinophils Absolute 0.3 0.0 - 0.7 K/uL   Basophils Absolute 0.1 0.0 - 0.1 K/uL   Assessment & Plan:   Problem List Items  Addressed This Visit    Zenker diverticulum - Primary    I called [facility] and spoke with secretary - she will send note to Dr Nelly Laurence team for clarity on what exactly is needed for patient to be able to schedule f/u appt/procedure. Appreciate [facility] care.       Hypertension    Some fluctuations in blood pressure readings - he will start monitoring more closely and update [place] with readings - will determine need for med change at that time.      Duodenal diverticulum    Noted on recent ERCP.       Common bile duct stone    S/p ERCP      Chronic fatigue    With low blood pressures likely related to BP med. He will start monitoring BP more closely at home and call me in 1 wk with update on readings, will consider change in antihypertensives based on home readings. Anticipate recent looser stools leading to some dehydration and hypotension from this. Reviewed importance of good hydration       Choledocholithiasis with acute cholecystitis    Seems fully recovered from this.           No orders of the defined types were placed in this encounter.  No orders of the defined types were placed in this encounter.   Patient Instructions  Monitor blood pressures more closely over the next week and call me with readings.  Continue current medicines. I have touched base with [facility] - a message has been sent to Dr Harl Bowie. Let me know if you haven't heard from them by end of the week.   We will request latest labs from Dr [name]. Last labs 05/01/2019 liver function was back to normal.  We printed out ERCP report to take to Dr Harl Bowie.    Follow up plan: Return if symptoms worsen or fail to improve.  [name], MD

## 2019-05-17 NOTE — Assessment & Plan Note (Signed)
I called [facility] and spoke with secretary - she will send note to Dr Nelly Laurence team for clarity on what exactly is needed for patient to be able to schedule f/u appt/procedure. Appreciate [facility] care.

## 2019-05-17 NOTE — Assessment & Plan Note (Signed)
Seems fully recovered from this.

## 2019-05-17 NOTE — Assessment & Plan Note (Addendum)
With low blood pressures likely related to BP med. He will start monitoring BP more closely at home and call me in 1 wk with update on readings, will consider change in antihypertensives based on home readings. Anticipate recent looser stools leading to some dehydration and hypotension from this. Reviewed importance of good hydration

## 2019-05-17 NOTE — Patient Instructions (Addendum)
Monitor blood pressures more closely over the next week and call me with readings.  Continue current medicines. I have touched base with [facility] - a message has been sent to Dr Harl Bowie. Let me know if you haven't heard from them by end of the week.   We will request latest labs from Dr [name]. Last labs 05/01/2019 liver function was back to normal.  We printed out ERCP report to take to Dr Harl Bowie.

## 2019-05-17 NOTE — Assessment & Plan Note (Signed)
S/p ERCP

## 2019-05-17 NOTE — Assessment & Plan Note (Signed)
Some fluctuations in blood pressure readings - he will start monitoring more closely and update [place] with readings - will determine need for med change at that time.

## 2019-05-17 NOTE — Assessment & Plan Note (Signed)
Noted on recent ERCP.

## 2019-05-21 ENCOUNTER — Telehealth: Payer: Self-pay | Admitting: *Deleted

## 2019-05-21 NOTE — Telephone Encounter (Signed)
-----   Message from Sueanne Margarita, MD sent at 05/21/2019  4:41 PM EST ----- Good AHI and compliance.  Continue current PAP settings.

## 2019-05-21 NOTE — Telephone Encounter (Signed)
Informed patient of compliance results and verbalized understanding was indicated. Patient is aware and agreeable to AHI being within range at 2.5. Patient is aware and agreeable to being in compliance with machine usage Patient is aware and agreeable to no change in current pressures

## 2019-05-22 ENCOUNTER — Telehealth: Payer: Self-pay | Admitting: *Deleted

## 2019-05-22 ENCOUNTER — Telehealth: Payer: Self-pay | Admitting: Family Medicine

## 2019-05-22 DIAGNOSIS — G4733 Obstructive sleep apnea (adult) (pediatric): Secondary | ICD-10-CM

## 2019-05-22 NOTE — Telephone Encounter (Signed)
Order placed to [facility] via community message to Decrease BiPAP to 11/7cm H2O and get a download in 2 weeks

## 2019-05-22 NOTE — Telephone Encounter (Signed)
-----   Message from Antonieta Iba, RN sent at 05/22/2019 12:10 PM EST -----  ----- Message ----- From: Sueanne Margarita, MD Sent: 05/21/2019   6:47 PM EST To: Antonieta Iba, RN  Decrease BiPAP to 11/7cm H2O  and get a download in 2 weeks  Traci Turner ----- Message ----- From: Antonieta Iba, RN Sent: 05/21/2019   4:58 PM EST To: Sueanne Margarita, MD  The patient has been notified of the result and verbalized understanding.   Patient states that his BiPAP is blowing to hard and is wondering if it can be decreased again.

## 2019-05-22 NOTE — Telephone Encounter (Signed)
Patient called.  Patient had an appointment with Dr.G last week.  Dr.G called Dr Nelly Laurence office to find out about patient rescheduling a procedure.  Dr.G told patient to call him back , if he didn't hear back from Dr.Branch's office by the end of last week.  Patient hasn't heard from Dr.Branch's office.

## 2019-05-23 NOTE — Telephone Encounter (Signed)
Pt notified as instructed by phone.  Verbalizes understanding.  

## 2019-05-23 NOTE — Telephone Encounter (Signed)
plz notify patient - I can see in chart that Dr Nelly Laurence RN reviewed his chart and recommended some things prior to scheduling (covid testing, possible preop anesthesia evaluation) and routed the phone note to the scheduling department. So looks like he doesn't need any clearance from our end - just waiting on [facility] scheduling to call him for appointments.

## 2019-05-30 LAB — AFB CULTURE WITH SMEAR (NOT AT ARMC)
Acid Fast Culture: NEGATIVE
Acid Fast Smear: NEGATIVE

## 2019-06-05 DIAGNOSIS — E785 Hyperlipidemia, unspecified: Secondary | ICD-10-CM | POA: Insufficient documentation

## 2019-06-05 DIAGNOSIS — C679 Malignant neoplasm of bladder, unspecified: Secondary | ICD-10-CM | POA: Insufficient documentation

## 2019-06-11 DIAGNOSIS — N401 Enlarged prostate with lower urinary tract symptoms: Secondary | ICD-10-CM | POA: Diagnosis not present

## 2019-06-11 DIAGNOSIS — Z8551 Personal history of malignant neoplasm of bladder: Secondary | ICD-10-CM | POA: Diagnosis not present

## 2019-06-11 DIAGNOSIS — R3915 Urgency of urination: Secondary | ICD-10-CM | POA: Diagnosis not present

## 2019-06-11 DIAGNOSIS — R350 Frequency of micturition: Secondary | ICD-10-CM | POA: Diagnosis not present

## 2019-06-12 ENCOUNTER — Ambulatory Visit: Payer: PPO | Admitting: Gastroenterology

## 2019-06-12 HISTORY — PX: ZENKER'S DIVERTICULECTOMY ENDOSCOPIC: SHX6191

## 2019-06-12 HISTORY — PX: OTHER SURGICAL HISTORY: SHX169

## 2019-06-18 DIAGNOSIS — Z01818 Encounter for other preprocedural examination: Secondary | ICD-10-CM | POA: Diagnosis not present

## 2019-06-18 DIAGNOSIS — Z20828 Contact with and (suspected) exposure to other viral communicable diseases: Secondary | ICD-10-CM | POA: Diagnosis not present

## 2019-06-18 DIAGNOSIS — Z01812 Encounter for preprocedural laboratory examination: Secondary | ICD-10-CM | POA: Diagnosis not present

## 2019-06-21 DIAGNOSIS — G4730 Sleep apnea, unspecified: Secondary | ICD-10-CM | POA: Diagnosis not present

## 2019-06-21 DIAGNOSIS — I714 Abdominal aortic aneurysm, without rupture: Secondary | ICD-10-CM | POA: Diagnosis not present

## 2019-06-21 DIAGNOSIS — Z7982 Long term (current) use of aspirin: Secondary | ICD-10-CM | POA: Diagnosis not present

## 2019-06-21 DIAGNOSIS — R1310 Dysphagia, unspecified: Secondary | ICD-10-CM | POA: Diagnosis not present

## 2019-06-21 DIAGNOSIS — Z79899 Other long term (current) drug therapy: Secondary | ICD-10-CM | POA: Diagnosis not present

## 2019-06-21 DIAGNOSIS — E785 Hyperlipidemia, unspecified: Secondary | ICD-10-CM | POA: Diagnosis not present

## 2019-06-21 DIAGNOSIS — Z87891 Personal history of nicotine dependence: Secondary | ICD-10-CM | POA: Diagnosis not present

## 2019-06-21 DIAGNOSIS — I10 Essential (primary) hypertension: Secondary | ICD-10-CM | POA: Diagnosis not present

## 2019-06-21 DIAGNOSIS — K225 Diverticulum of esophagus, acquired: Secondary | ICD-10-CM | POA: Diagnosis not present

## 2019-06-21 DIAGNOSIS — Z8551 Personal history of malignant neoplasm of bladder: Secondary | ICD-10-CM | POA: Diagnosis not present

## 2019-06-21 DIAGNOSIS — I2510 Atherosclerotic heart disease of native coronary artery without angina pectoris: Secondary | ICD-10-CM | POA: Diagnosis not present

## 2019-06-21 DIAGNOSIS — Z951 Presence of aortocoronary bypass graft: Secondary | ICD-10-CM | POA: Diagnosis not present

## 2019-06-21 DIAGNOSIS — K222 Esophageal obstruction: Secondary | ICD-10-CM | POA: Diagnosis not present

## 2019-06-21 DIAGNOSIS — E119 Type 2 diabetes mellitus without complications: Secondary | ICD-10-CM | POA: Diagnosis not present

## 2019-06-21 DIAGNOSIS — Z9911 Dependence on respirator [ventilator] status: Secondary | ICD-10-CM | POA: Diagnosis not present

## 2019-06-21 DIAGNOSIS — G8929 Other chronic pain: Secondary | ICD-10-CM | POA: Diagnosis not present

## 2019-06-21 DIAGNOSIS — K219 Gastro-esophageal reflux disease without esophagitis: Secondary | ICD-10-CM | POA: Diagnosis not present

## 2019-06-21 DIAGNOSIS — J449 Chronic obstructive pulmonary disease, unspecified: Secondary | ICD-10-CM | POA: Diagnosis not present

## 2019-06-21 DIAGNOSIS — K3189 Other diseases of stomach and duodenum: Secondary | ICD-10-CM | POA: Diagnosis not present

## 2019-06-21 DIAGNOSIS — K449 Diaphragmatic hernia without obstruction or gangrene: Secondary | ICD-10-CM | POA: Diagnosis not present

## 2019-06-25 ENCOUNTER — Encounter: Payer: Self-pay | Admitting: Family Medicine

## 2019-06-26 ENCOUNTER — Encounter: Payer: Self-pay | Admitting: Gastroenterology

## 2019-06-30 ENCOUNTER — Other Ambulatory Visit: Payer: Self-pay | Admitting: Family Medicine

## 2019-07-02 ENCOUNTER — Ambulatory Visit: Payer: PPO | Admitting: Cardiovascular Disease

## 2019-07-11 ENCOUNTER — Encounter: Payer: Self-pay | Admitting: Family Medicine

## 2019-07-11 ENCOUNTER — Telehealth: Payer: Self-pay

## 2019-07-11 DIAGNOSIS — K225 Diverticulum of esophagus, acquired: Secondary | ICD-10-CM | POA: Diagnosis not present

## 2019-07-11 NOTE — Telephone Encounter (Signed)
LVM w COVID screen, front door and back lab info 12.30.2020 TLJ

## 2019-07-17 ENCOUNTER — Other Ambulatory Visit (INDEPENDENT_AMBULATORY_CARE_PROVIDER_SITE_OTHER): Payer: PPO

## 2019-07-17 ENCOUNTER — Other Ambulatory Visit: Payer: Self-pay | Admitting: Family Medicine

## 2019-07-17 ENCOUNTER — Telehealth: Payer: Self-pay | Admitting: *Deleted

## 2019-07-17 ENCOUNTER — Other Ambulatory Visit: Payer: Self-pay

## 2019-07-17 DIAGNOSIS — I10 Essential (primary) hypertension: Secondary | ICD-10-CM | POA: Diagnosis not present

## 2019-07-17 DIAGNOSIS — E1169 Type 2 diabetes mellitus with other specified complication: Secondary | ICD-10-CM

## 2019-07-17 DIAGNOSIS — E538 Deficiency of other specified B group vitamins: Secondary | ICD-10-CM

## 2019-07-17 DIAGNOSIS — Z125 Encounter for screening for malignant neoplasm of prostate: Secondary | ICD-10-CM

## 2019-07-17 DIAGNOSIS — K760 Fatty (change of) liver, not elsewhere classified: Secondary | ICD-10-CM

## 2019-07-17 DIAGNOSIS — R7303 Prediabetes: Secondary | ICD-10-CM

## 2019-07-17 DIAGNOSIS — E785 Hyperlipidemia, unspecified: Secondary | ICD-10-CM | POA: Diagnosis not present

## 2019-07-17 LAB — CBC WITH DIFFERENTIAL/PLATELET
Basophils Absolute: 0.1 10*3/uL (ref 0.0–0.1)
Basophils Relative: 0.9 % (ref 0.0–3.0)
Eosinophils Absolute: 0.5 10*3/uL (ref 0.0–0.7)
Eosinophils Relative: 5.6 % — ABNORMAL HIGH (ref 0.0–5.0)
HCT: 49.3 % (ref 39.0–52.0)
Hemoglobin: 16.7 g/dL (ref 13.0–17.0)
Lymphocytes Relative: 30.9 % (ref 12.0–46.0)
Lymphs Abs: 2.9 10*3/uL (ref 0.7–4.0)
MCHC: 33.8 g/dL (ref 30.0–36.0)
MCV: 93 fl (ref 78.0–100.0)
Monocytes Absolute: 0.8 10*3/uL (ref 0.1–1.0)
Monocytes Relative: 8.5 % (ref 3.0–12.0)
Neutro Abs: 5 10*3/uL (ref 1.4–7.7)
Neutrophils Relative %: 54.1 % (ref 43.0–77.0)
Platelets: 237 10*3/uL (ref 150.0–400.0)
RBC: 5.31 Mil/uL (ref 4.22–5.81)
RDW: 14.8 % (ref 11.5–15.5)
WBC: 9.3 10*3/uL (ref 4.0–10.5)

## 2019-07-17 LAB — PSA: PSA: 0.93 ng/mL (ref 0.10–4.00)

## 2019-07-17 LAB — LIPID PANEL
Cholesterol: 97 mg/dL (ref 0–200)
HDL: 37.5 mg/dL — ABNORMAL LOW (ref >39.00)
LDL Cholesterol: 36 mg/dL (ref 0–99)
NonHDL: 59
Total CHOL/HDL Ratio: 3
Triglycerides: 117 mg/dL (ref 0.0–149.0)
VLDL: 23.4 mg/dL (ref 0.0–40.0)

## 2019-07-17 LAB — HEMOGLOBIN A1C: Hgb A1c MFr Bld: 6.3 % (ref 4.6–6.5)

## 2019-07-17 LAB — MICROALBUMIN / CREATININE URINE RATIO
Creatinine,U: 84.5 mg/dL
Microalb Creat Ratio: 1.8 mg/g (ref 0.0–30.0)
Microalb, Ur: 1.5 mg/dL (ref 0.0–1.9)

## 2019-07-17 LAB — COMPREHENSIVE METABOLIC PANEL
ALT: 8 U/L (ref 0–53)
AST: 13 U/L (ref 0–37)
Albumin: 3.9 g/dL (ref 3.5–5.2)
Alkaline Phosphatase: 92 U/L (ref 39–117)
BUN: 13 mg/dL (ref 6–23)
CO2: 28 mEq/L (ref 19–32)
Calcium: 9.4 mg/dL (ref 8.4–10.5)
Chloride: 102 mEq/L (ref 96–112)
Creatinine, Ser: 0.74 mg/dL (ref 0.40–1.50)
GFR: 106.05 mL/min (ref >60.00)
Glucose, Bld: 113 mg/dL — ABNORMAL HIGH (ref 70–99)
Potassium: 4.4 mEq/L (ref 3.5–5.1)
Sodium: 139 mEq/L (ref 135–145)
Total Bilirubin: 1.1 mg/dL (ref 0.2–1.2)
Total Protein: 6.2 g/dL (ref 6.0–8.3)

## 2019-07-17 LAB — TSH: TSH: 2.37 u[IU]/mL (ref 0.35–4.50)

## 2019-07-17 LAB — VITAMIN B12: Vitamin B-12: 285 pg/mL (ref 211–911)

## 2019-07-17 NOTE — Telephone Encounter (Signed)
-----   Message from Sueanne Margarita, MD sent at 07/17/2019  4:26 PM EST ----- Good AHI and compliance.  Continue current PAP settings.

## 2019-07-17 NOTE — Telephone Encounter (Signed)
Informed patient of compliance results and verbalized understanding was indicated. Patient is aware and agreeable to AHI being within range at 1.1. Patient is aware and agreeable to being in compliance with machine usage Patient is aware and agreeable to no change in current pressures.

## 2019-07-18 ENCOUNTER — Telehealth: Payer: Self-pay | Admitting: *Deleted

## 2019-07-18 DIAGNOSIS — Z87891 Personal history of nicotine dependence: Secondary | ICD-10-CM

## 2019-07-18 NOTE — Telephone Encounter (Signed)
Patient has been notified that annual lung cancer screening low dose CT scan is due currently or will be in near future. Confirmed that patient is within the age range of 55-77, and asymptomatic, (no signs or symptoms of lung cancer). Patient denies illness that would prevent curative treatment for lung cancer if found. Verified smoking history, (former, quit 02/05/14, 40 pack year). The shared decision making visit was done 07/18/18. Patient is agreeable for CT scan being scheduled.

## 2019-07-19 ENCOUNTER — Telehealth: Payer: PPO

## 2019-07-20 ENCOUNTER — Other Ambulatory Visit: Payer: Self-pay

## 2019-07-20 ENCOUNTER — Ambulatory Visit
Admission: RE | Admit: 2019-07-20 | Discharge: 2019-07-20 | Disposition: A | Discharge status: Home / Self Care | Payer: PPO | Source: Ambulatory Visit | Attending: Oncology | Admitting: Oncology

## 2019-07-20 DIAGNOSIS — Z87891 Personal history of nicotine dependence: Secondary | ICD-10-CM | POA: Diagnosis not present

## 2019-07-23 ENCOUNTER — Other Ambulatory Visit: Payer: Self-pay

## 2019-07-23 ENCOUNTER — Encounter: Payer: Self-pay | Admitting: Family Medicine

## 2019-07-23 ENCOUNTER — Ambulatory Visit (INDEPENDENT_AMBULATORY_CARE_PROVIDER_SITE_OTHER): Payer: PPO | Admitting: Family Medicine

## 2019-07-23 VITALS — BP 114/66 | HR 74 | Temp 97.9°F | Ht 68.5 in | Wt 199.4 lb

## 2019-07-23 DIAGNOSIS — I10 Essential (primary) hypertension: Secondary | ICD-10-CM

## 2019-07-23 DIAGNOSIS — I2510 Atherosclerotic heart disease of native coronary artery without angina pectoris: Secondary | ICD-10-CM

## 2019-07-23 DIAGNOSIS — M1612 Unilateral primary osteoarthritis, left hip: Secondary | ICD-10-CM

## 2019-07-23 DIAGNOSIS — E118 Type 2 diabetes mellitus with unspecified complications: Secondary | ICD-10-CM

## 2019-07-23 DIAGNOSIS — Z0000 Encounter for general adult medical examination without abnormal findings: Secondary | ICD-10-CM | POA: Diagnosis not present

## 2019-07-23 DIAGNOSIS — K225 Diverticulum of esophagus, acquired: Secondary | ICD-10-CM

## 2019-07-23 DIAGNOSIS — Z951 Presence of aortocoronary bypass graft: Secondary | ICD-10-CM

## 2019-07-23 DIAGNOSIS — H9190 Unspecified hearing loss, unspecified ear: Secondary | ICD-10-CM

## 2019-07-23 DIAGNOSIS — Z9889 Other specified postprocedural states: Secondary | ICD-10-CM

## 2019-07-23 DIAGNOSIS — E538 Deficiency of other specified B group vitamins: Secondary | ICD-10-CM

## 2019-07-23 DIAGNOSIS — J449 Chronic obstructive pulmonary disease, unspecified: Secondary | ICD-10-CM

## 2019-07-23 DIAGNOSIS — Z7189 Other specified counseling: Secondary | ICD-10-CM

## 2019-07-23 DIAGNOSIS — E785 Hyperlipidemia, unspecified: Secondary | ICD-10-CM

## 2019-07-23 DIAGNOSIS — K219 Gastro-esophageal reflux disease without esophagitis: Secondary | ICD-10-CM

## 2019-07-23 DIAGNOSIS — K760 Fatty (change of) liver, not elsewhere classified: Secondary | ICD-10-CM

## 2019-07-23 DIAGNOSIS — Z8551 Personal history of malignant neoplasm of bladder: Secondary | ICD-10-CM

## 2019-07-23 DIAGNOSIS — Z96642 Presence of left artificial hip joint: Secondary | ICD-10-CM

## 2019-07-23 DIAGNOSIS — E1169 Type 2 diabetes mellitus with other specified complication: Secondary | ICD-10-CM

## 2019-07-23 MED ORDER — PANTOPRAZOLE SODIUM 40 MG PO TBEC: 40.0000 mg | DELAYED_RELEASE_TABLET | Freq: Every day | ORAL | 3 refills | Status: DC

## 2019-07-23 MED ORDER — VITAMIN B-12 500 MCG PO TABS: 500.0000 ug | ORAL_TABLET | Freq: Every day | ORAL | Status: AC

## 2019-07-23 NOTE — Assessment & Plan Note (Signed)
Preventative protocols reviewed and updated unless pt declined. Discussed healthy diet and lifestyle.  

## 2019-07-23 NOTE — Assessment & Plan Note (Addendum)
Upcoming appt next week to establish care with new provider. Managed with breo ellipta daily and albuterol PRN.

## 2019-07-23 NOTE — Assessment & Plan Note (Addendum)
Chronic, diet controlled actually in prediabetes range. Continue to monitor.

## 2019-07-23 NOTE — Assessment & Plan Note (Signed)
Chronic, stable continue current regimen.  

## 2019-07-23 NOTE — Patient Instructions (Addendum)
We will refer you to audiologist.  If interested, check with pharmacy about new 2 shot shingles series (shingrix).  Bring [place] a copy of your living will.  Good to see you today Return as needed or in 6 months for follow up visit.   Health Maintenance After Age 66 After age 73, you are at a higher risk for certain long-term diseases and infections as well as injuries from falls. Falls are a major cause of broken bones and head injuries in people who are older than age 80. Getting regular preventive care can help to keep you healthy and well. Preventive care includes getting regular testing and making lifestyle changes as recommended by your health care provider. Talk with your health care provider about:  Which screenings and tests you should have. A screening is a test that checks for a disease when you have no symptoms.  A diet and exercise plan that is right for you. What should I know about screenings and tests to prevent falls? Screening and testing are the best ways to find a health problem early. Early diagnosis and treatment give you the best chance of managing medical conditions that are common after age 76. Certain conditions and lifestyle choices may make you more likely to have a fall. Your health care provider may recommend:  Regular vision checks. Poor vision and conditions such as cataracts can make you more likely to have a fall. If you wear glasses, make sure to get your prescription updated if your vision changes.  Medicine review. Work with your health care provider to regularly review all of the medicines you are taking, including over-the-counter medicines. Ask your health care provider about any side effects that may make you more likely to have a fall. Tell your health care provider if any medicines that you take make you feel dizzy or sleepy.  Osteoporosis screening. Osteoporosis is a condition that causes the bones to get weaker. This can make the bones weak and cause them  to break more easily.  Blood pressure screening. Blood pressure changes and medicines to control blood pressure can make you feel dizzy.  Strength and balance checks. Your health care provider may recommend certain tests to check your strength and balance while standing, walking, or changing positions.  Foot health exam. Foot pain and numbness, as well as not wearing proper footwear, can make you more likely to have a fall.  Depression screening. You may be more likely to have a fall if you have a fear of falling, feel emotionally low, or feel unable to do activities that you used to do.  Alcohol use screening. Using too much alcohol can affect your balance and may make you more likely to have a fall. What actions can I take to lower my risk of falls? General instructions  Talk with your health care provider about your risks for falling. Tell your health care provider if: ? You fall. Be sure to tell your health care provider about all falls, even ones that seem minor. ? You feel dizzy, sleepy, or off-balance.  Take over-the-counter and prescription medicines only as told by your health care provider. These include any supplements.  Eat a healthy diet and maintain a healthy weight. A healthy diet includes low-fat dairy products, low-fat (lean) meats, and fiber from whole grains, beans, and lots of fruits and vegetables. Home safety  Remove any tripping hazards, such as rugs, cords, and clutter.  Install safety equipment such as grab bars in bathrooms and safety  rails on stairs.  Keep rooms and walkways well-lit. Activity   Follow a regular exercise program to stay fit. This will help you maintain your balance. Ask your health care provider what types of exercise are appropriate for you.  If you need a cane or walker, use it as recommended by your health care provider.  Wear supportive shoes that have nonskid soles. Lifestyle  Do not drink alcohol if your health care provider  tells you not to drink.  If you drink alcohol, limit how much you have: ? 0-1 drink a day for women. ? 0-2 drinks a day for men.  Be aware of how much alcohol is in your drink. In the [place] one drink equals one typical bottle of beer (12 oz), one-half glass of wine (5 oz), or one shot of hard liquor (1 oz).  Do not use any products that contain nicotine or tobacco, such as cigarettes and e-cigarettes. If you need help quitting, ask your health care provider. Summary  Having a healthy lifestyle and getting preventive care can help to protect your health and wellness after age 35.  Screening and testing are the best way to find a health problem early and help you avoid having a fall. Early diagnosis and treatment give you the best chance for managing medical conditions that are more common for people who are older than age 49.  Falls are a major cause of broken bones and head injuries in people who are older than age 59. Take precautions to prevent a fall at home.  Work with your health care provider to learn what changes you can make to improve your health and wellness and to prevent falls. This information is not intended to replace advice given to you by your health care provider. Make sure you discuss any questions you have with your health care provider. Document Revised: 10/19/2018 Document Reviewed: 05/11/2017 Elsevier Patient Education  2020 [employer].

## 2019-07-23 NOTE — Assessment & Plan Note (Signed)
S/p endoscopic diverticulotomy by [facility], doing well from this standpoint.

## 2019-07-23 NOTE — Assessment & Plan Note (Signed)
Continue PPI and pepcid.

## 2019-07-23 NOTE — Assessment & Plan Note (Signed)
Advanced directive -has living will at home. HCPOA is wife. Asked to bring [place] copy

## 2019-07-23 NOTE — Assessment & Plan Note (Signed)
Refer to audiology for further evaluation.

## 2019-07-23 NOTE — Assessment & Plan Note (Signed)
Chronic L hip pain after replacement. Has seen Dr Ernestina Patches. Has been told has bursitis, unable to do bursitis exercises due to hip replacement. Offered sports medicine evaluation.

## 2019-07-23 NOTE — Assessment & Plan Note (Signed)
LFTs normalized after recent cholecystitis s/p removal

## 2019-07-23 NOTE — Assessment & Plan Note (Signed)
Sees [facility] - upcoming appt

## 2019-07-23 NOTE — Assessment & Plan Note (Signed)
Continue uro f/u.  

## 2019-07-23 NOTE — Assessment & Plan Note (Signed)

## 2019-07-23 NOTE — Assessment & Plan Note (Signed)
Chronic, stable. Continue lipitor.  The ASCVD Risk score (Goff DC Jr., et al., 2013) failed to calculate for the following reasons:   The valid total cholesterol range is 130 to 320 mg/dL  

## 2019-07-23 NOTE — Progress Notes (Signed)
This visit was conducted in person.  BP 114/66 (BP Location: Left Arm, Patient Position: Sitting, Cuff Size: Normal)   Pulse 74   Temp 97.9 F (36.6 C) (Temporal)   Ht 5' 8.5" (1.74 m)   Wt 199 lb 7 oz (90.5 kg)   SpO2 95%   BMI 29.88 kg/m    CC: CPE/AMW Subjective:    Patient ID: Carl Lee, male    DOB: 12-Oct-1955, 64 y.o.   MRN: [id number]  HPI: STEEL VAID is a 64 y.o. male presenting on 07/23/2019 for Medicare Wellness   Did not see health advisor this year.    Hearing Screening   125Hz  250Hz  500Hz  1000Hz  2000Hz  3000Hz  4000Hz  6000Hz  8000Hz   Right ear:   25 25 25  25     Left ear:   20 40 40  0    Vision Screening Comments: Last eye exam, 12/2018.     Office Visit from 07/23/2019 in [place] at [facility]  PHQ-2 Total Score  0      Fall Risk  07/23/2019 07/13/2018 11/10/2017 06/29/2017 08/27/2015  Falls in the past year? 0 1 No Yes Yes  Comment - fell twice due to loss of balance - lost balance while walking at night on vacation -  Number falls in past yr: - 1 - 1 2 or more  Injury with Fall? - 0 - No No  Risk for fall due to : - - - - History of fall(s)  Follow up - - - - Falls evaluation completed;Education provided  Notes worsening hearing trouble - has to raise TV volume. Has not seen audiologist - interested.   Had endoscopic myotomy for zenker's by Dr Harl Bowie at [facility]. Doing well from this standpoint.   Ongoing L chest discomfort  Chronic L hip pain after hip replacement, mild benefit with voltaren gel. Bursal shot provided only temporary relief (3-4 wks) with recurrence.   Preventative: COLONOSCOPY WITH PROPOFOL 08/10/2017 - TA, rpt 5 yrs Ardis Hughs, Melene Plan, MD)  Prostate cancer screening - no fmhx.checked at urology (Dr Lovena Neighbours) - sees for h/o bladder cancer  Lung cancer screening: 30+ PY hx. Started yearly screening 07/2018 Flu shot yearly  Pneumovax 06/2014, prevnar 04/2015  Tdap 12/2012  zostavax -12/2015  shingrix - discussed - will postpone    Advanced directive -has living will at home. HCPOA is wife. Asked to bring [place] copy Seat belt use discussed  Sunscreen use discussed, no changing moles. Sees derm ([facility]).  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 Interim medical history since our last visit reviewed. Allergies and medications reviewed and updated. Outpatient Medications Prior to Visit  Medication Sig Dispense Refill  . acetaminophen (TYLENOL) 325 MG tablet Take 2 tablets (650 mg total) by mouth every 6 (six) hours as needed for moderate pain or headache. 60 tablet 0  . albuterol (PROAIR HFA) 108 (90 Base) MCG/ACT inhaler Inhale 2 puffs into the lungs every 4 (four) hours as needed for wheezing or shortness of  breath. 1 g 3  . amLODipine (NORVASC) 5 MG tablet Take 1 tablet (5 mg total) by mouth daily. 30 tablet 1  . aspirin EC 81 MG tablet Take 1 tablet (81 mg total) by mouth daily. 90 tablet 1  . atorvastatin (LIPITOR) 20 MG tablet TAKE 1 TABLET (20 MG TOTAL) BY MOUTH DAILY AT 6 PM. 90 tablet 3  . cholecalciferol (VITAMIN D3) 25 MCG (1000 UT) tablet Take 1,000 Units by mouth daily.    . famotidine (PEPCID) 20 MG tablet Take 1 tablet (20 mg total) by mouth 2 (two) times daily. 60 tablet 1  . fluticasone furoate-vilanterol (BREO ELLIPTA) 100-25 MCG/INH AEPB Inhale 1 puff into the lungs daily.    [name] glucose blood (ONE TOUCH ULTRA TEST) test strip CHECK ONCE DAILY AND AS NEEDED    . ipratropium-albuterol (DUONEB) 0.5-2.5  (3) MG/3ML SOLN Take 3 mLs by nebulization every 6 (six) hours as needed. 360 mL 0  . ketotifen (ZADITOR) 0.025 % ophthalmic solution Place 1 drop into both eyes daily as needed (allergies). 5 mL 1  . metoprolol tartrate (LOPRESSOR) 25 MG tablet TAKE 0.5 TABLETS (12.5 MG TOTAL) BY MOUTH 2 (TWO) TIMES DAILY. 90 tablet 3  . tamsulosin (FLOMAX) 0.4 MG CAPS capsule Take 1 capsule (0.4 mg total) by mouth daily after supper. 30 capsule 1  . pantoprazole (PROTONIX) 40 MG tablet Take 1 tablet (40 mg total) by mouth daily. 30 tablet 2  . vitamin B-12 (CYANOCOBALAMIN) 500 MCG tablet Take 1 tablet (500 mcg total) by mouth every other day. (Patient taking differently: Take 500 mcg by mouth every other day. Took on Monday)     No facility-administered medications prior to visit.     Per HPI unless specifically indicated in ROS section below Review of Systems  Constitutional: Negative for activity change, appetite change, chills, fatigue, fever and unexpected weight change.  HENT: Negative for hearing loss.   Eyes: Negative for visual disturbance.  Respiratory: Positive for cough (mild) and shortness of breath (chronic). Negative for chest tightness and wheezing.   Cardiovascular: Negative for chest pain, palpitations and leg swelling.  Gastrointestinal: Positive for diarrhea (occasional). Negative for abdominal distention, abdominal pain, blood in stool, constipation, nausea and vomiting.  Genitourinary: Negative for difficulty urinating and hematuria.  Musculoskeletal: Negative for arthralgias, myalgias and neck pain.  Skin: Negative for rash.  Neurological: Negative for dizziness, seizures, syncope and headaches.  Hematological: Negative for adenopathy. Bruises/bleeds easily.  Psychiatric/Behavioral: Negative for dysphoric mood. The patient is not nervous/anxious.    Objective:    BP 114/66 (BP Location: Left Arm, Patient Position: Sitting, Cuff Size: Normal)   Pulse 74   Temp 97.9 F (36.6 C)  (Temporal)   Ht 5' 8.5" (1.74 m)   Wt 199 lb 7 oz (90.5 kg)   SpO2 95%   BMI 29.88 kg/m   Wt Readings from Last 3 Encounters:  07/23/19 199 lb 7 oz (90.5 kg)  07/20/19 202 lb (91.6 kg)  05/17/19 202 lb 1 oz (91.7 kg)    Physical Exam Vitals and nursing note reviewed.  Constitutional:      General: He is not in acute distress.    Appearance: Normal appearance. He is well-developed. He is not ill-appearing.  HENT:     Head: Normocephalic and atraumatic.     Right Ear: Hearing, tympanic membrane, ear canal and external ear normal.     Left Ear: Hearing, tympanic membrane, ear canal and external ear normal.     Mouth/Throat:  Mouth: Mucous membranes are moist.     Pharynx: Uvula midline. Posterior oropharyngeal erythema (soft palate L>R and uvular redness without swelling) present. No oropharyngeal exudate.  Eyes:     General: No scleral icterus.    Conjunctiva/sclera: Conjunctivae normal.     Pupils: Pupils are equal, round, and reactive to light.  Neck:     Thyroid: No thyromegaly or thyroid tenderness.     Vascular: No carotid bruit.  Cardiovascular:     Rate and Rhythm: Normal rate and regular rhythm.     Pulses: Normal pulses.          Radial pulses are 2+ on the right side and 2+ on the left side.     Heart sounds: Normal heart sounds. No murmur.  Pulmonary:     Effort: Pulmonary effort is normal. No respiratory distress.     Breath sounds: No wheezing, rhonchi or rales.     Comments: Crackles bibasilarly Abdominal:     General: Abdomen is flat. Bowel sounds are normal. There is no distension.     Palpations: Abdomen is soft. There is no mass.     Tenderness: There is no abdominal tenderness. There is no guarding or rebound.     Hernia: No hernia is present.  Musculoskeletal:        General: Normal range of motion.     Cervical back: Normal range of motion and neck supple.     Right lower leg: No edema.     Left lower leg: No edema.  Lymphadenopathy:      Cervical: No cervical adenopathy.  Skin:    General: Skin is warm and dry.     Findings: No rash.  Neurological:     General: No focal deficit present.     Mental Status: He is alert and oriented to person, place, and time.     Comments:  CN grossly intact, station and gait intact Recall 3/3 Calculation 4/5 serial 7s  Psychiatric:        Mood and Affect: Mood normal.        Behavior: Behavior normal.        Thought Content: Thought content normal.        Judgment: Judgment normal.       Results for orders placed or performed in visit on 07/17/19  TSH  Result Value Ref Range   TSH 2.37 0.35 - 4.50 uIU/mL  CBC with Differential  Result Value Ref Range   WBC 9.3 4.0 - 10.5 K/uL   RBC 5.31 4.22 - 5.81 Mil/uL   Hemoglobin 16.7 13.0 - 17.0 g/dL   HCT 49.3 39.0 - 52.0 %   MCV 93.0 78.0 - 100.0 fl   MCHC 33.8 30.0 - 36.0 g/dL   RDW 14.8 11.5 - 15.5 %   Platelets 237.0 150.0 - 400.0 K/uL   Neutrophils Relative % 54.1 43.0 - 77.0 %   Lymphocytes Relative 30.9 12.0 - 46.0 %   Monocytes Relative 8.5 3.0 - 12.0 %   Eosinophils Relative 5.6 (H) 0.0 - 5.0 %   Basophils Relative 0.9 0.0 - 3.0 %   Neutro Abs 5.0 1.4 - 7.7 K/uL   Lymphs Abs 2.9 0.7 - 4.0 K/uL   Monocytes Absolute 0.8 0.1 - 1.0 K/uL   Eosinophils Absolute 0.5 0.0 - 0.7 K/uL   Basophils Absolute 0.1 0.0 - 0.1 K/uL  Microalbumin / creatinine urine ratio  Result Value Ref Range   Microalb, Ur 1.5 0.0 - 1.9 mg/dL  Creatinine,U 84.5 mg/dL   Microalb Creat Ratio 1.8 0.0 - 30.0 mg/g  PSA  Result Value Ref Range   PSA 0.93 0.10 - 4.00 ng/mL  Hemoglobin A1c  Result Value Ref Range   Hgb A1c MFr Bld 6.3 4.6 - 6.5 %  Comprehensive metabolic panel  Result Value Ref Range   Sodium 139 135 - 145 mEq/L   Potassium 4.4 3.5 - 5.1 mEq/L   Chloride 102 96 - 112 mEq/L   CO2 28 19 - 32 mEq/L   Glucose, Bld 113 (H) 70 - 99 mg/dL   BUN 13 6 - 23 mg/dL   Creatinine, Ser 0.74 0.40 - 1.50 mg/dL   Total Bilirubin 1.1 0.2 - 1.2  mg/dL   Alkaline Phosphatase 92 39 - 117 U/L   AST 13 0 - 37 U/L   ALT 8 0 - 53 U/L   Total Protein 6.2 6.0 - 8.3 g/dL   Albumin 3.9 3.5 - 5.2 g/dL   GFR 106.05 >60.00 mL/min   Calcium 9.4 8.4 - 10.5 mg/dL  Lipid panel  Result Value Ref Range   Cholesterol 97 0 - 200 mg/dL   Triglycerides 117.0 0.0 - 149.0 mg/dL   HDL 37.50 (L) >39.00 mg/dL   VLDL 23.4 0.0 - 40.0 mg/dL   LDL Cholesterol 36 0 - 99 mg/dL   Total CHOL/HDL Ratio 3    NonHDL 59.00   Vitamin B12  Result Value Ref Range   Vitamin B-12 285 211 - 911 pg/mL   Assessment & Plan:  This visit occurred during the SARS-CoV-2 public health emergency.  Safety protocols were in place, including screening questions prior to the visit, additional usage of staff PPE, and extensive cleaning of exam room while observing appropriate contact time as indicated for disinfecting solutions.   Problem List Items Addressed This Visit    Zenker diverticulum    S/p endoscopic diverticulotomy by [facility], doing well from this standpoint.       Vitamin B12 deficiency    Levels dropping - will increase b12 replacement to daily.       Status post total replacement of left hip   S/P CABG x 3   Osteoarthritis of left hip    Chronic L hip pain after replacement. Has seen Dr Ernestina Patches. Has been told has bursitis, unable to do bursitis exercises due to hip replacement. Offered sports medicine evaluation.       Medicare annual wellness visit, subsequent - Primary    I have personally reviewed the Medicare Annual Wellness questionnaire and have noted 1. The patient's medical and social history 2. Their use of alcohol, tobacco or illicit drugs 3. Their current medications and supplements 4. The patient's functional ability including ADL's, fall risks, home safety risks and hearing or visual impairment. Cognitive function has been assessed and addressed as indicated.  5. Diet and physical activity 6. Evidence for depression or mood disorders The  patients weight, height, BMI have been recorded in the chart. I have made referrals, counseling and provided education to the patient based on review of the above and I have provided the pt with a written personalized care plan for preventive services. Provider list updated.. See scanned questionairre as needed for further documentation. Reviewed preventative protocols and updated unless pt declined.       Hypertension    Chronic, stable continue current regimen      Hyperlipidemia associated with type 2 diabetes mellitus (HCC)    Chronic, stable. Continue lipitor.  The  ASCVD Risk score Mikey Bussing DC Jr., et al., 2013) failed to calculate for the following reasons:   The valid total cholesterol range is 130 to 320 mg/dL       History of bladder cancer    Continue uro f/u      History of AAA (abdominal aortic aneurysm) repair    Sees [facility] yearly - upcoming appt      Hepatic steatosis    LFTs normalized after recent cholecystitis s/p removal      Hearing loss    Refer to audiology for further evaluation.      Relevant Orders   Ambulatory referral to Audiology   Health maintenance examination    Preventative protocols reviewed and updated unless pt declined. Discussed healthy diet and lifestyle.       GERD (gastroesophageal reflux disease)    Continue PPI and pepcid.       Relevant Medications   pantoprazole (PROTONIX) 40 MG tablet   Coronary artery disease   COPD, moderate ([place])    Upcoming appt next week to establish care with new provider. Managed with breo ellipta daily and albuterol PRN.       Controlled diabetes mellitus type 2 with complications (HCC)    Chronic, diet controlled actually in prediabetes range. Continue to monitor.       Advanced care planning/counseling discussion    Advanced directive -has living will at home. HCPOA is wife. Asked to bring [place] copy          Meds ordered this encounter  Medications  . pantoprazole (PROTONIX) 40 MG tablet      Sig: Take 1 tablet (40 mg total) by mouth daily.    Dispense:  90 tablet    Refill:  3  . vitamin B-12 (CYANOCOBALAMIN) 500 MCG tablet    Sig: Take 1 tablet (500 mcg total) by mouth daily.   Orders Placed This Encounter  Procedures  . Ambulatory referral to Audiology    Referral Priority:   Routine    Referral Type:   Audiology Exam    Referral Reason:   Specialty Services Required    Number of Visits Requested:   1    Follow up plan: Return in about 6 months (around 01/20/2020) for follow up visit.  [name], MD

## 2019-07-23 NOTE — Assessment & Plan Note (Signed)
Levels dropping - will increase b12 replacement to daily.

## 2019-07-24 ENCOUNTER — Institutional Professional Consult (permissible substitution): Payer: PPO | Admitting: Pulmonary Disease

## 2019-07-24 ENCOUNTER — Telehealth: Payer: Self-pay | Admitting: *Deleted

## 2019-07-24 ENCOUNTER — Encounter: Payer: Self-pay | Admitting: *Deleted

## 2019-07-24 MED ORDER — FIRST-MOUTHWASH BLM MT SUSP: 5.0000 mL | Freq: Two times a day (BID) | OROMUCOSAL | 0 refills | Status: DC | PRN

## 2019-07-24 NOTE — Telephone Encounter (Signed)
Patient left a voicemail stating that he was seen yesterday for a physical. Patient stated that he mentioned to you that he had a red throat with spots on it. Patient stated that it is more sore today and wants to know what Dr. Danise Mina would recommend that he do for his throat?

## 2019-07-24 NOTE — Telephone Encounter (Signed)
Recommend fluids, rest, salt water gargles. Suck on cold things like popsicles or warm things like herbal teas (whichever soothes the throat better). May try mouthwash sent to pharmacy

## 2019-07-25 NOTE — Telephone Encounter (Signed)
Spoke with pt relaying Dr. G's message. Pt verbalizes understanding.  

## 2019-08-01 ENCOUNTER — Other Ambulatory Visit: Payer: Self-pay

## 2019-08-01 ENCOUNTER — Ambulatory Visit: Payer: PPO | Admitting: Pulmonary Disease

## 2019-08-01 ENCOUNTER — Encounter: Payer: Self-pay | Admitting: Pulmonary Disease

## 2019-08-01 VITALS — BP 122/70 | HR 75 | Temp 97.7°F | Ht 68.5 in | Wt 202.0 lb

## 2019-08-01 DIAGNOSIS — J029 Acute pharyngitis, unspecified: Secondary | ICD-10-CM | POA: Diagnosis not present

## 2019-08-01 DIAGNOSIS — J449 Chronic obstructive pulmonary disease, unspecified: Secondary | ICD-10-CM | POA: Diagnosis not present

## 2019-08-01 LAB — STREP COMPLETE PANEL
Strep Pyogenes: NEGATIVE
Strep dysgalactiae: NEGATIVE

## 2019-08-01 MED ORDER — ANORO ELLIPTA 62.5-25 MCG/INH IN AEPB: 1.0000 | INHALATION_SPRAY | Freq: Every day | RESPIRATORY_TRACT | 3 refills | Status: DC

## 2019-08-01 NOTE — Patient Instructions (Addendum)
  Moderate COPD CONTINUE Anoro 1 puff ONCE a day CONTINUE Albuterol nebulizer and handheld as needed Schedule 6 minute walk test USE flutter valve daily in the morning Consider using if having chest congestion if not improved by flutter valve  Sore throat Check strep test  CT Lung Screen Due in 06/2020  Follow-up with me or NP after 6 MWT Follow-up in 6 months with NP  If you are interested in receiving your Covid-19 vaccine. You can visit [email or web address] and sign up through [facility].  Or if you want to schedule through [facility] Department you can visit Online at [email or web address] or By phone at [phone] (Option 2) from 8:00 am-5:00 pm, until all appointment slots have been filled.

## 2019-08-01 NOTE — Progress Notes (Signed)
Subjective:   PATIENT ID: Carl Lee GENDER: male DOB: 1955/10/17, MRN: [id number]   HPI  Chief Complaint  Patient presents with  . Follow-up    former BQ patient. , wheezing, shortness of breath, cough-yellow-white mucus, throat is red and red spots PCP states not thrush    Reason for Visit: Follow-up   [name] is a 64 year old male former smoker with moderate COPD, OSA on BiPAP, HTN, CABG x 3, CAD, atrial tachycardia, GERD, DM2, hx bladder cancer, triple AAA who presents for follow-up.  Reviewed progress note from 07/23/19 from his Family Medicine PCP, Dr. Danise Mina - Presented for medicare annual wellness visit. For his COPD, documented he is on Breo and albuterol. Has GI hx including Zenker diverticulum s/p diverticulotomy by [facility] and reflux.  He was a former patient of Dr. [name] who managed his COPD. States he is currently on Anoro. Did not tolerate Trelegy due to thrush. Uses nebulizer once a day about 5 times a week because he was told to use it. Not neccessarily because he feels symptomatic. He overall feels his symptoms of shortness of breath and wheezing are controlled on his maintenance inhaler with more good days than bad days. Worsened with strenuous activity and cold air. Does report mild chest tightness/congestion when he wakes up in the morning that improves after productive cough though this is difficult to achieve.  He recently had Zenker diverticulum repaired. He reports sore throat with red redness x 3-4 weeks ago associated with left throat pain. Denies recent fever, chills. Reports cough with minimally productive sputum.  Social History: Former smoker. 44 pack- years.  I have personally reviewed patient's past medical/family/social history, allergies, current medications.  Past Medical History:  Diagnosis Date  . AAA (abdominal aortic aneurysm) ([place]) 03/25/12   s/p endovascular repair  . Adenomatous polyp 06/2009  . Atrial tachycardia,  paroxysmal ([place]) 06/18/2016  . Bladder cancer ([place]) 08/16/2008   recurrence 2015 and 2018 treated with office fulguration  . CAD (coronary artery disease), native coronary artery    S/p 3v CABG 01/2014   . Cataract 2019   bilateral eyes  . Childhood asthma    as a child  . Complication of anesthesia    "takes me a year to get over"  . Contact dermatitis    atypical [name])  . Controlled diabetes mellitus type 2 with complications ([place]) [id number]   CAD   . COPD (chronic obstructive pulmonary disease) ([place])   . Coronary artery disease    a.2015: CABG w/ LIMA-LAD, RIMA-OM, SVG-RCA b. Cath on 07/10/15 w/ patent LIMA-LAD and RIMA-OM. SVG-RCA occluded but collaterals present  . Diverticulosis 2013   severe by CT and colonoscopy  . Environmental allergies    improved as ages  . Fracture of lateral malleolus of left ankle 08/29/2013  . Headache    hasn't had migraines in years  . Hepatic steatosis 06/2015   by [place]  . Hx of migraines   . Hypertension   . Lactose intolerance 11/14/2018   Endorses for years - avoids dairy products  . Lumbosacral radiculopathy at S1 03/2012   left, with spinal stenosis (MRI 04/2013) improved with TF ESI L5/S1 and S1/2 ([place])  . OSA (obstructive sleep apnea) 09/04/2014   Severe with AHI 35/hr, uses BIPAP  . Overweight (BMI 25.0-29.9) 01/26/2017  . Resistance to clopidogrel 2015   drug metabolism panel run - asked to scan  . Spinal stenosis    LS1 nerve  root impingement from bulging disc  . Vitamin B12 deficiency   . Zenker diverticulum      Family History  Problem Relation Age of Onset  . Diabetes Mother   . Arrhythmia Mother        pacemaker  . COPD Mother   . Thyroid disease Mother   . Hyperlipidemia Mother   . Hypertension Mother   . Bladder Cancer Mother   . Diabetes Father   . CAD Father 80       CHF, MI  . Dementia Father   . Heart attack Father   . Diabetes Sister   . Heart attack Maternal Grandmother   . AAA (abdominal  aortic aneurysm) Maternal Grandmother   . Colon cancer Neg Hx   . Stomach cancer Neg Hx      Social History   Occupational History  . Occupation: Self Employed  Tobacco Use  . Smoking status: Former Smoker    Packs/day: 1.00    Years: 44.00    Pack years: 44.00    Types: Cigarettes    Start date: 59    Quit date: 02/05/2014    Years since quitting: 5.4  . Smokeless tobacco: Never Used  Substance and Sexual Activity  . Alcohol use: Yes    Alcohol/week: 0.0 standard drinks    Comment: 3-4 drinks per month  . Drug use: No  . Sexual activity: Not Currently    Allergies  Allergen Reactions  . Codeine Other (See Comments)    Nightmares  . Doxycycline Hives and Other (See Comments)    mild     Outpatient Medications Prior to Visit  Medication Sig Dispense Refill  . acetaminophen (TYLENOL) 325 MG tablet Take 2 tablets (650 mg total) by mouth every 6 (six) hours as needed for moderate pain or headache. 60 tablet 0  . albuterol (PROAIR HFA) 108 (90 Base) MCG/ACT inhaler Inhale 2 puffs into the lungs every 4 (four) hours as needed for wheezing or shortness of breath. 1 g 3  . aspirin EC 81 MG tablet Take 1 tablet (81 mg total) by mouth daily. 90 tablet 1  . atorvastatin (LIPITOR) 20 MG tablet TAKE 1 TABLET (20 MG TOTAL) BY MOUTH DAILY AT 6 PM. 90 tablet 3  . cholecalciferol (VITAMIN D3) 25 MCG (1000 UT) tablet Take 1,000 Units by mouth daily.    . DPH-Lido-AlHydr-MgHydr-Simeth (FIRST-MOUTHWASH BLM) SUSP Use as directed 5 mLs in the mouth or throat 2 (two) times daily as needed. 100 mL 0  . famotidine (PEPCID) 20 MG tablet Take 1 tablet (20 mg total) by mouth 2 (two) times daily. 60 tablet 1  . glucose blood (ONE TOUCH ULTRA TEST) test strip CHECK ONCE DAILY AND AS NEEDED    . ketotifen (ZADITOR) 0.025 % ophthalmic solution Place 1 drop into both eyes daily as needed (allergies). 5 mL 1  . metoprolol tartrate (LOPRESSOR) 25 MG tablet TAKE 0.5 TABLETS (12.5 MG TOTAL) BY MOUTH 2  (TWO) TIMES DAILY. 90 tablet 3  . pantoprazole (PROTONIX) 40 MG tablet Take 1 tablet (40 mg total) by mouth daily. 90 tablet 3  . tamsulosin (FLOMAX) 0.4 MG CAPS capsule Take 1 capsule (0.4 mg total) by mouth daily after supper. 30 capsule 1  . vitamin B-12 (CYANOCOBALAMIN) 500 MCG tablet Take 1 tablet (500 mcg total) by mouth daily.    [name] umeclidinium-vilanterol (ANORO ELLIPTA) 62.5-25 MCG/INH AEPB     . ipratropium-albuterol (DUONEB) 0.5-2.5 (3) MG/3ML SOLN Take 3 mLs by nebulization every  6 (six) hours as needed. 360 mL 0  . oxyCODONE (OXY IR/ROXICODONE) 5 MG immediate release tablet Take 5 mg by mouth every 4 (four) hours as needed.    [name] amLODipine (NORVASC) 5 MG tablet Take 1 tablet (5 mg total) by mouth daily. 30 tablet 1  . fluticasone furoate-vilanterol (BREO ELLIPTA) 100-25 MCG/INH AEPB Inhale 1 puff into the lungs daily.     No facility-administered medications prior to visit.    Review of Systems  Constitutional: Negative for chills, diaphoresis, fever, malaise/fatigue and weight loss.  HENT: Positive for sore throat. Negative for congestion.   Respiratory: Positive for cough and sputum production. Negative for hemoptysis, shortness of breath and wheezing.   Cardiovascular: Positive for chest pain. Negative for palpitations and leg swelling.     Objective:   Vitals:   08/01/19 1118  BP: 122/70  Pulse: 75  Temp: 97.7 F (36.5 C)  TempSrc: Temporal  SpO2: 90%  Weight: 202 lb (91.6 kg)  Height: 5' 8.5" (1.74 m)   SpO2: 90 % O2 Device: None (Room air) Physical Exam: General: Well-appearing, no acute distress HENT: [place], AT Eyes: EOMI, no scleral icterus Respiratory: Clear to auscultation bilaterally.  No crackles, wheezing or rales Cardiovascular: RRR, -M/R/G, no JVD GI: BS+, soft, nontender Extremities:-Edema,-tenderness Neuro: AAO x4, CNII-XII grossly intact Skin: Intact, no rashes or bruising Psych: Normal mood, normal affect  Data Reviewed:  Imaging: CT  Chest Lung Screen 07/20/19 - Centrilobular and paraseptal emphysema. Tiny pulmonary nodules.  PFT: 11/29/17 FVC 3.0 (66%) FEV1 1.7 (49%) Ratio 56  Interpretation: Severe obstructive defect present  Labs: CBC    Component Value Date/Time   WBC 9.3 07/17/2019 0829   RBC 5.31 07/17/2019 0829   HGB 16.7 07/17/2019 0829   HGB 16.7 01/31/2019 0940   HCT 49.3 07/17/2019 0829   HCT 47.5 01/31/2019 0940   PLT 237.0 07/17/2019 0829   PLT 255 01/31/2019 0940   MCV 93.0 07/17/2019 0829   MCV 93 01/31/2019 0940   MCV 96 01/09/2014 1237   MCH 32.3 04/21/2019 0349   MCHC 33.8 07/17/2019 0829   RDW 14.8 07/17/2019 0829   RDW 12.6 01/31/2019 0940   RDW 13.5 01/09/2014 1237   LYMPHSABS 2.9 07/17/2019 0829   LYMPHSABS 2.8 01/31/2019 0940   MONOABS 0.8 07/17/2019 0829   EOSABS 0.5 07/17/2019 0829   EOSABS 0.1 01/31/2019 0940   BASOSABS 0.1 07/17/2019 0829   BASOSABS 0.1 01/31/2019 0940   Imaging, labs and test noted above have been reviewed independently by me.    Assessment & Plan:   Discussion: 64 year old male former smoker with moderate COPD, OSA on BiPAP, HTN, CABG x 3, CAD, atrial tachycardia, GERD, DM2, hx bladder cancer, triple AAA who presents for follow-up. Spent time reviewed PFTs and CT scan as noted above with patient discussing the clinical course of his COPD. Will arrange for 6MWT due to his low-normal oxygen level as soon as possible. I suspect that if he does have hypoxemia this may be related to V/Q mismatch +/- uncontrolled OSA.   Moderate COPD CONTINUE Anoro 1 puff ONCE a day CONTINUE Albuterol nebulizer and handheld as needed Schedule 6 minute walk test USE flutter valve daily in the morning Consider using rescue inhaler if having chest congestion if not improved by flutter valve  Sore throat Check strep test ADDENDUM: Negative. Patient updated via phone by me.  OSA Discuss at next visit  CT Lung Screen Due in 06/2020  Follow-up with me or  NP after 6  MWT Follow-up in 6 months with NP  Health Maintenance Immunization History  Administered Date(s) Administered  . Influenza,inj,Quad PF,6+ Mos 04/17/2013, 06/17/2014, 05/01/2015, 03/25/2016, 04/01/2017, 07/14/2018, 03/22/2019  . Pneumococcal Conjugate-13 05/01/2015  . Pneumococcal Polysaccharide-23 06/17/2014  . Tdap 12/14/2012  . Zoster 12/23/2015   CT Lung Screen - Due 06/2020  Orders Placed This Encounter  Procedures  . Strep Complete Panel  . 6 minute walk    Standing Status:   Future    Standing Expiration Date:   07/31/2020   Meds ordered this encounter  Medications  . umeclidinium-vilanterol (ANORO ELLIPTA) 62.5-25 MCG/INH AEPB    Sig: Inhale 1 puff into the lungs daily.    Dispense:  60 each    Refill:  3    Return in about 6 months (around 01/29/2020).  I have spent a total time of 42-minutes on the day of the appointment reviewing prior documentation, coordinating care and discussing medical diagnosis and plan with the patient/family. Imaging, labs and tests included in this note have been reviewed and interpreted independently by me.  [place], MD [facility] 08/01/2019 8:58 PM  Office Number [phone]

## 2019-08-06 NOTE — Progress Notes (Signed)
@Patient  ID: Carl Lee, male    DOB: 1956-04-11, 64 y.o.   MRN: [id number]  Chief Complaint  Patient presents with  . Follow-up    SOB    Referring provider: [name], MD  HPI:  64 year old male former smoker followed in our office for COPD  PMH: Hypertension, history of AAA, arthritis, status post CABG x3, hyperlipidemia, atrial tachycardia, obesity, cataracts bilaterally, GERD Smoker/ Smoking History: Former smoker Maintenance: Anoro Ellipta Pt of: Dr. Loanne Drilling  08/07/2019  - Visit   64 year old male former smoker followed in our office for COPD.  Patient was previously established with Dr. [name] and has recently established with Dr. Loanne Drilling in January/2021.  Patient completed a 6-minute walk today where he did have a desaturation down to 83% on room air.  Patient recovered over the following 1 to 2 minutes and oxygen saturations recovered above 88 to 90%.  Patient reports that he has no issues using his Anoro Ellipta.  He reports adherence to this.  He does use his rescue inhaler about 3 times a week.  Patient does continue to make chronic dyspnea on exertion that resolves after stopping and resting for 1 to 2 minutes.  Patient reports that he is still able to be fairly active and do what he would like to do.  He is unsure if he is interested in considering oxygen  Questionaires / Pulmonary Flowsheets:   MMRC: mMRC Dyspnea Scale mMRC Score  08/07/2019 1    Tests:   07/17/2019-CBC with differential-eosinophils relative 5.6, eosinophils absolute 0.5  07/20/2019-CT chest lung cancer screening-lung RADS 2, benign appearance or behavior, continue annual screening with low-dose CT, centrilobular and paraseptal emphysema evident  01/31/2019-echocardiogram-LV ejection fraction-60 to 65%, severe hypokinesis of left ventricle, right ventricle is normal systolic function  FENO:  No results found for: NITRICOXIDE  PFT: PFT Results Latest Ref Rng & Units 11/03/2015  02/06/2014  FVC-Pre L 3.05 3.34  FVC-Predicted Pre % 64 67  FVC-Post L 3.04 3.32  FVC-Predicted Post % 64 67  Pre FEV1/FVC % % 63 62  Post FEV1/FCV % % 62 63  FEV1-Pre L 1.92 2.08  FEV1-Predicted Pre % 54 55  FEV1-Post L 1.88 2.10  DLCO UNC% % 39 -  DLCO COR %Predicted % 49 -  TLC L 6.94 6.35  TLC % Predicted % 99 88  RV % Predicted % 155 120    WALK:  SIX MIN WALK 08/07/2019 08/07/2019  Supplimental Oxygen during Test? (L/min) No No  Laps - 11  Partial Lap (in Meters) - 0  Baseline BP (sitting) - 124/64  Baseline Heartrate - 55  Baseline Dyspnea (Borg Scale) - 3  Baseline Fatigue (Borg Scale) - 4  Baseline SPO2 - 97  BP (sitting) - 152/80  Heartrate - 90  Dyspnea (Borg Scale) - 3  Fatigue (Borg Scale) - 4  SPO2 - 83  BP (sitting) - 163/62  Heartrate - 59  SPO2 - 97  Stopped or Paused before Six Minutes - No  Interpretation - Hip pain  Distance Completed - 374  Tech Comments: moderate pace walk, sats did not drop below 88%, recovered quickly to 94% after walk, no SOB or other symptoms Pt did desat after 6 mins to 83% but recovered at 97% after 2 mins. TA/CMA    Imaging: CT CHEST LUNG CANCER SCREENING LOW DOSE WO CONTRAST  Result Date: 07/21/2019 CLINICAL DATA:  64 year old male with 40 pack-year history of smoking. Lung cancer screening. EXAM: CT CHEST WITHOUT  CONTRAST LOW-DOSE FOR LUNG CANCER SCREENING TECHNIQUE: Multidetector CT imaging of the chest was performed following the standard protocol without IV contrast. COMPARISON:  07/18/2018 FINDINGS: Cardiovascular: The heart size is normal. No substantial pericardial effusion. Coronary artery calcification is evident. The patient is status post CABG. Atherosclerotic calcification is noted in the wall of the thoracic aorta. Mediastinum/Nodes: No mediastinal lymphadenopathy. No evidence for gross hilar lymphadenopathy although assessment is limited by the lack of intravenous contrast on today's study. The esophagus has normal  imaging features. There is no axillary lymphadenopathy. Lungs/Pleura: Centrilobular and paraseptal emphysema evident. Tiny pulmonary nodules identified on the previous study are stable in the interval. No new suspicious pulmonary nodule or mass. No focal airspace consolidation. No pleural effusion. Upper Abdomen: Upper portion of a aortic endograft noted. Otherwise unremarkable. Musculoskeletal: No worrisome lytic or sclerotic osseous abnormality. IMPRESSION: Lung-RADS 2, benign appearance or behavior. Continue annual screening with low-dose chest CT without contrast in 12 months. Aortic Atherosclerois (ICD10-170.0) Emphysema. [id number]) Electronically Signed   By: Misty Stanley M.D.   On: 07/21/2019 06:52    Lab Results:  CBC    Component Value Date/Time   WBC 9.3 07/17/2019 0829   RBC 5.31 07/17/2019 0829   HGB 16.7 07/17/2019 0829   HGB 16.7 01/31/2019 0940   HCT 49.3 07/17/2019 0829   HCT 47.5 01/31/2019 0940   PLT 237.0 07/17/2019 0829   PLT 255 01/31/2019 0940   MCV 93.0 07/17/2019 0829   MCV 93 01/31/2019 0940   MCV 96 01/09/2014 1237   MCH 32.3 04/21/2019 0349   MCHC 33.8 07/17/2019 0829   RDW 14.8 07/17/2019 0829   RDW 12.6 01/31/2019 0940   RDW 13.5 01/09/2014 1237   LYMPHSABS 2.9 07/17/2019 0829   LYMPHSABS 2.8 01/31/2019 0940   MONOABS 0.8 07/17/2019 0829   EOSABS 0.5 07/17/2019 0829   EOSABS 0.1 01/31/2019 0940   BASOSABS 0.1 07/17/2019 0829   BASOSABS 0.1 01/31/2019 0940    BMET    Component Value Date/Time   NA 139 07/17/2019 0829   NA 142 01/31/2019 0940   NA 138 01/09/2014 1237   K 4.4 07/17/2019 0829   K 3.7 01/09/2014 1237   CL 102 07/17/2019 0829   CL 104 01/09/2014 1237   CO2 28 07/17/2019 0829   CO2 27 01/09/2014 1237   GLUCOSE 113 (H) 07/17/2019 0829   GLUCOSE 83 01/09/2014 1237   BUN 13 07/17/2019 0829   BUN 15 01/31/2019 0940   BUN 19 (H) 01/09/2014 1237   CREATININE 0.74 07/17/2019 0829   CREATININE 0.82 11/10/2018 1602   CALCIUM 9.4  07/17/2019 0829   CALCIUM 8.6 01/09/2014 1237   GFRNONAA >60 04/21/2019 0349   GFRNONAA >60 01/09/2014 1237   GFRAA >60 04/21/2019 0349   GFRAA >60 01/09/2014 1237    BNP    Component Value Date/Time   BNP 87 11/10/2018 1602    ProBNP No results found for: PROBNP  Specialty Problems      Pulmonary Problems   COPD, moderate ([place])    July 2015 pulmonary function testing> ratio 62%, FEV1 2.10 L (56% predicted), total lung capacity 6.35 L (80% predicted) April 2017 pulmonary function testing: FEV1 1.8 L, total lung capacity 99% predicted, DLCO 39% predicted 11/12/2015 TTE > normal LVEF, RV mildly dilated and systolic function mildly reduced By CT 07/2018 mild centrilobular and paraseptal emphysema      Zenker diverticulum    Esophageal manometry WNL 07/2017 S/p diverticulotomy Okey Dupre at [facility]) 08/2017 Recurrent and  enlarging 12/2018 seeing [facility] Wilburn Cornelia) referred to [facility] Dr Harl Bowie  S/p endoscopic diverticulotomy [facility] ([place]) 06/2019      OSA (obstructive sleep apnea)    Severe with AHI 35/hr      Dyspnea   Sore throat   Cough      Allergies  Allergen Reactions  . Codeine Other (See Comments)    Nightmares  . Doxycycline Hives and Other (See Comments)    mild    Immunization History  Administered Date(s) Administered  . Influenza,inj,Quad PF,6+ Mos 04/17/2013, 06/17/2014, 05/01/2015, 03/25/2016, 04/01/2017, 07/14/2018, 03/22/2019  . Pneumococcal Conjugate-13 05/01/2015  . Pneumococcal Polysaccharide-23 06/17/2014, 08/07/2019  . Tdap 12/14/2012  . Zoster 12/23/2015   Due for Pneumovax 23  Past Medical History:  Diagnosis Date  . AAA (abdominal aortic aneurysm) ([place]) 03/25/12   s/p endovascular repair  . Adenomatous polyp 06/2009  . Atrial tachycardia, paroxysmal ([place]) 06/18/2016  . Bladder cancer ([place]) 08/16/2008   recurrence 2015 and 2018 treated with office fulguration  . CAD (coronary artery disease), native coronary artery    S/p 3v CABG 01/2014    . Cataract 2019   bilateral eyes  . Childhood asthma    as a child  . Complication of anesthesia    "takes me a year to get over"  . Contact dermatitis    atypical [name])  . Controlled diabetes mellitus type 2 with complications ([place]) [id number]   CAD   . COPD (chronic obstructive pulmonary disease) ([place])   . Coronary artery disease    a.2015: CABG w/ LIMA-LAD, RIMA-OM, SVG-RCA b. Cath on 07/10/15 w/ patent LIMA-LAD and RIMA-OM. SVG-RCA occluded but collaterals present  . Diverticulosis 2013   severe by CT and colonoscopy  . Environmental allergies    improved as ages  . Fracture of lateral malleolus of left ankle 08/29/2013  . Headache    hasn't had migraines in years  . Hepatic steatosis 06/2015   by [place]  . Hx of migraines   . Hypertension   . Lactose intolerance 11/14/2018   Endorses for years - avoids dairy products  . Lumbosacral radiculopathy at S1 03/2012   left, with spinal stenosis (MRI 04/2013) improved with TF ESI L5/S1 and S1/2 ([place])  . OSA (obstructive sleep apnea) 09/04/2014   Severe with AHI 35/hr, uses BIPAP  . Overweight (BMI 25.0-29.9) 01/26/2017  . Resistance to clopidogrel 2015   drug metabolism panel run - asked to scan  . Spinal stenosis    LS1 nerve root impingement from bulging disc  . Vitamin B12 deficiency   . Zenker diverticulum     Tobacco History: Social History   Tobacco Use  Smoking Status Former Smoker  . Packs/day: 1.00  . Years: 44.00  . Pack years: 44.00  . Types: Cigarettes  . Start date: 11  . Quit date: 02/05/2014  . Years since quitting: 5.5  Smokeless Tobacco Never Used   Counseling given: Not Answered   Continue to not smoke  Outpatient Encounter Medications as of 08/07/2019  Medication Sig  . acetaminophen (TYLENOL) 325 MG tablet Take 2 tablets (650 mg total) by mouth every 6 (six) hours as needed for moderate pain or headache.  . albuterol (PROAIR HFA) 108 (90 Base) MCG/ACT inhaler Inhale 2 puffs into  the lungs every 4 (four) hours as needed for wheezing or shortness of breath.  [name] aspirin EC 81 MG tablet Take 1 tablet (81 mg total) by mouth daily.  [name] atorvastatin (LIPITOR) 20 MG  tablet TAKE 1 TABLET (20 MG TOTAL) BY MOUTH DAILY AT 6 PM.  . cholecalciferol (VITAMIN D3) 25 MCG (1000 UT) tablet Take 1,000 Units by mouth daily.  . DPH-Lido-AlHydr-MgHydr-Simeth (FIRST-MOUTHWASH BLM) SUSP Use as directed 5 mLs in the mouth or throat 2 (two) times daily as needed.  . famotidine (PEPCID) 20 MG tablet Take 1 tablet (20 mg total) by mouth 2 (two) times daily.  [name] glucose blood (ONE TOUCH ULTRA TEST) test strip CHECK ONCE DAILY AND AS NEEDED  . ketotifen (ZADITOR) 0.025 % ophthalmic solution Place 1 drop into both eyes daily as needed (allergies).  . metoprolol tartrate (LOPRESSOR) 25 MG tablet TAKE 0.5 TABLETS (12.5 MG TOTAL) BY MOUTH 2 (TWO) TIMES DAILY.  [name] oxyCODONE (OXY IR/ROXICODONE) 5 MG immediate release tablet Take 5 mg by mouth every 4 (four) hours as needed.  . pantoprazole (PROTONIX) 40 MG tablet Take 1 tablet (40 mg total) by mouth daily.  . tamsulosin (FLOMAX) 0.4 MG CAPS capsule Take 1 capsule (0.4 mg total) by mouth daily after supper.  . umeclidinium-vilanterol (ANORO ELLIPTA) 62.5-25 MCG/INH AEPB Inhale 1 puff into the lungs daily.  . vitamin B-12 (CYANOCOBALAMIN) 500 MCG tablet Take 1 tablet (500 mcg total) by mouth daily.  [name] ipratropium-albuterol (DUONEB) 0.5-2.5 (3) MG/3ML SOLN Take 3 mLs by nebulization every 6 (six) hours as needed.   No facility-administered encounter medications on file as of 08/07/2019.     Review of Systems  Review of Systems  Constitutional: Negative for activity change, chills, fatigue, fever and unexpected weight change.  HENT: Negative for postnasal drip, rhinorrhea, sinus pressure, sinus pain and sore throat.   Eyes: Negative.   Respiratory: Positive for shortness of breath. Negative for cough and wheezing.   Cardiovascular: Negative for chest pain and  palpitations.  Gastrointestinal: Negative for constipation, diarrhea, nausea and vomiting.  Endocrine: Negative.   Genitourinary: Negative.   Musculoskeletal: Negative.   Skin: Negative.   Neurological: Negative for dizziness and headaches.  Psychiatric/Behavioral: Negative.  Negative for dysphoric mood. The patient is not nervous/anxious.   All other systems reviewed and are negative.    Physical Exam  BP 124/64 (BP Location: Right Arm, Cuff Size: Normal)   Pulse (!) 55   Temp 98.1 F (36.7 C) (Oral)   Ht 5' 8.5" (1.74 m)   Wt 202 lb 3.2 oz (91.7 kg)   SpO2 97% Comment: RA  BMI 30.30 kg/m   Wt Readings from Last 5 Encounters:  08/07/19 202 lb 3.2 oz (91.7 kg)  08/01/19 202 lb (91.6 kg)  07/23/19 199 lb 7 oz (90.5 kg)  07/20/19 202 lb (91.6 kg)  05/17/19 202 lb 1 oz (91.7 kg)    BMI Readings from Last 5 Encounters:  08/07/19 30.30 kg/m  08/01/19 30.27 kg/m  07/23/19 29.88 kg/m  07/20/19 28.98 kg/m  05/17/19 28.99 kg/m     Physical Exam Vitals and nursing note reviewed.  Constitutional:      General: He is not in acute distress.    Appearance: Normal appearance. He is obese.  HENT:     Head: Normocephalic and atraumatic.     Right Ear: Hearing, tympanic membrane, ear canal and external ear normal. There is no impacted cerumen.     Left Ear: Hearing, tympanic membrane, ear canal and external ear normal. There is no impacted cerumen.     Nose: Nose normal. No mucosal edema, congestion or rhinorrhea.     Right Turbinates: Not enlarged.     Left Turbinates: Not  enlarged.     Mouth/Throat:     Mouth: Mucous membranes are dry.     Pharynx: Oropharynx is clear. No oropharyngeal exudate.  Eyes:     Pupils: Pupils are equal, round, and reactive to light.  Cardiovascular:     Rate and Rhythm: Normal rate and regular rhythm.     Pulses: Normal pulses.     Heart sounds: Normal heart sounds. No murmur.  Pulmonary:     Effort: Pulmonary effort is normal.      Breath sounds: Normal breath sounds. No decreased breath sounds, wheezing or rales.  Abdominal:     General: Bowel sounds are normal. There is no distension.     Palpations: Abdomen is soft.     Tenderness: There is no abdominal tenderness.  Musculoskeletal:     Cervical back: Normal range of motion.     Right lower leg: No edema.     Left lower leg: No edema.  Lymphadenopathy:     Cervical: No cervical adenopathy.  Skin:    General: Skin is warm and dry.     Capillary Refill: Capillary refill takes less than 2 seconds.     Findings: No erythema or rash.  Neurological:     General: No focal deficit present.     Mental Status: He is alert and oriented to person, place, and time.     Motor: No weakness.     Coordination: Coordination normal.     Gait: Gait is intact. Gait normal.  Psychiatric:        Mood and Affect: Mood normal.        Behavior: Behavior normal. Behavior is cooperative.        Thought Content: Thought content normal.        Judgment: Judgment normal.       Assessment & Plan:   Hypoxemia Discussion: I had an extended discussion with the patient regarding the hypoxemia with physical exertion.  This was also noted at last office visit which prompted the 6-minute walk to be scheduled.  During the 6-minute walk that patient completed today oxygen saturations did drop to 83% on room air after sitting for 1 to 2 minutes oxygen saturations did return above 88%.  Patient believes there is multiple factors that contribute to this such as nasal congestion, wearing a mask and the humidity.  Patient does have severe diffusion defect on 2017 pulmonary function testing.  Patient is also followed in our office for COPD.  I explained to the patient that with his oxygen saturations dropping below 88% with physical exertion this would be on the second attempt that we would recommend considering supplemental oxygen with physical exertion to maintain oxygen saturations above 88%.   Patient is not interested in continuous oxygen or tanks.  He is willing to consider portable oxygen concentrator's if they are affordable and easy to access.  Plan: Walk today in office for POC to see if oxygen levels drop, on repeat walk oxygen levels did not drop below 88%.  Will continue to monitor clinically  Patient is to contact our office with any concerns   OSA (obstructive sleep apnea) Plan: Continue follow-up with cardiology Continue BiPAP  COPD, moderate ([place]) Plan: Continue Anoro Ellipta 6-minute walk today revealed exertional hypoxemia Patient walked again for [place] qualification, did not have oxygen desaturations on repeat walk Follow-up with our office in 3 to 6 months  Healthcare maintenance Plan: Patient due for Pneumovax 23, offered today  Continue annual lung cancer screening  CT in January/2022    Return in about 6 months (around 02/04/2020), or if symptoms worsen or fail to improve, for Follow up with Dr. Loanne Drilling.   Lauraine Rinne, NP 08/07/2019   This appointment required 45 minutes of patient care (this includes precharting, chart review, review of results, face-to-face care, etc.).

## 2019-08-07 ENCOUNTER — Ambulatory Visit (INDEPENDENT_AMBULATORY_CARE_PROVIDER_SITE_OTHER): Payer: PPO

## 2019-08-07 ENCOUNTER — Ambulatory Visit (INDEPENDENT_AMBULATORY_CARE_PROVIDER_SITE_OTHER): Payer: PPO | Admitting: Pulmonary Disease

## 2019-08-07 ENCOUNTER — Encounter: Payer: Self-pay | Admitting: Pulmonary Disease

## 2019-08-07 ENCOUNTER — Other Ambulatory Visit: Payer: Self-pay

## 2019-08-07 VITALS — BP 124/64 | HR 55 | Temp 98.1°F | Ht 68.5 in | Wt 202.2 lb

## 2019-08-07 DIAGNOSIS — Z23 Encounter for immunization: Secondary | ICD-10-CM | POA: Diagnosis not present

## 2019-08-07 DIAGNOSIS — G4733 Obstructive sleep apnea (adult) (pediatric): Secondary | ICD-10-CM | POA: Diagnosis not present

## 2019-08-07 DIAGNOSIS — Z0000 Encounter for general adult medical examination without abnormal findings: Secondary | ICD-10-CM | POA: Diagnosis not present

## 2019-08-07 DIAGNOSIS — R0902 Hypoxemia: Secondary | ICD-10-CM | POA: Diagnosis not present

## 2019-08-07 DIAGNOSIS — J449 Chronic obstructive pulmonary disease, unspecified: Secondary | ICD-10-CM | POA: Diagnosis not present

## 2019-08-07 NOTE — Assessment & Plan Note (Signed)
Plan: Continue follow-up with cardiology Continue BiPAP

## 2019-08-07 NOTE — Assessment & Plan Note (Addendum)
Plan: Patient due for Pneumovax 23, offered today  Continue annual lung cancer screening CT in January/2022

## 2019-08-07 NOTE — Assessment & Plan Note (Addendum)
Discussion: I had an extended discussion with the patient regarding the hypoxemia with physical exertion.  This was also noted at last office visit which prompted the 6-minute walk to be scheduled.  During the 6-minute walk that patient completed today oxygen saturations did drop to 83% on room air after sitting for 1 to 2 minutes oxygen saturations did return above 88%.  Patient believes there is multiple factors that contribute to this such as nasal congestion, wearing a mask and the humidity.  Patient does have severe diffusion defect on 2017 pulmonary function testing.  Patient is also followed in our office for COPD.  I explained to the patient that with his oxygen saturations dropping below 88% with physical exertion this would be on the second attempt that we would recommend considering supplemental oxygen with physical exertion to maintain oxygen saturations above 88%.  Patient is not interested in continuous oxygen or tanks.  He is willing to consider portable oxygen concentrator's if they are affordable and easy to access.  Plan: Walk today in office for POC to see if oxygen levels drop, on repeat walk oxygen levels did not drop below 88%.  Will continue to monitor clinically  Patient is to contact our office with any concerns

## 2019-08-07 NOTE — Assessment & Plan Note (Addendum)
Plan: Continue Anoro Ellipta 6-minute walk today revealed exertional hypoxemia Patient walked again for POC qualification, did not have oxygen desaturations on repeat walk Follow-up with our office in 3 to 6 months

## 2019-08-07 NOTE — Progress Notes (Signed)
SIX MIN WALK 08/07/2019  Supplimental Oxygen during Test? (L/min) No  Laps 11  Partial Lap (in Meters) 0  Baseline BP (sitting) 124/64  Baseline Heartrate 55  Baseline Dyspnea (Borg Scale) 3  Baseline Fatigue (Borg Scale) 4  Baseline SPO2 97  BP (sitting) 152/80  Heartrate 90  Dyspnea (Borg Scale) 3  Fatigue (Borg Scale) 4  SPO2 83  BP (sitting) 163/62  Heartrate 59  SPO2 97  Stopped or Paused before Six Minutes No  Interpretation Hip pain  Distance Completed 374  Tech Comments: Pt did desat after 6 mins to 83% but recovered at 97% after 2 mins. TA/CMA

## 2019-08-07 NOTE — Patient Instructions (Addendum)
You were seen today by Lauraine Rinne, NP  for:   1. COPD, moderate (HCC)  Anoro Ellipta  >>> Take 1 puff daily in the morning right when you wake up >>>Rinse your mouth out after use >>>This is a daily maintenance inhaler, NOT a rescue inhaler >>>Contact our office if you are having difficulties affording or obtaining this medication >>>It is important for you to be able to take this daily and not miss any doses    Only use your albuterol as a rescue medication to be used if you can't catch your breath by resting or doing a relaxed purse lip breathing pattern.  - The less you use it, the better it will work when you need it. - Ok to use up to 2 puffs  every 4 hours if you must but call for immediate appointment if use goes up over your usual need - Don't leave home without it !!  (think of it like the spare tire for your car)   Note your daily symptoms > remember "red flags" for COPD:   >>>Increase in cough >>>increase in sputum production >>>increase in shortness of breath or activity  intolerance.   If you notice these symptoms, please call the office to be seen.    2. OSA (obstructive sleep apnea)  Continue to follow-up with cardiology as they your BiPAP  We recommend that you continue using your BIPAP daily >>>Keep up the hard work using your device >>> Goal should be wearing this for the entire night that you are sleeping, at least 4 to 6 hours  Remember:  . Do not drive or operate heavy machinery if tired or drowsy.  . Please notify the supply company and office if you are unable to use your device regularly due to missing supplies or machine being broken.  . Work on maintaining a healthy weight and following your recommended nutrition plan  . Maintain proper daily exercise and movement  . Maintaining proper use of your device can also help improve management of other chronic illnesses such as: Blood pressure, blood sugars, and weight management.   BiPAP/ CPAP Cleaning:    >>>Clean weekly, with Dawn soap, and bottle brush.  Set up to air dry. >>> Wipe mask out daily with wet wipe or towelette    3. Healthcare maintenance  Pneumovax 23 today  4. Hypoxemia  6-minute walk today revealed drops in your oxygen levels with physical exertion down to 83% on room air  Walk today in office for portable oxygen concentrator revealed no oxygen saturations below 88%.  The lowest he dropped was 88%.  Continue to monitor your oxygen levels on your own.  Please notify our office if oxygen levels are below 88%  We recommend maintaining oxygen saturations above 88%  Follow Up:    Return in about 6 months (around 02/04/2020), or if symptoms worsen or fail to improve, for Follow up with Dr. Loanne Drilling.   Please do your part to reduce the spread of COVID-19:      Reduce your risk of any infection  and COVID19 by using the similar precautions used for avoiding the common cold or flu:  [name] Wash your hands often with soap and warm water for at least 20 seconds.  If soap and water are not readily available, use an alcohol-based hand sanitizer with at least 60% alcohol.  . If coughing or sneezing, cover your mouth and nose by coughing or sneezing into the elbow areas of your shirt or  coat, into a tissue or into your sleeve (not your hands). Langley Gauss A MASK when in public  . Avoid shaking hands with others and consider head nods or verbal greetings only. . Avoid touching your eyes, nose, or mouth with unwashed hands.  . Avoid close contact with people who are sick. . Avoid places or events with large numbers of people in one location, like concerts or sporting events. . If you have some symptoms but not all symptoms, continue to monitor at home and seek medical attention if your symptoms worsen. . If you are having a medical emergency, call 911.   [place] / e-Visit: [email or web address]          [facility]: [facility]: [id number]                   [facility]: [id number]     It is flu season:   >>> Best ways to protect herself from the flu: Receive the yearly flu vaccine, practice good hand hygiene washing with soap and also using hand sanitizer when available, eat a nutritious meals, get adequate rest, hydrate appropriately   Please contact the office if your symptoms worsen or you have concerns that you are not improving.   Thank you for choosing [facility] for your healthcare, and for allowing [place] to partner with you on your healthcare journey. I am thankful to be able to provide care to you today.   Wyn Quaker FNP-C  Pneumococcal Vaccine, Polyvalent solution for injection What is this medicine? PNEUMOCOCCAL VACCINE, POLYVALENT (NEU mo KOK al vak SEEN, pol ee VEY luhnt) is a vaccine to prevent pneumococcus bacteria infection. These bacteria are a major cause of ear infections, Strep throat infections, and serious pneumonia, meningitis, or blood infections worldwide. These vaccines help the body to produce antibodies (protective substances) that help your body defend against these bacteria. This vaccine is recommended for people 24 years of age and older with health problems. It is also recommended for all adults over 44 years old. This vaccine will not treat an infection. This medicine may be used for other purposes; ask your health care provider or pharmacist if you have questions. COMMON BRAND NAME(S): Pneumovax 23 What should I tell my health care provider before I take this medicine? They need to know if you have any of these conditions:  bleeding problems  bone marrow or organ transplant  cancer, Hodgkin's disease  fever  infection  immune system problems  low platelet count in the blood  seizures  an unusual or allergic reaction to pneumococcal vaccine, diphtheria  toxoid, other vaccines, latex, other medicines, foods, dyes, or preservatives  pregnant or trying to get pregnant  breast-feeding How should I use this medicine? This vaccine is for injection into a muscle or under the skin. It is given by a health care professional. A copy of Vaccine Information Statements will be given before each vaccination. Read this sheet carefully each time. The sheet may change frequently. Talk to your pediatrician regarding the use of this medicine in children. While this drug may be prescribed for children as young as 28 years of age for selected conditions, precautions do apply. Overdosage: If you think you have taken too much of this medicine contact a poison control center or emergency room at once. NOTE: This medicine is only for you. Do not share this medicine with others. What if  I miss a dose? It is important not to miss your dose. Call your doctor or health care professional if you are unable to keep an appointment. What may interact with this medicine?  medicines for cancer chemotherapy  medicines that suppress your immune function  medicines that treat or prevent blood clots like warfarin, enoxaparin, and dalteparin  steroid medicines like prednisone or cortisone This list may not describe all possible interactions. Give your health care provider a list of all the medicines, herbs, non-prescription drugs, or dietary supplements you use. Also tell them if you smoke, drink alcohol, or use illegal drugs. Some items may interact with your medicine. What should I watch for while using this medicine? Mild fever and pain should go away in 3 days or less. Report any unusual symptoms to your doctor or health care professional. What side effects may I notice from receiving this medicine? Side effects that you should report to your doctor or health care professional as soon as possible:  allergic reactions like skin rash, itching or hives, swelling of the face,  lips, or tongue  breathing problems  confused  fever over 102 degrees F  pain, tingling, numbness in the hands or feet  seizures  unusual bleeding or bruising  unusual muscle weakness Side effects that usually do not require medical attention (report to your doctor or health care professional if they continue or are bothersome):  aches and pains  diarrhea  fever of 102 degrees F or less  headache  irritable  loss of appetite  pain, tender at site where injected  trouble sleeping This list may not describe all possible side effects. Call your doctor for medical advice about side effects. You may report side effects to FDA at 1-800-FDA-1088. Where should I keep my medicine? This does not apply. This vaccine is given in a clinic, pharmacy, doctor's office, or other health care setting and will not be stored at home. NOTE: This sheet is a summary. It may not cover all possible information. If you have questions about this medicine, talk to your doctor, pharmacist, or health care provider.  2020 Elsevier/Gold Standard (2008-02-02 14:32:37)

## 2019-08-13 DIAGNOSIS — G4733 Obstructive sleep apnea (adult) (pediatric): Secondary | ICD-10-CM | POA: Diagnosis not present

## 2019-08-13 DIAGNOSIS — J449 Chronic obstructive pulmonary disease, unspecified: Secondary | ICD-10-CM | POA: Diagnosis not present

## 2019-08-19 ENCOUNTER — Other Ambulatory Visit: Payer: Self-pay | Admitting: Family Medicine

## 2019-08-22 ENCOUNTER — Telehealth: Payer: Self-pay | Admitting: Family Medicine

## 2019-08-22 NOTE — Chronic Care Management (AMB) (Signed)
  Chronic Care Management   Note  08/22/2019 Name: Carl Lee MRN: [id number] DOB: 04-15-1956  Carl Lee is a 64 y.o. year old male who is a primary care patient of [name], MD. I reached out to [name] by phone today in response to a referral sent by Mr. Kathleene Hazel Robledo's PCP, [name], MD.   Mr. Rambert was given information about Chronic Care Management services today including:  1. CCM service includes personalized support from designated clinical staff supervised by his physician, including individualized plan of care and coordination with other care providers 2. 24/7 contact phone numbers for assistance for urgent and routine care needs. 3. Service will only be billed when office clinical staff spend 20 minutes or more in a month to coordinate care. 4. Only one practitioner may furnish and bill the service in a calendar month. 5. The patient may stop CCM services at any time (effective at the end of the month) by phone call to the office staff. 6. The patient will be responsible for cost sharing (co-pay) of up to 20% of the service fee (after annual deductible is met).  Patient agreed to services and verbal consent obtained.   Follow up plan:   Raynicia Dukes [employer] Scheduler

## 2019-08-27 ENCOUNTER — Ambulatory Visit: Payer: PPO

## 2019-08-27 ENCOUNTER — Other Ambulatory Visit: Payer: Self-pay

## 2019-08-27 DIAGNOSIS — K219 Gastro-esophageal reflux disease without esophagitis: Secondary | ICD-10-CM

## 2019-08-27 DIAGNOSIS — E785 Hyperlipidemia, unspecified: Secondary | ICD-10-CM

## 2019-08-27 DIAGNOSIS — E1169 Type 2 diabetes mellitus with other specified complication: Secondary | ICD-10-CM

## 2019-08-27 DIAGNOSIS — E118 Type 2 diabetes mellitus with unspecified complications: Secondary | ICD-10-CM

## 2019-08-27 DIAGNOSIS — J449 Chronic obstructive pulmonary disease, unspecified: Secondary | ICD-10-CM

## 2019-08-27 DIAGNOSIS — M1612 Unilateral primary osteoarthritis, left hip: Secondary | ICD-10-CM

## 2019-08-27 DIAGNOSIS — I10 Essential (primary) hypertension: Secondary | ICD-10-CM

## 2019-08-27 DIAGNOSIS — G4733 Obstructive sleep apnea (adult) (pediatric): Secondary | ICD-10-CM

## 2019-08-27 DIAGNOSIS — I2510 Atherosclerotic heart disease of native coronary artery without angina pectoris: Secondary | ICD-10-CM

## 2019-08-27 DIAGNOSIS — M1990 Unspecified osteoarthritis, unspecified site: Secondary | ICD-10-CM

## 2019-08-27 DIAGNOSIS — E538 Deficiency of other specified B group vitamins: Secondary | ICD-10-CM

## 2019-08-27 NOTE — Patient Instructions (Signed)
August 27, 2019  Dear Carl Lee,  It was a pleasure meeting you during our initial appointment on August 27, 2019. Below is a summary of the goals we discussed and components of chronic care management. Please contact me anytime with questions or concerns.   Visit Information  Goals Addressed            This Visit's Progress   . Pharmacy Care Plan       Current Barriers:  . Chronic Disease Management support, education, and care coordination needs related to hypertension, COPD, hyperlipidemia, diabetes, osteoarthritis, urinary frequency, GERD  Pharmacist Clinical Goal(s):  . Ensure accurate medication list. Will add amlodipine to medication list. . Ensure safety of medications. No medications identified as likely cause of dry mouth or skin rash.  . Reduce urinary frequency. Try taking tamsulosin 2 capsules after breakfast to improve absorption and daytime symptoms.  Chesley Noon redness/sore throat. Refer patient to PCP for evaluation. Will let patient know if he needs to come in for an evaluation.  Interventions: . Comprehensive medication review performed.  Patient Self Care Activities:  . Uses pill box; Takes medications as prescribed . Checks blood pressure daily  Initial goal documentation        Carl Lee was given information about Chronic Care Management services today including:  1. CCM service includes personalized support from designated clinical staff supervised by his physician, including individualized plan of care and coordination with other care providers 2. 24/7 contact phone numbers for assistance for urgent and routine care needs. 3. Service will only be billed when office clinical staff spend 20 minutes or more in a month to coordinate care. 4. Only one practitioner may furnish and bill the service in a calendar month. 5. The patient may stop CCM services at any time (effective at the end of the month) by phone call to the office staff. 6. The  patient will be responsible for cost sharing (co-pay) of up to 20% of the service fee (after annual deductible is met).  Patient agreed to services and verbal consent obtained.   Telephone follow up appointment with pharmacy team member scheduled for: February 11, 2020 8:30 AM telephone  Debbora Dus, PharmD Clinical Pharmacist [facility] [phone]   [place] stands for "Dietary Approaches to Stop Hypertension." The DASH eating plan is a healthy eating plan that has been shown to reduce high blood pressure (hypertension). It may also reduce your risk for type 2 diabetes, heart disease, and stroke. The DASH eating plan may also help with weight loss. What are tips for following this plan?  General guidelines  Avoid eating more than 2,300 mg (milligrams) of salt (sodium) a day. If you have hypertension, you may need to reduce your sodium intake to 1,500 mg a day.  Limit alcohol intake to no more than 1 drink a day for nonpregnant women and 2 drinks a day for men. One drink equals 12 oz of beer, 5 oz of wine, or 1 oz of hard liquor.  Work with your health care provider to maintain a healthy body weight or to lose weight. Ask what an ideal weight is for you.  Get at least 30 minutes of exercise that causes your heart to beat faster (aerobic exercise) most days of the week. Activities may include walking, swimming, or biking.  Work with your health care provider or diet and nutrition specialist (dietitian) to adjust your eating plan to your individual calorie needs.  Reading food labels   Check food labels for the amount of sodium per serving. Choose foods with less than 5 percent of the Daily Value of sodium. Generally, foods with less than 300 mg of sodium per serving fit into this eating plan.  To find whole grains, look for the word "whole" as the first word in the ingredient list. Shopping  Buy products labeled as "low-sodium" or "no salt  added."  Buy fresh foods. Avoid canned foods and premade or frozen meals. Cooking  Avoid adding salt when cooking. Use salt-free seasonings or herbs instead of table salt or sea salt. Check with your health care provider or pharmacist before using salt substitutes.  Do not fry foods. Cook foods using healthy methods such as baking, boiling, grilling, and broiling instead.  Cook with heart-healthy oils, such as olive, canola, soybean, or sunflower oil. Meal planning  Eat a balanced diet that includes: ? 5 or more servings of fruits and vegetables each day. At each meal, try to fill half of your plate with fruits and vegetables. ? Up to 6-8 servings of whole grains each day. ? Less than 6 oz of lean meat, poultry, or fish each day. A 3-oz serving of meat is about the same size as a deck of cards. One egg equals 1 oz. ? 2 servings of low-fat dairy each day. ? A serving of nuts, seeds, or beans 5 times each week. ? Heart-healthy fats. Healthy fats called Omega-3 fatty acids are found in foods such as flaxseeds and coldwater fish, like sardines, salmon, and mackerel.  Limit how much you eat of the following: ? Canned or prepackaged foods. ? Food that is high in trans fat, such as fried foods. ? Food that is high in saturated fat, such as fatty meat. ? Sweets, desserts, sugary drinks, and other foods with added sugar. ? Full-fat dairy products.  Do not salt foods before eating.  Try to eat at least 2 vegetarian meals each week.  Eat more home-cooked food and less restaurant, buffet, and fast food.  When eating at a restaurant, ask that your food be prepared with less salt or no salt, if possible. What foods are recommended? The items listed may not be a complete list. Talk with your dietitian about what dietary choices are best for you. Grains Whole-grain or whole-wheat bread. Whole-grain or whole-wheat pasta. Brown rice. Modena Morrow. Bulgur. Whole-grain and low-sodium cereals.  Pita bread. Low-fat, low-sodium crackers. Whole-wheat flour tortillas. Vegetables Fresh or frozen vegetables (raw, steamed, roasted, or grilled). Low-sodium or reduced-sodium tomato and vegetable juice. Low-sodium or reduced-sodium tomato sauce and tomato paste. Low-sodium or reduced-sodium canned vegetables. Fruits All fresh, dried, or frozen fruit. Canned fruit in natural juice (without added sugar). Meat and other protein foods Skinless chicken or [place]. Ground chicken or [place]. Pork with fat trimmed off. Fish and seafood. Egg whites. Dried beans, peas, or lentils. Unsalted nuts, nut butters, and seeds. Unsalted canned beans. Lean cuts of beef with fat trimmed off. Low-sodium, lean deli meat. Dairy Low-fat (1%) or fat-free (skim) milk. Fat-free, low-fat, or reduced-fat cheeses. Nonfat, low-sodium ricotta or cottage cheese. Low-fat or nonfat yogurt. Low-fat, low-sodium cheese. Fats and oils Soft margarine without trans fats. Vegetable oil. Low-fat, reduced-fat, or light mayonnaise and salad dressings (reduced-sodium). Canola, safflower, olive, soybean, and sunflower oils. Avocado. Seasoning and other foods Herbs. Spices. Seasoning mixes without salt. Unsalted popcorn and pretzels. Fat-free sweets. What foods are not recommended? The items listed may not be a complete list.  Talk with your dietitian about what dietary choices are best for you. Grains Baked goods made with fat, such as croissants, muffins, or some breads. Dry pasta or rice meal packs. Vegetables Creamed or fried vegetables. Vegetables in a cheese sauce. Regular canned vegetables (not low-sodium or reduced-sodium). Regular canned tomato sauce and paste (not low-sodium or reduced-sodium). Regular tomato and vegetable juice (not low-sodium or reduced-sodium). Angie Fava. Olives. Fruits Canned fruit in a light or heavy syrup. Fried fruit. Fruit in cream or butter sauce. Meat and other protein foods Fatty cuts of meat. Ribs. Fried  meat. Berniece Salines. Sausage. Bologna and other processed lunch meats. Salami. Fatback. Hotdogs. Bratwurst. Salted nuts and seeds. Canned beans with added salt. Canned or smoked fish. Whole eggs or egg yolks. Chicken or [place] with skin. Dairy Whole or 2% milk, cream, and half-and-half. Whole or full-fat cream cheese. Whole-fat or sweetened yogurt. Full-fat cheese. Nondairy creamers. Whipped toppings. Processed cheese and cheese spreads. Fats and oils Butter. Stick margarine. Lard. Shortening. Ghee. Bacon fat. Tropical oils, such as coconut, palm kernel, or palm oil. Seasoning and other foods Salted popcorn and pretzels. Onion salt, garlic salt, seasoned salt, table salt, and sea salt. Worcestershire sauce. Tartar sauce. Barbecue sauce. Teriyaki sauce. Soy sauce, including reduced-sodium. Steak sauce. Canned and packaged gravies. Fish sauce. Oyster sauce. Cocktail sauce. Horseradish that you find on the shelf. Ketchup. Mustard. Meat flavorings and tenderizers. Bouillon cubes. Hot sauce and Tabasco sauce. Premade or packaged marinades. Premade or packaged taco seasonings. Relishes. Regular salad dressings. Where to find more information:  National Heart, Lung, and [place]: https://wilson-eaton.com/  American Heart Association: www.heart.org Summary  The DASH eating plan is a healthy eating plan that has been shown to reduce high blood pressure (hypertension). It may also reduce your risk for type 2 diabetes, heart disease, and stroke.  With the DASH eating plan, you should limit salt (sodium) intake to 2,300 mg a day. If you have hypertension, you may need to reduce your sodium intake to 1,500 mg a day.  When on the DASH eating plan, aim to eat more fresh fruits and vegetables, whole grains, lean proteins, low-fat dairy, and heart-healthy fats.  Work with your health care provider or diet and nutrition specialist (dietitian) to adjust your eating plan to your individual calorie needs. This information is  not intended to replace advice given to you by your health care provider. Make sure you discuss any questions you have with your health care provider. Document Revised: 06/10/2017 Document Reviewed: 06/21/2016 Elsevier Patient Education  2020 [employer].

## 2019-08-27 NOTE — Chronic Care Management (AMB) (Signed)
Chronic Care Management Pharmacy  Name: Carl Lee  MRN: [id number] DOB: 06-29-1956  Chief Complaint/ HPI  [name],  64 y.o., male presents for their Initial CCM visit with the clinical pharmacist via telephone.  PCP : [name], MD  Specialists:  Loanne Drilling, Pulmonology  Wilburn Cornelia, ENT  Branch, GI  Their chronic conditions include: HTN, HLD, CAD, COPD, OA, OAS, GERD, DM  Patient concerns: Would rather not take cholesterol medication - reviewed recommended duration of therapy, benefits/risks of discontinuation; any meds that could contribute to dry mouth? Potentially opioids (but not taking frequently); rash on ankles, sides, shoulders - started coming up a couple months ago (h/o of contact dermatitis), could this be medication related? Unlikely as no med changes in last 3 months except increase B12 to daily   Office Visits:  07/23/19: Danise Mina, AWV - increased B12 to daily  Consult Visit:  08/07/19: Warner Mccreedy Brain, pulmonology - patient did not meet criteria for supplemental oxygen, continue Anoro, offered PPSV23, maintain oxygen sats at 88%  08/01/19: Chi Rodman Pickle, pulmonology - ordered 6 min walk test, continue current meds  07/11/19: GI - doing well post procedure for Zenker's, much less coughing, throat red/sore  Allergies  Allergen Reactions  . Codeine Other (See Comments)    Nightmares  . Doxycycline Hives and Other (See Comments)    mild   Medications: Outpatient Encounter Medications as of 08/27/2019  Medication Sig  . acetaminophen (TYLENOL) 325 MG tablet Take 2 tablets (650 mg total) by mouth every 6 (six) hours as needed for moderate pain or headache.  . albuterol (PROAIR HFA) 108 (90 Base) MCG/ACT inhaler Inhale 2 puffs into the lungs every 4 (four) hours as needed for wheezing or shortness of breath.  [name] aspirin EC 81 MG tablet Take 1 tablet (81 mg total) by mouth daily.  [name] atorvastatin (LIPITOR) 20 MG tablet TAKE 1 TABLET (20 MG TOTAL) BY MOUTH  DAILY AT 6 PM.  . cholecalciferol (VITAMIN D3) 25 MCG (1000 UT) tablet Take 1,000 Units by mouth daily.  . DPH-Lido-AlHydr-MgHydr-Simeth (FIRST-MOUTHWASH BLM) SUSP Use as directed 5 mLs in the mouth or throat 2 (two) times daily as needed.  . famotidine (PEPCID) 20 MG tablet Take 1 tablet (20 mg total) by mouth 2 (two) times daily.  [name] glucose blood (ONE TOUCH ULTRA TEST) test strip CHECK ONCE DAILY AND AS NEEDED  . ipratropium-albuterol (DUONEB) 0.5-2.5 (3) MG/3ML SOLN Take 3 mLs by nebulization every 6 (six) hours as needed.  [name] ketotifen (ZADITOR) 0.025 % ophthalmic solution Place 1 drop into both eyes daily as needed (allergies).  . metoprolol tartrate (LOPRESSOR) 25 MG tablet TAKE 0.5 TABLETS (12.5 MG TOTAL) BY MOUTH 2 (TWO) TIMES DAILY.  [name] oxyCODONE (OXY IR/ROXICODONE) 5 MG immediate release tablet Take 5 mg by mouth every 4 (four) hours as needed.  . pantoprazole (PROTONIX) 40 MG tablet Take 1 tablet (40 mg total) by mouth daily.  . tamsulosin (FLOMAX) 0.4 MG CAPS capsule Take 1 capsule (0.4 mg total) by mouth daily after supper.  . umeclidinium-vilanterol (ANORO ELLIPTA) 62.5-25 MCG/INH AEPB Inhale 1 puff into the lungs daily.  . vitamin B-12 (CYANOCOBALAMIN) 500 MCG tablet Take 1 tablet (500 mcg total) by mouth daily.   No facility-administered encounter medications on file as of 08/27/2019.   Current Diagnosis/Assessment:  Goals    . Increase physical activity     Starting 07/13/18, I will continue to walk 10 minutes daily.      [name] Pharmacy Care Plan  Current Barriers:  . Chronic Disease Management support, education, and care coordination needs related to hypertension, COPD, hyperlipidemia, diabetes, osteoarthritis, urinary frequency, GERD  Pharmacist Clinical Goal(s):  . Ensure accurate medication list. Will add amlodipine to medication list. . Ensure safety of medications. No medications identified as likely cause of dry mouth or skin rash.  . Reduce urinary frequency. Try  taking tamsulosin 2 capsules after breakfast to improve absorption and daytime symptoms.  Chesley Noon redness/sore throat. Refer patient to PCP for evaluation. Will let patient know if he needs to come in for an evaluation.  Interventions: . Comprehensive medication review performed.  Patient Self Care Activities:  . Uses pill box; Takes medications as prescribed . Checks blood pressure daily  Initial goal documentation       COPD / Tobacco   Last spirometry score: 11/29/17 FEV1 (pre) 49% Gold Grade: Gold 3 (FEV1 30-49%) Current COPD Classification:  B (high sx, <2 exacerbations/yr) Reports pneumonia 2-3 times in last couple years, denies hospitalizations  Eosinophil count:   Lab Results  Component Value Date/Time   EOSPCT 5.6 (H) 07/17/2019 08:29 AM  %                               Eos (Absolute):  Lab Results  Component Value Date/Time   EOSABS 0.5 07/17/2019 08:29 AM   EOSABS 0.1 01/31/2019 09:40 AM   Tobacco Status:  Social History   Tobacco Use  Smoking Status Former Smoker  . Packs/day: 1.00  . Years: 44.00  . Pack years: 44.00  . Types: Cigarettes  . Start date: 52  . Quit date: 02/05/2014  . Years since quitting: 5.5  Smokeless Tobacco Never Used   BIPAP use: cleans weekly, noticed some dry mouth, turns up humidifier full blast  Patient has failed these meds in past: Trelegy (thrush), Breo (thrush)  Patient is currently controlled on the following medications:   Albuterol - 2 puffs every 4 hours as needed  Duoneb 3mg /81mL - take 3 mLs every 6 hours as needed  Anoro Ellipta 62.5-25 mcg - Inhale 1 puff daily Using maintenance inhaler regularly? Yes Frequency of rescue inhaler use:  Albuterol 1-2x weekly; hasn't used nebulizer in > 1 month; frequency of use depends on exercise  We discussed:  proper inhaler technique; contact pulmonology if O2 < 88%, checks oxygen at home, normally ranges 89-98%   Plan: Continue current medications; Reviewed medications  for side effects of dry mouth, none significant. ,  Diabetes   Recent Relevant Labs: Lab Results  Component Value Date/Time   HGBA1C 6.3 07/17/2019 08:29 AM   HGBA1C 6.1 (A) 03/22/2019 09:13 AM   HGBA1C 6.5 (A) 11/08/2018 10:10 AM   HGBA1C 6.5 07/13/2018 08:51 AM   MICROALBUR 1.5 07/17/2019 08:29 AM   MICROALBUR <0.7 07/13/2018 09:16 AM    Checking BG: none Patient has failed these meds in past: none Patient is currently controlled on the following medications:   No pharmacotherapy  Last diabetic eye exam:  Lab Results  Component Value Date/Time   HMDIABEYEEXA No Retinopathy 03/23/2018 12:00 AM    Last diabetic foot exam: No results found for: HMDIABFOOTEX  Exercise: not regularly, hip replacement (bursitits) lots of pain, limits exericse Diet: no particular diet, eats a lot of vegetables, limits red meat, salads, cookies; generally watches portions We discussed: diet and exercise extensively  Plan: Continue control with diet and exercise   Hypertension   Office blood pressures  are  BP Readings from Last 3 Encounters:  08/07/19 124/64  08/01/19 122/70  07/23/19 114/66   CMP Latest Ref Rng & Units 07/17/2019 05/01/2019 04/21/2019  Glucose 70 - 99 mg/dL 113(H) 108(H) 138(H)  BUN 6 - 23 mg/dL 13 16 11   Creatinine 0.40 - 1.50 mg/dL 0.74 0.69 0.71  Sodium 135 - 145 mEq/L 139 139 136  Potassium 3.5 - 5.1 mEq/L 4.4 4.6 3.7  Chloride 96 - 112 mEq/L 102 102 96(L)  CO2 19 - 32 mEq/L 28 30 28   Calcium 8.4 - 10.5 mg/dL 9.4 9.5 8.7(L)  Total Protein 6.0 - 8.3 g/dL 6.2 6.5 6.1(L)  Total Bilirubin 0.2 - 1.2 mg/dL 1.1 1.1 0.9  Alkaline Phos 39 - 117 U/L 92 113 189(H)  AST 0 - 37 U/L 13 14 80(H)  ALT 0 - 53 U/L 8 10 73(H)   Goal: < 140/90 mmHg Patient has tried these meds in the past: lisinopril, nebivolol  Patient checks BP at home daily Patient home BP readings are ranging:134/83 mmHg, 137/89 mmHg, 110/70 mmHg, 143/86 mmHg, 118/77 mmHg, 129/80 mmHg, 110/60 mmHg, 108/60  mmHg  Patient is currently controlled on the following medications:   Amlodipine 5 mg - 1 tablet daily  *not on active med list*  Metoprolol tartrate 25 mg - one-half tablet twice daily  We discussed: diet and exercise; history of hypotension,used to fluctuate a lot; past due for cardiology visit   Plan: Continue current medications; Amlodipine was removed from medication list, but per patient and chart review, patient should continue. Renewal needed.  Hyperlipidemia/CAD   CBC Latest Ref Rng & Units 07/17/2019 05/01/2019 04/21/2019  WBC 4.0 - 10.5 K/uL 9.3 10.3 10.6(H)  Hemoglobin 13.0 - 17.0 g/dL 16.7 15.7 14.4  Hematocrit 39.0 - 52.0 % 49.3 47.2 41.5  Platelets 150.0 - 400.0 K/uL 237.0 439.0(H) 256   Lipid Panel     Component Value Date/Time   CHOL 97 07/17/2019 0829   CHOL 170 01/05/2012 0000   TRIG 117.0 07/17/2019 0829   TRIG 141 01/05/2012 0000   HDL 37.50 (L) 07/17/2019 0829   CHOLHDL 3 07/17/2019 0829   VLDL 23.4 07/17/2019 0829   LDLCALC 36 07/17/2019 0829   LDLDIRECT 62.0 12/28/2017 0827    ASCVD: CABG x 3, CAD Patient has failed these meds in past: none Patient is currently controlled on the following medications:   Aspirin 81 mg - once daily  Atorvastatin 20 mg - once daily  We discussed: avoids NSAIDs, encouraged continued statin use for CV protection   Plan: Continue current medications  Osteoarthritis/Chronic pain   Patient has failed these meds in past: none Patient is currently controlled on the following medications:   Oxycodone 5 mg IR - 1 tablet every 4 hours as needed  OTCs: Tylenol 325 mg  We discussed: hasn't taken oxycodone in months, only uses occasionally for back pain  Plan: Continue current medications  GERD   Patient has failed these meds in past: omeprazole (changed Oct 2020 during hospital stay) Patient is currently controlled on the following medications:   Famotidine 20 mg - one tablet twice daily   Pantoprazole 40 mg - 1  tablet daily We discussed: Pt stopped taking pantoprazole 3 weeks ago after reading online, switched to every other day for about a week, then stopped altogether. Acid reflux resumed so patient restarted  pantoprazole. Discussed risks versus benefits of long term therapy, patient will continue current therapy.   Plan: Continue current medications  Recent complaint: Red/sore  throat   Patient has tried these meds in past: First Mouthwash BLM susp - finished bottle, helped with soreness, but not healing Patient is currently uncontrolled on the following medications:  No pharmacotherapy  Plan: Will defer to PCP for additional recommendations/evaluate if patient needs to be seen in office.  Urinary Frequency   Frequency: 10 or more times daily, only once at night  Patient has failed these meds in past: none Patient is currently uncontrolled on the following medications:   Tamsulosin 0.4 mg cap - 2 caps at bedtime We discussed:  Patient reports tamsulosin has improved urinary symptoms, but still urinating a lot and interested in alternatives; discussed alternatives and patient would like to stay with current medication due to less side effects. Recommend taking with a meal for absorption.  Plan: Continue current medications. May switch dosing from bedtime to with breakfast for daytime symptom relief.  Medication Management  OTCs: vitamin D 1000 IU daily, vitamin B12 500 mcg daily - recently increased to daily, Ketotifen (Zaditor) ophthalmic solution - 1 drop both eyes PRN dry eyes   [facility] --> declined [facility] at this time, but interested   Adherence: pill box  Social support: wife  Affordability: Anoro (very expensive), goes into donut hole in July/August $580/month, does not meet PAP requirements, sometimes only takes every other day at the end of year --> ask pulmonary for samples, pt does not want to change Anoro due to history of thrush   CCM Follow Up:  August 2,  8:30 AM telephone   Debbora Dus, PharmD Clinical Pharmacist [facility] at [facility] [phone]

## 2019-08-28 ENCOUNTER — Telehealth: Payer: Self-pay

## 2019-08-28 DIAGNOSIS — I10 Essential (primary) hypertension: Secondary | ICD-10-CM

## 2019-08-28 DIAGNOSIS — K219 Gastro-esophageal reflux disease without esophagitis: Secondary | ICD-10-CM

## 2019-08-28 NOTE — Telephone Encounter (Signed)
I would like to request a referral for Carl Lee to chronic care management pharmacy services focusing on the following conditions:   Essential hypertension, benign  [I10]  GERD [K21.9]   Debbora Dus, PharmD Clinical Pharmacist [facility] at [facility] [phone]

## 2019-08-31 ENCOUNTER — Ambulatory Visit: Payer: PPO | Admitting: Cardiovascular Disease

## 2019-10-16 ENCOUNTER — Telehealth: Payer: Self-pay | Admitting: Physical Medicine and Rehabilitation

## 2019-10-16 NOTE — Telephone Encounter (Signed)
Ok for OV plust trochanter then 2 weeks after that plan on facets but might change with OV - has [employer] so should not be issue

## 2019-10-16 NOTE — Telephone Encounter (Signed)
Patient left a message requesting injections for his hip and his back. Left greater trochanteric bursa injection on 01/12/2019 and right L4-5, L5-S1 facets on 02/08/2019. Please advise.

## 2019-10-17 NOTE — Telephone Encounter (Signed)
Scheduled for 4/20 and 5/4. Patient will have driver for both appointments.

## 2019-10-17 NOTE — Telephone Encounter (Signed)
Left message #1

## 2019-10-22 ENCOUNTER — Other Ambulatory Visit: Payer: Self-pay | Admitting: Family Medicine

## 2019-10-22 ENCOUNTER — Other Ambulatory Visit: Payer: Self-pay | Admitting: Cardiovascular Disease

## 2019-10-29 ENCOUNTER — Telehealth: Payer: Self-pay | Admitting: Cardiovascular Disease

## 2019-10-29 ENCOUNTER — Other Ambulatory Visit: Payer: Self-pay | Admitting: Pulmonary Disease

## 2019-10-29 NOTE — Telephone Encounter (Signed)
 *  STAT* If patient is at the pharmacy, call can be transferred to refill team.   1. Which medications need to be refilled? (please list name of each medication and dose if known) amlodipine besylate 5 mg  2. Which pharmacy/location (including street and city if local pharmacy) is medication to be sent to? [id number] [name], [place]  3. Do they need a 30 day or 90 day supply? 90 days  Pt said he's been taking this 1 tablet a day and prescribed by Dr. [name]

## 2019-10-29 NOTE — Telephone Encounter (Signed)
Called pt to inform him that his Pulmonary doctor D/C his medication Amlodipine and that he needed to call Dr. Cordelia Pen office to let them know so they can prescribe this medication again and if he needed to give our office a call back, to do so. Pt verbalized understanding.

## 2019-10-29 NOTE — Telephone Encounter (Signed)
ATC, NA 

## 2019-10-30 ENCOUNTER — Other Ambulatory Visit: Payer: Self-pay

## 2019-10-30 ENCOUNTER — Encounter: Payer: Self-pay | Admitting: Physical Medicine and Rehabilitation

## 2019-10-30 ENCOUNTER — Ambulatory Visit: Payer: PPO | Admitting: Physical Medicine and Rehabilitation

## 2019-10-30 ENCOUNTER — Ambulatory Visit: Payer: Self-pay

## 2019-10-30 VITALS — BP 115/73 | HR 70 | Ht 69.0 in | Wt 200.0 lb

## 2019-10-30 DIAGNOSIS — M47816 Spondylosis without myelopathy or radiculopathy, lumbar region: Secondary | ICD-10-CM

## 2019-10-30 DIAGNOSIS — M7062 Trochanteric bursitis, left hip: Secondary | ICD-10-CM

## 2019-10-30 DIAGNOSIS — G8929 Other chronic pain: Secondary | ICD-10-CM

## 2019-10-30 DIAGNOSIS — Z96642 Presence of left artificial hip joint: Secondary | ICD-10-CM | POA: Diagnosis not present

## 2019-10-30 DIAGNOSIS — M545 Low back pain, unspecified: Secondary | ICD-10-CM

## 2019-10-30 NOTE — Progress Notes (Signed)
.  Numeric Pain Rating Scale and Functional Assessment Average Pain 7 Pain Right Now 8 My pain is constant, sharp and dull Pain is worse with: walking and sitting Pain improves with: rest   In the last MONTH (on 0-10 scale) has pain interfered with the following?  1. General activity like being  able to carry out your everyday physical activities such as walking, climbing stairs, carrying groceries, or moving a chair?  Rating(9)  2. Relation with others like being able to carry out your usual social activities and roles such as  activities at home, at work and in your community. Rating(9)  3. Enjoyment of life such that you have  been bothered by emotional problems such as feeling anxious, depressed or irritable?  Rating(6)

## 2019-10-30 NOTE — Telephone Encounter (Signed)
Spoke with the pt's spouse. He states that Dr Kyla Balzarine office denied his amlodipine due to [place] taking this off of his med list.  I advised that if he told us that he was not taking this then we would have removed this from the list.  She states that he never stopped taking. Med was taken off of his list by mistake. I called Dr Kyla Balzarine office to discuss this with his nurse and see if they can go ahead and fill this for him if appropriate- was on hold x 15 min.  Will forward this encounter to Dr [name] for advise, thanks!

## 2019-10-30 NOTE — Telephone Encounter (Signed)
He has history of HTN I can't find norvasc in past med list we can call in previous dose to pharmacy ? 5 mg daily

## 2019-10-31 ENCOUNTER — Encounter: Payer: Self-pay | Admitting: Physical Medicine and Rehabilitation

## 2019-10-31 MED ORDER — AMLODIPINE BESYLATE 5 MG PO TABS: 5.0000 mg | ORAL_TABLET | Freq: Every day | ORAL | 3 refills | Status: DC

## 2019-10-31 MED ORDER — TRIAMCINOLONE ACETONIDE 40 MG/ML IJ SUSP
60.0000 mg | INTRAMUSCULAR | Status: AC | PRN
  Administered 2019-10-30: 09:00:00 60 mg via INTRA_ARTICULAR

## 2019-10-31 MED ORDER — BUPIVACAINE HCL 0.25 % IJ SOLN
4.0000 mL | INTRAMUSCULAR | Status: AC | PRN
  Administered 2019-10-30: 09:00:00 4 mL via INTRA_ARTICULAR

## 2019-10-31 MED ORDER — LIDOCAINE HCL 2 % IJ SOLN
4.0000 mL | INTRAMUSCULAR | Status: AC | PRN
  Administered 2019-10-30: 09:00:00 4 mL

## 2019-10-31 NOTE — Progress Notes (Signed)
Carl Lee - 64 y.o. male MRN [id number]  Date of birth: February 15, 1956  Office Visit Note: Visit Date: 10/30/2019 PCP: [name], MD Referred by: [name], MD  Subjective: Chief Complaint  Patient presents with  . Lower Back - Pain  . Left Hip - Pain   HPI: Carl Lee is a 64 y.o. male who comes in today For eval ration management of chronic worsening low back pain and chronic worsening severe left lateral hip pain.  He really does have 2 distinct issues and his notes through Dr. [name] myself can be reviewed in in its entirety.  Brief review is that he first saw me after having been treated by Dr. [name] for some time from a pain standpoint with the spine.  She had completed PRP injections and epidural injections etc.  When I first saw him he was having left hip and groin pain and we determined he had pretty good bit of arthritis in the left hip and he ultimately went on to have left hip replacement by Dr. [name].  This was successful for a lot of his hip and groin pain but he has had this lateral hip pain ever since the surgery.  No numbness or paresthesia some referral into the thigh.  He had several injections over the greater trochanter by Dr. [name] and his assistant [name], PA-C.  Those were somewhat effective but temporary.  I have completed greater trochanteric injection using fluoroscopic guidance and those have been a little bit better in terms of success.  They still do not give him complete relief for a long time as he gets really good relief for a few days to 2 weeks and then gradual return of symptoms.  He has no pain or paresthesia past the knees.  In terms of his back pain he has mostly axial back pain across the lower back worse with standing for any length of time he really just cannot stand for very long without trying to find a place to sit down.  Again similar resolution of symptoms daily by taking a half of a pain pill and using lidocaine patch  and increasing activity but by the end of the day he really cannot do much after that.  Again no radicular complaints no radicular pain no focal weakness.  He has had physical therapy without relief he does do home exercises.  We have completed 1 set of diagnostic medial branch blocks and those are documented with good success with more than 80% relief of his back pain temporarily.  Patient's case is complicated by history of type 2 diabetes long-term smoker but has quit over the last 5 years and he has had multiple stents and coronary artery disease and peripheral vascular disease.  Review of Systems  Musculoskeletal: Positive for back pain and joint pain.  All other systems reviewed and are negative.  Otherwise per HPI.  Assessment & Plan: Visit Diagnoses:  1. Greater trochanteric bursitis, left   2. Chronic bilateral low back pain without sciatica   3. Spondylosis without myelopathy or radiculopathy, lumbar region   4. History of total hip arthroplasty, left     Plan: Findings:  1.  Chronic worsening severe left lateral hip pain.  Symptoms and clinical history still consistent with a greater trochanteric pain syndrome or tendinopathy tendinitis type issue.  He has had MRI of the hip since have seen him last that did not show any tendon tearing edema or any other issues with his  artificial hip.  He has had good relief with greater trochanteric injection which seems the last for a couple of weeks almost 100% relief and then slowly returned to still gives him some relief just not great.  He tends to take a small amount of pain medicine in the morning and does pretty well up until the afternoon and then he is really hurting at that point with weightbearing and walking.  We will try greater trochanteric injection again today last injection was several months ago.  Alternatives would be prolotherapy Dr. Junius Roads and continue to follow for his hip with Dr. [name].  He continues to use some Voltaren gel and  he does state that it helps.  He only really uses it once a day.  We encouraged him to use that 3 times a day for a few weeks and see what happens.  2.  Chronic history of low back pain which is axial and worse with standing and ambulating.  No radicular complaints per se.  History of small disc protrusion L5-S1 mostly facet arthropathy.  Prior medial branch blocks were very successful with pain diary showing pain relief of his low back.  He has degenerative spine throughout.  We did complete second set of diagnostic blocks and then look at radiofrequency ablation for more definitive treatment.  He has had physical therapy and other management for quite a long while.  This is a real chronic situation for him that is limiting what he can do.    Meds & Orders: No orders of the defined types were placed in this encounter.   Orders Placed This Encounter  Procedures  . Large Joint Inj: L greater trochanter  . XR C-ARM NO REPORT    Follow-up: Return for Second set of diagnostic medial branch blocks.   Procedures: Large Joint Inj: L greater trochanter on 10/30/2019 9:00 AM Indications: pain and diagnostic evaluation Details: 22 G 3.5 in needle, fluoroscopy-guided lateral approach  Arthrogram: No  Medications: 4 mL lidocaine 2 %; 4 mL bupivacaine 0.25 %; 60 mg triamcinolone acetonide 40 MG/ML Outcome: tolerated well, no immediate complications  There was excellent flow of contrast outlined the greater trochanteric bursa without vascular uptake. Procedure, treatment alternatives, risks and benefits explained, specific risks discussed. Consent was given by the patient. Immediately prior to procedure a time out was called to verify the correct patient, procedure, equipment, support staff and site/side marked as required. Patient was prepped and draped in the usual sterile fashion.      No notes on file   Clinical History: MRI LUMBAR SPINE WITHOUT CONTRAST  TECHNIQUE: Multiplanar,  multisequence MR imaging of the lumbar spine was performed. No intravenous contrast was administered. Metal artifact reduction techniques were utilized.  COMPARISON:  11/27/2015  FINDINGS: Severe metallic susceptibility artifact resulting from aortoiliac stent graft which partially obscures portions of the lumbar spine.  Segmentation:  Standard.  Alignment:  Slight lumbar dextrocurvature.  No static listhesis.  Vertebrae: The L2 through L5 vertebral bodies are largely obscured by susceptibility artifact. Within those limitations. No fracture, bone lesion, or evidence of discitis identified.  Conus medullaris and cauda equina: Conus extends to the L2 level. Conus and cauda equina appear normal.  Paraspinal and other soft tissues: Aortoiliac stent graft.  Disc levels:  T12-L1: No significant disc protrusion, foraminal stenosis, or canal stenosis.  L1-L2: Minimal disc bulge.  No foraminal or canal stenosis.  L2-L3: Evaluation of the canal on axial images obscured by susceptibility artifact. On the sagittal sequences. There  is a focal right paracentral disc protrusion, enlarged from prior. At least mild-to-moderate canal stenosis. No significant foraminal stenosis evident.  L3-L4: Diffuse disc bulge and mild bilateral facet arthrosis results in mild canal stenosis without foraminal stenosis. Findings appears similar to prior.  L4-L5: Diffuse disc bulge and moderate bilateral facet arthrosis resulting in mild-to-moderate canal stenosis and mild right foraminal stenosis. Findings slightly progressed from prior.  L5-S1: Diffuse disc bulge with left paracentral protrusion and mild bilateral facet arthrosis. There is left subarticular recess stenosis. No significant foraminal or canal stenosis. Findings similar to prior.  IMPRESSION: 1. Examination is degraded by extensive metallic susceptibility artifact related to aortoiliac stent graft. 2. Worsened right  paracentral disc protrusion at L2-3 which appears to cause at least mild-to-moderate canal stenosis, suboptimally evaluated. 3. Slight progression of degenerative disc disease at L4-5 where there is mild-to-moderate canal stenosis and mild right foraminal stenosis.   Electronically Signed   By: Davina Poke M.D.   On: 03/31/2019 17:17   He reports that he quit smoking about 5 years ago. His smoking use included cigarettes. He started smoking about 50 years ago. He has a 44.00 pack-year smoking history. He has never used smokeless tobacco.  Recent Labs    11/08/18 1010 03/22/19 0913 07/17/19 0829  HGBA1C 6.5* 6.1* 6.3    Objective:  VS:  HT:5\' 9"  (175.3 cm)   WT:200 lb (90.7 kg)  BMI:29.52    BP:115/73  HR:70bpm  TEMP: ( )  RESP:  Physical Exam Vitals and nursing note reviewed.  Constitutional:      General: He is not in acute distress.    Appearance: Normal appearance. He is well-developed.  HENT:     Head: Normocephalic and atraumatic.  Eyes:     Conjunctiva/sclera: Conjunctivae normal.     Pupils: Pupils are equal, round, and reactive to light.  Cardiovascular:     Rate and Rhythm: Normal rate.     Pulses: Normal pulses.     Heart sounds: Normal heart sounds.  Pulmonary:     Effort: Pulmonary effort is normal. No respiratory distress.  Musculoskeletal:     Cervical back: Normal range of motion and neck supple. No rigidity.     Right lower leg: No edema.     Left lower leg: No edema.     Comments: Patient stands forward flexed he does have pain with extension and facet loading of the lower back.  He has no real focal trigger points.  He has no real pain with hip rotation with internal rotation.  Some pain with external rotation on the left.  Exquisite pain over the left greater trochanter.  He has good distal strength and a negative slump test bilaterally.  Skin:    General: Skin is warm and dry.     Findings: No erythema or rash.  Neurological:      General: No focal deficit present.     Mental Status: He is alert and oriented to person, place, and time.     Sensory: No sensory deficit.     Coordination: Coordination normal.     Gait: Gait normal.  Psychiatric:        Mood and Affect: Mood normal.        Behavior: Behavior normal.     Ortho Exam  Imaging: XR C-ARM NO REPORT  Result Date: 10/30/2019 Please see Notes tab for imaging impression.   Past Medical/Family/Surgical/Social History: Medications & Allergies reviewed per EMR, new medications updated. Patient Active Problem List  Diagnosis Date Noted  . Hypoxemia 08/07/2019  . Medicare annual wellness visit, subsequent 07/23/2019  . Health maintenance examination 07/23/2019  . Duodenal diverticulum 05/17/2019  . Common bile duct stone   . Hyperbilirubinemia 04/17/2019  . Choledocholithiasis with acute cholecystitis 04/17/2019  . Preoperative clearance 01/23/2019  . Gallstones 11/14/2018  . Lactose intolerance 11/14/2018  . Cough 10/10/2018  . Aortic atherosclerosis ([place]) 08/05/2018  . Personal history of tobacco use, presenting hazards to health 07/18/2018  . GERD (gastroesophageal reflux disease) 07/16/2018  . Advanced care planning/counseling discussion 07/14/2018  . Hives 07/14/2018  . Cataracts, bilateral 05/09/2018  . Macular degeneration, dry 05/09/2018  . Hearing loss 12/29/2017  . Chronic foot pain 12/29/2017  . Diverticulosis of colon without hemorrhage   . Sore throat 08/02/2017  . Urinary frequency 06/30/2017  . Status post total replacement of left hip 03/08/2017  . Abnormal nuclear stress test   . Obesity, Class I, BMI 30-34.9 01/26/2017  . Radiculopathy due to lumbar intervertebral disc disorder 10/21/2016  . Osteoarthritis of left hip 08/16/2016  . Atrial tachycardia, paroxysmal ([place]) 06/18/2016  . Chronic fatigue 04/27/2016  . Coronary artery disease   . Hepatic steatosis 06/12/2015  . Dyspnea 05/01/2015  . Right trigeminal neuralgia  11/27/2014  . Chronic chest pain 10/30/2014  . Vitamin B12 deficiency   . OSA (obstructive sleep apnea) 09/04/2014  . Headache 06/20/2014  . Controlled diabetes mellitus type 2 with complications ([place]) [id number]  . Zenker diverticulum 04/19/2014  . COPD, moderate ([place]) 03/26/2014  . S/P CABG x 3 02/07/2014  . Occlusion and stenosis of vertebral artery 11/12/2013  . Skin rash 04/17/2013  . Healthcare maintenance 01/09/2013  . Hyperlipidemia associated with type 2 diabetes mellitus ([place]) 01/09/2013  . Lumbosacral radiculopathy at S1 09/14/2012  . Migraine   . Arthritis   . History of AAA (abdominal aortic aneurysm) repair 05/01/2012  . Adenomatous polyps 03/24/2012  . Ex-smoker 03/24/2012  . Hypertension 03/24/2012  . History of bladder cancer 03/24/2012   Past Medical History:  Diagnosis Date  . AAA (abdominal aortic aneurysm) ([place]) 03/25/12   s/p endovascular repair  . Adenomatous polyp 06/2009  . Atrial tachycardia, paroxysmal ([place]) 06/18/2016  . Bladder cancer ([place]) 08/16/2008   recurrence 2015 and 2018 treated with office fulguration  . CAD (coronary artery disease), native coronary artery    S/p 3v CABG 01/2014   . Cataract 2019   bilateral eyes  . Childhood asthma    as a child  . Complication of anesthesia    "takes me a year to get over"  . Contact dermatitis    atypical [name])  . Controlled diabetes mellitus type 2 with complications ([place]) [id number]   CAD   . COPD (chronic obstructive pulmonary disease) ([place])   . Coronary artery disease    a.2015: CABG w/ LIMA-LAD, RIMA-OM, SVG-RCA b. Cath on 07/10/15 w/ patent LIMA-LAD and RIMA-OM. SVG-RCA occluded but collaterals present  . Diverticulosis 2013   severe by CT and colonoscopy  . Environmental allergies    improved as ages  . Fracture of lateral malleolus of left ankle 08/29/2013  . Headache    hasn't had migraines in years  . Hepatic steatosis 06/2015   by [place]  . Hx of migraines   . Hypertension   .  Lactose intolerance 11/14/2018   Endorses for years - avoids dairy products  . Lumbosacral radiculopathy at S1 03/2012   left, with spinal stenosis (MRI 04/2013) improved with TF ESI L5/S1 and S1/2 ([place]  Bethea)  . OSA (obstructive sleep apnea) 09/04/2014   Severe with AHI 35/hr, uses BIPAP  . Overweight (BMI 25.0-29.9) 01/26/2017  . Resistance to clopidogrel 2015   drug metabolism panel run - asked to scan  . Spinal stenosis    LS1 nerve root impingement from bulging disc  . Vitamin B12 deficiency   . Zenker diverticulum    Family History  Problem Relation Age of Onset  . Diabetes Mother   . Arrhythmia Mother        pacemaker  . COPD Mother   . Thyroid disease Mother   . Hyperlipidemia Mother   . Hypertension Mother   . Bladder Cancer Mother   . Diabetes Father   . CAD Father 98       CHF, MI  . Dementia Father   . Heart attack Father   . Diabetes Sister   . Heart attack Maternal Grandmother   . AAA (abdominal aortic aneurysm) Maternal Grandmother   . Colon cancer Neg Hx   . Stomach cancer Neg Hx    Past Surgical History:  Procedure Laterality Date  . ABDOMINAL AORTIC ANEURYSM REPAIR  03/25/12   endovascular  . ABI  05/2013   WNL, L TBI low at 0.66  . BALLOON DILATION N/A 04/20/2019   Procedure: BALLOON DILATION;  Surgeon: [name], MD;  Location: [facility] ENDOSCOPY;  Service: Endoscopy;  Laterality: N/A;  pyloric   . BILIARY DILATION  04/20/2019   Procedure: BILIARY DILATION;  Surgeon: [name], MD;  Location: [facility] ENDOSCOPY;  Service: Endoscopy;;  . Bladder cancer  March 2010 and Oct. 2015   Ernst Spell) x 2  . CARDIAC CATHETERIZATION N/A 07/10/2015   Procedure: Left Heart Cath and Cors/Grafts Angiography;  Surgeon: [name], MD;  Location: [place] CV LAB;  Service: Cardiovascular;  Laterality: N/A;  . CATARACT EXTRACTION W/ INTRAOCULAR LENS IMPLANT Bilateral 07/2018, 08/2018  . CHOLECYSTECTOMY N/A 04/19/2019   Procedure: LAPAROSCOPIC CHOLECYSTECTOMY;   Surgeon: [name], MD;  Location: [place];  Service: General;  Laterality: N/A;  . COLONOSCOPY  06/2012   hyperplastic polyp, diverticulosis ([place]) rec rpt 5 yrs  . COLONOSCOPY WITH PROPOFOL N/A 08/10/2017   TA, rpt 5 yrs Ardis Hughs, Melene Plan, MD)  . CORONARY ARTERY BYPASS GRAFT N/A 02/07/2014   Procedure: CORONARY ARTERY BYPASS GRAFTING (CABG);  Surgeon: [name], MD;  Location: [place];  Service: Open Heart Surgery;  Laterality: N/A;  CABG X 3, BILATERAL LIMA, EVH  . CYSTOSCOPY  08/16/08   Bladder Cancer  . EPIDURAL BLOCK INJECTION Left 09/2014, 10/2014, 12/2014   medial L2,3,4, dorsal L5 ramus blocks x2, L L5/S1 and S1/2 transforaminal ESI (Dalton Grimes)  . ERCP N/A 04/20/2019   Procedure: ENDOSCOPIC RETROGRADE CHOLANGIOPANCREATOGRAPHY (ERCP);  Surgeon: [name], MD;  Location: [facility] ENDOSCOPY;  Service: Endoscopy;  Laterality: N/A;  . ESI  04/2013, 06/2013   L L5S1, S12 transforaminal ESI (Dr.  [name])  . ESI Left 04/2014, 05/2014, 06/2014   L5/S1, S1/2; rpt; L4/5  . ESI  03/2016   R L5/S1 interlaminar ESI  . ESOPHAGEAL MANOMETRY N/A 08/10/2017   Procedure: ESOPHAGEAL MANOMETRY (EM);  Surgeon: [name], MD;  Location: [facility] ENDOSCOPY;  Service: Endoscopy;  Laterality: N/A;  . ESOPHAGOGASTRODUODENOSCOPY (EGD) WITH PROPOFOL N/A 08/10/2017   Procedure: ESOPHAGOGASTRODUODENOSCOPY (EGD) WITH PROPOFOL;  Surgeon: [name], MD;  Location: [facility] ENDOSCOPY;  Service: Endoscopy;  Laterality: N/A;  . FULGURATION OF BLADDER TUMOR  01/2017   recurrent 88mm L lateral  wall papillary transitional cell carcinoma ([place])  . INTRAOPERATIVE CHOLANGIOGRAM N/A 04/19/2019   Procedure: Intraoperative Cholangiogram;  Surgeon: [name], MD;  Location: [place];  Service: General;  Laterality: N/A;  . INTRAOPERATIVE TRANSESOPHAGEAL ECHOCARDIOGRAM N/A 02/07/2014   Procedure: INTRAOPERATIVE TRANSESOPHAGEAL ECHOCARDIOGRAM;  Surgeon: [name], MD;  Location: [place];  Service:  Open Heart Surgery;  Laterality: N/A;  . LEFT HEART CATH AND CORS/GRAFTS ANGIOGRAPHY N/A 02/11/2017   Procedure: LEFT HEART CATH AND CORS/GRAFTS ANGIOGRAPHY;  Surgeon: [name], MD;  Location: [place] CV LAB;  Service: Cardiovascular;  Laterality: N/A;  . LEFT HEART CATH AND CORS/GRAFTS ANGIOGRAPHY N/A 02/02/2019   Procedure: LEFT HEART CATH AND CORS/GRAFTS ANGIOGRAPHY;  Surgeon: Belva Crome, MD;  Location: [place] CV LAB;  Service: Cardiovascular;  Laterality: N/A;  . LEFT HEART CATHETERIZATION WITH CORONARY ANGIOGRAM N/A 02/01/2014   Procedure: LEFT HEART CATHETERIZATION WITH CORONARY ANGIOGRAM;  Surgeon: [name], MD;  Location: [facility] CATH LAB;  Service: Cardiovascular;  Laterality: N/A;  . peroral endoscopic myotomy  06/2019   EGD - treated zenker's diverticulum, patent shartzki ring, irregular Z line, small HH (Dr Harl Bowie at [facility])  . PRP epidural injection Left 11/2015   L5/S1, S1/2 transforaminal epidural PRP injections under fluoroscopy ([place]-Bethea)  . REMOVAL OF STONES  04/20/2019   Procedure: REMOVAL OF STONES;  Surgeon: [name], MD;  Location: [facility] ENDOSCOPY;  Service: Endoscopy;;  . REPLACEMENT TOTAL HIP W/  RESURFACING IMPLANTS Left   . SPHINCTEROTOMY  04/20/2019   Procedure: SPHINCTEROTOMY;  Surgeon: [name], MD;  Location[facility] ENDOSCOPY;  Service: Endoscopy;;  . [place]  . TOTAL HIP ARTHROPLASTY Left 03/08/2017  . TOTAL HIP ARTHROPLASTY Left 03/08/2017   Procedure: LEFT TOTAL HIP ARTHROPLASTY ANTERIOR APPROACH;  Surgeon: [name], MD;  Location: [place];  Service: Orthopedics;  Laterality: Left;  [name] VASECTOMY    . ZENKER'S DIVERTICULECTOMY  09/2017  . ZENKER'S DIVERTICULECTOMY ENDOSCOPIC  06/2019   [facility] [place] History   Occupational History  . Occupation: Self Employed  Tobacco Use  . Smoking status: Former Smoker    Packs/day: 1.00    Years: 44.00    Pack years: 44.00    Types:  Cigarettes    Start date: 19    Quit date: 02/05/2014    Years since quitting: 5.7  . Smokeless tobacco: Never Used  Substance and Sexual Activity  . Alcohol use: Yes    Alcohol/week: 0.0 standard drinks    Comment: 3-4 drinks per month  . Drug use: No  . Sexual activity: Not Currently

## 2019-10-31 NOTE — Telephone Encounter (Signed)
Patient was on norvasc 5 mg by mouth daily in the past. Sent prescription to patient's pharmacy.

## 2019-11-13 ENCOUNTER — Ambulatory Visit: Payer: PPO | Admitting: Physical Medicine and Rehabilitation

## 2019-11-13 ENCOUNTER — Other Ambulatory Visit: Payer: Self-pay

## 2019-11-13 ENCOUNTER — Encounter: Payer: Self-pay | Admitting: Physical Medicine and Rehabilitation

## 2019-11-13 ENCOUNTER — Ambulatory Visit: Payer: Self-pay

## 2019-11-13 VITALS — BP 101/68 | HR 81

## 2019-11-13 DIAGNOSIS — M47816 Spondylosis without myelopathy or radiculopathy, lumbar region: Secondary | ICD-10-CM

## 2019-11-13 MED ORDER — METHYLPREDNISOLONE ACETATE 80 MG/ML IJ SUSP
40.0000 mg | Freq: Once | INTRAMUSCULAR | Status: AC
  Administered 2019-11-13: 14:00:00 40 mg

## 2019-11-13 NOTE — Progress Notes (Signed)
Pt states in the middle of the lower back and left hip (no groin pain). Pt states no major changes since last visit 10/30/19.   .Numeric Pain Rating Scale and Functional Assessment Average Pain 7   In the last MONTH (on 0-10 scale) has pain interfered with the following?  1. General activity like being  able to carry out your everyday physical activities such as walking, climbing stairs, carrying groceries, or moving a chair?  Rating(8)   +Driver, -BT, -Dye Allergies.

## 2019-11-14 DIAGNOSIS — J449 Chronic obstructive pulmonary disease, unspecified: Secondary | ICD-10-CM | POA: Diagnosis not present

## 2019-11-14 DIAGNOSIS — G4733 Obstructive sleep apnea (adult) (pediatric): Secondary | ICD-10-CM | POA: Diagnosis not present

## 2019-11-14 NOTE — Procedures (Signed)
Lumbar Diagnostic Facet Joint Nerve Block with Fluoroscopic Guidance   Patient: Carl Lee      Date of Birth: [birth year] MRN: [id number] PCP: [name], MD      Visit Date: 11/13/2019   Universal Protocol:    Date/Time: 05/05/218:19 AM  Consent Given By: the patient  Position: PRONE  Additional Comments: Vital signs were monitored before and after the procedure. Patient was prepped and draped in the usual sterile fashion. The correct patient, procedure, and site was verified.   Injection Procedure Details:  Procedure Site One Meds Administered:  Meds ordered this encounter  Medications  . methylPREDNISolone acetate (DEPO-MEDROL) injection 40 mg     Laterality: Right  Location/Site:  L4-L5 L5-S1  Needle size: 22 ga.  Needle type:spinal  Needle Placement: Oblique pedical  Findings:   -Comments: There was excellent flow of contrast along the articular pillars without intravascular flow.  Procedure Details: The fluoroscope beam is vertically oriented in AP and then obliqued 15 to 20 degrees to the ipsilateral side of the desired nerve to achieve the "Scotty dog" appearance.  The skin over the target area of the junction of the superior articulating process and the transverse process (sacral ala if blocking the L5 dorsal rami) was locally anesthetized with a 1 ml volume of 1% Lidocaine without Epinephrine.  The spinal needle was inserted and advanced in a trajectory view down to the target.   After contact with periosteum and negative aspirate for blood and CSF, correct placement without intravascular or epidural spread was confirmed by injecting 0.5 ml. of Isovue-250.  A spot radiograph was obtained of this image.    Next, a 0.5 ml. volume of the injectate described above was injected. The needle was then redirected to the other facet joint nerves mentioned above if needed.  Prior to the procedure, the patient was given a Pain Diary which was completed for  baseline measurements.  After the procedure, the patient rated their pain every 30 minutes and will continue rating at this frequency for a total of 5 hours.  The patient has been asked to complete the Diary and return to [place] by mail, fax or hand delivered as soon as possible.   Additional Comments:  The patient tolerated the procedure well Dressing: 2 x 2 sterile gauze and Band-Aid    Post-procedure details: Patient was observed during the procedure. Post-procedure instructions were reviewed.  Patient left the clinic in stable condition.

## 2019-11-14 NOTE — Progress Notes (Signed)
Carl Lee - 64 y.o. male MRN [id number]  Date of birth: 1955/11/16  Office Visit Note: Visit Date: 11/13/2019 PCP: [name], MD Referred by: [name], MD  Subjective: Chief Complaint  Patient presents with  . Lower Back - Pain  . Left Hip - Pain   HPI:  Carl Lee is a 64 y.o. male who comes in today For planned repeat right L3-4 and L4-5 medial branch block for axial midline to right low back pain.  Prior injection diagnostically gave him a lot of relief.  He has had history of prior lumbar foraminal stenosis but has no radicular complaints at this point.  He does have prior left total hip arthroplasty and chronic history of greater trochanteric pain syndrome or tendinitis bursitis.  Last fluoroscopically guided injection has helped quite a bit.  He says it helped his back treatment some degree of atelectasis probably a little bit related to the steroid itself.  He is moving to [place] very soon and is asking if we know anyone in that area.  I have had one other patient moved [place] recently and had recommendations for Dr. Christy Sartorius at [facility].  I did give him some information.  ROS Otherwise per HPI.  Assessment & Plan: Visit Diagnoses:  1. Spondylosis without myelopathy or radiculopathy, lumbar region     Plan: No additional findings.   Meds & Orders:  Meds ordered this encounter  Medications  . methylPREDNISolone acetate (DEPO-MEDROL) injection 40 mg    Orders Placed This Encounter  Procedures  . Facet Injection  . XR C-ARM NO REPORT    Follow-up: Return for Review Pain Diary.   Procedures: No procedures performed  Lumbar Diagnostic Facet Joint Nerve Block with Fluoroscopic Guidance   Patient: Carl Lee      Date of Birth: 1956-06-20 MRN: [id number] PCP: [name], MD      Visit Date: 11/13/2019   Universal Protocol:    Date/Time: 05/05/218:19 AM  Consent Given By: the patient  Position: PRONE  Additional  Comments: Vital signs were monitored before and after the procedure. Patient was prepped and draped in the usual sterile fashion. The correct patient, procedure, and site was verified.   Injection Procedure Details:  Procedure Site One Meds Administered:  Meds ordered this encounter  Medications  . methylPREDNISolone acetate (DEPO-MEDROL) injection 40 mg     Laterality: Right  Location/Site:  L4-L5 L5-S1  Needle size: 22 ga.  Needle type:spinal  Needle Placement: Oblique pedical  Findings:   -Comments: There was excellent flow of contrast along the articular pillars without intravascular flow.  Procedure Details: The fluoroscope beam is vertically oriented in AP and then obliqued 15 to 20 degrees to the ipsilateral side of the desired nerve to achieve the "Scotty dog" appearance.  The skin over the target area of the junction of the superior articulating process and the transverse process (sacral ala if blocking the L5 dorsal rami) was locally anesthetized with a 1 ml volume of 1% Lidocaine without Epinephrine.  The spinal needle was inserted and advanced in a trajectory view down to the target.   After contact with periosteum and negative aspirate for blood and CSF, correct placement without intravascular or epidural spread was confirmed by injecting 0.5 ml. of Isovue-250.  A spot radiograph was obtained of this image.    Next, a 0.5 ml. volume of the injectate described above was injected. The needle was then redirected to the other facet joint nerves mentioned above  if needed.  Prior to the procedure, the patient was given a Pain Diary which was completed for baseline measurements.  After the procedure, the patient rated their pain every 30 minutes and will continue rating at this frequency for a total of 5 hours.  The patient has been asked to complete the Diary and return to [place] by mail, fax or hand delivered as soon as possible.   Additional Comments:  The patient  tolerated the procedure well Dressing: 2 x 2 sterile gauze and Band-Aid    Post-procedure details: Patient was observed during the procedure. Post-procedure instructions were reviewed.  Patient left the clinic in stable condition.    Clinical History: MRI LUMBAR SPINE WITHOUT CONTRAST  TECHNIQUE: Multiplanar, multisequence MR imaging of the lumbar spine was performed. No intravenous contrast was administered. Metal artifact reduction techniques were utilized.  COMPARISON:  11/27/2015  FINDINGS: Severe metallic susceptibility artifact resulting from aortoiliac stent graft which partially obscures portions of the lumbar spine.  Segmentation:  Standard.  Alignment:  Slight lumbar dextrocurvature.  No static listhesis.  Vertebrae: The L2 through L5 vertebral bodies are largely obscured by susceptibility artifact. Within those limitations. No fracture, bone lesion, or evidence of discitis identified.  Conus medullaris and cauda equina: Conus extends to the L2 level. Conus and cauda equina appear normal.  Paraspinal and other soft tissues: Aortoiliac stent graft.  Disc levels:  T12-L1: No significant disc protrusion, foraminal stenosis, or canal stenosis.  L1-L2: Minimal disc bulge.  No foraminal or canal stenosis.  L2-L3: Evaluation of the canal on axial images obscured by susceptibility artifact. On the sagittal sequences. There is a focal right paracentral disc protrusion, enlarged from prior. At least mild-to-moderate canal stenosis. No significant foraminal stenosis evident.  L3-L4: Diffuse disc bulge and mild bilateral facet arthrosis results in mild canal stenosis without foraminal stenosis. Findings appears similar to prior.  L4-L5: Diffuse disc bulge and moderate bilateral facet arthrosis resulting in mild-to-moderate canal stenosis and mild right foraminal stenosis. Findings slightly progressed from prior.  L5-S1: Diffuse disc bulge with  left paracentral protrusion and mild bilateral facet arthrosis. There is left subarticular recess stenosis. No significant foraminal or canal stenosis. Findings similar to prior.  IMPRESSION: 1. Examination is degraded by extensive metallic susceptibility artifact related to aortoiliac stent graft. 2. Worsened right paracentral disc protrusion at L2-3 which appears to cause at least mild-to-moderate canal stenosis, suboptimally evaluated. 3. Slight progression of degenerative disc disease at L4-5 where there is mild-to-moderate canal stenosis and mild right foraminal stenosis.   Electronically Signed   By: Davina Poke M.D.   On: 03/31/2019 17:17     Objective:  VS:  HT:    WT:   BMI:     BP:101/68  HR:81bpm  TEMP: ( )  RESP:  Physical Exam Vitals and nursing note reviewed.  Constitutional:      General: He is not in acute distress.    Appearance: Normal appearance. He is well-developed.  HENT:     Head: Normocephalic and atraumatic.  Eyes:     Conjunctiva/sclera: Conjunctivae normal.     Pupils: Pupils are equal, round, and reactive to light.  Cardiovascular:     Rate and Rhythm: Normal rate.     Pulses: Normal pulses.     Heart sounds: Normal heart sounds.  Pulmonary:     Effort: Pulmonary effort is normal. No respiratory distress.  Musculoskeletal:     Cervical back: Normal range of motion and neck supple. No rigidity.  Right lower leg: No edema.     Left lower leg: No edema.     Comments: Concordant pain with extension and facet loading on the right.  Skin:    General: Skin is warm and dry.     Findings: No erythema or rash.  Neurological:     General: No focal deficit present.     Mental Status: He is alert and oriented to person, place, and time.     Sensory: No sensory deficit.     Coordination: Coordination normal.     Gait: Gait normal.  Psychiatric:        Mood and Affect: Mood normal.        Behavior: Behavior normal.     Ortho  Exam Imaging: XR C-ARM NO REPORT  Result Date: 11/13/2019 Please see Notes tab for imaging impression.

## 2019-11-16 ENCOUNTER — Telehealth: Payer: Self-pay | Admitting: *Deleted

## 2019-11-16 NOTE — Telephone Encounter (Signed)
Faxed prior authorization to [facility], case is pending.

## 2019-12-06 NOTE — Progress Notes (Signed)
Cardiology Office Note    Date:  12/12/2019   ID:  Carl Lee, Carl Lee [birth year], MRN [id number]  PCP:  [name], MD  Cardiologist: [name], MD EPS: None  No chief complaint on file.   History of Present Illness:  Carl Lee is a 64 y.o. male with history of CAD s/p CABG in 2015 with Dr. [name] (L-LAD, RIMA-OM, S-PDA) AAA s/p endovascular repair in 9/13 by Dr. [name], HTN, HL, DM2, OSA on CPAP, tobacco abuse-quit 2020 and , recurrent bladder CA, carotid disease.    cath 02/11/17 for preop right hip surgery clearance. Stable with patent LIMA to mid LAD, RIMA to OM2 Occluded SVG PDA native RCA fills bridging collaterals EF 45-50% EF was normal by TTE done 11/11/15, residual AAA 4.0 cm 2017.   Chronic and multiple types of chest pain intolerant to Imdur. On ranexa and capzasin cream to chest for chest wall pain.  Chronic dyspnea felt secondary to asthmatic COPD followed by Dr. [name].   Patient needed clearance for Zenker's Diverticulum by Dr. Servando Snare and was having a lot of chest pressure and shortness of breath walking up stairs.   Cardiac cath 02/02/19 patent LIMA to LAD, patent RIMA to OM, total SVG to the distal RCA, total proximal LAD, origin to proximal graft lesion is 100% stenosed, total proximal RCA distal RCA fills by collaterals from first septal perforator.  Mild mid to distal anterior wall hypokinesis EF 45 to 50%.  No change from 2 years ago.  Patient has had chronic chest pain since bypass surgery.  Dr. Servando Snare evaluated the patient in the hospital and referred to [place] with Nancy Nordmann. See below    04/19/19:  Laparoscopic cholecystectomy with Dr [name] 06/21/19 had endoscopy with myotomy for Zenkers and Schatzki Ring Dysphagia And problems swallowing mostly better   BP runs low at times discussed stopping Norvasc   Past Medical History:  Diagnosis Date   AAA (abdominal aortic aneurysm) ([place]) 03/25/12   s/p endovascular repair    Adenomatous polyp 06/2009   Atrial tachycardia, paroxysmal ([place]) 06/18/2016   Bladder cancer ([place]) 08/16/2008   recurrence 2015 and 2018 treated with office fulguration   CAD (coronary artery disease), native coronary artery    S/p 3v CABG 01/2014    Cataract 2019   bilateral eyes   Childhood asthma    as a child   Complication of anesthesia    "takes me a year to get over"   Contact dermatitis    atypical [name])   Controlled diabetes mellitus type 2 with complications ([place]) [id number]   CAD    COPD (chronic obstructive pulmonary disease) ([place])    Coronary artery disease    a.2015: CABG w/ LIMA-LAD, RIMA-OM, SVG-RCA b. Cath on 07/10/15 w/ patent LIMA-LAD and RIMA-OM. SVG-RCA occluded but collaterals present   Diverticulosis 2013   severe by CT and colonoscopy   Environmental allergies    improved as ages   Fracture of lateral malleolus of left ankle 08/29/2013   Headache    hasn't had migraines in years   Hepatic steatosis 06/2015   by [place]   Hx of migraines    Hypertension    Lactose intolerance 11/14/2018   Endorses for years - avoids dairy products   Lumbosacral radiculopathy at S1 03/2012   left, with spinal stenosis (MRI 04/2013) improved with TF ESI L5/S1 and S1/2 ([place])   OSA (obstructive sleep apnea) 09/04/2014   Severe with AHI 35/hr, uses BIPAP  Overweight (BMI 25.0-29.9) 01/26/2017   Resistance to clopidogrel 2015   drug metabolism panel run - asked to scan   Spinal stenosis    LS1 nerve root impingement from bulging disc   Vitamin B12 deficiency    Zenker diverticulum     Past Surgical History:  Procedure Laterality Date   ABDOMINAL AORTIC ANEURYSM REPAIR  03/25/12   endovascular   ABI  05/2013   WNL, L TBI low at 0.66   BALLOON DILATION N/A 04/20/2019   Procedure: BALLOON DILATION;  Surgeon: [name], MD;  Location: [facility] ENDOSCOPY;  Service: Endoscopy;  Laterality: N/A;  pyloric    BILIARY DILATION  04/20/2019    Procedure: BILIARY DILATION;  Surgeon: [name], MD;  Location: [facility] ENDOSCOPY;  Service: Endoscopy;;   Bladder cancer  March 2010 and Oct. 2015   [place]) x 2   CARDIAC CATHETERIZATION N/A 07/10/2015   Procedure: Left Heart Cath and Cors/Grafts Angiography;  Surgeon: [name], MD;  Location: [place] CV LAB;  Service: Cardiovascular;  Laterality: N/A;   CATARACT EXTRACTION W/ INTRAOCULAR LENS IMPLANT Bilateral 07/2018, 08/2018   CHOLECYSTECTOMY N/A 04/19/2019   Procedure: LAPAROSCOPIC CHOLECYSTECTOMY;  Surgeon: [name], MD;  Location: [place];  Service: General;  Laterality: N/A;   COLONOSCOPY  06/2012   hyperplastic polyp, diverticulosis ([place]) rec rpt 5 yrs   COLONOSCOPY WITH PROPOFOL N/A 08/10/2017   TA, rpt 5 yrs Ardis Hughs, Melene Plan, MD)   CORONARY ARTERY BYPASS GRAFT N/A 02/07/2014   Procedure: CORONARY ARTERY BYPASS GRAFTING (CABG);  Surgeon: [name], MD;  Location: [place];  Service: Open Heart Surgery;  Laterality: N/A;  CABG X 3, BILATERAL LIMA, EVH   CYSTOSCOPY  08/16/08   Bladder Cancer   EPIDURAL BLOCK INJECTION Left 09/2014, 10/2014, 12/2014   medial L2,3,4, dorsal L5 ramus blocks x2, L L5/S1 and S1/2 transforaminal ESI [name])   ERCP N/A 04/20/2019   Procedure: ENDOSCOPIC RETROGRADE CHOLANGIOPANCREATOGRAPHY (ERCP);  Surgeon: [name], MD;  Location: [name] ENDOSCOPY;  Service: Endoscopy;  Laterality: N/A;   ESI  04/2013, 06/2013   L L5S1, S12 transforaminal ESI (Dr.  [name])   ESI Left 04/2014, 05/2014, 06/2014   L5/S1, S1/2; rpt; L4/5   ESI  03/2016   R L5/S1 interlaminar ESI   ESOPHAGEAL MANOMETRY N/A 08/10/2017   Procedure: ESOPHAGEAL MANOMETRY (EM);  Surgeon: [name], MD;  Location: [facility] ENDOSCOPY;  Service: Endoscopy;  Laterality: N/A;   ESOPHAGOGASTRODUODENOSCOPY (EGD) WITH PROPOFOL N/A 08/10/2017   Procedure: ESOPHAGOGASTRODUODENOSCOPY (EGD) WITH PROPOFOL;  Surgeon: [name], MD;  Location: [facility]  ENDOSCOPY;  Service: Endoscopy;  Laterality: N/A;   FULGURATION OF BLADDER TUMOR  01/2017   recurrent 15mm L lateral wall papillary transitional cell carcinoma ([place])   INTRAOPERATIVE CHOLANGIOGRAM N/A 04/19/2019   Procedure: Intraoperative Cholangiogram;  Surgeon: [name], MD;  Location: [place];  Service: General;  Laterality: N/A;   INTRAOPERATIVE TRANSESOPHAGEAL ECHOCARDIOGRAM N/A 02/07/2014   Procedure: INTRAOPERATIVE TRANSESOPHAGEAL ECHOCARDIOGRAM;  Surgeon: [name], MD;  Location: [place];  Service: Open Heart Surgery;  Laterality: N/A;   LEFT HEART CATH AND CORS/GRAFTS ANGIOGRAPHY N/A 02/11/2017   Procedure: LEFT HEART CATH AND CORS/GRAFTS ANGIOGRAPHY;  Surgeon: [name], MD;  Location: [place] CV LAB;  Service: Cardiovascular;  Laterality: N/A;   LEFT HEART CATH AND CORS/GRAFTS ANGIOGRAPHY N/A 02/02/2019   Procedure: LEFT HEART CATH AND CORS/GRAFTS ANGIOGRAPHY;  Surgeon: Belva Crome, MD;  Location: [place] CV LAB;  Service: Cardiovascular;  Laterality: N/A;   LEFT HEART CATHETERIZATION WITH CORONARY ANGIOGRAM N/A 02/01/2014   Procedure: LEFT HEART CATHETERIZATION WITH CORONARY ANGIOGRAM;  Surgeon: [name], MD;  Location: [facility] CATH LAB;  Service: Cardiovascular;  Laterality: N/A;   peroral endoscopic myotomy  06/2019   EGD - treated zenker's diverticulum, patent shartzki ring, irregular Z line, small HH ([facility] Harl Bowie [facility])   PRP epidural injection Left 11/2015   L5/S1, S1/2 transforaminal epidural PRP injections under fluoroscopy ([place])   REMOVAL OF STONES  04/20/2019   Procedure: REMOVAL OF STONES;  Surgeon: [name], MD;  Location: [name] ENDOSCOPY;  Service: Endoscopy;;   REPLACEMENT TOTAL HIP W/  RESURFACING IMPLANTS Left    SPHINCTEROTOMY  04/20/2019   Procedure: SPHINCTEROTOMY;  Surgeon: [name], MD;  Location: [facility] ENDOSCOPY;  Service: Endoscopy;;   TONSILLECTOMY AND [place]  Left 03/08/2017   TOTAL HIP ARTHROPLASTY Left 03/08/2017   Procedure: LEFT TOTAL HIP ARTHROPLASTY ANTERIOR APPROACH;  Surgeon: [name], MD;  Location: [place];  Service: Orthopedics;  Laterality: Left;   VASECTOMY     ZENKER'S DIVERTICULECTOMY  09/2017   ZENKER'S DIVERTICULECTOMY ENDOSCOPIC  06/2019   [facility] at [facility]    Current Medications: Current Meds  Medication Sig   acetaminophen (TYLENOL) 325 MG tablet Take 2 tablets (650 mg total) by mouth every 6 (six) hours as needed for moderate pain or headache.   albuterol (PROAIR HFA) 108 (90 Base) MCG/ACT inhaler Inhale 2 puffs into the lungs every 4 (four) hours as needed for wheezing or shortness of breath.   amLODipine (NORVASC) 5 MG tablet Take 1 tablet (5 mg total) by mouth daily.   aspirin EC 81 MG tablet Take 1 tablet (81 mg total) by mouth daily.   atorvastatin (LIPITOR) 20 MG tablet TAKE 1 TABLET (20 MG TOTAL) BY MOUTH DAILY AT 6 PM.   cholecalciferol (VITAMIN D3) 25 MCG (1000 UT) tablet Take 1,000 Units by mouth daily.   famotidine (PEPCID) 20 MG tablet TAKE 1 TABLET BY MOUTH TWICE A DAY   glucose blood (ONE TOUCH ULTRA TEST) test strip CHECK ONCE DAILY AND AS NEEDED   glucose blood test strip CHECK ONCE DAILY AND AS NEEDED   ipratropium-albuterol (DUONEB) 0.5-2.5 (3) MG/3ML SOLN Take 3 mLs by nebulization every 6 (six) hours as needed.   ketotifen (ZADITOR) 0.025 % ophthalmic solution Place 1 drop into both eyes daily as needed (allergies).   metoprolol tartrate (LOPRESSOR) 25 MG tablet TAKE 0.5 TABLETS (12.5 MG TOTAL) BY MOUTH 2 (TWO) TIMES DAILY.   oxyCODONE (OXY IR/ROXICODONE) 5 MG immediate release tablet Take 5 mg by mouth every 4 (four) hours as needed.   pantoprazole (PROTONIX) 40 MG tablet Take 1 tablet (40 mg total) by mouth daily.   tamsulosin (FLOMAX) 0.4 MG CAPS capsule Take 1 capsule (0.4 mg total) by mouth daily after supper.   umeclidinium-vilanterol (ANORO ELLIPTA) 62.5-25 MCG/INH  AEPB Inhale 1 puff into the lungs daily.   vitamin B-12 (CYANOCOBALAMIN) 500 MCG tablet Take 1 tablet (500 mcg total) by mouth daily.     Allergies:   Codeine and Doxycycline   Social History   Socioeconomic History   Marital status: Married    Spouse name: Not on file   Number of children: 2   Years of education: Not on file   Highest education level: Not on file  Occupational History   Occupation: Self Employed  Tobacco Use   Smoking status: Former Smoker  Packs/day: 1.00    Years: 44.00    Pack years: 44.00    Types: Cigarettes    Start date: 54    Quit date: 02/05/2014    Years since quitting: 5.8   Smokeless tobacco: Never Used  Substance and Sexual Activity   Alcohol use: Yes    Alcohol/week: 0.0 standard drinks    Comment: 3-4 drinks per month   Drug use: No   Sexual activity: Not Currently  Other Topics Concern   Not on file  Social History Narrative   Caffeine: 1 cup coffee/day   Lives with wife, 1 dog   grown children   Occupation: Sports administrator scrap/salvage - self employed.  Disability after CABG   Edu: 2 yrs college   Activity: fishing, walking occasionally   Diet: good water, fruits/vegetables daily   Social Determinants of Health   Financial Resource Strain:    Difficulty of Paying Living Expenses:   Food Insecurity:    Worried About Charity fundraiser in the Last Year:    Arboriculturist in the Last Year:   Transportation Needs:    Film/video editor (Medical):    Lack of Transportation (Non-Medical):   Physical Activity:    Days of Exercise per Week:    Minutes of Exercise per Session:   Stress:    Feeling of Stress :   Social Connections:    Frequency of Communication with Friends and Family:    Frequency of Social Gatherings with Friends and Family:    Attends Religious Services:    Active Member of Clubs or Organizations:    Attends Music therapist:    Marital Status:      Family History:   The patient's family history includes AAA (abdominal aortic aneurysm) in his maternal grandmother; Arrhythmia in his mother; Bladder Cancer in his mother; CAD (age of onset: 13) in his father; COPD in his mother; Dementia in his father; Diabetes in his father, mother, and sister; Heart attack in his father and maternal grandmother; Hyperlipidemia in his mother; Hypertension in his mother; Thyroid disease in his mother.   ROS:   Please see the history of present illness.    ROS All other systems reviewed and are negative.   PHYSICAL EXAM:   VS:  BP 112/60    Pulse 78    Ht 5\' 8"  (1.727 m)    Wt 199 lb (90.3 kg)    SpO2 96%    BMI 30.26 kg/m   Physical Exam   Affect appropriate Healthy:  appears stated age 47: normal Neck supple with no adenopathy JVP normal no bruits no thyromegaly Lungs clear with no wheezing and good diaphragmatic motion Heart:  S1/S2 no murmur, no rub, gallop or click PMI normal post sternotomy  Abdomen: benighn, BS positve, no tenderness, no AAA no bruit.  No HSM or HJR post cholecystectomy Distal pulses intact with no bruits No edema Neuro non-focal Skin warm and dry No muscular weakness   Wt Readings from Last 3 Encounters:  12/12/19 199 lb (90.3 kg)  10/30/19 200 lb (90.7 kg)  08/07/19 202 lb 3.2 oz (91.7 kg)      Studies/Labs Reviewed:   EKG:     04/20/19 SR rate 91 nonspecific ST changes  Recent Labs: 07/17/2019: ALT 8; BUN 13; Creatinine, Ser 0.74; Hemoglobin 16.7; Platelets 237.0; Potassium 4.4; Sodium 139; TSH 2.37   Lipid Panel    Component Value Date/Time   CHOL 97 07/17/2019 0829  CHOL 170 01/05/2012 0000   TRIG 117.0 07/17/2019 0829   TRIG 141 01/05/2012 0000   HDL 37.50 (L) 07/17/2019 0829   CHOLHDL 3 07/17/2019 0829   VLDL 23.4 07/17/2019 0829   LDLCALC 36 07/17/2019 0829   LDLDIRECT 62.0 12/28/2017 0827    Additional studies/ records that were reviewed today include:  Cardiac catheterization 02/02/2019  Origin to Prox  Graft lesion is 100% stenosed.    Patent left internal mammary to LAD  Patent right internal mammary to second obtuse marginal  Totally occluded saphenous vein graft to the distal RCA.  Total occlusion of proximal LAD native vessel.  Aneurysm involvement of the proximal circumflex with patent obtuse marginal branches.  Total occlusion of the proximal RCA.  Distal RCA fills by collaterals from first septal perforator.  Mild mid to distal anterior wall hypokinesis.  EF 45 to 50%.  Normal LV hemodynamics.  Overall, when compared to 2 years ago there is no change in anatomy.  Patient has had chronic chest pain since bypass surgery.  Current symptoms are no different than what he has had since bypass surgery.   RECOMMENDATIONS:    As noted above, anatomy is stable.  Chest pain symptoms are chronic and non-ischemic or stable ischemic.  Cleared for upcoming surgery from coronary artery disease and left ventricular function standpoint.      ASSESSMENT:    CAD/CABG  PLAN:    CAD status post CABG in 2015, With cardiac catheterization 02/02/2019 basically unchanged from 2 years ago. Continue medical Rx   HTN:  BP running low d/c norvasc    HLD :  LDL at goal continue statin   PVD:   AAA endovascular repair in 2013 f/u [facility]   OSA on CPAP followed by Dr. Radford Pax   GI:  F/u at [facility] for Zenkers repair and dysphagia with difficulty swallowing   Medication Adjustments/Labs and Tests Ordered: Current medicines are reviewed at length with the patient today.  Concerns regarding medicines are outlined above.  Medication changes, Labs and Tests ordered today are listed in the Patient Instructions below. There are no Patient Instructions on file for this visit.   Signed, [name], MD  12/12/2019 9:11 AM    [facility] [address], [place], [place]  [id number] Phone: [phone]; Fax: [phone]

## 2019-12-12 ENCOUNTER — Encounter: Payer: Self-pay | Admitting: Cardiovascular Disease

## 2019-12-12 ENCOUNTER — Other Ambulatory Visit: Payer: Self-pay

## 2019-12-12 ENCOUNTER — Ambulatory Visit: Payer: PPO | Admitting: Cardiovascular Disease

## 2019-12-12 VITALS — BP 112/60 | HR 78 | Ht 68.0 in | Wt 199.0 lb

## 2019-12-12 DIAGNOSIS — Z951 Presence of aortocoronary bypass graft: Secondary | ICD-10-CM | POA: Diagnosis not present

## 2019-12-12 NOTE — Patient Instructions (Addendum)
Medication Instructions:  Your physician has recommended you make the following change in your medication:  1-STOP amlodipine   *If you need a refill on your cardiac medications before your next appointment, please call your pharmacy*  Lab Work: If you have labs (blood work) drawn today and your tests are completely normal, you will receive your results only by: [name] MyChart Message (if you have MyChart) OR . A paper copy in the mail If you have any lab test that is abnormal or we need to change your treatment, we will call you to review the results.  Follow-Up: At [facility], you and your health needs are our priority.  As part of our continuing mission to provide you with exceptional heart care, we have created designated Provider Care Teams.  These Care Teams include your primary Cardiologist (physician) and Advanced Practice Providers (APPs -  Physician Assistants and Nurse Practitioners) who all work together to provide you with the care you need, when you need it.  We recommend signing up for the patient portal called "MyChart".  Sign up information is provided on this After Visit Summary.  MyChart is used to connect with patients for Virtual Visits (Telemedicine).  Patients are able to view lab/test results, encounter notes, upcoming appointments, etc.  Non-urgent messages can be sent to your provider as well.   To learn more about what you can do with MyChart, go to [email or web address].    Your next appointment:   1 year(s)  The format for your next appointment:   In Person  Provider:   You may see [name], MD or one of the following Advanced Practice Providers on your designated Care Team:    Truitt Merle, NP  Cecilie Kicks, NP  Kathyrn Drown, NP

## 2019-12-24 ENCOUNTER — Ambulatory Visit: Payer: Self-pay

## 2019-12-24 ENCOUNTER — Encounter: Payer: Self-pay | Admitting: Physical Medicine and Rehabilitation

## 2019-12-24 ENCOUNTER — Other Ambulatory Visit: Payer: Self-pay

## 2019-12-24 ENCOUNTER — Ambulatory Visit: Payer: PPO | Admitting: Physical Medicine and Rehabilitation

## 2019-12-24 VITALS — BP 114/78 | HR 80

## 2019-12-24 DIAGNOSIS — M47816 Spondylosis without myelopathy or radiculopathy, lumbar region: Secondary | ICD-10-CM | POA: Diagnosis not present

## 2019-12-24 MED ORDER — METHYLPREDNISOLONE ACETATE 80 MG/ML IJ SUSP
40.0000 mg | Freq: Once | INTRAMUSCULAR | Status: AC
  Administered 2019-12-24: 40 mg

## 2019-12-24 NOTE — Progress Notes (Signed)
Pt states pain in the center of the lower back. Pt states pain started years ago. Lifting and walking for a long time makes pain worse. Resting helps with pain.   .Numeric Pain Rating Scale and Functional Assessment Average Pain 6   In the last MONTH (on 0-10 scale) has pain interfered with the following?  1. General activity like being  able to carry out your everyday physical activities such as walking, climbing stairs, carrying groceries, or moving a chair?  Rating(7)   +Driver, -BT, -Dye Allergies.

## 2019-12-25 NOTE — Procedures (Signed)
Lumbar Facet Joint Nerve Denervation  Patient: Carl Lee      Date of Birth: 10/04/1955 MRN: [id number] PCP: [name], MD      Visit Date: 12/24/2019   Universal Protocol:    Date/Time: 06/15/216:02 AM  Consent Given By: the patient  Position: PRONE  Additional Comments: Vital signs were monitored before and after the procedure. Patient was prepped and draped in the usual sterile fashion. The correct patient, procedure, and site was verified.   Injection Procedure Details:  Procedure Site One Meds Administered:  Meds ordered this encounter  Medications  . methylPREDNISolone acetate (DEPO-MEDROL) injection 40 mg     Laterality: Right  Location/Site:  L4-L5 L5-S1  Needle size: 18 G  Needle type: Radiofrequency cannula  Needle Placement: Along juncture of superior articular process and transverse pocess  Findings:  -Comments: During the placement of the RF cannulas the patient had interesting pain complaints even with just gentle dabbing of a small amount of blood in the skin using a gauze pad he was having a deep aching pain that actually caused him to move.  Again this was just basically with a dabbing on the skin with a gauze pad.  After that he tolerated the rest the procedure very well.  Procedure Details: For each desired target nerve, the corresponding transverse process (sacral ala for the L5 dorsal rami) was identified and the fluoroscope was positioned to square off the endplates of the corresponding vertebral body to achieve a true AP midline view.  The beam was then obliqued 15 to 20 degrees and caudally tilted 15 to 20 degrees to line up a trajectory along the target nerves. The skin over the target of the junction of superior articulating process and transverse process (sacral ala for the L5 dorsal rami) was infiltrated with 62ml of 1% Lidocaine without Epinephrine.  The 18 gauge 33mm active tip outer cannula was advanced in trajectory view to the  target.  This procedure was repeated for each target nerve.  Then, for all levels, the outer cannula placement was fine-tuned and the position was then confirmed with bi-planar imaging.    Test stimulation was done both at sensory and motor levels to ensure there was no radicular stimulation. The target tissues were then infiltrated with 1 ml of 1% Lidocaine without Epinephrine. Subsequently, a percutaneous neurotomy was carried out for 90 seconds at 80 degrees Celsius.  After the completion of the lesion, 1 ml of injectate was delivered. It was then repeated for each facet joint nerve mentioned above. Appropriate radiographs were obtained to verify the probe placement during the neurotomy.   Additional Comments:  No complications occurred Dressing: 2 x 2 sterile gauze and Band-Aid    Post-procedure details: Patient was observed during the procedure. Post-procedure instructions were reviewed.  Patient left the clinic in stable condition.

## 2019-12-25 NOTE — Progress Notes (Signed)
NORVIN OHLIN - 64 y.o. male MRN [id number]  Date of birth: Nov 30, 1955  Office Visit Note: Visit Date: 12/24/2019 PCP: [name], MD Referred by: [name], MD  Subjective: Chief Complaint  Patient presents with  . Lower Back - Pain   HPI: MAIJOR HORNIG is a 64 y.o. male who comes in today For planned radiofrequency ablation of the right L4-5 and L5-S1 facet joints.  This would be the right L3 and L4 medial branches and L5 dorsal rami.  Patient has had double diagnostic medial branch blocks with pain diary showing good relief more than 80% relief temporarily.  He has a history of axial low back pain somewhat midline more to the right with imaging consistent with facet mediated arthropathy without a great deal of stenosis.  No radicular leg pain.  His history is complicated by left total hip replacement and ongoing history of chronic greater trochanteric pain syndrome on that side.  His history is also complicated by significant cardiovascular disease status post bypasses and grafting.  His axial back pain has been going on for many years and he has had multiple procedures with other physicians in the past.  He has had no prior lumbar spine surgery.  ROS Otherwise per HPI.  Assessment & Plan: Visit Diagnoses:  1. Spondylosis without myelopathy or radiculopathy, lumbar region     Plan: No additional findings.   Meds & Orders:  Meds ordered this encounter  Medications  . methylPREDNISolone acetate (DEPO-MEDROL) injection 40 mg    Orders Placed This Encounter  Procedures  . Radiofrequency,Lumbar  . XR C-ARM NO REPORT    Follow-up: No follow-ups on file.   Procedures: No procedures performed  Lumbar Facet Joint Nerve Denervation  Patient: [name]      Date of Birth: July 28, 1955 MRN: [id number] PCP: [name], MD      Visit Date: 12/24/2019   Universal Protocol:    Date/Time: 06/15/216:02 AM  Consent Given By: the patient  Position:  PRONE  Additional Comments: Vital signs were monitored before and after the procedure. Patient was prepped and draped in the usual sterile fashion. The correct patient, procedure, and site was verified.   Injection Procedure Details:  Procedure Site One Meds Administered:  Meds ordered this encounter  Medications  . methylPREDNISolone acetate (DEPO-MEDROL) injection 40 mg     Laterality: Right  Location/Site:  L4-L5 L5-S1  Needle size: 18 G  Needle type: Radiofrequency cannula  Needle Placement: Along juncture of superior articular process and transverse pocess  Findings:  -Comments: During the placement of the RF cannulas the patient had interesting pain complaints even with just gentle dabbing of a small amount of blood in the skin using a gauze pad he was having a deep aching pain that actually caused him to move.  Again this was just basically with a dabbing on the skin with a gauze pad.  After that he tolerated the rest the procedure very well.  Procedure Details: For each desired target nerve, the corresponding transverse process (sacral ala for the L5 dorsal rami) was identified and the fluoroscope was positioned to square off the endplates of the corresponding vertebral body to achieve a true AP midline view.  The beam was then obliqued 15 to 20 degrees and caudally tilted 15 to 20 degrees to line up a trajectory along the target nerves. The skin over the target of the junction of superior articulating process and transverse process (sacral ala for the L5 dorsal rami)  was infiltrated with 18ml of 1% Lidocaine without Epinephrine.  The 18 gauge 82mm active tip outer cannula was advanced in trajectory view to the target.  This procedure was repeated for each target nerve.  Then, for all levels, the outer cannula placement was fine-tuned and the position was then confirmed with bi-planar imaging.    Test stimulation was done both at sensory and motor levels to ensure there was  no radicular stimulation. The target tissues were then infiltrated with 1 ml of 1% Lidocaine without Epinephrine. Subsequently, a percutaneous neurotomy was carried out for 90 seconds at 80 degrees Celsius.  After the completion of the lesion, 1 ml of injectate was delivered. It was then repeated for each facet joint nerve mentioned above. Appropriate radiographs were obtained to verify the probe placement during the neurotomy.   Additional Comments:  No complications occurred Dressing: 2 x 2 sterile gauze and Band-Aid    Post-procedure details: Patient was observed during the procedure. Post-procedure instructions were reviewed.  Patient left the clinic in stable condition.       Clinical History: MRI LUMBAR SPINE WITHOUT CONTRAST  TECHNIQUE: Multiplanar, multisequence MR imaging of the lumbar spine was performed. No intravenous contrast was administered. Metal artifact reduction techniques were utilized.  COMPARISON:  11/27/2015  FINDINGS: Severe metallic susceptibility artifact resulting from aortoiliac stent graft which partially obscures portions of the lumbar spine.  Segmentation:  Standard.  Alignment:  Slight lumbar dextrocurvature.  No static listhesis.  Vertebrae: The L2 through L5 vertebral bodies are largely obscured by susceptibility artifact. Within those limitations. No fracture, bone lesion, or evidence of discitis identified.  Conus medullaris and cauda equina: Conus extends to the L2 level. Conus and cauda equina appear normal.  Paraspinal and other soft tissues: Aortoiliac stent graft.  Disc levels:  T12-L1: No significant disc protrusion, foraminal stenosis, or canal stenosis.  L1-L2: Minimal disc bulge.  No foraminal or canal stenosis.  L2-L3: Evaluation of the canal on axial images obscured by susceptibility artifact. On the sagittal sequences. There is a focal right paracentral disc protrusion, enlarged from prior. At  least mild-to-moderate canal stenosis. No significant foraminal stenosis evident.  L3-L4: Diffuse disc bulge and mild bilateral facet arthrosis results in mild canal stenosis without foraminal stenosis. Findings appears similar to prior.  L4-L5: Diffuse disc bulge and moderate bilateral facet arthrosis resulting in mild-to-moderate canal stenosis and mild right foraminal stenosis. Findings slightly progressed from prior.  L5-S1: Diffuse disc bulge with left paracentral protrusion and mild bilateral facet arthrosis. There is left subarticular recess stenosis. No significant foraminal or canal stenosis. Findings similar to prior.  IMPRESSION: 1. Examination is degraded by extensive metallic susceptibility artifact related to aortoiliac stent graft. 2. Worsened right paracentral disc protrusion at L2-3 which appears to cause at least mild-to-moderate canal stenosis, suboptimally evaluated. 3. Slight progression of degenerative disc disease at L4-5 where there is mild-to-moderate canal stenosis and mild right foraminal stenosis.   Electronically Signed   By: Davina Poke M.D.   On: 03/31/2019 17:17   He reports that he quit smoking about 5 years ago. His smoking use included cigarettes. He started smoking about 50 years ago. He has a 44.00 pack-year smoking history. He has never used smokeless tobacco.  Recent Labs    03/22/19 0913 07/17/19 0829  HGBA1C 6.1* 6.3    Objective:  VS:  HT:    WT:   BMI:     BP:114/78  HR:80bpm  TEMP: ( )  RESP:  Physical Exam  Constitutional:      General: He is not in acute distress.    Appearance: Normal appearance. He is not ill-appearing.  HENT:     Head: Normocephalic and atraumatic.     Right Ear: External ear normal.     Left Ear: External ear normal.  Eyes:     Extraocular Movements: Extraocular movements intact.  Cardiovascular:     Rate and Rhythm: Normal rate.     Pulses: Normal pulses.  Abdominal:     General:  There is no distension.     Palpations: Abdomen is soft.  Musculoskeletal:        General: No tenderness or signs of injury.     Right lower leg: No edema.     Left lower leg: No edema.     Comments: Patient has good distal strength without clonus.  Concordant axial low back pain with extension rotation facet loading to the right.  Skin:    Findings: No erythema or rash.  Neurological:     General: No focal deficit present.     Mental Status: He is alert and oriented to person, place, and time.     Sensory: No sensory deficit.     Motor: No weakness or abnormal muscle tone.     Coordination: Coordination normal.  Psychiatric:        Mood and Affect: Mood normal.        Behavior: Behavior normal.     Ortho Exam  Imaging: XR C-ARM NO REPORT  Result Date: 12/24/2019 Please see Notes tab for imaging impression.   Past Medical/Family/Surgical/Social History: Medications & Allergies reviewed per EMR, new medications updated. Patient Active Problem List   Diagnosis Date Noted  . Hypoxemia 08/07/2019  . Medicare annual wellness visit, subsequent 07/23/2019  . Health maintenance examination 07/23/2019  . Duodenal diverticulum 05/17/2019  . Common bile duct stone   . Hyperbilirubinemia 04/17/2019  . Choledocholithiasis with acute cholecystitis 04/17/2019  . Preoperative clearance 01/23/2019  . Gallstones 11/14/2018  . Lactose intolerance 11/14/2018  . Cough 10/10/2018  . Aortic atherosclerosis ([place]) 08/05/2018  . Personal history of tobacco use, presenting hazards to health 07/18/2018  . GERD (gastroesophageal reflux disease) 07/16/2018  . Advanced care planning/counseling discussion 07/14/2018  . Hives 07/14/2018  . Cataracts, bilateral 05/09/2018  . Macular degeneration, dry 05/09/2018  . Hearing loss 12/29/2017  . Chronic foot pain 12/29/2017  . Diverticulosis of colon without hemorrhage   . Sore throat 08/02/2017  . Urinary frequency 06/30/2017  . Status post total  replacement of left hip 03/08/2017  . Abnormal nuclear stress test   . Obesity, Class I, BMI 30-34.9 01/26/2017  . Radiculopathy due to lumbar intervertebral disc disorder 10/21/2016  . Osteoarthritis of left hip 08/16/2016  . Atrial tachycardia, paroxysmal ([place]) 06/18/2016  . Chronic fatigue 04/27/2016  . Coronary artery disease   . Hepatic steatosis 06/12/2015  . Dyspnea 05/01/2015  . Right trigeminal neuralgia 11/27/2014  . Chronic chest pain 10/30/2014  . Vitamin B12 deficiency   . OSA (obstructive sleep apnea) 09/04/2014  . Headache 06/20/2014  . Controlled diabetes mellitus type 2 with complications ([place]) 16/04/9603  . Zenker diverticulum 04/19/2014  . COPD, moderate ([place]) 03/26/2014  . S/P CABG x 3 02/07/2014  . Occlusion and stenosis of vertebral artery 11/12/2013  . Skin rash 04/17/2013  . Healthcare maintenance 01/09/2013  . Hyperlipidemia associated with type 2 diabetes mellitus ([place]) 01/09/2013  . Lumbosacral radiculopathy at S1 09/14/2012  . Migraine   . Arthritis   .  History of AAA (abdominal aortic aneurysm) repair 05/01/2012  . Adenomatous polyps 03/24/2012  . Ex-smoker 03/24/2012  . Hypertension 03/24/2012  . History of bladder cancer 03/24/2012   Past Medical History:  Diagnosis Date  . AAA (abdominal aortic aneurysm) ([place]) 03/25/12   s/p endovascular repair  . Adenomatous polyp 06/2009  . Atrial tachycardia, paroxysmal ([employer]) 06/18/2016  . Bladder cancer ([place]) 08/16/2008   recurrence 2015 and 2018 treated with office fulguration  . CAD (coronary artery disease), native coronary artery    S/p 3v CABG 01/2014   . Cataract 2019   bilateral eyes  . Childhood asthma    as a child  . Complication of anesthesia    "takes me a year to get over"  . Contact dermatitis    atypical [name])  . Controlled diabetes mellitus type 2 with complications ([place]) 50/03/3817   CAD   . COPD (chronic obstructive pulmonary disease) ([place])   . Coronary artery disease     a.2015: CABG w/ LIMA-LAD, RIMA-OM, SVG-RCA b. Cath on 07/10/15 w/ patent LIMA-LAD and RIMA-OM. SVG-RCA occluded but collaterals present  . Diverticulosis 2013   severe by CT and colonoscopy  . Environmental allergies    improved as ages  . Fracture of lateral malleolus of left ankle 08/29/2013  . Headache    hasn't had migraines in years  . Hepatic steatosis 06/2015   by [place]  . Hx of migraines   . Hypertension   . Lactose intolerance 11/14/2018   Endorses for years - avoids dairy products  . Lumbosacral radiculopathy at S1 03/2012   left, with spinal stenosis (MRI 04/2013) improved with TF ESI L5/S1 and S1/2 ([place])  . OSA (obstructive sleep apnea) 09/04/2014   Severe with AHI 35/hr, uses BIPAP  . Overweight (BMI 25.0-29.9) 01/26/2017  . Resistance to clopidogrel 2015   drug metabolism panel run - asked to scan  . Spinal stenosis    LS1 nerve root impingement from bulging disc  . Vitamin B12 deficiency   . Zenker diverticulum    Family History  Problem Relation Age of Onset  . Diabetes Mother   . Arrhythmia Mother        pacemaker  . COPD Mother   . Thyroid disease Mother   . Hyperlipidemia Mother   . Hypertension Mother   . Bladder Cancer Mother   . Diabetes Father   . CAD Father 11       CHF, MI  . Dementia Father   . Heart attack Father   . Diabetes Sister   . Heart attack Maternal Grandmother   . AAA (abdominal aortic aneurysm) Maternal Grandmother   . Colon cancer Neg Hx   . Stomach cancer Neg Hx    Past Surgical History:  Procedure Laterality Date  . ABDOMINAL AORTIC ANEURYSM REPAIR  03/25/12   endovascular  . ABI  05/2013   WNL, L TBI low at 0.66  . BALLOON DILATION N/A 04/20/2019   Procedure: BALLOON DILATION;  Surgeon: [name], MD;  Location: [facility] ENDOSCOPY;  Service: Endoscopy;  Laterality: N/A;  pyloric   . BILIARY DILATION  04/20/2019   Procedure: BILIARY DILATION;  Surgeon: [name], MD;  Location: [facility] ENDOSCOPY;  Service: Endoscopy;;  .  Bladder cancer  March 2010 and Oct. 2015   [place]nst Spell) x 2  . CARDIAC CATHETERIZATION N/A 07/10/2015   Procedure: Left Heart Cath and Cors/Grafts Angiography;  Surgeon: [name], MD;  Location: [place] CV LAB;  Service: Cardiovascular;  Laterality: N/A;  . CATARACT EXTRACTION W/ INTRAOCULAR LENS IMPLANT Bilateral 07/2018, 08/2018  . CHOLECYSTECTOMY N/A 04/19/2019   Procedure: LAPAROSCOPIC CHOLECYSTECTOMY;  Surgeon: [name], MD;  Location: [place];  Service: General;  Laterality: N/A;  . COLONOSCOPY  06/2012   hyperplastic polyp, diverticulosis ([place]) rec rpt 5 yrs  . COLONOSCOPY WITH PROPOFOL N/A 08/10/2017   TA, rpt 5 yrs Ardis Hughs, Melene Plan, MD)  . CORONARY ARTERY BYPASS GRAFT N/A 02/07/2014   Procedure: CORONARY ARTERY BYPASS GRAFTING (CABG);  Surgeon: [name], MD;  Location: [place];  Service: Open Heart Surgery;  Laterality: N/A;  CABG X 3, BILATERAL LIMA, EVH  . CYSTOSCOPY  08/16/08   Bladder Cancer  . EPIDURAL BLOCK INJECTION Left 09/2014, 10/2014, 12/2014   medial L2,3,4, dorsal L5 ramus blocks x2, L L5/S1 and S1/2 transforaminal ESI ([place])  . ERCP N/A 04/20/2019   Procedure: ENDOSCOPIC RETROGRADE CHOLANGIOPANCREATOGRAPHY (ERCP);  Surgeon: [name], MD;  Location[facility] ENDOSCOPY;  Service: Endoscopy;  Laterality: N/A;  . ESI  04/2013, 06/2013   L L5S1, S12 transforaminal ESI (Dr.  [name])  . ESI Left 04/2014, 05/2014, 06/2014   L5/S1, S1/2; rpt; L4/5  . ESI  03/2016   R L5/S1 interlaminar ESI  . ESOPHAGEAL MANOMETRY N/A 08/10/2017   Procedure: ESOPHAGEAL MANOMETRY (EM);  Surgeon: [name], MD;  Location: [facility] ENDOSCOPY;  Service: Endoscopy;  Laterality: N/A;  . ESOPHAGOGASTRODUODENOSCOPY (EGD) WITH PROPOFOL N/A 08/10/2017   Procedure: ESOPHAGOGASTRODUODENOSCOPY (EGD) WITH PROPOFOL;  Surgeon: [name], MD;  Location: [facility] ENDOSCOPY;  Service: Endoscopy;  Laterality: N/A;  . FULGURATION OF BLADDER TUMOR  01/2017   recurrent 68mm L  lateral wall papillary transitional cell carcinoma ([place])  . INTRAOPERATIVE CHOLANGIOGRAM N/A 04/19/2019   Procedure: Intraoperative Cholangiogram;  Surgeon: [name], MD;  Location: [place];  Service: General;  Laterality: N/A;  . INTRAOPERATIVE TRANSESOPHAGEAL ECHOCARDIOGRAM N/A 02/07/2014   Procedure: INTRAOPERATIVE TRANSESOPHAGEAL ECHOCARDIOGRAM;  Surgeon: [name], MD;  Location: [place];  Service: Open Heart Surgery;  Laterality: N/A;  . LEFT HEART CATH AND CORS/GRAFTS ANGIOGRAPHY N/A 02/11/2017   Procedure: LEFT HEART CATH AND CORS/GRAFTS ANGIOGRAPHY;  Surgeon: [name], MD;  Location: [place] CV LAB;  Service: Cardiovascular;  Laterality: N/A;  . LEFT HEART CATH AND CORS/GRAFTS ANGIOGRAPHY N/A 02/02/2019   Procedure: LEFT HEART CATH AND CORS/GRAFTS ANGIOGRAPHY;  Surgeon: Belva Crome, MD;  Location: [place] CV LAB;  Service: Cardiovascular;  Laterality: N/A;  . LEFT HEART CATHETERIZATION WITH CORONARY ANGIOGRAM N/A 02/01/2014   Procedure: LEFT HEART CATHETERIZATION WITH CORONARY ANGIOGRAM;  Surgeon: [name], MD;  Location: [facility] CATH LAB;  Service: Cardiovascular;  Laterality: N/A;  . peroral endoscopic myotomy  06/2019   EGD - treated zenker's diverticulum, patent shartzki ring, irregular Z line, small HH (Dr Harl Bowie at [facility])  . PRP epidural injection Left 11/2015   L5/S1, S1/2 transforaminal epidural PRP injections under fluoroscopy ([place])  . REMOVAL OF STONES  04/20/2019   Procedure: REMOVAL OF STONES;  Surgeon: [name], MD;  Location: [facility] ENDOSCOPY;  Service: Endoscopy;;  . REPLACEMENT TOTAL HIP W/  RESURFACING IMPLANTS Left   . SPHINCTEROTOMY  04/20/2019   Procedure: SPHINCTEROTOMY;  Surgeon: [name], MD;  Location: [facility] ENDOSCOPY;  Service: Endoscopy;;  . [place]  . TOTAL HIP ARTHROPLASTY Left 03/08/2017  . TOTAL HIP ARTHROPLASTY Left 03/08/2017   Procedure: LEFT TOTAL HIP ARTHROPLASTY ANTERIOR  APPROACH;  Surgeon: [name], MD;  Location: [place];  Service: Orthopedics;  Laterality: Left;  [name] VASECTOMY    . ZENKER'S DIVERTICULECTOMY  09/2017  . ZENKER'S DIVERTICULECTOMY ENDOSCOPIC  06/2019   [facility] at [place] History   Occupational History  . Occupation: Self Employed  Tobacco Use  . Smoking status: Former Smoker    Packs/day: 1.00    Years: 44.00    Pack years: 44.00    Types: Cigarettes    Start date: 34    Quit date: 02/05/2014    Years since quitting: 5.8  . Smokeless tobacco: Never Used  Vaping Use  . Vaping Use: Never used  Substance and Sexual Activity  . Alcohol use: Yes    Alcohol/week: 0.0 standard drinks    Comment: 3-4 drinks per month  . Drug use: No  . Sexual activity: Not Currently

## 2020-01-04 ENCOUNTER — Other Ambulatory Visit: Payer: Self-pay | Admitting: Pulmonary Disease

## 2020-01-08 ENCOUNTER — Ambulatory Visit (INDEPENDENT_AMBULATORY_CARE_PROVIDER_SITE_OTHER): Payer: PPO | Admitting: Physical Medicine and Rehabilitation

## 2020-01-08 ENCOUNTER — Ambulatory Visit: Payer: Self-pay

## 2020-01-08 ENCOUNTER — Encounter: Payer: Self-pay | Admitting: Physical Medicine and Rehabilitation

## 2020-01-08 ENCOUNTER — Other Ambulatory Visit: Payer: Self-pay

## 2020-01-08 DIAGNOSIS — M7062 Trochanteric bursitis, left hip: Secondary | ICD-10-CM

## 2020-01-08 DIAGNOSIS — Z96642 Presence of left artificial hip joint: Secondary | ICD-10-CM

## 2020-01-08 NOTE — Progress Notes (Signed)
Pt states pain in the left hip (no groin pain). Pt states last injection 10/30/19 helped out a lot. Sitting on it and laying on it makes pain worse. Relaxing helps with pain.  .Numeric Pain Rating Scale and Functional Assessment Average Pain 4   In the last MONTH (on 0-10 scale) has pain interfered with the following?  1. General activity like being  able to carry out your everyday physical activities such as walking, climbing stairs, carrying groceries, or moving a chair?  Rating(8)    -Dye Allergies.

## 2020-01-08 NOTE — Progress Notes (Signed)
Carl Lee - 64 y.o. male MRN [id number]  Date of birth: October 04, 1955  Office Visit Note: Visit Date: 01/08/2020 PCP: [name], MD Referred by: [name], MD  Subjective: Chief Complaint  Patient presents with  . Left Hip - Pain   HPI:  Carl Lee is a 64 y.o. male who comes in today at the request of Dr. Laurence Spates for planned Left greater trochanteric injection with fluoroscopic guidance.  The patient has failed conservative care including home exercise, medications, time and activity modification.  This injection will be diagnostic and hopefully therapeutic.  Please see requesting physician notes for further details and justification.  Followed by [name], MD.  Prior injections more than 50% relief for several months.  ROS Otherwise per HPI.  Assessment & Plan: Visit Diagnoses:  1. Greater trochanteric bursitis, left   2. History of total hip arthroplasty, left     Plan: No additional findings.   Meds & Orders: No orders of the defined types were placed in this encounter.   Orders Placed This Encounter  Procedures  . Large Joint Inj  . XR C-ARM NO REPORT    Follow-up: Return if symptoms worsen or fail to improve.   Procedures: Large Joint Inj: L greater trochanter on 01/08/2020 3:18 PM Indications: pain and diagnostic evaluation Details: 22 G 3.5 in needle, fluoroscopy-guided lateral approach  Arthrogram: No  Medications: 4 mL lidocaine 2 %; 4 mL bupivacaine 0.25 %; 60 mg triamcinolone acetonide 40 MG/ML Outcome: tolerated well, no immediate complications  There was excellent flow of contrast outlined the greater trochanteric bursa without vascular uptake. Procedure, treatment alternatives, risks and benefits explained, specific risks discussed. Consent was given by the patient. Immediately prior to procedure a time out was called to verify the correct patient, procedure, equipment, support staff and site/side marked as required.  Patient was prepped and draped in the usual sterile fashion.      No notes on file   Clinical History: MRI LUMBAR SPINE WITHOUT CONTRAST  TECHNIQUE: Multiplanar, multisequence MR imaging of the lumbar spine was performed. No intravenous contrast was administered. Metal artifact reduction techniques were utilized.  COMPARISON:  11/27/2015  FINDINGS: Severe metallic susceptibility artifact resulting from aortoiliac stent graft which partially obscures portions of the lumbar spine.  Segmentation:  Standard.  Alignment:  Slight lumbar dextrocurvature.  No static listhesis.  Vertebrae: The L2 through L5 vertebral bodies are largely obscured by susceptibility artifact. Within those limitations. No fracture, bone lesion, or evidence of discitis identified.  Conus medullaris and cauda equina: Conus extends to the L2 level. Conus and cauda equina appear normal.  Paraspinal and other soft tissues: Aortoiliac stent graft.  Disc levels:  T12-L1: No significant disc protrusion, foraminal stenosis, or canal stenosis.  L1-L2: Minimal disc bulge.  No foraminal or canal stenosis.  L2-L3: Evaluation of the canal on axial images obscured by susceptibility artifact. On the sagittal sequences. There is a focal right paracentral disc protrusion, enlarged from prior. At least mild-to-moderate canal stenosis. No significant foraminal stenosis evident.  L3-L4: Diffuse disc bulge and mild bilateral facet arthrosis results in mild canal stenosis without foraminal stenosis. Findings appears similar to prior.  L4-L5: Diffuse disc bulge and moderate bilateral facet arthrosis resulting in mild-to-moderate canal stenosis and mild right foraminal stenosis. Findings slightly progressed from prior.  L5-S1: Diffuse disc bulge with left paracentral protrusion and mild bilateral facet arthrosis. There is left subarticular recess stenosis. No significant foraminal or canal stenosis.  Findings similar to  prior.  IMPRESSION: 1. Examination is degraded by extensive metallic susceptibility artifact related to aortoiliac stent graft. 2. Worsened right paracentral disc protrusion at L2-3 which appears to cause at least mild-to-moderate canal stenosis, suboptimally evaluated. 3. Slight progression of degenerative disc disease at L4-5 where there is mild-to-moderate canal stenosis and mild right foraminal stenosis.   Electronically Signed   By: Davina Poke M.D.   On: 03/31/2019 17:17     Objective:  VS:  HT:    WT:   BMI:     BP:   HR: bpm  TEMP: ( )  RESP:  Physical Exam Constitutional:      General: He is not in acute distress.    Appearance: Normal appearance. He is not ill-appearing.  HENT:     Head: Normocephalic and atraumatic.     Right Ear: External ear normal.     Left Ear: External ear normal.  Eyes:     Extraocular Movements: Extraocular movements intact.  Cardiovascular:     Rate and Rhythm: Normal rate.     Pulses: Normal pulses.  Abdominal:     General: There is no distension.     Palpations: Abdomen is soft.  Musculoskeletal:        General: No tenderness or signs of injury.     Right lower leg: No edema.     Left lower leg: No edema.     Comments: Patient has good distal strength without clonus. Pain over left greater trochanter.  Skin:    Findings: No erythema or rash.  Neurological:     General: No focal deficit present.     Mental Status: He is alert and oriented to person, place, and time.     Sensory: No sensory deficit.     Motor: No weakness or abnormal muscle tone.     Coordination: Coordination normal.  Psychiatric:        Mood and Affect: Mood normal.        Behavior: Behavior normal.      Imaging: No results found.

## 2020-01-09 ENCOUNTER — Telehealth: Payer: Self-pay | Admitting: *Deleted

## 2020-01-09 MED ORDER — LIDOCAINE HCL 2 % IJ SOLN
4.0000 mL | INTRAMUSCULAR | Status: AC | PRN
Start: 2020-01-08 — End: 2020-01-08
  Administered 2020-01-08: 4 mL

## 2020-01-09 MED ORDER — TRIAMCINOLONE ACETONIDE 40 MG/ML IJ SUSP
60.0000 mg | INTRAMUSCULAR | Status: AC | PRN
  Administered 2020-01-08: 60 mg via INTRA_ARTICULAR

## 2020-01-09 MED ORDER — BUPIVACAINE HCL 0.25 % IJ SOLN
4.0000 mL | INTRAMUSCULAR | Status: AC | PRN
  Administered 2020-01-08: 4 mL via INTRA_ARTICULAR

## 2020-01-09 NOTE — Telephone Encounter (Signed)
Patient called stating that he has been having a problem with getting real hot and breaking out with a rash on his arms and legs for years when he goes outside. Patient stated that it was getting better, but now has gotten worse. Patient stated that the rash itches real bad. Patient stated that there is not any warnings on his medication bottles to avoid sunlight. Patient wants to know if Dr. Danise Mina thinks that this could be caused by any of his medications? Patient wants to know if it is okay for him to take Benadryl when this happens?

## 2020-01-10 NOTE — Telephone Encounter (Signed)
Spoke with pt relaying Dr. G's message. Pt verbalizes understanding.  

## 2020-01-10 NOTE — Telephone Encounter (Signed)
Don't see any obvious medication cause for this besides possibly flomax. Would he be willing to try off flomax for a few weeks to see effect on rash?  Ok to take benadryl when this happens.

## 2020-01-22 ENCOUNTER — Other Ambulatory Visit: Payer: Self-pay

## 2020-01-22 ENCOUNTER — Ambulatory Visit (INDEPENDENT_AMBULATORY_CARE_PROVIDER_SITE_OTHER): Payer: PPO | Admitting: Family Medicine

## 2020-01-22 ENCOUNTER — Encounter: Payer: Self-pay | Admitting: Family Medicine

## 2020-01-22 VITALS — BP 126/68 | HR 50 | Temp 98.0°F | Ht 68.0 in | Wt 194.6 lb

## 2020-01-22 DIAGNOSIS — R0982 Postnasal drip: Secondary | ICD-10-CM

## 2020-01-22 DIAGNOSIS — R21 Rash and other nonspecific skin eruption: Secondary | ICD-10-CM

## 2020-01-22 DIAGNOSIS — K225 Diverticulum of esophagus, acquired: Secondary | ICD-10-CM

## 2020-01-22 DIAGNOSIS — E663 Overweight: Secondary | ICD-10-CM

## 2020-01-22 DIAGNOSIS — G4733 Obstructive sleep apnea (adult) (pediatric): Secondary | ICD-10-CM | POA: Diagnosis not present

## 2020-01-22 DIAGNOSIS — R3911 Hesitancy of micturition: Secondary | ICD-10-CM | POA: Diagnosis not present

## 2020-01-22 DIAGNOSIS — I10 Essential (primary) hypertension: Secondary | ICD-10-CM

## 2020-01-22 DIAGNOSIS — R7303 Prediabetes: Secondary | ICD-10-CM | POA: Diagnosis not present

## 2020-01-22 DIAGNOSIS — M5116 Intervertebral disc disorders with radiculopathy, lumbar region: Secondary | ICD-10-CM | POA: Diagnosis not present

## 2020-01-22 DIAGNOSIS — N401 Enlarged prostate with lower urinary tract symptoms: Secondary | ICD-10-CM | POA: Diagnosis not present

## 2020-01-22 DIAGNOSIS — N400 Enlarged prostate without lower urinary tract symptoms: Secondary | ICD-10-CM | POA: Insufficient documentation

## 2020-01-22 DIAGNOSIS — J449 Chronic obstructive pulmonary disease, unspecified: Secondary | ICD-10-CM

## 2020-01-22 LAB — POCT GLYCOSYLATED HEMOGLOBIN (HGB A1C): Hemoglobin A1C: 6 % — AB (ref 4.0–5.6)

## 2020-01-22 NOTE — Progress Notes (Signed)
This visit was conducted in person.  BP 126/68 (BP Location: Left Arm, Patient Position: Sitting, Cuff Size: Normal)   Pulse (!) 50   Temp 98 F (36.7 C) (Temporal)   Ht 5\' 8"  (1.727 m)   Wt 194 lb 9 oz (88.3 kg)   SpO2 97%   BMI 29.58 kg/m    CC: 6 mo f/u visit  Subjective:    Patient ID: Carl Lee, male    DOB: 1956-03-21, 64 y.o.   MRN: [id number]  HPI: Carl Lee is a 64 y.o. male presenting on 01/22/2020 for Follow-up (Here for 6 mo f/u.)   Planning move to [place] - he and wife want to live closer to [employer], near sons.   Recent episodes of heat rash on arms/legs. Possibly flomax related - trialed off this with improvement. Treats with benadryl PRN. Notes worsening urinary hesitancy and urinary frequency with nocturia x1 around 4am since off flomax.   Ongoing allergy symptoms - drainage down throat, rhinorrhea, sinus congestion, worse in the mornings. Currently not on any medication for this. No h/o glaucoma. Thinks he lost sense of smell 15+ yrs ago due to flonase use.   Recently saw ortho Ernestina Patches) for trochanteric bursitis on left in h/o L hip replacement s/p steroid injection. He also recently had radiofrequency ablation R L4/5 and L5/S1 facet joints. Ongoing lower back pain with limited improvement. Upper back feels better.   Recently saw cardiology, note reviewed. Norvasc was stopped due to low blood pressures. Pulse stays 50s.   Sees pulm for moderate COPD and severe OSA on BiPAP. Some tightness with humid weather. Last used albuterol inhaler yesterday. Also on anoro elipta daily, uses duoneb every morning as well.   Planned [facility] f/u after zenker's repair (endoscopic myotomy) with some residual dysphagia.      Relevant past medical, surgical, family and social history reviewed and updated as indicated. Interim medical history since our last visit reviewed. Allergies and medications reviewed and updated. Outpatient Medications Prior to Visit  Medication  Sig Dispense Refill  . acetaminophen (TYLENOL) 325 MG tablet Take 2 tablets (650 mg total) by mouth every 6 (six) hours as needed for moderate pain or headache. 60 tablet 0  . albuterol (PROAIR HFA) 108 (90 Base) MCG/ACT inhaler Inhale 2 puffs into the lungs every 4 (four) hours as needed for wheezing or shortness of breath. 1 g 3  . ANORO ELLIPTA 62.5-25 MCG/INH AEPB TAKE 1 PUFF BY MOUTH EVERY DAY 180 each 1  . aspirin EC 81 MG tablet Take 1 tablet (81 mg total) by mouth daily. 90 tablet 1  . atorvastatin (LIPITOR) 20 MG tablet TAKE 1 TABLET (20 MG TOTAL) BY MOUTH DAILY AT 6 PM. 90 tablet 3  . cholecalciferol (VITAMIN D3) 25 MCG (1000 UT) tablet Take 1,000 Units by mouth daily.    . famotidine (PEPCID) 20 MG tablet TAKE 1 TABLET BY MOUTH TWICE A DAY 180 tablet 1  . glucose blood (ONE TOUCH ULTRA TEST) test strip CHECK ONCE DAILY AND AS NEEDED    . glucose blood test strip CHECK ONCE DAILY AND AS NEEDED    . ipratropium-albuterol (DUONEB) 0.5-2.5 (3) MG/3ML SOLN Take 3 mLs by nebulization every 6 (six) hours as needed. 360 mL 0  . ketotifen (ZADITOR) 0.025 % ophthalmic solution Place 1 drop into both eyes daily as needed (allergies). 5 mL 1  . metoprolol tartrate (LOPRESSOR) 25 MG tablet TAKE 0.5 TABLETS (12.5 MG TOTAL) BY MOUTH 2 (  TWO) TIMES DAILY. 90 tablet 3  . oxyCODONE (OXY IR/ROXICODONE) 5 MG immediate release tablet Take 5 mg by mouth every 4 (four) hours as needed.    . pantoprazole (PROTONIX) 40 MG tablet Take 1 tablet (40 mg total) by mouth daily. 90 tablet 3  . vitamin B-12 (CYANOCOBALAMIN) 500 MCG tablet Take 1 tablet (500 mcg total) by mouth daily.    . tamsulosin (FLOMAX) 0.4 MG CAPS capsule Take 1 capsule (0.4 mg total) by mouth daily after supper. (Patient not taking: Reported on 01/22/2020) 30 capsule 1   No facility-administered medications prior to visit.    Past Medical History:  Diagnosis Date  . AAA (abdominal aortic aneurysm) ([place]) 03/25/12   s/p endovascular repair  .  Adenomatous polyp 06/2009  . Atrial tachycardia, paroxysmal ([place]) 06/18/2016  . Bladder cancer ([place]) 08/16/2008   recurrence 2015 and 2018 treated with office fulguration  . CAD (coronary artery disease), native coronary artery    S/p 3v CABG 01/2014   . Cataract 2019   bilateral eyes  . Childhood asthma    as a child  . Complication of anesthesia    "takes me a year to get over"  . Contact dermatitis    atypical [name])  . Controlled diabetes mellitus type 2 with complications ([place]) 78/93/8101   CAD   . COPD (chronic obstructive pulmonary disease) ([place])   . Coronary artery disease    a.2015: CABG w/ LIMA-LAD, RIMA-OM, SVG-RCA b. Cath on 07/10/15 w/ patent LIMA-LAD and RIMA-OM. SVG-RCA occluded but collaterals present  . Diverticulosis 2013   severe by CT and colonoscopy  . Environmental allergies    improved as ages  . Fracture of lateral malleolus of left ankle 08/29/2013  . Headache    hasn't had migraines in years  . Hepatic steatosis 06/2015   by [place]  . Hx of migraines   . Hypertension   . Lactose intolerance 11/14/2018   Endorses for years - avoids dairy products  . Lumbosacral radiculopathy at S1 03/2012   left, with spinal stenosis (MRI 04/2013) improved with TF ESI L5/S1 and S1/2 ([place]ton [place])  . OSA (obstructive sleep apnea) 09/04/2014   Severe with AHI 35/hr, uses BIPAP  . Overweight (BMI 25.0-29.9) 01/26/2017  . Resistance to clopidogrel 2015   drug metabolism panel run - asked to scan  . Spinal stenosis    LS1 nerve root impingement from bulging disc  . Vitamin B12 deficiency   . Zenker diverticulum     Past Surgical History:  Procedure Laterality Date  . ABDOMINAL AORTIC ANEURYSM REPAIR  03/25/12   endovascular  . ABI  05/2013   WNL, L TBI low at 0.66  . BALLOON DILATION N/A 04/20/2019   Procedure: BALLOON DILATION;  Surgeon: [name], MD;  Location: [facility] ENDOSCOPY;  Service: Endoscopy;  Laterality: N/A;  pyloric   . BILIARY DILATION  04/20/2019    Procedure: BILIARY DILATION;  Surgeon: [name], MD;  Location: [facility] ENDOSCOPY;  Service: Endoscopy;;  . Bladder cancer  March 2010 and Oct. 2015   Ernst Spell) x 2  . CARDIAC CATHETERIZATION N/A 07/10/2015   Procedure: Left Heart Cath and Cors/Grafts Angiography;  Surgeon: [name], MD;  Location: [place] CV LAB;  Service: Cardiovascular;  Laterality: N/A;  . CATARACT EXTRACTION W/ INTRAOCULAR LENS IMPLANT Bilateral 07/2018, 08/2018  . CHOLECYSTECTOMY N/A 04/19/2019   Procedure: LAPAROSCOPIC CHOLECYSTECTOMY;  Surgeon: [name], MD;  Location: [place];  Service: General;  Laterality: N/A;  . COLONOSCOPY  06/2012   hyperplastic polyp, diverticulosis ([place]) rec rpt 5 yrs  . COLONOSCOPY WITH PROPOFOL N/A 08/10/2017   TA, rpt 5 yrs Ardis Hughs, Melene Plan, MD)  . CORONARY ARTERY BYPASS GRAFT N/A 02/07/2014   Procedure: CORONARY ARTERY BYPASS GRAFTING (CABG);  Surgeon: [name], MD;  Location: [place];  Service: Open Heart Surgery;  Laterality: N/A;  CABG X 3, BILATERAL LIMA, EVH  . CYSTOSCOPY  08/16/08   Bladder Cancer  . EPIDURAL BLOCK INJECTION Left 09/2014, 10/2014, 12/2014   medial L2,3,4, dorsal L5 ramus blocks x2, L L5/S1 and S1/2 transforaminal ESI (Dalton [place])  . ERCP N/A 04/20/2019   Procedure: ENDOSCOPIC RETROGRADE CHOLANGIOPANCREATOGRAPHY (ERCP);  Surgeon: [name], MD;  Location[facility] ENDOSCOPY;  Service: Endoscopy;  Laterality: N/A;  . ESI  04/2013, 06/2013   L L5S1, S12 transforaminal ESI (Dr.  [name])  . ESI Left 04/2014, 05/2014, 06/2014   L5/S1, S1/2; rpt; L4/5  . ESI  03/2016   R L5/S1 interlaminar ESI  . ESOPHAGEAL MANOMETRY N/A 08/10/2017   Procedure: ESOPHAGEAL MANOMETRY (EM);  Surgeon: [name], MD;  Location: [facility] ENDOSCOPY;  Service: Endoscopy;  Laterality: N/A;  . ESOPHAGOGASTRODUODENOSCOPY (EGD) WITH PROPOFOL N/A 08/10/2017   Procedure: ESOPHAGOGASTRODUODENOSCOPY (EGD) WITH PROPOFOL;  Surgeon: [name], MD;  Location: [facility]  ENDOSCOPY;  Service: Endoscopy;  Laterality: N/A;  . FULGURATION OF BLADDER TUMOR  01/2017   recurrent 23mm L lateral wall papillary transitional cell carcinoma ([place])  . INTRAOPERATIVE CHOLANGIOGRAM N/A 04/19/2019   Procedure: Intraoperative Cholangiogram;  Surgeon: [name], MD;  Location: [place];  Service: General;  Laterality: N/A;  . INTRAOPERATIVE TRANSESOPHAGEAL ECHOCARDIOGRAM N/A 02/07/2014   Procedure: INTRAOPERATIVE TRANSESOPHAGEAL ECHOCARDIOGRAM;  Surgeon: [name], MD;  Location: [place];  Service: Open Heart Surgery;  Laterality: N/A;  . LEFT HEART CATH AND CORS/GRAFTS ANGIOGRAPHY N/A 02/11/2017   Procedure: LEFT HEART CATH AND CORS/GRAFTS ANGIOGRAPHY;  Surgeon: [name], MD;  Location: [place] CV LAB;  Service: Cardiovascular;  Laterality: N/A;  . LEFT HEART CATH AND CORS/GRAFTS ANGIOGRAPHY N/A 02/02/2019   Procedure: LEFT HEART CATH AND CORS/GRAFTS ANGIOGRAPHY;  Surgeon: Belva Crome, MD;  Location: [place] CV LAB;  Service: Cardiovascular;  Laterality: N/A;  . LEFT HEART CATHETERIZATION WITH CORONARY ANGIOGRAM N/A 02/01/2014   Procedure: LEFT HEART CATHETERIZATION WITH CORONARY ANGIOGRAM;  Surgeon: [name], MD;  Location: [facility] C[facility] LAB;  Service: Cardiovascular;  Laterality: N/A;  . peroral endoscopic myotomy  06/2019   EGD - treated zenker's diverticulum, patent shartzki ring, irregular Z line, small HH (Dr Harl Bowie at [facility])  . PRP epidural injection Left 11/2015   L5/S1, S1/2 transforaminal epidural PRP injections under fluoroscopy ([place])  . REMOVAL OF STONES  04/20/2019   Procedure: REMOVAL OF STONES;  Surgeon: [name], MD;  Location: [facility] ENDOSCOPY;  Service: Endoscopy;;  . REPLACEMENT TOTAL HIP W/  RESURFACING IMPLANTS Left   . SPHINCTEROTOMY  04/20/2019   Procedure: SPHINCTEROTOMY;  Surgeon: [name], MD;  Location: [facility] ENDOSCOPY;  Service: Endoscopy;;  . [place]  . TOTAL HIP ARTHROPLASTY  Left 03/08/2017  . TOTAL HIP ARTHROPLASTY Left 03/08/2017   Procedure: LEFT TOTAL HIP ARTHROPLASTY ANTERIOR APPROACH;  Surgeon: [name], MD;  Location: [place];  Service: Orthopedics;  Laterality: Left;  [name] VASECTOMY    . ZENKER'S DIVERTICULECTOMY  09/2017  . ZENKER'S DIVERTICULECTOMY ENDOSCOPIC  06/2019   [facility] [place] History   Tobacco Use  .  Smoking status: Former Smoker    Packs/day: 1.00    Years: 44.00    Pack years: 44.00    Types: Cigarettes    Start date: 53    Quit date: 02/05/2014    Years since quitting: 5.9  . Smokeless tobacco: Never Used  Vaping Use  . Vaping Use: Never used  Substance Use Topics  . Alcohol use: Yes    Alcohol/week: 0.0 standard drinks    Comment: 3-4 drinks per month  . Drug use: No    Per HPI unless specifically indicated in ROS section below Review of Systems Objective:  BP 126/68 (BP Location: Left Arm, Patient Position: Sitting, Cuff Size: Normal)   Pulse (!) 50   Temp 98 F (36.7 C) (Temporal)   Ht 5\' 8"  (1.727 m)   Wt 194 lb 9 oz (88.3 kg)   SpO2 97%   BMI 29.58 kg/m   Wt Readings from Last 3 Encounters:  01/22/20 194 lb 9 oz (88.3 kg)  12/12/19 199 lb (90.3 kg)  10/30/19 200 lb (90.7 kg)      Physical Exam Vitals and nursing note reviewed.  Constitutional:      Appearance: Normal appearance. He is not ill-appearing.  Eyes:     Extraocular Movements: Extraocular movements intact.     Pupils: Pupils are equal, round, and reactive to light.  Neck:     Thyroid: No thyromegaly or thyroid tenderness.  Cardiovascular:     Rate and Rhythm: Normal rate and regular rhythm.     Pulses: Normal pulses.     Heart sounds: Normal heart sounds. No murmur heard.   Pulmonary:     Effort: Pulmonary effort is normal. No respiratory distress.     Breath sounds: Normal breath sounds. No wheezing, rhonchi or rales.  Musculoskeletal:     Right lower leg: No edema.     Left lower leg: No edema.  Neurological:      Mental Status: He is alert.  Psychiatric:        Mood and Affect: Mood normal.        Behavior: Behavior normal.       Results for orders placed or performed in visit on 01/22/20  POCT glycosylated hemoglobin (Hb A1C)  Result Value Ref Range   Hemoglobin A1C 6.0 (A) 4.0 - 5.6 %   HbA1c POC (<> result, manual entry)     HbA1c, POC (prediabetic range)     HbA1c, POC (controlled diabetic range)     Lab Results  Component Value Date   PSA 0.93 07/17/2019   PSA 1.18 07/13/2018   PSA 0.93 06/29/2017   Assessment & Plan:  This visit occurred during the SARS-CoV-2 public health emergency.  Safety protocols were in place, including screening questions prior to the visit, additional usage of staff PPE, and extensive cleaning of exam room while observing appropriate contact time as indicated for disinfecting solutions.   Problem List Items Addressed This Visit    Zenker diverticulum    S/p endoscopic diverticulotomy by [facility]. Appreciate GI care - continue f/u with them      Skin rash - Primary    Possible photosensitivity due to flomax - improved off this medication. He may retrial in the future to see if similar event. Will touch base with urology about this vs other treatment options for BPH.       Radiculopathy due to lumbar intervertebral disc disorder    Appreciate PM&R care Ernestina Patches) - recent RFA R lumbosacral  facet joints      Prediabetes    Chronic, diet controlled - levels remain in prediabetes range over the past year - will change dx. Has previously declined diabetes education classes.       Relevant Orders   POCT glycosylated hemoglobin (Hb A1C) (Completed)   PND (post-nasal drip)    Presume allergic related - rec restart flonase + antihistamine regularly. No h/o glaucoma.       Overweight (BMI 25.0-29.9)    Weight loss noted - BMI now below 30.       OSA (obstructive sleep apnea)    Continue BiPAP.       Hypertension    Chronic. Amlodipine recently stopped.  BP stable on current regimen.       COPD, moderate ([place])    Appreciate pulm care. Continue anoro ellipta with PRN albuterol.       BPH (benign prostatic hyperplasia)    Doing better regarding skin rash off flomax. Will stay off at this time, may consider re-trial to see if it causes rash again.           No orders of the defined types were placed in this encounter.  Orders Placed This Encounter  Procedures  . POCT glycosylated hemoglobin (Hb A1C)    Patient Instructions  Ok to stay off flomax at this time, consider re-trial in the future. Let us know if worsening urinary symptoms.  May try flonase + antihistamine like claritin or allegra for allergy symptoms. Avoid medications with decongestant "D" component  Good to see you today, call us with questions.  I hope you have a safe move! Let us know who you would like [place] to send records to. It's been a pleasure to care for you!    Follow up plan: Return if symptoms worsen or fail to improve.  [name], MD

## 2020-01-22 NOTE — Patient Instructions (Addendum)
Ok to stay off flomax at this time, consider re-trial in the future. Let us know if worsening urinary symptoms.  May try flonase + antihistamine like claritin or allegra for allergy symptoms. Avoid medications with decongestant "D" component  Good to see you today, call us with questions.  I hope you have a safe move! Let us know who you would like [place] to send records to. It's been a pleasure to care for you!

## 2020-01-22 NOTE — Assessment & Plan Note (Signed)
Doing better regarding skin rash off flomax. Will stay off at this time, may consider re-trial to see if it causes rash again.

## 2020-01-23 DIAGNOSIS — R0982 Postnasal drip: Secondary | ICD-10-CM | POA: Insufficient documentation

## 2020-01-23 NOTE — Assessment & Plan Note (Addendum)
Chronic, diet controlled - levels remain in prediabetes range over the past year - will change dx. Has previously declined diabetes education classes.

## 2020-01-23 NOTE — Assessment & Plan Note (Signed)
Possible photosensitivity due to flomax - improved off this medication. He may retrial in the future to see if similar event. Will touch base with urology about this vs other treatment options for BPH.

## 2020-01-23 NOTE — Assessment & Plan Note (Addendum)
Presume allergic related - rec restart flonase + antihistamine regularly. No h/o glaucoma.

## 2020-01-23 NOTE — Assessment & Plan Note (Signed)
Appreciate pulm care. Continue anoro ellipta with PRN albuterol.

## 2020-01-23 NOTE — Assessment & Plan Note (Signed)
Chronic. Amlodipine recently stopped. BP stable on current regimen.

## 2020-01-23 NOTE — Assessment & Plan Note (Signed)
S/p endoscopic diverticulotomy by [facility]. Appreciate GI care - continue f/u with them

## 2020-01-23 NOTE — Assessment & Plan Note (Addendum)
Weight loss noted - BMI now below 30.

## 2020-01-23 NOTE — Assessment & Plan Note (Signed)
Appreciate PM&R care Carl Lee) - recent RFA R lumbosacral facet joints

## 2020-01-23 NOTE — Assessment & Plan Note (Addendum)
Continue BiPAP.  

## 2020-02-11 ENCOUNTER — Other Ambulatory Visit: Payer: Self-pay

## 2020-02-11 ENCOUNTER — Ambulatory Visit: Payer: PPO

## 2020-02-11 DIAGNOSIS — E785 Hyperlipidemia, unspecified: Secondary | ICD-10-CM

## 2020-02-11 DIAGNOSIS — I10 Essential (primary) hypertension: Secondary | ICD-10-CM

## 2020-02-11 DIAGNOSIS — E1169 Type 2 diabetes mellitus with other specified complication: Secondary | ICD-10-CM

## 2020-02-11 NOTE — Patient Instructions (Addendum)
Dear Carl Lee,  Below is a summary of the goals we discussed during our follow up appointment on February 11, 2020. Please contact me anytime with questions or concerns.   Visit Information  Goals Addressed            This Visit's Progress   . Pharmacy Care Plan       CARE PLAN ENTRY (see longitudinal plan of care for additional care plan information)  Current Barriers:  . Chronic Disease Management support, education, and care coordination needs related to Hypertension and Hyperlipidemia   Hypertension BP Readings from Last 3 Encounters:  01/22/20 126/68  12/24/19 114/78  12/12/19 112/60  Home blood pressure readings: 113/82 mmHg, 120/78 mmHg, 99/67 mmHg, 110/70 mmHg, 108/70, 140/92 . Pharmacist Clinical Goal(s): o Over the next 6 months, patient will work with PharmD and providers to maintain BP goal <140/90 mmHg . Current regimen:  o Metoprolol tartrate 25 mg - take one-half tablet twice daily . Interventions: o Reviewed home blood pressure monitoring  . Patient self care activities - Over the next 6 months, patient will: o Check BP daily, document, and provide at future appointments; Recheck after 5 minutes if elevated.  o Call if fatigue/drowsiness worsens or blood pressure < 90/60 mmHg  Hyperlipidemia Lab Results  Component Value Date/Time   LDLCALC 36 07/17/2019 08:29 AM   LDLDIRECT 62.0 12/28/2017 08:27 AM .  Pharmacist Clinical Goal(s): o Over the next 6 months, patient will work with PharmD and providers to maintain LDL goal < 70 . Current regimen:  o Atorvastatin 20 mg - 1 tablet daily . Interventions: o Reviewed adherence, refills timely, no patient concerns . Patient self care activities - Over the next 6 months, patient will: o Continue medication as prescribed o Slowly increase exercise with goal of 30 minutes, 5 days per week o Incorporate a healthy diet high in vegetables, fruits and whole grains with low-fat dairy products, chicken, fish, legumes,  non-tropical vegetable oils and nuts. Limit intake of sweets, sugar-sweetened beverages and red meats.  Please see past updates related to this goal by clicking on the "Past Updates" button in the selected goal       The patient verbalized understanding of instructions provided today and agreed to receive a mailed copy of patient instruction and/or educational materials.  Telephone follow up appointment with pharmacy team member scheduled for: August 13, 2020 at 9:30 AM (telephone)  Debbora Dus, PharmD Clinical Pharmacist [facility] [phone]  Hypotension As your heart beats, it forces blood through your body. This force is called blood pressure. If you have hypotension, you have low blood pressure. When your blood pressure is too low, you may not get enough blood to your brain or other parts of your body. This may cause you to feel weak, light-headed, have a fast heartbeat, or even pass out (faint). Low blood pressure may be harmless, or it may cause serious problems. What are the causes?  Blood loss.  Not enough water in the body (dehydration).  Heart problems.  Hormone problems.  Pregnancy.  A very bad infection.  Not having enough of certain nutrients.  Very bad allergic reactions.  Certain medicines. What increases the risk?  Age. The risk increases as you get older.  Conditions that affect the heart or the brain and spinal cord (central nervous system).  Taking certain medicines.  Being pregnant. What are the signs or symptoms?  Feeling: ? Weak. ? Light-headed. ? Dizzy. ? Tired (  fatigued).  Blurred vision.  Fast heartbeat.  Passing out, in very bad cases. How is this treated?  Changing your diet. This may involve eating more salt (sodium) or drinking more water.  Taking medicines to raise your blood pressure.  Changing how much you take (the dosage) of some of your medicines.  Wearing compression stockings. These  stockings help to prevent blood clots and reduce swelling in your legs. In some cases, you may need to go to the hospital for:  Fluid replacement. This means you will receive fluids through an IV tube.  Blood replacement. This means you will receive donated blood through an IV tube (transfusion).  Treating an infection or heart problems, if this applies.  Monitoring. You may need to be monitored while medicines that you are taking wear off. Follow these instructions at home: Eating and drinking   Drink enough fluids to keep your pee (urine) pale yellow.  Eat a healthy diet. Follow instructions from your doctor about what you can eat or drink. A healthy diet includes: ? Fresh fruits and vegetables. ? Whole grains. ? Low-fat (lean) meats. ? Low-fat dairy products.  Eat extra salt only as told. Do not add extra salt to your diet unless your doctor tells you to.  Eat small meals often.  Avoid standing up quickly after you eat. Medicines  Take over-the-counter and prescription medicines only as told by your doctor. ? Follow instructions from your doctor about changing how much you take of your medicines, if this applies. ? Do not stop or change any of your medicines on your own. General instructions   Wear compression stockings as told by your doctor.  Get up slowly from lying down or sitting.  Avoid hot showers and a lot of heat as told by your doctor.  Return to your normal activities as told by your doctor. Ask what activities are safe for you.  Do not use any products that contain nicotine or tobacco, such as cigarettes, e-cigarettes, and chewing tobacco. If you need help quitting, ask your doctor.  Keep all follow-up visits as told by your doctor. This is important. Contact a doctor if:  You throw up (vomit).  You have watery poop (diarrhea).  You have a fever for more than 2-3 days.  You feel more thirsty than normal.  You feel weak and tired. Get help right  away if:  You have chest pain.  You have a fast or uneven heartbeat.  You lose feeling (have numbness) in any part of your body.  You cannot move your arms or your legs.  You have trouble talking.  You get sweaty or feel light-headed.  You pass out.  You have trouble breathing.  You have trouble staying awake.  You feel mixed up (confused). Summary  Hypotension is also called low blood pressure. It is when the force of blood pumping through your arteries is too weak.  Hypotension may be harmless, or it may cause serious problems.  Treatment may include changing your diet and medicines, and wearing compression stockings.  In very bad cases, you may need to go to the hospital. This information is not intended to replace advice given to you by your health care provider. Make sure you discuss any questions you have with your health care provider. Document Revised: 12/22/2017 Document Reviewed: 12/22/2017 Elsevier Patient Education  [place].

## 2020-02-11 NOTE — Chronic Care Management (AMB) (Signed)
Chronic Care Management Pharmacy  Name: Carl Lee  MRN: [id number] DOB: 1955-11-13  Chief Complaint/ HPI  Carl Lee,  64 y.o., male presents for their Follow-Up CCM visit with the clinical pharmacist via telephone.  PCP : [name], MD   Their chronic conditions include: HTN, HLD, CAD, COPD, OA, OAS, GERD, DM  Last CCM visit 08/27/19: no medication changes  Patient concerns: Reports still having problem breaking out in hives on legs after sun exposure. Stopped tamsulosin 7/13 due to possible rash per PCP recommendation. Pt doesn't think it's tamsulosin related as it hasn't improved since discontinuing. Has applied sunscreen and no difference. Recommend following up with dermatology. May resume tamsulosin.  Office Visits:  01/22/20: Danise Mina, skin rash - Recent episodes of heat rash on arms/legs. Possibly flomax related - trialed off this with improvement. Treats with benadryl PRN. Notes worsening urinary hesitancy and urinary frequency with nocturia x1 around 4am since off flomax. May retrial Flomax, touch base with urology about this versus other treatment options for BPH; post-nasal drip - resume flonase and antihistamine  07/23/19: Danise Mina, AWV - increased B12 to daily  Consult Visit:  12/12/19: Cardiology - HTN, BP running low d/c norvasc, LDL at goal on statin, OSA on CPAP, CAD medical management   08/07/19: Warner Mccreedy Brain, pulmonology - patient did not meet criteria for supplemental oxygen, continue Anoro, offered PPSV23, maintain oxygen sats at 88%  08/01/19: Chi Rodman Pickle, pulmonology - ordered 6 min walk test, continue current meds  07/11/19: GI - doing well post procedure for Zenker's, much less coughing, throat red/sore  Allergies  Allergen Reactions  . Codeine Other (See Comments)    Nightmares  . Doxycycline Hives and Other (See Comments)    mild   Medications: Outpatient Encounter Medications as of 02/11/2020  Medication Sig  . acetaminophen (TYLENOL)  325 MG tablet Take 2 tablets (650 mg total) by mouth every 6 (six) hours as needed for moderate pain or headache.  . albuterol (PROAIR HFA) 108 (90 Base) MCG/ACT inhaler Inhale 2 puffs into the lungs every 4 (four) hours as needed for wheezing or shortness of breath.  Jearl Klinefelter ELLIPTA 62.5-25 MCG/INH AEPB TAKE 1 PUFF BY MOUTH EVERY DAY  . aspirin EC 81 MG tablet Take 1 tablet (81 mg total) by mouth daily.  [name] atorvastatin (LIPITOR) 20 MG tablet TAKE 1 TABLET (20 MG TOTAL) BY MOUTH DAILY AT 6 PM.  . cholecalciferol (VITAMIN D3) 25 MCG (1000 UT) tablet Take 1,000 Units by mouth daily.  . famotidine (PEPCID) 20 MG tablet TAKE 1 TABLET BY MOUTH TWICE A DAY  . glucose blood (ONE TOUCH ULTRA TEST) test strip CHECK ONCE DAILY AND AS NEEDED  . glucose blood test strip CHECK ONCE DAILY AND AS NEEDED  . ipratropium-albuterol (DUONEB) 0.5-2.5 (3) MG/3ML SOLN Take 3 mLs by nebulization every 6 (six) hours as needed.  [name] ketotifen (ZADITOR) 0.025 % ophthalmic solution Place 1 drop into both eyes daily as needed (allergies).  . metoprolol tartrate (LOPRESSOR) 25 MG tablet TAKE 0.5 TABLETS (12.5 MG TOTAL) BY MOUTH 2 (TWO) TIMES DAILY.  [name] oxyCODONE (OXY IR/ROXICODONE) 5 MG immediate release tablet Take 5 mg by mouth every 4 (four) hours as needed.  . pantoprazole (PROTONIX) 40 MG tablet Take 1 tablet (40 mg total) by mouth daily.  . vitamin B-12 (CYANOCOBALAMIN) 500 MCG tablet Take 1 tablet (500 mcg total) by mouth daily.   No facility-administered encounter medications on file as of 02/11/2020.   Current Diagnosis/Assessment:  SDOH Interventions     Most Recent Value  SDOH Interventions  Financial Strain Interventions Intervention Not Indicated     Goals    . Increase physical activity     Starting 07/13/18, I will continue to walk 10 minutes daily.      [name] Pharmacy Care Plan     CARE PLAN ENTRY (see longitudinal plan of care for additional care plan information)  Current Barriers:  . Chronic Disease  Management support, education, and care coordination needs related to Hypertension and Hyperlipidemia   Hypertension BP Readings from Last 3 Encounters:  01/22/20 126/68  12/24/19 114/78  12/12/19 112/60  Home blood pressure readings: 113/82 mmHg, 120/78 mmHg, 99/67 mmHg, 110/70 mmHg, 108/70, 140/92 . Pharmacist Clinical Goal(s): o Over the next 6 months, patient will work with PharmD and providers to maintain BP goal <140/90 mmHg . Current regimen:  o Metoprolol tartrate 25 mg - take one-half tablet twice daily . Interventions: o Reviewed home blood pressure monitoring  . Patient self care activities - Over the next 6 months, patient will: o Check BP daily, document, and provide at future appointments; Recheck after 5 minutes if elevated.  o Call if fatigue/drowsiness worsens or blood pressure < 90/60 mmHg  Hyperlipidemia Lab Results  Component Value Date/Time   LDLCALC 36 07/17/2019 08:29 AM   LDLDIRECT 62.0 12/28/2017 08:27 AM .  Pharmacist Clinical Goal(s): o Over the next 6 months, patient will work with PharmD and providers to maintain LDL goal < 70 . Current regimen:  o Atorvastatin 20 mg - 1 tablet daily . Interventions: o Reviewed adherence, refills timely, no patient concerns . Patient self care activities - Over the next 6 months, patient will: o Continue medication as prescribed o Slowly increase exercise with goal of 30 minutes, 5 days per week o Incorporate a healthy diet high in vegetables, fruits and whole grains with low-fat dairy products, chicken, fish, legumes, non-tropical vegetable oils and nuts. Limit intake of sweets, sugar-sweetened beverages and red meats.  Please see past updates related to this goal by clicking on the "Past Updates" button in the selected goal       Hypertension   Office blood pressures are  BP Readings from Last 3 Encounters:  01/22/20 126/68  12/24/19 114/78  12/12/19 112/60   CMP Latest Ref Rng & Units 07/17/2019 05/01/2019  04/21/2019  Glucose 70 - 99 mg/dL 113(H) 108(H) 138(H)  BUN 6 - 23 mg/dL 13 16 11   Creatinine 0.40 - 1.50 mg/dL 0.74 0.69 0.71  Sodium 135 - 145 mEq/L 139 139 136  Potassium 3.5 - 5.1 mEq/L 4.4 4.6 3.7  Chloride 96 - 112 mEq/L 102 102 96(L)  CO2 19 - 32 mEq/L 28 30 28   Calcium 8.4 - 10.5 mg/dL 9.4 9.5 8.7(L)  Total Protein 6.0 - 8.3 g/dL 6.2 6.5 6.1(L)  Total Bilirubin 0.2 - 1.2 mg/dL 1.1 1.1 0.9  Alkaline Phos 39 - 117 U/L 92 113 189(H)  AST 0 - 37 U/L 13 14 80(H)  ALT 0 - 53 U/L 8 10 73(H)   Goal: < 140/90 mmHg Patient has tried these meds in the past: lisinopril, nebivolol, amlodipine  Patient checks BP at home daily Patient home BP readings are ranging: 113/82 mmHg, 120/78 mmHg, 99/67 mmHg, 110/70 mmHg, 108/70, 140/92 Heart rate: 66, 62, 83, 74, 76, 67   Patient is currently controlled on the following medications:   Metoprolol tartrate 25 mg - one-half tablet twice daily  We discussed: Cardiologist stopped amlodipine  12/12/19 due to hypotension. Reports he is still having some lows but not as many. Feels tired when BP is low, between 90-100/60-65 mmHg. Last occurred 2 days ago. Checking BP most mornings. Denies concern about hypotension or interest in further medication adjustment today.   Plan: Continue current medications  Hyperlipidemia/CAD   CBC Latest Ref Rng & Units 07/17/2019 05/01/2019 04/21/2019  WBC 4.0 - 10.5 K/uL 9.3 10.3 10.6(H)  Hemoglobin 13.0 - 17.0 g/dL 16.7 15.7 14.4  Hematocrit 39 - 52 % 49.3 47.2 41.5  Platelets 150 - 400 K/uL 237.0 439.0(H) 256   Lipid Panel     Component Value Date/Time   CHOL 97 07/17/2019 0829   CHOL 170 01/05/2012 0000   TRIG 117.0 07/17/2019 0829   TRIG 141 01/05/2012 0000   HDL 37.50 (L) 07/17/2019 0829   CHOLHDL 3 07/17/2019 0829   VLDL 23.4 07/17/2019 0829   LDLCALC 36 07/17/2019 0829   LDLDIRECT 62.0 12/28/2017 0827    ASCVD: CABG x 3, CAD LDL goal < 70 Patient has failed these meds in past: none Patient is currently  controlled on the following medications:   Aspirin 81 mg - once daily  Atorvastatin 20 mg - once daily  Adherence: refills timely, denies adverse effects Avoids NSAIDs with aspirin, CBC stable   Plan: Continue current medications  GERD   Patient has failed these meds in past: omeprazole (changed to pantoprazole Oct 2020 during hospital stay) Patient is currently controlled on the following medications:   Famotidine 20 mg - one tablet twice daily   Pantoprazole 40 mg - 1 tablet daily  We discussed: reports taking both, doing well; had gallbladder removed 3-4 months ago, still adjusting diet   Plan: Continue current medications  Urinary Frequency    Frequency: 10 or more times daily, only once at night  Patient has tried these meds in past: Tamsulosin 0.4 mg cap - 2 caps at bedtime Patient is currently uncontrolled on the following medications:   No pharmacotherapy   We discussed: Currently off tamsulosin for the past 2 weeks to see if rash would improve. Rash has not resolved. Pt plans to discuss with urologist before resuming therapy.   Plan: Patient plans to discuss with urology.   Medication Management  OTCs: vitamin D 1000 IU daily, vitamin B12 500 mcg daily, Ketotifen (Zaditor) ophthalmic solution - 1 drop both eyes PRN dry eyes   Pharmacy: [facility]   Adherence: pill box  Social support: wife  Affordability: denies concerns   CCM Follow Up: 6 months, telephone   Debbora Dus, PharmD Clinical Pharmacist [facility] at [facility] [phone]

## 2020-02-12 NOTE — Progress Notes (Signed)
I have collaborated with the care management provider regarding care management and care coordination activities outlined in this encounter and have reviewed this encounter including documentation in the note and care plan. I am certifying that I agree with the content of this note and encounter as supervising physician.  

## 2020-02-13 DIAGNOSIS — J449 Chronic obstructive pulmonary disease, unspecified: Secondary | ICD-10-CM | POA: Diagnosis not present

## 2020-02-13 DIAGNOSIS — G4733 Obstructive sleep apnea (adult) (pediatric): Secondary | ICD-10-CM | POA: Diagnosis not present

## 2020-03-13 ENCOUNTER — Ambulatory Visit: Payer: PPO | Admitting: Pulmonary Disease

## 2020-03-13 ENCOUNTER — Telehealth: Payer: Self-pay | Admitting: Pulmonary Disease

## 2020-03-13 ENCOUNTER — Encounter: Payer: Self-pay | Admitting: Pulmonary Disease

## 2020-03-13 ENCOUNTER — Encounter: Payer: Self-pay | Admitting: Family Medicine

## 2020-03-13 ENCOUNTER — Other Ambulatory Visit: Payer: Self-pay

## 2020-03-13 VITALS — BP 128/86 | HR 70 | Temp 97.9°F | Ht 68.0 in | Wt 194.6 lb

## 2020-03-13 DIAGNOSIS — J449 Chronic obstructive pulmonary disease, unspecified: Secondary | ICD-10-CM | POA: Diagnosis not present

## 2020-03-13 DIAGNOSIS — R0602 Shortness of breath: Secondary | ICD-10-CM | POA: Diagnosis not present

## 2020-03-13 DIAGNOSIS — R05 Cough: Secondary | ICD-10-CM

## 2020-03-13 DIAGNOSIS — Z1152 Encounter for screening for COVID-19: Secondary | ICD-10-CM | POA: Diagnosis not present

## 2020-03-13 DIAGNOSIS — I10 Essential (primary) hypertension: Secondary | ICD-10-CM | POA: Diagnosis not present

## 2020-03-13 DIAGNOSIS — Z03818 Encounter for observation for suspected exposure to other biological agents ruled out: Secondary | ICD-10-CM | POA: Diagnosis not present

## 2020-03-13 DIAGNOSIS — R059 Cough, unspecified: Secondary | ICD-10-CM

## 2020-03-13 MED ORDER — PREDNISONE 10 MG PO TABS: ORAL_TABLET | ORAL | 0 refills | Status: DC

## 2020-03-13 MED ORDER — AZITHROMYCIN 250 MG PO TABS: ORAL_TABLET | ORAL | 0 refills | Status: DC

## 2020-03-13 NOTE — Telephone Encounter (Signed)
error 

## 2020-03-13 NOTE — Progress Notes (Signed)
@Patient  ID: Carl Lee, male    DOB: 07-17-1955, 64 y.o.   MRN: [id number]  Chief Complaint  Patient presents with  . Follow-up    pt copd flare up having breathing issues.pt states blood presuure was going up and down    Referring provider: [name], MD  HPI:  64 year old male former smoker followed in our office for COPD  PMH: Hypertension, history of AAA, arthritis, status post CABG x3, hyperlipidemia, atrial tachycardia, obesity, cataracts bilaterally, GERD Smoker/ Smoking History: Former smoker Maintenance: Anoro Ellipta Pt of: Dr. Loanne Drilling  03/13/2020  - Visit   63 year old male former smoker followed in our office for COPD.  Presented to our office on accident today as he was scheduled for an acute visit on 03/14/2020.  Patient contacted our office earlier this week reporting worsening shortness of breath.  Patient believes that symptoms started on 03/09/2020.  Patient denies fevers.  He has a faint increase shortness of breath, cough, increased sinus drainage.  He is unvaccinated with a COVID-19 virus.  Patient reports that he "does not feel that he he has Covid".  He has not obtain testing due to acute worsening symptoms.  His blood pressure is also elevated.  He has not followed up with primary care regarding this.  He reports adherence to his inhalers.  She has been keeping track of his blood pressure records as well as his oxygen levels.  Oxygen levels are slowly decreasing.  Oxygen levels are now ranging between 91 and 94 percent on room air.  Prior to symptom onset oxygen levels were 95 to 96 percent.   Tests:   07/17/2019-CBC with differential-eosinophils relative 5.6, eosinophils absolute 0.5  07/20/2019-CT chest lung cancer screening-lung RADS 2, benign appearance or behavior, continue annual screening with low-dose CT, centrilobular and paraseptal emphysema evident  01/31/2019-echocardiogram-LV ejection fraction-60 to 65%, severe hypokinesis of left ventricle,  right ventricle is normal systolic function   FENO:  No results found for: NITRICOXIDE  PFT: PFT Results Latest Ref Rng & Units 11/03/2015 02/06/2014  FVC-Pre L 3.05 3.34  FVC-Predicted Pre % 64 67  FVC-Post L 3.04 3.32  FVC-Predicted Post % 64 67  Pre FEV1/FVC % % 63 62  Post FEV1/FCV % % 62 63  FEV1-Pre L 1.92 2.08  FEV1-Predicted Pre % 54 55  FEV1-Post L 1.88 2.10  DLCO uncorrected ml/min/mmHg 12.77 -  DLCO UNC% % 39 -  DLVA Predicted % 49 -  TLC L 6.94 6.35  TLC % Predicted % 99 88  RV % Predicted % 155 120    WALK:  SIX MIN WALK 08/07/2019 08/07/2019  Supplimental Oxygen during Test? (L/min) No No  Laps - 11  Partial Lap (in Meters) - 0  Baseline BP (sitting) - 124/64  Baseline Heartrate - 55  Baseline Dyspnea (Borg Scale) - 3  Baseline Fatigue (Borg Scale) - 4  Baseline SPO2 - 97  BP (sitting) - 152/80  Heartrate - 90  Dyspnea (Borg Scale) - 3  Fatigue (Borg Scale) - 4  SPO2 - 83  BP (sitting) - 163/62  Heartrate - 59  SPO2 - 97  Stopped or Paused before Six Minutes - No  Interpretation - Hip pain  Distance Completed - 374  Tech Comments: moderate pace walk, sats did not drop below 88%, recovered quickly to 94% after walk, no SOB or other symptoms Pt did desat after 6 mins to 83% but recovered at 97% after 2 mins. TA/CMA    Imaging: No results  found.  Lab Results:  CBC    Component Value Date/Time   WBC 9.3 07/17/2019 0829   RBC 5.31 07/17/2019 0829   HGB 16.7 07/17/2019 0829   HGB 16.7 01/31/2019 0940   HCT 49.3 07/17/2019 0829   HCT 47.5 01/31/2019 0940   PLT 237.0 07/17/2019 0829   PLT 255 01/31/2019 0940   MCV 93.0 07/17/2019 0829   MCV 93 01/31/2019 0940   MCV 96 01/09/2014 1237   MCH 32.3 04/21/2019 0349   MCHC 33.8 07/17/2019 0829   RDW 14.8 07/17/2019 0829   RDW 12.6 01/31/2019 0940   RDW 13.5 01/09/2014 1237   LYMPHSABS 2.9 07/17/2019 0829   LYMPHSABS 2.8 01/31/2019 0940   MONOABS 0.8 07/17/2019 0829   EOSABS 0.5 07/17/2019 0829     EOSABS 0.1 01/31/2019 0940   BASOSABS 0.1 07/17/2019 0829   BASOSABS 0.1 01/31/2019 0940    BMET    Component Value Date/Time   NA 139 07/17/2019 0829   NA 142 01/31/2019 0940   NA 138 01/09/2014 1237   K 4.4 07/17/2019 0829   K 3.7 01/09/2014 1237   CL 102 07/17/2019 0829   CL 104 01/09/2014 1237   CO2 28 07/17/2019 0829   CO2 27 01/09/2014 1237   GLUCOSE 113 (H) 07/17/2019 0829   GLUCOSE 83 01/09/2014 1237   BUN 13 07/17/2019 0829   BUN 15 01/31/2019 0940   BUN 19 (H) 01/09/2014 1237   CREATININE 0.74 07/17/2019 0829   CREATININE 0.82 11/10/2018 1602   CALCIUM 9.4 07/17/2019 0829   CALCIUM 8.6 01/09/2014 1237   GFRNONAA >60 04/21/2019 0349   GFRNONAA >60 01/09/2014 1237   GFRAA >60 04/21/2019 0349   GFRAA >60 01/09/2014 1237    BNP    Component Value Date/Time   BNP 87 11/10/2018 1602    ProBNP No results found for: PROBNP  Specialty Problems      Pulmonary Problems   COPD, moderate ([place])    July 2015 pulmonary function testing> ratio 62%, FEV1 2.10 L (56% predicted), total lung capacity 6.35 L (80% predicted) April 2017 pulmonary function testing: FEV1 1.8 L, total lung capacity 99% predicted, DLCO 39% predicted 11/12/2015 TTE > normal LVEF, RV mildly dilated and systolic function mildly reduced By CT 07/2018 and 07/2019 mild centrilobular and paraseptal emphysema       Zenker diverticulum    Esophageal manometry WNL 07/2017 S/p diverticulotomy [facility]key Dupre at [facility]) 08/2017 Recurrent and enlarging 12/2018 seeing [facility] Wilburn Cornelia) referred to [facility] Dr Harl Bowie  S/p endoscopic diverticulotomy [facility]) 06/2019      OSA (obstructive sleep apnea)    Severe with AHI 35/hr      Dyspnea   Sore throat   Cough      Allergies  Allergen Reactions  . Codeine Other (See Comments)    Nightmares  . Doxycycline Hives and Other (See Comments)    mild    Immunization History  Administered Date(s) Administered  . Influenza,inj,Quad PF,6+ Mos 04/17/2013,  06/17/2014, 05/01/2015, 03/25/2016, 04/01/2017, 07/14/2018, 03/22/2019  . Pneumococcal Conjugate-13 05/01/2015  . Pneumococcal Polysaccharide-23 06/17/2014, 08/07/2019  . Tdap 12/14/2012  . Zoster 12/23/2015    Past Medical History:  Diagnosis Date  . AAA (abdominal aortic aneurysm) ([place]) 03/25/12   s/p endovascular repair  . Adenomatous polyp 06/2009  . Atrial tachycardia, paroxysmal ([place]) 06/18/2016  . Bladder cancer ([place]) 08/16/2008   recurrence 2015 and 2018 treated with office fulguration  . CAD (coronary artery disease), native coronary artery  S/p 3v CABG 01/2014   . Cataract 2019   bilateral eyes  . Childhood asthma    as a child  . Complication of anesthesia    "takes me a year to get over"  . Contact dermatitis    atypical [name])  . Controlled diabetes mellitus type 2 with complications ([place]) 17/40/8144   CAD   . COPD (chronic obstructive pulmonary disease) ([place])   . Coronary artery disease    a.2015: CABG w/ LIMA-LAD, RIMA-OM, SVG-RCA b. Cath on 07/10/15 w/ patent LIMA-LAD and RIMA-OM. SVG-RCA occluded but collaterals present  . Diverticulosis 2013   severe by CT and colonoscopy  . Environmental allergies    improved as ages  . Fracture of lateral malleolus of left ankle 08/29/2013  . Headache    hasn't had migraines in years  . Hepatic steatosis 06/2015   by [place]  . Hx of migraines   . Hypertension   . Lactose intolerance 11/14/2018   Endorses for years - avoids dairy products  . Lumbosacral radiculopathy at S1 03/2012   left, with spinal stenosis (MRI 04/2013) improved with TF ESI L5/S1 and S1/2 ([place])  . OSA (obstructive sleep apnea) 09/04/2014   Severe with AHI 35/hr, uses BIPAP  . Overweight (BMI 25.0-29.9) 01/26/2017  . Resistance to clopidogrel 2015   drug metabolism panel run - asked to scan  . Spinal stenosis    LS1 nerve root impingement from bulging disc  . Vitamin B12 deficiency   . Zenker diverticulum     Tobacco History: Social  History   Tobacco Use  Smoking Status Former Smoker  . Packs/day: 1.00  . Years: 44.00  . Pack years: 44.00  . Types: Cigarettes  . Start date: 59  . Quit date: 02/05/2014  . Years since quitting: 6.1  Smokeless Tobacco Never Used   Counseling given: Not Answered   Continue to not smoke  Outpatient Encounter Medications as of 03/13/2020  Medication Sig  . acetaminophen (TYLENOL) 325 MG tablet Take 2 tablets (650 mg total) by mouth every 6 (six) hours as needed for moderate pain or headache.  . albuterol (PROAIR HFA) 108 (90 Base) MCG/ACT inhaler Inhale 2 puffs into the lungs every 4 (four) hours as needed for wheezing or shortness of breath.  Jearl Klinefelter ELLIPTA 62.5-25 MCG/INH AEPB TAKE 1 PUFF BY MOUTH EVERY DAY  . aspirin EC 81 MG tablet Take 1 tablet (81 mg total) by mouth daily.  [name] atorvastatin (LIPITOR) 20 MG tablet TAKE 1 TABLET (20 MG TOTAL) BY MOUTH DAILY AT 6 PM.  . cholecalciferol (VITAMIN D3) 25 MCG (1000 UT) tablet Take 1,000 Units by mouth daily.  . famotidine (PEPCID) 20 MG tablet TAKE 1 TABLET BY MOUTH TWICE A DAY  . glucose blood (ONE TOUCH ULTRA TEST) test strip CHECK ONCE DAILY AND AS NEEDED  . glucose blood test strip CHECK ONCE DAILY AND AS NEEDED  . ipratropium-albuterol (DUONEB) 0.5-2.5 (3) MG/3ML SOLN Take 3 mLs by nebulization every 6 (six) hours as needed.  [name] ketotifen (ZADITOR) 0.025 % ophthalmic solution Place 1 drop into both eyes daily as needed (allergies).  . metoprolol tartrate (LOPRESSOR) 25 MG tablet TAKE 0.5 TABLETS (12.5 MG TOTAL) BY MOUTH 2 (TWO) TIMES DAILY.  [name] oxyCODONE (OXY IR/ROXICODONE) 5 MG immediate release tablet Take 5 mg by mouth every 4 (four) hours as needed.  . pantoprazole (PROTONIX) 40 MG tablet Take 1 tablet (40 mg total) by mouth daily.  . vitamin B-12 (CYANOCOBALAMIN) 500 MCG  tablet Take 1 tablet (500 mcg total) by mouth daily.  [name] azithromycin (ZITHROMAX) 250 MG tablet 500mg  (two tablets) today, then 250mg  (1 tablet) for the next 4  days  . predniSONE (DELTASONE) 10 MG tablet 4 tabs for 2 days, then 3 tabs for 2 days, 2 tabs for 2 days, then 1 tab for 2 days, then stop   No facility-administered encounter medications on file as of 03/13/2020.     Review of Systems  Review of Systems  Constitutional: Positive for fatigue. Negative for activity change, chills, fever and unexpected weight change.  HENT: Positive for congestion, postnasal drip and rhinorrhea. Negative for sinus pressure, sinus pain and sore throat.   Eyes: Negative.   Respiratory: Positive for cough (baseline ), shortness of breath and wheezing.   Cardiovascular: Negative for chest pain and palpitations.  Gastrointestinal: Negative for constipation, diarrhea, nausea and vomiting.  Endocrine: Negative.   Genitourinary: Negative.   Musculoskeletal: Negative.   Skin: Negative.   Neurological: Negative for dizziness and headaches.  Psychiatric/Behavioral: Negative.  Negative for dysphoric mood. The patient is not nervous/anxious.   All other systems reviewed and are negative.    Physical Exam  BP 128/86 (BP Location: Left Arm, Cuff Size: Normal)   Pulse 70   Temp 97.9 F (36.6 C) (Oral)   Ht 5\' 8"  (1.727 m)   Wt 194 lb 9.6 oz (88.3 kg)   SpO2 91%   BMI 29.59 kg/m   Wt Readings from Last 5 Encounters:  03/13/20 194 lb 9.6 oz (88.3 kg)  01/22/20 194 lb 9 oz (88.3 kg)  12/12/19 199 lb (90.3 kg)  10/30/19 200 lb (90.7 kg)  08/07/19 202 lb 3.2 oz (91.7 kg)    BMI Readings from Last 5 Encounters:  03/13/20 29.59 kg/m  01/22/20 29.58 kg/m  12/12/19 30.26 kg/m  10/30/19 29.53 kg/m  08/07/19 30.30 kg/m     Physical Exam Vitals and nursing note reviewed.  Constitutional:      General: He is not in acute distress.    Appearance: Normal appearance. He is normal weight.  HENT:     Head: Normocephalic and atraumatic.     Right Ear: Hearing, tympanic membrane, ear canal and external ear normal.     Left Ear: Hearing, tympanic membrane,  ear canal and external ear normal.     Nose: No mucosal edema.     Right Turbinates: Not enlarged.     Left Turbinates: Not enlarged.  Cardiovascular:     Rate and Rhythm: Normal rate and regular rhythm.     Pulses: Normal pulses.     Heart sounds: Normal heart sounds. No murmur heard.   Pulmonary:     Effort: Pulmonary effort is normal.     Breath sounds: Wheezing present. No decreased breath sounds or rales.  Musculoskeletal:     Cervical back: Normal range of motion.     Right lower leg: No edema.     Left lower leg: No edema.  Lymphadenopathy:     Cervical: No cervical adenopathy.  Skin:    General: Skin is warm and dry.     Capillary Refill: Capillary refill takes less than 2 seconds.     Findings: No erythema or rash.  Neurological:     General: No focal deficit present.     Mental Status: He is alert and oriented to person, place, and time.     Motor: No weakness.     Coordination: Coordination normal.     Gait:  Gait is intact. Gait normal.  Psychiatric:        Mood and Affect: Mood normal.        Behavior: Behavior normal. Behavior is cooperative.        Thought Content: Thought content normal.        Judgment: Judgment normal.       Assessment & Plan:   COPD, moderate ([place]) Suspected COPD exacerbation Have concerns that patient is at risk for COVID-19 given the fact that delta variant #community are high, patient is unvaccinated and has acute worsened respiratory symptoms  Plan: Encourage patient to obtain outpatient Covid testing Prednisone taper today Z-Pak today If patient symptoms worsen he will need to seek emergent evaluation Would recommend monoclonal antibody infusion if patient is positive for COVID-19 and within first 10 days of symptom onset If patient is negative for Covid strongly encouraged him to obtain COVID-19 vaccination once he is resolved from this is a exacerbation  Hypertension Patient has concerns about hypertension today.  Blood  pressure has worsened given acute worsened respiratory symptoms over this week.  Blood pressure readings from this week are as follows: 03/09/2020-112/79 03/10/2020-155/91 03/11/2020-159/102 03/12/2020-136/86 03/13/2020-149/95   Plan: Encourage patient to contact primary care and schedule a follow-up after he has obtained out patient Covid testing.  Cough Plan: Obtain outpatient Covid testing  Dyspnea Plan: Obtain outpatient Covid testing  Addendum: Contact information for COVID-19 outpatient testing provided on AVS.  Patient encouraged strongly to obtain outpatient Covid testing.  Patient is aware that him not getting Covid tested will delay his care as well as also limit his ability to be seen in our office again for acute symptoms over the next 10 to 14 days until this is obtained.   Return in about 2 months (around 05/13/2020), or if symptoms worsen or fail to improve, for Follow up with Dr. Loanne Drilling.   Lauraine Rinne, NP 03/13/2020   This appointment required 35 minutes of patient care (this includes precharting, chart review, review of results, face-to-face care, etc.).

## 2020-03-13 NOTE — Patient Instructions (Addendum)
You were seen today by Lauraine Rinne, NP  for:   1. COPD, moderate ([place]) 2. Shortness of breath 3. Cough  - azithromycin (ZITHROMAX) 250 MG tablet; 500mg  (two tablets) today, then 250mg  (1 tablet) for the next 4 days  Dispense: 6 tablet; Refill: 0 - predniSONE (DELTASONE) 10 MG tablet; 4 tabs for 2 days, then 3 tabs for 2 days, 2 tabs for 2 days, then 1 tab for 2 days, then stop  Dispense: 20 tablet; Refill: 0  I believe you need to be tested for SARS-CoV-2.  Please contact the number [phone] to schedule this appointment in either [facility] or [place].  This should be scheduled immediately.  If you you are positive for Covid I would recommend the monoclonal antibody infusion as long as the infusion is within the first 10 days of symptom onset  If you were negative for Covid after you recover from this exacerbation would recommend strongly that you obtain the COVID-19 vaccine  If symptoms worsen such as chest pain, increased shortness of breath, oxygen levels dropping you will need to seek emergent evaluation at an emergency room or urgent care  We recommend today:   Meds ordered this encounter  Medications  . azithromycin (ZITHROMAX) 250 MG tablet    Sig: 500mg  (two tablets) today, then 250mg  (1 tablet) for the next 4 days    Dispense:  6 tablet    Refill:  0  . predniSONE (DELTASONE) 10 MG tablet    Sig: 4 tabs for 2 days, then 3 tabs for 2 days, 2 tabs for 2 days, then 1 tab for 2 days, then stop    Dispense:  20 tablet    Refill:  0    Follow Up:    Return in about 2 months (around 05/13/2020), or if symptoms worsen or fail to improve, for Follow up with Dr. Loanne Drilling.   Please do your part to reduce the spread of COVID-19:      Reduce your risk of any infection  and COVID19 by using the similar precautions used for avoiding the common cold or flu:  [name] Wash your hands often with soap and warm water for at least 20 seconds.  If soap and water are not readily  available, use an alcohol-based hand sanitizer with at least 60% alcohol.  . If coughing or sneezing, cover your mouth and nose by coughing or sneezing into the elbow areas of your shirt or coat, into a tissue or into your sleeve (not your hands). Langley Gauss A MASK when in public  . Avoid shaking hands with others and consider head nods or verbal greetings only. . Avoid touching your eyes, nose, or mouth with unwashed hands.  . Avoid close contact with people who are sick. . Avoid places or events with large numbers of people in one location, like concerts or sporting events. . If you have some symptoms but not all symptoms, continue to monitor at home and seek medical attention if your symptoms worsen. . If you are having a medical emergency, call 911.   [place] / e-Visit: [email or web address]         [facility]: [phone]  [facility]: [phone]                   [facility]: [phone]     It is flu season:   >>> Best ways to protect herself from  the flu: Receive the yearly flu vaccine, practice good hand hygiene washing with soap and also using hand sanitizer when available, eat a nutritious meals, get adequate rest, hydrate appropriately   Please contact the office if your symptoms worsen or you have concerns that you are not improving.   Thank you for choosing [facility] for your healthcare, and for allowing [place] to partner with you on your healthcare journey. I am thankful to be able to provide care to you today.   Wyn Quaker FNP-C

## 2020-03-13 NOTE — Assessment & Plan Note (Signed)
Plan: Obtain outpatient Covid testing

## 2020-03-13 NOTE — Assessment & Plan Note (Signed)
Suspected COPD exacerbation Have concerns that patient is at risk for COVID-19 given the fact that delta variant #community are high, patient is unvaccinated and has acute worsened respiratory symptoms  Plan: Encourage patient to obtain outpatient Covid testing Prednisone taper today Z-Pak today If patient symptoms worsen he will need to seek emergent evaluation Would recommend monoclonal antibody infusion if patient is positive for COVID-19 and within first 10 days of symptom onset If patient is negative for Covid strongly encouraged him to obtain COVID-19 vaccination once he is resolved from this is a exacerbation

## 2020-03-13 NOTE — Assessment & Plan Note (Signed)
Patient has concerns about hypertension today.  Blood pressure has worsened given acute worsened respiratory symptoms over this week.  Blood pressure readings from this week are as follows: 03/09/2020-112/79 03/10/2020-155/91 03/11/2020-159/102 03/12/2020-136/86 03/13/2020-149/95   Plan: Encourage patient to contact primary care and schedule a follow-up after he has obtained out patient Covid testing.

## 2020-03-14 ENCOUNTER — Ambulatory Visit: Payer: PPO | Admitting: Pulmonary Disease

## 2020-03-14 ENCOUNTER — Other Ambulatory Visit: Payer: Self-pay

## 2020-03-14 ENCOUNTER — Telehealth: Payer: Self-pay

## 2020-03-14 ENCOUNTER — Telehealth: Payer: Self-pay | Admitting: Pulmonary Disease

## 2020-03-14 NOTE — Telephone Encounter (Signed)
Spoke with pt's wife, Dorian Pod (on dpr), relaying Dr. Synthia Innocent message.  She verbalizes understanding and will inform pt.

## 2020-03-14 NOTE — Telephone Encounter (Signed)
Ok to increase metoprolol to 25mg  bid as long as pulse stays >50s.  Update [place] next week with BP readings over weekend, if staying high may need to add second BP med.  BP elevation could be from feeling ill from current COPD exacerbation

## 2020-03-14 NOTE — Telephone Encounter (Signed)
03/14/20   03/13/20-SARS-CoV-2-negative  We will get patient's test results scanned into the computer and placed into the media tab.  This is reassuring news.  Patient needs to proceed forward with prednisone taper as outlined as well as Z-Pak as outlined from last office visit.    Please ensure patient has follow-up with Dr. Loanne Drilling in 4 to 6 weeks.  Wyn Quaker, FNP

## 2020-03-14 NOTE — Telephone Encounter (Signed)
Called and spoke with pt letting him know the info stated by Aaron Edelman and he verbalized understanding. Pt wanted to know how soon after finishing steroids should he wait prior to receiving covid vaccine. Per Aaron Edelman, pt can have vaccine 10 days after finishing steroids and pt verbalized understanding of this info.  Pt has also been scheduled a f/u appt with Dr. Loanne Drilling 10/13. Nothing further needed.

## 2020-03-14 NOTE — Telephone Encounter (Signed)
Starting earlier this week, his BP has been going up and he has been having a headache. The highest it has gone up has been 159/102. He decided to take a whole metoprolol tablet this morning. Asking what he needs to do. Please advise at [phone].

## 2020-03-18 MED ORDER — AMLODIPINE BESYLATE 5 MG PO TABS: 5.0000 mg | ORAL_TABLET | Freq: Every day | ORAL | 3 refills | Status: DC

## 2020-03-18 NOTE — Telephone Encounter (Addendum)
BP is staying high. Does he have any pulse readings?  Would add amlodipine 5mg  daily - sent to pharmacy. Update [place] 1 wk after starting with effect. Watch for ankle swelling or flushing on this medication.  Would offer OV for HTN recheck in 2 wks

## 2020-03-18 NOTE — Telephone Encounter (Signed)
Pt left /vm at 11:30  Requesting cb about BP reading. Beatriz Stallion CMA took call at 11:38 AM and Dr Darnell Level has responed. Sending to [facility] CMA.

## 2020-03-18 NOTE — Telephone Encounter (Signed)
Spoke with pt asking about pulse readings.  He didn't give the following readings with no dates: 91 93 95 93 71 66 91 92 67 67   I relayed Dr. Synthia Innocent message.  Pt verbalizes understanding.  Scheduled HTN f/u on 04/02/20 at 8:00.

## 2020-03-18 NOTE — Telephone Encounter (Signed)
Pt has called back.  Readings  9-4 am 157/87,     pm 136/90                  9-5 am  158/92,     pm 140/90       165/107                  9-6 am   167/99,    pm 144/101                   9-7 am   160/102    He has been taking a whole tablet of metoprolol twice a day. Asking what he needs to do. Still having headache.

## 2020-03-18 NOTE — Addendum Note (Signed)
Addended by: [name] on: 03/18/2020 01:58 PM   Modules accepted: Orders

## 2020-04-02 ENCOUNTER — Ambulatory Visit: Payer: PPO | Admitting: Family Medicine

## 2020-04-07 ENCOUNTER — Encounter: Payer: Self-pay | Admitting: Family Medicine

## 2020-04-07 ENCOUNTER — Ambulatory Visit (INDEPENDENT_AMBULATORY_CARE_PROVIDER_SITE_OTHER)
Admission: RE | Admit: 2020-04-07 | Discharge: 2020-04-07 | Disposition: A | Discharge status: Home / Self Care | Payer: PPO | Source: Ambulatory Visit | Attending: Family Medicine | Admitting: Family Medicine

## 2020-04-07 ENCOUNTER — Other Ambulatory Visit: Payer: Self-pay

## 2020-04-07 ENCOUNTER — Ambulatory Visit (INDEPENDENT_AMBULATORY_CARE_PROVIDER_SITE_OTHER): Payer: PPO | Admitting: Family Medicine

## 2020-04-07 VITALS — BP 134/86 | HR 66 | Temp 97.9°F | Ht 68.0 in | Wt 195.6 lb

## 2020-04-07 DIAGNOSIS — Z8551 Personal history of malignant neoplasm of bladder: Secondary | ICD-10-CM | POA: Diagnosis not present

## 2020-04-07 DIAGNOSIS — R519 Headache, unspecified: Secondary | ICD-10-CM

## 2020-04-07 DIAGNOSIS — I10 Essential (primary) hypertension: Secondary | ICD-10-CM

## 2020-04-07 DIAGNOSIS — R0609 Other forms of dyspnea: Secondary | ICD-10-CM

## 2020-04-07 DIAGNOSIS — Z9889 Other specified postprocedural states: Secondary | ICD-10-CM | POA: Diagnosis not present

## 2020-04-07 DIAGNOSIS — R0600 Dyspnea, unspecified: Secondary | ICD-10-CM

## 2020-04-07 DIAGNOSIS — J449 Chronic obstructive pulmonary disease, unspecified: Secondary | ICD-10-CM | POA: Diagnosis not present

## 2020-04-07 DIAGNOSIS — Z23 Encounter for immunization: Secondary | ICD-10-CM | POA: Diagnosis not present

## 2020-04-07 MED ORDER — METOPROLOL TARTRATE 25 MG PO TABS: 25.0000 mg | ORAL_TABLET | Freq: Two times a day (BID) | ORAL | 3 refills | Status: AC

## 2020-04-07 MED ORDER — PREDNISONE 20 MG PO TABS: ORAL_TABLET | ORAL | 0 refills | Status: DC

## 2020-04-07 NOTE — Patient Instructions (Addendum)
Flu shot today  EKG today Xray today  Short prednisone course today.  Continue current regimen.  Return as needed or in 3 months for follow up visit.  I recommend [employer] or Moderna for covid vaccination.

## 2020-04-07 NOTE — Progress Notes (Signed)
This visit was conducted in person.  BP 134/86 (BP Location: Left Arm, Patient Position: Sitting, Cuff Size: Normal)   Pulse 66   Temp 97.9 F (36.6 C) (Temporal)   Ht 5\' 8"  (1.727 m)   Wt 195 lb 9 oz (88.7 kg)   SpO2 96%   BMI 29.74 kg/m   BP Readings from Last 3 Encounters:  04/07/20 134/86  03/13/20 128/86  01/22/20 126/68    CC: HTN f/u visit  Subjective:    Patient ID: Carl Lee, male    DOB: 30-Jul-1955, 64 y.o.   MRN: [id number]  HPI: Carl Lee is a 64 y.o. male presenting on 04/07/2020 for Hypertension (Here for 2 wk f/u.)   Recently elevated blood pressure readings of unclear cause, in setting of COPD exacerbation treated with zpack and prednisone course by pulm. Did feel some better while on meds. Some persistent respiratory issues, planned pulm f/u in 2 weeks. BP up to 159/102. We increased metoprolol to 25mg  bid. We also added amlodipine 5mg  daily (this was previously stopped by cardiology 12/2019 due to hypotension).  Denies diet changes.  Brings BP log - 106-160/70-102.  No temporal relation to low blood pressures.  HA with elevated bp readings managing with tylenol.  No vision changes, leg swelling.   Continues anoro ellipta 1 puff daily.  Mildly productive cough with mild wheezing.   Notes worsening exertional dyspnea. O2 levels stay normal. Notes bilateral ribcage chest pain and substernal tightness, sees cardiology.   Ongoing back pain - sees back doctor for this.   Needs to schedule f/u with urology (bladder cancer) and [facility] (AAA) and Dr Radford [facility] (OSA)     Relevant past medical, surgical, family and social history reviewed and updated as indicated. Interim medical history since our last visit reviewed. Allergies and medications reviewed and updated. Outpatient Medications Prior to Visit  Medication Sig Dispense Refill  . acetaminophen (TYLENOL) 325 MG tablet Take 2 tablets (650 mg total) by mouth every 6 (six) hours as needed for moderate pain  or headache. 60 tablet 0  . albuterol (PROAIR HFA) 108 (90 Base) MCG/ACT inhaler Inhale 2 puffs into the lungs every 4 (four) hours as needed for wheezing or shortness of breath. 1 g 3  . amLODipine (NORVASC) 5 MG tablet Take 1 tablet (5 mg total) by mouth daily. 30 tablet 3  . ANORO ELLIPTA 62.5-25 MCG/INH AEPB TAKE 1 PUFF BY MOUTH EVERY DAY 180 each 1  . aspirin EC 81 MG tablet Take 1 tablet (81 mg total) by mouth daily. 90 tablet 1  . atorvastatin (LIPITOR) 20 MG tablet TAKE 1 TABLET (20 MG TOTAL) BY MOUTH DAILY AT 6 PM. 90 tablet 3  . cholecalciferol (VITAMIN D3) 25 MCG (1000 UT) tablet Take 1,000 Units by mouth daily.    . famotidine (PEPCID) 20 MG tablet TAKE 1 TABLET BY MOUTH TWICE A DAY 180 tablet 1  . glucose blood (ONE TOUCH ULTRA TEST) test strip CHECK ONCE DAILY AND AS NEEDED    . glucose blood test strip CHECK ONCE DAILY AND AS NEEDED    . ipratropium-albuterol (DUONEB) 0.5-2.5 (3) MG/3ML SOLN Take 3 mLs by nebulization every 6 (six) hours as needed. 360 mL 0  . ketotifen (ZADITOR) 0.025 % ophthalmic solution Place 1 drop into both eyes daily as needed (allergies). 5 mL 1  . oxyCODONE (OXY IR/ROXICODONE) 5 MG immediate release tablet Take 5 mg by mouth every 4 (four) hours as needed.    [name]  pantoprazole (PROTONIX) 40 MG tablet Take 1 tablet (40 mg total) by mouth daily. 90 tablet 3  . vitamin B-12 (CYANOCOBALAMIN) 500 MCG tablet Take 1 tablet (500 mcg total) by mouth daily.    . metoprolol tartrate (LOPRESSOR) 25 MG tablet TAKE 0.5 TABLETS (12.5 MG TOTAL) BY MOUTH 2 (TWO) TIMES DAILY. 90 tablet 3  . azithromycin (ZITHROMAX) 250 MG tablet 500mg  (two tablets) today, then 250mg  (1 tablet) for the next 4 days 6 tablet 0  . predniSONE (DELTASONE) 10 MG tablet 4 tabs for 2 days, then 3 tabs for 2 days, 2 tabs for 2 days, then 1 tab for 2 days, then stop 20 tablet 0   No facility-administered medications prior to visit.     Per HPI unless specifically indicated in ROS section below Review  of Systems Objective:  BP 134/86 (BP Location: Left Arm, Patient Position: Sitting, Cuff Size: Normal)   Pulse 66   Temp 97.9 F (36.6 C) (Temporal)   Ht 5\' 8"  (1.727 m)   Wt 195 lb 9 oz (88.7 kg)   SpO2 96%   BMI 29.74 kg/m   Wt Readings from Last 3 Encounters:  04/07/20 195 lb 9 oz (88.7 kg)  03/13/20 194 lb 9.6 oz (88.3 kg)  01/22/20 194 lb 9 oz (88.3 kg)      Physical Exam Vitals and nursing note reviewed.  Constitutional:      Appearance: Normal appearance. He is not ill-appearing.  Cardiovascular:     Rate and Rhythm: Normal rate and regular rhythm.     Pulses: Normal pulses.     Heart sounds: Normal heart sounds. No murmur heard.   Pulmonary:     Effort: Pulmonary effort is normal. No respiratory distress.     Breath sounds: Wheezing (faint exp) present. No rhonchi or rales.     Comments: Few crackles bibasilarly Musculoskeletal:     Right lower leg: No edema.     Left lower leg: No edema.  Skin:    General: Skin is warm and dry.     Findings: No rash.  Neurological:     Mental Status: He is alert.  Psychiatric:        Mood and Affect: Mood normal.        Behavior: Behavior normal.       Results for orders placed or performed in visit on 01/22/20  POCT glycosylated hemoglobin (Hb A1C)  Result Value Ref Range   Hemoglobin A1C 6.0 (A) 4.0 - 5.6 %   HbA1c POC (<> result, manual entry)     HbA1c, POC (prediabetic range)     HbA1c, POC (controlled diabetic range)     DG Chest 2 View CLINICAL DATA:  Exertional dyspnea  EXAM: CHEST - 2 VIEW  COMPARISON:  04/17/2019  FINDINGS: Cardiac shadow is within normal limits. Postsurgical changes are seen. Aortic calcifications are noted. Lungs are well aerated bilaterally. No focal infiltrate or effusion is seen. No bony abnormality is noted.  IMPRESSION: No acute abnormality noted.  Electronically Signed   By: Inez Catalina M.D.   On: 04/07/2020 12:16   EKG - sinus bradycardia, normal axis, intervals, no  acute ST/T changes, q waves anteroseptally with poor R wave transition - largely unchanged.  Assessment & Plan:  This visit occurred during the SARS-CoV-2 public health emergency.  Safety protocols were in place, including screening questions prior to the visit, additional usage of staff PPE, and extensive cleaning of exam room while observing appropriate contact time as  indicated for disinfecting solutions.  I encouraged he get his COVID vaccine.  Problem List Items Addressed This Visit    Hypertension - Primary    Unclear cause of recent elevation but improvement in readings noted since restarting amlodipine. Will continue current regimen of metoprolol 25mg  bid and amlodipine 5mg  daily.       Relevant Medications   metoprolol tartrate (LOPRESSOR) 25 MG tablet   Other Relevant Orders   EKG 12-Lead (Completed)   History of bladder cancer    Due for uro f/u, having trouble scheduling - will re-refer      Relevant Orders   Ambulatory referral to Urology   History of AAA (abdominal aortic aneurysm) repair    Due for [facility] f/u (last seen 2019), having trouble scheduling appt - will re-refer.      Relevant Orders   Ambulatory referral to Vascular Surgery   Headache    Seems related to hypertension - will continue working towards better BP control.       Relevant Medications   metoprolol tartrate (LOPRESSOR) 25 MG tablet   Exertional dyspnea    Recent COPD exacerbation, treated with abx and steroid course.  ?persistent exacerbation - will Rx short prednisone course. Check CXR and EKG today.  Closely followed by cardiology.       Relevant Orders   DG Chest 2 View (Completed)   COPD, moderate ([place])   Relevant Medications   predniSONE (DELTASONE) 20 MG tablet    Other Visit Diagnoses    Need for influenza vaccination       Relevant Orders   Flu Vaccine QUAD High Dose(Fluad) (Completed)       Meds ordered this encounter  Medications  . metoprolol tartrate (LOPRESSOR) 25 MG  tablet    Sig: Take 1 tablet (25 mg total) by mouth 2 (two) times daily.    Dispense:  180 tablet    Refill:  3    Note new sig  . predniSONE (DELTASONE) 20 MG tablet    Sig: Take two tablets daily for 3 days followed by one tablet daily for 4 days    Dispense:  10 tablet    Refill:  0   Orders Placed This Encounter  Procedures  . DG Chest 2 View    Standing Status:   Future    Number of Occurrences:   1    Standing Expiration Date:   04/07/2021    Order Specific Question:   Reason for Exam (SYMPTOM  OR DIAGNOSIS REQUIRED)    Answer:   exertional dyspnea, COPD    Order Specific Question:   Preferred imaging location?    Answer:   [facility]    Order Specific Question:   Radiology Contrast Protocol - do NOT remove file path    Answer:   [email or web address]  . Flu Vaccine QUAD High Dose(Fluad)  . Ambulatory referral to Urology    Referral Priority:   Routine    Referral Type:   Consultation    Referral Reason:   Specialty Services Required    Requested Specialty:   Urology    Number of Visits Requested:   1  . Ambulatory referral to Vascular Surgery    Referral Priority:   Routine    Referral Type:   Surgical    Referral Reason:   Specialty Services Required    Requested Specialty:   Vascular Surgery    Number of Visits Requested:   1  . EKG 12-Lead  Patient Instructions  Flu shot today  EKG today Xray today  Short prednisone course today.  Continue current regimen.  Return as needed or in 3 months for follow up visit.  I recommend [employer] or Moderna for covid vaccination.   Follow up plan: Return in about 3 months (around 07/07/2020), or if symptoms worsen or fail to improve.  [name], MD

## 2020-04-08 NOTE — Assessment & Plan Note (Signed)
Seems related to hypertension - will continue working towards better BP control.

## 2020-04-08 NOTE — Assessment & Plan Note (Addendum)
Recent COPD exacerbation, treated with abx and steroid course.  ?persistent exacerbation - will Rx short prednisone course. Check CXR and EKG today.  Closely followed by cardiology.

## 2020-04-08 NOTE — Assessment & Plan Note (Signed)
Due for uro f/u, having trouble scheduling - will re-refer

## 2020-04-08 NOTE — Assessment & Plan Note (Signed)
Unclear cause of recent elevation but improvement in readings noted since restarting amlodipine. Will continue current regimen of metoprolol 25mg  bid and amlodipine 5mg  daily.

## 2020-04-08 NOTE — Assessment & Plan Note (Addendum)
Due for [facility] f/u (last seen 2019), having trouble scheduling appt - will re-refer.

## 2020-04-10 ENCOUNTER — Other Ambulatory Visit: Payer: Self-pay | Admitting: Cardiovascular Disease

## 2020-04-10 ENCOUNTER — Other Ambulatory Visit: Payer: Self-pay

## 2020-04-10 DIAGNOSIS — I700 Atherosclerosis of aorta: Secondary | ICD-10-CM

## 2020-04-15 ENCOUNTER — Other Ambulatory Visit: Payer: Self-pay | Admitting: Family Medicine

## 2020-04-19 ENCOUNTER — Other Ambulatory Visit: Payer: Self-pay | Admitting: Pulmonary Disease

## 2020-04-19 DIAGNOSIS — J449 Chronic obstructive pulmonary disease, unspecified: Secondary | ICD-10-CM

## 2020-04-23 ENCOUNTER — Other Ambulatory Visit: Payer: Self-pay

## 2020-04-23 ENCOUNTER — Ambulatory Visit: Payer: PPO | Admitting: Pulmonary Disease

## 2020-04-23 ENCOUNTER — Encounter: Payer: Self-pay | Admitting: Pulmonary Disease

## 2020-04-23 VITALS — BP 106/66 | HR 68 | Temp 97.6°F | Ht 68.0 in | Wt 197.8 lb

## 2020-04-23 DIAGNOSIS — J441 Chronic obstructive pulmonary disease with (acute) exacerbation: Secondary | ICD-10-CM

## 2020-04-23 DIAGNOSIS — J449 Chronic obstructive pulmonary disease, unspecified: Secondary | ICD-10-CM

## 2020-04-23 MED ORDER — BREZTRI AEROSPHERE 160-9-4.8 MCG/ACT IN AERO: 2.0000 | INHALATION_SPRAY | Freq: Two times a day (BID) | RESPIRATORY_TRACT | 0 refills | Status: DC

## 2020-04-23 MED ORDER — PREDNISONE 20 MG PO TABS: 40.0000 mg | ORAL_TABLET | Freq: Every day | ORAL | 0 refills | Status: AC

## 2020-04-23 NOTE — Patient Instructions (Addendum)
Severe COPD with exacerbation STOP Anoro START Prednisone 40 mg daily START Breztri TWO puffs TWICE a day CONTINUE Albuterol nebulizer and handheld as needed We discussed Pulmonary Rehab and benefits of supervised exercise to develop respiratory strength  Follow-up in 1 month with me

## 2020-04-23 NOTE — Progress Notes (Signed)
Subjective:   PATIENT ID: Carl Lee GENDER: male DOB: December 22, 1955, MRN: [id number]   HPI  Chief Complaint  Patient presents with  . Follow-up    ever since he received last covid shot 8-10 days ago, more sob with exertion, wakes up at night. one week ago was off prednisone and doing really well.    Reason for Visit: Follow-up   Carl Lee is a 64 year old male former smoker with moderate COPD, OSA on BiPAP, HTN, CABG x 3, CAD, atrial tachycardia, GERD, DM2, hx bladder cancer, triple AAA who presents for follow-up.  04/23/20 He feels his COPD is not under control. Recently treated for COPD exacerbation  X 2 last month with our office and by his PCP. He has worsening shortness of breath with exertion. Denies wheezing and cough. Has chest congestion. His oxygen is 93-94%. He is compliant with his Anoro. He has needed to use his albuterol inhaler 1-2 times a day which he feels provide some relief after 5 minutes with rest. He is chronic chest pain that is unchanged and evaluated by his Cardiologist, Dr. [name], on 12/31/19 who reports his condition is stable. He reports walking 4-5 miles once a week at varying paces. He is compliant with his CPAP for his OSA which is followed by another provider  Social History: Former smoker. 44 pack- years.  I have personally reviewed patient's past medical/family/social history/allergies/current medications.  Past Medical History:  Diagnosis Date  . AAA (abdominal aortic aneurysm) ([place]) 03/25/12   s/p endovascular repair  . Adenomatous polyp 06/2009  . Atrial tachycardia, paroxysmal ([place]) 06/18/2016  . Bladder cancer ([place]) 08/16/2008   recurrence 2015 and 2018 treated with office fulguration  . CAD (coronary artery disease), native coronary artery    S/p 3v CABG 01/2014   . Cataract 2019   bilateral eyes  . Childhood asthma    as a child  . Complication of anesthesia    "takes me a year to get over"  . Contact dermatitis    atypical  [name])  . Controlled diabetes mellitus type 2 with complications ([place]) 75/17/0017   CAD   . COPD (chronic obstructive pulmonary disease) ([place])   . Coronary artery disease    a.2015: CABG w/ LIMA-LAD, RIMA-OM, SVG-RCA b. Cath on 07/10/15 w/ patent LIMA-LAD and RIMA-OM. SVG-RCA occluded but collaterals present  . Diverticulosis 2013   severe by CT and colonoscopy  . Environmental allergies    improved as ages  . Fracture of lateral malleolus of left ankle 08/29/2013  . Headache    hasn't had migraines in years  . Hepatic steatosis 06/2015   by [place]  . Hx of migraines   . Hypertension   . Lactose intolerance 11/14/2018   Endorses for years - avoids dairy products  . Lumbosacral radiculopathy at S1 03/2012   left, with spinal stenosis (MRI 04/2013) improved with TF ESI L5/S1 and S1/2 ([place])  . OSA (obstructive sleep apnea) 09/04/2014   Severe with AHI 35/hr, uses BIPAP  . Overweight (BMI 25.0-29.9) 01/26/2017  . Resistance to clopidogrel 2015   drug metabolism panel run - asked to scan  . Spinal stenosis    LS1 nerve root impingement from bulging disc  . Vitamin B12 deficiency   . Zenker diverticulum      Allergies  Allergen Reactions  . Codeine Other (See Comments)    Nightmares  . Doxycycline Hives and Other (See Comments)    mild  Outpatient Medications Prior to Visit  Medication Sig Dispense Refill  . acetaminophen (TYLENOL) 325 MG tablet Take 2 tablets (650 mg total) by mouth every 6 (six) hours as needed for moderate pain or headache. 60 tablet 0  . albuterol (VENTOLIN HFA) 108 (90 Base) MCG/ACT inhaler TAKE 2 PUFFS BY MOUTH EVERY 6 HOURS AS NEEDED FOR WHEEZE OR SHORTNESS OF BREATH 8.5 each 2  . amLODipine (NORVASC) 5 MG tablet Take 1 tablet (5 mg total) by mouth daily. 30 tablet 3  . ANORO ELLIPTA 62.5-25 MCG/INH AEPB TAKE 1 PUFF BY MOUTH EVERY DAY 180 each 1  . aspirin EC 81 MG tablet Take 1 tablet (81 mg total) by mouth daily. 90 tablet 1  . atorvastatin  (LIPITOR) 20 MG tablet TAKE 1 TABLET (20 MG TOTAL) BY MOUTH DAILY AT 6 PM. 90 tablet 3  . cholecalciferol (VITAMIN D3) 25 MCG (1000 UT) tablet Take 1,000 Units by mouth daily.    . famotidine (PEPCID) 20 MG tablet TAKE 1 TABLET BY MOUTH TWICE A DAY 180 tablet 1  . glucose blood (ONE TOUCH ULTRA TEST) test strip CHECK ONCE DAILY AND AS NEEDED    . glucose blood test strip CHECK ONCE DAILY AND AS NEEDED    . ipratropium-albuterol (DUONEB) 0.5-2.5 (3) MG/3ML SOLN Take 3 mLs by nebulization every 6 (six) hours as needed. 360 mL 0  . ketotifen (ZADITOR) 0.025 % ophthalmic solution Place 1 drop into both eyes daily as needed (allergies). 5 mL 1  . metoprolol tartrate (LOPRESSOR) 25 MG tablet Take 1 tablet (25 mg total) by mouth 2 (two) times daily. 180 tablet 3  . oxyCODONE (OXY IR/ROXICODONE) 5 MG immediate release tablet Take 5 mg by mouth every 4 (four) hours as needed.    . pantoprazole (PROTONIX) 40 MG tablet Take 1 tablet (40 mg total) by mouth daily. 90 tablet 3  . predniSONE (DELTASONE) 20 MG tablet Take two tablets daily for 3 days followed by one tablet daily for 4 days 10 tablet 0  . vitamin B-12 (CYANOCOBALAMIN) 500 MCG tablet Take 1 tablet (500 mcg total) by mouth daily.     No facility-administered medications prior to visit.    Review of Systems  Constitutional: Negative for chills, diaphoresis, fever, malaise/fatigue and weight loss.  HENT: Negative for congestion.   Respiratory: Positive for shortness of breath. Negative for cough, hemoptysis, sputum production and wheezing.   Cardiovascular: Negative for chest pain, palpitations and leg swelling.     Objective:   Vitals:   04/23/20 0957  BP: 106/66  Pulse: 68  Temp: 97.6 F (36.4 C)  TempSrc: Temporal  SpO2: 91%  Weight: 197 lb 12.8 oz (89.7 kg)  Height: 5\' 8"  (1.727 m)   SpO2: 91 % O2 Device: None (Room air)   Physical Exam: General: Well-appearing, no acute distress HENT: [place], AT Eyes: EOMI, no scleral  icterus Respiratory: Clear to auscultation bilaterally.  No crackles, wheezing or rales Cardiovascular: RRR, -M/R/G, no JVD Extremities:-Edema,-tenderness Neuro: AAO x4, CNII-XII grossly intact Skin: Intact, no rashes or bruising Psych: Normal mood, normal affect  Data Reviewed:  Imaging: CT Chest Lung Screen 07/20/19 - Centrilobular and paraseptal emphysema. Tiny pulmonary nodules.  PFT: 11/29/17 FVC 3.0 (66%) FEV1 1.7 (49%) Ratio 56  Interpretation: Severe obstructive defect present  Labs: CBC    Component Value Date/Time   WBC 9.3 07/17/2019 0829   RBC 5.31 07/17/2019 0829   HGB 16.7 07/17/2019 0829   HGB 16.7 01/31/2019 0940   HCT  49.3 07/17/2019 0829   HCT 47.5 01/31/2019 0940   PLT 237.0 07/17/2019 0829   PLT 255 01/31/2019 0940   MCV 93.0 07/17/2019 0829   MCV 93 01/31/2019 0940   MCV 96 01/09/2014 1237   MCH 32.3 04/21/2019 0349   MCHC 33.8 07/17/2019 0829   RDW 14.8 07/17/2019 0829   RDW 12.6 01/31/2019 0940   RDW 13.5 01/09/2014 1237   LYMPHSABS 2.9 07/17/2019 0829   LYMPHSABS 2.8 01/31/2019 0940   MONOABS 0.8 07/17/2019 0829   EOSABS 0.5 07/17/2019 0829   EOSABS 0.1 01/31/2019 0940   BASOSABS 0.1 07/17/2019 0829   BASOSABS 0.1 01/31/2019 0940   Imaging, labs and test noted above have been reviewed independently by me.    Assessment & Plan:   Discussion: 64 year old male former smoker with moderate COPD, OSA on BiPAP, hx CAD s/p 3vCABG, atrial tachycardia and AAA who presents for follow-up. Counseled on clinical course of COPD and how hypoxemia does not need to be present to cause shortness of breath. Shortness of breath can be caused by multiple issues including uncontrolled COPD and OSA as well as deconditioning. Discussed inhaler management.  Severe COPD with emphysema, in active exacerbation STOP Anoro START Prednisone 40 mg daily START Breztri TWO puffs TWICE a day CONTINUE Albuterol nebulizer and handheld as needed We discussed Pulmonary Rehab  and benefits of supervised exercise to develop respiratory strength  OSA on CPAP Followed by another provider. Planning to schedule an appointment  CT Lung Screen Due in 06/2020  Health Maintenance Immunization History  Administered Date(s) Administered  . Fluad Quad(high Dose 65+) 04/07/2020  . Influenza,inj,Quad PF,6+ Mos 04/17/2013, 06/17/2014, 05/01/2015, 03/25/2016, 04/01/2017, 07/14/2018, 03/22/2019  . Pneumococcal Conjugate-13 05/01/2015  . Pneumococcal Polysaccharide-23 06/17/2014, 08/07/2019  . Tdap 12/14/2012  . Zoster 12/23/2015   CT Lung Screen - Due 06/2020  No orders of the defined types were placed in this encounter.  Meds ordered this encounter  Medications  . predniSONE (DELTASONE) 20 MG tablet    Sig: Take 2 tablets (40 mg total) by mouth daily with breakfast for 5 days.    Dispense:  10 tablet    Refill:  0  . Budeson-Glycopyrrol-Formoterol (BREZTRI AEROSPHERE) 160-9-4.8 MCG/ACT AERO    Sig: Inhale 2 puffs into the lungs in the morning and at bedtime.    Dispense:  10.7 g    Refill:  0    Order Specific Question:   Lot Number?    Answer:   [id number]    Order Specific Question:   Expiration Date?    Answer:   10/08/2021    Order Specific Question:   Manufacturer?    Answer:   [employer] [[id number]]    Order Specific Question:   Quantity    Answer:   3    Return in about 1 month (around 05/24/2020).  [place], MD [facility] 04/23/2020 10:22 AM  Office Number [phone]

## 2020-04-27 ENCOUNTER — Encounter: Payer: Self-pay | Admitting: Pulmonary Disease

## 2020-04-28 ENCOUNTER — Other Ambulatory Visit: Payer: Self-pay

## 2020-04-28 ENCOUNTER — Ambulatory Visit: Payer: PPO | Admitting: Surgery

## 2020-04-28 ENCOUNTER — Ambulatory Visit (HOSPITAL_COMMUNITY)
Admission: RE | Admit: 2020-04-28 | Discharge: 2020-04-28 | Disposition: A | Discharge status: Home / Self Care | Payer: PPO | Source: Ambulatory Visit | Attending: Surgery | Admitting: Surgery

## 2020-04-28 DIAGNOSIS — I700 Atherosclerosis of aorta: Secondary | ICD-10-CM | POA: Insufficient documentation

## 2020-05-12 ENCOUNTER — Encounter: Payer: Self-pay | Admitting: Surgery

## 2020-05-12 ENCOUNTER — Ambulatory Visit: Payer: PPO | Admitting: Surgery

## 2020-05-12 ENCOUNTER — Other Ambulatory Visit: Payer: Self-pay

## 2020-05-12 ENCOUNTER — Telehealth: Payer: Self-pay | Admitting: Pulmonary Disease

## 2020-05-12 VITALS — BP 138/85 | HR 62 | Temp 98.8°F | Resp 20 | Ht 68.0 in | Wt 197.8 lb

## 2020-05-12 DIAGNOSIS — I714 Abdominal aortic aneurysm, without rupture, unspecified: Secondary | ICD-10-CM

## 2020-05-12 NOTE — Telephone Encounter (Signed)
Lm for patient.  

## 2020-05-12 NOTE — Progress Notes (Signed)
Vascular and Vein Specialist of [facility]  Patient name: Carl Lee MRN: [id number] DOB: 06-25-1956 Sex: male   REASON FOR VISIT:    Follow up  HISOTRY OF PRESENT ILLNESS:     Carl Lee is a 64 y.o. male who is status post endovascular repair of a symptomatic abdominal aortic aneurysm on 03/25/2012 using a Cook Zenith device.  He has been difficult to evaluate with ultrasound and so CT scan follow-up has been recommended.  His preoperative CABG Doppler study showed no significant carotid stenosis.    He needs to have issues with back and hip pain secondary to his spinal stenosis.[name]  He takes a statin for hypercholesterolemia.  He suffers from COPD.  PAST MEDICAL HISTORY:   Past Medical History:  Diagnosis Date  . AAA (abdominal aortic aneurysm) ([place]) 03/25/12   s/p endovascular repair  . Adenomatous polyp 06/2009  . Atrial tachycardia, paroxysmal ([place]) 06/18/2016  . Bladder cancer ([place]) 08/16/2008   recurrence 2015 and 2018 treated with office fulguration  . CAD (coronary artery disease), native coronary artery    S/p 3v CABG 01/2014   . Cataract 2019   bilateral eyes  . Childhood asthma    as a child  . Complication of anesthesia    "takes me a year to get over"  . Contact dermatitis    atypical [name])  . Controlled diabetes mellitus type 2 with complications ([place]) 65/46/5035   CAD   . COPD (chronic obstructive pulmonary disease) ([place])   . Coronary artery disease    a.2015: CABG w/ LIMA-LAD, RIMA-OM, SVG-RCA b. Cath on 07/10/15 w/ patent LIMA-LAD and RIMA-OM. SVG-RCA occluded but collaterals present  . Diverticulosis 2013   severe by CT and colonoscopy  . Environmental allergies    improved as ages  . Fracture of lateral malleolus of left ankle 08/29/2013  . Headache    hasn't had migraines in years  . Hepatic steatosis 06/2015   by [place]  . Hx of migraines   . Hypertension   . Lactose intolerance 11/14/2018   Endorses for  years - avoids dairy products  . Lumbosacral radiculopathy at S1 03/2012   left, with spinal stenosis (MRI 04/2013) improved with TF ESI L5/S1 and S1/2 ([place])  . OSA (obstructive sleep apnea) 09/04/2014   Severe with AHI 35/hr, uses BIPAP  . Overweight (BMI 25.0-29.9) 01/26/2017  . Resistance to clopidogrel 2015   drug metabolism panel run - asked to scan  . Spinal stenosis    LS1 nerve root impingement from bulging disc  . Vitamin B12 deficiency   . Zenker diverticulum      FAMILY HISTORY:   Family History  Problem Relation Age of Onset  . Diabetes Mother   . Arrhythmia Mother        pacemaker  . COPD Mother   . Thyroid disease Mother   . Hyperlipidemia Mother   . Hypertension Mother   . Bladder Cancer Mother   . Diabetes Father   . CAD Father 68       CHF, MI  . Dementia Father   . Heart attack Father   . Diabetes Sister   . Heart attack Maternal Grandmother   . AAA (abdominal aortic aneurysm) Maternal Grandmother   . Colon cancer Neg Hx   . Stomach cancer Neg Hx     SOCIAL HISTORY:   Social History   Tobacco Use  . Smoking status: Former Smoker    Packs/day: 1.00  Years: 44.00    Pack years: 44.00    Types: Cigarettes    Start date: 73    Quit date: 02/05/2014    Years since quitting: 6.2  . Smokeless tobacco: Never Used  Substance Use Topics  . Alcohol use: Yes    Alcohol/week: 0.0 standard drinks    Comment: 3-4 drinks per month     ALLERGIES:   Allergies  Allergen Reactions  . Codeine Other (See Comments)    Nightmares  . Doxycycline Hives and Other (See Comments)    mild     CURRENT MEDICATIONS:   Current Outpatient Medications  Medication Sig Dispense Refill  . acetaminophen (TYLENOL) 325 MG tablet Take 2 tablets (650 mg total) by mouth every 6 (six) hours as needed for moderate pain or headache. 60 tablet 0  . albuterol (VENTOLIN HFA) 108 (90 Base) MCG/ACT inhaler TAKE 2 PUFFS BY MOUTH EVERY 6 HOURS AS NEEDED FOR WHEEZE OR  SHORTNESS OF BREATH 8.5 each 2  . amLODipine (NORVASC) 5 MG tablet Take 1 tablet (5 mg total) by mouth daily. 30 tablet 3  . ANORO ELLIPTA 62.5-25 MCG/INH AEPB TAKE 1 PUFF BY MOUTH EVERY DAY 180 each 1  . aspirin EC 81 MG tablet Take 1 tablet (81 mg total) by mouth daily. 90 tablet 1  . atorvastatin (LIPITOR) 20 MG tablet TAKE 1 TABLET (20 MG TOTAL) BY MOUTH DAILY AT 6 PM. 90 tablet 3  . Budeson-Glycopyrrol-Formoterol (BREZTRI AEROSPHERE) 160-9-4.8 MCG/ACT AERO Inhale 2 puffs into the lungs in the morning and at bedtime. 10.7 g 0  . cholecalciferol (VITAMIN D3) 25 MCG (1000 UT) tablet Take 1,000 Units by mouth daily.    . famotidine (PEPCID) 20 MG tablet TAKE 1 TABLET BY MOUTH TWICE A DAY 180 tablet 1  . glucose blood (ONE TOUCH ULTRA TEST) test strip CHECK ONCE DAILY AND AS NEEDED    . glucose blood test strip CHECK ONCE DAILY AND AS NEEDED    . ipratropium-albuterol (DUONEB) 0.5-2.5 (3) MG/3ML SOLN Take 3 mLs by nebulization every 6 (six) hours as needed. 360 mL 0  . ketotifen (ZADITOR) 0.025 % ophthalmic solution Place 1 drop into both eyes daily as needed (allergies). 5 mL 1  . metoprolol tartrate (LOPRESSOR) 25 MG tablet Take 1 tablet (25 mg total) by mouth 2 (two) times daily. 180 tablet 3  . oxyCODONE (OXY IR/ROXICODONE) 5 MG immediate release tablet Take 5 mg by mouth every 4 (four) hours as needed.    . pantoprazole (PROTONIX) 40 MG tablet Take 1 tablet (40 mg total) by mouth daily. 90 tablet 3  . vitamin B-12 (CYANOCOBALAMIN) 500 MCG tablet Take 1 tablet (500 mcg total) by mouth daily.     No current facility-administered medications for this visit.    REVIEW OF SYSTEMS:   [X]  denotes positive finding, [ ]  denotes negative finding Cardiac  Comments:  Chest pain or chest pressure:    Shortness of breath upon exertion: x   Short of breath when lying flat:    Irregular heart rhythm:        Vascular    Pain in calf, thigh, or hip brought on by ambulation:    Pain in feet at night  that wakes you up from your sleep:     Blood clot in your veins:    Leg swelling:         Pulmonary    Oxygen at home:    Productive cough:     Wheezing:  Neurologic    Sudden weakness in arms or legs:     Sudden numbness in arms or legs:     Sudden onset of difficulty speaking or slurred speech:    Temporary loss of vision in one eye:     Problems with dizziness:         Gastrointestinal    Blood in stool:     Vomited blood:         Genitourinary    Burning when urinating:     Blood in urine:        Psychiatric    Major depression:         Hematologic    Bleeding problems:    Problems with blood clotting too easily:        Skin    Rashes or ulcers:        Constitutional    Fever or chills:      PHYSICAL EXAM:   Vitals:   05/12/20 0815  BP: 138/85  Pulse: 62  Resp: 20  Temp: 98.8 F (37.1 C)  SpO2: 92%  Weight: 197 lb 12.8 oz (89.7 kg)  Height: 5\' 8"  (1.727 m)    GENERAL: The patient is a well-nourished male, in no acute distress. The vital signs are documented above. CARDIAC: There is a regular rate and rhythm.  VASCULAR: Palpable pedal pulses bilaterally PULMONARY: Non-labored respirations ABDOMEN: Soft and non-tender with normal pitched bowel sounds.  MUSCULOSKELETAL: There are no major deformities or cyanosis. NEUROLOGIC: No focal weakness or paresthesias are detected. SKIN: There are no ulcers or rashes noted. PSYCHIATRIC: The patient has a normal affect.  STUDIES:   I have reviewed the following U/S Abdominal Aorta: Patent EVAR with thrombus present and residual lumen of  0.66 x 1.35 cm. The largest aortic diameter has decreased compared to  prior exam. Previous diameter measurement was 4.0 cm AP obtained on  11/24/15. Maximum diameter is 3.3 cm  MEDICAL ISSUES:   AAA: Maximum aortic diameter today is 3.3 cm.  There is no evidence of endoleak.  He does have some thrombus within the aortic portion of this graft which is stable.  I  have recommended follow-up in 2 years with a repeat ultrasound.  He is moving to [place] so he is going to establish himself with a vascular surgeon down there.  On his last CT scan, there was a left ventricular apical pseudoaneurysm that was unchanged from 2013.  I have encouraged him to have this further evaluated by his cardiologist.    Leia Alf, MD, FACS Vascular and Vein Specialists of [facility] [phone] Pager [phone]

## 2020-05-13 DIAGNOSIS — G4733 Obstructive sleep apnea (adult) (pediatric): Secondary | ICD-10-CM | POA: Diagnosis not present

## 2020-05-13 DIAGNOSIS — J449 Chronic obstructive pulmonary disease, unspecified: Secondary | ICD-10-CM | POA: Diagnosis not present

## 2020-05-13 MED ORDER — BREZTRI AEROSPHERE 160-9-4.8 MCG/ACT IN AERO: 2.0000 | INHALATION_SPRAY | Freq: Two times a day (BID) | RESPIRATORY_TRACT | 5 refills | Status: DC

## 2020-05-13 MED ORDER — BREZTRI AEROSPHERE 160-9-4.8 MCG/ACT IN AERO: 2.0000 | INHALATION_SPRAY | Freq: Two times a day (BID) | RESPIRATORY_TRACT | 1 refills | Status: DC

## 2020-05-13 NOTE — Telephone Encounter (Signed)
Patient is returning phone call. Patient phone number is [phone].

## 2020-05-13 NOTE — Telephone Encounter (Signed)
Spoke with pt. He is needing a prescription sent in for [facility]. Pt requests a 90 day supply. Rx has been sent in. Nothing further was needed.

## 2020-06-03 ENCOUNTER — Encounter: Payer: Self-pay | Admitting: Pulmonary Disease

## 2020-06-03 ENCOUNTER — Ambulatory Visit: Payer: PPO | Admitting: Pulmonary Disease

## 2020-06-03 ENCOUNTER — Other Ambulatory Visit: Payer: Self-pay

## 2020-06-03 VITALS — BP 130/66 | HR 72 | Temp 98.0°F | Wt 197.1 lb

## 2020-06-03 DIAGNOSIS — J449 Chronic obstructive pulmonary disease, unspecified: Secondary | ICD-10-CM

## 2020-06-03 MED ORDER — AZITHROMYCIN 250 MG PO TABS: 250.0000 mg | ORAL_TABLET | Freq: Every day | ORAL | 4 refills | Status: DC

## 2020-06-03 NOTE — Patient Instructions (Addendum)
Shortness of breath Severe COPD with emphysema with chronic bronchitis CONTINUE Breztri TWO puffs TWICE a day CONTINUE Albuterol nebulizer and handheld as needed Start chronic macrolide therapy Hold on referral to Pulmonary Rehab due to patient moving to [place] contact his cardiologist regarding his persistent shortness of breath  Follow-up in 1 month

## 2020-06-03 NOTE — Progress Notes (Signed)
Subjective:   PATIENT ID: Carl Lee GENDER: male DOB: [birth year], MRN: [id number]   HPI  Chief Complaint  Patient presents with  . Follow-up    COPD--pt stated that his breathing has not been good. his oxygen levels have been around 94 and up but he still feels [facility]    Reason for Visit: Follow-up   [name] is a 64 year old male former smoker with moderate COPD, OSA on BiPAP, history of CAD status post three-vessel CABG, atrial tachycardia and AAA who presents for follow-up.  At our last visit, he was started on Breztri.  He has been compliant with his new inhaler.  He no longer wakes up at night from shortness of breath. The cost is a concern from him but he does perceive a benefit from the inhaler. He gets absolutely out of breath with any activity. He does not like to use his albuterol inhaler. Will recover after a few minutes and restart activity. He is active and walks 4 to 5 miles at least once a week and able to form yardwork.  He still has some productive cough and wheezing in the morning or with activity. He was last treated for COPD exacerbation x2 in September. No symptoms of exacerbation since the last visit.Has been compliant with his CPAP for his OSA which is followed by another provider.  Inhalers Tried Trelegy (thrush)  Social History: Former smoker. 44 pack- years.  I have personally reviewed patient's past medical/family/social history, allergies and current medications.  Past Medical History:  Diagnosis Date  . AAA (abdominal aortic aneurysm) ([place]) 03/25/12   s/p endovascular repair  . Adenomatous polyp 06/2009  . Atrial tachycardia, paroxysmal ([place]) 06/18/2016  . Bladder cancer ([place]) 08/16/2008   recurrence 2015 and 2018 treated with office fulguration  . CAD (coronary artery disease), native coronary artery    S/p 3v CABG 01/2014   . Cataract 2019   bilateral eyes  . Childhood asthma    as a child  . Complication of anesthesia    "takes me a  year to get over"  . Contact dermatitis    atypical [name])  . Controlled diabetes mellitus type 2 with complications ([place]) 84/16/6063   CAD   . COPD (chronic obstructive pulmonary disease) ([place])   . Coronary artery disease    a.2015: CABG w/ LIMA-LAD, RIMA-OM, SVG-RCA b. Cath on 07/10/15 w/ patent LIMA-LAD and RIMA-OM. SVG-RCA occluded but collaterals present  . Diverticulosis 2013   severe by CT and colonoscopy  . Environmental allergies    improved as ages  . Fracture of lateral malleolus of left ankle 08/29/2013  . Headache    hasn't had migraines in years  . Hepatic steatosis 06/2015   by [place]  . Hx of migraines   . Hypertension   . Lactose intolerance 11/14/2018   Endorses for years - avoids dairy products  . Lumbosacral radiculopathy at S1 03/2012   left, with spinal stenosis (MRI 04/2013) improved with TF ESI L5/S1 and S1/2 ([place])  . OSA (obstructive sleep apnea) 09/04/2014   Severe with AHI 35/hr, uses BIPAP  . Overweight (BMI 25.0-29.9) 01/26/2017  . Resistance to clopidogrel 2015   drug metabolism panel run - asked to scan  . Spinal stenosis    LS1 nerve root impingement from bulging disc  . Vitamin B12 deficiency   . Zenker diverticulum      Allergies  Allergen Reactions  . Codeine Other (See Comments)    Nightmares  .  Doxycycline Hives and Other (See Comments)    mild     Outpatient Medications Prior to Visit  Medication Sig Dispense Refill  . acetaminophen (TYLENOL) 325 MG tablet Take 2 tablets (650 mg total) by mouth every 6 (six) hours as needed for moderate pain or headache. 60 tablet 0  . albuterol (VENTOLIN HFA) 108 (90 Base) MCG/ACT inhaler TAKE 2 PUFFS BY MOUTH EVERY 6 HOURS AS NEEDED FOR WHEEZE OR SHORTNESS OF BREATH 8.5 each 2  . amLODipine (NORVASC) 5 MG tablet Take 1 tablet (5 mg total) by mouth daily. 30 tablet 3  . aspirin EC 81 MG tablet Take 1 tablet (81 mg total) by mouth daily. 90 tablet 1  . atorvastatin (LIPITOR) 20 MG tablet TAKE  1 TABLET (20 MG TOTAL) BY MOUTH DAILY AT 6 PM. 90 tablet 3  . Budeson-Glycopyrrol-Formoterol (BREZTRI AEROSPHERE) 160-9-4.8 MCG/ACT AERO Inhale 2 puffs into the lungs in the morning and at bedtime. 32.1 g 1  . cholecalciferol (VITAMIN D3) 25 MCG (1000 UT) tablet Take 1,000 Units by mouth daily.    . famotidine (PEPCID) 20 MG tablet TAKE 1 TABLET BY MOUTH TWICE A DAY 180 tablet 1  . glucose blood (ONE TOUCH ULTRA TEST) test strip CHECK ONCE DAILY AND AS NEEDED    . glucose blood test strip CHECK ONCE DAILY AND AS NEEDED    . ipratropium-albuterol (DUONEB) 0.5-2.5 (3) MG/3ML SOLN Take 3 mLs by nebulization every 6 (six) hours as needed. 360 mL 0  . ketotifen (ZADITOR) 0.025 % ophthalmic solution Place 1 drop into both eyes daily as needed (allergies). 5 mL 1  . metoprolol tartrate (LOPRESSOR) 25 MG tablet Take 1 tablet (25 mg total) by mouth 2 (two) times daily. 180 tablet 3  . oxyCODONE (OXY IR/ROXICODONE) 5 MG immediate release tablet Take 5 mg by mouth every 4 (four) hours as needed.    . pantoprazole (PROTONIX) 40 MG tablet Take 1 tablet (40 mg total) by mouth daily. 90 tablet 3  . vitamin B-12 (CYANOCOBALAMIN) 500 MCG tablet Take 1 tablet (500 mcg total) by mouth daily.    Jearl Klinefelter ELLIPTA 62.5-25 MCG/INH AEPB TAKE 1 PUFF BY MOUTH EVERY DAY (Patient not taking: Reported on 06/03/2020) 180 each 1   No facility-administered medications prior to visit.    Review of Systems  Constitutional: Negative for chills, diaphoresis, fever, malaise/fatigue and weight loss.  HENT: Negative for congestion.   Respiratory: Positive for cough, shortness of breath and wheezing. Negative for hemoptysis and sputum production.   Cardiovascular: Negative for chest pain, palpitations and leg swelling.   Objective:   Vitals:   06/03/20 0901  BP: 130/66  Pulse: 72  Temp: 98 F (36.7 C)  TempSrc: Tympanic  SpO2: 98%  Weight: 197 lb 2 oz (89.4 kg)   SpO2: 98 %   Physical Exam: General: Well-appearing, no  acute distress HENT: [place], AT Eyes: EOMI, no scleral icterus Respiratory: Clear to auscultation bilaterally.  No crackles, wheezing or rales Cardiovascular: RRR, -M/R/G, no JVD Extremities:-Edema,-tenderness Neuro: AAO x4, CNII-XII grossly intact Skin: Intact, no rashes or bruising Psych: Normal mood, normal affect  Data Reviewed:  Imaging: CT Chest Lung Screen 07/20/19 - Centrilobular and paraseptal emphysema. Tiny pulmonary nodules.  PFT: 11/29/17 FVC 3.0 (66%) FEV1 1.7 (49%) Ratio 56  Interpretation: Severe obstructive defect present  Labs: CBC    Component Value Date/Time   WBC 9.3 07/17/2019 0829   RBC 5.31 07/17/2019 0829   HGB 16.7 07/17/2019 0829   HGB  16.7 01/31/2019 0940   HCT 49.3 07/17/2019 0829   HCT 47.5 01/31/2019 0940   PLT 237.0 07/17/2019 0829   PLT 255 01/31/2019 0940   MCV 93.0 07/17/2019 0829   MCV 93 01/31/2019 0940   MCV 96 01/09/2014 1237   MCH 32.3 04/21/2019 0349   MCHC 33.8 07/17/2019 0829   RDW 14.8 07/17/2019 0829   RDW 12.6 01/31/2019 0940   RDW 13.5 01/09/2014 1237   LYMPHSABS 2.9 07/17/2019 0829   LYMPHSABS 2.8 01/31/2019 0940   MONOABS 0.8 07/17/2019 0829   EOSABS 0.5 07/17/2019 0829   EOSABS 0.1 01/31/2019 0940   BASOSABS 0.1 07/17/2019 0829   BASOSABS 0.1 01/31/2019 0940   Imaging, labs and tests noted above have been reviewed independently by me.    Assessment & Plan:   Discussion: 64 year old male former smoker with moderate COPD, OSA on BiPAP, history of CAD status post three-vessel CABG, atrial tachycardia and AAA who presents for follow-up. Although improved on triple therapy, he continues to have shortness of breath. Interested in Cardiology evaluation  Shortness of breath - uncontrolled Severe COPD with emphysema with chronic bronchitis CONTINUE Breztri TWO puffs TWICE a day CONTINUE Albuterol nebulizer and handheld as needed Start chronic macrolide therapy Hold on referral to Pulmonary Rehab due to patient moving to  [place] contact his cardiologist regarding his persistent shortness of breath  OSA on CPAP Followed by another provider. Planning to schedule an appointment  CT Lung Screen Due in 06/2020  Health Maintenance Immunization History  Administered Date(s) Administered  . Fluad Quad(high Dose 65+) 04/07/2020  . Influenza,inj,Quad PF,6+ Mos 04/17/2013, 06/17/2014, 05/01/2015, 03/25/2016, 04/01/2017, 07/14/2018, 03/22/2019  . PFIZER SARS-COV-2 Vaccination 04/14/2020, 05/05/2020  . Pneumococcal Conjugate-13 05/01/2015  . Pneumococcal Polysaccharide-23 06/17/2014, 08/07/2019  . Tdap 12/14/2012  . Zoster 12/23/2015   CT Lung Screen - Due 06/2020  No orders of the defined types were placed in this encounter.  Meds ordered this encounter  Medications  . azithromycin (ZITHROMAX) 250 MG tablet    Sig: Take 1 tablet (250 mg total) by mouth daily.    Dispense:  30 tablet    Refill:  4    Return in about 1 month (around 07/03/2020).  [place], MD [facility] 06/03/2020 9:52 AM  Office Number [phone]

## 2020-06-13 DIAGNOSIS — N3281 Overactive bladder: Secondary | ICD-10-CM | POA: Diagnosis not present

## 2020-06-13 DIAGNOSIS — Z8551 Personal history of malignant neoplasm of bladder: Secondary | ICD-10-CM | POA: Diagnosis not present

## 2020-06-13 DIAGNOSIS — N401 Enlarged prostate with lower urinary tract symptoms: Secondary | ICD-10-CM | POA: Diagnosis not present

## 2020-06-13 DIAGNOSIS — R3915 Urgency of urination: Secondary | ICD-10-CM | POA: Diagnosis not present

## 2020-06-18 ENCOUNTER — Ambulatory Visit: Payer: PPO | Admitting: Family Medicine

## 2020-06-19 NOTE — Progress Notes (Signed)
Cardiology Office Note    Date:  06/23/2020   ID:  Carl Lee, Carl Lee 13-Sep-1955, MRN [id number]  PCP:  [name], MD  Cardiologist: [name], MD EPS: None  No chief complaint on file.   History of Present Illness:   64 y.o. with CABG 2015 with Dr. [name] (L-LAD, RIMA-OM, S-PDA) AAA s/p endovascular repair in 9/13 by Dr. [name], HTN, HL, DM2, OSA on CPAP, tobacco abuse-quit 2020 and , recurrent bladder CA, carotid disease.    cath 02/11/17 for preop right hip surgery clearance. Stable with patent LIMA to mid LAD, RIMA to OM2 Occluded SVG PDA native RCA fills bridging collaterals EF 45-50% EF was normal by TTE done 11/11/15,    Chronic atypical chest wall pain intolerant to Imdur. On ranexa and capzasin cream to chest for chest wall pain.  Chronic dyspnea felt secondary to asthmatic COPD followed by Dr. [name].    Cardiac cath 02/02/19 patent LIMA to LAD, patent RIMA to OM, total SVG to the distal RCA, total proximal LAD, origin to proximal graft lesion is 100% stenosed, total proximal RCA distal RCA fills by collaterals from first septal perforator.  Mild mid to distal anterior wall hypokinesis EF 45 to 50%.  No change from 2 years ago.  Patient has had chronic chest pain since bypass surgery. Referred to [facility] for Zenkers surgery   04/19/19:  Laparoscopic cholecystectomy with Dr [name] 06/21/19 had endoscopy with myotomy for Zenkers and Schatzki Ring Dysphagia And problems swallowing mostly better   Last visit Norvasc d/c due to low BP 12/12/19   Seen by [facility] Dr [name] 05/12/20 AAA 3.3 cm no endoleak did not recommend anticoagulation for thrombus in body Of aortic graft   Moving to [place] to be near his two boys. Still with some dyspnea and palpitations with rapid heart beats   Past Medical History:  Diagnosis Date  . AAA (abdominal aortic aneurysm) ([place]) 03/25/12   s/p endovascular repair  . Adenomatous polyp 06/2009  . Atrial tachycardia, paroxysmal ([place])  06/18/2016  . Bladder cancer ([place]) 08/16/2008   recurrence 2015 and 2018 treated with office fulguration  . CAD (coronary artery disease), native coronary artery    S/p 3v CABG 01/2014   . Cataract 2019   bilateral eyes  . Childhood asthma    as a child  . Complication of anesthesia    "takes me a year to get over"  . Contact dermatitis    atypical [name])  . Controlled diabetes mellitus type 2 with complications ([place]) 63/14/9702   CAD   . COPD (chronic obstructive pulmonary disease) ([place])   . Coronary artery disease    a.2015: CABG w/ LIMA-LAD, RIMA-OM, SVG-RCA b. Cath on 07/10/15 w/ patent LIMA-LAD and RIMA-OM. SVG-RCA occluded but collaterals present  . Diverticulosis 2013   severe by CT and colonoscopy  . Environmental allergies    improved as ages  . Fracture of lateral malleolus of left ankle 08/29/2013  . Headache    hasn't had migraines in years  . Hepatic steatosis 06/2015   by [place]  . Hx of migraines   . Hypertension   . Lactose intolerance 11/14/2018   Endorses for years - avoids dairy products  . Lumbosacral radiculopathy at S1 03/2012   left, with spinal stenosis (MRI 04/2013) improved with TF ESI L5/S1 and S1/2 ([place])  . OSA (obstructive sleep apnea) 09/04/2014   Severe with AHI 35/hr, uses BIPAP  . Overweight (BMI 25.0-29.9) 01/26/2017  . Resistance to  clopidogrel 2015   drug metabolism panel run - asked to scan  . Spinal stenosis    LS1 nerve root impingement from bulging disc  . Vitamin B12 deficiency   . Zenker diverticulum     Past Surgical History:  Procedure Laterality Date  . ABDOMINAL AORTIC ANEURYSM REPAIR  03/25/12   endovascular  . ABI  05/2013   WNL, L TBI low at 0.66  . BALLOON DILATION N/A 04/20/2019   Procedure: BALLOON DILATION;  Surgeon: [name], MD;  Location: [facility] ENDOSCOPY;  Service: Endoscopy;  Laterality: N/A;  pyloric   . BILIARY DILATION  04/20/2019   Procedure: BILIARY DILATION;  Surgeon: [name], MD;  Location:  [facility] ENDOSCOPY;  Service: Endoscopy;;  . Bladder cancer  March 2010 and Oct. 2015   Ernst Spell) x 2  . CARDIAC CATHETERIZATION N/A 07/10/2015   Procedure: Left Heart Cath and Cors/Grafts Angiography;  Surgeon: [name], MD;  Location: [place] CV LAB;  Service: Cardiovascular;  Laterality: N/A;  . CATARACT EXTRACTION W/ INTRAOCULAR LENS IMPLANT Bilateral 07/2018, 08/2018  . CHOLECYSTECTOMY N/A 04/19/2019   Procedure: LAPAROSCOPIC CHOLECYSTECTOMY;  Surgeon: [name], MD;  Location: [place];  Service: General;  Laterality: N/A;  . COLONOSCOPY  06/2012   hyperplastic polyp, diverticulosis (jacobs) rec rpt 5 yrs  . COLONOSCOPY WITH PROPOFOL N/A 08/10/2017   TA, rpt 5 yrs Ardis Hughs, Melene Plan, MD)  . CORONARY ARTERY BYPASS GRAFT N/A 02/07/2014   Procedure: CORONARY ARTERY BYPASS GRAFTING (CABG);  Surgeon: [name], MD;  Location: [place];  Service: Open Heart Surgery;  Laterality: N/A;  CABG X 3, BILATERAL LIMA, EVH  . CYSTOSCOPY  08/16/08   Bladder Cancer  . EPIDURAL BLOCK INJECTION Left 09/2014, 10/2014, 12/2014   medial L2,3,4, dorsal L5 ramus blocks x2, L L5/S1 and S1/2 transforaminal ESI ([place])  . ERCP N/A 04/20/2019   Procedure: ENDOSCOPIC RETROGRADE CHOLANGIOPANCREATOGRAPHY (ERCP);  Surgeon: [name], MD;  Location: [facility] ENDOSCOPY;  Service: Endoscopy;  Laterality: N/A;  . ESI  04/2013, 06/2013   L L5S1, S12 transforaminal ESI (Dr.  [name])  . ESI Left 04/2014, 05/2014, 06/2014   L5/S1, S1/2; rpt; L4/5  . ESI  03/2016   R L5/S1 interlaminar ESI  . ESOPHAGEAL MANOMETRY N/A 08/10/2017   Procedure: ESOPHAGEAL MANOMETRY (EM);  Surgeon: [name], MD;  Location: [facility] ENDOSCOPY;  Service: Endoscopy;  Laterality: N/A;  . ESOPHAGOGASTRODUODENOSCOPY (EGD) WITH PROPOFOL N/A 08/10/2017   Procedure: ESOPHAGOGASTRODUODENOSCOPY (EGD) WITH PROPOFOL;  Surgeon: [name], MD;  Location: [facility] ENDOSCOPY;  Service: Endoscopy;  Laterality: N/A;  . FULGURATION OF  BLADDER TUMOR  01/2017   recurrent 68mm L lateral wall papillary transitional cell carcinoma ([place])  . INTRAOPERATIVE CHOLANGIOGRAM N/A 04/19/2019   Procedure: Intraoperative Cholangiogram;  Surgeon: [name], MD;  Location: [place];  Service: General;  Laterality: N/A;  . INTRAOPERATIVE TRANSESOPHAGEAL ECHOCARDIOGRAM N/A 02/07/2014   Procedure: INTRAOPERATIVE TRANSESOPHAGEAL ECHOCARDIOGRAM;  Surgeon: [name], MD;  Location: [place];  Service: Open Heart Surgery;  Laterality: N/A;  . LEFT HEART CATH AND CORS/GRAFTS ANGIOGRAPHY N/A 02/11/2017   Procedure: LEFT HEART CATH AND CORS/GRAFTS ANGIOGRAPHY;  Surgeon: [name], MD;  Location: [place] CV LAB;  Service: Cardiovascular;  Laterality: N/A;  . LEFT HEART CATH AND CORS/GRAFTS ANGIOGRAPHY N/A 02/02/2019   Procedure: LEFT HEART CATH AND CORS/GRAFTS ANGIOGRAPHY;  Surgeon: Belva Crome, MD;  Location: [place] CV LAB;  Service: Cardiovascular;  Laterality: N/A;  . LEFT HEART CATHETERIZATION  WITH CORONARY ANGIOGRAM N/A 02/01/2014   Procedure: LEFT HEART CATHETERIZATION WITH CORONARY ANGIOGRAM;  Surgeon: [name], MD;  Location: [facility] CATH LAB;  Service: Cardiovascular;  Laterality: N/A;  . peroral endoscopic myotomy  06/2019   EGD - treated zenker's diverticulum, patent shartzki ring, irregular Z line, small HH (Dr Harl Bowie at [facility])  . PRP epidural injection Left 11/2015   L5/S1, S1/2 transforaminal epidural PRP injections under fluoroscopy ([place])  . REMOVAL OF STONES  04/20/2019   Procedure: REMOVAL OF STONES;  Surgeon: [name], MD;  Location: [facility] ENDOSCOPY;  Service: Endoscopy;;  . REPLACEMENT TOTAL HIP W/  RESURFACING IMPLANTS Left   . SPHINCTEROTOMY  04/20/2019   Procedure: SPHINCTEROTOMY;  Surgeon: [name], MD;  Location: [facility] ENDOSCOPY;  Service: Endoscopy;;  . [place]  . TOTAL HIP ARTHROPLASTY Left 03/08/2017  . TOTAL HIP ARTHROPLASTY Left 03/08/2017    Procedure: LEFT TOTAL HIP ARTHROPLASTY ANTERIOR APPROACH;  Surgeon: [name], MD;  Location: [place];  Service: Orthopedics;  Laterality: Left;  [name] VASECTOMY    . ZENKER'S DIVERTICULECTOMY  09/2017  . ZENKER'S DIVERTICULECTOMY ENDOSCOPIC  06/2019   [facility] at [facility]    Current Medications: Current Meds  Medication Sig  . acetaminophen (TYLENOL) 325 MG tablet Take 2 tablets (650 mg total) by mouth every 6 (six) hours as needed for moderate pain or headache.  . albuterol (VENTOLIN HFA) 108 (90 Base) MCG/ACT inhaler TAKE 2 PUFFS BY MOUTH EVERY 6 HOURS AS NEEDED FOR WHEEZE OR SHORTNESS OF BREATH  . amLODipine (NORVASC) 5 MG tablet Take 1 tablet (5 mg total) by mouth daily.  [name] aspirin EC 81 MG tablet Take 1 tablet (81 mg total) by mouth daily.  [name] atorvastatin (LIPITOR) 20 MG tablet TAKE 1 TABLET (20 MG TOTAL) BY MOUTH DAILY AT 6 PM.  . azithromycin (ZITHROMAX) 250 MG tablet Take 1 tablet (250 mg total) by mouth daily.  . Budeson-Glycopyrrol-Formoterol (BREZTRI AEROSPHERE) 160-9-4.8 MCG/ACT AERO Inhale 2 puffs into the lungs in the morning and at bedtime.  . cholecalciferol (VITAMIN D3) 25 MCG (1000 UT) tablet Take 1,000 Units by mouth daily.  . famotidine (PEPCID) 20 MG tablet TAKE 1 TABLET BY MOUTH TWICE A DAY  . glucose blood test strip CHECK ONCE DAILY AND AS NEEDED  . glucose blood test strip CHECK ONCE DAILY AND AS NEEDED  . ipratropium-albuterol (DUONEB) 0.5-2.5 (3) MG/3ML SOLN Take 3 mLs by nebulization every 6 (six) hours as needed.  [name] ketotifen (ZADITOR) 0.025 % ophthalmic solution Place 1 drop into both eyes daily as needed (allergies).  . metoprolol tartrate (LOPRESSOR) 25 MG tablet Take 1 tablet (25 mg total) by mouth 2 (two) times daily.  [name] oxyCODONE (OXY IR/ROXICODONE) 5 MG immediate release tablet Take 5 mg by mouth every 4 (four) hours as needed.  . pantoprazole (PROTONIX) 40 MG tablet Take 1 tablet (40 mg total) by mouth daily.  . vitamin B-12 (CYANOCOBALAMIN) 500 MCG  tablet Take 1 tablet (500 mcg total) by mouth daily.     Allergies:   Codeine and Doxycycline   Social History   Socioeconomic History  . Marital status: Married    Spouse name: Not on file  . Number of children: 2  . Years of education: Not on file  . Highest education level: Not on file  Occupational History  . Occupation: Self Employed  Tobacco Use  . Smoking status: Former Smoker    Packs/day: 1.00    Years: 44.00  Pack years: 44.00    Types: Cigarettes    Start date: 26    Quit date: 02/05/2014    Years since quitting: 6.3  . Smokeless tobacco: Never Used  Vaping Use  . Vaping Use: Never used  Substance and Sexual Activity  . Alcohol use: Yes    Alcohol/week: 0.0 standard drinks    Comment: 3-4 drinks per month  . Drug use: No  . Sexual activity: Not Currently  Other Topics Concern  . Not on file  Social History Narrative   Caffeine: 1 cup coffee/day   Lives with wife, 1 dog   grown children   Occupation: Therapist, sports - self employed.  Disability after CABG   Edu: 2 yrs college   Activity: fishing, walking occasionally   Diet: good water, fruits/vegetables daily   Social Determinants of Health   Financial Resource Strain: Low Risk   . Difficulty of Paying Living Expenses: Not very hard  Food Insecurity: Not on file  Transportation Needs: Not on file  Physical Activity: Not on file  Stress: Not on file  Social Connections: Not on file     Family History:  The patient's family history includes AAA (abdominal aortic aneurysm) in his maternal grandmother; Arrhythmia in his mother; Bladder Cancer in his mother; CAD (age of onset: 41) in his father; COPD in his mother; Dementia in his father; Diabetes in his father, mother, and sister; Heart attack in his father and maternal grandmother; Hyperlipidemia in his mother; Hypertension in his mother; Thyroid disease in his mother.   ROS:   Please see the history of present illness.    ROS All other  systems reviewed and are negative.   PHYSICAL EXAM:   VS:  BP 110/60   Pulse (!) 55   Ht 5\' 8"  (1.727 m)   Wt 87.5 kg   SpO2 92%   BMI 29.35 kg/m   Physical Exam   Affect appropriate Healthy:  appears stated age 1: normal Neck supple with no adenopathy JVP normal no bruits no thyromegaly Lungs clear with no wheezing and good diaphragmatic motion Heart:  S1/S2 no murmur, no rub, gallop or click PMI normal post sternotomy  Abdomen: benighn, BS positve, no tenderness, no AAA no bruit.  No HSM or HJR Distal pulses intact with no bruits No edema Neuro non-focal Skin warm and dry No muscular weakness    Wt Readings from Last 3 Encounters:  06/23/20 87.5 kg  06/03/20 89.4 kg  05/12/20 89.7 kg      Studies/Labs Reviewed:   EKG:     04/20/19 SR rate 91 nonspecific ST changes  Recent Labs: 07/17/2019: ALT 8; BUN 13; Creatinine, Ser 0.74; Hemoglobin 16.7; Platelets 237.0; Potassium 4.4; Sodium 139; TSH 2.37   Lipid Panel    Component Value Date/Time   CHOL 97 07/17/2019 0829   CHOL 170 01/05/2012 0000   TRIG 117.0 07/17/2019 0829   TRIG 141 01/05/2012 0000   HDL 37.50 (L) 07/17/2019 0829   CHOLHDL 3 07/17/2019 0829   VLDL 23.4 07/17/2019 0829   LDLCALC 36 07/17/2019 0829   LDLDIRECT 62.0 12/28/2017 0827    Additional studies/ records that were reviewed today include:  Cardiac catheterization 02/02/2019  Origin to Prox Graft lesion is 100% stenosed.    Patent left internal mammary to LAD  Patent right internal mammary to second obtuse marginal  Totally occluded saphenous vein graft to the distal RCA.  Total occlusion of proximal LAD native vessel.  Aneurysm involvement of the  proximal circumflex with patent obtuse marginal branches.  Total occlusion of the proximal RCA.  Distal RCA fills by collaterals from first septal perforator.  Mild mid to distal anterior wall hypokinesis.  EF 45 to 50%.  Normal LV hemodynamics.  Overall, when compared to 2  years ago there is no change in anatomy.  Patient has had chronic chest pain since bypass surgery.  Current symptoms are no different than what he has had since bypass surgery.   RECOMMENDATIONS:    As noted above, anatomy is stable.  Chest pain symptoms are chronic and non-ischemic or stable ischemic.  Cleared for upcoming surgery from coronary artery disease and left ventricular function standpoint.      ASSESSMENT:    CAD/CABG  PLAN:    CAD status post CABG in 2015, With cardiac catheterization 02/02/2019 basically unchanged from 2 years ago. Continue medical Rx EF preserved by echo 01/31/19 but ? Apical pseudo-aneurysm AT cath 02/02/19 Dr Tamala Julian noted distal anterior wall hypokinesis EF 45-50% and no apical aneurysm Discussed cardiac MRI to further evaluate apex but I'm not sure finding a chronic aneurysm/pseudo would change his Rx Also discussed TTE with definity with focus on apex     HTN:  Low BP improved off norvasc   HLD :  07/17/19 LDL at goal 36 continue statin   PVD:   AAA endovascular repair in 2013 f/u [facility] likely needs updated imaging 04/17/19 had some mural thrombus in the aortic portion of graft   OSA on CPAP followed by Dr. Radford Pax compliant   GI:  F/u at [facility] for Zenkers repair and dysphagia with difficulty swallowing  Palpitations:  48 hour holter ordered r/o PAF   Echo with definity apical pseudoaneurysm 48 Hour Zio palpitations   Medication Adjustments/Labs and Tests Ordered: Current medicines are reviewed at length with the patient today.  Concerns regarding medicines are outlined above.  Medication changes, Labs and Tests ordered today are listed in the Patient Instructions below. Patient Instructions  Medication Instructions:  *If you need a refill on your cardiac medications before your next appointment, please call your pharmacy*  Lab Work: If you have labs (blood work) drawn today and your tests are completely normal, you will receive your results  only by: [name] MyChart Message (if you have MyChart) OR . A paper copy in the mail If you have any lab test that is abnormal or we need to change your treatment, we will call you to review the results.  Testing/Procedures: Your physician has requested that you have an echocardiogram as soon as possible. Echocardiography is a painless test that uses sound waves to create images of your heart. It provides your doctor with information about the size and shape of your heart and how well your heart's chambers and valves are working. This procedure takes approximately one hour. There are no restrictions for this procedure.  Your physician has recommended that you wear a zio patch monitor. Holter monitors are medical devices that record the heart's electrical activity. Doctors most often use these monitors to diagnose arrhythmias. Arrhythmias are problems with the speed or rhythm of the heartbeat. The monitor is a small, portable device. You can wear one while you do your normal daily activities. This is usually used to diagnose what is causing palpitations/syncope (passing out).  Follow-Up: At [facility], you and your health needs are our priority.  As part of our continuing mission to provide you with exceptional heart care, we have created designated Provider Care Teams.  These Care Teams include your primary Cardiologist (physician) and Advanced Practice Providers (APPs -  Physician Assistants and Nurse Practitioners) who all work together to provide you with the care you need, when you need it.  We recommend signing up for the patient portal called "MyChart".  Sign up information is provided on this After Visit Summary.  MyChart is used to connect with patients for Virtual Visits (Telemedicine).  Patients are able to view lab/test results, encounter notes, upcoming appointments, etc.  Non-urgent messages can be sent to your provider as well.   To learn more about what you can do with MyChart, go to  [email or web address].    Your next appointment:   6 month(s)  The format for your next appointment:   In Person  Provider:   You may see [name], MD or one of the following Advanced Practice Providers on your designated Care Team:    Truitt Merle, NP  Cecilie Kicks, NP  Kathyrn Drown, NP       Signed, [name], MD  06/23/2020 9:48 AM    [address], [place], [place]  [address] Phone: [phone]; Fax: [phone]

## 2020-06-23 ENCOUNTER — Other Ambulatory Visit: Payer: Self-pay

## 2020-06-23 ENCOUNTER — Ambulatory Visit: Payer: PPO | Admitting: Cardiovascular Disease

## 2020-06-23 ENCOUNTER — Encounter: Payer: Self-pay | Admitting: Cardiovascular Disease

## 2020-06-23 VITALS — BP 110/60 | HR 55 | Ht 68.0 in | Wt 193.0 lb

## 2020-06-23 DIAGNOSIS — R002 Palpitations: Secondary | ICD-10-CM

## 2020-06-23 DIAGNOSIS — I719 Aortic aneurysm of unspecified site, without rupture: Secondary | ICD-10-CM

## 2020-06-23 DIAGNOSIS — R0600 Dyspnea, unspecified: Secondary | ICD-10-CM

## 2020-06-23 DIAGNOSIS — Z951 Presence of aortocoronary bypass graft: Secondary | ICD-10-CM | POA: Diagnosis not present

## 2020-06-23 DIAGNOSIS — R0609 Other forms of dyspnea: Secondary | ICD-10-CM

## 2020-06-23 NOTE — Patient Instructions (Addendum)
Medication Instructions:  *If you need a refill on your cardiac medications before your next appointment, please call your pharmacy*  Lab Work: If you have labs (blood work) drawn today and your tests are completely normal, you will receive your results only by: [name] MyChart Message (if you have MyChart) OR . A paper copy in the mail If you have any lab test that is abnormal or we need to change your treatment, we will call you to review the results.  Testing/Procedures: Your physician has requested that you have an echocardiogram as soon as possible. Echocardiography is a painless test that uses sound waves to create images of your heart. It provides your doctor with information about the size and shape of your heart and how well your heart's chambers and valves are working. This procedure takes approximately one hour. There are no restrictions for this procedure.  Your physician has recommended that you wear a zio patch monitor. Holter monitors are medical devices that record the heart's electrical activity. Doctors most often use these monitors to diagnose arrhythmias. Arrhythmias are problems with the speed or rhythm of the heartbeat. The monitor is a small, portable device. You can wear one while you do your normal daily activities. This is usually used to diagnose what is causing palpitations/syncope (passing out).  Follow-Up: At [facility], you and your health needs are our priority.  As part of our continuing mission to provide you with exceptional heart care, we have created designated Provider Care Teams.  These Care Teams include your primary Cardiologist (physician) and Advanced Practice Providers (APPs -  Physician Assistants and Nurse Practitioners) who all work together to provide you with the care you need, when you need it.  We recommend signing up for the patient portal called "MyChart".  Sign up information is provided on this After Visit Summary.  MyChart is used to connect with  patients for Virtual Visits (Telemedicine).  Patients are able to view lab/test results, encounter notes, upcoming appointments, etc.  Non-urgent messages can be sent to your provider as well.   To learn more about what you can do with MyChart, go to [email or web address].    Your next appointment:   As needed  The format for your next appointment:   In Person  Provider:   You may see [name], MD or one of the following Advanced Practice Providers on your designated Care Team:    Truitt Merle, NP  Cecilie Kicks, NP  Kathyrn Drown, NP

## 2020-06-24 ENCOUNTER — Telehealth: Payer: Self-pay | Admitting: Cardiovascular Disease

## 2020-06-24 NOTE — Telephone Encounter (Signed)
Returned call to patient explained there was a mixup in the original monitor enrollment and he was only suppose to wear for 48hr not 30 days. Carl Lee the [employer] 30 day monitor.   Patient is concerned about his insurance not covering the monitor because his ends at the end of they year. I explained monitor is billed by the date it is processed by [employer] and the interpretation charge is by the date [name] reads his monitor. He is out of town and doesn't want to start monitor till the 22nd so we cant guarantee it will not be in 2022. [place]. He declined and stated he will wait until he finds a cardiologist where he is moving.

## 2020-06-24 NOTE — Telephone Encounter (Signed)
Patient calling back. He states he got a robo call telling him to go ahead and turn the monitor on, but the patient states he has not received the monitor. He states he was told when he checked out that he could pick up the heart monitor in the office on the 07/02/2020. Patient would like a call back soon and is concerned about getting charged for a 30 day monitor.

## 2020-06-24 NOTE — Telephone Encounter (Signed)
Patient is suppose to have a 2 week zio patch monitor, but will only be wearing it for 48 hours. Patient is concerned about his insurance not covering this since his insurance ends at the end of the year. Will forward to monitor techs for advisement and ask for them to call patient with recommendations.

## 2020-06-24 NOTE — Telephone Encounter (Signed)
Patient states his heart monitor was supposed to be for 48 hrs but it was put in for 30 day. He states this won't work for his insurance.

## 2020-06-25 NOTE — Telephone Encounter (Signed)
Patient states he just got a call today stating that he has received his heart monitor. He wants to know what is going on because he has declined wanting this heart monitor.

## 2020-07-01 ENCOUNTER — Encounter: Payer: Self-pay | Admitting: Pulmonary Disease

## 2020-07-01 ENCOUNTER — Ambulatory Visit (INDEPENDENT_AMBULATORY_CARE_PROVIDER_SITE_OTHER): Payer: PPO

## 2020-07-01 ENCOUNTER — Ambulatory Visit: Payer: PPO | Admitting: Pulmonary Disease

## 2020-07-01 ENCOUNTER — Other Ambulatory Visit: Payer: Self-pay

## 2020-07-01 VITALS — BP 118/70 | HR 65 | Temp 97.5°F | Ht 68.0 in | Wt 193.1 lb

## 2020-07-01 DIAGNOSIS — R0602 Shortness of breath: Secondary | ICD-10-CM

## 2020-07-01 DIAGNOSIS — J439 Emphysema, unspecified: Secondary | ICD-10-CM | POA: Diagnosis not present

## 2020-07-01 DIAGNOSIS — J449 Chronic obstructive pulmonary disease, unspecified: Secondary | ICD-10-CM

## 2020-07-01 NOTE — Patient Instructions (Addendum)
Severe COPD with emphysema with chronic bronchitis CONTINUE Breztri TWO puffs TWICE a day CONTINUE Albuterol nebulizer and handheld as needed CONTINUE chronic macrolide therapy Hold on referral to Pulmonary Rehab due to patient moving to [employer]. However patient aware that I encourage to start this as soon as possible Arrange for PFTs   Exertional Hypoxemia - secondary to V/Q mismatch START supplemental oxygen. Wear 2L with activity and sleep for goal saturations >88%  Follow-up in 2 months with me after PFTs

## 2020-07-01 NOTE — Progress Notes (Signed)
Subjective:   PATIENT ID: Carl Lee GENDER: male DOB: April 13, 1956, MRN: [id number]   HPI  Chief Complaint  Patient presents with  . Follow-up    Breathing has not been good.  Scheduled for echo tomorrow. HR 190 last night. Oxygen levels were as low as 95%.  When this was going on he was out of breath. Oxygen level this morning was 92-93% and feels now that while he is talking he is getting out of breath    Reason for Visit: Follow-up   Carl Lee is a 64 year old male former smoker with moderate COPD, OSA on BiPAP, history of CAD status post three-vessel CABG, atrial tachycardia and AAA who presents for one month follow-up.  Since our last visit one month ago, he was started on daily macrolide therapy in addition to his Breztri. He is compliant with therapy. He was also seen by Cardiology (note from 06/23/20 by Dr. Eden Emms reviewed) who recommended medical management with plan for echocardiogram and Zio for arrhythmia monitoring. Since starting macrolide, he reports improved cough and chest tightness. Able to take deeper breaths however does notice bilateral lower chest pain at times that seem to limit his breathing. He feels his tachycardia poses a major issue when performing activity and this worsens his shortness of breath. Rarely uses his albuterol and has not recently used the inhaler prior to his tachycardia episodes.  Inhalers Tried Trelegy (thrush)  Social History: Former smoker. 44 pack- years.  I have personally reviewed patient's past medical/family/social history/allergies/current medications.  Past Medical History:  Diagnosis Date  . AAA (abdominal aortic aneurysm) (HCC) 03/25/12   s/p endovascular repair  . Adenomatous polyp 06/2009  . Atrial tachycardia, paroxysmal (HCC) 06/18/2016  . Bladder cancer (HCC) 08/16/2008   recurrence 2015 and 2018 treated with office fulguration  . CAD (coronary artery disease), native coronary artery    S/p 3v CABG 01/2014   .  Cataract 2019   bilateral eyes  . Childhood asthma    as a child  . Complication of anesthesia    "takes me a year to get over"  . Contact dermatitis    atypical [facility] [place])  . Controlled diabetes mellitus type 2 with complications (HCC) 06/08/2014   CAD   . COPD (chronic obstructive pulmonary disease) (HCC)   . Coronary artery disease    a.2015: CABG w/ LIMA-LAD, RIMA-OM, SVG-RCA b. Cath on 07/10/15 w/ patent LIMA-LAD and RIMA-OM. SVG-RCA occluded but collaterals present  . Diverticulosis 2013   severe by CT and colonoscopy  . Environmental allergies    improved as ages  . Fracture of lateral malleolus of left ankle 08/29/2013  . Headache    hasn't had migraines in years  . Hepatic steatosis 06/2015   by [place]  . Hx of migraines   . Hypertension   . Lactose intolerance 11/14/2018   Endorses for years - avoids dairy products  . Lumbosacral radiculopathy at S1 03/2012   left, with spinal stenosis (MRI 04/2013) improved with TF ESI L5/S1 and S1/2 ([employer] [employer])  . OSA (obstructive sleep apnea) 09/04/2014   Severe with AHI 35/hr, uses BIPAP  . Overweight (BMI 25.0-29.9) 01/26/2017  . Resistance to clopidogrel 2015   drug metabolism panel run - asked to scan  . Spinal stenosis    LS1 nerve root impingement from bulging disc  . Vitamin B12 deficiency   . Zenker diverticulum      Allergies  Allergen Reactions  . Codeine Other (See Comments)  Nightmares  . Doxycycline Hives and Other (See Comments)    mild     Outpatient Medications Prior to Visit  Medication Sig Dispense Refill  . acetaminophen (TYLENOL) 325 MG tablet Take 2 tablets (650 mg total) by mouth every 6 (six) hours as needed for moderate pain or headache. 60 tablet 0  . albuterol (VENTOLIN HFA) 108 (90 Base) MCG/ACT inhaler TAKE 2 PUFFS BY MOUTH EVERY 6 HOURS AS NEEDED FOR WHEEZE OR SHORTNESS OF BREATH 8.5 each 2  . amLODipine (NORVASC) 5 MG tablet Take 1 tablet (5 mg total) by mouth daily. 30 tablet 3  . aspirin EC  81 MG tablet Take 1 tablet (81 mg total) by mouth daily. 90 tablet 1  . atorvastatin (LIPITOR) 20 MG tablet TAKE 1 TABLET (20 MG TOTAL) BY MOUTH DAILY AT 6 PM. 90 tablet 3  . azithromycin (ZITHROMAX) 250 MG tablet Take 1 tablet (250 mg total) by mouth daily. 30 tablet 4  . Budeson-Glycopyrrol-Formoterol (BREZTRI AEROSPHERE) 160-9-4.8 MCG/ACT AERO Inhale 2 puffs into the lungs in the morning and at bedtime. 32.1 g 1  . cholecalciferol (VITAMIN D3) 25 MCG (1000 UT) tablet Take 1,000 Units by mouth daily.    . famotidine (PEPCID) 20 MG tablet TAKE 1 TABLET BY MOUTH TWICE A DAY 180 tablet 1  . glucose blood test strip CHECK ONCE DAILY AND AS NEEDED    . glucose blood test strip CHECK ONCE DAILY AND AS NEEDED    . ipratropium-albuterol (DUONEB) 0.5-2.5 (3) MG/3ML SOLN Take 3 mLs by nebulization every 6 (six) hours as needed. 360 mL 0  . ketotifen (ZADITOR) 0.025 % ophthalmic solution Place 1 drop into both eyes daily as needed (allergies). 5 mL 1  . metoprolol tartrate (LOPRESSOR) 25 MG tablet Take 1 tablet (25 mg total) by mouth 2 (two) times daily. 180 tablet 3  . oxyCODONE (OXY IR/ROXICODONE) 5 MG immediate release tablet Take 5 mg by mouth every 4 (four) hours as needed.    . pantoprazole (PROTONIX) 40 MG tablet Take 1 tablet (40 mg total) by mouth daily. 90 tablet 3  . vitamin B-12 (CYANOCOBALAMIN) 500 MCG tablet Take 1 tablet (500 mcg total) by mouth daily.     No facility-administered medications prior to visit.    Review of Systems  Constitutional: Negative for chills, diaphoresis, fever, malaise/fatigue and weight loss.  HENT: Negative for congestion.   Respiratory: Positive for shortness of breath. Negative for cough, hemoptysis, sputum production and wheezing.   Cardiovascular: Negative for chest pain, palpitations and leg swelling.   Objective:   Vitals:   07/01/20 0913  BP: 118/70  Pulse: 65  Temp: (!) 97.5 F (36.4 C)  TempSrc: Tympanic  SpO2: 95%  Weight: 193 lb 2 oz (87.6  kg)  Height: 5\' 8"  (1.727 m)   SpO2: 95 %   Physical Exam: General: Well-appearing, no acute distress HENT: [place], AT Eyes: EOMI, no scleral icterus Respiratory: Diminished breath sounds bilaterally. No crackles, wheezing or rales Cardiovascular: RRR, -M/R/G, no JVD GI: BS+, soft, nontender Extremities:-Edema,-tenderness Neuro: AAO x4, CNII-XII grossly intact Skin: Intact, no rashes or bruising Psych: Normal mood, normal affect  Data Reviewed:  Imaging: CT Chest Lung Screen 07/20/19 - Centrilobular and paraseptal emphysema. Tiny pulmonary nodules.  PFT: 11/29/17 FVC 3.0 (66%) FEV1 1.7 (49%) Ratio 56  Interpretation: Severe obstructive defect present  Labs: CBC    Component Value Date/Time   WBC 9.3 07/17/2019 0829   RBC 5.31 07/17/2019 0829   HGB 16.7 07/17/2019  0829   HGB 16.7 01/31/2019 0940   HCT 49.3 07/17/2019 0829   HCT 47.5 01/31/2019 0940   PLT 237.0 07/17/2019 0829   PLT 255 01/31/2019 0940   MCV 93.0 07/17/2019 0829   MCV 93 01/31/2019 0940   MCV 96 01/09/2014 1237   MCH 32.3 04/21/2019 0349   MCHC 33.8 07/17/2019 0829   RDW 14.8 07/17/2019 0829   RDW 12.6 01/31/2019 0940   RDW 13.5 01/09/2014 1237   LYMPHSABS 2.9 07/17/2019 0829   LYMPHSABS 2.8 01/31/2019 0940   MONOABS 0.8 07/17/2019 0829   EOSABS 0.5 07/17/2019 0829   EOSABS 0.1 01/31/2019 0940   BASOSABS 0.1 07/17/2019 0829   BASOSABS 0.1 01/31/2019 0940   CXR 07/01/20 - No acute cardiopulmonary abnormalities. No infiltrate, effusion or edema.  Imaging, labs and tests noted above have been reviewed independently by me.    Assessment & Plan:   Discussion: 64 year old male former smoker with severe COPD, OSA on BiPAP, hx of CAD s/p 3vCABG, atrial tachycardia and AAA who presents for follow-up. He is optimized from a Pulmonary standpoint however continues to have dyspnea on exertion that is associated with tachycardia. Currently undergoing cardiac evaluation with echocardiogram. During in-clinic  ambulation, patient was unable to complete 3rd lap of walk due to unsteadiness and dizziness. Test was discontinued though noted he desatted to 87%.  Shortness of breath - uncontrolled. Likely related to severe deconditioning as demonstrated with in-clinic ambulatory test. Aside from rehab, he is on all available therapies. Not a surgical candidate for lung volume reduction due to homogenous distribution of emphysema. Could consider CPET but due to his known tachycardia, this may not provide useful information and unlikely to change Pulmonary management.  Severe COPD with emphysema with chronic bronchitis CONTINUE Breztri TWO puffs TWICE a day CONTINUE Albuterol nebulizer and handheld as needed CONTINUE chronic macrolide therapy Hold on referral to Pulmonary Rehab due to patient moving to [employer]. However patient aware that I encourage to start this as soon as possible Arrange for PFTs   Exertional Hypoxemia - secondary to V/Q mismatch START supplemental oxygen. Wear 2L with activity and sleep for goal saturations >88%  OSA on CPAP Followed by another provider (Cardiology)  CT Lung Screen Due in 07/2020  Health Maintenance Immunization History  Administered Date(s) Administered  . Fluad Quad(high Dose 65+) 04/07/2020  . Influenza,inj,Quad PF,6+ Mos 04/17/2013, 06/17/2014, 05/01/2015, 03/25/2016, 04/01/2017, 07/14/2018, 03/22/2019  . PFIZER SARS-COV-2 Vaccination 04/14/2020, 05/05/2020  . Pneumococcal Conjugate-13 05/01/2015  . Pneumococcal Polysaccharide-23 06/17/2014, 08/07/2019  . Tdap 12/14/2012  . Zoster 12/23/2015   CT Lung Screen - Due 06/2020  Orders Placed This Encounter  Procedures  . DG Chest 2 View    Standing Status:   Future    Number of Occurrences:   1    Standing Expiration Date:   07/01/2021    Order Specific Question:   Reason for Exam (SYMPTOM  OR DIAGNOSIS REQUIRED)    Answer:   [facility]    Order Specific Question:   Preferred imaging location?    Answer:    Internal   No orders of the defined types were placed in this encounter.   Return in about 2 months (around 09/01/2020) for after PFTs.  I have spent a total time of 40-minutes on the day of the appointment reviewing prior documentation, coordinating care and discussing medical diagnosis and plan with the patient/family. Imaging, labs and tests included in this note have been reviewed and interpreted independently by me.  [place], MD [facility] 07/01/2020 9:16 AM  Office Number [phone]

## 2020-07-02 ENCOUNTER — Telehealth: Payer: Self-pay

## 2020-07-02 ENCOUNTER — Ambulatory Visit (HOSPITAL_COMMUNITY): Payer: PPO | Attending: Cardiology

## 2020-07-02 DIAGNOSIS — I719 Aortic aneurysm of unspecified site, without rupture: Secondary | ICD-10-CM | POA: Insufficient documentation

## 2020-07-02 DIAGNOSIS — Z951 Presence of aortocoronary bypass graft: Secondary | ICD-10-CM | POA: Diagnosis not present

## 2020-07-02 DIAGNOSIS — R002 Palpitations: Secondary | ICD-10-CM | POA: Diagnosis not present

## 2020-07-02 DIAGNOSIS — R0600 Dyspnea, unspecified: Secondary | ICD-10-CM | POA: Diagnosis not present

## 2020-07-02 DIAGNOSIS — R0609 Other forms of dyspnea: Secondary | ICD-10-CM

## 2020-07-02 LAB — ECHOCARDIOGRAM COMPLETE
Area-P 1/2: 3.92 cm2
S' Lateral: 2.9 cm

## 2020-07-02 MED ORDER — PERFLUTREN LIPID MICROSPHERE
1.0000 mL | INTRAVENOUS | Status: AC | PRN
  Administered 2020-07-02: 3 mL via INTRAVENOUS

## 2020-07-02 NOTE — Telephone Encounter (Signed)
Called patient with results of his echocardiogram and he has decided to do the monitor at the beginning of the year. Patient will call our office and get scheduled an appointment in January to see on of the monitor techs.

## 2020-07-09 ENCOUNTER — Telehealth: Payer: Self-pay | Admitting: *Deleted

## 2020-07-09 ENCOUNTER — Other Ambulatory Visit: Payer: Self-pay | Admitting: Family Medicine

## 2020-07-09 NOTE — Telephone Encounter (Signed)
Contacted patient in attempt to schedule annual lung screening scan. Patient refuses at this time due to scan not being covered 100% last year and in the middle of relocating, obtaining a new PCP, and new insurance.   Patient is assured that annual lung screenings should be covered 100% and we will look into any further bills for lung screening he would receive. Patient requests to consider in a couple months. Reviewed importance of continuing annual lung screening scans.

## 2020-07-09 NOTE — Telephone Encounter (Signed)
Pharmacy requests refill on: Pantoprazole 40 mg  LAST REFILL: 07/23/2019 LAST OV: 04/07/2020 NEXT OV: Not Scheduled  PHARMACY: [id number] [place], [place]

## 2020-07-14 ENCOUNTER — Telehealth: Payer: Self-pay | Admitting: Pulmonary Disease

## 2020-07-14 NOTE — Telephone Encounter (Signed)
Spoke with patient. He stated that he had received a message from [employer] stating that he needed to be requalified for O2. He was confused because he qualified for O2 on 07/03/20 during his visit with JE. He has attempted to call [employer] numerous times but has been placed on hold for over 30 minutes. I advised him that I would call [employer] to see exactly is going on, he verbalized understanding.   Called [employer] and spoke with [place]. She reviewed his chart and stated that the respiratory dept had called him to get him scheduled for a POC eval in their office. He will need to complete this before getting a POC. He will need to call [phone] and ask to speak with the respiratory dept to get scheduled.   And spoke with patient again. He was made aware of the above message. He will call [employer] back to get scheduled.   Nothing further needed at time of call.

## 2020-07-24 ENCOUNTER — Other Ambulatory Visit: Payer: Self-pay | Admitting: Pulmonary Disease

## 2020-07-24 DIAGNOSIS — J449 Chronic obstructive pulmonary disease, unspecified: Secondary | ICD-10-CM

## 2020-08-08 ENCOUNTER — Other Ambulatory Visit: Payer: Self-pay | Admitting: Nurse Practitioner

## 2020-08-08 ENCOUNTER — Other Ambulatory Visit: Payer: Self-pay | Admitting: Family Medicine

## 2020-08-08 NOTE — Telephone Encounter (Signed)
Spoke with pt to confirm he is still in the area since next lab visit shows c/x due to pt moved.  Says they are in the process of moving to [place] and is kind of back and forth.  But he wants to remain with Dr. Darnell Level until the move is official.    E-scribed refills.  Pt will call back to schedule wellness, lab and cpe visits.

## 2020-08-11 DIAGNOSIS — G4733 Obstructive sleep apnea (adult) (pediatric): Secondary | ICD-10-CM | POA: Diagnosis not present

## 2020-08-11 DIAGNOSIS — J449 Chronic obstructive pulmonary disease, unspecified: Secondary | ICD-10-CM | POA: Diagnosis not present

## 2020-08-12 ENCOUNTER — Encounter: Payer: Self-pay | Admitting: Family Medicine

## 2020-08-13 ENCOUNTER — Telehealth: Payer: PPO

## 2020-09-05 ENCOUNTER — Other Ambulatory Visit (HOSPITAL_COMMUNITY)
Admission: RE | Admit: 2020-09-05 | Discharge: 2020-09-05 | Disposition: A | Discharge status: Home / Self Care | Payer: Medicare HMO | Source: Ambulatory Visit | Attending: Pulmonary Disease | Admitting: Pulmonary Disease

## 2020-09-05 DIAGNOSIS — Z01812 Encounter for preprocedural laboratory examination: Secondary | ICD-10-CM | POA: Insufficient documentation

## 2020-09-05 DIAGNOSIS — Z20822 Contact with and (suspected) exposure to covid-19: Secondary | ICD-10-CM | POA: Diagnosis not present

## 2020-09-05 LAB — SARS CORONAVIRUS 2 (TAT 6-24 HRS): SARS Coronavirus 2: NEGATIVE

## 2020-09-08 ENCOUNTER — Other Ambulatory Visit: Payer: Self-pay

## 2020-09-08 ENCOUNTER — Encounter: Payer: Self-pay | Admitting: Pulmonary Disease

## 2020-09-08 ENCOUNTER — Ambulatory Visit (INDEPENDENT_AMBULATORY_CARE_PROVIDER_SITE_OTHER): Payer: Medicare HMO | Admitting: Pulmonary Disease

## 2020-09-08 ENCOUNTER — Ambulatory Visit: Payer: Medicare HMO | Admitting: Pulmonary Disease

## 2020-09-08 VITALS — BP 142/68 | HR 78 | Temp 97.7°F | Ht 70.0 in | Wt 196.8 lb

## 2020-09-08 DIAGNOSIS — J441 Chronic obstructive pulmonary disease with (acute) exacerbation: Secondary | ICD-10-CM | POA: Diagnosis not present

## 2020-09-08 DIAGNOSIS — R0602 Shortness of breath: Secondary | ICD-10-CM

## 2020-09-08 LAB — BASIC METABOLIC PANEL
BUN: 21 mg/dL (ref 6–23)
CO2: 28 mEq/L (ref 19–32)
Calcium: 9.4 mg/dL (ref 8.4–10.5)
Chloride: 101 mEq/L (ref 96–112)
Creatinine, Ser: 0.84 mg/dL (ref 0.40–1.50)
GFR: 90.91 mL/min (ref >60.00)
Glucose, Bld: 103 mg/dL — ABNORMAL HIGH (ref 70–99)
Potassium: 3.8 mEq/L (ref 3.5–5.1)
Sodium: 139 mEq/L (ref 135–145)

## 2020-09-08 LAB — PULMONARY FUNCTION TEST
DL/VA % pred: 39 %
DL/VA: 1.61 ml/min/mmHg/L
DLCO cor % pred: 34 %
DLCO cor: 9.29 ml/min/mmHg
DLCO unc % pred: 34 %
DLCO unc: 9.29 ml/min/mmHg
FEF 25-75 Post: 0.78 L/sec
FEF 25-75 Pre: 0.59 L/sec
FEF2575-%Change-Post: 30 %
FEF2575-%Pred-Post: 29 %
FEF2575-%Pred-Pre: 22 %
FEV1-%Change-Post: 9 %
FEV1-%Pred-Post: 50 %
FEV1-%Pred-Pre: 46 %
FEV1-Post: 1.73 L
FEV1-Pre: 1.58 L
FEV1FVC-%Change-Post: 0 %
FEV1FVC-%Pred-Pre: 64 %
FEV6-%Change-Post: 8 %
FEV6-%Pred-Post: 76 %
FEV6-%Pred-Pre: 70 %
FEV6-Post: 3.32 L
FEV6-Pre: 3.07 L
FEV6FVC-%Change-Post: 0 %
FEV6FVC-%Pred-Post: 98 %
FEV6FVC-%Pred-Pre: 98 %
FVC-%Change-Post: 8 %
FVC-%Pred-Post: 77 %
FVC-%Pred-Pre: 71 %
FVC-Post: 3.55 L
FVC-Pre: 3.27 L
Post FEV1/FVC ratio: 49 %
Post FEV6/FVC ratio: 94 %
Pre FEV1/FVC ratio: 48 %
Pre FEV6/FVC Ratio: 94 %
RV % pred: 134 %
RV: 3.17 L
TLC % pred: 100 %
TLC: 7.07 L

## 2020-09-08 MED ORDER — PREDNISONE 10 MG PO TABS: ORAL_TABLET | ORAL | 0 refills | Status: AC

## 2020-09-08 MED ORDER — AZITHROMYCIN 250 MG PO TABS: 250.0000 mg | ORAL_TABLET | Freq: Every day | ORAL | 4 refills | Status: AC

## 2020-09-08 MED ORDER — BREZTRI AEROSPHERE 160-9-4.8 MCG/ACT IN AERO: 2.0000 | INHALATION_SPRAY | Freq: Two times a day (BID) | RESPIRATORY_TRACT | 3 refills | Status: AC

## 2020-09-08 NOTE — Patient Instructions (Signed)
Severe COPD with emphysema with chronic bronchitis with exacerbation CONTINUE Breztri TWO puffs TWICE a day CONTINUE Albuterol nebulizer and handheld as needed CONTINUE chronic macrolide therapy START prednisone 40 mg x 5 days Hold on referral to Pulmonary Rehab due to patient moving to [employer]. However patient aware that I encourage to start this as soon as possible  Exertional Hypoxemia - secondary to V/Q mismatch CONTINUE supplemental continuous oxygen. Wear 2L with activity and sleep for goal saturations >88% Does not qualify for pulse oxygen  Leg cramping ORDER BMET Recommend conservative management including hydration, deep tissue/athletic massage, and heating pads  Follow-up with me with in 3 months

## 2020-09-08 NOTE — Progress Notes (Signed)
PFT done today. 

## 2020-09-08 NOTE — Progress Notes (Signed)
Subjective:   PATIENT ID: Carl Lee GENDER: male DOB: 1956/05/01, MRN: [id number]   HPI  Chief Complaint  Patient presents with  . Follow-up    Had PFT today-sob worse at times, cough-green/gray,slight wheezing    Reason for Visit: Follow-up   Mr. Ordean Fouts is a 65 year old male former smoker with moderate COPD, OSA on BiPAP, history of CAD status post three-vessel CABG, atrial tachycardia and AAA who presents for follow-up  He has been compliant with his Breztri and chronic macrolide therapy. He was able to mow the yard last week. However in the last 3-4 days he has had worsening shortness of breath, wheezing and productive cough with green sputum. He reports oxygen ranging in the 70s. He is doing better now. He will use up to 3 times a day when feeling bad. He has short of breath with exertion first thing in the morning. But will improve after the first 30 minutes after he has cleared up with coughing. Has sinus issues in the morning. He reports cramping.    For his CPAP, he has been using his oxygen and increased 2-3 L for the bleed in.   Inhalers Tried Trelegy (thrush)  Social History: Former smoker. 44 pack- years. Quit in 2015  I have personally reviewed patient's past medical/family/social history/allergies/current medications.  Past Medical History:  Diagnosis Date  . AAA (abdominal aortic aneurysm) ([place]) 03/25/12   s/p endovascular repair  . Adenomatous polyp 06/2009  . Atrial tachycardia, paroxysmal ([place]) 06/18/2016  . Bladder cancer ([place]) 08/16/2008   recurrence 2015 and 2018 treated with office fulguration  . CAD (coronary artery disease), native coronary artery    S/p 3v CABG 01/2014   . Cataract 2019   bilateral eyes  . Childhood asthma    as a child  . Complication of anesthesia    "takes me a year to get over"  . Contact dermatitis    atypical [name])  . Controlled diabetes mellitus type 2 with complications ([place]) 26/33/3545   CAD   . COPD  (chronic obstructive pulmonary disease) ([place])   . Coronary artery disease    a.2015: CABG w/ LIMA-LAD, RIMA-OM, SVG-RCA b. Cath on 07/10/15 w/ patent LIMA-LAD and RIMA-OM. SVG-RCA occluded but collaterals present  . Diverticulosis 2013   severe by CT and colonoscopy  . Environmental allergies    improved as ages  . Fracture of lateral malleolus of left ankle 08/29/2013  . Headache    hasn't had migraines in years  . Hepatic steatosis 06/2015   by [place]  . Hx of migraines   . Hypertension   . Lactose intolerance 11/14/2018   Endorses for years - avoids dairy products  . Lumbosacral radiculopathy at S1 03/2012   left, with spinal stenosis (MRI 04/2013) improved with TF ESI L5/S1 and S1/2 ([facility])  . OSA (obstructive sleep apnea) 09/04/2014   Severe with AHI 35/hr, uses BIPAP  . Overweight (BMI 25.0-29.9) 01/26/2017  . Resistance to clopidogrel 2015   drug metabolism panel run - asked to scan  . Spinal stenosis    LS1 nerve root impingement from bulging disc  . Vitamin B12 deficiency   . Zenker diverticulum      Allergies  Allergen Reactions  . Codeine Other (See Comments)    Nightmares  . Doxycycline Hives and Other (See Comments)    mild     Outpatient Medications Prior to Visit  Medication Sig Dispense Refill  . acetaminophen (TYLENOL) 325 MG tablet Take  2 tablets (650 mg total) by mouth every 6 (six) hours as needed for moderate pain or headache. 60 tablet 0  . albuterol (VENTOLIN HFA) 108 (90 Base) MCG/ACT inhaler TAKE 2 PUFFS BY MOUTH EVERY 6 HOURS AS NEEDED FOR WHEEZE OR SHORTNESS OF BREATH 8.5 each 5  . amLODipine (NORVASC) 5 MG tablet TAKE 1 TABLET BY MOUTH EVERY DAY 90 tablet 0  . aspirin EC 81 MG tablet Take 1 tablet (81 mg total) by mouth daily. 90 tablet 1  . atorvastatin (LIPITOR) 20 MG tablet TAKE 1 TABLET (20 MG TOTAL) BY MOUTH DAILY AT 6 PM. 90 tablet 0  . azithromycin (ZITHROMAX) 250 MG tablet Take 1 tablet (250 mg total) by mouth daily. 30 tablet 4  .  Budeson-Glycopyrrol-Formoterol (BREZTRI AEROSPHERE) 160-9-4.8 MCG/ACT AERO Inhale 2 puffs into the lungs in the morning and at bedtime. 32.1 g 1  . cholecalciferol (VITAMIN D3) 25 MCG (1000 UT) tablet Take 1,000 Units by mouth daily.    . famotidine (PEPCID) 20 MG tablet TAKE 1 TABLET BY MOUTH TWICE A DAY 180 tablet 0  . glucose blood test strip CHECK ONCE DAILY AND AS NEEDED    . glucose blood test strip CHECK ONCE DAILY AND AS NEEDED    . ipratropium-albuterol (DUONEB) 0.5-2.5 (3) MG/3ML SOLN TAKE 3 MLS BY NEBULIZATION EVERY 6 (SIX) HOURS AS NEEDED FOR UP TO 30 DAYS. 360 mL 11  . ketotifen (ZADITOR) 0.025 % ophthalmic solution Place 1 drop into both eyes daily as needed (allergies). 5 mL 1  . metoprolol tartrate (LOPRESSOR) 25 MG tablet Take 1 tablet (25 mg total) by mouth 2 (two) times daily. 180 tablet 3  . oxyCODONE (OXY IR/ROXICODONE) 5 MG immediate release tablet Take 5 mg by mouth every 4 (four) hours as needed.    . pantoprazole (PROTONIX) 40 MG tablet TAKE 1 TABLET BY MOUTH EVERY DAY 90 tablet 1  . vitamin B-12 (CYANOCOBALAMIN) 500 MCG tablet Take 1 tablet (500 mcg total) by mouth daily.     No facility-administered medications prior to visit.    Review of Systems  Constitutional: Negative for chills, diaphoresis, fever, malaise/fatigue and weight loss.  HENT: Negative for congestion.   Respiratory: Positive for cough, shortness of breath and wheezing. Negative for hemoptysis and sputum production.   Cardiovascular: Negative for chest pain, palpitations and leg swelling.   Objective:   Vitals:   09/08/20 1205  BP: (!) 142/68  Pulse: 78  Temp: 97.7 F (36.5 C)  TempSrc: Temporal  SpO2: 96%  Weight: 196 lb 13.6 oz (89.3 kg)  Height: 5\' 10"  (1.778 m)   SpO2: 96 % O2 Device: None (Room air)   Physical Exam: General: Well-appearing, no acute distress HENT: [place], AT Eyes: EOMI, no scleral icterus Respiratory: Clear to auscultation bilaterally.  No crackles, wheezing or  rales Cardiovascular: RRR, -M/R/G, no JVD Extremities:-Edema,-tenderness Neuro: AAO x4, CNII-XII grossly intact Skin: Intact, no rashes or bruising Psych: Normal mood, normal affect  Data Reviewed:  Imaging: CT Chest Lung Screen 07/20/19 - Centrilobular and paraseptal emphysema. Tiny pulmonary nodules. CXR 07/01/20 - No acute cardiopulmonary abnormalities. No infiltrate, effusion or edema.  PFT: 11/29/17 FVC 3.0 (66%) FEV1 1.7 (49%) Ratio 56  Interpretation: Severe obstructive defect present  09/08/20 FVC 3.55 (77%) FEV1 1.73 (50%) Ratio 48  TLC 100% DLCO 34% Interpretation: Moderately severe obstructive defect. Elevated RV/TLC. Severe reduction in gas exchange. The following findings consistent with emphysema  Imaging, labs and test noted above have been reviewed independently by  me.  Assessment & Plan:   Discussion: 65 year old male former smoker with severe COPD, OSA on BiPAP, hx of CAD s/p 3vCABG, atrial tachycardia and AAA who presents for follow-up. He is optimized from a Pulmonary standpoint however continues to have dyspnea on exertion that is associated with tachycardia. Reviewed his PFTs which demonstrate interval worsening of his gas exchange. This finding is consistent with his recent diagnosis of hypoxemia requiring supplemental oxygen. This could be related to worsening emphysema  Shortness of breath - uncontrolled. Likely related to severe deconditioning as demonstrated with in-clinic ambulatory test. Aside from rehab, he is on all available therapies. Not a surgical candidate for lung volume reduction due to homogenous distribution of emphysema. Could consider CPET but due to his known tachycardia, this may not provide useful information and unlikely to change Pulmonary management.  Severe COPD with emphysema with chronic bronchitis with exacerbation START prednisone 40 mg x 5 days CONTINUE Breztri TWO puffs TWICE a day CONTINUE Albuterol nebulizer and handheld as  needed CONTINUE chronic macrolide therapy CONTINUE chronic macrolide therapy. EKG showed QTc 414 Hold on referral to Pulmonary Rehab due to patient moving to [place]. However patient aware that I encourage to start this as soon as possible  Exertional Hypoxemia - secondary to V/Q mismatch CONTINUE supplemental oxygen. Wear 2L with activity and sleep for goal saturations >88% Does not qualify for pulse oxygen  Leg cramping ORDER BMET Recommend conservative management including deep tissue/athletic massage and heating pads  OSA on CPAP Followed by another provider (Cardiology)  CT Lung Screen Due in 07/2020  Health Maintenance Immunization History  Administered Date(s) Administered  . Fluad Quad(high Dose 65+) 04/07/2020  . Influenza,inj,Quad PF,6+ Mos 04/17/2013, 06/17/2014, 05/01/2015, 03/25/2016, 04/01/2017, 07/14/2018, 03/22/2019  . PFIZER([place])SARS-COV-2 Vaccination 04/14/2020, 05/05/2020  . Pneumococcal Conjugate-13 05/01/2015  . Pneumococcal Polysaccharide-23 06/17/2014, 08/07/2019  . Tdap 12/14/2012  . Zoster 12/23/2015   CT Lung Screen - Due 07/2020  Orders Placed This Encounter  Procedures  . Basic Metabolic Panel (BMET)    Standing Status:   Future    Number of Occurrences:   1    Standing Expiration Date:   09/08/2021  . EKG 12-Lead   Meds ordered this encounter  Medications  . predniSONE (DELTASONE) 10 MG tablet    Sig: Take 40 mg daily for 5 days by mouth.    Dispense:  20 tablet    Refill:  0  . azithromycin (ZITHROMAX) 250 MG tablet    Sig: Take 1 tablet (250 mg total) by mouth daily.    Dispense:  30 tablet    Refill:  4  . Budeson-Glycopyrrol-Formoterol (BREZTRI AEROSPHERE) 160-9-4.8 MCG/ACT AERO    Sig: Inhale 2 puffs into the lungs in the morning and at bedtime.    Dispense:  32.1 g    Refill:  3    Order Specific Question:   Lot Number?    Answer:   [id number]    Order Specific Question:   Expiration Date?    Answer:   10/08/2021     Order Specific Question:   Manufacturer?    Answer:   [employer] [[id number]]    Order Specific Question:   Quantity    Answer:   3    Return in about 3 months (around 12/06/2020).  I have spent a total time of 31-minutes on the day of the appointment reviewing prior documentation, coordinating care and discussing medical diagnosis and plan with the patient/family. Imaging, labs and tests included in  this note have been reviewed and interpreted independently by me.  [place], MD [facility] 09/08/2020 12:25 PM  Office Number [phone]

## 2020-09-17 ENCOUNTER — Encounter: Payer: Self-pay | Admitting: Pulmonary Disease

## 2020-10-30 ENCOUNTER — Other Ambulatory Visit: Payer: Self-pay | Admitting: Family Medicine

## 2020-11-04 ENCOUNTER — Other Ambulatory Visit: Payer: Self-pay | Admitting: Family Medicine

## 2020-11-15 ENCOUNTER — Other Ambulatory Visit: Payer: Self-pay | Admitting: Family Medicine

## 2020-12-03 ENCOUNTER — Encounter: Payer: Self-pay | Admitting: Pulmonary Disease

## 2020-12-11 ENCOUNTER — Other Ambulatory Visit: Payer: Self-pay | Admitting: Family Medicine

## 2020-12-19 ENCOUNTER — Other Ambulatory Visit: Payer: Self-pay | Admitting: Family Medicine

## 2020-12-19 NOTE — Telephone Encounter (Signed)
Per pt, he moved.

## 2021-01-02 ENCOUNTER — Other Ambulatory Visit: Payer: Self-pay | Admitting: Family Medicine

## 2021-01-03 ENCOUNTER — Other Ambulatory Visit: Payer: Self-pay | Admitting: Family Medicine

## 2021-01-08 ENCOUNTER — Other Ambulatory Visit: Payer: Self-pay | Admitting: Family Medicine

## 2021-01-24 ENCOUNTER — Other Ambulatory Visit: Payer: Self-pay | Admitting: Family Medicine

## 2021-04-03 ENCOUNTER — Telehealth: Payer: Self-pay

## 2021-04-03 NOTE — Chronic Care Management (AMB) (Addendum)
    Chronic Care Management Pharmacy Assistant   Name: Carl Lee  MRN: [id number] DOB: September 14, 1955   Reason for Encounter: General Adherence Review   Recent office visits:  None with Dr.Gutierrez in the past 6 months  Recent consult visits:  01/27/21-Telemedicine [facility]-start prednisone taper 10mg ,benzonatate 200mg  take 1 capsule 3 times daily,Avelox 400mg  take 1 tablet daily for 7 days. 11/27/20-[facility]-Patient presented to establish care,new patient visit.  Hospital visits:  None in previous 6 months  Medications: Outpatient Encounter Medications as of 04/03/2021  Medication Sig   acetaminophen (TYLENOL) 325 MG tablet Take 2 tablets (650 mg total) by mouth every 6 (six) hours as needed for moderate pain or headache.   albuterol (VENTOLIN HFA) 108 (90 Base) MCG/ACT inhaler TAKE 2 PUFFS BY MOUTH EVERY 6 HOURS AS NEEDED FOR WHEEZE OR SHORTNESS OF BREATH   amLODipine (NORVASC) 5 MG tablet TAKE 1 TABLET BY MOUTH EVERY DAY   aspirin EC 81 MG tablet Take 1 tablet (81 mg total) by mouth daily.   atorvastatin (LIPITOR) 20 MG tablet TAKE 1 TABLET (20 MG TOTAL) BY MOUTH DAILY AT 6 PM.   azithromycin (ZITHROMAX) 250 MG tablet Take 1 tablet (250 mg total) by mouth daily.   Budeson-Glycopyrrol-Formoterol (BREZTRI AEROSPHERE) 160-9-4.8 MCG/ACT AERO Inhale 2 puffs into the lungs in the morning and at bedtime.   cholecalciferol (VITAMIN D3) 25 MCG (1000 UT) tablet Take 1,000 Units by mouth daily.   famotidine (PEPCID) 20 MG tablet TAKE 1 TABLET BY MOUTH TWICE A DAY   glucose blood test strip CHECK ONCE DAILY AND AS NEEDED   glucose blood test strip CHECK ONCE DAILY AND AS NEEDED   ipratropium-albuterol (DUONEB) 0.5-2.5 (3) MG/3ML SOLN TAKE 3 MLS BY NEBULIZATION EVERY 6 (SIX) HOURS AS NEEDED FOR UP TO 30 DAYS.   ketotifen (ZADITOR) 0.025 % ophthalmic solution Place 1 drop into both eyes daily as needed (allergies).   metoprolol tartrate (LOPRESSOR) 25 MG  tablet Take 1 tablet (25 mg total) by mouth 2 (two) times daily.   oxyCODONE (OXY IR/ROXICODONE) 5 MG immediate release tablet Take 5 mg by mouth every 4 (four) hours as needed.   pantoprazole (PROTONIX) 40 MG tablet TAKE 1 TABLET BY MOUTH EVERY DAY   predniSONE (DELTASONE) 10 MG tablet Take 40 mg daily for 5 days by mouth.   vitamin B-12 (CYANOCOBALAMIN) 500 MCG tablet Take 1 tablet (500 mcg total) by mouth daily.   No facility-administered encounter medications on file as of 04/03/2021.    Contacted Tayshaun Kroh [facility] on 04/03/21 for general disease state and medication adherence call.  The patient informed me of his current medical treatment with [facility] and wished to withdrawal from the CCM program at this time .  Patient is > 5 days past due for refill on the following medications per chart history:  [facility] Medications: Medication Name/mg Last Fill Days Supply Atorvastatin 20mg   08/08/20 90  Care Gaps: Annual wellness visit in last year? No  07/23/2019 Most Recent BP reading: 142/68  78-P  09/08/20  No appointments scheduled within the next 30 days.  Debbora Dus, CPP notified  Avel Sensor, [place] Assistant [phone]  I have reviewed the care management and care coordination activities outlined in this encounter and I am certifying that I agree with the content of this note. No further action required.  Debbora Dus, PharmD Clinical Pharmacist [facility] [phone]

## 2021-04-14 ENCOUNTER — Other Ambulatory Visit: Payer: Self-pay | Admitting: Family Medicine

## 2021-05-04 ENCOUNTER — Telehealth: Payer: Self-pay

## 2021-05-04 NOTE — Telephone Encounter (Signed)
Transition Care Management Follow-up Telephone Call Date of discharge and from where: 04/29/21 from [facility]  How have you been since you were released from the hospital? "I'm doing pretty good" Any questions or concerns? No  Items Reviewed: Did the pt receive and understand the discharge instructions provided? Yes  Medications obtained and verified? Yes  Other? No  Any new allergies since your discharge? No  Dietary orders reviewed? No Do you have support at home? Yes   Home Care and Equipment/Supplies: Were home health services ordered? no If so, what is the name of the agency? Not applicable  Has the agency set up a time to come to the patient's home? not applicable Were any new equipment or medical supplies ordered?  No What is the name of the medical supply agency? No Were you able to get the supplies/equipment? not applicable Do you have any questions related to the use of the equipment or supplies? No  Functional Questionnaire: (I = Independent and D = Dependent) ADLs: I  Bathing/Dressing- I  Meal Prep- I  Eating- I  Maintaining continence- I  Transferring/Ambulation- I  Managing Meds- I  Follow up appointments reviewed:  PCP Hospital f/u appt confirmed?  Patient states PCP changed  Has follow up scheduled with Dr. Lawana Pai [place]. [facility] f/u appt confirmed? Yes  has already seen Cardiologist Dr. Lynelle Doctor 04/30/21. Are transportation arrangements needed? No  If their condition worsens, is the pt aware to call PCP or go to the Emergency Dept.? Yes Was the patient provided with contact information for the PCP's office or ED? Yes Was to pt encouraged to call back with questions or concerns? Yes

## 2022-01-18 ENCOUNTER — Other Ambulatory Visit: Payer: Self-pay | Admitting: *Deleted

## 2022-01-18 NOTE — Patient Outreach (Addendum)
  Care Coordination TOC Note  Transition Care Management Follow-up Telephone Call Date of discharge and from where: 01/15/22 [place] How have you been since you were released from the hospital? [place] Any questions or concerns? No  Items Reviewed: Did the pt receive and understand the discharge instructions provided? Yes  Medications obtained and verified? Yes  Other? No  Any new allergies since your discharge? No  Dietary orders reviewed? Yes Do you have support at home? Yes   Home Care and Equipment/Supplies: Were home health services ordered? no If so, what is the name of the agency? NA  Has the agency set up a time to come to the patient's home? not applicable Were any new equipment or medical supplies ordered?  No What is the name of the medical supply agency? NA Were you able to get the supplies/equipment? not applicable Do you have any questions related to the use of the equipment or supplies? No  Functional Questionnaire: (I = Independent and D = Dependent) ADLs: I  Bathing/Dressing- I  Meal Prep- I  Eating- I  Maintaining continence- I  Transferring/Ambulation- I  Managing Meds- I  Follow up appointments reviewed:  PCP Hospital f/u appt confirmed? Yes  Scheduled to see DR. MCDONALD on 7/17 @ 0815. [facility] f/u appt confirmed? Yes  Scheduled to see CARDIOLOGIST on 7/13 @ 1130. Are transportation arrangements needed? No  If their condition worsens, is the pt aware to call PCP or go to the Emergency Dept.? Yes Was the patient provided with contact information for the PCP's office or ED? Yes Was to pt encouraged to call back with questions or concerns? Yes  SDOH assessments and interventions completed:   No  Care Coordination Interventions Activated:  No Care Coordination Interventions:   PCP is in [place], Dr. Marcha Solders, not in system to update.  States he has not seen, Dr. Danise Mina in about 2 years.  Encounter  Outcome:  Pt. Visit Completed   Hubert Azure RN, MSN RN Care Management Coordinator  [phone] [email or web address]

## 2022-08-06 ENCOUNTER — Telehealth (HOSPITAL_COMMUNITY): Payer: Self-pay

## 2022-08-06 NOTE — Telephone Encounter (Signed)
Attempted to schedule recall, patient has moved away from the area, canceling recall, patient is welcome to call back if they move back to the area and want to schedule appointment.
